# Patient Record
Sex: Male | Born: 1945 | Race: White | Hispanic: No | Smoking: Former smoker
Health system: Southern US, Community
[De-identification: ages and names within clinical notes are randomized; demographics above are authoritative.]

## PROBLEM LIST (undated history)

## (undated) DIAGNOSIS — M797 Fibromyalgia: Secondary | ICD-10-CM

## (undated) DIAGNOSIS — K219 Gastro-esophageal reflux disease without esophagitis: Secondary | ICD-10-CM

## (undated) DIAGNOSIS — E785 Hyperlipidemia, unspecified: Secondary | ICD-10-CM

## (undated) DIAGNOSIS — I10 Essential (primary) hypertension: Secondary | ICD-10-CM

## (undated) DIAGNOSIS — G4733 Obstructive sleep apnea (adult) (pediatric): Secondary | ICD-10-CM

## (undated) DIAGNOSIS — K589 Irritable bowel syndrome without diarrhea: Secondary | ICD-10-CM

## (undated) DIAGNOSIS — R7309 Other abnormal glucose: Secondary | ICD-10-CM

## (undated) DIAGNOSIS — E291 Testicular hypofunction: Secondary | ICD-10-CM

## (undated) DIAGNOSIS — N400 Enlarged prostate without lower urinary tract symptoms: Secondary | ICD-10-CM

## (undated) DIAGNOSIS — C801 Malignant (primary) neoplasm, unspecified: Secondary | ICD-10-CM

## (undated) HISTORY — DX: Hyperlipidemia, unspecified: E78.5

## (undated) HISTORY — DX: Gastro-esophageal reflux disease without esophagitis: K21.9

## (undated) HISTORY — DX: Benign prostatic hyperplasia without lower urinary tract symptoms: N40.0

## (undated) HISTORY — DX: Other abnormal glucose: R73.09

## (undated) HISTORY — DX: Obstructive sleep apnea (adult) (pediatric): G47.33

## (undated) HISTORY — PX: COLONOSCOPY: SHX174

## (undated) HISTORY — DX: Testicular hypofunction: E29.1

## (undated) HISTORY — DX: Fibromyalgia: M79.7

## (undated) HISTORY — DX: Malignant (primary) neoplasm, unspecified: C80.1

## (undated) HISTORY — DX: Essential (primary) hypertension: I10

## (undated) HISTORY — DX: Irritable bowel syndrome, unspecified: K58.9

---

## 1981-10-06 HISTORY — PX: VASECTOMY: SHX75

## 1983-10-07 HISTORY — PX: OTHER SURGICAL HISTORY: SHX169

## 1997-10-06 HISTORY — PX: KNEE ARTHROSCOPY: SHX127

## 2002-06-08 ENCOUNTER — Encounter: Payer: Self-pay | Admitting: Internal Medicine

## 2002-06-08 ENCOUNTER — Ambulatory Visit (HOSPITAL_COMMUNITY): Admission: RE | Admit: 2002-06-08 | Discharge: 2002-06-08 | Payer: Self-pay | Admitting: Internal Medicine

## 2002-06-13 ENCOUNTER — Encounter: Payer: Self-pay | Admitting: Internal Medicine

## 2002-06-13 ENCOUNTER — Ambulatory Visit (HOSPITAL_COMMUNITY): Admission: RE | Admit: 2002-06-13 | Discharge: 2002-06-13 | Payer: Self-pay | Admitting: Internal Medicine

## 2002-09-07 ENCOUNTER — Ambulatory Visit (HOSPITAL_COMMUNITY): Admission: RE | Admit: 2002-09-07 | Discharge: 2002-09-07 | Payer: Self-pay | Admitting: Gastroenterology

## 2002-12-09 ENCOUNTER — Encounter: Payer: Self-pay | Admitting: Internal Medicine

## 2002-12-09 ENCOUNTER — Ambulatory Visit (HOSPITAL_COMMUNITY): Admission: RE | Admit: 2002-12-09 | Discharge: 2002-12-09 | Payer: Self-pay | Admitting: Internal Medicine

## 2003-10-30 ENCOUNTER — Encounter: Admission: RE | Admit: 2003-10-30 | Discharge: 2003-10-30 | Payer: Self-pay | Admitting: Cardiology

## 2003-11-02 ENCOUNTER — Ambulatory Visit (HOSPITAL_COMMUNITY): Admission: RE | Admit: 2003-11-02 | Discharge: 2003-11-02 | Payer: Self-pay | Admitting: Cardiology

## 2005-10-06 HISTORY — PX: SPINE SURGERY: SHX786

## 2006-06-12 ENCOUNTER — Emergency Department (HOSPITAL_COMMUNITY): Admission: EM | Admit: 2006-06-12 | Discharge: 2006-06-12 | Payer: Self-pay | Admitting: Emergency Medicine

## 2006-06-16 ENCOUNTER — Ambulatory Visit (HOSPITAL_COMMUNITY): Admission: RE | Admit: 2006-06-16 | Discharge: 2006-06-16 | Payer: Self-pay | Admitting: Internal Medicine

## 2006-06-22 ENCOUNTER — Ambulatory Visit (HOSPITAL_COMMUNITY): Admission: RE | Admit: 2006-06-22 | Discharge: 2006-06-23 | Payer: Self-pay | Admitting: Neurosurgery

## 2008-09-28 ENCOUNTER — Emergency Department (HOSPITAL_COMMUNITY): Admission: EM | Admit: 2008-09-28 | Discharge: 2008-09-28 | Payer: Self-pay | Admitting: Emergency Medicine

## 2009-08-27 ENCOUNTER — Ambulatory Visit: Payer: Self-pay | Admitting: Physical Medicine & Rehabilitation

## 2009-08-27 ENCOUNTER — Encounter
Admission: RE | Admit: 2009-08-27 | Discharge: 2009-09-26 | Payer: Self-pay | Admitting: Physical Medicine & Rehabilitation

## 2009-09-10 ENCOUNTER — Encounter
Admission: RE | Admit: 2009-09-10 | Discharge: 2009-10-03 | Payer: Self-pay | Admitting: Physical Medicine & Rehabilitation

## 2009-09-13 ENCOUNTER — Ambulatory Visit: Payer: Self-pay | Admitting: Physical Medicine & Rehabilitation

## 2009-10-08 ENCOUNTER — Encounter
Admission: RE | Admit: 2009-10-08 | Discharge: 2009-10-20 | Payer: Self-pay | Admitting: Physical Medicine & Rehabilitation

## 2009-10-11 ENCOUNTER — Encounter
Admission: RE | Admit: 2009-10-11 | Discharge: 2010-01-09 | Payer: Self-pay | Admitting: Physical Medicine & Rehabilitation

## 2009-10-12 ENCOUNTER — Ambulatory Visit: Payer: Self-pay | Admitting: Physical Medicine & Rehabilitation

## 2009-11-09 ENCOUNTER — Ambulatory Visit: Payer: Self-pay | Admitting: Physical Medicine & Rehabilitation

## 2010-01-08 ENCOUNTER — Encounter
Admission: RE | Admit: 2010-01-08 | Discharge: 2010-01-08 | Payer: Self-pay | Admitting: Physical Medicine & Rehabilitation

## 2010-01-14 ENCOUNTER — Ambulatory Visit: Payer: Self-pay | Admitting: Physical Medicine & Rehabilitation

## 2010-04-12 ENCOUNTER — Encounter
Admission: RE | Admit: 2010-04-12 | Discharge: 2010-04-12 | Payer: Self-pay | Admitting: Physical Medicine & Rehabilitation

## 2011-02-21 NOTE — Op Note (Signed)
   NAME:  Cody Hines, Cody Hines                        ACCOUNT NO.:  [id number]   MEDICAL RECORD NO.:  [id number]                   PATIENT TYPE:  AMB   LOCATION:  ENDO                                 FACILITY:  [facility]   PHYSICIAN:  John C. Madilyn Fireman, M.D.                 DATE OF BIRTH:  1946-05-21   DATE OF PROCEDURE:  DATE OF DISCHARGE:                                 OPERATIVE REPORT   PROCEDURE:  Colonoscopy.   INDICATIONS FOR PROCEDURE:  Heme positive stools and occasional rectal  bleeding.   DESCRIPTION OF PROCEDURE:  The patient was placed in the left lateral  decubitus position then placed on the pulse monitor with continuous low flow  oxygen delivered by nasal cannula. He was sedated with 62.5 mcg IV fentanyl  and 6 mg IV Versed. The Olympus video colonoscope was inserted into the  rectum and advanced to the cecum, confirmed by transillumination at  McBurney's point and visualization of the ileocecal valve and appendiceal  orifice. The prep was excellent. The cecum, ascending, transverse,  descending and sigmoid colon all appeared normal with no masses, polyps,  diverticula or other mucosal abnormalities. The rectum likewise appeared  normal and retroflexed view of the anus did reveal some small internal  hemorrhoids. The colonoscope was then withdrawn and the patient returned to  the recovery room in stable condition. The patient tolerated the procedure  well and there were no immediate complications.   IMPRESSION:  Internal hemorrhoids, otherwise, normal colonoscopy.   PLAN:  1. Treat hemorrhoids symptomatically.  2. Flexible sigmoidoscopy in five years.                                                John C. Madilyn Fireman, M.D.    JCH/MEDQ  D:  09/07/2002  T:  09/07/2002  Job:  [id number]   cc:   Lucky Cowboy, M.D.  [address], [address]  [place], [place] [address]  Fax: [phone]

## 2011-02-21 NOTE — Cardiovascular Report (Signed)
NAME:  Cody Hines, MORIMOTO                        ACCOUNT NO.:  [id number]   MEDICAL RECORD NO.:  [id number]                   PATIENT TYPE:  OIB   LOCATION:  2861                                 FACILITY:  [facility]   PHYSICIAN:  Thereasa Solo. Little, M.D.              DATE OF BIRTH:  August 03, 1946   DATE OF PROCEDURE:  11/02/2003  DATE OF DISCHARGE:                              CARDIAC CATHETERIZATION   PROCEDURES PERFORMED:   CARDIOLOGIST:  Gaspar Garbe B. Little, M.D.   INDICATIONS FOR PROCEDURE:  Mr. Labrosse is a 65 year old male who has had  exertional chest tightness and increasing fatigue.  He had a Cardiolite  study performed at [facility] that was read by Dr. Dietrich Pates.  It  showed normal LV function and a small area of basilar inferior wall  ischemia.  Because of his symptoms and this abnormality on his nuclear study  arrangements were made for cardiac catheterization.   DESCRIPTION OF PROCEDURE:  After obtaining informed consent the patient was  prepped and draped in the usual sterile fashion exposing the right groin.  Following local anesthetic with 1% Xylocaine th Seldinger technique was  employed and a 5 [place] introducer sheath was placed into the right femoral  artery.  Left and right coronary arteriography and ventriculography in the  RAO projection were performed.   COMPLICATIONS:  None.   CONTRAST USED:  Total contrast; 125 mL.   EQUIPMENT:  Five French Judkins configuration catheters.   RESULTS:   I HEMODYNAMIC MONITORING:  Central aortic pressure was 105/63.  Left  ventricular pressure was 106/7.  No significant gradient was noted at the  time of pullback across the aortic valve.   II VENTRICULOGRAPHY:  Ventriculography in the RAO projection using 25 mL of  contrast at 12 mL per second revealed normal LV systolic function.  The  ejection fraction was greater than 65%.  There was mitral valve prolapse  without mitral regurgitation.  The left ventricular end  diastolic pressure  was normal at 14.   III CORONARY ARTERIOGRAPHY:  On fluoroscopy no calcification was noted.   1. Left Main:  The left main was short and normal.   1. Circumflex:  The circumflex was a dominant left system that supplied the     PDA.  The entire vessel was around 4 mm in diameter.  It gave rise to a     very large first and third OM vessel, and a medium-sized OM #2.  All     these vessels were free of disease.  The posterior descending artery     supplied the entire inferior wall; was free of disease and was about 3 mm     in diameter.   1. LAD:  The LAD crossed the apex of the heart.  It had only minimal     irregularities in the proximal segment and was free of any significant     disease.  There was a large first diagonal branch that bifurcated and the     entire LAD system was about 3.5 mm in diameter.   1. Right Coronary Artery:  A small, nondominant vessel just supplying the     right ventricular free wall.   CONCLUSIONS:  1. Mitral valve prolapse.  2. Normal left ventricular systolic function.  3. No coronary artery disease.   DISCUSSION:  It appears that the stress test is a false positive study.  I  cannot explain any ischemia on the nuclear study based on his cardiac  anatomy.  He does have mitral valve prolapse, which could explain the EKG  changes that occurred with the stress test.                                               Thereasa Solo. Little, M.D.    ABL/MEDQ  D:  11/02/2003  T:  11/04/2003  Job:  [id number]   cc:   Lucky Cowboy, M.D.  [address], [address]  [place], [place] [address]  Fax: [phone]

## 2011-02-21 NOTE — Op Note (Signed)
NAME:  Cody Hines, Cody Hines NO.:  [id number]   MEDICAL RECORD NO.:  [id number]          PATIENT TYPE:  OIB   LOCATION:  3014                         FACILITY:  [facility]   PHYSICIAN:  Cristi Loron, M.D.DATE OF BIRTH:  1946-04-21   DATE OF PROCEDURE:  06/22/2006  DATE OF DISCHARGE:  06/23/2006                                 OPERATIVE REPORT   BRIEF HISTORY:  The patient is a 65 year old white male who suffers from  severe back and right leg pain consistent with a right S1 radiculopathy.  He  failed medical management; and was worked up with a lumbar MRI which  demonstrated a herniated disk at L5-S1 on the right.  I discussed the  various treatment options with the patient including surgery.  The patient  has weighed the risks, benefits, and alternatives of surgery and has decided  to proceed with a right L5-S1 microdiskectomy.   PREOPERATIVE DIAGNOSIS:  1. Right L5-S1 herniated nucleus pulposus.  2. Spinal stenosis.  3. Lumbar radiculopathy.  4. Lumbago.   POSTOPERATIVE DIAGNOSES:  1. Right L5-S1 herniated nucleus pulposus.  2. Spinal stenosis.  3. Lumbar radiculopathy.  4. Lumbago.   PROCEDURE:  Right L5-S1 microdiskectomy using microdissection.   SURGEON:  Cristi Loron, M.D.   ASSISTANT:  Clydene Fake, M.D.   ANESTHESIA:  General endotracheal.   ESTIMATED BLOOD LOSS:  50 mL.   SPECIMENS:  None.   DRAINS:  None.   COMPLICATIONS:  None.   DESCRIPTION OF PROCEDURE:  The patient was brought to the operating room by  the anesthesia team and general endotracheal anesthesia was induced.  The  patient was turned to the prone position on the Wilson frame, and his  lumbosacral region was then shaved with the clippers; and prepared with  Betadine scrub and Betadine solution; sterile drapes were applied.  I then  injected the area to be incised with Marcaine with epinephrine solution.  I  used a scalpel to make a linear midline incision over the  L5-S1 interspace.  I used electrocautery to perform a right-sided subperiosteal dissection,  exposing the right spinous process of the lamina of L5 and the upper sacrum.   I then obtained intraoperative radiograph to confirm our location; and then  inserted the [facility] retractor for exposure.  I then brought the  operative microscope into the field; and under its magnification and  illumination completed the microdissection/decompression.  I used a high-  speed drill to perform a right L5 laminotomy.  I widened the laminotomy with  the Kerrison punch, removing the right L5-S1 ligamentum flavum.  I performed  a foraminotomy about the right S1 nerve root.  I then used microdissection  to free up the thecal sac and the S1 nerve root from the epidural tissue;  and Dr. Phoebe Perch gently retracted these structures medially with the [facility]  retractor.  This exposed a large free fragment of disk herniation.  I  removed it in multiple pieces using the micro pituitary forceps.  I then  inspected the intervertebral disk at L5-S1.  There was some bulging at the  annulus,  but no impending herniation.  I therefore did not violate the  intervertebral disk space.  I used electrocautery to perform hemostasis; and  then irrigated the wound out with bacitracin solution.   I then used a coronary dilator to palpated along the ventral surface of the  thecal sac and along the extradural route of the left S1 nerve root; and  noted that the neural structure were well decompressed.  We then removed the  [facility] retractor; and then reapproximated the patient's thoracolumbar  fascia with interrupted #1 Vicryl suture; subcutaneous tissue with  interrupted 2-0 Vicryl suture; and the skin with Steri-Strips and Benzoin.  The wound was then coated with bacitracin ointment, and sterile dressing  applied; and the drapes were removed.  The patient was subsequently returned  to the supine position where he was extubated by  the anesthesia team, and  transported to post anesthesia care unit in stable condition.  All sponge,  instrument, and needle counts were correct in this case.      Cristi Loron, M.D.  Electronically Signed     JDJ/MEDQ  D:  06/23/2006  T:  06/24/2006  Job:  [id number]

## 2011-02-21 NOTE — Op Note (Signed)
NAME:  Cody Hines, Cody Hines                        ACCOUNT NO.:  [id number]   MEDICAL RECORD NO.:  [id number]                   PATIENT TYPE:  AMB   LOCATION:  ENDO                                 FACILITY:  [facility]   PHYSICIAN:  John C. Madilyn Fireman, M.D.                 DATE OF BIRTH:  May 07, 1946   DATE OF PROCEDURE:  09/07/2002  DATE OF DISCHARGE:                                 OPERATIVE REPORT   PROCEDURE:  Esophagogastroduodenoscopy with biopsy.   INDICATIONS FOR PROCEDURE:  Heme positive stools and reported history of  peptic ulcer disease, colonoscopy prior to this procedure revealed only  small internal hemorrhoids.   DESCRIPTION OF PROCEDURE:  The patient was placed in the left lateral  decubitus position then placed on the pulse monitor with continuous low flow  oxygen delivered by nasal cannula. He was sedated with 1 mg IV Versed in  addition to the medicine received for the previous colonoscopy. The Olympus  video endoscope was advanced under direct vision into the oropharynx and  esophagus. The esophagus was straight and of normal caliber with the  squamocolumnar line at 38 cm. There was no visible hiatal hernia,  esophagitis, ring, stricture or other abnormality of the GE junction. The  stomach was entered and a small amount of liquid secretions were suctioned  from the fundus. Retroflexed view of the cardia was unremarkable. The fundus  and body appeared normal. The antrum showed several areas of focal erythema  and granularity with some erosions with a small amount of exudate no more  than 2 mm in diameter and no significant depth to meet the criteria for  ulcers. A CLOtest was obtained from the antral mucosa. The pylorus was  nondeformed and easily allowed passage of the endoscope tip into the  duodenum. There was some erythema and granularity of the bulb with no focal  erosions or exudate. The postbulbar duodenum appeared normal. The scope was  then withdrawn and the patient  returned to the recovery room in stable  condition. He tolerated the procedure well and there were no immediate  complications.   IMPRESSION:  1. Several antral erosions with exudate.  2. Mild to moderate duodenitis.   PLAN:  Await CLOtest and treat for eradication of helicobacter if positive.  Will recommend avoidance of nonsteroidal antiinflammatory drugs and will  probably need at least intermittent chronic antisuppressive therapy based on  symptoms.                                                John C. Madilyn Fireman, M.D.    JCH/MEDQ  D:  09/07/2002  T:  09/07/2002  Job:  [id number]   cc:   Lucky Cowboy, M.D.  [address], [address]  [place], [place] [address]  Fax:  [phone]  

## 2011-07-11 LAB — POCT I-STAT, CHEM 8
BUN: 16 mg/dL (ref 6–23)
Calcium, Ion: 1.1 mmol/L — ABNORMAL LOW (ref 1.12–1.32)
Chloride: 102 mEq/L (ref 96–112)
Creatinine, Ser: 1.6 mg/dL — ABNORMAL HIGH (ref 0.4–1.5)
Glucose, Bld: 118 mg/dL — ABNORMAL HIGH (ref 70–99)
HCT: 50 % (ref 39.0–52.0)
Hemoglobin: 17 g/dL (ref 13.0–17.0)
Potassium: 3.8 mEq/L (ref 3.5–5.1)
Sodium: 138 mEq/L (ref 135–145)
TCO2: 24 mmol/L (ref 0–100)

## 2011-07-11 LAB — CBC
HCT: 47.5 % (ref 39.0–52.0)
Hemoglobin: 16 g/dL (ref 13.0–17.0)
MCHC: 33.7 g/dL (ref 30.0–36.0)
MCV: 88.9 fL (ref 78.0–100.0)
Platelets: 253 10*3/uL (ref 150–400)
RBC: 5.34 MIL/uL (ref 4.22–5.81)
RDW: 13.7 % (ref 11.5–15.5)
WBC: 9.8 10*3/uL (ref 4.0–10.5)

## 2011-07-11 LAB — PROTIME-INR
INR: 1 (ref 0.00–1.49)
Prothrombin Time: 13.9 seconds (ref 11.6–15.2)

## 2013-04-22 LAB — HM COLONOSCOPY

## 2013-07-15 ENCOUNTER — Encounter: Payer: Self-pay | Admitting: Physician Assistant

## 2013-07-15 DIAGNOSIS — M797 Fibromyalgia: Secondary | ICD-10-CM | POA: Insufficient documentation

## 2013-07-15 DIAGNOSIS — R7309 Other abnormal glucose: Secondary | ICD-10-CM | POA: Insufficient documentation

## 2013-07-15 DIAGNOSIS — K219 Gastro-esophageal reflux disease without esophagitis: Secondary | ICD-10-CM | POA: Insufficient documentation

## 2013-07-15 DIAGNOSIS — G4733 Obstructive sleep apnea (adult) (pediatric): Secondary | ICD-10-CM | POA: Insufficient documentation

## 2013-07-15 DIAGNOSIS — I10 Essential (primary) hypertension: Secondary | ICD-10-CM | POA: Insufficient documentation

## 2013-07-15 DIAGNOSIS — E349 Endocrine disorder, unspecified: Secondary | ICD-10-CM | POA: Insufficient documentation

## 2013-07-15 DIAGNOSIS — N400 Enlarged prostate without lower urinary tract symptoms: Secondary | ICD-10-CM | POA: Insufficient documentation

## 2013-07-15 DIAGNOSIS — K589 Irritable bowel syndrome without diarrhea: Secondary | ICD-10-CM | POA: Insufficient documentation

## 2013-07-15 DIAGNOSIS — E785 Hyperlipidemia, unspecified: Secondary | ICD-10-CM | POA: Insufficient documentation

## 2013-08-09 ENCOUNTER — Encounter (INDEPENDENT_AMBULATORY_CARE_PROVIDER_SITE_OTHER): Payer: Self-pay

## 2013-08-09 ENCOUNTER — Ambulatory Visit: Payer: Medicare Other | Admitting: Internal Medicine

## 2013-08-09 ENCOUNTER — Encounter: Payer: Self-pay | Admitting: Internal Medicine

## 2013-08-09 VITALS — BP 132/74 | HR 64 | Temp 97.9°F | Resp 18 | Wt 223.8 lb

## 2013-08-09 DIAGNOSIS — I10 Essential (primary) hypertension: Secondary | ICD-10-CM

## 2013-08-09 DIAGNOSIS — E559 Vitamin D deficiency, unspecified: Secondary | ICD-10-CM

## 2013-08-09 DIAGNOSIS — Z23 Encounter for immunization: Secondary | ICD-10-CM

## 2013-08-09 DIAGNOSIS — Z79899 Other long term (current) drug therapy: Secondary | ICD-10-CM

## 2013-08-09 DIAGNOSIS — R7309 Other abnormal glucose: Secondary | ICD-10-CM

## 2013-08-09 DIAGNOSIS — E782 Mixed hyperlipidemia: Secondary | ICD-10-CM

## 2013-08-09 LAB — CBC WITH DIFFERENTIAL/PLATELET
Basophils Relative: 0 % (ref 0–1)
Eosinophils Absolute: 0.1 10*3/uL (ref 0.0–0.7)
Eosinophils Relative: 1 % (ref 0–5)
Hemoglobin: 16 g/dL (ref 13.0–17.0)
Lymphs Abs: 2.6 10*3/uL (ref 0.7–4.0)
MCH: 30.5 pg (ref 26.0–34.0)
MCHC: 34.7 g/dL (ref 30.0–36.0)
MCV: 88 fL (ref 78.0–100.0)
Monocytes Absolute: 0.6 10*3/uL (ref 0.1–1.0)
Monocytes Relative: 7 % (ref 3–12)
Neutrophils Relative %: 59 % (ref 43–77)
RBC: 5.24 MIL/uL (ref 4.22–5.81)

## 2013-08-09 LAB — HEPATIC FUNCTION PANEL
Alkaline Phosphatase: 51 U/L (ref 39–117)
Bilirubin, Direct: 0.2 mg/dL (ref 0.0–0.3)
Indirect Bilirubin: 0.6 mg/dL (ref 0.0–0.9)
Total Protein: 7.5 g/dL (ref 6.0–8.3)

## 2013-08-09 LAB — HEMOGLOBIN A1C
Hgb A1c MFr Bld: 5.8 % — ABNORMAL HIGH (ref <5.7)
Mean Plasma Glucose: 120 mg/dL — ABNORMAL HIGH (ref <117)

## 2013-08-09 LAB — BASIC METABOLIC PANEL WITH GFR
Calcium: 10.4 mg/dL (ref 8.4–10.5)
Creat: 1.32 mg/dL (ref 0.50–1.35)
GFR, Est African American: 65 mL/min
Glucose, Bld: 101 mg/dL — ABNORMAL HIGH (ref 70–99)
Sodium: 142 mEq/L (ref 135–145)

## 2013-08-09 LAB — LIPID PANEL
Cholesterol: 191 mg/dL (ref 0–200)
LDL Cholesterol: 98 mg/dL (ref 0–99)
Triglycerides: 192 mg/dL — ABNORMAL HIGH (ref <150)

## 2013-08-09 NOTE — Progress Notes (Signed)
Patient ID: Cody Hines, male   DOB: October 20, 1945, 67 y.o.   MRN: [id number] HPI: Patient presents for 3 month follow up with hypertension, hyperlipidemia, pre-diabetes and vitamin D deficiency.  BP has/has not been controlled at home, today's BP. Patient denies chest pain, palpitations, dyspnea/orthopnea/PND, dizziness, claudication, or edema. Dyslipidemia is controlled with diet & is on meds. Denies myalgias w/o med SE's. His/her last cholesterol was No results found for this basename: chol, trig, hdl, ldlcalc   Also; patient has prediabetes/insulin resistance with last A1c of hga1c 5.6%    . Denies diabetic polys, paresthesias, or visual blurring. Hx/o vitamin D deficiency with pt supplementing same  Current outpatient prescriptions:ALPRAZolam (XANAX) 0.5 MG tablet, Take 0.5 mg by mouth at bedtime as needed for sleep., Disp: , Rfl: ;  aspirin EC 81 MG tablet, Take 81 mg by mouth daily., Disp: , Rfl: ;  buPROPion (WELLBUTRIN XL) 300 MG 24 hr tablet, Take 300 mg by mouth daily., Disp: , Rfl: ;  enalapril (VASOTEC) 20 MG tablet, Take 20 mg by mouth daily., Disp: , Rfl:  Flaxseed, Linseed, (FLAX SEED OIL) 1000 MG CAPS, Take 1 capsule by mouth 1 day or 1 dose., Disp: , Rfl: ;  rosuvastatin (CRESTOR) 20 MG tablet, Take 10 mg by mouth daily., Disp: , Rfl: ;  Vitamin D, Ergocalciferol, (DRISDOL) 50000 UNITS CAPS capsule, Take 50,000 Units by mouth 1 day or 1 dose., Disp: , Rfl:   Asa; Codeine; Penicillins; Savella; and Viagra Systems Review: Constitutional: Denies fever, chills, wt down 3 lbs w/dieting,no headaches, fatigue, change in appetite. Eyes: Denies redness, blurred vision, diplopia, discharge, itchy, watery eyes.  ENT: Denies discharge, congestion, post nasal drip, epistaxis, sore throat, earache, hearing loss, dental pain, Tinnitus, Vertigo, Sinus pain, snoring.  CV: Denies chest pain, palpitations, irregular heartbeat, syncope, dyspnea, diaphoresis, orthopnea, PND, claudication,  edema Respiratory: denies cough, dyspnea, DOE, pleurisy, hoarseness, laryngitis, wheezing.  Gastrointestinal: Denies dysphagia, oodynophagia, heartburn, reflux, waterbrash, pain, cramps, nausea, vomiting, bloating, diarrhea, constipation, hematemesis, melena, hematochezia, jaundice, hemorrhoids Genitourinary: Denies dysuria, frequency, urgency, nocturia, hesistancy, discharge, hematuria, flank pain Musculoskeletal: Has ongoing arthralgias, myalgias, stiffness No Jt. Swelling, pain, limp, strain/sprain.  Skin: Denies pruritus, rash, hives, warts, acne, eczema, change in in skin lesion(s) Neuro: No weakness, tremor, incoordination, spasms, paresthesia, pain Psychiatric: Denies confusion, memory loss, sensory loss Endo: Denies skin, hair change, nocturia, diabetic polys, paresthesias, visual blurring, hyper/hypo-glycemic episodes.  Heme/Lymph: No excessive bleeding, bruising, enlarged lymph nodes PMHx: FHx: Reviewed / unchanged SHx: Reviewed / unchanged Filed Vitals:   08/09/13 1146  BP: 132/74  Pulse: 64  Temp: 97.9 F (36.6 C)  Resp: 18   On Exam: Appears well nourished - in NAD. Eyes: PERRLA, EOMs, conjunctiva no swelling or erythema, normal fundi and vessels. Sinuses: No Frontal/maxillary tenderness ENT/Mouth: EACs clear, TMs nl w/o erythema, bulging. Nares clear w/o erythema, swelling, exudates. OroPharynx clear. No ulcers, cracking, on lips. Dentition- unremarkable. No erythema. Tongue normal, non obstructing. Tonsils not swollen or erythematous. Hearing intact.  Neck: Supple. Car 2+/2+. Thyroid nl. No LN, bruits or JVD. Chest: Respirations nl, BS equal w/o  rales, rhonci, wheezing or stridor.  Cardio: Heart sounds normal, regular rate and rhythm without sig. murmurs, gallops,clicks, or rubs. Peripheral pulses brisk and equal bilaterally, without edema.  Abdomen: Flat, soft, with bowl sounds. Non-tender,  no guarding, rebound, hernias, masses, or organomegaly.  Musculoskeletal: Full  ROM all peripheral extremities, joint stability, 5/5 strength, and normal gait.  Skin: Warm, dry without rashes, lesions, ecchymosis.  Neuro:  Cranial nerves intact, reflexes equal bilaterally. Sensory-motor testing grossly intact. Tendon reflexes grossly intact.  Pysch: Alert & oriented x 3. Insight and judgement nl & appropriate. No ideations Assessment and Plan: 1. Hypertension: Continue medication, monitor blood pressure at home. Continue diet/meds. 2. Cholesterol: Continue diet/meds, exercise,& lifestyle modifications. Continue monitor cholesterol/LFTs.  3. Pre-diabetes/Diabesity- Continue diet, exercise, lifestyle modifications. Monitor appropriate labs. 4. Vitamin D Deficiency

## 2013-08-09 NOTE — Patient Instructions (Signed)
Diet and meds as discussed. Further disposition pending labs

## 2013-08-10 ENCOUNTER — Telehealth: Payer: Self-pay | Admitting: *Deleted

## 2013-08-10 NOTE — Telephone Encounter (Signed)
Message copied by Reggy Eye on Wed Aug 10, 2013 11:25 AM ------      Message from: Lucky Cowboy      Created: Wed Aug 10, 2013  9:00 AM       All labs OK - continue diet & meds same ------

## 2013-08-10 NOTE — Telephone Encounter (Signed)
lmom to call me about labs

## 2013-08-12 NOTE — Telephone Encounter (Signed)
Pt aware of labs  

## 2013-08-30 DIAGNOSIS — L570 Actinic keratosis: Secondary | ICD-10-CM | POA: Diagnosis not present

## 2013-08-30 DIAGNOSIS — C44611 Basal cell carcinoma of skin of unspecified upper limb, including shoulder: Secondary | ICD-10-CM | POA: Diagnosis not present

## 2013-09-20 ENCOUNTER — Other Ambulatory Visit: Payer: Self-pay

## 2013-09-20 MED ORDER — ROSUVASTATIN CALCIUM 20 MG PO TABS: ORAL_TABLET | ORAL | Status: DC

## 2013-10-18 DIAGNOSIS — L578 Other skin changes due to chronic exposure to nonionizing radiation: Secondary | ICD-10-CM | POA: Diagnosis not present

## 2013-10-18 DIAGNOSIS — Z85828 Personal history of other malignant neoplasm of skin: Secondary | ICD-10-CM | POA: Diagnosis not present

## 2013-11-08 ENCOUNTER — Ambulatory Visit: Payer: Self-pay | Admitting: Physician Assistant

## 2013-11-08 ENCOUNTER — Encounter: Payer: Self-pay | Admitting: Emergency Medicine

## 2013-11-08 ENCOUNTER — Ambulatory Visit (INDEPENDENT_AMBULATORY_CARE_PROVIDER_SITE_OTHER): Payer: Medicare Other | Admitting: Emergency Medicine

## 2013-11-08 VITALS — BP 122/80 | HR 64 | Temp 98.4°F | Resp 18 | Ht 73.0 in | Wt 226.0 lb

## 2013-11-08 DIAGNOSIS — E782 Mixed hyperlipidemia: Secondary | ICD-10-CM | POA: Diagnosis not present

## 2013-11-08 DIAGNOSIS — R7309 Other abnormal glucose: Secondary | ICD-10-CM | POA: Diagnosis not present

## 2013-11-08 DIAGNOSIS — E559 Vitamin D deficiency, unspecified: Secondary | ICD-10-CM

## 2013-11-08 DIAGNOSIS — I10 Essential (primary) hypertension: Secondary | ICD-10-CM

## 2013-11-08 LAB — CBC WITH DIFFERENTIAL/PLATELET
BASOS ABS: 0 10*3/uL (ref 0.0–0.1)
Basophils Relative: 0 % (ref 0–1)
Eosinophils Absolute: 0.1 10*3/uL (ref 0.0–0.7)
Eosinophils Relative: 2 % (ref 0–5)
HEMATOCRIT: 46.2 % (ref 39.0–52.0)
Hemoglobin: 15.9 g/dL (ref 13.0–17.0)
LYMPHS PCT: 35 % (ref 12–46)
Lymphs Abs: 2.3 10*3/uL (ref 0.7–4.0)
MCH: 30.6 pg (ref 26.0–34.0)
MCHC: 34.4 g/dL (ref 30.0–36.0)
MCV: 89 fL (ref 78.0–100.0)
MONO ABS: 0.5 10*3/uL (ref 0.1–1.0)
Monocytes Relative: 7 % (ref 3–12)
NEUTROS ABS: 3.8 10*3/uL (ref 1.7–7.7)
Neutrophils Relative %: 56 % (ref 43–77)
Platelets: 209 10*3/uL (ref 150–400)
RBC: 5.19 MIL/uL (ref 4.22–5.81)
RDW: 13.9 % (ref 11.5–15.5)
WBC: 6.7 10*3/uL (ref 4.0–10.5)

## 2013-11-08 LAB — HEMOGLOBIN A1C
Hgb A1c MFr Bld: 6 % — ABNORMAL HIGH (ref <5.7)
MEAN PLASMA GLUCOSE: 126 mg/dL — AB (ref <117)

## 2013-11-08 NOTE — Patient Instructions (Signed)

## 2013-11-08 NOTE — Progress Notes (Signed)
Subjective:    Patient ID: Cody Hines, male    DOB: 1945/11/28, 68 y.o.   MRN: [id number]  HPI Comments: 68 yo presents for 3 month F/U for HTN, Cholesterol, Pre-Dm, D. Deficient. He does not check BS at home. He had recent derm visit with more areas of skin treated. He has not been eating as healthy with the holidays. He exercising at work 3 x week. He stays active with grand-babies. His BP has good at home.  LAST LABS WBC             6.7   11/08/2013 HGB            15.9   11/08/2013 HCT            46.2   11/08/2013 PLT             209   11/08/2013 GLUCOSE          82   11/08/2013 CHOL            159   11/08/2013 TRIG             78   11/08/2013 HDL              65   11/08/2013 LDLCALC          78   11/08/2013 ALT              27   11/08/2013 AST              25   11/08/2013 NA              141   11/08/2013 K               4.4   11/08/2013 CL              102   11/08/2013 CREATININE     0.72   11/08/2013 BUN               8   11/08/2013 CO2              24   11/08/2013 INR             1.0   09/28/2008 HGBA1C          6.0   11/08/2013 Vit d 65 He has been under mild stress increase with father in laws recent illness but he feels he is managing it well. He denies any RX need change.  Hyperlipidemia  Hypertension   Current Outpatient Prescriptions on File Prior to Visit  Medication Sig Dispense Refill  . ALPRAZolam (XANAX) 0.5 MG tablet Take 0.5 mg by mouth at bedtime as needed for sleep.      Marland Kitchen aspirin EC 81 MG tablet Take 81 mg by mouth daily.      Marland Kitchen buPROPion (WELLBUTRIN XL) 300 MG 24 hr tablet Take 300 mg by mouth daily.      . enalapril (VASOTEC) 20 MG tablet Take 20 mg by mouth daily.      . Flaxseed, Linseed, (FLAX SEED OIL) 1000 MG CAPS Take 1 capsule by mouth 1 day or 1 dose.      . rosuvastatin (CRESTOR) 20 MG tablet Take 1/2 to one tablet daily  90 tablet  3  . Vitamin D, Ergocalciferol, (DRISDOL) 50000 UNITS CAPS capsule Take 50,000 Units by mouth 1 day or 1 dose.       No current  facility-administered medications on file  prior to visit.   ALLERGIES Asa; Celebrex; Codeine; Penicillins; Savella; and Viagra  Past Medical History  Diagnosis Date  . Hypertension   . Hyperlipidemia   . GERD (gastroesophageal reflux disease)   . Elevated hemoglobin A1c   . IBS (irritable bowel syndrome)   . OSA (obstructive sleep apnea)   . Fibromyalgia   . BPH (benign prostatic hyperplasia)   . Hypogonadism male      Review of Systems  All other systems reviewed and are negative.   BP 122/80  Pulse 64  Temp(Src) 98.4 F (36.9 C) (Temporal)  Resp 18  Ht 6\' 1"  (1.854 m)  Wt 226 lb (102.513 kg)  BMI 29.82 kg/m2     Objective:   Physical Exam  Nursing note and vitals reviewed. Constitutional: He is oriented to person, place, and time. He appears well-developed and well-nourished.  HENT:  Head: Normocephalic and atraumatic.  Right Ear: External ear normal.  Left Ear: External ear normal.  Nose: Nose normal.  Eyes: Conjunctivae and EOM are normal.  Neck: Normal range of motion. Neck supple. No JVD present. No thyromegaly present.  Cardiovascular: Normal rate, regular rhythm, normal heart sounds and intact distal pulses.   Pulmonary/Chest: Effort normal and breath sounds normal.  Abdominal: Soft. Bowel sounds are normal. He exhibits no distension and no mass. There is no tenderness. There is no rebound and no guarding.  Musculoskeletal: Normal range of motion. He exhibits no edema and no tenderness.  Lymphadenopathy:    He has no cervical adenopathy.  Neurological: He is alert and oriented to person, place, and time. He has normal reflexes. No cranial nerve deficit. Coordination normal.  Skin: Skin is warm and dry.  Bilateral forearms with erythematous patches/ scaling NO Change  Psychiatric: He has a normal mood and affect. His behavior is normal. Judgment and thought content normal.          Assessment & Plan:  1.  3 month F/U for HTN, Cholesterol, Pre-Dm, D.  Deficient. Needs healthy diet, cardio QD and obtain healthy weight. Check Labs, Check BP if >130/80 call office 2. Stress- managing well, w/c if SX increase or ER.

## 2013-11-09 LAB — HEPATIC FUNCTION PANEL
ALBUMIN: 3.7 g/dL (ref 3.5–5.2)
ALT: 27 U/L (ref 0–53)
AST: 25 U/L (ref 0–37)
Alkaline Phosphatase: 111 U/L (ref 39–117)
Bilirubin, Direct: 0.1 mg/dL (ref 0.0–0.3)
Indirect Bilirubin: 0.4 mg/dL (ref 0.2–1.2)
TOTAL PROTEIN: 6.1 g/dL (ref 6.0–8.3)
Total Bilirubin: 0.5 mg/dL (ref 0.2–1.2)

## 2013-11-09 LAB — LIPID PANEL
CHOLESTEROL: 159 mg/dL (ref 0–200)
HDL: 65 mg/dL (ref >39)
LDL Cholesterol: 78 mg/dL (ref 0–99)
Total CHOL/HDL Ratio: 2.4 Ratio
Triglycerides: 78 mg/dL (ref <150)
VLDL: 16 mg/dL (ref 0–40)

## 2013-11-09 LAB — BASIC METABOLIC PANEL WITH GFR
BUN: 8 mg/dL (ref 6–23)
CHLORIDE: 102 meq/L (ref 96–112)
CO2: 24 mEq/L (ref 19–32)
Calcium: 9.3 mg/dL (ref 8.4–10.5)
Creat: 0.72 mg/dL (ref 0.50–1.35)
GFR, Est African American: >89 mL/min
GLUCOSE: 82 mg/dL (ref 70–99)
POTASSIUM: 4.4 meq/L (ref 3.5–5.3)
Sodium: 141 mEq/L (ref 135–145)

## 2013-11-09 LAB — INSULIN, FASTING: Insulin fasting, serum: 32 u[IU]/mL — ABNORMAL HIGH (ref 3–28)

## 2013-12-30 ENCOUNTER — Other Ambulatory Visit: Payer: Self-pay | Admitting: Internal Medicine

## 2014-02-07 DIAGNOSIS — L578 Other skin changes due to chronic exposure to nonionizing radiation: Secondary | ICD-10-CM | POA: Diagnosis not present

## 2014-02-07 DIAGNOSIS — Z85828 Personal history of other malignant neoplasm of skin: Secondary | ICD-10-CM | POA: Diagnosis not present

## 2014-02-13 ENCOUNTER — Encounter: Payer: Self-pay | Admitting: Internal Medicine

## 2014-03-16 ENCOUNTER — Other Ambulatory Visit: Payer: Self-pay | Admitting: Internal Medicine

## 2014-05-03 DIAGNOSIS — E559 Vitamin D deficiency, unspecified: Secondary | ICD-10-CM | POA: Insufficient documentation

## 2014-05-03 DIAGNOSIS — Z79899 Other long term (current) drug therapy: Secondary | ICD-10-CM | POA: Insufficient documentation

## 2014-05-03 NOTE — Progress Notes (Signed)
Patient ID: Cody Hines, male   DOB: [birth year], 68 y.o.   MRN: [id number]   Annual Screening Comprehensive Examination  This very nice 68 y.o.male presents for complete physical.  Patient has been followed for HTN, Diabetes  Prediabetes, Hyperlipidemia, and Vitamin D Deficiency.   HTN predates since     . Patient's BP has been controlled at home.Today's  . Patient denies any cardiac symptoms as chest pain, palpitations, shortness of breath, dizziness or ankle swelling.   Patient's hyperlipidemia is controlled with diet and medications. Patient denies myalgias or other medication SE's. Last lipids were 11/08/2013: Cholesterol, Total 159; HDL Cholesterol by NMR 65; LDL (calc) 78; Triglycerides 78   Patient has prediabetes since    and last A1c was 11/08/2013: Hemoglobin-A1c 6.0*   Patient denies reactive hypoglycemic symptoms, visual blurring, diabetic polys or paresthesias.    Patient has Diabetes with last A1c was2/12/2013: Hemoglobin-A1c 6.0*   Patient denies reactive hypoglycemic symptoms, visual blurring, diabetic polys, or paresthesias.   Finally, patient has history of Vitamin D Deficiency of    and last vitamin D was 08/09/2013: Vit D, 25-Hydroxy 62   Current Outpatient Prescriptions on File Prior to Visit  Medication Sig Dispense Refill  . ALPRAZolam (XANAX) 0.5 MG tablet Take 0.5 mg by mouth at bedtime as needed for sleep.      Marland Kitchen aspirin EC 81 MG tablet Take 81 mg by mouth daily.      Marland Kitchen buPROPion (WELLBUTRIN XL) 300 MG 24 hr tablet Take 300 mg by mouth daily.      . CRESTOR 20 MG tablet Take 1 tablet by mouth  every day for cholesterol  90 tablet  3  . enalapril (VASOTEC) 20 MG tablet Take 1 tablet by mouth  every day for blood  pressure  120 tablet  0  . Flaxseed, Linseed, (FLAX SEED OIL) 1000 MG CAPS Take 1 capsule by mouth 1 day or 1 dose.      . Vitamin D, Ergocalciferol, (DRISDOL) 50000 UNITS CAPS capsule Take 50,000 Units by mouth 1 day or 1 dose.       No current  facility-administered medications on file prior to visit.   Allergies  Allergen Reactions  . Asa [Aspirin]   . Celebrex [Celecoxib]   . Codeine   . Penicillins   . Savella [Milnacipran Hcl]   . Viagra [Sildenafil Citrate] Other (See Comments)    Headache   Past Medical History  Diagnosis Date  . Hypertension   . Hyperlipidemia   . GERD (gastroesophageal reflux disease)   . Elevated hemoglobin A1c   . IBS (irritable bowel syndrome)   . OSA (obstructive sleep apnea)   . Fibromyalgia   . BPH (benign prostatic hyperplasia)   . Hypogonadism male    Past Surgical History  Procedure Laterality Date  . Spine surgery  2007    L5 S 1 Disk  . Knee arthroscopy Left 1999  .  nasal smr np3  1985  . Vasectomy  1983   Family History  Problem Relation Age of Onset  . Cancer Mother     breast  . Cancer Father     lung  . Diabetes Father   . Heart disease Sister   . Arthritis Sister    History   Social History  . Marital Status: Single    Spouse Name: N/A    Number of Children: N/A  . Years of Education: N/A   Occupational History  . Not on file.  Social History Main Topics  . Smoking status: Former Smoker -- 30 years    Types: Cigarettes    Quit date: 10/06/1980  . Smokeless tobacco: Not on file  . Alcohol Use: Yes     Comment: Beer  . Drug Use: No  . Sexual Activity: Not on file   Other Topics Concern  . Not on file   Social History Narrative  . No narrative on file    ROS Constitutional: Denies fever, chills, weight loss/gain, headaches, insomnia, fatigue, night sweats or change in appetite. Eyes: Denies redness, blurred vision, diplopia, discharge, itchy or watery eyes.  ENT: Denies discharge, congestion, post nasal drip, epistaxis, sore throat, earache, hearing loss, dental pain, Tinnitus, Vertigo, Sinus pain or snoring.  Cardio: Denies chest pain, palpitations, irregular heartbeat, syncope, dyspnea, diaphoresis, orthopnea, PND, claudication or  edema Respiratory: denies cough, dyspnea, DOE, pleurisy, hoarseness, laryngitis or wheezing.  Gastrointestinal: Denies dysphagia, heartburn, reflux, water brash, pain, cramps, nausea, vomiting, bloating, diarrhea, constipation, hematemesis, melena, hematochezia, jaundice or hemorrhoids Genitourinary: Denies dysuria, frequency, urgency, nocturia, hesitancy, discharge, hematuria or flank pain Musculoskeletal: Denies arthralgia, myalgia, stiffness, Jt. Swelling, pain, limp or strain/sprain. Denies Falls. Skin: Denies puritis, rash, hives, warts, acne, eczema or change in skin lesion Neuro: No weakness, tremor, incoordination, spasms, paresthesia or pain Psychiatric: Denies confusion, memory loss or sensory loss. Denies Depression. Endocrine: Denies change in weight, skin, hair change, nocturia, and paresthesia, diabetic polys, visual blurring or hyper / hypo glycemic episodes.  Heme/Lymph: No excessive bleeding, bruising or enlarged lymph nodes.  Physical Exam  There were no vitals taken for this visit.  General Appearance: Well nourished, in no apparent distress. Eyes: PERRLA, EOMs, conjunctiva no swelling or erythema, normal fundi and vessels. Sinuses: No frontal/maxillary tenderness ENT/Mouth: EACs patent / TMs  nl. Nares clear without erythema, swelling, mucoid exudates. Oral hygiene is good. No erythema, swelling, or exudate. Tongue normal, non-obstructing. Tonsils not swollen or erythematous. Hearing normal.  Neck: Supple, thyroid normal. No bruits, nodes or JVD. Respiratory: Respiratory effort normal.  BS equal and clear bilateral without rales, rhonci, wheezing or stridor. Cardio: Heart sounds are normal with regular rate and rhythm and no murmurs, rubs or gallops. Peripheral pulses are normal and equal bilaterally without edema. No aortic or femoral bruits. Chest: symmetric with normal excursions and percussion.  Abdomen: Flat, soft, with bowl sounds. Nontender, no guarding, rebound,  hernias, masses, or organomegaly.  Lymphatics: Non tender without lymphadenopathy.  Genitourinary: No hernias.Testes nl. DRE - prostate nl for age - smooth & firm w/o nodules. Musculoskeletal: Full ROM all peripheral extremities, joint stability, 5/5 strength, and normal gait. Skin: Warm and dry without rashes, lesions, cyanosis, clubbing or  ecchymosis.  Neuro: Cranial nerves intact, reflexes equal bilaterally. Normal muscle tone, no cerebellar symptoms. Sensation intact.  Pysch: Awake and oriented X 3with normal affect, insight and judgment appropriate.  Assessment and Plan  1. Annual Screening Examination 2. Hypertension  3. Hyperlipidemia 4. Pre Diabetes 5. Vitamin D Deficiency  Continue prudent diet as discussed, weight control, BP monitoring, regular exercise, and medications as discussed.  Discussed med effects and SE's. Routine screening labs and tests as requested with regular follow-up as recommended.

## 2014-05-05 ENCOUNTER — Encounter: Payer: Self-pay | Admitting: Internal Medicine

## 2014-05-05 ENCOUNTER — Ambulatory Visit (INDEPENDENT_AMBULATORY_CARE_PROVIDER_SITE_OTHER): Payer: Medicare Other | Admitting: Internal Medicine

## 2014-05-05 VITALS — BP 122/74 | HR 60 | Temp 98.1°F | Resp 16 | Ht 73.0 in | Wt 216.8 lb

## 2014-05-05 DIAGNOSIS — R7402 Elevation of levels of lactic acid dehydrogenase (LDH): Secondary | ICD-10-CM | POA: Diagnosis not present

## 2014-05-05 DIAGNOSIS — Z1212 Encounter for screening for malignant neoplasm of rectum: Secondary | ICD-10-CM

## 2014-05-05 DIAGNOSIS — E785 Hyperlipidemia, unspecified: Secondary | ICD-10-CM | POA: Diagnosis not present

## 2014-05-05 DIAGNOSIS — Z125 Encounter for screening for malignant neoplasm of prostate: Secondary | ICD-10-CM | POA: Diagnosis not present

## 2014-05-05 DIAGNOSIS — Z79899 Other long term (current) drug therapy: Secondary | ICD-10-CM | POA: Diagnosis not present

## 2014-05-05 DIAGNOSIS — Z113 Encounter for screening for infections with a predominantly sexual mode of transmission: Secondary | ICD-10-CM

## 2014-05-05 DIAGNOSIS — I158 Other secondary hypertension: Secondary | ICD-10-CM | POA: Diagnosis not present

## 2014-05-05 DIAGNOSIS — I10 Essential (primary) hypertension: Secondary | ICD-10-CM | POA: Diagnosis not present

## 2014-05-05 DIAGNOSIS — I159 Secondary hypertension, unspecified: Secondary | ICD-10-CM

## 2014-05-05 DIAGNOSIS — R7309 Other abnormal glucose: Secondary | ICD-10-CM

## 2014-05-05 DIAGNOSIS — Z0000 Encounter for general adult medical examination without abnormal findings: Secondary | ICD-10-CM

## 2014-05-05 DIAGNOSIS — Z789 Other specified health status: Secondary | ICD-10-CM

## 2014-05-05 DIAGNOSIS — E559 Vitamin D deficiency, unspecified: Secondary | ICD-10-CM | POA: Diagnosis not present

## 2014-05-05 DIAGNOSIS — Z1331 Encounter for screening for depression: Secondary | ICD-10-CM

## 2014-05-05 DIAGNOSIS — R740 Nonspecific elevation of levels of transaminase and lactic acid dehydrogenase [LDH]: Secondary | ICD-10-CM

## 2014-05-05 LAB — CBC WITH DIFFERENTIAL/PLATELET
Basophils Absolute: 0 10*3/uL (ref 0.0–0.1)
Basophils Relative: 0 % (ref 0–1)
EOS PCT: 2 % (ref 0–5)
Eosinophils Absolute: 0.1 10*3/uL (ref 0.0–0.7)
HCT: 43.8 % (ref 39.0–52.0)
Hemoglobin: 14.9 g/dL (ref 13.0–17.0)
LYMPHS ABS: 1.9 10*3/uL (ref 0.7–4.0)
LYMPHS PCT: 26 % (ref 12–46)
MCH: 30.2 pg (ref 26.0–34.0)
MCHC: 34 g/dL (ref 30.0–36.0)
MCV: 88.7 fL (ref 78.0–100.0)
Monocytes Absolute: 0.6 10*3/uL (ref 0.1–1.0)
Monocytes Relative: 8 % (ref 3–12)
NEUTROS ABS: 4.6 10*3/uL (ref 1.7–7.7)
Neutrophils Relative %: 64 % (ref 43–77)
PLATELETS: 189 10*3/uL (ref 150–400)
RBC: 4.94 MIL/uL (ref 4.22–5.81)
RDW: 13.5 % (ref 11.5–15.5)
WBC: 7.2 10*3/uL (ref 4.0–10.5)

## 2014-05-05 MED ORDER — ALPRAZOLAM 0.5 MG PO TABS: 0.5000 mg | ORAL_TABLET | Freq: Three times a day (TID) | ORAL | Status: DC | PRN

## 2014-05-05 NOTE — Patient Instructions (Signed)

## 2014-05-05 NOTE — Progress Notes (Signed)
Patient ID: Cody Hines, male   DOB: 1946/04/06, 68 y.o.   MRN: [id number]   Annual Screening Comprehensive Examination  This very nice 68 y.o.MWM presents for complete physical.  Patient has been followed for HTN, Prediabetes, Hyperlipidemia, and Vitamin D Deficiency.   HTN predates since 68. Patient's BP has been controlled at home.Today's BP: 122/74 mmHg. Patient denies any cardiac symptoms as chest pain, palpitations, shortness of breath, dizziness or ankle swelling.   Patient's hyperlipidemia is controlled with diet and medications. Patient denies myalgias or other medication SE's. Last lipids were 11/08/2013: Cholesterol, Total 159; HDL Cholesterol by NMR 65; LDL (calc) 78; Triglycerides 78   Patient has prediabetes since Apr 2011 and patient denies reactive hypoglycemic symptoms, visual blurring, diabetic polys or paresthesias. Last A1c was 11/08/2013: Hemoglobin-A1c 6.0*    Finally, patient has history of Vitamin D Deficiency of  29 in 2008 and last vitamin D was 08/09/2013: Vit D, 25-Hydroxy 62 .  Medication Sig  . aspirin EC 81 MG tablet Take 81 mg by mouth daily.  Marland Kitchen buPROPion  XL 300 MG 24 hr tablet Take 300 mg by mouth daily.  . CRESTOR 20 MG tablet Take 1 tablet by mouth  every day for cholesterol  . enalapril  20 MG tablet Take 1 tablet by mouth  every day for blood  pressure  . FLAX SEED OIL 1000 MG CAPS Take 1 capsule by mouth 2 (two) times daily.   . Vitamin D 50,00 UNITS CAPS capsule Take 50,000 Units by mouth daily.    Allergies  Allergen Reactions  . Asa [Aspirin]   . Celebrex [Celecoxib]   . Codeine   . Penicillins   . Savella [Milnacipran Hcl]   . Viagra [Sildenafil Citrate] Other (See Comments)    Headache   Past Medical History  Diagnosis Date  . Hypertension   . Hyperlipidemia   . GERD (gastroesophageal reflux disease)   . Elevated hemoglobin A1c   . IBS (irritable bowel syndrome)   . OSA (obstructive sleep apnea)   . Fibromyalgia   . BPH (benign  prostatic hyperplasia)   . Hypogonadism male    Past Surgical History  Procedure Laterality Date  . Spine surgery  2007    L5 S 1 Disk  . Knee arthroscopy Left 1999  .  nasal smr np3  1985  . Vasectomy  1983   Family History  Problem Relation Age of Onset  . Cancer Mother     breast  . Cancer Father     lung  . Diabetes Father   . Heart disease Sister   . Arthritis Sister    History   Social History  . Marital Status: Single    Spouse Name: N/A    Number of Children: N/A  . Years of Education: N/A   Occupational History  . Not on file.   Social History Main Topics  . Smoking status: Former Smoker -- 30 years    Types: Cigarettes    Quit date: 10/06/1980  . Smokeless tobacco: Not on file  . Alcohol Use: Yes     Comment: Beer- 12 cans per week  . Drug Use: No  . Sexual Activity: Not on file    ROS Constitutional: Denies fever, chills, weight loss/gain, headaches, insomnia, fatigue, night sweats or change in appetite. Eyes: Denies redness, blurred vision, diplopia, discharge, itchy or watery eyes.  ENT: Denies discharge, congestion, post nasal drip, epistaxis, sore throat, earache, hearing loss, dental pain, Tinnitus, Vertigo, Sinus  pain or snoring.  Cardio: Denies chest pain, palpitations, irregular heartbeat, syncope, dyspnea, diaphoresis, orthopnea, PND, claudication or edema Respiratory: denies cough, dyspnea, DOE, pleurisy, hoarseness, laryngitis or wheezing.  Gastrointestinal: Denies dysphagia, heartburn, reflux, water brash, pain, cramps, nausea, vomiting, bloating, diarrhea, constipation, hematemesis, melena, hematochezia, jaundice or hemorrhoids Genitourinary: Denies dysuria, frequency, urgency, nocturia, hesitancy, discharge, hematuria or flank pain Musculoskeletal: Denies arthralgia, myalgia, stiffness, Jt. Swelling, pain, limp or strain/sprain. Denies Falls. Skin: Denies puritis, rash, hives, warts, acne, eczema or change in skin lesion Neuro: No weakness,  tremor, incoordination, spasms, paresthesia or pain Psychiatric: Denies confusion, memory loss or sensory loss. Denies Depression. Endocrine: Denies change in weight, skin, hair change, nocturia, and paresthesia, diabetic polys, visual blurring or hyper / hypo glycemic episodes.  Heme/Lymph: No excessive bleeding, bruising or enlarged lymph nodes.  Physical Exam  BP 122/74  Pulse 60  Temp(Src) 98.1 F (36.7 C) (Temporal)  Resp 16  Ht 6\' 1"  (1.854 m)  Wt 216 lb 12.8 oz (98.34 kg)  BMI 28.61 kg/m2  General Appearance: Well nourished, in no apparent distress. Eyes: PERRLA, EOMs, conjunctiva no swelling or erythema, normal fundi and vessels. Sinuses: No frontal/maxillary tenderness ENT/Mouth: EACs patent / TMs  nl. Nares clear without erythema, swelling, mucoid exudates. Oral hygiene is good. No erythema, swelling, or exudate. Tongue normal, non-obstructing. Tonsils not swollen or erythematous. Hearing normal.  Neck: Supple, thyroid normal. No bruits, nodes or JVD. Respiratory: Respiratory effort normal.  BS equal and clear bilateral without rales, rhonci, wheezing or stridor. Cardio: Heart sounds are normal with regular rate and rhythm and no murmurs, rubs or gallops. Peripheral pulses are normal and equal bilaterally without edema. No aortic or femoral bruits. Chest: symmetric with normal excursions and percussion.  Abdomen: Flat, soft, with bowl sounds. Nontender, no guarding, rebound, hernias, masses, or organomegaly.  Lymphatics: Non tender without lymphadenopathy.  Genitourinary: No hernias.Testes nl. DRE - prostate nl for age - smooth & firm w/o nodules. Musculoskeletal: Full ROM all peripheral extremities, joint stability, 5/5 strength, and normal gait. Skin: Warm and dry without rashes, lesions, cyanosis, clubbing or  ecchymosis.  Neuro: Cranial nerves intact, reflexes equal bilaterally. Normal muscle tone, no cerebellar symptoms. Sensation intact.  Pysch: Awake and oriented X  3with normal affect, insight and judgment appropriate.  Assessment and Plan  1. Annual Screening Examination 2. Hypertension  3. Hyperlipidemia 4. Pre Diabetes 5. Vitamin D Deficiency  Continue prudent diet as discussed, weight control, BP monitoring, regular exercise, and medications as discussed.  Discussed med effects and SE's. Routine screening labs and tests as requested with regular follow-up as recommended.

## 2014-05-06 LAB — HEMOGLOBIN A1C
Hgb A1c MFr Bld: 5.9 % — ABNORMAL HIGH (ref <5.7)
Mean Plasma Glucose: 123 mg/dL — ABNORMAL HIGH (ref <117)

## 2014-05-06 LAB — BASIC METABOLIC PANEL WITH GFR
BUN: 11 mg/dL (ref 6–23)
CO2: 24 meq/L (ref 19–32)
Calcium: 9.8 mg/dL (ref 8.4–10.5)
Chloride: 99 mEq/L (ref 96–112)
Creat: 1.5 mg/dL — ABNORMAL HIGH (ref 0.50–1.35)
GFR, Est African American: 56 mL/min — ABNORMAL LOW
GFR, Est Non African American: 48 mL/min — ABNORMAL LOW
Glucose, Bld: 103 mg/dL — ABNORMAL HIGH (ref 70–99)
POTASSIUM: 4.5 meq/L (ref 3.5–5.3)
Sodium: 137 mEq/L (ref 135–145)

## 2014-05-06 LAB — HEPATIC FUNCTION PANEL
ALT: 19 U/L (ref 0–53)
AST: 20 U/L (ref 0–37)
Albumin: 4.3 g/dL (ref 3.5–5.2)
Alkaline Phosphatase: 49 U/L (ref 39–117)
BILIRUBIN DIRECT: 0.2 mg/dL (ref 0.0–0.3)
BILIRUBIN INDIRECT: 0.5 mg/dL (ref 0.2–1.2)
Total Bilirubin: 0.7 mg/dL (ref 0.2–1.2)
Total Protein: 6.9 g/dL (ref 6.0–8.3)

## 2014-05-06 LAB — VITAMIN D 25 HYDROXY (VIT D DEFICIENCY, FRACTURES): VIT D 25 HYDROXY: 101 ng/mL — AB (ref 30–89)

## 2014-05-06 LAB — HEPATITIS A ANTIBODY, TOTAL: Hep A Total Ab: NONREACTIVE

## 2014-05-06 LAB — URINALYSIS, ROUTINE W REFLEX MICROSCOPIC
BILIRUBIN URINE: NEGATIVE
Glucose, UA: NEGATIVE mg/dL
Hgb urine dipstick: NEGATIVE
Ketones, ur: NEGATIVE mg/dL
LEUKOCYTES UA: NEGATIVE
NITRITE: NEGATIVE
Protein, ur: NEGATIVE mg/dL
UROBILINOGEN UA: 0.2 mg/dL (ref 0.0–1.0)
pH: 6 (ref 5.0–8.0)

## 2014-05-06 LAB — MAGNESIUM: Magnesium: 2.1 mg/dL (ref 1.5–2.5)

## 2014-05-06 LAB — INSULIN, FASTING: Insulin fasting, serum: 19 u[IU]/mL (ref 3–28)

## 2014-05-06 LAB — TSH: TSH: 2.063 u[IU]/mL (ref 0.350–4.500)

## 2014-05-06 LAB — MICROALBUMIN / CREATININE URINE RATIO
CREATININE, URINE: 60.6 mg/dL
MICROALB UR: 0.5 mg/dL (ref 0.00–1.89)
Microalb Creat Ratio: 8.3 mg/g (ref 0.0–30.0)

## 2014-05-06 LAB — LIPID PANEL
CHOL/HDL RATIO: 2.6 ratio
Cholesterol: 126 mg/dL (ref 0–200)
HDL: 49 mg/dL (ref >39)
LDL CALC: 57 mg/dL (ref 0–99)
TRIGLYCERIDES: 100 mg/dL (ref <150)
VLDL: 20 mg/dL (ref 0–40)

## 2014-05-06 LAB — PSA: PSA: 0.66 ng/mL (ref <=4.00)

## 2014-05-06 LAB — TESTOSTERONE: Testosterone: 696 ng/dL (ref 300–890)

## 2014-05-06 LAB — RPR

## 2014-05-06 LAB — HEPATITIS B SURFACE ANTIBODY,QUALITATIVE: Hep B S Ab: POSITIVE — AB

## 2014-05-06 LAB — HEPATITIS C ANTIBODY: HCV AB: NEGATIVE

## 2014-05-06 LAB — HIV ANTIBODY (ROUTINE TESTING W REFLEX): HIV 1&2 Ab, 4th Generation: NONREACTIVE

## 2014-05-06 LAB — HEPATITIS B CORE ANTIBODY, TOTAL: Hep B Core Total Ab: NONREACTIVE

## 2014-05-08 LAB — HEPATITIS B E ANTIBODY: Hepatitis Be Antibody: NONREACTIVE

## 2014-05-17 ENCOUNTER — Other Ambulatory Visit: Payer: Self-pay | Admitting: *Deleted

## 2014-05-17 DIAGNOSIS — Z1212 Encounter for screening for malignant neoplasm of rectum: Secondary | ICD-10-CM

## 2014-05-17 LAB — POC HEMOCCULT BLD/STL (HOME/3-CARD/SCREEN)
Card #3 Fecal Occult Blood, POC: NEGATIVE
FECAL OCCULT BLD: NEGATIVE
Fecal Occult Blood, POC: NEGATIVE

## 2014-08-08 DIAGNOSIS — C44219 Basal cell carcinoma of skin of left ear and external auricular canal: Secondary | ICD-10-CM | POA: Diagnosis not present

## 2014-08-08 DIAGNOSIS — L570 Actinic keratosis: Secondary | ICD-10-CM | POA: Diagnosis not present

## 2014-08-08 DIAGNOSIS — Z08 Encounter for follow-up examination after completed treatment for malignant neoplasm: Secondary | ICD-10-CM | POA: Diagnosis not present

## 2014-08-08 DIAGNOSIS — Z85828 Personal history of other malignant neoplasm of skin: Secondary | ICD-10-CM | POA: Diagnosis not present

## 2014-08-15 ENCOUNTER — Ambulatory Visit (INDEPENDENT_AMBULATORY_CARE_PROVIDER_SITE_OTHER): Payer: Medicare Other | Admitting: Physician Assistant

## 2014-08-15 ENCOUNTER — Encounter: Payer: Self-pay | Admitting: Physician Assistant

## 2014-08-15 VITALS — BP 128/68 | HR 76 | Temp 98.1°F | Resp 16 | Ht 73.0 in | Wt 224.0 lb

## 2014-08-15 DIAGNOSIS — G4733 Obstructive sleep apnea (adult) (pediatric): Secondary | ICD-10-CM

## 2014-08-15 DIAGNOSIS — I159 Secondary hypertension, unspecified: Secondary | ICD-10-CM

## 2014-08-15 DIAGNOSIS — E785 Hyperlipidemia, unspecified: Secondary | ICD-10-CM | POA: Diagnosis not present

## 2014-08-15 DIAGNOSIS — M797 Fibromyalgia: Secondary | ICD-10-CM

## 2014-08-15 DIAGNOSIS — K2100 Gastro-esophageal reflux disease with esophagitis, without bleeding: Secondary | ICD-10-CM

## 2014-08-15 DIAGNOSIS — R7309 Other abnormal glucose: Secondary | ICD-10-CM

## 2014-08-15 DIAGNOSIS — Z0001 Encounter for general adult medical examination with abnormal findings: Secondary | ICD-10-CM | POA: Diagnosis not present

## 2014-08-15 DIAGNOSIS — Z789 Other specified health status: Secondary | ICD-10-CM

## 2014-08-15 DIAGNOSIS — Z1331 Encounter for screening for depression: Secondary | ICD-10-CM

## 2014-08-15 DIAGNOSIS — E291 Testicular hypofunction: Secondary | ICD-10-CM

## 2014-08-15 DIAGNOSIS — Z79899 Other long term (current) drug therapy: Secondary | ICD-10-CM

## 2014-08-15 DIAGNOSIS — E559 Vitamin D deficiency, unspecified: Secondary | ICD-10-CM

## 2014-08-15 DIAGNOSIS — K589 Irritable bowel syndrome without diarrhea: Secondary | ICD-10-CM

## 2014-08-15 DIAGNOSIS — N400 Enlarged prostate without lower urinary tract symptoms: Secondary | ICD-10-CM

## 2014-08-15 DIAGNOSIS — R6889 Other general symptoms and signs: Secondary | ICD-10-CM | POA: Diagnosis not present

## 2014-08-15 LAB — BASIC METABOLIC PANEL WITH GFR
BUN: 13 mg/dL (ref 6–23)
CALCIUM: 9.8 mg/dL (ref 8.4–10.5)
CO2: 27 mEq/L (ref 19–32)
Chloride: 102 mEq/L (ref 96–112)
Creat: 1.43 mg/dL — ABNORMAL HIGH (ref 0.50–1.35)
GFR, Est African American: 59 mL/min — ABNORMAL LOW
GFR, Est Non African American: 51 mL/min — ABNORMAL LOW
Glucose, Bld: 114 mg/dL — ABNORMAL HIGH (ref 70–99)
Potassium: 4.5 mEq/L (ref 3.5–5.3)
Sodium: 138 mEq/L (ref 135–145)

## 2014-08-15 LAB — HEPATIC FUNCTION PANEL
ALT: 34 U/L (ref 0–53)
AST: 31 U/L (ref 0–37)
Albumin: 4.9 g/dL (ref 3.5–5.2)
Alkaline Phosphatase: 46 U/L (ref 39–117)
BILIRUBIN TOTAL: 0.6 mg/dL (ref 0.2–1.2)
Bilirubin, Direct: 0.1 mg/dL (ref 0.0–0.3)
Indirect Bilirubin: 0.5 mg/dL (ref 0.2–1.2)
TOTAL PROTEIN: 7.2 g/dL (ref 6.0–8.3)

## 2014-08-15 LAB — CBC WITH DIFFERENTIAL/PLATELET
Basophils Absolute: 0 10*3/uL (ref 0.0–0.1)
Basophils Relative: 0 % (ref 0–1)
EOS ABS: 0.1 10*3/uL (ref 0.0–0.7)
EOS PCT: 2 % (ref 0–5)
HEMATOCRIT: 45.1 % (ref 39.0–52.0)
Hemoglobin: 15.7 g/dL (ref 13.0–17.0)
LYMPHS ABS: 2.1 10*3/uL (ref 0.7–4.0)
Lymphocytes Relative: 30 % (ref 12–46)
MCH: 30.1 pg (ref 26.0–34.0)
MCHC: 34.8 g/dL (ref 30.0–36.0)
MCV: 86.6 fL (ref 78.0–100.0)
Monocytes Absolute: 0.6 10*3/uL (ref 0.1–1.0)
Monocytes Relative: 9 % (ref 3–12)
Neutro Abs: 4.1 10*3/uL (ref 1.7–7.7)
Neutrophils Relative %: 59 % (ref 43–77)
PLATELETS: 219 10*3/uL (ref 150–400)
RBC: 5.21 MIL/uL (ref 4.22–5.81)
RDW: 14.2 % (ref 11.5–15.5)
WBC: 6.9 10*3/uL (ref 4.0–10.5)

## 2014-08-15 LAB — LIPID PANEL
Cholesterol: 154 mg/dL (ref 0–200)
HDL: 56 mg/dL (ref >39)
LDL Cholesterol: 61 mg/dL (ref 0–99)
Total CHOL/HDL Ratio: 2.8 Ratio
Triglycerides: 187 mg/dL — ABNORMAL HIGH (ref <150)
VLDL: 37 mg/dL (ref 0–40)

## 2014-08-15 LAB — TSH: TSH: 2.696 u[IU]/mL (ref 0.350–4.500)

## 2014-08-15 LAB — MAGNESIUM: Magnesium: 2.1 mg/dL (ref 1.5–2.5)

## 2014-08-15 NOTE — Progress Notes (Signed)
MEDICARE ANNUAL WELLNESS VISIT AND FOLLOW UP Assessment:   1.  hypertension, unspecified - CBC with Differential - BASIC METABOLIC PANEL WITH GFR - Hepatic function panel  2. OSA (obstructive sleep apnea) Continue CPAP, weight loss advised  3. Gastroesophageal reflux disease with esophagitis Continue PPI/H2 blocker, diet discussed  4. IBS (irritable bowel syndrome) Add benefiber  5. Hypogonadism male  6. Fibromyalgia Continue medications, exercise, increase veggies  7. BPH (benign prostatic hyperplasia) controlled   8. Hyperlipidemia - TSH - Lipid panel  9. Elevated hemoglobin A1c Discussed general issues about diabetes pathophysiology and management., Educational material distributed., Suggested low cholesterol diet., Encouraged aerobic exercise., Discussed foot care., Reminded to get yearly retinal exam. - Hemoglobin A1c - HM DIABETES FOOT EXAM  10. Medication management - Magnesium  11. Vitamin D deficiency   Plan:   During the course of the visit the patient was educated and counseled about appropriate screening and preventive services including:    Pneumococcal vaccine   Influenza vaccine  Td vaccine  Screening electrocardiogram  Colorectal cancer screening  Diabetes screening  Glaucoma screening  Nutrition counseling   Screening recommendations, referrals: Vaccinations: Please see documentation below and orders this visit.  Nutrition assessed and recommended  Colonoscopy up to date Recommended yearly ophthalmology/optometry visit for glaucoma screening and checkup Recommended yearly dental visit for hygiene and checkup Advanced directives - requested  Conditions/risks identified: BMI: Discussed weight loss, diet, and increase physical activity.  Increase physical activity: AHA recommends 150 minutes of physical activity a week.  Medications reviewed Urinary Incontinence is not an issue: discussed non pharmacology and pharmacology  options.  Fall risk: low- discussed PT, home fall assessment, medications.    Subjective:  Cody Hines is a 68 y.o. male who presents for welcome to medicare visit and 3 month follow up for HTN, hyperlipidemia, prediabetes, and vitamin D Def.   His blood pressure has been controlled at home, today their BP is BP: 128/68 mmHg He does workout. He denies chest pain, shortness of breath, dizziness.  He is on cholesterol medication, he is on crestor 1/2 tablet daily and denies myalgias. His cholesterol is at goal. The cholesterol last visit was:   Lab Results  Component Value Date   CHOL 126 05/05/2014   HDL 49 05/05/2014   LDLCALC 57 05/05/2014   TRIG 100 05/05/2014   CHOLHDL 2.6 05/05/2014   He has been working on diet and exercise for prediabetes, he is on ACE, and denies paresthesia of the feet, polydipsia, polyuria and visual disturbances. Last A1C in the office was:  Lab Results  Component Value Date   HGBA1C 5.9* 05/05/2014   Patient is on Vitamin D supplement.   Lab Results  Component Value Date   VD25OH 101* 05/05/2014    He states the wellbutrin helps with his depression.  He takes xanax very rarely for sleep.   Names of Other Physician/Practitioners you currently use: 1. [facility] here for primary care 2. Dr. Katy Fitch, eye doctor, last visit couple years 26.  dentist, none Patient Care Team: [name], MD as PCP - General (Internal Medicine) Simona Huh, MD as Consulting Physician (Dermatology) Missy Sabins, MD as Consulting Physician (Gastroenterology)  Medication Review: Current Outpatient Prescriptions on File Prior to Visit  Medication Sig Dispense Refill  . ALPRAZolam (XANAX) 0.5 MG tablet Take 1 tablet (0.5 mg total) by mouth 3 (three) times daily as needed for anxiety or sleep. 90 tablet 1  . aspirin  EC 81 MG tablet Take 81 mg by mouth daily.    Marland Kitchen buPROPion (WELLBUTRIN XL) 300 MG 24 hr tablet Take  300 mg by mouth daily.    . CRESTOR 20 MG tablet Take 1 tablet by mouth  every day for cholesterol 90 tablet 3  . enalapril (VASOTEC) 20 MG tablet Take 1 tablet by mouth  every day for blood  pressure 120 tablet 0  . Flaxseed, Linseed, (FLAX SEED OIL) 1000 MG CAPS Take 1 capsule by mouth 2 (two) times daily.     . Vitamin D, Ergocalciferol, (DRISDOL) 50000 UNITS CAPS capsule Take 50,000 Units by mouth daily.      No current facility-administered medications on file prior to visit.    Current Problems (verified) Patient Active Problem List   Diagnosis Date Noted  . Medication management 05/03/2014  . Vitamin D deficiency 05/03/2014  . Hypertension   . Hyperlipidemia   . GERD (gastroesophageal reflux disease)   . Elevated hemoglobin A1c   . IBS (irritable bowel syndrome)   . OSA (obstructive sleep apnea)   . Fibromyalgia   . BPH (benign prostatic hyperplasia)   . Hypogonadism male     Screening Tests Health Maintenance  Topic Date Due  . COLONOSCOPY  08/19/1998  . PNEUMOCOCCAL POLYSACCHARIDE VACCINE AGE 46 AND OVER  08/19/2013  . INFLUENZA VACCINE  05/06/2014  . TETANUS/TDAP  01/14/2020  . ZOSTAVAX  Completed    Immunization History  Administered Date(s) Administered  . Pneumococcal Conjugate-13 07/16/2011  . Tdap 01/13/2010  . Zoster 08/09/2013   Preventative care: Last colonoscopy: 2015   Prior vaccinations: TD or Tdap: 2011  Influenza: declines Pneumococcal: 2012 Prevnar 13: declines Shingles/Zostavax: 2014  History reviewed: allergies, current medications, past family history, past medical history, past social history, past surgical history and problem list   Risk Factors: Tobacco History  Substance Use Topics  . Smoking status: Former Smoker -- 30 years    Types: Cigarettes    Quit date: 10/06/1980  . Smokeless tobacco: Not on file  . Alcohol Use: Yes     Comment: Beer- 12 cans per week   He does not smoke.  Patient is not a former smoker. Are  there smokers in your home (other than you)?  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  Depression Screen (Note: if answer to either of the following is "Yes", a more complete depression screening is indicated)   Q1: Over the past two weeks, have you felt down, depressed or hopeless? No  Q2: Over the past two weeks, have you felt little interest or pleasure in doing things? No  Have you lost interest or pleasure in daily life? No  Do you often feel hopeless? No  Do you cry easily over simple problems? No  Activities of Daily Living In your present state of health, do you have any difficulty performing the following activities?:  Driving? No Managing money?  No Feeding yourself? No Getting from bed to chair? No Climbing a flight of stairs? No Preparing food and eating?: No Bathing or showering? No Getting dressed: No Getting to the toilet? No Using the toilet:No Moving  around from place to place: No In the past year have you fallen or had a near fall?:No   Are you sexually active?  No  Do you have more than one partner?  No  Vision Difficulties: No  Hearing Difficulties: No Do you often ask people to speak up or repeat themselves? No Do you experience ringing or noises in your ears? No Do you have difficulty understanding soft or whispered voices? No  Cognition  Do you feel that you have a problem with memory?No  Do you often misplace items? No  Do you feel safe at home?  Yes  Advanced directives Does patient have a [place]? Yes Does patient have a Living  Will? Yes   Objective:   Blood pressure 128/68, pulse 76, temperature 98.1 F (36.7 C), resp. rate 16, height 6\' 1"  (1.854 m), weight 224 lb (101.606 kg). Body mass index is 29.56 kg/(m^2).  General appearance: alert, no distress, WD/WN, male Cognitive Testing  Alert? Yes  Normal Appearance?Yes  Oriented to person? Yes  Place? Yes   Time? Yes  Recall of three objects?  Yes  Can perform simple calculations? Yes  Displays appropriate judgment?Yes  Can read the correct time from a watch face?Yes  HEENT: normocephalic, sclerae anicteric, TMs pearly, nares patent, no discharge or erythema, pharynx normal Oral cavity: MMM, no lesions Neck: supple, no lymphadenopathy, no thyromegaly, no masses Heart: RRR, normal S1, S2, no murmurs Lungs: CTA bilaterally, no wheezes, rhonchi, or rales Abdomen: +bs, soft, non tender, non distended, no masses, no hepatomegaly, no splenomegaly Musculoskeletal: nontender, no swelling, no obvious deformity Extremities: no edema, no cyanosis, no clubbing Pulses: 2+ symmetric, upper and lower extremities, normal cap refill Neurological: alert, oriented x 3, CN2-12 intact, strength normal upper extremities and lower extremities, sensation normal throughout, DTRs 2+ throughout, no cerebellar signs, gait normal Psychiatric: normal affect, behavior normal, pleasant   Medicare Attestation I have personally reviewed: The patient's medical and social history Their use of alcohol, tobacco or illicit drugs Their current medications and supplements The patient's functional ability including ADLs,fall risks, home safety risks, cognitive, and hearing and visual impairment Diet and physical activities Evidence for depression or mood disorders  The patient's weight, height, BMI, and visual acuity have been recorded in the chart.  I have made referrals, counseling, and provided education to the patient based on review of the above and I have provided the patient with a  written personalized care plan for preventive services.     Vicie Mutters, PA-C   08/15/2014

## 2014-08-15 NOTE — Patient Instructions (Signed)
Preventative Care for Adults, Male       REGULAR HEALTH EXAMS:  A routine yearly physical is a good way to check in with your primary care provider about your health and preventive screening. It is also an opportunity to share updates about your health and any concerns you have, and receive a thorough all-over exam.   Most health insurance companies pay for at least some preventative services.  Check with your health plan for specific coverages.  WHAT PREVENTATIVE SERVICES DO MEN NEED?  Adult men should have their weight and blood pressure checked regularly.   Men age 35 and older should have their cholesterol levels checked regularly.  Beginning at age 50 and continuing to age 75, men should be screened for colorectal cancer.  Certain people should may need continued testing until age 85.  Other cancer screening may include exams for testicular and prostate cancer.  Updating vaccinations is part of preventative care.  Vaccinations help protect against diseases such as the flu.  Lab tests are generally done as part of preventative care to screen for anemia and blood disorders, to screen for problems with the kidneys and liver, to screen for bladder problems, to check blood sugar, and to check your cholesterol level.  Preventative services generally include counseling about diet, exercise, avoiding tobacco, drugs, excessive alcohol consumption, and sexually transmitted infections.    GENERAL RECOMMENDATIONS FOR GOOD HEALTH:  Healthy diet:  Eat a variety of foods, including fruit, vegetables, animal or vegetable protein, such as meat, fish, chicken, and eggs, or beans, lentils, tofu, and grains, such as rice.  Drink plenty of water daily.  Decrease saturated fat in the diet, avoid lots of red meat, processed foods, sweets, fast foods, and fried foods.  Exercise:  Aerobic exercise helps maintain good heart health. At least 30-40 minutes of moderate-intensity exercise is recommended.  For example, a brisk walk that increases your heart rate and breathing. This should be done on most days of the week.   Find a type of exercise or a variety of exercises that you enjoy so that it becomes a part of your daily life.  Examples are running, walking, swimming, water aerobics, and biking.  For motivation and support, explore group exercise such as aerobic class, spin class, Zumba, Yoga,or  martial arts, etc.    Set exercise goals for yourself, such as a certain weight goal, walk or run in a race such as a 5k walk/run.  Speak to your primary care provider about exercise goals.  Disease prevention:  If you smoke or chew tobacco, find out from your caregiver how to quit. It can literally save your life, no matter how long you have been a tobacco user. If you do not use tobacco, never begin.   Maintain a healthy diet and normal weight. Increased weight leads to problems with blood pressure and diabetes.   The Body Mass Index or BMI is a way of measuring how much of your body is fat. Having a BMI above 27 increases the risk of heart disease, diabetes, hypertension, stroke and other problems related to obesity. Your caregiver can help determine your BMI and based on it develop an exercise and dietary program to help you achieve or maintain this important measurement at a healthful level.  High blood pressure causes heart and blood vessel problems.  Persistent high blood pressure should be treated with medicine if weight loss and exercise do not work.   Fat and cholesterol leaves deposits in your arteries   that can block them. This causes heart disease and vessel disease elsewhere in your body.  If your cholesterol is found to be high, or if you have heart disease or certain other medical conditions, then you may need to have your cholesterol monitored frequently and be treated with medication.   Ask if you should have a stress test if your history suggests this. A stress test is a test done on  a treadmill that looks for heart disease. This test can find disease prior to there being a problem.  Avoid drinking alcohol in excess (more than two drinks per day).  Avoid use of street drugs. Do not share needles with anyone. Ask for professional help if you need assistance or instructions on stopping the use of alcohol, cigarettes, and/or drugs.  Brush your teeth twice a day with fluoride toothpaste, and floss once a day. Good oral hygiene prevents tooth decay and gum disease. The problems can be painful, unattractive, and can cause other health problems. Visit your dentist for a routine oral and dental check up and preventive care every 6-12 months.   Look at your skin regularly.  Use a mirror to look at your back. Notify your caregivers of changes in moles, especially if there are changes in shapes, colors, a size larger than a pencil eraser, an irregular border, or development of new moles.  Safety:  Use seatbelts 100% of the time, whether driving or as a passenger.  Use safety devices such as hearing protection if you work in environments with loud noise or significant background noise.  Use safety glasses when doing any work that could send debris in to the eyes.  Use a helmet if you ride a bike or motorcycle.  Use appropriate safety gear for contact sports.  Talk to your caregiver about gun safety.  Use sunscreen with a SPF (or skin protection factor) of 15 or greater.  Lighter skinned people are at a greater risk of skin cancer. Don't forget to also wear sunglasses in order to protect your eyes from too much damaging sunlight. Damaging sunlight can accelerate cataract formation.   Practice safe sex. Use condoms. Condoms are used for birth control and to help reduce the spread of sexually transmitted infections (or STIs).  Some of the STIs are gonorrhea (the clap), chlamydia, syphilis, trichomonas, herpes, HPV (human papilloma virus) and HIV (human immunodeficiency virus) which causes AIDS.  The herpes, HIV and HPV are viral illnesses that have no cure. These can result in disability, cancer and death.   Keep carbon monoxide and smoke detectors in your home functioning at all times. Change the batteries every 6 months or use a model that plugs into the wall.   Vaccinations:  Stay up to date with your tetanus shots and other required immunizations. You should have a booster for tetanus every 10 years. Be sure to get your flu shot every year, since 5%-20% of the [place] population comes down with the flu. The flu vaccine changes each year, so being vaccinated once is not enough. Get your shot in the fall, before the flu season peaks.   Other vaccines to consider:  Pneumococcal vaccine to protect against certain types of pneumonia.  This is normally recommended for adults age 65 or older.  However, adults younger than 65 years old with certain underlying conditions such as diabetes, heart or lung disease should also receive the vaccine.  Shingles vaccine to protect against Varicella Zoster if you are older than age 60, or younger   than 66 years old with certain underlying illness.  Hepatitis A vaccine to protect against a form of infection of the liver by a virus acquired from food.  Hepatitis B vaccine to protect against a form of infection of the liver by a virus acquired from blood or body fluids, particularly if you work in health care.  If you plan to travel internationally, check with your local health department for specific vaccination recommendations.  Cancer Screening:  Most routine colon cancer screening begins at the age of 50. On a yearly basis, doctors may provide special easy to use take-home tests to check for hidden blood in the stool. Sigmoidoscopy or colonoscopy can detect the earliest forms of colon cancer and is life saving. These tests use a small camera at the end of a tube to directly examine the colon. Speak to your caregiver about this at age 50, when routine  screening begins (and is repeated every 5 years unless early forms of pre-cancerous polyps or small growths are found).   At the age of 50 men usually start screening for prostate cancer every year. Screening may begin at a younger age for those with higher risk. Those at higher risk include African-Americans or having a family history of prostate cancer. There are two types of tests for prostate cancer:   Prostate-specific antigen (PSA) testing. Recent studies raise questions about prostate cancer using PSA and you should discuss this with your caregiver.   Digital rectal exam (in which your doctor's lubricated and gloved finger feels for enlargement of the prostate through the anus).   Screening for testicular cancer.  Do a monthly exam of your testicles. Gently roll each testicle between your thumb and fingers, feeling for any abnormal lumps. The best time to do this is after a hot shower or bath when the tissues are looser. Notify your caregivers of any lumps, tenderness or changes in size or shape immediately.     

## 2014-08-16 LAB — HEMOGLOBIN A1C
HEMOGLOBIN A1C: 5.9 % — AB (ref <5.7)
MEAN PLASMA GLUCOSE: 123 mg/dL — AB (ref <117)

## 2014-10-24 ENCOUNTER — Other Ambulatory Visit: Payer: Self-pay | Admitting: *Deleted

## 2014-10-24 MED ORDER — BUPROPION HCL ER (XL) 300 MG PO TB24: 300.0000 mg | ORAL_TABLET | Freq: Every day | ORAL | Status: DC

## 2014-11-07 DIAGNOSIS — Z08 Encounter for follow-up examination after completed treatment for malignant neoplasm: Secondary | ICD-10-CM | POA: Diagnosis not present

## 2014-11-07 DIAGNOSIS — L570 Actinic keratosis: Secondary | ICD-10-CM | POA: Diagnosis not present

## 2014-11-07 DIAGNOSIS — Z85828 Personal history of other malignant neoplasm of skin: Secondary | ICD-10-CM | POA: Diagnosis not present

## 2014-11-20 ENCOUNTER — Ambulatory Visit: Payer: Self-pay | Admitting: Internal Medicine

## 2014-11-26 DIAGNOSIS — H66009 Acute suppurative otitis media without spontaneous rupture of ear drum, unspecified ear: Secondary | ICD-10-CM | POA: Diagnosis not present

## 2014-12-12 ENCOUNTER — Other Ambulatory Visit: Payer: Self-pay | Admitting: *Deleted

## 2014-12-12 MED ORDER — ROSUVASTATIN CALCIUM 40 MG PO TABS: 40.0000 mg | ORAL_TABLET | Freq: Every day | ORAL | Status: DC

## 2014-12-22 ENCOUNTER — Ambulatory Visit (INDEPENDENT_AMBULATORY_CARE_PROVIDER_SITE_OTHER): Payer: Medicare Other | Admitting: Internal Medicine

## 2014-12-22 ENCOUNTER — Encounter: Payer: Self-pay | Admitting: Internal Medicine

## 2014-12-22 VITALS — BP 122/72 | HR 60 | Temp 97.2°F | Resp 16 | Ht 73.0 in | Wt 224.4 lb

## 2014-12-22 DIAGNOSIS — F32A Depression, unspecified: Secondary | ICD-10-CM

## 2014-12-22 DIAGNOSIS — R7309 Other abnormal glucose: Secondary | ICD-10-CM | POA: Diagnosis not present

## 2014-12-22 DIAGNOSIS — E559 Vitamin D deficiency, unspecified: Secondary | ICD-10-CM | POA: Diagnosis not present

## 2014-12-22 DIAGNOSIS — E785 Hyperlipidemia, unspecified: Secondary | ICD-10-CM | POA: Diagnosis not present

## 2014-12-22 DIAGNOSIS — I10 Essential (primary) hypertension: Secondary | ICD-10-CM

## 2014-12-22 DIAGNOSIS — E349 Endocrine disorder, unspecified: Secondary | ICD-10-CM

## 2014-12-22 DIAGNOSIS — Z0001 Encounter for general adult medical examination with abnormal findings: Secondary | ICD-10-CM

## 2014-12-22 DIAGNOSIS — R6889 Other general symptoms and signs: Secondary | ICD-10-CM | POA: Diagnosis not present

## 2014-12-22 DIAGNOSIS — R7303 Prediabetes: Secondary | ICD-10-CM

## 2014-12-22 DIAGNOSIS — Z79899 Other long term (current) drug therapy: Secondary | ICD-10-CM

## 2014-12-22 DIAGNOSIS — F329 Major depressive disorder, single episode, unspecified: Secondary | ICD-10-CM

## 2014-12-22 DIAGNOSIS — Z0000 Encounter for general adult medical examination without abnormal findings: Secondary | ICD-10-CM | POA: Diagnosis not present

## 2014-12-22 DIAGNOSIS — G4733 Obstructive sleep apnea (adult) (pediatric): Secondary | ICD-10-CM

## 2014-12-22 DIAGNOSIS — Z9181 History of falling: Secondary | ICD-10-CM

## 2014-12-22 DIAGNOSIS — Z1331 Encounter for screening for depression: Secondary | ICD-10-CM

## 2014-12-22 LAB — CBC WITH DIFFERENTIAL/PLATELET
Basophils Absolute: 0 10*3/uL (ref 0.0–0.1)
Basophils Relative: 0 % (ref 0–1)
Eosinophils Absolute: 0.1 10*3/uL (ref 0.0–0.7)
Eosinophils Relative: 1 % (ref 0–5)
HEMATOCRIT: 44.6 % (ref 39.0–52.0)
HEMOGLOBIN: 15 g/dL (ref 13.0–17.0)
LYMPHS ABS: 2.4 10*3/uL (ref 0.7–4.0)
Lymphocytes Relative: 42 % (ref 12–46)
MCH: 30 pg (ref 26.0–34.0)
MCHC: 33.6 g/dL (ref 30.0–36.0)
MCV: 89.2 fL (ref 78.0–100.0)
MONOS PCT: 7 % (ref 3–12)
MPV: 10.6 fL (ref 8.6–12.4)
Monocytes Absolute: 0.4 10*3/uL (ref 0.1–1.0)
Neutro Abs: 2.8 10*3/uL (ref 1.7–7.7)
Neutrophils Relative %: 50 % (ref 43–77)
Platelets: 220 10*3/uL (ref 150–400)
RBC: 5 MIL/uL (ref 4.22–5.81)
RDW: 13.8 % (ref 11.5–15.5)
WBC: 5.6 10*3/uL (ref 4.0–10.5)

## 2014-12-22 LAB — HEPATIC FUNCTION PANEL
ALBUMIN: 4.3 g/dL (ref 3.5–5.2)
ALT: 31 U/L (ref 0–53)
AST: 27 U/L (ref 0–37)
Alkaline Phosphatase: 51 U/L (ref 39–117)
Bilirubin, Direct: 0.1 mg/dL (ref 0.0–0.3)
Indirect Bilirubin: 0.5 mg/dL (ref 0.2–1.2)
Total Bilirubin: 0.6 mg/dL (ref 0.2–1.2)
Total Protein: 7.3 g/dL (ref 6.0–8.3)

## 2014-12-22 LAB — LIPID PANEL
Cholesterol: 222 mg/dL — ABNORMAL HIGH (ref 0–200)
HDL: 43 mg/dL (ref >=40)
LDL Cholesterol: 135 mg/dL — ABNORMAL HIGH (ref 0–99)
TRIGLYCERIDES: 219 mg/dL — AB (ref <150)
Total CHOL/HDL Ratio: 5.2 Ratio
VLDL: 44 mg/dL — ABNORMAL HIGH (ref 0–40)

## 2014-12-22 LAB — BASIC METABOLIC PANEL WITH GFR
BUN: 9 mg/dL (ref 6–23)
CO2: 27 mEq/L (ref 19–32)
Calcium: 9.6 mg/dL (ref 8.4–10.5)
Chloride: 99 mEq/L (ref 96–112)
Creat: 1.35 mg/dL (ref 0.50–1.35)
GFR, EST AFRICAN AMERICAN: 63 mL/min
GFR, Est Non African American: 54 mL/min — ABNORMAL LOW
GLUCOSE: 131 mg/dL — AB (ref 70–99)
Potassium: 4.3 mEq/L (ref 3.5–5.3)
SODIUM: 137 meq/L (ref 135–145)

## 2014-12-22 LAB — MAGNESIUM: Magnesium: 2.1 mg/dL (ref 1.5–2.5)

## 2014-12-22 NOTE — Patient Instructions (Signed)

## 2014-12-23 ENCOUNTER — Encounter: Payer: Self-pay | Admitting: Internal Medicine

## 2014-12-23 LAB — TSH: TSH: 2.483 u[IU]/mL (ref 0.350–4.500)

## 2014-12-23 LAB — HEMOGLOBIN A1C
HEMOGLOBIN A1C: 5.8 % — AB (ref <5.7)
Mean Plasma Glucose: 120 mg/dL — ABNORMAL HIGH (ref <117)

## 2014-12-23 LAB — INSULIN, RANDOM: Insulin: 30.3 u[IU]/mL — ABNORMAL HIGH (ref 2.0–19.6)

## 2014-12-23 LAB — VITAMIN D 25 HYDROXY (VIT D DEFICIENCY, FRACTURES): VIT D 25 HYDROXY: 47 ng/mL (ref 30–100)

## 2014-12-23 NOTE — Progress Notes (Signed)
Patient ID: CHRISTROPHER GINTZ, male   DOB: 21-Aug-1946, 69 y.o.   MRN: [id number]  MEDICARE ANNUAL WELLNESS VISIT AND OV  Assessment:   1. Essential hypertension  - TSH  2. Hyperlipidemia  - Lipid panel  3. Prediabetes  - Hemoglobin A1c - Insulin, random  4. Vitamin D deficiency  - Vit D  25 hydroxy   5. Testosterone deficiency   6. OSA (obstructive sleep apnea)   7. Depression screen  - Screen Negative  8. Depression, controlled   9. At low risk for fall   10. Medication management  - CBC with Differential/Platelet  11. Fibromyalgia -    12. Routine general medical examination at a health care facility  - BASIC METABOLIC PANEL WITH GFR - Hepatic function panel - Magnesium  Plan:   During the course of the visit the patient was educated and counseled about appropriate screening and preventive services including:    Pneumococcal vaccine   Influenza vaccine  Td vaccine  Screening electrocardiogram  Bone densitometry screening  Colorectal cancer screening  Diabetes screening  Glaucoma screening  Nutrition counseling   Advanced directives: requested  Screening recommendations, referrals: Vaccinations: Immunization History  Administered Date(s) Administered  . Pneumococcal-Unspecified 07/16/2011  . Tdap 01/13/2010  . Zoster 08/09/2013  Influenza vaccine - Declines Prevnar vaccine ordered Hep B vaccine not indicated  Nutrition assessed and recommended  Colonoscopy 2013 Recommended yearly ophthalmology/optometry visit for glaucoma screening and checkup Recommended yearly dental visit for hygiene and checkup Advanced directives - yes  Conditions/risks identified: BMI: Discussed weight loss, diet, and increase physical activity.  Increase physical activity: AHA recommends 150 minutes of physical activity a week.  Medications reviewed PreDiabetes is not at goal, ACE/ARB therapy: Yes. Urinary Incontinence is not an issue: discussed  non pharmacology and pharmacology options.  Fall risk: low- discussed PT, home fall assessment, medications.   Subjective:    Leone P Matheney presents for [employer] Visit and )V.  Date of last medicare wellness visit is unknown. his very nice 69 y.o. MWM presents for follow up with Hypertension, Hyperlipidemia, Pre-Diabetes and Vitamin D Deficiency.    Patient is treated for HTN since 1993 & BP has been controlled at home. Today's BP: 122/72 mmHg. Patient has had no complaints of any cardiac type chest pain, palpitations, dyspnea/orthopnea/PND, dizziness, claudication, or dependent edema.   Hyperlipidemia is controlled with diet & meds. Patient denies myalgias or other med SE's. Last Lipids were not at goal -  Total Chol 222; HDL 43; LDL 135; with elevated  Trig 219 on 12/22/2014:   Also, the patient has history of PreDiabetes since April 2011 with A1c 5.7% and has had no symptoms of reactive hypoglycemia, diabetic polys, paresthesias or visual blurring.  Last A1c was 5.8% on 12/22/2014.   Further, the patient also has history of Vitamin D Deficiency and supplements vitamin D without any suspected side-effects. Last vitamin D was  47 on 12/22/2014.  Names of Other Physician/Practitioners you currently use: 1. [facility] here for primary care 2. Dr Katy Fitch, eye doctor, last visit 2012 - encouraged to get eye exam 3. Dr Daneen Schick, DDS , dentist, last visit Feb 2016  Patient Care Team: [name], MD as PCP - General (Internal Medicine) Druscilla Brownie, MD as Consulting Physician (Dermatology) Teena Irani, MD as Consulting Physician (Gastroenterology) Newman Pies, MD as Consulting Physician (Neurosurgery) Clent Jacks, MD as Consulting Physician (Ophthalmology)  Medication Review: Medication Sig  . ALPRAZolam (XANAX) 0.5 MG  tablet Take 1 tablet (0.5 mg total) by mouth 3 (three) times daily as needed for anxiety or sleep.  Marland Kitchen  aspirin EC 81 MG tablet Take 81 mg by mouth daily.  Marland Kitchen buPROPion  XL) 300 MG 24 hr tablet Take 1 tablet (300 mg total) by mouth daily.  . enalapril  20 MG tablet Take 1 tablet by mouth  every day for blood  pressure  . FLAX SEED  1000 MG CAPS Take 1 capsule by mouth 2 (two) times daily.   . rosuvastatin (CRESTOR) 40 MG tablet Take 1 tablet (40 mg total) by mouth daily.  . Vitamin D, Ergocalciferol, (DRISDOL) 50000 UNITS CAPS capsule Take 50,000 Units by mouth daily.    Current Problems (verified) Patient Active Problem List   Diagnosis Date Noted  . Depression, controlled 12/23/2014  . Medication management 05/03/2014  . Vitamin D deficiency 05/03/2014  . Hypertension   . Hyperlipidemia   . GERD (gastroesophageal reflux disease)   . Prediabetes   . IBS (irritable bowel syndrome)   . OSA (obstructive sleep apnea)   . Fibromyalgia   . BPH (benign prostatic hyperplasia)   . Testosterone deficiency    Screening Tests Health Maintenance  Topic Date Due  . COLONOSCOPY  08/19/1998  . PNA vac Low Risk Adult (1 of 2 - PCV13) 08/19/2013  . INFLUENZA VACCINE  05/06/2014  . TETANUS/TDAP  01/14/2020  . ZOSTAVAX  Completed   Immunization History  Administered Date(s) Administered  . Pneumococcal-Unspecified 07/16/2011  . Tdap 01/13/2010  . Zoster 08/09/2013   Preventative care: Last colonoscopy: 2013  History reviewed: allergies, current medications, past family history, past medical history, past social history, past surgical history and problem list  Risk Factors: Tobacco History  Substance Use Topics  . Smoking status: Former Smoker -- 30 years    Types: Cigarettes    Quit date: 10/06/1980  . Smokeless tobacco: Not on file  . Alcohol Use: Yes     Comment: Beer- 12 cans per week   He does not smoke.  Patient is a former smoker. Are there smokers in your home (other than you)?  Yes  Alcohol Current alcohol use: 2 beers per day(s)  Caffeine Current caffeine use: coffee 1  cup /day  Exercise Current exercise: walking  Nutrition/Diet Current diet: in general, an "unhealthy" diet  Cardiac risk factors: advanced age (older than 53 for men, 69 for women), dyslipidemia, family history of premature cardiovascular disease, hypertension, male gender, obesity (BMI >= 30 kg/m2), sedentary lifestyle and smoking/ tobacco exposure.  Depression Screen (Note: if answer to either of the following is "Yes", a more complete depression screening is indicated)   Q1: Over the past two weeks, have you felt down, depressed or hopeless? No  Q2: Over the past two weeks, have you felt little interest or pleasure in doing things? No  Have you lost interest or pleasure in daily life? No  Do you often feel hopeless? No  Do you cry easily over simple problems? No  Activities of Daily Living In your present state of health, do you have any difficulty performing the following activities?:  Driving? No Managing money?  No Feeding yourself? No Getting from bed to chair? No Climbing a flight of stairs? No Preparing food and eating?: No Bathing or showering? No Getting dressed: No Getting to the toilet? No Using the toilet:No Moving around from place to place: No In the past year have you fallen or had a near fall?:No   Are  you sexually active?  No  Do you have more than one partner?  No  Vision Difficulties: No  Hearing Difficulties: No Do you often ask people to speak up or repeat themselves? No Do you experience ringing or noises in your ears? No Do you have difficulty understanding soft or whispered voices? No  Cognition  Do you feel that you have a problem with memory?No  Do you often misplace items? No  Do you feel safe at home?  Yes  Advanced directives Does patient have a Bertie? Yes Does patient have a Living Will? Yes  Past Medical History  Diagnosis Date  . Hypertension   . Hyperlipidemia   . GERD (gastroesophageal reflux disease)    . Elevated hemoglobin A1c   . IBS (irritable bowel syndrome)   . OSA (obstructive sleep apnea)   . Fibromyalgia   . BPH (benign prostatic hyperplasia)   . Hypogonadism male     Past Surgical History  Procedure Laterality Date  . Spine surgery  2007    L5 S 1 Disk  . Knee arthroscopy Left 1999  .  nasal smr np3  1985  . Vasectomy  1983    ROS: Constitutional: Denies fever, chills, weight loss/gain, headaches, insomnia, fatigue, night sweats or change in appetite. Eyes: Denies redness, blurred vision, diplopia, discharge, itchy or watery eyes.  ENT: Denies discharge, congestion, post nasal drip, epistaxis, sore throat, earache, hearing loss, dental pain, Tinnitus, Vertigo, Sinus pain or snoring.  Cardio: Denies chest pain, palpitations, irregular heartbeat, syncope, dyspnea, diaphoresis, orthopnea, PND, claudication or edema Respiratory: denies cough, dyspnea, DOE, pleurisy, hoarseness, laryngitis or wheezing.  Gastrointestinal: Denies dysphagia, heartburn, reflux, water brash, pain, cramps, nausea, vomiting, bloating, diarrhea, constipation, hematemesis, melena, hematochezia, jaundice or hemorrhoids Genitourinary: Denies dysuria, frequency, urgency, nocturia, hesitancy, discharge, hematuria or flank pain Musculoskeletal: c/o morning  arthralgia, myalgia, stiffness,  Pain, but no limps or strain/sprain. Denies Falls. Skin: Denies puritis, rash, hives, warts, acne, eczema or change in skin lesion Neuro: No weakness, tremor, incoordination, spasms, paresthesia or pain Psychiatric: Denies confusion, memory loss or sensory loss. Denies Depression. Endocrine: Denies change in weight, skin, hair change, nocturia, and paresthesia, diabetic polys, visual blurring or hyper / hypo glycemic episodes.  Heme/Lymph: No excessive bleeding, bruising or enlarged lymph nodes.  Objective:     BP 122/72   Pulse 60  Temp 97.2 F   Resp 16  Ht 6\' 1"    Wt 224 lb 6.4 oz     BMI 29.61   General  Appearance:  Alert  WD/WN, male , in no apparent distress. Eyes: PERRLA, EOMs, conjunctiva no swelling or erythema, normal fundi and vessels. Sinuses: No frontal/maxillary tenderness ENT/Mouth: EACs patent / TMs  nl. Nares clear without erythema, swelling, mucoid exudates. Oral hygiene is good. No erythema, swelling, or exudate. Tongue normal, non-obstructing. Tonsils not swollen or erythematous. Hearing normal.  Neck: Supple, thyroid normal. No bruits, nodes or JVD. Respiratory: Respiratory effort normal.  BS equal and clear bilateral without rales, rhonci, wheezing or stridor. Cardio: Heart sounds are normal with regular rate and rhythm and no murmurs, rubs or gallops. Peripheral pulses are normal and equal bilaterally without edema. No aortic or femoral bruits. Chest: symmetric with normal excursions and percussion.  Abdomen: Flat, soft, with nl bowel sounds. Nontender, no guarding, rebound, hernias, masses, or organomegaly.  Lymphatics: Non tender without lymphadenopathy.  Musculoskeletal: Full ROM all peripheral extremities, joint stability, 5/5 strength, and normal gait. Skin: Warm and dry without  rashes, lesions, cyanosis, clubbing or  ecchymosis.  Neuro: Cranial nerves intact, reflexes equal bilaterally. Normal muscle tone, no cerebellar symptoms. Sensation intact.  Pysch: Awake and oriented X 3 with normal affect, insight and judgment appropriate.   Cognitive Testing  Alert? Yes  Normal Appearance? Yes  Oriented to person? Yes  Place? Yes   Time? Yes  Recall of three objects?  Yes  Can perform simple calculations? Yes  Displays appropriate judgment? Yes  Can read the correct time from a watch/clock? Yes  Medicare Attestation I have personally reviewed: The patient's medical and social history Their use of alcohol, tobacco or illicit drugs Their current medications and supplements The patient's functional ability including ADLs,fall risks, home safety risks, cognitive, and  hearing and visual impairment Diet and physical activities Evidence for depression or mood disorders  The patient's weight, height, BMI, and visual acuity have been recorded in the chart.  I have made referrals, counseling, and provided education to the patient based on review of the above and I have provided the patient with a written personalized care plan for preventive services.    Daxon Kyne DAVID, MD   12/23/2014

## 2015-03-27 ENCOUNTER — Ambulatory Visit (INDEPENDENT_AMBULATORY_CARE_PROVIDER_SITE_OTHER): Payer: Medicare Other | Admitting: Internal Medicine

## 2015-03-27 ENCOUNTER — Encounter: Payer: Self-pay | Admitting: Internal Medicine

## 2015-03-27 VITALS — BP 124/72 | HR 58 | Temp 98.4°F | Resp 18 | Ht 73.0 in | Wt 221.0 lb

## 2015-03-27 DIAGNOSIS — E559 Vitamin D deficiency, unspecified: Secondary | ICD-10-CM

## 2015-03-27 DIAGNOSIS — Z79899 Other long term (current) drug therapy: Secondary | ICD-10-CM | POA: Diagnosis not present

## 2015-03-27 DIAGNOSIS — I10 Essential (primary) hypertension: Secondary | ICD-10-CM

## 2015-03-27 DIAGNOSIS — E349 Endocrine disorder, unspecified: Secondary | ICD-10-CM

## 2015-03-27 DIAGNOSIS — N400 Enlarged prostate without lower urinary tract symptoms: Secondary | ICD-10-CM | POA: Diagnosis not present

## 2015-03-27 DIAGNOSIS — E291 Testicular hypofunction: Secondary | ICD-10-CM

## 2015-03-27 DIAGNOSIS — R7309 Other abnormal glucose: Secondary | ICD-10-CM | POA: Diagnosis not present

## 2015-03-27 DIAGNOSIS — E785 Hyperlipidemia, unspecified: Secondary | ICD-10-CM

## 2015-03-27 DIAGNOSIS — R7303 Prediabetes: Secondary | ICD-10-CM

## 2015-03-27 DIAGNOSIS — E669 Obesity, unspecified: Secondary | ICD-10-CM

## 2015-03-27 LAB — CBC WITH DIFFERENTIAL/PLATELET
Basophils Absolute: 0 10*3/uL (ref 0.0–0.1)
Basophils Relative: 0 % (ref 0–1)
EOS PCT: 1 % (ref 0–5)
Eosinophils Absolute: 0.1 10*3/uL (ref 0.0–0.7)
HEMATOCRIT: 44.6 % (ref 39.0–52.0)
HEMOGLOBIN: 15.1 g/dL (ref 13.0–17.0)
LYMPHS PCT: 38 % (ref 12–46)
Lymphs Abs: 2.6 10*3/uL (ref 0.7–4.0)
MCH: 29.8 pg (ref 26.0–34.0)
MCHC: 33.9 g/dL (ref 30.0–36.0)
MCV: 88.1 fL (ref 78.0–100.0)
MONO ABS: 0.5 10*3/uL (ref 0.1–1.0)
MONOS PCT: 7 % (ref 3–12)
MPV: 10.3 fL (ref 8.6–12.4)
Neutro Abs: 3.7 10*3/uL (ref 1.7–7.7)
Neutrophils Relative %: 54 % (ref 43–77)
Platelets: 202 10*3/uL (ref 150–400)
RBC: 5.06 MIL/uL (ref 4.22–5.81)
RDW: 14.1 % (ref 11.5–15.5)
WBC: 6.8 10*3/uL (ref 4.0–10.5)

## 2015-03-27 LAB — BASIC METABOLIC PANEL WITH GFR
BUN: 13 mg/dL (ref 6–23)
CALCIUM: 10 mg/dL (ref 8.4–10.5)
CO2: 25 mEq/L (ref 19–32)
Chloride: 102 mEq/L (ref 96–112)
Creat: 1.39 mg/dL — ABNORMAL HIGH (ref 0.50–1.35)
GFR, EST AFRICAN AMERICAN: 61 mL/min
GFR, EST NON AFRICAN AMERICAN: 52 mL/min — AB
Glucose, Bld: 105 mg/dL — ABNORMAL HIGH (ref 70–99)
POTASSIUM: 4.4 meq/L (ref 3.5–5.3)
Sodium: 139 mEq/L (ref 135–145)

## 2015-03-27 LAB — MAGNESIUM: MAGNESIUM: 2.3 mg/dL (ref 1.5–2.5)

## 2015-03-27 LAB — LIPID PANEL
CHOLESTEROL: 179 mg/dL (ref 0–200)
HDL: 54 mg/dL (ref >=40)
LDL Cholesterol: 89 mg/dL (ref 0–99)
TRIGLYCERIDES: 179 mg/dL — AB (ref <150)
Total CHOL/HDL Ratio: 3.3 Ratio
VLDL: 36 mg/dL (ref 0–40)

## 2015-03-27 LAB — HEPATIC FUNCTION PANEL
ALK PHOS: 47 U/L (ref 39–117)
ALT: 35 U/L (ref 0–53)
AST: 31 U/L (ref 0–37)
Albumin: 4.7 g/dL (ref 3.5–5.2)
BILIRUBIN INDIRECT: 0.6 mg/dL (ref 0.2–1.2)
BILIRUBIN TOTAL: 0.7 mg/dL (ref 0.2–1.2)
Bilirubin, Direct: 0.1 mg/dL (ref 0.0–0.3)
TOTAL PROTEIN: 7.5 g/dL (ref 6.0–8.3)

## 2015-03-27 LAB — TSH: TSH: 2.876 u[IU]/mL (ref 0.350–4.500)

## 2015-03-27 NOTE — Progress Notes (Signed)
Patient ID: Cody Hines, male   DOB: 02-Jul-1946, 69 y.o.   MRN: [id number]  Assessment and Plan:  Hypertension:  -Continue medication,  -monitor blood pressure at home.  -Continue DASH diet.   -Reminder to go to the ER if any CP, SOB, nausea, dizziness, severe HA, changes vision/speech, left arm numbness and tingling, and jaw pain.  Cholesterol: -Continue diet and exercise.  -Check cholesterol.   Pre-diabetes: -Continue diet and exercise.  -Check A1C  Vitamin D Def: -check level -continue medications.   Continue diet and meds as discussed. Further disposition pending results of labs.  HPI 69 y.o. male  presents for 3 month follow up with hypertension, hyperlipidemia, prediabetes and vitamin D.   His blood pressure has been controlled at home, today their BP is BP: 124/72 mmHg.   He does workout.  He reports that he his not formally exercising but he is running around with his grandkids. He denies chest pain, shortness of breath, dizziness.   He is on cholesterol medication and denies myalgias. His cholesterol is not at goal. The cholesterol last visit was:   Lab Results  Component Value Date   CHOL 222* 12/22/2014   HDL 43 12/22/2014   LDLCALC 135* 12/22/2014   TRIG 219* 12/22/2014   CHOLHDL 5.2 12/22/2014     He has been working on diet and exercise for prediabetes, and denies foot ulcerations, hyperglycemia, hypoglycemia , increased appetite, nausea, paresthesia of the feet, polydipsia, polyuria, visual disturbances, vomiting and weight loss. Last A1C in the office was:  Lab Results  Component Value Date   HGBA1C 5.8* 12/22/2014    Patient is on Vitamin D supplement.  Lab Results  Component Value Date   VD25OH 47 12/22/2014      Current Medications:  Current Outpatient Prescriptions on File Prior to Visit  Medication Sig Dispense Refill  . ALPRAZolam (XANAX) 0.5 MG tablet Take 1 tablet (0.5 mg total) by mouth 3 (three) times daily as needed for anxiety or  sleep. 90 tablet 1  . aspirin EC 81 MG tablet Take 81 mg by mouth daily.    Marland Kitchen buPROPion (WELLBUTRIN XL) 300 MG 24 hr tablet Take 1 tablet (300 mg total) by mouth daily. 90 tablet 1  . enalapril (VASOTEC) 20 MG tablet Take 1 tablet by mouth  every day for blood  pressure 120 tablet 0  . Flaxseed, Linseed, (FLAX SEED OIL) 1000 MG CAPS Take 1 capsule by mouth 2 (two) times daily.     . rosuvastatin (CRESTOR) 40 MG tablet Take 1 tablet (40 mg total) by mouth daily. 90 tablet 1  . Vitamin D, Ergocalciferol, (DRISDOL) 50000 UNITS CAPS capsule Take 50,000 Units by mouth daily.      No current facility-administered medications on file prior to visit.    Medical History:  Past Medical History  Diagnosis Date  . Hypertension   . Hyperlipidemia   . GERD (gastroesophageal reflux disease)   . Elevated hemoglobin A1c   . IBS (irritable bowel syndrome)   . OSA (obstructive sleep apnea)   . Fibromyalgia   . BPH (benign prostatic hyperplasia)   . Hypogonadism male     Allergies:  Allergies  Allergen Reactions  . Asa [Aspirin]   . Atorvastatin     Muscle aches  . Celebrex [Celecoxib]   . Codeine   . Penicillins   . Savella [Milnacipran Hcl]   . Viagra [Sildenafil Citrate] Other (See Comments)    Headache     Review of  Systems:  Review of Systems  Constitutional: Negative for fever, chills and malaise/fatigue.  HENT: Negative for congestion, nosebleeds and sore throat.   Eyes: Negative.   Respiratory: Negative for cough, shortness of breath and wheezing.   Cardiovascular: Negative for chest pain, palpitations and leg swelling.  Gastrointestinal: Negative for heartburn, nausea, vomiting, diarrhea, constipation, blood in stool and melena.  Genitourinary: Negative.   Skin: Negative.   Neurological: Negative for dizziness, sensory change, loss of consciousness and headaches.  Psychiatric/Behavioral: Negative for depression. The patient is not nervous/anxious and does not have insomnia.      Family history- Review and unchanged  Social history- Review and unchanged  Physical Exam: BP 124/72 mmHg  Pulse 58  Temp(Src) 98.4 F (36.9 C) (Temporal)  Resp 18  Ht 6\' 1"  (1.854 m)  Wt 221 lb (100.245 kg)  BMI 29.16 kg/m2 Wt Readings from Last 3 Encounters:  03/27/15 221 lb (100.245 kg)  12/22/14 224 lb 6.4 oz (101.787 kg)  08/15/14 224 lb (101.606 kg)    General Appearance: Well nourished well developed, in no apparent distress. Eyes: PERRLA, EOMs, conjunctiva no swelling or erythema ENT/Mouth: Ear canals normal without obstruction, swelling, erythma, discharge.  TMs normal bilaterally.  Oropharynx moist, clear, without exudate, or postoropharyngeal swelling. Neck: Supple, thyroid normal,no cervical adenopathy  Respiratory: Respiratory effort normal, Breath sounds clear A&P without rhonchi, wheeze, or rale.  No retractions, no accessory usage. Cardio: RRR with no MRGs. Brisk peripheral pulses without edema.  Abdomen: Soft, + BS,  Non tender, no guarding, rebound, hernias, masses. Musculoskeletal: Full ROM, 5/5 strength, Normal gait Skin: Warm, dry without rashes, lesions, ecchymosis.  Neuro: Awake and oriented X 3, Cranial nerves intact. Normal muscle tone, no cerebellar symptoms. Psych: Normal affect, Insight and Judgment appropriate.    FORCUCCI, Kaelin Holford, PA-C 11:22 AM [facility]

## 2015-03-27 NOTE — Patient Instructions (Addendum)
I will look into seeing if we can do the EPAP or the nasal CPAP possibility  Keep working on diet and exercise.

## 2015-03-28 LAB — VITAMIN D 25 HYDROXY (VIT D DEFICIENCY, FRACTURES): Vit D, 25-Hydroxy: 70 ng/mL (ref 30–100)

## 2015-03-28 LAB — HEMOGLOBIN A1C
Hgb A1c MFr Bld: 6.1 % — ABNORMAL HIGH (ref <5.7)
MEAN PLASMA GLUCOSE: 128 mg/dL — AB (ref <117)

## 2015-03-28 LAB — INSULIN, RANDOM: Insulin: 17.8 u[IU]/mL (ref 2.0–19.6)

## 2015-04-02 ENCOUNTER — Other Ambulatory Visit: Payer: Self-pay

## 2015-04-15 ENCOUNTER — Other Ambulatory Visit: Payer: Self-pay | Admitting: Physician Assistant

## 2015-04-15 DIAGNOSIS — I10 Essential (primary) hypertension: Secondary | ICD-10-CM

## 2015-05-02 ENCOUNTER — Other Ambulatory Visit: Payer: Self-pay | Admitting: Internal Medicine

## 2015-05-08 DIAGNOSIS — Z85828 Personal history of other malignant neoplasm of skin: Secondary | ICD-10-CM | POA: Diagnosis not present

## 2015-05-08 DIAGNOSIS — L570 Actinic keratosis: Secondary | ICD-10-CM | POA: Diagnosis not present

## 2015-05-08 DIAGNOSIS — Z08 Encounter for follow-up examination after completed treatment for malignant neoplasm: Secondary | ICD-10-CM | POA: Diagnosis not present

## 2015-05-14 ENCOUNTER — Encounter: Payer: Self-pay | Admitting: Internal Medicine

## 2015-06-30 ENCOUNTER — Other Ambulatory Visit: Payer: Self-pay | Admitting: Internal Medicine

## 2015-07-09 DIAGNOSIS — H11153 Pinguecula, bilateral: Secondary | ICD-10-CM | POA: Diagnosis not present

## 2015-08-04 ENCOUNTER — Other Ambulatory Visit: Payer: Self-pay | Admitting: Physician Assistant

## 2015-08-10 ENCOUNTER — Ambulatory Visit (INDEPENDENT_AMBULATORY_CARE_PROVIDER_SITE_OTHER): Payer: Medicare Other | Admitting: Internal Medicine

## 2015-08-10 ENCOUNTER — Encounter: Payer: Self-pay | Admitting: Internal Medicine

## 2015-08-10 VITALS — BP 122/68 | HR 64 | Temp 97.9°F | Resp 16 | Ht 73.0 in | Wt 221.6 lb

## 2015-08-10 DIAGNOSIS — E559 Vitamin D deficiency, unspecified: Secondary | ICD-10-CM | POA: Diagnosis not present

## 2015-08-10 DIAGNOSIS — F329 Major depressive disorder, single episode, unspecified: Secondary | ICD-10-CM

## 2015-08-10 DIAGNOSIS — Z125 Encounter for screening for malignant neoplasm of prostate: Secondary | ICD-10-CM | POA: Diagnosis not present

## 2015-08-10 DIAGNOSIS — Z0000 Encounter for general adult medical examination without abnormal findings: Secondary | ICD-10-CM | POA: Insufficient documentation

## 2015-08-10 DIAGNOSIS — M545 Low back pain, unspecified: Secondary | ICD-10-CM

## 2015-08-10 DIAGNOSIS — K219 Gastro-esophageal reflux disease without esophagitis: Secondary | ICD-10-CM

## 2015-08-10 DIAGNOSIS — R7303 Prediabetes: Secondary | ICD-10-CM | POA: Diagnosis not present

## 2015-08-10 DIAGNOSIS — Z9989 Dependence on other enabling machines and devices: Secondary | ICD-10-CM

## 2015-08-10 DIAGNOSIS — Z9181 History of falling: Secondary | ICD-10-CM

## 2015-08-10 DIAGNOSIS — E785 Hyperlipidemia, unspecified: Secondary | ICD-10-CM

## 2015-08-10 DIAGNOSIS — E669 Obesity, unspecified: Secondary | ICD-10-CM | POA: Insufficient documentation

## 2015-08-10 DIAGNOSIS — R7309 Other abnormal glucose: Secondary | ICD-10-CM | POA: Diagnosis not present

## 2015-08-10 DIAGNOSIS — N320 Bladder-neck obstruction: Secondary | ICD-10-CM | POA: Diagnosis not present

## 2015-08-10 DIAGNOSIS — Z789 Other specified health status: Secondary | ICD-10-CM | POA: Diagnosis not present

## 2015-08-10 DIAGNOSIS — I10 Essential (primary) hypertension: Secondary | ICD-10-CM | POA: Diagnosis not present

## 2015-08-10 DIAGNOSIS — E349 Endocrine disorder, unspecified: Secondary | ICD-10-CM

## 2015-08-10 DIAGNOSIS — F32A Depression, unspecified: Secondary | ICD-10-CM

## 2015-08-10 DIAGNOSIS — K589 Irritable bowel syndrome without diarrhea: Secondary | ICD-10-CM

## 2015-08-10 DIAGNOSIS — Z1212 Encounter for screening for malignant neoplasm of rectum: Secondary | ICD-10-CM

## 2015-08-10 DIAGNOSIS — Z79899 Other long term (current) drug therapy: Secondary | ICD-10-CM | POA: Diagnosis not present

## 2015-08-10 DIAGNOSIS — Z6829 Body mass index (BMI) 29.0-29.9, adult: Secondary | ICD-10-CM

## 2015-08-10 DIAGNOSIS — G4733 Obstructive sleep apnea (adult) (pediatric): Secondary | ICD-10-CM

## 2015-08-10 LAB — CBC WITH DIFFERENTIAL/PLATELET
BASOS ABS: 0 10*3/uL (ref 0.0–0.1)
Basophils Relative: 0 % (ref 0–1)
EOS ABS: 0.2 10*3/uL (ref 0.0–0.7)
EOS PCT: 2 % (ref 0–5)
HEMATOCRIT: 47.1 % (ref 39.0–52.0)
Hemoglobin: 15.5 g/dL (ref 13.0–17.0)
LYMPHS ABS: 3 10*3/uL (ref 0.7–4.0)
LYMPHS PCT: 39 % (ref 12–46)
MCH: 29.7 pg (ref 26.0–34.0)
MCHC: 32.9 g/dL (ref 30.0–36.0)
MCV: 90.2 fL (ref 78.0–100.0)
MONOS PCT: 7 % (ref 3–12)
MPV: 9.9 fL (ref 8.6–12.4)
Monocytes Absolute: 0.5 10*3/uL (ref 0.1–1.0)
Neutro Abs: 4 10*3/uL (ref 1.7–7.7)
Neutrophils Relative %: 52 % (ref 43–77)
PLATELETS: 243 10*3/uL (ref 150–400)
RBC: 5.22 MIL/uL (ref 4.22–5.81)
RDW: 13.7 % (ref 11.5–15.5)
WBC: 7.7 10*3/uL (ref 4.0–10.5)

## 2015-08-10 LAB — LIPID PANEL
CHOL/HDL RATIO: 3.4 ratio (ref <=5.0)
Cholesterol: 166 mg/dL (ref 125–200)
HDL: 49 mg/dL (ref >=40)
LDL CALC: 64 mg/dL (ref <130)
Triglycerides: 264 mg/dL — ABNORMAL HIGH (ref <150)
VLDL: 53 mg/dL — ABNORMAL HIGH (ref <30)

## 2015-08-10 LAB — HEPATIC FUNCTION PANEL
ALBUMIN: 4.6 g/dL (ref 3.6–5.1)
ALK PHOS: 51 U/L (ref 40–115)
ALT: 28 U/L (ref 9–46)
AST: 25 U/L (ref 10–35)
BILIRUBIN TOTAL: 0.7 mg/dL (ref 0.2–1.2)
Bilirubin, Direct: 0.2 mg/dL (ref <=0.2)
Indirect Bilirubin: 0.5 mg/dL (ref 0.2–1.2)
Total Protein: 7.3 g/dL (ref 6.1–8.1)

## 2015-08-10 LAB — BASIC METABOLIC PANEL WITH GFR
BUN: 11 mg/dL (ref 7–25)
CHLORIDE: 99 mmol/L (ref 98–110)
CO2: 27 mmol/L (ref 20–31)
CREATININE: 1.48 mg/dL — AB (ref 0.70–1.25)
Calcium: 9.4 mg/dL (ref 8.6–10.3)
GFR, Est African American: 56 mL/min — ABNORMAL LOW (ref >=60)
GFR, Est Non African American: 49 mL/min — ABNORMAL LOW (ref >=60)
GLUCOSE: 105 mg/dL — AB (ref 65–99)
POTASSIUM: 4.3 mmol/L (ref 3.5–5.3)
Sodium: 135 mmol/L (ref 135–146)

## 2015-08-10 LAB — TSH: TSH: 1.787 u[IU]/mL (ref 0.350–4.500)

## 2015-08-10 LAB — MAGNESIUM: MAGNESIUM: 2.2 mg/dL (ref 1.5–2.5)

## 2015-08-10 MED ORDER — PREDNISONE 20 MG PO TABS: ORAL_TABLET | ORAL | Status: DC

## 2015-08-10 MED ORDER — CYCLOBENZAPRINE HCL 10 MG PO TABS: ORAL_TABLET | ORAL | Status: DC

## 2015-08-10 MED ORDER — HYDROCODONE-ACETAMINOPHEN 5-325 MG PO TABS: ORAL_TABLET | ORAL | Status: DC

## 2015-08-10 NOTE — Progress Notes (Signed)
Patient ID: Cody Hines, male   DOB: [birth year], 69 y.o.   MRN: [id number]   Comprehensive Evaluation, Examination & Management      This very nice 69 y.o. MWM presents for presents for a  comprehensive evaluation and management of multiple medical co-morbidities.  Patient has been followed for HTN, T2_NIDDM, Hyperlipidemia, and Vitamin D Deficiency. Patient also presented with a 2 day hx/o unprovoked Left LBP w/o sciatica- initially at 10/10 and 4/10 in severity of pain. Has remote prior Lumbar HNP surg by Dr Arnoldo Morale. Patient is also on CPAP for OSA with improved sleep hygiene, energy level and sense of well being.                                                               HTN predates since 79. Patient's BP has been controlled at home.Today's BP: 122/68 mmHg. In 2005 patient had a FALSE (+) Cardiolite revealed by Normal Heart Cath. Patient denies any cardiac symptoms as chest pain, palpitations, shortness of breath, dizziness or ankle swelling.       Patient's hyperlipidemia is controlled with diet and medications. Patient denies myalgias or other medication SE's. Last lipids were at goal with  Cholesterol 179; HDL 54; LDL 89; and with sl elevated Triglycerides 179 on 03/27/2015.       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 Last A1c was  6.1% on 03/27/2015.        Finally, patient has history of Vitamin D Deficiency of 29 in 2008 and last vitamin D was at goal with level of 70 on 03/27/2015.   Medication Sig  . ALPRAZolam (XANAX) 0.5 MG tablet Take 1 tablet (0.5 mg total) by mouth 3 (three) times daily as needed for anxiety or sleep.  Marland Kitchen aspirin EC 81 MG tablet Take 81 mg by mouth daily.  Marland Kitchen buPROPion (WELLBUTRIN XL) 300 MG 24 hr tablet Take 1 tablet by mouth  daily  . enalapril (VASOTEC) 20 MG tablet Take 1 tablet  by mouth  every day for blood  pressure  . Flaxseed, Linseed, (FLAX SEED OIL) 1000 MG CAPS Take 1 capsule by mouth 2 (two) times daily.   . rosuvastatin (CRESTOR) 40 MG tablet Take 1 tablet (40 mg total) by mouth daily.  . Vitamin D, Ergocalciferol, (DRISDOL) 50000 UNITS CAPS capsule Take 50,000 Units by mouth daily.    Allergies  Allergen Reactions  . Asa [Aspirin]   . Atorvastatin     Muscle aches  . Celebrex [Celecoxib]   . Codeine   . Penicillins   . Savella [Milnacipran Hcl]   . Viagra [Sildenafil Citrate] Other (See Comments)    Headache   Past Medical History  Diagnosis Date  . Hypertension   . Hyperlipidemia   . GERD (gastroesophageal reflux disease)   . Elevated hemoglobin A1c   . IBS (irritable bowel syndrome)   . OSA (obstructive sleep apnea)   . Fibromyalgia   . BPH (benign prostatic hyperplasia)   . Hypogonadism male    Health Maintenance  Topic Date Due  . COLONOSCOPY  08/19/1998  . PNA vac Low Risk Adult (  1 of 2 - PCV13) 08/19/2013  . INFLUENZA VACCINE  05/07/2015  . TETANUS/TDAP  01/14/2020  . ZOSTAVAX  Completed  . Hepatitis C Screening  Completed   Immunization History  Administered Date(s) Administered  . Pneumococcal-Unspecified 07/16/2011  . Tdap 01/13/2010  . Zoster 08/09/2013   Past Surgical History  Procedure Laterality Date  . Spine surgery  2007    L5 S 1 Disk  . Knee arthroscopy Left 1999  .  nasal smr np3  1985  . Vasectomy  1983   Family History  Problem Relation Age of Onset  . Cancer Mother     breast  . Cancer Father     lung  . Diabetes Father   . Heart disease Sister   . Arthritis Sister     Social History   Social History  . Marital Status: Single    Spouse Name: N/A  . Number of Children: N/A  . Years of Education: N/A   Occupational History  . Not on file.   Social History Main Topics  . Smoking status: Former Smoker -- 30 years    Types: Cigarettes    Quit date: 10/06/1980  . Smokeless tobacco: Not on  file  . Alcohol Use: Yes     Comment: Beer- 12 cans per week  . Drug Use: No  . Sexual Activity: Active    ROS Constitutional: Denies fever, chills, weight loss/gain, headaches, insomnia,  night sweats or change in appetite. Does c/o fatigue. Eyes: Denies redness, blurred vision, diplopia, discharge, itchy or watery eyes.  ENT: Denies discharge, congestion, post nasal drip, epistaxis, sore throat, earache, hearing loss, dental pain, Tinnitus, Vertigo, Sinus pain or snoring.  Cardio: Denies chest pain, palpitations, irregular heartbeat, syncope, dyspnea, diaphoresis, orthopnea, PND, claudication or edema Respiratory: denies cough, dyspnea, DOE, pleurisy, hoarseness, laryngitis or wheezing.  Gastrointestinal: Denies dysphagia, heartburn, reflux, water brash, pain, cramps, nausea, vomiting, bloating, diarrhea, constipation, hematemesis, melena, hematochezia, jaundice or hemorrhoids Genitourinary: Denies dysuria, frequency, urgency, nocturia, hesitancy, discharge, hematuria or flank pain Musculoskeletal: Denies arthralgia, myalgia, stiffness, Jt. Swelling, pain, limp or strain/sprain. Denies Falls. Skin: Denies puritis, rash, hives, warts, acne, eczema or change in skin lesion Neuro: No weakness, tremor, incoordination, spasms, paresthesia or pain Psychiatric: Denies confusion, memory loss or sensory loss. Denies Depression. Endocrine: Denies change in weight, skin, hair change, nocturia, and paresthesia, diabetic polys, visual blurring or hyper / hypo glycemic episodes.  Heme/Lymph: No excessive bleeding, bruising or enlarged lymph nodes.  Physical Exam  BP 122/68 mmHg  Pulse 64  Temp(Src) 97.9 F (36.6 C)  Resp 16  Ht 6\' 1"  (1.854 m)  Wt 221 lb 9.6 oz (100.517 kg)  BMI 29.24 kg/m2  General Appearance: Well nourished, in no apparent distress. Eyes: PERRLA, EOMs, conjunctiva no swelling or erythema, normal fundi and vessels. Sinuses: No frontal/maxillary tenderness ENT/Mouth: EACs  patent / TMs  nl. Nares clear without erythema, swelling, mucoid exudates. Oral hygiene is good. No erythema, swelling, or exudate. Tongue normal, non-obstructing. Tonsils not swollen or erythematous. Hearing normal.  Neck: Supple, thyroid normal. No bruits, nodes or JVD. Respiratory: Respiratory effort normal.  BS equal and clear bilateral without rales, rhonci, wheezing or stridor. Cardio: Heart sounds are normal with regular rate and rhythm and no murmurs, rubs or gallops. Peripheral pulses are normal and equal bilaterally without edema. No aortic or femoral bruits. Chest: symmetric with normal excursions and percussion.  Abdomen: Flat, soft, with bowl sounds. Nontender, no guarding, rebound, hernias, masses, or organomegaly.  Lymphatics:  Non tender without lymphadenopathy.  Genitourinary: No hernias.Testes nl. DRE - prostate nl for age - smooth & firm w/o nodules. Musculoskeletal: Full ROM all peripheral extremities, joint stability, 5/5 strength, and normal gait. Moderate L>R para-Lumbar tender spasm. Skin: Warm and dry without rashes, lesions, cyanosis, clubbing or  ecchymosis.  Neuro: Cranial nerves intact, reflexes equal bilaterally. Normal muscle tone, no cerebellar symptoms. Sensation intact.  Pysch: Awake and oriented X 3 with normal affect, insight and judgment appropriate.   Assessment and Plan  1. Essential hypertension  - Microalbumin / creatinine urine ratio - EKG 12-Lead - [place], RETROPERITNL ABD,  LTD - TSH  2. Hyperlipidemia  - Lipid panel  3. Prediabetes  - Hemoglobin A1c - Insulin, random  4. Vitamin D deficiency  - Vit D  25 hydroxy   5. Gastroesophageal reflux disease, esophagitis presence not specified   6. OSA on CPAP   7. IBS (irritable bowel syndrome)   8. Testosterone deficiency   9. Depression, controlled   10. Screening for rectal cancer  - POC Hemoccult Bld/Stl (3-Cd Home Screen); Future  11. Prostate cancer screening  - PSA  12.  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 Other abnormal glucose  - Hemoglobin A1c - Insulin, random  15. Back pain, lumbosacral r/o rough   - predniSONE (DELTASONE) 20 MG tablet; 1 tab 3 x day for 3 days, then 1 tab 2 x day for 3 days, then 1 tab 1 x day for 5 days  Dispense: 20 tablet; Refill: 0 - HYDROcodone-acetaminophen (NORCO) 5-325 MG tablet; Take 1/2 to 1 tablet every 3 to 4 hours if needed for severe pain  Dispense: 50 tablet; Refill: 0 - cyclobenzaprine (FLEXERIL) 10 MG tablet; Take 1/2 to 1 tablet 2 to 3 x day if needed for muscle spasm  Dispense: 90 tablet; Refill: 1  16. BMI 29.0-29.9,adult   17. Medication management  - Urinalysis, Routine w reflex microscopic (not at [facility]); Future - CBC with Differential/Platelet - BASIC METABOLIC PANEL WITH GFR - Hepatic function panel - Magnesium   Continue prudent diet as discussed, weight control, BP monitoring, regular exercise, and medications as discussed.  Discussed med effects and SE's. Routine screening labs and tests as requested with regular follow-up as recommended.

## 2015-08-10 NOTE — Patient Instructions (Addendum)
Recommend Adult Low Dose Aspirin or   coated  Aspirin 81 mg daily   To reduce risk of Colon Cancer 20 %,   Skin Cancer 26 % ,   Melanoma 46%   and   Pancreatic cancer 60%   ++++++++++++++++++++++++++++++++++++++++++++++++++++++  Vitamin D goal   is between 70-100.   Please make sure that you are taking your Vitamin D as directed.   It is very important as a natural anti-inflammatory   helping hair, skin, and nails, as well as reducing stroke and heart attack risk.   It helps your bones and helps with mood.  It also decreases numerous cancer risks so please take it as directed.   Low Vit D is associated with a 200-300% higher risk for CANCER   and 200-300% higher risk for HEART   ATTACK  &  STROKE.   ......................................  It is also associated with higher death rate at younger ages,   autoimmune diseases like Rheumatoid arthritis, Lupus, Multiple Sclerosis.     Also many other serious conditions, like depression, Alzheimer's  Dementia, infertility, muscle aches, fatigue, fibromyalgia - just to name a few.  ++++++++++++++++++++++++++++++++++++++++++++++++  Recommend the book "The END of DIETING" by Dr Joel Fuhrman   & the book "The END of DIABETES " by Dr Joel Fuhrman  At [employer] - get book & Audio CD's     Being diabetic has a  300% increased risk for heart attack, stroke, cancer, and alzheimer- type vascular dementia. It is very important that you work harder with diet by avoiding all foods that are white. Avoid white rice (brown & wild rice is OK), white potatoes (sweetpotatoes in moderation is OK), White bread or wheat bread or anything made out of white flour like bagels, donuts, rolls, buns, biscuits, cakes, pastries, cookies, pizza crust, and pasta (made from white flour & egg whites) - vegetarian pasta or spinach or wheat pasta is OK. Multigrain breads like Arnold's or [employer], or multigrain sandwich thins or flatbreads.  Diet,  exercise and weight loss can reverse and cure diabetes in the early stages.  Diet, exercise and weight loss is very important in the control and prevention of complications of diabetes which affects every system in your body, ie. Brain - dementia/stroke, eyes - glaucoma/blindness, heart - heart attack/heart failure, kidneys - dialysis, stomach - gastric paralysis, intestines - malabsorption, nerves - severe painful neuritis, circulation - gangrene & loss of a leg(s), and finally cancer and Alzheimers.    I recommend avoid fried & greasy foods,  sweets/candy, white rice (brown or wild rice or Quinoa is OK), white potatoes (sweet potatoes are OK) - anything made from white flour - bagels, doughnuts, rolls, buns, biscuits,white and wheat breads, pizza crust and traditional pasta made of white flour & egg white(vegetarian pasta or spinach or wheat pasta is OK).  Multi-grain bread is OK - like multi-grain flat bread or sandwich thins. Avoid alcohol in excess. Exercise is also important.    Eat all the vegetables you want - avoid meat, especially red meat and dairy - especially cheese.  Cheese is the most concentrated form of trans-fats which is the worst thing to clog up our arteries. Veggie cheese is OK which can be found in the fresh produce section at [employer] or [employer] or [employer]  ++++++++++++++++++++++++++++++++++++++++++++++++++ DASH Eating Plan  DASH stands for "Dietary Approaches to Stop Hypertension."   The DASH eating plan is a healthy eating plan that has been shown to reduce high   blood pressure (hypertension). Additional health benefits [place] include reducing the risk of type 2 diabetes mellitus, heart disease, and stroke. The DASH eating plan Poet also help with weight loss.  WHAT DO I NEED TO KNOW ABOUT THE DASH EATING PLAN? For the DASH eating plan, you will follow these general guidelines:  Choose foods with a percent daily value for sodium of less than 5% (as listed on the food  label).  Use salt-free seasonings or herbs instead of table salt or sea salt.  Check with your health care provider or pharmacist before using salt substitutes.  Eat lower-sodium products, often labeled as "lower sodium" or "no salt added."  Eat fresh foods.  Eat more vegetables, fruits, and low-fat dairy products.    Choose whole grains. Look for the word "whole" as the first word in the ingredient list.  Choose fish   Limit sweets, desserts, sugars, and sugary drinks.  Choose heart-healthy fats.  Eat veggie cheese   Eat more home-cooked food and less restaurant, buffet, and fast food.  Limit fried foods.  Cook foods using methods other than frying.  Limit canned vegetables. If you do use them, rinse them well to decrease the sodium.  When eating at a restaurant, ask that your food be prepared with less salt, or no salt if possible.                      WHAT FOODS CAN I EAT?  Seek help from a dietitian for individual calorie needs. Grains Whole grain or whole wheat bread. Brown rice. Whole grain or whole wheat pasta. Quinoa, bulgur, and whole grain cereals. Low-sodium cereals. Corn or whole wheat flour tortillas. Whole grain cornbread. Whole grain crackers. Low-sodium crackers.  Vegetables Fresh or frozen vegetables (raw, steamed, roasted, or grilled). Low-sodium or reduced-sodium tomato and vegetable juices. Low-sodium or reduced-sodium tomato sauce and paste. Low-sodium or reduced-sodium canned vegetables.   Fruits All fresh, canned (in natural juice), or frozen fruits.  Meat and Other Protein Products  All fish and seafood.  Dried beans, peas, or lentils. Unsalted nuts and seeds. Unsalted canned beans. Dairy Low-fat dairy products, such as skim or 1% milk, 2% or reduced-fat cheeses, low-fat ricotta or cottage cheese, or plain low-fat yogurt. Low-sodium or reduced-sodium cheeses.  Fats and Oils Tub margarines without trans fats. Light or reduced-fat mayonnaise  and salad dressings (reduced sodium). Avocado. Safflower, olive, or canola oils. Natural peanut or almond butter.  Other Unsalted popcorn and pretzels. The items listed above [place] not be a complete list of recommended foods or beverages. Contact your dietitian for more options.  +++++++++++++++++++++++++++++++++++++++++++  WHAT FOODS ARE NOT RECOMMENDED?  Grains/ White flour or wheat flour  White bread. White pasta. White rice. Refined cornbread. Bagels and croissants. Crackers that contain trans fat.  Vegetables  Creamed or fried vegetables. Vegetables in a . Regular canned vegetables. Regular canned tomato sauce and paste. Regular tomato and vegetable juices.  Fruits Dried fruits. Canned fruit in light or heavy syrup. Fruit juice.  Meat and Other Protein Products Meat in general. Fatty cuts of meat. Ribs, chicken wings, bacon, sausage, bologna, salami, chitterlings, fatback, hot dogs, bratwurst, and packaged luncheon meats. Salted nuts and seeds. Canned beans with salt.  Dairy Whole or 2% milk, cream, half-and-half, and cream cheese. Whole-fat or sweetened yogurt. Full-fat cheeses or blue cheese. Nondairy creamers and whipped toppings. Processed cheese, cheese spreads, or cheese curds.  Condiments Onion and garlic salt, seasoned salt, table salt, and sea  salt. Canned and packaged gravies. Worcestershire sauce. Tartar sauce. Barbecue sauce. Teriyaki sauce. Soy sauce, including reduced sodium. Steak sauce. Fish sauce. Oyster sauce. Cocktail sauce. Horseradish. Ketchup and mustard. Meat flavorings and tenderizers. Bouillon cubes. Hot sauce. Tabasco sauce. Marinades. Taco seasonings. Relishes.  Fats and Oils Butter, stick margarine, lard, shortening, ghee, and bacon fat. Coconut, palm kernel, or palm oils. Regular salad dressings.  Pickles and olives. Salted popcorn and pretzels. The items listed above may not be a complete list of foods and beverages to avoid.   Preventive Care for  Adults  A healthy lifestyle and preventive care can promote health and wellness. Preventive health guidelines for men include the following key practices:  A routine yearly physical is a good way to check with your health care provider about your health and preventative screening. It is a chance to share any concerns and updates on your health and to receive a thorough exam.  Visit your dentist for a routine exam and preventative care every 6 months. Brush your teeth twice a day and floss once a day. Good oral hygiene prevents tooth decay and gum disease.  The frequency of eye exams is based on your age, health, family medical history, use of contact lenses, and other factors. Follow your health care provider's recommendations for frequency of eye exams.  Eat a healthy diet. Foods such as vegetables, fruits, whole grains, low-fat dairy products, and lean protein foods contain the nutrients you need without too many calories. Decrease your intake of foods high in solid fats, added sugars, and salt. Eat the right amount of calories for you.Get information about a proper diet from your health care provider, if necessary.  Regular physical exercise is one of the most important things you can do for your health. Most adults should get at least 150 minutes of moderate-intensity exercise (any activity that increases your heart rate and causes you to sweat) each week. In addition, most adults need muscle-strengthening exercises on 2 or more days a week.  Maintain a healthy weight. The body mass index (BMI) is a screening tool to identify possible weight problems. It provides an estimate of body fat based on height and weight. Your health care provider can find your BMI and can help you achieve or maintain a healthy weight.For adults 20 years and older:  A BMI below 18.5 is considered underweight.  A BMI of 18.5 to 24.9 is normal.  A BMI of 25 to 29.9 is considered overweight.  A BMI of 30 and above  is considered obese.  Maintain normal blood lipids and cholesterol levels by exercising and minimizing your intake of saturated fat. Eat a balanced diet with plenty of fruit and vegetables. Blood tests for lipids and cholesterol should begin at age 20 and be repeated every 5 years. If your lipid or cholesterol levels are high, you are over 50, or you are at high risk for heart disease, you may need your cholesterol levels checked more frequently.Ongoing high lipid and cholesterol levels should be treated with medicines if diet and exercise are not working.  If you smoke, find out from your health care provider how to quit. If you do not use tobacco, do not start.  Lung cancer screening is recommended for adults aged 55-80 years who are at high risk for developing lung cancer because of a history of smoking. A yearly low-dose CT scan of the lungs is recommended for people who have at least a 30-pack-year history of smoking and are a   current smoker or have quit within the past 15 years. A pack year of smoking is smoking an average of 1 pack of cigarettes a day for 1 year (for example: 1 pack a day for 30 years or 2 packs a day for 15 years). Yearly screening should continue until the smoker has stopped smoking for at least 15 years. Yearly screening should be stopped for people who develop a health problem that would prevent them from having lung cancer treatment.  If you choose to drink alcohol, do not have more than 2 drinks per day. One drink is considered to be 12 ounces (355 mL) of beer, 5 ounces (148 mL) of wine, or 1.5 ounces (44 mL) of liquor.  Avoid use of street drugs. Do not share needles with anyone. Ask for help if you need support or instructions about stopping the use of drugs.  High blood pressure causes heart disease and increases the risk of stroke. Your blood pressure should be checked at least every 1-2 years. Ongoing high blood pressure should be treated with medicines, if weight loss  and exercise are not effective.  If you are 45-79 years old, ask your health care provider if you should take aspirin to prevent heart disease.  Diabetes screening involves taking a blood sample to check your fasting blood sugar level. Testing should be considered at a younger age or be carried out more frequently if you are overweight and have at least 1 risk factor for diabetes.  Colorectal cancer can be detected and often prevented. Most routine colorectal cancer screening begins at the age of 50 and continues through age 75. However, your health care provider may recommend screening at an earlier age if you have risk factors for colon cancer. On a yearly basis, your health care provider may provide home test kits to check for hidden blood in the stool. Use of a small camera at the end of a tube to directly examine the colon (sigmoidoscopy or colonoscopy) can detect the earliest forms of colorectal cancer. Talk to your health care provider about this at age 50, when routine screening begins. Direct exam of the colon should be repeated every 5-10 years through age 75, unless early forms of precancerous polyps or small growths are found.  Hepatitis C blood testing is recommended for all people born from 1945 through 1965 and any individual with known risks for hepatitis C.  Screening for abdominal aortic aneurysm (AAA)  by ultrasound is recommended for people who have history of high blood pressure or who are current or former smokers.  Healthy men should  receive prostate-specific antigen (PSA) blood tests as part of routine cancer screening. Talk with your health care provider about prostate cancer screening.  Testicular cancer screening is  recommended for adult males. Screening includes self-exam, a health care provider exam, and other screening tests. Consult with your health care provider about any symptoms you have or any concerns you have about testicular cancer.  Use sunscreen. Apply  sunscreen liberally and repeatedly throughout the day. You should seek shade when your shadow is shorter than you. Protect yourself by wearing long sleeves, pants, a wide-brimmed hat, and sunglasses year round, whenever you are outdoors.  Once a month, do a whole-body skin exam, using a mirror to look at the skin on your back. Tell your health care provider about new moles, moles that have irregular borders, moles that are larger than a pencil eraser, or moles that have changed in shape or color.    Stay current with required vaccines (immunizations).  Influenza vaccine. All adults should be immunized every year.  Tetanus, diphtheria, and acellular pertussis (Td, Tdap) vaccine. An adult who has not previously received Tdap or who does not know his vaccine status should receive 1 dose of Tdap. This initial dose should be followed by tetanus and diphtheria toxoids (Td) booster doses every 10 years. Adults with an unknown or incomplete history of completing a 3-dose immunization series with Td-containing vaccines should begin or complete a primary immunization series including a Tdap dose. Adults should receive a Td booster every 10 years.  Zoster vaccine. One dose is recommended for adults aged 60 years or older unless certain conditions are present.    PREVNAR - Pneumococcal 13-valent conjugate (PCV13) vaccine. When indicated, a person who is uncertain of his immunization history and has no record of immunization should receive the PCV13 vaccine. An adult aged 19 years or older who has certain medical conditions and has not been previously immunized should receive 1 dose of PCV13 vaccine. This PCV13 should be followed with a dose of pneumococcal polysaccharide (PPSV23) vaccine. The PPSV23 vaccine dose should be obtained at least 8 weeks after the dose of PCV13 vaccine. An adult aged 19 years or older who has certain medical conditions and previously received 1 or more doses of PPSV23 vaccine should  receive 1 dose of PCV13. The PCV13 vaccine dose should be obtained 1 or more years after the last PPSV23 vaccine dose.    PNEUMOVAX - Pneumococcal polysaccharide (PPSV23) vaccine. When PCV13 is also indicated, PCV13 should be obtained first. All adults aged 65 years and older should be immunized. An adult younger than age 65 years who has certain medical conditions should be immunized. Any person who resides in a nursing home or long-term care facility should be immunized. An adult smoker should be immunized. People with an immunocompromised condition and certain other conditions should receive both PCV13 and PPSV23 vaccines. People with human immunodeficiency virus (HIV) infection should be immunized as soon as possible after diagnosis. Immunization during chemotherapy or radiation therapy should be avoided. Routine use of PPSV23 vaccine is not recommended for American Indians, [place] Natives, or people younger than 65 years unless there are medical conditions that require PPSV23 vaccine. When indicated, people who have unknown immunization and have no record of immunization should receive PPSV23 vaccine. One-time revaccination 5 years after the first dose of PPSV23 is recommended for people aged 19-64 years who have chronic kidney failure, nephrotic syndrome, asplenia, or immunocompromised conditions. People who received 1-2 doses of PPSV23 before age 65 years should receive another dose of PPSV23 vaccine at age 65 years or later if at least 5 years have passed since the previous dose. Doses of PPSV23 are not needed for people immunized with PPSV23 at or after age 65 years.    Hepatitis A vaccine. Adults who wish to be protected from this disease, have certain high-risk conditions, work with hepatitis A-infected animals, work in hepatitis A research labs, or travel to or work in countries with a high rate of hepatitis A should be immunized. Adults who were previously unvaccinated and who anticipate close  contact with an international adoptee during the first 60 days after arrival in the [place] from a country with a high rate of hepatitis A should be immunized.    Hepatitis B vaccine. Adults should be immunized if they wish to be protected from this disease, have certain high-risk conditions, may be exposed to blood or other   infectious body fluids, are household contacts or sex partners of hepatitis B positive people, are clients or workers in certain care facilities, or travel to or work in countries with a high rate of hepatitis B.   Preventive Service / Frequency   Ages 37 and over  Blood pressure check.  Lipid and cholesterol check.  Lung cancer screening. / Every year if you are aged 63-80 years and have a 30-pack-year history of smoking and currently smoke or have quit within the past 15 years. Yearly screening is stopped once you have quit smoking for at least 15 years or develop a health problem that would prevent you from having lung cancer treatment.  Fecal occult blood test (FOBT) of stool. You may not have to do this test if you get a colonoscopy every 10 years.  Flexible sigmoidoscopy** or colonoscopy.** / Every 5 years for a flexible sigmoidoscopy or every 10 years for a colonoscopy beginning at age 75 and continuing until age 50.  Hepatitis C blood test.** / For all people born from [birth year] through 1963 and any individual with known risks for hepatitis C.  Abdominal aortic aneurysm (AAA) screening./ Screening current or former smokers or have Hypertension.  Skin self-exam. / Monthly.  Influenza vaccine. / Every year.  Tetanus, diphtheria, and acellular pertussis (Tdap/Td) vaccine.** / 1 dose of Td every 10 years.   Zoster vaccine.** / 1 dose for adults aged 31 years or older.         Pneumococcal 13-valent conjugate (PCV13) vaccine.    Pneumococcal polysaccharide (PPSV23) vaccine.     Hepatitis A vaccine.** / Consult your health care provider.  Hepatitis  B vaccine.** / Consult your health care provider. Screening for abdominal aortic aneurysm (AAA)  by ultrasound is recommended for people who have history of high blood pressure or who are current or former smokers.Back Pain, Adult Back pain is very common in adults.The cause of back pain is rarely dangerous and the pain often gets better over time.The cause of your back pain may not be known. Some common causes of back pain include:  Strain of the muscles or ligaments supporting the spine.  Wear and tear (degeneration) of the spinal disks.  Arthritis.  Direct injury to the back. For many people, back pain may return. Since back pain is rarely dangerous, most people can learn to manage this condition on their own. HOME CARE INSTRUCTIONS Watch your back pain for any changes. The following actions may help to lessen any discomfort you are feeling:  Remain active. It is stressful on your back to sit or stand in one place for long periods of time. Do not sit, drive, or stand in one place for more than 30 minutes at a time. Take short walks on even surfaces as soon as you are able.Try to increase the length of time you walk each day.  Exercise regularly as directed by your health care provider. Exercise helps your back heal faster. It also helps avoid future injury by keeping your muscles strong and flexible.  Do not stay in bed.Resting more than 1-2 days can delay your recovery.  Pay attention to your body when you bend and lift. The most comfortable positions are those that put less stress on your recovering back. Always use proper lifting techniques, including:  Bending your knees.  Keeping the load close to your body.  Avoiding twisting.  Find a comfortable position to sleep. Use a firm mattress and lie on your side with your knees slightly  bent. If you lie on your back, put a pillow under your knees.  Avoid feeling anxious or stressed.Stress increases muscle tension and can worsen  back pain.It is important to recognize when you are anxious or stressed and learn ways to manage it, such as with exercise.  Take medicines only as directed by your health care provider. Over-the-counter medicines to reduce pain and inflammation are often the most helpful.Your health care provider may prescribe muscle relaxant drugs.These medicines help dull your pain so you can more quickly return to your normal activities and healthy exercise.  Apply ice to the injured area:  Put ice in a plastic bag.  Place a towel between your skin and the bag.  Leave the ice on for 20 minutes, 2-3 times a day for the first 2-3 days. After that, ice and heat may be alternated to reduce pain and spasms.  Maintain a healthy weight. Excess weight puts extra stress on your back and makes it difficult to maintain good posture. SEEK MEDICAL CARE IF:  You have pain that is not relieved with rest or medicine.  You have increasing pain going down into the legs or buttocks.  You have pain that does not improve in one week.  You have night pain.  You lose weight.  You have a fever or chills. SEEK IMMEDIATE MEDICAL CARE IF:   You develop new bowel or bladder control problems.  You have unusual weakness or numbness in your arms or legs.  You develop nausea or vomiting.  You develop abdominal pain.  You feel faint.   This information is not intended to replace advice given to you by your health care provider. Make sure you discuss any questions you have with your health care provider.   Document Released: 09/22/2005 Document Revised: 10/13/2014 Document Reviewed: 01/24/2014 [employer] Interactive Patient Education [employer].

## 2015-08-11 LAB — PSA: PSA: 0.67 ng/mL (ref <=4.00)

## 2015-08-11 LAB — MICROALBUMIN / CREATININE URINE RATIO
CREATININE, URINE: 67 mg/dL (ref 20–370)
MICROALB UR: 0.2 mg/dL
MICROALB/CREAT RATIO: 3 ug/mg{creat} (ref <30)

## 2015-08-11 LAB — HEMOGLOBIN A1C
Hgb A1c MFr Bld: 5.9 % — ABNORMAL HIGH (ref <5.7)
Mean Plasma Glucose: 123 mg/dL — ABNORMAL HIGH (ref <117)

## 2015-08-11 LAB — VITAMIN D 25 HYDROXY (VIT D DEFICIENCY, FRACTURES): Vit D, 25-Hydroxy: 59 ng/mL (ref 30–100)

## 2015-08-11 LAB — INSULIN, RANDOM: Insulin: 11.3 u[IU]/mL (ref 2.0–19.6)

## 2015-11-06 DIAGNOSIS — L570 Actinic keratosis: Secondary | ICD-10-CM | POA: Diagnosis not present

## 2015-11-06 DIAGNOSIS — C44612 Basal cell carcinoma of skin of right upper limb, including shoulder: Secondary | ICD-10-CM | POA: Diagnosis not present

## 2015-11-06 DIAGNOSIS — C44622 Squamous cell carcinoma of skin of right upper limb, including shoulder: Secondary | ICD-10-CM | POA: Diagnosis not present

## 2015-11-16 ENCOUNTER — Encounter: Payer: Self-pay | Admitting: Internal Medicine

## 2015-11-16 ENCOUNTER — Ambulatory Visit (INDEPENDENT_AMBULATORY_CARE_PROVIDER_SITE_OTHER): Payer: Medicare Other | Admitting: Internal Medicine

## 2015-11-16 VITALS — BP 132/64 | HR 58 | Temp 98.4°F | Resp 18 | Ht 73.0 in | Wt 224.0 lb

## 2015-11-16 DIAGNOSIS — M722 Plantar fascial fibromatosis: Secondary | ICD-10-CM | POA: Diagnosis not present

## 2015-11-16 DIAGNOSIS — E785 Hyperlipidemia, unspecified: Secondary | ICD-10-CM | POA: Diagnosis not present

## 2015-11-16 DIAGNOSIS — E559 Vitamin D deficiency, unspecified: Secondary | ICD-10-CM | POA: Diagnosis not present

## 2015-11-16 DIAGNOSIS — I10 Essential (primary) hypertension: Secondary | ICD-10-CM | POA: Diagnosis not present

## 2015-11-16 DIAGNOSIS — Z79899 Other long term (current) drug therapy: Secondary | ICD-10-CM | POA: Diagnosis not present

## 2015-11-16 DIAGNOSIS — R7303 Prediabetes: Secondary | ICD-10-CM | POA: Diagnosis not present

## 2015-11-16 MED ORDER — PREDNISONE 20 MG PO TABS: ORAL_TABLET | ORAL | Status: DC

## 2015-11-16 NOTE — Patient Instructions (Signed)
Plantar Fasciitis Plantar fasciitis is a painful foot condition that affects the heel. It occurs when the band of tissue that connects the toes to the heel bone (plantar fascia) becomes irritated. This can happen after exercising too much or doing other repetitive activities (overuse injury). The pain from plantar fasciitis can range from mild irritation to severe pain that makes it difficult for you to walk or move. The pain is usually worse in the morning or after you have been sitting or lying down for a while. CAUSES This condition may be caused by:  Standing for long periods of time.  Wearing shoes that do not fit.  Doing high-impact activities, including running, aerobics, and ballet.  Being overweight.  Having an abnormal way of walking (gait).  Having tight calf muscles.  Having high arches in your feet.  Starting a new athletic activity. SYMPTOMS The main symptom of this condition is heel pain. Other symptoms include:  Pain that gets worse after activity or exercise.  Pain that is worse in the morning or after resting.  Pain that goes away after you walk for a few minutes. DIAGNOSIS This condition may be diagnosed based on your signs and symptoms. Your health care provider will also do a physical exam to check for:  A tender area on the bottom of your foot.  A high arch in your foot.  Pain when you move your foot.  Difficulty moving your foot. You may also need to have imaging studies to confirm the diagnosis. These can include:  X-rays.  Ultrasound.  MRI. TREATMENT  Treatment for plantar fasciitis depends on the severity of the condition. Your treatment may include:  Rest, ice, and over-the-counter pain medicines to manage your pain.  Exercises to stretch your calves and your plantar fascia.  A splint that holds your foot in a stretched, upward position while you sleep (night splint).  Physical therapy to relieve symptoms and prevent problems in the  future.  Cortisone injections to relieve severe pain.  Extracorporeal shock wave therapy (ESWT) to stimulate damaged plantar fascia with electrical impulses. It is often used as a last resort before surgery.  Surgery, if other treatments have not worked after 12 months. HOME CARE INSTRUCTIONS  Take medicines only as directed by your health care provider.  Avoid activities that cause pain.  Roll the bottom of your foot over a bag of ice or a bottle of cold water. Do this for 20 minutes, 3-4 times a day.  Perform simple stretches as directed by your health care provider.  Try wearing athletic shoes with air-sole or gel-sole cushions or soft shoe inserts.  Wear a night splint while sleeping, if directed by your health care provider.  Keep all follow-up appointments with your health care provider. PREVENTION   Do not perform exercises or activities that cause heel pain.  Consider finding low-impact activities if you continue to have problems.  Lose weight if you need to. The best way to prevent plantar fasciitis is to avoid the activities that aggravate your plantar fascia. SEEK MEDICAL CARE IF:  Your symptoms do not go away after treatment with home care measures.  Your pain gets worse.  Your pain affects your ability to move or do your daily activities.   This information is not intended to replace advice given to you by your health care provider. Make sure you discuss any questions you have with your health care provider.   Document Released: 06/17/2001 Document Revised: 06/13/2015 Document Reviewed: 08/02/2014 Elsevier   Interactive Patient Education 2016 [employer]  

## 2015-11-16 NOTE — Progress Notes (Signed)
Patient ID: Cody Hines, male   DOB: December 12, 1945, 70 y.o.   MRN: [id number]  Assessment and Plan:  Hypertension:  -Continue medication,  -monitor blood pressure at home.  -Continue DASH diet.   -Reminder to go to the ER if any CP, SOB, nausea, dizziness, severe HA, changes vision/speech, left arm numbness and tingling, and jaw pain.  Cholesterol: -Continue diet and exercise.  -Check cholesterol.   Pre-diabetes: -Continue diet and exercise.  -Check A1C  Vitamin D Def: -check level -continue medications.   Plantar fasciitis -arch wrap -avoid walking barefoot -prednisone taper -voltaren gel  Continue diet and meds as discussed. Further disposition pending results of labs.  HPI 70 y.o. male  presents for 3 month follow up with hypertension, hyperlipidemia, prediabetes and vitamin D.   His blood pressure has been controlled at home, today their BP is BP: 132/64 mmHg.   He does not workout. He denies chest pain, shortness of breath, dizziness.   He is on cholesterol medication and denies myalgias. His cholesterol is at goal. The cholesterol last visit was:   Lab Results  Component Value Date   CHOL 166 08/10/2015   HDL 49 08/10/2015   LDLCALC 64 08/10/2015   TRIG 264* 08/10/2015   CHOLHDL 3.4 08/10/2015     He has been working on diet and exercise for prediabetes, and denies foot ulcerations, hyperglycemia, hypoglycemia , increased appetite, nausea, paresthesia of the feet, polydipsia, polyuria, visual disturbances, vomiting and weight loss. Last A1C in the office was:  Lab Results  Component Value Date   HGBA1C 5.9* 08/10/2015    Patient is on Vitamin D supplement.  Lab Results  Component Value Date   VD25OH 88 08/10/2015      He reports that he went to the dermatologist and had several different places removed and also had some cryotherapy.   Patient also reports that 2 weeks ago he developed severe foot pain which is at the heel and at the arch of the foot.   He has been using voltaren on it.  It is the left foot.   Current Medications:  Current Outpatient Prescriptions on File Prior to Visit  Medication Sig Dispense Refill  . ALPRAZolam (XANAX) 0.5 MG tablet Take 1 tablet (0.5 mg total) by mouth 3 (three) times daily as needed for anxiety or sleep. 90 tablet 1  . aspirin EC 81 MG tablet Take 81 mg by mouth daily.    Marland Kitchen buPROPion (WELLBUTRIN XL) 300 MG 24 hr tablet Take 1 tablet by mouth  daily 90 tablet 1  . enalapril (VASOTEC) 20 MG tablet Take 1 tablet by mouth  every day for blood  pressure 90 tablet 1  . Flaxseed, Linseed, (FLAX SEED OIL) 1000 MG CAPS Take 1 capsule by mouth 2 (two) times daily.     . rosuvastatin (CRESTOR) 40 MG tablet Take 1 tablet (40 mg total) by mouth daily. 90 tablet 1  . Vitamin D, Ergocalciferol, (DRISDOL) 50000 UNITS CAPS capsule Take 50,000 Units by mouth daily.      No current facility-administered medications on file prior to visit.    Medical History:  Past Medical History  Diagnosis Date  . Hypertension   . Hyperlipidemia   . GERD (gastroesophageal reflux disease)   . Elevated hemoglobin A1c   . IBS (irritable bowel syndrome)   . OSA (obstructive sleep apnea)   . Fibromyalgia   . BPH (benign prostatic hyperplasia)   . Hypogonadism male     Allergies:  Allergies  Allergen Reactions  . Asa [Aspirin]   . Atorvastatin     Muscle aches  . Celebrex [Celecoxib]   . Codeine   . Penicillins   . Savella [Milnacipran Hcl]   . Viagra [Sildenafil Citrate] Other (See Comments)    Headache     Review of Systems:  Review of Systems  Constitutional: Negative for fever, chills and malaise/fatigue.  HENT: Negative for congestion, ear pain and sore throat.   Eyes: Negative.   Respiratory: Negative for cough, shortness of breath and wheezing.   Cardiovascular: Negative for chest pain, palpitations and leg swelling.  Gastrointestinal: Negative for heartburn, abdominal pain, diarrhea, constipation, blood in  stool and melena.  Genitourinary: Negative.   Neurological: Negative for dizziness, sensory change, loss of consciousness and headaches.  Psychiatric/Behavioral: Negative for depression. The patient is not nervous/anxious and does not have insomnia.     Family history- Review and unchanged  Social history- Review and unchanged  Physical Exam: BP 132/64 mmHg  Pulse 58  Temp(Src) 98.4 F (36.9 C) (Temporal)  Resp 18  Ht 6\' 1"  (1.854 m)  Wt 224 lb (101.606 kg)  BMI 29.56 kg/m2 Wt Readings from Last 3 Encounters:  11/16/15 224 lb (101.606 kg)  08/10/15 221 lb 9.6 oz (100.517 kg)  03/27/15 221 lb (100.245 kg)    General Appearance: Well nourished well developed, in no apparent distress. Eyes: PERRLA, EOMs, conjunctiva no swelling or erythema ENT/Mouth: Ear canals normal without obstruction, swelling, erythma, discharge.  TMs normal bilaterally.  Oropharynx moist, clear, without exudate, or postoropharyngeal swelling. Neck: Supple, thyroid normal,no cervical adenopathy  Respiratory: Respiratory effort normal, Breath sounds clear A&P without rhonchi, wheeze, or rale.  No retractions, no accessory usage. Cardio: RRR with no MRGs. Brisk peripheral pulses without edema.  Abdomen: Soft, + BS,  Non tender, no guarding, rebound, hernias, masses. Musculoskeletal: Full ROM, 5/5 strength, Normal gait, tenderness to palpation of the left heel at the plantar fascia insertion.   Skin: Warm, dry without rashes, lesions, ecchymosis.  Neuro: Awake and oriented X 3, Cranial nerves intact. Normal muscle tone, no cerebellar symptoms. Psych: Normal affect, Insight and Judgment appropriate.    Starlyn Skeans, PA-C 9:06 AM [facility]

## 2015-11-22 ENCOUNTER — Other Ambulatory Visit: Payer: Self-pay | Admitting: Internal Medicine

## 2015-12-10 ENCOUNTER — Encounter: Payer: Self-pay | Admitting: Internal Medicine

## 2015-12-10 ENCOUNTER — Ambulatory Visit (INDEPENDENT_AMBULATORY_CARE_PROVIDER_SITE_OTHER): Payer: Medicare Other | Admitting: Internal Medicine

## 2015-12-10 VITALS — BP 126/62 | HR 84 | Temp 98.2°F | Resp 18 | Ht 73.0 in | Wt 220.0 lb

## 2015-12-10 DIAGNOSIS — J069 Acute upper respiratory infection, unspecified: Secondary | ICD-10-CM

## 2015-12-10 MED ORDER — PREDNISONE 20 MG PO TABS: ORAL_TABLET | ORAL | Status: DC

## 2015-12-10 MED ORDER — AZITHROMYCIN 250 MG PO TABS: ORAL_TABLET | ORAL | Status: DC

## 2015-12-10 MED ORDER — PROMETHAZINE-DM 6.25-15 MG/5ML PO SYRP: ORAL_SOLUTION | ORAL | Status: DC

## 2015-12-10 MED ORDER — MOMETASONE FUROATE 50 MCG/ACT NA SUSP: 2.0000 | Freq: Every day | NASAL | Status: DC

## 2015-12-10 NOTE — Progress Notes (Signed)
  HPI  Patient presents to the office for evaluation of cough.  It has been going on for 1 weeks.  Patient reports dry, occasional cough.  They also endorse change in voice, postnasal drip and nasal congestion, rhinorrhea, nose bleeds..  They have tried mucinex and tylenol.  They report that nothing has worked.  They denies other sick contacts.  Review of Systems  Constitutional: Negative for fever, chills and malaise/fatigue.  HENT: Positive for congestion. Negative for ear pain and sore throat.   Eyes: Negative.   Respiratory: Negative for cough, shortness of breath and wheezing.   Cardiovascular: Negative for chest pain, palpitations and leg swelling.  Skin: Negative.   Neurological: Positive for headaches.    PE:  Filed Vitals:   12/10/15 1432  BP: 126/62  Pulse: 84  Temp: 98.2 F (36.8 C)  Resp: 18     General:  Alert and non-toxic, WDWN, NAD HEENT: NCAT, PERLA, EOM normal, no occular discharge or erythema.  Nasal mucosal edema with sinus tenderness to palpation.  Oropharynx clear with minimal oropharyngeal edema and erythema.  Mucous membranes moist and pink. Neck:  Cervical adenopathy Chest:  RRR no MRGs.  Lungs clear to auscultation A&P with no wheezes rhonchi or rales.   Abdomen: +BS x 4 quadrants, soft, non-tender, no guarding, rigidity, or rebound. Skin: warm and dry no rash Neuro: A&Ox4, CN II-XII grossly intact  Assessment and Plan:   1. Acute URI -phenergan dm -zpak -prednisone -antihistamine -nasonex -vaseline in the nares

## 2015-12-18 DIAGNOSIS — Z85828 Personal history of other malignant neoplasm of skin: Secondary | ICD-10-CM | POA: Diagnosis not present

## 2016-01-30 ENCOUNTER — Other Ambulatory Visit: Payer: Self-pay | Admitting: Internal Medicine

## 2016-02-15 ENCOUNTER — Ambulatory Visit: Payer: Self-pay | Admitting: Internal Medicine

## 2016-02-18 ENCOUNTER — Encounter: Payer: Self-pay | Admitting: Internal Medicine

## 2016-02-18 ENCOUNTER — Ambulatory Visit (INDEPENDENT_AMBULATORY_CARE_PROVIDER_SITE_OTHER): Payer: Medicare Other | Admitting: Internal Medicine

## 2016-02-18 VITALS — BP 132/70 | HR 56 | Temp 97.7°F | Resp 16 | Ht 73.0 in | Wt 225.4 lb

## 2016-02-18 DIAGNOSIS — K219 Gastro-esophageal reflux disease without esophagitis: Secondary | ICD-10-CM | POA: Diagnosis not present

## 2016-02-18 DIAGNOSIS — E559 Vitamin D deficiency, unspecified: Secondary | ICD-10-CM | POA: Diagnosis not present

## 2016-02-18 DIAGNOSIS — Z79899 Other long term (current) drug therapy: Secondary | ICD-10-CM

## 2016-02-18 DIAGNOSIS — G4733 Obstructive sleep apnea (adult) (pediatric): Secondary | ICD-10-CM | POA: Diagnosis not present

## 2016-02-18 DIAGNOSIS — E785 Hyperlipidemia, unspecified: Secondary | ICD-10-CM | POA: Diagnosis not present

## 2016-02-18 DIAGNOSIS — I10 Essential (primary) hypertension: Secondary | ICD-10-CM

## 2016-02-18 DIAGNOSIS — R7309 Other abnormal glucose: Secondary | ICD-10-CM | POA: Diagnosis not present

## 2016-02-18 DIAGNOSIS — R7303 Prediabetes: Secondary | ICD-10-CM | POA: Diagnosis not present

## 2016-02-18 DIAGNOSIS — Z9989 Dependence on other enabling machines and devices: Secondary | ICD-10-CM

## 2016-02-18 LAB — CBC WITH DIFFERENTIAL/PLATELET
BASOS ABS: 0 {cells}/uL (ref 0–200)
BASOS PCT: 0 %
EOS ABS: 264 {cells}/uL (ref 15–500)
Eosinophils Relative: 4 %
HCT: 41.9 % (ref 38.5–50.0)
HEMOGLOBIN: 14.2 g/dL (ref 13.2–17.1)
LYMPHS ABS: 2706 {cells}/uL (ref 850–3900)
Lymphocytes Relative: 41 %
MCH: 30 pg (ref 27.0–33.0)
MCHC: 33.9 g/dL (ref 32.0–36.0)
MCV: 88.6 fL (ref 80.0–100.0)
MPV: 11.1 fL (ref 7.5–12.5)
Monocytes Absolute: 726 cells/uL (ref 200–950)
Monocytes Relative: 11 %
NEUTROS ABS: 2904 {cells}/uL (ref 1500–7800)
Neutrophils Relative %: 44 %
Platelets: 195 10*3/uL (ref 140–400)
RBC: 4.73 MIL/uL (ref 4.20–5.80)
RDW: 13.9 % (ref 11.0–15.0)
WBC: 6.6 10*3/uL (ref 3.8–10.8)

## 2016-02-18 LAB — BASIC METABOLIC PANEL WITH GFR
BUN: 18 mg/dL (ref 7–25)
CALCIUM: 9.2 mg/dL (ref 8.6–10.3)
CO2: 25 mmol/L (ref 20–31)
Chloride: 101 mmol/L (ref 98–110)
Creat: 1.48 mg/dL — ABNORMAL HIGH (ref 0.70–1.25)
GFR, EST AFRICAN AMERICAN: 56 mL/min — AB (ref >=60)
GFR, EST NON AFRICAN AMERICAN: 48 mL/min — AB (ref >=60)
Glucose, Bld: 99 mg/dL (ref 65–99)
Potassium: 4.5 mmol/L (ref 3.5–5.3)
SODIUM: 138 mmol/L (ref 135–146)

## 2016-02-18 LAB — HEPATIC FUNCTION PANEL
ALT: 26 U/L (ref 9–46)
AST: 30 U/L (ref 10–35)
Albumin: 4.4 g/dL (ref 3.6–5.1)
Alkaline Phosphatase: 35 U/L — ABNORMAL LOW (ref 40–115)
BILIRUBIN DIRECT: 0.1 mg/dL (ref <=0.2)
BILIRUBIN INDIRECT: 0.3 mg/dL (ref 0.2–1.2)
Total Bilirubin: 0.4 mg/dL (ref 0.2–1.2)
Total Protein: 6.6 g/dL (ref 6.1–8.1)

## 2016-02-18 LAB — TSH: TSH: 2.72 mIU/L (ref 0.40–4.50)

## 2016-02-18 LAB — LIPID PANEL
CHOL/HDL RATIO: 3.3 ratio (ref <=5.0)
CHOLESTEROL: 160 mg/dL (ref 125–200)
HDL: 49 mg/dL (ref >=40)
LDL Cholesterol: 61 mg/dL (ref <130)
TRIGLYCERIDES: 251 mg/dL — AB (ref <150)
VLDL: 50 mg/dL — AB (ref <30)

## 2016-02-18 LAB — MAGNESIUM: Magnesium: 2.2 mg/dL (ref 1.5–2.5)

## 2016-02-18 NOTE — Progress Notes (Signed)
Patient ID: JL MANDLER, male   DOB: 13-May-1946, 70 y.o.   MRN: [id number]  [facility]                       [name], M.D.        Uvaldo Bristle. Silverio Lay, P.A.-C       Starlyn Skeans, P.A.-C   [facility]                [address] [place], [place] [id number] Telephone [phone] Telefax [phone] _________________________________________________________________________   This very nice 70 y.o. MWM presents for 6 month follow up with Hypertension, Hyperlipidemia, Pre-Diabetes and Vitamin D Deficiency.    Patient is treated for HTN circa 1993 & BP has been controlled at home. Today's BP: 132/70 mmHg. Patient has hx/o False (+) Cardiolite Stress Myoview with a negative Heart Cath in 2005. Patient has had no complaints of any cardiac type chest pain, palpitations, dyspnea/orthopnea/PND, dizziness, claudication, or dependent edema.   Hyperlipidemia is controlled with diet & meds. Patient denies myalgias or other med SE's. Last Lipids were at goal with Cholesterol 166; HDL 49; LDL 64; but elevated Triglycerides 264 on 08/10/2015.   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  Last A1c was  5.9% on 08/10/2015.   Further, the patient also has history of Vitamin D Deficiency of "29" in 2008  and supplements vitamin D without any suspected side-effects. Last vitamin D was  59 on 08/10/2015.     Medication Sig  . ALPRAZolam 0.5 MG t Take 1 tablet (0.5 mg total) by mouth 3 (three) times daily as needed for anxiety or sleep.  Marland Kitchen aspirin EC 81 MG tablet Take 81 mg by mouth daily.  Marland Kitchen buPROPion XL 300 MG  Take 1 tablet by mouth  daily  . enalapril  20 MG Take 1 tablet by mouth  every day for blood  pressure  . FLAX SEED OIL 1000 MG  Take 1 capsule by mouth 2  (two) times daily.   . Magnesium 500 MG TABS Take by mouth daily.  Marland Kitchen NASONEX nasal spray Place 2 sprays into the nose daily.  . rosuvastatin  40 MG tablet Take 1 tablet (40 mg total) by mouth daily.  . Vitamin D 50,000 UNITS  Take 50,000 Units by mouth daily.    Allergies  Allergen Reactions  . Asa [Aspirin]   . Atorvastatin     Muscle aches  . Celebrex [Celecoxib]   . Codeine   . Penicillins   . Savella [Milnacipran Hcl]   . Viagra [Sildenafil Citrate] Other (See Comments)    Headache   PMHx:   Past Medical History  Diagnosis Date  . Hypertension   . Hyperlipidemia   . GERD (gastroesophageal reflux disease)   . Elevated hemoglobin A1c   . IBS (irritable bowel syndrome)   . OSA (obstructive sleep apnea)   . Fibromyalgia   . BPH (benign prostatic hyperplasia)   . Hypogonadism male    Immunization History  Administered Date(s) Administered  . Pneumococcal-Unspecified 07/16/2011  . Tdap 01/13/2010  . Zoster 08/09/2013   Past Surgical History  Procedure Laterality Date  . Spine surgery  2007  L5 S 1 Disk  . Knee arthroscopy Left 1999  .  nasal smr np3  1985  . Vasectomy  1983   FHx:    Reviewed / unchanged  SHx:    Reviewed / unchanged  Systems Review:  Constitutional: Denies fever, chills, wt changes, headaches, insomnia, fatigue, night sweats, change in appetite. Eyes: Denies redness, blurred vision, diplopia, discharge, itchy, watery eyes.  ENT: Denies discharge, congestion, post nasal drip, epistaxis, sore throat, earache, hearing loss, dental pain, tinnitus, vertigo, sinus pain, snoring.  CV: Denies chest pain, palpitations, irregular heartbeat, syncope, dyspnea, diaphoresis, orthopnea, PND, claudication or edema. Respiratory: denies cough, dyspnea, DOE, pleurisy, hoarseness, laryngitis, wheezing.  Gastrointestinal: Denies dysphagia, odynophagia, heartburn, reflux, water brash, abdominal pain or cramps, nausea, vomiting, bloating, diarrhea, constipation,  hematemesis, melena, hematochezia  or hemorrhoids. Genitourinary: Denies dysuria, frequency, urgency, nocturia, hesitancy, discharge, hematuria or flank pain. Musculoskeletal: Denies arthralgias, myalgias, stiffness, jt. swelling, pain, limping or strain/sprain.  Skin: Denies pruritus, rash, hives, warts, acne, eczema or change in skin lesion(s). Neuro: No weakness, tremor, incoordination, spasms, paresthesia or pain. Psychiatric: Denies confusion, memory loss or sensory loss. Endo: Denies change in weight, skin or hair change.  Heme/Lymph: No excessive bleeding, bruising or enlarged lymph nodes.  Physical Exam  BP 132/70 mmHg  Pulse 56  Temp(Src) 97.7 F (36.5 C)  Resp 16  Ht 6\' 1"  (1.854 m)  Wt 225 lb 6.4 oz (102.241 kg)  BMI 29.74 kg/m2  Appears well nourished and in no distress. Eyes: PERRLA, EOMs, conjunctiva no swelling or erythema. Sinuses: No frontal/maxillary tenderness ENT/Mouth: EAC's clear, TM's nl w/o erythema, bulging. Nares clear w/o erythema, swelling, exudates. Oropharynx clear without erythema or exudates. Oral hygiene is good. Tongue normal, non obstructing. Hearing intact.  Neck: Supple. Thyroid nl. Car 2+/2+ without bruits, nodes or JVD. Chest: Respirations nl with BS clear & equal w/o rales, rhonchi, wheezing or stridor.  Cor: Heart sounds normal w/ regular rate and rhythm without sig. murmurs, gallops, clicks, or rubs. Peripheral pulses normal and equal  without edema.  Abdomen: Soft & bowel sounds normal. Non-tender w/o guarding, rebound, hernias, masses, or organomegaly.  Lymphatics: Unremarkable.  Musculoskeletal: Full ROM all peripheral extremities, joint stability, 5/5 strength, and normal gait.  Skin: Warm, dry without exposed rashes, lesions or ecchymosis apparent.  Neuro: Cranial nerves intact, reflexes equal bilaterally. Sensory-motor testing grossly intact. Tendon reflexes grossly intact.  Pysch: Alert & oriented x 3.  Insight and judgement nl &  appropriate. No ideations.  Assessment and Plan:  1. Essential hypertension  - TSH  2. Hyperlipidemia  - Lipid panel - TSH  3. Prediabetes  - Hemoglobin A1c - Insulin, random  4. Vitamin D deficiency  - VITAMIN D 25 Hydroxy   5. OSA on CPAP   6. Gastroesophageal reflux disease   7. Medication management  - CBC with Differential/Platelet - BASIC METABOLIC PANEL WITH GFR - Hepatic function panel - Magnesium   Recommended regular exercise, BP monitoring, weight control, and discussed med and SE's. Recommended labs to assess and monitor clinical status. Further disposition pending results of labs. Over 30 minutes of exam, counseling, chart review was performed

## 2016-02-18 NOTE — Patient Instructions (Signed)

## 2016-02-19 LAB — HEMOGLOBIN A1C
Hgb A1c MFr Bld: 5.9 % — ABNORMAL HIGH (ref <5.7)
MEAN PLASMA GLUCOSE: 123 mg/dL

## 2016-02-19 LAB — VITAMIN D 25 HYDROXY (VIT D DEFICIENCY, FRACTURES): Vit D, 25-Hydroxy: 48 ng/mL (ref 30–100)

## 2016-02-19 LAB — INSULIN, RANDOM: INSULIN: 11 u[IU]/mL (ref 2.0–19.6)

## 2016-02-21 ENCOUNTER — Other Ambulatory Visit: Payer: Self-pay | Admitting: Internal Medicine

## 2016-03-18 DIAGNOSIS — L570 Actinic keratosis: Secondary | ICD-10-CM | POA: Diagnosis not present

## 2016-03-18 DIAGNOSIS — Z85828 Personal history of other malignant neoplasm of skin: Secondary | ICD-10-CM | POA: Diagnosis not present

## 2016-04-14 ENCOUNTER — Other Ambulatory Visit: Payer: Self-pay | Admitting: Internal Medicine

## 2016-04-14 NOTE — Telephone Encounter (Signed)
RX SENT TO PHARMACY

## 2016-05-21 ENCOUNTER — Ambulatory Visit: Payer: Self-pay | Admitting: Physician Assistant

## 2016-05-23 ENCOUNTER — Other Ambulatory Visit: Payer: Self-pay | Admitting: Internal Medicine

## 2016-06-06 ENCOUNTER — Encounter: Payer: Self-pay | Admitting: Physician Assistant

## 2016-06-06 ENCOUNTER — Ambulatory Visit (INDEPENDENT_AMBULATORY_CARE_PROVIDER_SITE_OTHER): Payer: Medicare Other | Admitting: Physician Assistant

## 2016-06-06 VITALS — BP 120/60 | HR 61 | Temp 97.7°F | Resp 14 | Ht 73.0 in | Wt 224.8 lb

## 2016-06-06 DIAGNOSIS — Z0001 Encounter for general adult medical examination with abnormal findings: Secondary | ICD-10-CM | POA: Diagnosis not present

## 2016-06-06 DIAGNOSIS — E785 Hyperlipidemia, unspecified: Secondary | ICD-10-CM

## 2016-06-06 DIAGNOSIS — N400 Enlarged prostate without lower urinary tract symptoms: Secondary | ICD-10-CM

## 2016-06-06 DIAGNOSIS — Z9989 Dependence on other enabling machines and devices: Secondary | ICD-10-CM

## 2016-06-06 DIAGNOSIS — G4733 Obstructive sleep apnea (adult) (pediatric): Secondary | ICD-10-CM

## 2016-06-06 DIAGNOSIS — Z79899 Other long term (current) drug therapy: Secondary | ICD-10-CM | POA: Diagnosis not present

## 2016-06-06 DIAGNOSIS — E559 Vitamin D deficiency, unspecified: Secondary | ICD-10-CM

## 2016-06-06 DIAGNOSIS — R7303 Prediabetes: Secondary | ICD-10-CM

## 2016-06-06 DIAGNOSIS — R6889 Other general symptoms and signs: Secondary | ICD-10-CM

## 2016-06-06 DIAGNOSIS — M797 Fibromyalgia: Secondary | ICD-10-CM

## 2016-06-06 DIAGNOSIS — F325 Major depressive disorder, single episode, in full remission: Secondary | ICD-10-CM

## 2016-06-06 DIAGNOSIS — E291 Testicular hypofunction: Secondary | ICD-10-CM | POA: Diagnosis not present

## 2016-06-06 DIAGNOSIS — E349 Endocrine disorder, unspecified: Secondary | ICD-10-CM

## 2016-06-06 DIAGNOSIS — M722 Plantar fascial fibromatosis: Secondary | ICD-10-CM | POA: Diagnosis not present

## 2016-06-06 DIAGNOSIS — K589 Irritable bowel syndrome without diarrhea: Secondary | ICD-10-CM | POA: Diagnosis not present

## 2016-06-06 DIAGNOSIS — I10 Essential (primary) hypertension: Secondary | ICD-10-CM

## 2016-06-06 DIAGNOSIS — K219 Gastro-esophageal reflux disease without esophagitis: Secondary | ICD-10-CM | POA: Diagnosis not present

## 2016-06-06 DIAGNOSIS — Z0000 Encounter for general adult medical examination without abnormal findings: Secondary | ICD-10-CM

## 2016-06-06 LAB — LIPID PANEL
CHOLESTEROL: 160 mg/dL (ref 125–200)
HDL: 60 mg/dL (ref >=40)
LDL Cholesterol: 68 mg/dL (ref <130)
Total CHOL/HDL Ratio: 2.7 Ratio (ref <=5.0)
Triglycerides: 158 mg/dL — ABNORMAL HIGH (ref <150)
VLDL: 32 mg/dL — ABNORMAL HIGH (ref <30)

## 2016-06-06 LAB — MAGNESIUM: MAGNESIUM: 2.3 mg/dL (ref 1.5–2.5)

## 2016-06-06 LAB — CBC WITH DIFFERENTIAL/PLATELET
BASOS ABS: 60 {cells}/uL (ref 0–200)
Basophils Relative: 1 %
EOS PCT: 3 %
Eosinophils Absolute: 180 cells/uL (ref 15–500)
HEMATOCRIT: 43.1 % (ref 38.5–50.0)
HEMOGLOBIN: 14.6 g/dL (ref 13.2–17.1)
LYMPHS ABS: 2280 {cells}/uL (ref 850–3900)
Lymphocytes Relative: 38 %
MCH: 30 pg (ref 27.0–33.0)
MCHC: 33.9 g/dL (ref 32.0–36.0)
MCV: 88.7 fL (ref 80.0–100.0)
MONO ABS: 600 {cells}/uL (ref 200–950)
MPV: 11.3 fL (ref 7.5–12.5)
Monocytes Relative: 10 %
NEUTROS PCT: 48 %
Neutro Abs: 2880 cells/uL (ref 1500–7800)
Platelets: 208 10*3/uL (ref 140–400)
RBC: 4.86 MIL/uL (ref 4.20–5.80)
RDW: 13.8 % (ref 11.0–15.0)
WBC: 6 10*3/uL (ref 3.8–10.8)

## 2016-06-06 LAB — HEPATIC FUNCTION PANEL
ALBUMIN: 4.7 g/dL (ref 3.6–5.1)
ALT: 31 U/L (ref 9–46)
AST: 29 U/L (ref 10–35)
Alkaline Phosphatase: 40 U/L (ref 40–115)
Bilirubin, Direct: 0.1 mg/dL (ref <=0.2)
Indirect Bilirubin: 0.6 mg/dL (ref 0.2–1.2)
TOTAL PROTEIN: 6.9 g/dL (ref 6.1–8.1)
Total Bilirubin: 0.7 mg/dL (ref 0.2–1.2)

## 2016-06-06 LAB — TSH: TSH: 2.04 m[IU]/L (ref 0.40–4.50)

## 2016-06-06 LAB — BASIC METABOLIC PANEL WITH GFR
BUN: 17 mg/dL (ref 7–25)
CALCIUM: 9.2 mg/dL (ref 8.6–10.3)
CO2: 26 mmol/L (ref 20–31)
Chloride: 104 mmol/L (ref 98–110)
Creat: 1.76 mg/dL — ABNORMAL HIGH (ref 0.70–1.25)
GFR, Est African American: 45 mL/min — ABNORMAL LOW (ref >=60)
GFR, Est Non African American: 39 mL/min — ABNORMAL LOW (ref >=60)
GLUCOSE: 109 mg/dL — AB (ref 65–99)
Potassium: 4.3 mmol/L (ref 3.5–5.3)
Sodium: 139 mmol/L (ref 135–146)

## 2016-06-06 MED ORDER — DEXAMETHASONE SODIUM PHOSPHATE 100 MG/10ML IJ SOLN
10.0000 mg | Freq: Once | INTRAMUSCULAR | Status: AC
  Administered 2016-06-06: 10 mg via INTRAMUSCULAR

## 2016-06-06 MED ORDER — DICLOFENAC EPOLAMINE 1.3 % TD PTCH: 1.0000 | MEDICATED_PATCH | Freq: Two times a day (BID) | TRANSDERMAL | 0 refills | Status: DC

## 2016-06-06 NOTE — Progress Notes (Signed)
MEDICARE ANNUAL WELLNESS VISIT AND FOLLOW UP Assessment:    Essential hypertension - continue medications, DASH diet, exercise and monitor at home. Call if greater than 130/80.  -     CBC with Differential/Platelet -     BASIC METABOLIC PANEL WITH GFR -     Hepatic function panel -     TSH  Hyperlipidemia -continue medications, check lipids, decrease fatty foods, increase activity.  -     Lipid panel  Prediabetes Discussed general issues about diabetes pathophysiology and management., Educational material distributed., Suggested low cholesterol diet., Encouraged aerobic exercise., Discussed foot care., Reminded to get yearly retinal exam. -     Hemoglobin A1c  Depression, major, in remission ([place]) In remission  OSA on CPAP Weight loss advised, continue CPAP  Gastroesophageal reflux disease, esophagitis presence not specified Continue PPI/H2 blocker, diet discussed  IBS (irritable bowel syndrome) Diet controlled  Fibromyalgia Increase exercise, healthy eating  BPH (benign prostatic hyperplasia) Continue to monitor  Testosterone deficiency Weight loss advised  Vitamin D deficiency Continue supplement  Medication management -     Magnesium  Encounter for Medicare annual wellness exam  Plantar fasciitis of left foot Discussed options, would like to try injection here and we will send in difclofenac patch to use for acute pain Dexamethasone shot, tolerated well.   Over 30 minutes of exam, counseling, chart review, and critical decision making was performed  Future Appointments Date Time Provider [place]  09/15/2016 10:00 AM [name], MD [facility] None     Plan:   During the course of the visit the patient was educated and counseled about appropriate screening and preventive services including:    Pneumococcal vaccine   Influenza vaccine  Prevnar 13  Td vaccine  Screening electrocardiogram  Colorectal cancer screening  Diabetes  screening  Glaucoma screening  Nutrition counseling    Subjective:  Cody Hines is a 70 y.o. male who presents for Medicare Annual Wellness Visit and 3 month follow up for HTN, hyperlipidemia, prediabetes, and vitamin D Def.   His blood pressure has been controlled at home, today their BP is BP: 120/60  He does not workout. He denies chest pain, shortness of breath, dizziness. Depression is controlled with medications.  He has been having left plantar fasciitis for 3-4 months, has done exercises, ice, new shoes and it has not helped. He would like a referral to ortho/podiatry.   He is on cholesterol medication and denies myalgias. His cholesterol is at goal. The cholesterol last visit was:   Lab Results  Component Value Date   CHOL 160 02/18/2016   HDL 49 02/18/2016   LDLCALC 61 02/18/2016   TRIG 251 (H) 02/18/2016   CHOLHDL 3.3 02/18/2016    He has been working on diet and exercise for prediabetes, and denies paresthesia of the feet, polydipsia, polyuria and visual disturbances. Last A1C in the office was:  Lab Results  Component Value Date   HGBA1C 5.9 (H) 02/18/2016   Last GFR Lab Results  Component Value Date   GFRNONAA 48 (L) 02/18/2016   Patient is on Vitamin D supplement.   Lab Results  Component Value Date   VD25OH 48 02/18/2016     BMI is Body mass index is 29.66 kg/m., he is working on diet and exercise. Wt Readings from Last 3 Encounters:  06/06/16 224 lb 12.8 oz (102 kg)  02/18/16 225 lb 6.4 oz (102.2 kg)  12/10/15 220 lb (99.8 kg)    Medication Review: Current Outpatient  Prescriptions on File Prior to Visit  Medication Sig Dispense Refill  . ALPRAZolam (XANAX) 0.5 MG tablet Take 1 tablet (0.5 mg total) by mouth 3 (three) times daily as needed for anxiety or sleep. 90 tablet 1  . aspirin EC 81 MG tablet Take 81 mg by mouth daily.    Marland Kitchen buPROPion (WELLBUTRIN XL) 300 MG 24 hr tablet Take 1 tablet by mouth  daily 90 tablet 1  . CRESTOR 40 MG tablet  Take 1 tablet by mouth  daily 120 tablet 1  . enalapril (VASOTEC) 20 MG tablet Take 1 tablet by mouth  every day for blood  pressure 120 tablet 0  . Flaxseed, Linseed, (FLAX SEED OIL) 1000 MG CAPS Take 1 capsule by mouth 2 (two) times daily.     . Magnesium 500 MG TABS Take by mouth daily.    . mometasone (NASONEX) 50 MCG/ACT nasal spray Place 2 sprays into the nose daily. 17 g 2  . Vitamin D, Ergocalciferol, (DRISDOL) 50000 UNITS CAPS capsule Take 50,000 Units by mouth daily.      No current facility-administered medications on file prior to visit.     Allergies: Allergies  Allergen Reactions  . Asa [Aspirin]   . Atorvastatin     Muscle aches  . Celebrex [Celecoxib]   . Codeine   . Penicillins   . Savella [Milnacipran Hcl]   . Viagra [Sildenafil Citrate] Other (See Comments)    Headache    Current Problems (verified) has Hypertension; Hyperlipidemia; GERD (gastroesophageal reflux disease); Prediabetes; IBS (irritable bowel syndrome); OSA on CPAP; Fibromyalgia; BPH (benign prostatic hyperplasia); Testosterone deficiency; Medication management; Vitamin D deficiency; Depression, major, in remission ([place]); Encounter for Medicare annual wellness exam; and Plantar fasciitis of left foot on his problem list.  Screening Tests Immunization History  Administered Date(s) Administered  . Pneumococcal-Unspecified 07/16/2011  . Tdap 01/13/2010  . Zoster 08/09/2013   Preventative care: Last colonoscopy: 2013 per patient CXR 2009 CT AB 2009  Prior vaccinations: TD or Tdap: 2011  Influenza: declines Pneumococcal: 2012 Prevnar13: declines Shingles/Zostavax: 2014  Names of Other Physician/Practitioners you currently use: 1. [facility] here for primary care 2. Dr Katy Fitch, eye doctor, last visit  2016 3. Dr Radford Pax, DDS , dentist, last visit Jan 2017 Patient Care Team: [name], MD as PCP - General (Internal Medicine) Druscilla Brownie, MD as  Consulting Physician (Dermatology) Teena Irani, MD as Consulting Physician (Gastroenterology) Newman Pies, MD as Consulting Physician (Neurosurgery) Clent Jacks, MD as Consulting Physician (Ophthalmology)  Surgical: He  has a past surgical history that includes Spine surgery (2007); Knee arthroscopy (Left, 1999);  nasal smr np3 (1985); and Vasectomy (1983). Family His family history includes Arthritis in his sister; Cancer in his father and mother; Diabetes in his father; Heart disease in his sister. Social history  He reports that he quit smoking about 35 years ago. His smoking use included Cigarettes. He quit after 30.00 years of use. He does not have any smokeless tobacco history on file. He reports that he drinks alcohol. He reports that he does not use drugs.  MEDICARE WELLNESS OBJECTIVES: Physical activity: Current Exercise Habits: The patient does not participate in regular exercise at present (works 2 days a week) Cardiac risk factors: Cardiac Risk Factors include: advanced age (>64men, >37 women);hypertension;sedentary lifestyle;dyslipidemia;male gender;family history of premature cardiovascular disease Depression/mood screen:   Depression screen [facility] 2/9 06/06/2016  Decreased Interest 0  Down, Depressed, Hopeless 0  PHQ - 2 Score 0  ADLs:  In your present state of health, do you have any difficulty performing the following activities: 06/06/2016 02/18/2016  Hearing? N N  Vision? - N  Difficulty concentrating or making decisions? N N  Walking or climbing stairs? N N  Dressing or bathing? N N  Doing errands, shopping? N N  Some recent data might be hidden     Cognitive Testing  Alert? Yes  Normal Appearance?Yes  Oriented to person? Yes  Place? Yes   Time? Yes  Recall of three objects?  Yes  Can perform simple calculations? Yes  Displays appropriate judgment?Yes  Can read the correct time from a watch face?Yes  EOL planning: Does patient have an advance directive?:  No Does patient want to make changes to advanced directive?: Yes - information given Copy of advanced directive(s) in chart?: No - copy requested Would patient like information on creating an advanced directive?: Yes - Educational materials given   Objective:   Today's Vitals   06/06/16 1130  BP: 120/60  Pulse: 61  Resp: 14  Temp: 97.7 F (36.5 C)  SpO2: 96%  Weight: 224 lb 12.8 oz (102 kg)  Height: 6\' 1"  (1.854 m)  PainSc: 4   PainLoc: Foot   Body mass index is 29.66 kg/m.  General appearance: alert, no distress, WD/WN, male HEENT: normocephalic, sclerae anicteric, TMs pearly, nares patent, no discharge or erythema, pharynx normal Oral cavity: MMM, no lesions Neck: supple, no lymphadenopathy, no thyromegaly, no masses Heart: RRR, normal S1, S2, no murmurs Lungs: CTA bilaterally, no wheezes, rhonchi, or rales Abdomen: +bs, soft, non tender, non distended, no masses, no hepatomegaly, no splenomegaly Musculoskeletal: nontender, no swelling, no obvious deformity Extremities: no edema, no cyanosis, no clubbing Pulses: 2+ symmetric, upper and lower extremities, normal cap refill Neurological: alert, oriented x 3, CN2-12 intact, strength normal upper extremities and lower extremities, sensation normal throughout, DTRs 2+ throughout, no cerebellar signs, gait normal Psychiatric: normal affect, behavior normal, pleasant   Medicare Attestation I have personally reviewed: The patient's medical and social history Their use of alcohol, tobacco or illicit drugs Their current medications and supplements The patient's functional ability including ADLs,fall risks, home safety risks, cognitive, and hearing and visual impairment Diet and physical activities Evidence for depression or mood disorders  The patient's weight, height, BMI, and visual acuity have been recorded in the chart.  I have made referrals, counseling, and provided education to the patient based on review of the above and  I have provided the patient with a written personalized care plan for preventive services.     Vicie Mutters, PA-C   06/06/2016

## 2016-06-07 LAB — HEMOGLOBIN A1C
HEMOGLOBIN A1C: 5.6 % (ref <5.7)
Mean Plasma Glucose: 114 mg/dL

## 2016-06-29 ENCOUNTER — Other Ambulatory Visit: Payer: Self-pay | Admitting: Internal Medicine

## 2016-08-19 ENCOUNTER — Ambulatory Visit (INDEPENDENT_AMBULATORY_CARE_PROVIDER_SITE_OTHER): Payer: Medicare Other | Admitting: Internal Medicine

## 2016-08-19 ENCOUNTER — Encounter: Payer: Self-pay | Admitting: Internal Medicine

## 2016-08-19 VITALS — BP 120/78 | HR 72 | Temp 97.5°F | Resp 16 | Ht 73.0 in | Wt 218.2 lb

## 2016-08-19 DIAGNOSIS — F321 Major depressive disorder, single episode, moderate: Secondary | ICD-10-CM

## 2016-08-19 DIAGNOSIS — F4320 Adjustment disorder, unspecified: Secondary | ICD-10-CM

## 2016-08-19 DIAGNOSIS — I10 Essential (primary) hypertension: Secondary | ICD-10-CM

## 2016-08-19 MED ORDER — CITALOPRAM HYDROBROMIDE 20 MG PO TABS: ORAL_TABLET | ORAL | 1 refills | Status: DC

## 2016-08-19 NOTE — Progress Notes (Signed)
[facility]   [name], M.D.    Uvaldo Bristle. Silverio Lay, P.A.-C      Starlyn Skeans, P.A.-C  [facility]                [address] [place], [place] [id number] Telephone [phone] Telefax [phone] Subjective:    Patient ID: Cody Hines, male    DOB: March 20, 1946, 70 y.o.   MRN: [id number]  HPI  Patient is a very nice 70 yo MWM with hx/o HTN, HLD, PreDM, Vit D Deficiency and also prior hx/o controlled Depression over the last 20+ years. Now he reports worsening Depression over the last 3 months in part triggered by his only child/daughter's marital separation compounded by him being responsible for a sizable educational student loan that he co-signed for his now estranged ex-son-in-law who declines to assume responsibility for the loan. Patient reports moods swings and episodes of rage and frequent verbal altercations with his wife of 37 years. Also , he admits having resumed increased alcohol consumption. He endorses increased anxiety and having difficulty controlling his feeling of rage , but does not feel that he would act to do anything to harm anyone else or himself. He does admit some crying spells when alone. . He has lost 7 # over the last 3 months which he attributes to loss of appetite.   Medication Sig  . ALPRAZolam (XANAX) 0.5 MG tablet Take 1 tablet (0.5 mg total) by mouth 3 (three) times daily as needed for anxiety or sleep.  Marland Kitchen aspirin EC 81 MG tablet Take 81 mg by mouth daily.  Marland Kitchen buPROPion (WELLBUTRIN XL) 300 MG 24 hr tablet TAKE 1 TABLET BY MOUTH  DAILY  . CRESTOR 40 MG tablet Take 1 tablet by mouth  daily  . diclofenac (FLECTOR) 1.3 % PTCH Place 1 patch onto the skin 2 (two) times daily.  . enalapril (VASOTEC) 20 MG tablet TAKE 1 TABLET BY MOUTH  EVERY DAY FOR BLOOD  PRESSURE  . Flaxseed, Linseed, (FLAX SEED OIL) 1000 MG CAPS Take 1 capsule by mouth 2 (two) times daily.   . Magnesium  500 MG TABS Take by mouth daily.  . mometasone (NASONEX) 50 MCG/ACT nasal spray Place 2 sprays into the nose daily.  . Vitamin D, Ergocalciferol, (DRISDOL) 50000 UNITS CAPS capsule Take 50,000 Units by mouth daily.    Allergies  Allergen Reactions  . Asa [Aspirin]   . Atorvastatin     Muscle aches  . Celebrex [Celecoxib]   . Codeine   . Penicillins   . Savella [Milnacipran Hcl]   . Viagra [Sildenafil Citrate] Other (See Comments)    Headache   Past Medical History:  Diagnosis Date  . BPH (benign prostatic hyperplasia)   . Elevated hemoglobin A1c   . Fibromyalgia   . GERD (gastroesophageal reflux disease)   . Hyperlipidemia   . Hypertension   . Hypogonadism male   . IBS (irritable bowel syndrome)   . OSA (obstructive sleep apnea)    Past Surgical History:  Procedure Laterality Date  .  nasal smr np3  1985  . KNEE ARTHROSCOPY Left 1999  . SPINE SURGERY  2007   L5 S 1 Disk  . VASECTOMY  1983   Review of Systems  10 point systems review negative except as above.      Objective:   Physical Exam  BP  120/78   P 72   T 97.5 F    R 16   Ht 6\' 1"  Wt 218 lb    SpO2 98%   BMI 28.79    HEENT - WNL. Neck - supple. Nl Thyroid. Carotids 2+ & No bruits, nodes, JVD Chest - Clear. Cor - Nl HS. RRR w/o sig M. PP 1(+). No edema. MS- FROM w/o deformities. Muscle power, tone and bulk Nl. Gait Nl. Neuro - Nl w/o focal abnormalities. Psyche - Mental status depressed and occasionally tearful dening any SI or HI.  No delusions, paranoia or ideations    Assessment & Plan:   1. Essential hypertension   2. Moderate single current episode of major depressive disorder ([place])   3. Adjustment disorder, unspecified type  - Citalopram 20 mg #90 x 1 rf - take 1 tab daily.   - Advised that the Citalopram may take up to 2-4 weeks before effect may be noted.   - Advised to use his Alprazolam 1/2 tab  on a prn basis for his anxiety in the meantime as he's not been using it except for  sleep.   - Offered counseling with Dr Romilda Joy when he agrees.   - advised if he feels total loss of control to go to [facility] for Psych Evaluation.   - ROV 3 weeks or sooner if he feels the need  - Over 25 minutes of exam, counseling, chart review andritical decision making was performed

## 2016-09-15 ENCOUNTER — Ambulatory Visit (INDEPENDENT_AMBULATORY_CARE_PROVIDER_SITE_OTHER): Payer: Medicare Other | Admitting: Internal Medicine

## 2016-09-15 ENCOUNTER — Encounter: Payer: Self-pay | Admitting: Internal Medicine

## 2016-09-15 VITALS — BP 116/68 | HR 60 | Temp 97.3°F | Resp 16 | Ht 72.0 in | Wt 218.4 lb

## 2016-09-15 DIAGNOSIS — M797 Fibromyalgia: Secondary | ICD-10-CM

## 2016-09-15 DIAGNOSIS — R7303 Prediabetes: Secondary | ICD-10-CM | POA: Diagnosis not present

## 2016-09-15 DIAGNOSIS — G4733 Obstructive sleep apnea (adult) (pediatric): Secondary | ICD-10-CM

## 2016-09-15 DIAGNOSIS — Z125 Encounter for screening for malignant neoplasm of prostate: Secondary | ICD-10-CM | POA: Diagnosis not present

## 2016-09-15 DIAGNOSIS — Z9989 Dependence on other enabling machines and devices: Secondary | ICD-10-CM

## 2016-09-15 DIAGNOSIS — K219 Gastro-esophageal reflux disease without esophagitis: Secondary | ICD-10-CM

## 2016-09-15 DIAGNOSIS — E349 Endocrine disorder, unspecified: Secondary | ICD-10-CM | POA: Diagnosis not present

## 2016-09-15 DIAGNOSIS — Z136 Encounter for screening for cardiovascular disorders: Secondary | ICD-10-CM

## 2016-09-15 DIAGNOSIS — Z79899 Other long term (current) drug therapy: Secondary | ICD-10-CM

## 2016-09-15 DIAGNOSIS — E559 Vitamin D deficiency, unspecified: Secondary | ICD-10-CM | POA: Diagnosis not present

## 2016-09-15 DIAGNOSIS — E782 Mixed hyperlipidemia: Secondary | ICD-10-CM

## 2016-09-15 DIAGNOSIS — I10 Essential (primary) hypertension: Secondary | ICD-10-CM

## 2016-09-15 DIAGNOSIS — N320 Bladder-neck obstruction: Secondary | ICD-10-CM | POA: Diagnosis not present

## 2016-09-15 DIAGNOSIS — Z1212 Encounter for screening for malignant neoplasm of rectum: Secondary | ICD-10-CM

## 2016-09-15 LAB — CBC WITH DIFFERENTIAL/PLATELET
Basophils Absolute: 0 cells/uL (ref 0–200)
Basophils Relative: 0 %
Eosinophils Absolute: 130 cells/uL (ref 15–500)
Eosinophils Relative: 2 %
HEMATOCRIT: 44.9 % (ref 38.5–50.0)
Hemoglobin: 14.8 g/dL (ref 13.2–17.1)
LYMPHS PCT: 37 %
Lymphs Abs: 2405 cells/uL (ref 850–3900)
MCH: 29.7 pg (ref 27.0–33.0)
MCHC: 33 g/dL (ref 32.0–36.0)
MCV: 90.2 fL (ref 80.0–100.0)
MONO ABS: 520 {cells}/uL (ref 200–950)
MPV: 11.4 fL (ref 7.5–12.5)
Monocytes Relative: 8 %
NEUTROS PCT: 53 %
Neutro Abs: 3445 cells/uL (ref 1500–7800)
PLATELETS: 210 10*3/uL (ref 140–400)
RBC: 4.98 MIL/uL (ref 4.20–5.80)
RDW: 13.5 % (ref 11.0–15.0)
WBC: 6.5 10*3/uL (ref 3.8–10.8)

## 2016-09-15 LAB — HEMOGLOBIN A1C
Hgb A1c MFr Bld: 5.4 % (ref <5.7)
MEAN PLASMA GLUCOSE: 108 mg/dL

## 2016-09-15 LAB — PSA: PSA: 0.7 ng/mL (ref <=4.0)

## 2016-09-15 LAB — TSH: TSH: 3.01 mIU/L (ref 0.40–4.50)

## 2016-09-15 NOTE — Progress Notes (Signed)
[facility]   [name], M.D.    Uvaldo Bristle. Silverio Lay, P.A.-C      Starlyn Skeans, P.A.-C  [facility]                [address] [place], [place] [id number] Telephone [phone] Telefax [phone] Comprehensive Evaluation & Examination     This very nice 70 y.o. MWM presents for a  comprehensive evaluation and management of multiple medical co-morbidities.  Patient has been followed for HTN, T2_NIDDM  Prediabetes, Hyperlipidemia and Vitamin D Deficiency. Patient has hx/o depression and anxiety and 1 month ago had Citalopram added to his Bupropion and he's reporting improved sense of well-being. Patimnt also is on CPAP for OSA & reports improved sleep hygiene and less daytime fatige and myalgias.      HTN predates since 57. Patient's BP has been controlled at home.  Today's BP is at goal - 116/68. Patient denies any cardiac symptoms as chest pain, palpitations, shortness of breath, dizziness or ankle swelling.     Patient's hyperlipidemia is controlled with diet and medications. Patient denies myalgias or other medication SE's. Last lipids were at goal:  Lab Results  Component Value Date   CHOL 160 06/06/2016   HDL 60 06/06/2016   LDLCALC 68 06/06/2016   TRIG 158 (H) 06/06/2016   CHOLHDL 2.7 06/06/2016      Patient has T2_NIDDM since 2001 attempting dietary control and he has CKD 3 (GFR 44). Patient denies reactive hypoglycemic symptoms, visual blurring, diabetic polys or paresthesias. Last A1c was at goal: Lab Results  Component Value Date   HGBA1C 5.6 06/06/2016       Finally, patient has history of Vitamin D Deficiency in 2008  Of "29" and last vitamin D was low (goal is betw 70-100): Lab Results  Component Value Date   VD25OH 48 02/18/2016   Current Outpatient Prescriptions on File Prior to Visit  Medication Sig  . ALPRAZolam (XANAX) 0.5 MG tablet Take 1 tablet (0.5 mg total) by  mouth 3 (three) times daily as needed for anxiety or sleep.  Marland Kitchen aspirin EC 81 MG tablet Take 81 mg by mouth daily.  Marland Kitchen buPROPion (WELLBUTRIN XL) 300 MG 24 hr tablet TAKE 1 TABLET BY MOUTH  DAILY  . citalopram (CELEXA) 20 MG tablet Take 1 tablet forAnxiety Agitation & depessiuon  . CRESTOR 40 MG tablet Take 1 tablet by mouth  daily  . enalapril (VASOTEC) 20 MG tablet TAKE 1 TABLET BY MOUTH  EVERY DAY FOR BLOOD  PRESSURE  . Flaxseed, Linseed, (FLAX SEED OIL) 1000 MG CAPS Take 1 capsule by mouth 2 (two) times daily.   . Magnesium 500 MG TABS Take by mouth daily.   No current facility-administered medications on file prior to visit.    Allergies  Allergen Reactions  . Asa [Aspirin]   . Atorvastatin     Muscle aches  . Celebrex [Celecoxib]   . Codeine   . Penicillins   . Savella [Milnacipran Hcl]   . Viagra [Sildenafil Citrate] Other (See Comments)    Headache   Past Medical History:  Diagnosis Date  . BPH (benign prostatic hyperplasia)   . Elevated hemoglobin A1c   . Fibromyalgia   . GERD (gastroesophageal reflux disease)   . Hyperlipidemia   . Hypertension   . Hypogonadism male   . IBS (irritable bowel syndrome)   .  OSA (obstructive sleep apnea)    Health Maintenance  Topic Date Due  . COLONOSCOPY  08/19/1998  . PNA vac Low Risk Adult (1 of 2 - PCV13) 08/19/2013  . INFLUENZA VACCINE  05/06/2016  . TETANUS/TDAP  01/14/2020  . ZOSTAVAX  Completed  . Hepatitis C Screening  Completed   Immunization History  Administered Date(s) Administered  . Pneumococcal-Unspecified 07/16/2011  . Tdap 01/13/2010  . Zoster 08/09/2013   Past Surgical History:  Procedure Laterality Date  .  nasal smr np3  1985  . KNEE ARTHROSCOPY Left 1999  . SPINE SURGERY  2007   L5 S 1 Disk  . VASECTOMY  1983   Family History  Problem Relation Age of Onset  . Cancer Mother     breast  . Cancer Father     lung  . Diabetes Father   . Heart disease Sister   . Arthritis Sister    Social History    Social History  . Marital status: Single    Spouse name: N/A  . Number of children: N/A  . Years of education: N/A   Occupational History  . retired Agricultural consultant   Social History Main Topics  . Smoking status: Former Smoker    Years: 30.00    Types: Cigarettes    Quit date: 10/06/1980  . Smokeless tobacco: Not on file  . Alcohol use Yes     Comment: Beer- 12 cans per week  . Drug use: No    ROS Constitutional: Denies fever, chills, weight loss/gain, headaches, insomnia,  night sweats or change in appetite. Does c/o fatigue. Eyes: Denies redness, blurred vision, diplopia, discharge, itchy or watery eyes.  ENT: Denies discharge, congestion, post nasal drip, epistaxis, sore throat, earache, hearing loss, dental pain, Tinnitus, Vertigo, Sinus pain or snoring.  Cardio: Denies chest pain, palpitations, irregular heartbeat, syncope, dyspnea, diaphoresis, orthopnea, PND, claudication or edema Respiratory: denies cough, dyspnea, DOE, pleurisy, hoarseness, laryngitis or wheezing.  Gastrointestinal: Denies dysphagia, heartburn, reflux, water brash, pain, cramps, nausea, vomiting, bloating, diarrhea, constipation, hematemesis, melena, hematochezia, jaundice or hemorrhoids Genitourinary: Denies dysuria, frequency, urgency, nocturia, hesitancy, discharge, hematuria or flank pain Musculoskeletal: Denies arthralgia, myalgia, stiffness, Jt. Swelling, pain, limp or strain/sprain. Denies Falls. Skin: Denies puritis, rash, hives, warts, acne, eczema or change in skin lesion Neuro: No weakness, tremor, incoordination, spasms, paresthesia or pain Psychiatric: Denies confusion, memory loss or sensory loss. Denies Depression. Endocrine: Denies change in weight, skin, hair change, nocturia, and paresthesia, diabetic polys, visual blurring or hyper / hypo glycemic episodes.  Heme/Lymph: No excessive bleeding, bruising or enlarged lymph nodes.  Physical Exam  BP 116/68   Pulse 60   Temp 97.3 F (36.3 C)    Resp 16   Ht 6' (1.829 m)   Wt 218 lb 6.4 oz (99.1 kg)   BMI 29.62 kg/m   General Appearance: Well nourished, in no apparent distress.  Eyes: PERRLA, EOMs, conjunctiva no swelling or erythema, normal fundi and vessels. Sinuses: No frontal/maxillary tenderness ENT/Mouth: EACs patent / TMs  nl. Nares clear without erythema, swelling, mucoid exudates. Oral hygiene is good. No erythema, swelling, or exudate. Tongue normal, non-obstructing. Tonsils not swollen or erythematous. Hearing normal.  Neck: Supple, thyroid normal. No bruits, nodes or JVD. Respiratory: Respiratory effort normal.  BS equal and clear bilateral without rales, rhonci, wheezing or stridor. Cardio: Heart sounds are normal with regular rate and rhythm and no murmurs, rubs or gallops. Peripheral pulses are normal and equal bilaterally without edema. No aortic or femoral  bruits. Chest: symmetric with normal excursions and percussion.  Abdomen: Soft, with Nl bowel sounds. Nontender, no guarding, rebound, hernias, masses, or organomegaly.  Lymphatics: Non tender without lymphadenopathy.  Genitourinary: No hernias.Testes nl. DRE - prostate nl for age - smooth & firm w/o nodules. Musculoskeletal: Full ROM all peripheral extremities, joint stability, 5/5 strength, and normal gait. Skin: Warm and dry without rashes, lesions, cyanosis, clubbing or  ecchymosis.  Neuro: Cranial nerves intact, reflexes equal bilaterally. Normal muscle tone, no cerebellar symptoms. Sensation intact.  Pysch: Alert and oriented X 3 with normal affect, insight and judgment appropriate.   Assessment and Plan  1. Essential hypertension  - Microalbumin / creatinine urine ratio - EKG 12-Lead - [place], RETROPERITNL ABD,  LTD - Urinalysis, Routine w reflex microscopic - CBC with Differential/Platelet - BASIC METABOLIC PANEL WITH GFR - TSH  2. Mixed hyperlipidemia  - EKG 12-Lead - [place], RETROPERITNL ABD,  LTD - Hepatic function panel - Lipid panel -  TSH  3. Prediabetes  - EKG 12-Lead - [place], RETROPERITNL ABD,  LTD - Hemoglobin A1c - Insulin, random  4. Vitamin D deficiency  - VITAMIN D 25 Hydroxy   5. OSA on CPAP   6. Gastroesophageal reflux disease  - patient encouraged to slowly and stepwise taper his Nexium to bid and later to QOD.  7. Fibromyalgia   8. Testosterone deficiency   9. Screening for rectal cancer  - POC Hemoccult Bld/Stl   10. Prostate cancer screening  - PSA  11. Screening for ischemic heart disease  - EKG 12-Lead  12. Screening for AAA (aortic abdominal aneurysm)  - [place], RETROPERITNL ABD,  LTD  13. 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  Discussed med effects and SE's. Routine screening labs and tests as requested with regular follow-up as recommended. Over 40 minutes of exam, counseling, chart review and high complex critical decision making was performed

## 2016-09-15 NOTE — Patient Instructions (Signed)

## 2016-09-16 DIAGNOSIS — L570 Actinic keratosis: Secondary | ICD-10-CM | POA: Diagnosis not present

## 2016-09-16 DIAGNOSIS — Z85828 Personal history of other malignant neoplasm of skin: Secondary | ICD-10-CM | POA: Diagnosis not present

## 2016-09-16 LAB — LIPID PANEL
CHOLESTEROL: 143 mg/dL (ref <200)
HDL: 52 mg/dL (ref >40)
LDL Cholesterol: 64 mg/dL (ref <100)
TRIGLYCERIDES: 135 mg/dL (ref <150)
Total CHOL/HDL Ratio: 2.8 Ratio (ref <5.0)
VLDL: 27 mg/dL (ref <30)

## 2016-09-16 LAB — URINALYSIS, ROUTINE W REFLEX MICROSCOPIC
Bilirubin Urine: NEGATIVE
Glucose, UA: NEGATIVE
HGB URINE DIPSTICK: NEGATIVE
KETONES UR: NEGATIVE
LEUKOCYTES UA: NEGATIVE
NITRITE: NEGATIVE
PH: 6 (ref 5.0–8.0)
PROTEIN: NEGATIVE
Specific Gravity, Urine: 1.009 (ref 1.001–1.035)

## 2016-09-16 LAB — VITAMIN D 25 HYDROXY (VIT D DEFICIENCY, FRACTURES): Vit D, 25-Hydroxy: 60 ng/mL (ref 30–100)

## 2016-09-16 LAB — HEPATIC FUNCTION PANEL
ALBUMIN: 4.5 g/dL (ref 3.6–5.1)
ALT: 27 U/L (ref 9–46)
AST: 26 U/L (ref 10–35)
Alkaline Phosphatase: 42 U/L (ref 40–115)
BILIRUBIN TOTAL: 0.6 mg/dL (ref 0.2–1.2)
Bilirubin, Direct: 0.1 mg/dL (ref <=0.2)
Indirect Bilirubin: 0.5 mg/dL (ref 0.2–1.2)
TOTAL PROTEIN: 6.6 g/dL (ref 6.1–8.1)

## 2016-09-16 LAB — BASIC METABOLIC PANEL WITH GFR
BUN: 15 mg/dL (ref 7–25)
CALCIUM: 9.6 mg/dL (ref 8.6–10.3)
CO2: 23 mmol/L (ref 20–31)
Chloride: 111 mmol/L — ABNORMAL HIGH (ref 98–110)
Creat: 1.58 mg/dL — ABNORMAL HIGH (ref 0.70–1.25)
GFR, EST AFRICAN AMERICAN: 51 mL/min — AB (ref >=60)
GFR, EST NON AFRICAN AMERICAN: 44 mL/min — AB (ref >=60)
GLUCOSE: 94 mg/dL (ref 65–99)
Potassium: 4.6 mmol/L (ref 3.5–5.3)
Sodium: 140 mmol/L (ref 135–146)

## 2016-09-16 LAB — MAGNESIUM: Magnesium: 2.2 mg/dL (ref 1.5–2.5)

## 2016-09-16 LAB — MICROALBUMIN / CREATININE URINE RATIO
CREATININE, URINE: 74 mg/dL (ref 20–370)
MICROALB UR: 0.2 mg/dL
MICROALB/CREAT RATIO: 3 ug/mg{creat} (ref <30)

## 2016-09-16 LAB — INSULIN, RANDOM: INSULIN: 10.9 u[IU]/mL (ref 2.0–19.6)

## 2016-11-10 ENCOUNTER — Other Ambulatory Visit: Payer: Self-pay | Admitting: *Deleted

## 2016-11-10 MED ORDER — CITALOPRAM HYDROBROMIDE 20 MG PO TABS: ORAL_TABLET | ORAL | 1 refills | Status: DC

## 2016-11-18 DIAGNOSIS — M1712 Unilateral primary osteoarthritis, left knee: Secondary | ICD-10-CM | POA: Diagnosis not present

## 2016-11-27 ENCOUNTER — Other Ambulatory Visit: Payer: Self-pay | Admitting: *Deleted

## 2016-11-27 MED ORDER — ENALAPRIL MALEATE 20 MG PO TABS: ORAL_TABLET | ORAL | 1 refills | Status: DC

## 2016-12-02 DIAGNOSIS — M1712 Unilateral primary osteoarthritis, left knee: Secondary | ICD-10-CM | POA: Diagnosis not present

## 2016-12-08 DIAGNOSIS — M25562 Pain in left knee: Secondary | ICD-10-CM | POA: Diagnosis not present

## 2016-12-19 ENCOUNTER — Ambulatory Visit (INDEPENDENT_AMBULATORY_CARE_PROVIDER_SITE_OTHER): Payer: Medicare Other | Admitting: Internal Medicine

## 2016-12-19 ENCOUNTER — Encounter: Payer: Self-pay | Admitting: Internal Medicine

## 2016-12-19 VITALS — BP 126/60 | HR 66 | Temp 98.0°F | Resp 18 | Ht 72.0 in | Wt 223.0 lb

## 2016-12-19 DIAGNOSIS — M722 Plantar fascial fibromatosis: Secondary | ICD-10-CM

## 2016-12-19 DIAGNOSIS — G8929 Other chronic pain: Secondary | ICD-10-CM

## 2016-12-19 DIAGNOSIS — I10 Essential (primary) hypertension: Secondary | ICD-10-CM

## 2016-12-19 DIAGNOSIS — E559 Vitamin D deficiency, unspecified: Secondary | ICD-10-CM | POA: Diagnosis not present

## 2016-12-19 DIAGNOSIS — M25562 Pain in left knee: Secondary | ICD-10-CM

## 2016-12-19 DIAGNOSIS — M25561 Pain in right knee: Secondary | ICD-10-CM | POA: Diagnosis not present

## 2016-12-19 DIAGNOSIS — E782 Mixed hyperlipidemia: Secondary | ICD-10-CM

## 2016-12-19 DIAGNOSIS — Z79899 Other long term (current) drug therapy: Secondary | ICD-10-CM

## 2016-12-19 DIAGNOSIS — R7303 Prediabetes: Secondary | ICD-10-CM

## 2016-12-19 DIAGNOSIS — G250 Essential tremor: Secondary | ICD-10-CM | POA: Diagnosis not present

## 2016-12-19 NOTE — Progress Notes (Signed)
Assessment and Plan:  Hypertension:  -cont enalapril Well controlled -Continue medication,  -monitor blood pressure at home.  -Continue DASH diet.   -Reminder to go to the ER if any CP, SOB, nausea, dizziness, severe HA, changes vision/speech, left arm numbness and tingling, and jaw pain.  Cholesterol: -cont crestor -stable and well controlled -Continue diet and exercise.   Pre-diabetes: -Continue diet and exercise.   Vitamin D Def: -continue medications.   Bilateral knee pain -currently seeing ortho -about to start synvisc shots  Plantar fasciitis -is improving currently  Essential tremor -does not have any cogwheel rigidity -could potentially discuss the use of beta blocker if desired in future -currently not asking for further medication  Insomnia -currently well controlled on xanax  Continue diet and meds as discussed. Further disposition pending results of labs.  HPI 71 y.o. male  presents for 3 month follow up with hypertension, hyperlipidemia, prediabetes and vitamin D.   His blood pressure has been controlled at home, today their BP is BP: 126/60.   He does not workout. He denies chest pain, shortness of breath, dizziness.   He is on cholesterol medication and denies myalgias. His cholesterol is at goal. The cholesterol last visit was:   Lab Results  Component Value Date   CHOL 143 09/15/2016   HDL 52 09/15/2016   LDLCALC 64 09/15/2016   TRIG 135 09/15/2016   CHOLHDL 2.8 09/15/2016     He has been working on diet and exercise for prediabetes, and denies foot ulcerations, hyperglycemia, hypoglycemia , increased appetite, nausea, paresthesia of the feet, polydipsia, polyuria, visual disturbances, vomiting and weight loss. Last A1C in the office was:  Lab Results  Component Value Date   HGBA1C 5.4 09/15/2016    Patient is on Vitamin D supplement.  Lab Results  Component Value Date   VD25OH 60 09/15/2016     He reports that he is currently having  some trouble with his knee.  He reports that he has been seeing ortho for it.  He did have an injection into his knee and this didn't really help much.  He did get an MRI and they are going to do the synvisc injections starting next week.    He does feel that his plantar fasciitis is improving after his injections at the last visit with Vicie Mutters PA-C.    He does have benign essential tremor of the left hand which is stable.  He does admit that it does give him trouble when he is trying to drink.  He takes xanax but this is not for his tremor this is more so to help with sleep.    Current Medications:  Current Outpatient Prescriptions on File Prior to Visit  Medication Sig Dispense Refill  . ALPRAZolam (XANAX) 0.5 MG tablet Take 1 tablet (0.5 mg total) by mouth 3 (three) times daily as needed for anxiety or sleep. 90 tablet 1  . aspirin EC 81 MG tablet Take 81 mg by mouth daily.    Marland Kitchen buPROPion (WELLBUTRIN XL) 300 MG 24 hr tablet TAKE 1 TABLET BY MOUTH  DAILY 90 tablet 1  . citalopram (CELEXA) 20 MG tablet Take 1 tablet forAnxiety Agitation & depessiuon 90 tablet 1  . CRESTOR 40 MG tablet Take 1 tablet by mouth  daily 120 tablet 1  . enalapril (VASOTEC) 20 MG tablet TAKE 1 TABLET BY MOUTH  EVERY DAY FOR BLOOD  PRESSURE 90 tablet 1  . Flaxseed, Linseed, (FLAX SEED OIL) 1000 MG CAPS Take  1 capsule by mouth 2 (two) times daily.     . Magnesium 500 MG TABS Take by mouth daily.    Marland Kitchen OVER THE COUNTER MEDICATION Patient takes Vitamin D daily and thinks it it 5000 units.     No current facility-administered medications on file prior to visit.     Medical History:  Past Medical History:  Diagnosis Date  . BPH (benign prostatic hyperplasia)   . Elevated hemoglobin A1c   . Fibromyalgia   . GERD (gastroesophageal reflux disease)   . Hyperlipidemia   . Hypertension   . Hypogonadism male   . IBS (irritable bowel syndrome)   . OSA (obstructive sleep apnea)     Allergies:  Allergies   Allergen Reactions  . Asa [Aspirin]   . Atorvastatin     Muscle aches  . Celebrex [Celecoxib]   . Codeine   . Penicillins   . Savella [Milnacipran Hcl]   . Viagra [Sildenafil Citrate] Other (See Comments)    Headache     Review of Systems:  Review of Systems  Constitutional: Negative for chills, fever and malaise/fatigue.  HENT: Negative for congestion, ear pain and sore throat.   Eyes: Negative.   Respiratory: Negative for cough, shortness of breath and wheezing.   Cardiovascular: Negative for chest pain, palpitations and leg swelling.  Gastrointestinal: Negative for abdominal pain, blood in stool, constipation, diarrhea, heartburn and melena.  Genitourinary: Negative.   Musculoskeletal: Positive for joint pain and myalgias.  Skin: Negative.   Neurological: Positive for tremors. Negative for dizziness, sensory change, loss of consciousness and headaches.  Psychiatric/Behavioral: Negative for depression. The patient is not nervous/anxious and does not have insomnia.     Family history- Review and unchanged  Social history- Review and unchanged  Physical Exam: BP 126/60   Pulse 66   Temp 98 F (36.7 C) (Temporal)   Resp 18   Ht 6' (1.829 m)   Wt 223 lb (101.2 kg)   BMI 30.24 kg/m  Wt Readings from Last 3 Encounters:  12/19/16 223 lb (101.2 kg)  09/15/16 218 lb 6.4 oz (99.1 kg)  08/19/16 218 lb 3.2 oz (99 kg)    General Appearance: Well nourished well developed, in no apparent distress. Eyes: PERRLA, EOMs, conjunctiva no swelling or erythema ENT/Mouth: Ear canals normal without obstruction, swelling, erythma, discharge.  TMs normal bilaterally.  Oropharynx moist, clear, without exudate, or postoropharyngeal swelling. Neck: Supple, thyroid normal,no cervical adenopathy  Respiratory: Respiratory effort normal, Breath sounds clear A&P without rhonchi, wheeze, or rale.  No retractions, no accessory usage. Cardio: RRR with no MRGs. Brisk peripheral pulses without edema.   Abdomen: Soft, + BS,  Non tender, no guarding, rebound, hernias, masses. Musculoskeletal: Full ROM, 5/5 strength, Mild shuffling antalgic gait Skin: Warm, dry without rashes, lesions, ecchymosis.  Neuro: Awake and oriented X 3, Cranial nerves intact. Normal muscle tone, no cerebellar symptoms.  Mild tremor in bilateral hands L>R.  Psych: relatively flat affect, Insight and Judgment appropriate.    Starlyn Skeans, PA-C 10:26 AM [facility]

## 2016-12-23 DIAGNOSIS — M1711 Unilateral primary osteoarthritis, right knee: Secondary | ICD-10-CM | POA: Diagnosis not present

## 2016-12-30 DIAGNOSIS — M1712 Unilateral primary osteoarthritis, left knee: Secondary | ICD-10-CM | POA: Diagnosis not present

## 2017-01-06 DIAGNOSIS — M1712 Unilateral primary osteoarthritis, left knee: Secondary | ICD-10-CM | POA: Diagnosis not present

## 2017-02-16 ENCOUNTER — Other Ambulatory Visit: Payer: Self-pay | Admitting: Internal Medicine

## 2017-02-16 ENCOUNTER — Other Ambulatory Visit: Payer: Self-pay | Admitting: *Deleted

## 2017-02-16 MED ORDER — ALPRAZOLAM 0.5 MG PO TABS: 0.5000 mg | ORAL_TABLET | Freq: Three times a day (TID) | ORAL | 1 refills | Status: DC | PRN

## 2017-03-25 DIAGNOSIS — C44612 Basal cell carcinoma of skin of right upper limb, including shoulder: Secondary | ICD-10-CM | POA: Diagnosis not present

## 2017-03-25 DIAGNOSIS — L821 Other seborrheic keratosis: Secondary | ICD-10-CM | POA: Diagnosis not present

## 2017-03-25 DIAGNOSIS — L570 Actinic keratosis: Secondary | ICD-10-CM | POA: Diagnosis not present

## 2017-03-25 DIAGNOSIS — D225 Melanocytic nevi of trunk: Secondary | ICD-10-CM | POA: Diagnosis not present

## 2017-03-25 DIAGNOSIS — Z85828 Personal history of other malignant neoplasm of skin: Secondary | ICD-10-CM | POA: Diagnosis not present

## 2017-03-27 ENCOUNTER — Ambulatory Visit: Payer: Self-pay | Admitting: Internal Medicine

## 2017-03-30 DIAGNOSIS — F331 Major depressive disorder, recurrent, moderate: Secondary | ICD-10-CM | POA: Diagnosis not present

## 2017-04-13 DIAGNOSIS — F331 Major depressive disorder, recurrent, moderate: Secondary | ICD-10-CM | POA: Diagnosis not present

## 2017-04-24 ENCOUNTER — Encounter: Payer: Self-pay | Admitting: Internal Medicine

## 2017-04-24 ENCOUNTER — Ambulatory Visit (INDEPENDENT_AMBULATORY_CARE_PROVIDER_SITE_OTHER): Payer: Medicare Other | Admitting: Internal Medicine

## 2017-04-24 VITALS — BP 120/60 | HR 64 | Temp 97.7°F | Resp 20 | Ht 72.0 in | Wt 222.2 lb

## 2017-04-24 DIAGNOSIS — R7303 Prediabetes: Secondary | ICD-10-CM

## 2017-04-24 DIAGNOSIS — I10 Essential (primary) hypertension: Secondary | ICD-10-CM | POA: Diagnosis not present

## 2017-04-24 DIAGNOSIS — E782 Mixed hyperlipidemia: Secondary | ICD-10-CM

## 2017-04-24 DIAGNOSIS — Z79899 Other long term (current) drug therapy: Secondary | ICD-10-CM | POA: Diagnosis not present

## 2017-04-24 DIAGNOSIS — E559 Vitamin D deficiency, unspecified: Secondary | ICD-10-CM

## 2017-04-24 DIAGNOSIS — G4733 Obstructive sleep apnea (adult) (pediatric): Secondary | ICD-10-CM | POA: Diagnosis not present

## 2017-04-24 DIAGNOSIS — Z9989 Dependence on other enabling machines and devices: Secondary | ICD-10-CM

## 2017-04-24 LAB — CBC WITH DIFFERENTIAL/PLATELET
BASOS PCT: 0 %
Basophils Absolute: 0 cells/uL (ref 0–200)
Eosinophils Absolute: 134 cells/uL (ref 15–500)
Eosinophils Relative: 2 %
HCT: 44 % (ref 38.5–50.0)
Hemoglobin: 14.5 g/dL (ref 13.2–17.1)
LYMPHS PCT: 29 %
Lymphs Abs: 1943 cells/uL (ref 850–3900)
MCH: 30.3 pg (ref 27.0–33.0)
MCHC: 33 g/dL (ref 32.0–36.0)
MCV: 92.1 fL (ref 80.0–100.0)
MONOS PCT: 10 %
MPV: 11.2 fL (ref 7.5–12.5)
Monocytes Absolute: 670 cells/uL (ref 200–950)
Neutro Abs: 3953 cells/uL (ref 1500–7800)
Neutrophils Relative %: 59 %
PLATELETS: 197 10*3/uL (ref 140–400)
RBC: 4.78 MIL/uL (ref 4.20–5.80)
RDW: 13.5 % (ref 11.0–15.0)
WBC: 6.7 10*3/uL (ref 3.8–10.8)

## 2017-04-24 LAB — LIPID PANEL
CHOLESTEROL: 140 mg/dL (ref <200)
HDL: 54 mg/dL (ref >40)
LDL CALC: 53 mg/dL (ref <100)
TRIGLYCERIDES: 164 mg/dL — AB (ref <150)
Total CHOL/HDL Ratio: 2.6 Ratio (ref <5.0)
VLDL: 33 mg/dL — ABNORMAL HIGH (ref <30)

## 2017-04-24 LAB — BASIC METABOLIC PANEL WITH GFR
BUN: 12 mg/dL (ref 7–25)
CO2: 24 mmol/L (ref 20–31)
Calcium: 9.2 mg/dL (ref 8.6–10.3)
Chloride: 104 mmol/L (ref 98–110)
Creat: 1.53 mg/dL — ABNORMAL HIGH (ref 0.70–1.25)
GFR, EST AFRICAN AMERICAN: 53 mL/min — AB (ref >=60)
GFR, EST NON AFRICAN AMERICAN: 46 mL/min — AB (ref >=60)
Glucose, Bld: 108 mg/dL — ABNORMAL HIGH (ref 65–99)
POTASSIUM: 4.3 mmol/L (ref 3.5–5.3)
Sodium: 138 mmol/L (ref 135–146)

## 2017-04-24 LAB — HEPATIC FUNCTION PANEL
ALBUMIN: 4.2 g/dL (ref 3.6–5.1)
ALK PHOS: 49 U/L (ref 40–115)
ALT: 27 U/L (ref 9–46)
AST: 26 U/L (ref 10–35)
BILIRUBIN TOTAL: 0.6 mg/dL (ref 0.2–1.2)
Bilirubin, Direct: 0.1 mg/dL (ref <=0.2)
Indirect Bilirubin: 0.5 mg/dL (ref 0.2–1.2)
Total Protein: 6.6 g/dL (ref 6.1–8.1)

## 2017-04-24 NOTE — Patient Instructions (Signed)

## 2017-04-24 NOTE — Progress Notes (Signed)
This very nice 71 y.o. MWM presents for 6 month follow up with Hypertension, Hyperlipidemia, T2_NIDDM and Vitamin D Deficiency. Patient has hx/o OSA x 6-7 yrs with original evaluation at [facility] and has stopped using his CPAP due to difficulty with the mask and request re-evaluation. He does endorse poor sleep hygiene and hypersomnolence during the day.      Patient also has recently separated after 67 years marriage and is seeing Dr Clovis Pu at [facility] and had his meds changed from Xanax to Ativan and from [place] to [place]. Estranged wife is spearheading a Environmental health practitioner.     Patient is treated for HTN (1993)  & BP has been controlled at home. Today's BP is at goal - 120/60. Patient has had no complaints of any cardiac type chest pain, palpitations, dyspnea/orthopnea/PND, dizziness, claudication, or dependent edema.     Hyperlipidemia is controlled with diet & meds. Patient denies myalgias or other med SE's. 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 He denies symptoms of reactive hypoglycemia, diabetic polys, paresthesias or visual blurring. A1c was 6.1% in 03/2015 and last A1c was at goal: Lab Results  Component Value Date   HGBA1C 5.4 09/15/2016      Further, the patient also has history of Vitamin D Deficiency ("29" in 2008) and supplements vitamin D without any suspected side-effects. Last vitamin D was at goal: Lab Results  Component Value Date   VD25OH 60 09/15/2016   Current Outpatient Prescriptions on File Prior to Visit  Medication Sig  . aspirin EC 81 MG tablet Take 81 mg by mouth daily.  . CRESTOR 40 MG tablet Take 1 tablet by mouth  daily  . enalapril (VASOTEC) 20 MG tablet TAKE 1 TABLET BY MOUTH  EVERY DAY FOR  BLOOD  PRESSURE  . Flaxseed, Linseed, (FLAX SEED OIL) 1000 MG CAPS Take 1 capsule by mouth 2 (two) times daily.   . Magnesium 500 MG TABS Take by mouth daily.  Marland Kitchen OVER THE COUNTER MEDICATION Patient takes Vitamin D daily and thinks it it 5000 units.   No current facility-administered medications on file prior to visit.    Allergies  Allergen Reactions  . Asa [Aspirin]   . Atorvastatin     Muscle aches  . Celebrex [Celecoxib]   . Codeine   . Penicillins   . Savella [Milnacipran Hcl]   . Viagra [Sildenafil Citrate] Other (See Comments)    Headache   PMHx:   Past Medical History:  Diagnosis Date  . BPH (benign prostatic hyperplasia)   . Elevated hemoglobin A1c   . Fibromyalgia   . GERD (gastroesophageal reflux disease)   . Hyperlipidemia   . Hypertension   . Hypogonadism male   . IBS (irritable bowel syndrome)   . OSA (obstructive sleep apnea)    Immunization History  Administered Date(s) Administered  . Pneumococcal-Unspecified 07/16/2011  . Tdap 01/13/2010  . Zoster 08/09/2013   Past Surgical History:  Procedure Laterality Date  .  nasal smr np3  1985  . KNEE ARTHROSCOPY Left 1999  . SPINE SURGERY  2007   L5 S 1 Disk  . VASECTOMY  1983   FHx:  Reviewed / unchanged  SHx:    Reviewed / unchanged  Systems Review:  Constitutional: Denies fever, chills, wt changes, headaches, insomnia, fatigue, night sweats, change in appetite. Eyes: Denies redness, blurred vision, diplopia, discharge, itchy, watery eyes.  ENT: Denies discharge, congestion, post nasal drip, epistaxis, sore throat, earache, hearing loss, dental pain, tinnitus, vertigo, sinus pain, snoring.  CV: Denies chest pain, palpitations, irregular heartbeat, syncope, dyspnea, diaphoresis, orthopnea, PND, claudication or edema. Respiratory: denies cough, dyspnea, DOE, pleurisy, hoarseness, laryngitis, wheezing.  Gastrointestinal: Denies dysphagia, odynophagia, heartburn, reflux, water brash, abdominal pain or  cramps, nausea, vomiting, bloating, diarrhea, constipation, hematemesis, melena, hematochezia  or hemorrhoids. Genitourinary: Denies dysuria, frequency, urgency, nocturia, hesitancy, discharge, hematuria or flank pain. Musculoskeletal: Denies arthralgias, myalgias, stiffness, jt. swelling, pain, limping or strain/sprain.  Skin: Denies pruritus, rash, hives, warts, acne, eczema or change in skin lesion(s). Neuro: No weakness, tremor, incoordination, spasms, paresthesia or pain. Psychiatric: Denies confusion, memory loss or sensory loss. Endo: Denies change in weight, skin or hair change.  Heme/Lymph: No excessive bleeding, bruising or enlarged lymph nodes.  Physical Exam  BP 120/60   Pulse 64   Temp 97.7 F (36.5 C)   Resp 20   Ht 6' (1.829 m)   Wt 222 lb 3.2 oz (100.8 kg)   BMI 30.14 kg/m   Appears well nourished, well groomed  and in no distress.  Eyes: PERRLA, EOMs, conjunctiva no swelling or erythema. Sinuses: No frontal/maxillary tenderness ENT/Mouth: EAC's clear, TM's nl w/o erythema, bulging. Nares clear w/o erythema, swelling, exudates. Oropharynx clear without erythema or exudates. Oral hygiene is good. Mallampati 2/3. Hearing intact.  Neck: Supple. Thyroid nl. Car 2+/2+ without bruits, nodes or JVD. Chest: Respirations nl with BS clear & equal w/o rales, rhonchi, wheezing or stridor.  Cor: Heart sounds normal w/ regular rate and rhythm without sig. murmurs, gallops, clicks or rubs. Peripheral pulses normal and equal  without edema.  Abdomen: Soft & bowel sounds normal. Non-tender w/o guarding, rebound, hernias, masses or organomegaly.  Lymphatics: Unremarkable.  Musculoskeletal: Full ROM all peripheral extremities, joint stability, 5/5 strength and normal gait.  Skin: Warm, dry without exposed rashes, lesions or ecchymosis apparent.  Neuro: Cranial nerves intact, reflexes equal bilaterally. Sensory-motor testing grossly intact. Tendon reflexes grossly intact.  Pysch: Alert  & oriented x 3.  Insight and judgement nl & appropriate. No ideations.  Assessment and Plan:  1. Essential hypertension  - Continue medication, monitor blood pressure at home.  - Continue DASH diet. Reminder to go to the ER if any CP,  SOB, nausea, dizziness, severe HA, changes vision/speech.  - CBC with Differential/Platelet - BASIC METABOLIC PANEL WITH GFR - Magnesium - TSH  2. Hyperlipidemia, mixed  - Continue diet/meds, exercise,& lifestyle modifications.  - Continue monitor periodic cholesterol/liver & renal functions   - Hepatic function panel - Lipid panel - TSH  3. Prediabetes  - Continue diet, exercise, lifestyle modifications.  - Monitor appropriate labs.  - Hemoglobin A1c - Insulin, random  4. Vitamin D deficiency  - Continue supplementation.  - VITAMIN D 25 Hydroxy   5. OSA on CPAP  -  refer for Sleep re-evaluation  6. Medication management  - CBC with Differential/Platelet - BASIC METABOLIC PANEL WITH GFR - Hepatic function panel - Magnesium - Lipid panel - TSH - Hemoglobin A1c - Insulin, random - VITAMIN D 25 Hydroxy         Discussed  regular exercise, BP monitoring, weight control to achieve/maintain BMI less than 25 and discussed  med and SE's. Recommended labs to assess and monitor clinical status with further disposition pending results of labs. Over 30 minutes of exam, counseling, chart review was performed.

## 2017-04-25 LAB — TSH: TSH: 2.67 mIU/L (ref 0.40–4.50)

## 2017-04-25 LAB — VITAMIN D 25 HYDROXY (VIT D DEFICIENCY, FRACTURES): VIT D 25 HYDROXY: 60 ng/mL (ref 30–100)

## 2017-04-25 LAB — HEMOGLOBIN A1C
Hgb A1c MFr Bld: 5.5 % (ref <5.7)
Mean Plasma Glucose: 111 mg/dL

## 2017-04-25 LAB — MAGNESIUM: Magnesium: 2.2 mg/dL (ref 1.5–2.5)

## 2017-04-27 DIAGNOSIS — F331 Major depressive disorder, recurrent, moderate: Secondary | ICD-10-CM | POA: Diagnosis not present

## 2017-04-27 LAB — INSULIN, RANDOM: INSULIN: 12.4 u[IU]/mL (ref 2.0–19.6)

## 2017-04-28 ENCOUNTER — Encounter: Payer: Self-pay | Admitting: Internal Medicine

## 2017-04-29 ENCOUNTER — Encounter: Payer: Self-pay | Admitting: Neurology

## 2017-04-29 ENCOUNTER — Ambulatory Visit (INDEPENDENT_AMBULATORY_CARE_PROVIDER_SITE_OTHER): Payer: Medicare Other | Admitting: Neurology

## 2017-04-29 VITALS — BP 123/69 | HR 56 | Ht 73.0 in | Wt 223.0 lb

## 2017-04-29 DIAGNOSIS — G4710 Hypersomnia, unspecified: Secondary | ICD-10-CM

## 2017-04-29 DIAGNOSIS — G4733 Obstructive sleep apnea (adult) (pediatric): Secondary | ICD-10-CM

## 2017-04-29 DIAGNOSIS — Z9889 Other specified postprocedural states: Secondary | ICD-10-CM | POA: Diagnosis not present

## 2017-04-29 DIAGNOSIS — G4730 Sleep apnea, unspecified: Secondary | ICD-10-CM

## 2017-04-29 DIAGNOSIS — R0683 Snoring: Secondary | ICD-10-CM

## 2017-04-29 NOTE — Progress Notes (Signed)
SLEEP MEDICINE CLINIC   Provider:  Larey Seat, M D  Primary Care Physician:  [name], MD   Referring Provider: [name], MD    Chief Complaint  Patient presents with  . New Patient (Initial Visit)   Alone -  HPI:  Cody P Nam Vossler. is a 71 y.o. male , seen here as in a referral/ revisit  from Dr. Melford Aase for a sleep evaluation,  Miss dismisses this 71 year old Caucasian right-handed gentleman presents today upon referral of his primary care physician for evaluation of sleep apnea. Apparently the patient was diagnosed many years ago, at the [facility] heart [facility] sleep center. He was diagnosed with apnea for the first time 20 some years ago, he kept his very old ResMed CPAP but states that it has never ever helped him sleeping better and he could not tolerate various interfaces. He tried nasal cannulas, nasal and full face masks. He is claustrophobic. He would like to know if there is alternatives to CPAP available such as a dental device, but to give such recommendations we need to know what kind of apnea he has and to what degree. I also explained that comorbidities such as low nocturnal oxygen levels cannot be corrected by a dental device but only by CPAP. It may be that CPAP was not the right modality for him, and he may respond better to a BiPAP. His history of UPPP may exclude him from dental device use.    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 He is just now had hyaluronic acid injections.ENT surgery - the patient has undergone a UPPP and septoplasty. This did not cure the apnea. His wife left the marital bedroom because of his ongoing grunting , snoring and apnea. The patient was 46 at the time.   Sleep habits are as follows: Cody Hines  usual bedtime is 11 PM, he does not have trouble falling asleep, has nocturia times 2, and usually sleep through. Rising time now as a retiree in AM 7:30. He describes his bedroom is cool, quiet and dark and she sleeps alone, he prefers to sleep on his side, with many pillows. He doesn't feel that he dreams nightly and that his dreams are very vivid. When he wakes up in the morning he feels not refreshed or restored, always has a dry mouth, parched, rarely does he wake up with headaches, neither dizziness, palpitations, chest pain. He does wake up diaphoretic.   Sleep medical history and family sleep history:    Social history: 13 years retired, 9 years before rotating 12 hour shifts - night and day. He has recently separated 7 weeks ago- married 58 years. Unhappy.  He quit smoking in 1981, he drinks a couple of beers each day, usually in the evenings. He drinks one cup of coffee in the morning notable no ice tea, rare soda.   Review of Systems: Out of a complete 14 system review, the patient complains of only the following symptoms, and all other reviewed systems are negative.  He endorses snoring, diarrhea, joint pain and swelling, allergies, the tendency to bruise and bleed easily, racing thoughts, depression, anxiety, headaches but not sleep related, sleepiness and snoring again. Epworth score 15, Fatigue severity score 33  , depression score n/a    Social History   Social  History  . Marital status: Single    Spouse name: N/A  . Number of children: N/A  . Years of education: N/A   Occupational History  . Not on file.   Social History Main Topics  . Smoking status: Former Smoker    Years: 30.00    Types: Cigarettes    Quit date: 10/06/1980  . Smokeless tobacco: Never Used  . Alcohol use Yes     Comment: Beer- 12 cans per week  . Drug use: No  . Sexual activity: Not on file   Other Topics Concern  . Not on file   Social History Narrative  . No narrative on file     Family History  Problem Relation Age of Onset  . Cancer Mother        breast  . Cancer Father        lung  . Diabetes Father   . Heart disease Sister   . Arthritis Sister     Past Medical History:  Diagnosis Date  . BPH (benign prostatic hyperplasia)   . Elevated hemoglobin A1c   . Fibromyalgia   . GERD (gastroesophageal reflux disease)   . Hyperlipidemia   . Hypertension   . Hypogonadism male   . IBS (irritable bowel syndrome)   . OSA (obstructive sleep apnea)     Past Surgical History:  Procedure Laterality Date  .  nasal smr np3  1985  . KNEE ARTHROSCOPY Left 1999  . SPINE SURGERY  2007   L5 S 1 Disk  . VASECTOMY  1983    Current Outpatient Prescriptions  Medication Sig Dispense Refill  . aspirin EC 81 MG tablet Take 81 mg by mouth daily.    . CRESTOR 40 MG tablet Take 1 tablet by mouth  daily 120 tablet 1  . enalapril (VASOTEC) 20 MG tablet TAKE 1 TABLET BY MOUTH  EVERY DAY FOR BLOOD  PRESSURE 90 tablet 1  . Flaxseed, Linseed, (FLAX SEED OIL) 1000 MG CAPS Take 1 capsule by mouth 2 (two) times daily.     Marland Kitchen LORazepam (ATIVAN) 0.5 MG tablet     . Magnesium 500 MG TABS Take by mouth daily.    Marland Kitchen OVER THE COUNTER MEDICATION Patient takes Vitamin D daily and thinks it it 5000 units.    Marland Kitchen sertraline (ZOLOFT) 50 MG tablet      No current facility-administered medications for this visit.     Allergies as of 04/29/2017 - Review Complete 04/29/2017  Allergen Reaction Noted  . Asa [aspirin]  08/09/2013  . Atorvastatin  12/12/2014  . Celebrex [celecoxib]  11/08/2013  . Codeine  08/09/2013  . Penicillins  07/15/2013  . Savella [milnacipran hcl]  07/15/2013  . Viagra [sildenafil citrate] Other (See Comments) 07/15/2013    Vitals: BP 123/69   Pulse (!) 56   Ht 6\' 1"  (1.854 m)   Wt 223 lb (101.2 kg)   BMI 29.42 kg/m  Last Weight:  Wt Readings from Last 1 Encounters:  04/29/17 223 lb (101.2 kg)   WOE:HOZY mass index is 29.42 kg/m.     Last Height:   Ht  Readings from Last 1 Encounters:  04/29/17 6\' 1"  (1.854 m)    Physical exam:  General: The patient is awake, alert and appears  in distress- he is jittery, legs are restlessly moving.   The patient is well groomed. Head: Normocephalic, atraumatic. Neck is supple. Mallampati 5/ patient is unable to open jaw widely.  He reports a UPPP>  neck circumference:19.5 . Nasal airflow patent ,  TMJ click is evident . Retrognathia is not seen.  Cardiovascular:  Regular rate and rhythm, without  murmurs or carotid bruit, and without distended neck veins. Respiratory: Lungs are clear to auscultation. Skin:  Without evidence of edema, or rash Trunk: BMI is 29. The patient's posture is stooped   Neurologic exam : The patient is awake and alert, oriented to place and time.  Attention span & concentration ability appears normal.  Speech is fluent,  without  dysarthria, dysphonia or aphasia.  Mood and affect are appropriate.  Cranial nerves:  Pupils are equal and briskly reactive to light. Funduscopic exam without  evidence of pallor or edema. Extraocular movements  in vertical and horizontal planes intact and without nystagmus. Visual fields by finger perimetry are intact.Hearing to finger rub intact. Facial sensation intact to fine touch. Facial motor strength is symmetric and tongue moved midline. Shoulder shrug was symmetrical.  Motor exam:   Normal tone, muscle bulk and symmetric strength in all extremities. Sensory:  Fine touch, pinprick and vibration were tested in all extremities. Proprioception tested in the upper extremities was normal. Coordination: Rapid alternating movements in the fingers/hands was normal. Finger-to-nose maneuver  normal without evidence of ataxia, dysmetria or tremor. Gait and station: Patient walks without assistive device . Strength within normal limits Stance is stable and normal.  Tandem gait is unfragmented. Turns with 4 Steps. Romberg testing is  negative. Deep tendon  reflexes: in the  upper and lower extremities are symmetric and intact. Babinski maneuver response is  downgoing.  Assessment:  After physical and neurologic examination, review of laboratory studies,  Personal review of imaging studies, reports of other /same  Imaging studies, results of polysomnography and / or neurophysiology testing and pre-existing records as far as provided in visit., my assessment is   1)  Cody Hines carries a diagnosis of obstructive sleep apnea for 25 years or thereabouts, I have no doubt that he still has apnea based on the shape of his upper airway and the large neck and in spite of him having a body mass index below 30.   2)Since he is status post UPPP I will need to see his degree of sleep apnea in a attended sleep study and titration has to be performed and an attended environment as well. He has been failed by CPAP in the past, but he has also not had the opportunity to try one of the development of the last decade, such as ASV, air sent settings and the variety of new masks that have developed meanwhile. I understand that a UPPP patient is not a candidate for dental device but I will confirm that with Dr. Augustina Mood.  3) depression treatment - he is in psychiatric care. Recently separated , his sleep habits, diet and alcohol consume have changed.   I will asked the patient to come in for an attended sleep study as a split-night protocol. I will advise the technologist that he is claustrophobic and that we will use the smallest interface possible. Currently or last been taking CPAP he used FFM. This was after UPPP.   The patient was advised of the nature of the diagnosed disorder , the treatment options and the  risks for general health and wellness arising from not treating the condition.   I spent more than 50  minutes of face to face time with the patient.  Greater than 50% of time was spent in counseling and coordination of  care. We have discussed the diagnosis  and differential and I answered the patient's questions.    Plan:  Treatment plan and additional workup :  Attended SPLIT night study to evaluate degree of sleep apnea, hypoxemia  in a Patient with UPPP history/  Can not be auto-titrated, cannot use dental device.    Larey Seat, MD 2/83/6629, 4:76 PM  Certified in Neurology by ABPN Certified in [place] by [facility] [address] [place], [place] [address]

## 2017-05-14 ENCOUNTER — Ambulatory Visit (INDEPENDENT_AMBULATORY_CARE_PROVIDER_SITE_OTHER): Payer: Medicare Other | Admitting: Neurology

## 2017-05-14 DIAGNOSIS — G4731 Primary central sleep apnea: Principal | ICD-10-CM

## 2017-05-14 DIAGNOSIS — G4733 Obstructive sleep apnea (adult) (pediatric): Secondary | ICD-10-CM

## 2017-05-14 DIAGNOSIS — G4739 Other sleep apnea: Secondary | ICD-10-CM

## 2017-05-14 DIAGNOSIS — Z9889 Other specified postprocedural states: Secondary | ICD-10-CM

## 2017-05-14 DIAGNOSIS — R0683 Snoring: Secondary | ICD-10-CM

## 2017-05-14 DIAGNOSIS — G4710 Hypersomnia, unspecified: Secondary | ICD-10-CM

## 2017-05-14 DIAGNOSIS — G4730 Sleep apnea, unspecified: Secondary | ICD-10-CM

## 2017-05-22 DIAGNOSIS — F331 Major depressive disorder, recurrent, moderate: Secondary | ICD-10-CM | POA: Diagnosis not present

## 2017-05-25 DIAGNOSIS — F331 Major depressive disorder, recurrent, moderate: Secondary | ICD-10-CM | POA: Diagnosis not present

## 2017-05-26 NOTE — Progress Notes (Signed)
PATIENT'S NAME:  Cody Hines, Cody Hines DOB:      1945-11-16      MR#:    [id number]     DATE OF RECORDING: 05/14/2017 REFERRING M.D.:  [name], MD Study Performed:   Baseline Polysomnogram HISTORY:  Mr. Adachi has a 25 year history of being diagnosed with OSA. Status post UPPP. He endorses snoring, diarrhea, joint pain and swelling, allergies, the tendency to bruise and bleed easily, racing thoughts at night , insomnia , daytime hypersomnia, depression, anxiety, headaches but not sleep related, sleepiness and snoring.  The patient endorsed the Epworth Sleepiness Scale at 15/24  points.   The patient's weight 223 pounds with a height of 73 (inches), resulting in a BMI of 29.5 kg/m2. The patient's neck circumference measured 19 inches.  CURRENT MEDICATIONS: Aspirin, Crestor, Vasotec, Ativan, Zoloft   PROCEDURE:  This is a multichannel digital polysomnogram utilizing the SomnoStar 11.2 system.  Electrodes and sensors were applied and monitored per AASM Specifications.   EEG, EOG, Chin and Limb EMG, were sampled at 200 Hz.  ECG, Snore and Nasal Pressure, Thermal Airflow, Respiratory Effort, CPAP Flow and Pressure, Oximetry was sampled at 50 Hz. Digital video and audio were recorded.      BASELINE STUDY  Lights Out was at 22:28 and Lights On at 05:03.  Total recording time (TRT) was 395.5 minutes, with a total sleep time (TST) of 326 minutes.   The patient's sleep latency was prolonged at 52 minutes.  REM latency was very long at 206.5 minutes.  The sleep efficiency was 82.4 %.     SLEEP ARCHITECTURE: WASO (Wake after sleep onset) was 21 minutes.  There were 47 minutes in Stage N1, 116 minutes Stage N2, 81.5 minutes Stage N3 and 81.5 minutes in Stage REM.  The percentage of Stage N1 was 14.4%, Stage N2 was 35.6%, Stage N3 was 25% and Stage R (REM sleep) was 25%.   RESPIRATORY ANALYSIS:  There were a total of 40 respiratory events:  11 obstructive apneas, 0 central apneas and 29 hypopneas with 5  respiratory event related arousals (RERAs).     The total APNEA/HYPOPNEA INDEX (AHI) was 7.4 /hour and the total RESPIRATORY DISTURBANCE INDEX was 8.3 /hour.  2 events occurred in REM sleep and 54 events in NREM. The REM AHI was 1.5 /hour, versus a non-REM AHI of 9.3. The patient spent 77.5 minutes of total sleep time in the supine position and 249 minutes in non-supine. The supine AHI was 23.2 versus a non-supine AHI of 2.4.  OXYGEN SATURATION & C02:  The Wake baseline 02 saturation was 96%, with the lowest being 81%. Time spent below 89% saturation equaled 6 minutes.   PERIODIC LIMB MOVEMENTS:  The patient had a total of 232 Periodic Limb Movements.  The Periodic Limb Movement (PLM) index was 42.7 and the PLM Arousal index was 2.8/hour. The arousals were noted as: 37 were spontaneous, 15 were associated with PLMs, and 29 were associated with respiratory events.      Severe Snoring was noted, indicating UARS ( upper airway resistance syndrome). EKG with noted PVCs but mainly normal sinus rhythm (NSR).  Audio and video analysis did not show any abnormal or unusual movements, behaviors, phonations or vocalizations. PLMs were moderate- severe and caused additional arousals.  PLMs were not seen during REM sleep. The patient took one bathroom break.    Post-study, the patient indicated that sleep was the same as usual.    IMPRESSION:  1. Mild Obstructive Sleep Apnea (OSA)  with UARS, but not prolonged hypoxemia.  2. 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   RECOMMENDATIONS:  1. Advise full-night, attended, CPAP titration study to optimize therapy.  Patients with UPPP are not currently candidates for mandibular advancement therapy.  2. The patient will benefit from positional therapy (see supine AHI). 3. Further information regarding OSA may be obtained from [employer] (www.sleepfoundation.org) or American Sleep Apnea Association  (www.sleepapnea.org). 4. Consider dedicated sleep psychology referral if insomnia is of clinical concern.   5. A follow up appointment will be scheduled in the Sleep Clinic at [facility]. The referring provider will be notified of the results.      I certify that I have reviewed the entire raw data recording prior to the issuance of this report in accordance with the Standards of Accreditation of the American Academy of Sleep Medicine (AASM)    Larey Seat, MD   05-26-2017 Diplomat, American Board of Psychiatry and Neurology  Diplomat, American Board of [place] Director, [place] [facility] at [employer]

## 2017-05-26 NOTE — Addendum Note (Signed)
Addended by: Larey Seat on: 05/26/2017 12:46 PM   Modules accepted: Orders

## 2017-05-26 NOTE — Procedures (Deleted)
PATIENT'S NAME:  Cody Hines DOB:      09/22/1942      MR#:    [id number]     DATE OF RECORDING: 05/15/2017 REFERRING M.D.:  Estrella Deeds, MD Study Performed:   CPAP  Titration in patient status post UPPP HISTORY:  This 71 year old male patient of Dr Clydene Fake and Dr. Tamala Julian' is returning for CPAP re-titration, having been diagnosed with OSA 6 years ago; currently on CPAP 15 cm at home, using a full face mask. He has  had another TIA on 01-19-2016, remains anticoagulated, uses CPAP  at 15 cm water with high residual AHI of 11.3/hr. He suffered strokes in 12-28-2010 ( thalamic) , and 01-30-2015. Followed by Dr. Leonie Man in the [facility] trial.   The patient endorsed the Epworth Sleepiness Scale at 9 points.  The patient's weight 273 pounds with a height of 70 (inches), resulting in a BMI of 39.1 kg/m2. The patient's neck circumference measured 18.5 inches.  CURRENT MEDICATIONS: Tylenol, Zyloprim, Norvasc, Synthroid, Cozaar, Toprol, Protonix, Proair, Zoloft, Ultram, Dyazide, Crestor    PROCEDURE:  This is a multichannel digital polysomnogram utilizing the SomnoStar 11.2 system.  Electrodes and sensors were applied and monitored per AASM Specifications.   EEG, EOG, Chin and Limb EMG, were sampled at 200 Hz.  ECG, Snore and Nasal Pressure, Thermal Airflow, Respiratory Effort, CPAP Flow and Pressure, Oximetry was sampled at 50 Hz. Digital video and audio were recorded.       CPAP was initiated at 5 cmH20 with heated humidity per AASM split night standards and pressure was advanced to 17cmH20 because of hypopneas, apneas and desaturations. Unfortunatly, the AHI rose with higher CPAP pressure. Best results were seen at 12 cm water.  He was switched to BIPAP, at a pressure of 19/15 cmH20, there was no reduction of the AHI. Finally, the  pressure was reduced to 14/11/0/4  with 4 Cm pressure support, with improvement of sleep apnea.  AHI of 0.0.  Lights Out was at 00:04 and Lights On at 08:10. Total recording time  (TRT) was 487 minutes, with a total sleep time (TST) of 232.5 minutes. The patient's sleep latency was 105.5 minutes with 80 minutes of wake time after sleep onset. REM latency was 79 minutes.  The sleep efficiency was 47.7 %.    SLEEP ARCHITECTURE: WASO (Wake after sleep onset)  was 91.5 minutes.  There were 102 minutes in Stage N1, 73 minutes Stage N2, 16 minutes Stage N3 and 41.5 minutes in Stage REM.  The percentage of Stage N1 was 43.9%, Stage N2 was 31.4%, Stage N3 was 6.9% and Stage R (REM sleep) was 17.8%.    RESPIRATORY ANALYSIS:  There was a total of 104 respiratory events: 0 obstructive apneas, 0 central apneas and 0 mixed apneas with a total of 0 apneas and an apnea index (AI) of 0 /hour. There were 104 hypopneas with a hypopnea index of 26.8/hour. The patient also had 0 respiratory event related arousals (RERAs).      The total APNEA/HYPOPNEA INDEX  (AHI) was 26.8 /hour and the total RESPIRATORY DISTURBANCE INDEX was 26.8 .hour  0 events occurred in REM sleep and 104 events in NREM. The REM AHI was 0 /hour versus a non-REM AHI of 32.7 /hour.  The patient spent 101 minutes of total sleep time in the supine position and 132 minutes in non-supine. The supine AHI was 61.8, versus a non-supine AHI of 0.0.  OXYGEN SATURATION & C02:  The baseline 02 saturation was 94%,  with the lowest being 71%. Time spent below 89% saturation equaled 38 minutes.  PERIODIC LIMB MOVEMENTS:   The patient had a total of 0 Periodic Limb Movements. The arousals were noted as: 26 were spontaneous, 0 were associated with PLMs, and 65 were associated with respiratory events.  Audio and video analysis did not show any abnormal or unusual movements, behaviors, phonations or vocalizations.  NO bathroom breaks.  Snoring was noted. EKG was in normal sinus rhythm (NSR). Frequent PVCs.   DIAGNOSIS 1. 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 2. Patient did not  respond to BiPAP and tolerated ASV poorly but better. 3.  His AHI was best at ASV 14 over 11cm with 4 cm minimum pressure support.      PLANS/RECOMMENDATIONS: I recommend a month of ASV trial for this experienced CPAP patient.  Settings as above - The patient was fitted with a FFM, ResMed  AirTouch large size interface.  A follow up appointment will be scheduled in the Sleep Clinic at [facility].   Please call [phone] with any questions.      I certify that I have reviewed the entire raw data recording prior to the issuance of this report in accordance with the Standards of Accreditation of the American Academy of Sleep Medicine (AASM)    Larey Seat, M.D.  05-26-2017  Diplomat, American Board of Psychiatry and Neurology  Diplomat, [place] of Sleep Medicine Medical Director, [place] [facility] at [facility]

## 2017-05-29 ENCOUNTER — Telehealth: Payer: Self-pay | Admitting: Neurology

## 2017-05-29 NOTE — Telephone Encounter (Signed)
Called to discuss sleep study results. Pt didn't answer. LVM for pt to call back on Monday.

## 2017-06-01 DIAGNOSIS — F331 Major depressive disorder, recurrent, moderate: Secondary | ICD-10-CM | POA: Diagnosis not present

## 2017-06-01 NOTE — Telephone Encounter (Signed)
Patient returning a call regarding sleep results. °

## 2017-06-02 ENCOUNTER — Other Ambulatory Visit: Payer: Self-pay | Admitting: Neurology

## 2017-06-02 ENCOUNTER — Telehealth: Payer: Self-pay | Admitting: Neurology

## 2017-06-02 DIAGNOSIS — G4733 Obstructive sleep apnea (adult) (pediatric): Secondary | ICD-10-CM

## 2017-06-02 NOTE — Telephone Encounter (Signed)
The second study report posted is the correct one. I informed dr Melford Aase about the second report being valid and had tried to remove the wrong report from the chart.

## 2017-06-02 NOTE — Telephone Encounter (Signed)
Called to speak with the patient to discuss sleep study results. Pt had just recently spoken with Erasmo Downer but there was some confusion about his sleep study. Pt does need to come in for a sleep titration study. No answer at this time. No VM available to tell pt to call back.

## 2017-06-02 NOTE — Telephone Encounter (Signed)
I called pt back. It appears that Dr. Brett Fairy read his sleep study results but one sleep study version reports that pt needs a cpap titration and the other sleep study version says that pt needs an ASV trial for one month. Pt says that he has already been on cpap and uses [facility]. I advised him that we will send the order for his ASV to [facility] and a follow up appt was made for 09/01/17 at 3:00pm.  I will send to Dr. Brett Fairy and Myriam Jacobson, RN for clarification. Please make sure that the pt should indeed start an ASV and not return for a cpap titration.

## 2017-06-04 NOTE — Telephone Encounter (Signed)
Attempted to call patient again to go over sleep results and make him aware that he does need to come in for a cpap titration study. No answer at this time. Explained over the answering machine but LVM stating if pt has questions to call.

## 2017-06-09 DIAGNOSIS — F331 Major depressive disorder, recurrent, moderate: Secondary | ICD-10-CM | POA: Diagnosis not present

## 2017-06-10 ENCOUNTER — Encounter: Payer: Self-pay | Admitting: Neurology

## 2017-06-15 DIAGNOSIS — F331 Major depressive disorder, recurrent, moderate: Secondary | ICD-10-CM | POA: Diagnosis not present

## 2017-06-16 NOTE — Telephone Encounter (Signed)
Called patient and made the patient aware of the sleep study results and that Dr Brett Fairy is wanting to bring him back in for a CPAP titration study. Pt verbalized understanding. Pt will be on look out for the sleep lab to call him

## 2017-06-16 NOTE — Telephone Encounter (Signed)
Pt has called back and is asking for a call back on mobile@ [phone]

## 2017-06-22 DIAGNOSIS — F331 Major depressive disorder, recurrent, moderate: Secondary | ICD-10-CM | POA: Diagnosis not present

## 2017-06-29 ENCOUNTER — Ambulatory Visit (INDEPENDENT_AMBULATORY_CARE_PROVIDER_SITE_OTHER): Payer: Medicare Other | Admitting: Neurology

## 2017-06-29 DIAGNOSIS — G4761 Periodic limb movement disorder: Secondary | ICD-10-CM

## 2017-06-29 DIAGNOSIS — M791 Myalgia, unspecified site: Secondary | ICD-10-CM

## 2017-06-29 DIAGNOSIS — G4733 Obstructive sleep apnea (adult) (pediatric): Secondary | ICD-10-CM | POA: Diagnosis not present

## 2017-06-29 DIAGNOSIS — G4719 Other hypersomnia: Secondary | ICD-10-CM

## 2017-07-01 NOTE — Procedures (Signed)
PATIENT'S NAME:  Cody Hines, Cody Hines DOB:      [birth year]      MR#:    [id number]     DATE OF RECORDING: 06/29/2017 REFERRING M.D.:  [name], MD Study Performed:   CPAP Titration HISTORY:     This 71 year old male patient had a baseline sleep study with [place] on 05/14/17, resulting in an [place] (AHI) of 7.4 /hour -the total RESPIRATORY DISTURBANCE INDEX was 8.3 /hr.   The REM AHI was 1.5 /hour, versus a non-REM AHI of 9.3. The patient spent 77.5 minutes of total sleep time in the supine position and 249 minutes in non-supine. The supine AHI was 23.2 versus a non-supine AHI of 2.4.  Sp02 saturation lowest being 81%, without prolonged hypoxemia. High degree of daytime somnolence.   Mr. Cody Hines has a 25 year history of being diagnosed with OSA. Status post UPPP. He endorses snoring, diarrhea, joint pain and swelling, allergies, tendency to bruise easily, racing thoughts at night, insomnia, daytime hypersomnia, depression, anxiety, headaches - not sleep related.   The patient endorsed the Epworth Sleepiness Scale at 15/24 points.   The patient's weight 223 pounds with a height of 73 (inches), resulting in a BMI of 29.5 kg/m2. The patient's neck circumference measured 19 inches.  CURRENT MEDICATIONS: Aspirin, Crestor, Vasotec, Ativan, Zoloft    PROCEDURE:  This is a multichannel digital polysomnogram utilizing the SomnoStar 11.2 system.  Electrodes and sensors were applied and monitored per AASM Specifications.   EEG, EOG, Chin and Limb EMG, were sampled at 200 Hz.  ECG, Snore and Nasal Pressure, Thermal Airflow, Respiratory Effort, CPAP Flow and Pressure, Oximetry was sampled at 50 Hz. Digital video and audio were recorded.       CPAP was initiated at 5 cmH20 with heated humidity per AASM split night standards and pressure was advanced to 11/11cmH20 because of hypopneas, apneas and desaturations.  At a PAP pressure of 11 cmH20, there was a reduction of the AHI to 0.0/hr. for 131 minutes of  sleep time, SpO2 nadir 93% with improvement of obstructive sleep apnea.    Lights Out was at 22:46 and Lights On at 05:11. Total recording time (TRT) was 386 minutes, with a total sleep time (TST) of 237 minutes. The patient's sleep latency was 108 minutes with 0 minutes of wake time after sleep onset. REM latency was 0 minutes.  The sleep efficiency was 61.4 %.    SLEEP ARCHITECTURE: WASO (Wake after sleep onset) was 41 minutes.  There were 4 minutes in Stage N1, 175.5 minutes Stage N2, 57.5 minutes Stage N3 and 0 minutes in Stage REM.  The percentage of Stage N1 was 1.7%, Stage N2 was 74.1%, Stage N3 was 24.3% and Stage R (REM sleep) was 0%.    RESPIRATORY ANALYSIS:  There was a total of 23 respiratory events: 11 obstructive apneas, 0 central apneas and 0 mixed apneas with a total of 11 apneas and an apnea index (AI) of 2.8 /hour. There were 12 hypopneas with a hypopnea index of 3./hour. The patient also had 0 respiratory event related arousals (RERAs).    The total APNEA/HYPOPNEA INDEX  (AHI) was 5.8 /hour and the total RESPIRATORY DISTURBANCE INDEX was 5.8 .hour  0 events occurred in REM sleep and 23 events in NREM. The REM AHI was 0.0 /hour versus a non-REM AHI of 5.8 /hour.  The patient spent 94 minutes of total sleep time in the supine position and 143 minutes in non-supine. The supine AHI  was 4.5, versus a non-supine AHI of 6.7.  OXYGEN SATURATION & C02:  The baseline 02 saturation was 96%, with the lowest being 90%. Time spent below 89% saturation equaled 0 minutes.  PERIODIC LIMB MOVEMENTS:    The patient had a total of 408 Periodic Limb Movements. The Periodic Limb Movement (PLM) index was 103.3 and the PLM Arousal index was 8.9 /hour. The arousals were noted as: 5 were spontaneous, 35 were associated with PLMs, and 23 were associated with respiratory events.  Audio and video analysis did not show any abnormal or unusual movements, behaviors, phonations or vocalizations. Severe PLMs at any  CPAP pressure, PLMs were seen in his baseline PSG, too. Marland Kitchen   EKG was in keeping with normal sinus rhythm (NSR).  Post-study, the patient indicated that sleep was the same as usual.  DIAGNOSIS 1. Obstructive Sleep Apnea, with good response to CPAP at 11 cm with 2 cm EPR. The patient was fitted with a ResMed AirFit P10 with medium pillows , requested not to use FFM.  2. Upper Airway Resistance Syndrome (UARS) responded to CPAP, status post UPPP. 3. Severe Periodic Limb Movement Disorder    PLANS/RECOMMENDATIONS: 1. Follow-up with referring physician. a. CPAP clinic follow up in 2-3 months after receiving device; and thereafter, yearly CPAP clinic follow-up is advisable. b. Avoidance of ingestion of alcohol prior to sleeping. c. Avoiding sleeping in the supine position (on one's back). d. Improvement of nasal patency if indicated. e. Avoiding driving when sleepy.   DISCUSSION: I like for Mr. Nakama to re-instate CPAP with a nasal pillow interface. His apnea is very mild, his sleepiness score is high and OSA may not be his main sleep problem any longer. Severe PLMs were noted with and without CPAP use - PLMD will be evaluated in RV.  A follow up appointment will be scheduled in the Sleep Clinic at [facility].   Please call [phone] with any questions.      I certify that I have reviewed the entire raw data recording prior to the issuance of this report in accordance with the Standards of Accreditation of the American Academy of Sleep Medicine (AASM)      Larey Seat, M.D.       07-01-2017  Diplomat, American Board of Psychiatry and Neurology  Diplomat, [place] of Sleep Medicine Medical Director, [place] [facility] at [facility]

## 2017-07-01 NOTE — Addendum Note (Signed)
Addended by: Larey Seat on: 07/01/2017 05:42 PM   Modules accepted: Orders

## 2017-07-02 ENCOUNTER — Telehealth: Payer: Self-pay | Admitting: Neurology

## 2017-07-02 NOTE — Telephone Encounter (Signed)
Pt returned call. I advised pt that Dr. Brett Fairy reviewed their sleep study results and found that pt OSA. Dr. Brett Fairy recommends that pt starts CPAP. I reviewed PAP compliance expectations with the pt. Pt is agreeable to starting a CPAP. I advised pt that an order will be sent to a DME, [facility], and [facility] will call the pt within about one week after they file with the pt's insurance. [facility] will show the pt how to use the machine, fit for masks, and troubleshoot the CPAP if needed. A follow up appt was made for insurance purposes with Cecille Rubin on 09/18/17 at 9:30 am. Pt verbalized understanding to arrive 15 minutes early and bring their CPAP. A letter with all of this information in it will be mailed to the pt as a reminder. I verified with the pt that the address we have on file is correct. Pt verbalized understanding of results. Pt had no questions at this time but was encouraged to call back if questions arise.

## 2017-07-02 NOTE — Telephone Encounter (Signed)
Patient called office returning RN's call.  Patient is at work and will call back when he is able to.

## 2017-07-02 NOTE — Telephone Encounter (Signed)
-----  Message from Larey Seat, MD sent at 07/01/2017  5:40 PM EDT ----- MR [place], BUT SEVERE PLMS, THESE PLmovements  DID NOT CHANGE FOR THE BETTER UNDER CPAP.HE HAS BEEN SLEEPY IN DAYTIME, HOPEFULLY THIS SYMPTOM WILL RESPOND TO CPAP . Will met and discuss his leg movements further

## 2017-07-02 NOTE — Telephone Encounter (Signed)
Called the patient with sleep study results. Pt is at work and has asked to call me back. Will await his call

## 2017-07-06 DIAGNOSIS — F331 Major depressive disorder, recurrent, moderate: Secondary | ICD-10-CM | POA: Diagnosis not present

## 2017-07-13 DIAGNOSIS — F331 Major depressive disorder, recurrent, moderate: Secondary | ICD-10-CM | POA: Diagnosis not present

## 2017-07-14 DIAGNOSIS — F331 Major depressive disorder, recurrent, moderate: Secondary | ICD-10-CM | POA: Diagnosis not present

## 2017-07-20 DIAGNOSIS — F331 Major depressive disorder, recurrent, moderate: Secondary | ICD-10-CM | POA: Diagnosis not present

## 2017-07-27 DIAGNOSIS — F331 Major depressive disorder, recurrent, moderate: Secondary | ICD-10-CM | POA: Diagnosis not present

## 2017-08-03 DIAGNOSIS — F331 Major depressive disorder, recurrent, moderate: Secondary | ICD-10-CM | POA: Diagnosis not present

## 2017-08-10 DIAGNOSIS — F331 Major depressive disorder, recurrent, moderate: Secondary | ICD-10-CM | POA: Diagnosis not present

## 2017-08-14 ENCOUNTER — Encounter: Payer: Self-pay | Admitting: Physician Assistant

## 2017-08-14 ENCOUNTER — Ambulatory Visit (INDEPENDENT_AMBULATORY_CARE_PROVIDER_SITE_OTHER): Payer: Medicare Other | Admitting: Physician Assistant

## 2017-08-14 VITALS — BP 128/72 | HR 67 | Temp 97.7°F | Resp 18 | Ht 73.0 in | Wt 225.6 lb

## 2017-08-14 DIAGNOSIS — G4733 Obstructive sleep apnea (adult) (pediatric): Secondary | ICD-10-CM

## 2017-08-14 DIAGNOSIS — F339 Major depressive disorder, recurrent, unspecified: Secondary | ICD-10-CM

## 2017-08-14 DIAGNOSIS — R6889 Other general symptoms and signs: Secondary | ICD-10-CM | POA: Diagnosis not present

## 2017-08-14 DIAGNOSIS — K589 Irritable bowel syndrome without diarrhea: Secondary | ICD-10-CM

## 2017-08-14 DIAGNOSIS — I10 Essential (primary) hypertension: Secondary | ICD-10-CM

## 2017-08-14 DIAGNOSIS — Z79899 Other long term (current) drug therapy: Secondary | ICD-10-CM

## 2017-08-14 DIAGNOSIS — R350 Frequency of micturition: Secondary | ICD-10-CM

## 2017-08-14 DIAGNOSIS — E349 Endocrine disorder, unspecified: Secondary | ICD-10-CM

## 2017-08-14 DIAGNOSIS — G250 Essential tremor: Secondary | ICD-10-CM | POA: Diagnosis not present

## 2017-08-14 DIAGNOSIS — R7303 Prediabetes: Secondary | ICD-10-CM | POA: Diagnosis not present

## 2017-08-14 DIAGNOSIS — Z0001 Encounter for general adult medical examination with abnormal findings: Secondary | ICD-10-CM

## 2017-08-14 DIAGNOSIS — Z6829 Body mass index (BMI) 29.0-29.9, adult: Secondary | ICD-10-CM

## 2017-08-14 DIAGNOSIS — Z9989 Dependence on other enabling machines and devices: Secondary | ICD-10-CM

## 2017-08-14 DIAGNOSIS — N401 Enlarged prostate with lower urinary tract symptoms: Secondary | ICD-10-CM | POA: Diagnosis not present

## 2017-08-14 DIAGNOSIS — K219 Gastro-esophageal reflux disease without esophagitis: Secondary | ICD-10-CM

## 2017-08-14 DIAGNOSIS — M797 Fibromyalgia: Secondary | ICD-10-CM | POA: Diagnosis not present

## 2017-08-14 DIAGNOSIS — E559 Vitamin D deficiency, unspecified: Secondary | ICD-10-CM | POA: Diagnosis not present

## 2017-08-14 DIAGNOSIS — E782 Mixed hyperlipidemia: Secondary | ICD-10-CM

## 2017-08-14 DIAGNOSIS — Z0000 Encounter for general adult medical examination without abnormal findings: Secondary | ICD-10-CM

## 2017-08-14 LAB — HEPATIC FUNCTION PANEL
AG Ratio: 2 (calc) (ref 1.0–2.5)
ALBUMIN MSPROF: 4.5 g/dL (ref 3.6–5.1)
ALT: 20 U/L (ref 9–46)
AST: 20 U/L (ref 10–35)
Alkaline phosphatase (APISO): 41 U/L (ref 40–115)
BILIRUBIN INDIRECT: 0.5 mg/dL (ref 0.2–1.2)
Bilirubin, Direct: 0.1 mg/dL (ref 0.0–0.2)
Globulin: 2.3 g/dL (calc) (ref 1.9–3.7)
Total Bilirubin: 0.6 mg/dL (ref 0.2–1.2)
Total Protein: 6.8 g/dL (ref 6.1–8.1)

## 2017-08-14 LAB — BASIC METABOLIC PANEL WITH GFR
BUN / CREAT RATIO: 8 (calc) (ref 6–22)
BUN: 13 mg/dL (ref 7–25)
CALCIUM: 9.8 mg/dL (ref 8.6–10.3)
CHLORIDE: 104 mmol/L (ref 98–110)
CO2: 25 mmol/L (ref 20–32)
Creat: 1.58 mg/dL — ABNORMAL HIGH (ref 0.70–1.25)
GFR, EST AFRICAN AMERICAN: 51 mL/min/{1.73_m2} — AB (ref >=60)
GFR, Est Non African American: 44 mL/min/{1.73_m2} — ABNORMAL LOW (ref >=60)
GLUCOSE: 135 mg/dL — AB (ref 65–99)
POTASSIUM: 4.2 mmol/L (ref 3.5–5.3)
Sodium: 139 mmol/L (ref 135–146)

## 2017-08-14 LAB — TSH: TSH: 2.17 mIU/L (ref 0.40–4.50)

## 2017-08-14 LAB — LIPID PANEL
CHOL/HDL RATIO: 3.7 (calc) (ref <5.0)
Cholesterol: 167 mg/dL (ref <200)
HDL: 45 mg/dL (ref >40)
LDL CHOLESTEROL (CALC): 88 mg/dL
NON-HDL CHOLESTEROL (CALC): 122 mg/dL (ref <130)
Triglycerides: 259 mg/dL — ABNORMAL HIGH (ref <150)

## 2017-08-14 LAB — CBC WITH DIFFERENTIAL/PLATELET
Basophils Absolute: 29 cells/uL (ref 0–200)
Basophils Relative: 0.4 %
Eosinophils Absolute: 219 cells/uL (ref 15–500)
Eosinophils Relative: 3 %
HCT: 43.5 % (ref 38.5–50.0)
Hemoglobin: 14.8 g/dL (ref 13.2–17.1)
Lymphs Abs: 2665 cells/uL (ref 850–3900)
MCH: 30.2 pg (ref 27.0–33.0)
MCHC: 34 g/dL (ref 32.0–36.0)
MCV: 88.8 fL (ref 80.0–100.0)
MONOS PCT: 8.7 %
MPV: 11.1 fL (ref 7.5–12.5)
NEUTROS PCT: 51.4 %
Neutro Abs: 3752 cells/uL (ref 1500–7800)
PLATELETS: 210 10*3/uL (ref 140–400)
RBC: 4.9 10*6/uL (ref 4.20–5.80)
RDW: 12.9 % (ref 11.0–15.0)
TOTAL LYMPHOCYTE: 36.5 %
WBC: 7.3 10*3/uL (ref 3.8–10.8)
WBCMIX: 635 {cells}/uL (ref 200–950)

## 2017-08-14 LAB — MAGNESIUM: MAGNESIUM: 2.2 mg/dL (ref 1.5–2.5)

## 2017-08-14 MED ORDER — PREDNISONE 20 MG PO TABS: ORAL_TABLET | ORAL | 0 refills | Status: DC

## 2017-08-14 MED ORDER — AZITHROMYCIN 250 MG PO TABS: ORAL_TABLET | ORAL | 1 refills | Status: DC

## 2017-08-14 NOTE — Patient Instructions (Addendum)
Bring in medications next time so we can see what psych meds you are on  HOW TO TREAT VIRAL COUGH AND COLD SYMPTOMS:  -Symptoms usually last at least 1 week with the worst symptoms being around day 4.  - colds usually start with a sore throat and end with a cough, and the cough can take 2 weeks to get better.  -No antibiotics are needed for colds, flu, sore throats, cough, bronchitis UNLESS symptoms are longer than 7 days OR if you are getting better then get drastically worse.  -There are a lot of combination medications (Dayquil, Nyquil, Vicks 44, tyelnol cold and sinus, ETC). Please look at the ingredients on the back so that you are treating the correct symptoms and not doubling up on medications/ingredients.    Medicines you can use  Nasal congestion  - pseudoephedrine (Sudafed)- behind the counter, do not use if you have high blood pressure, medicine that have -D in them.  - phenylephrine (Sudafed PE) -Dextormethorphan + chlorpheniramine (Coridcidin HBP)- okay if you have high blood pressure -Oxymetazoline (Afrin) nasal spray- LIMIT to 3 days -Saline nasal spray -Neti pot (used distilled or bottled water)  Ear pain/congestion  -pseudoephedrine (sudafed) - Nasonex/flonase nasal spray  Fever  -Acetaminophen (Tyelnol) -Ibuprofen (Advil, motrin, aleve)  Sore Throat  -Acetaminophen (Tyelnol) -Ibuprofen (Advil, motrin, aleve) -Drink a lot of water -Gargle with salt water - Rest your voice (don't talk) -Throat sprays -Cough drops  Body Aches  -Acetaminophen (Tyelnol) -Ibuprofen (Advil, motrin, aleve)  Headache  -Acetaminophen (Tyelnol) -Ibuprofen (Advil, motrin, aleve) - Exedrin, Exedrin Migraine  Allergy symptoms (cough, sneeze, runny nose, itchy eyes) -Claritin or loratadine cheapest but likely the weakest  -Zyrtec or certizine at night because it can make you sleepy -The strongest is allegra or fexafinadine  Cheapest at [employer], [employer],  [employer]  Cough  -Dextromethorphan (Delsym)- medicine that has DM in it -Guafenesin (Mucinex/Robitussin) - cough drops - drink lots of water  Chest Congestion  -Guafenesin (Mucinex/Robitussin)  Red Itchy Eyes  - Naphcon-A  Upset Stomach  - Bland diet (nothing spicy, greasy, fried, and high acid foods like tomatoes, oranges, berries) -OKAY- cereal, bread, soup, crackers, rice -Eat smaller more frequent meals -reduce caffeine, no alcohol -Loperamide (Imodium-AD) if diarrhea -Prevacid for heart burn  General health when sick  -Hydration -wash your hands frequently -keep surfaces clean -change pillow cases and sheets often -Get fresh air but do not exercise strenuously -Vitamin D, double up on it - Vitamin C -Zinc        Major Depressive Disorder, Adult Major depressive disorder (MDD) is a mental health condition. MDD often makes you feel sad, hopeless, or helpless. MDD can also cause symptoms in your body. MDD can affect your:  Work.  School.  Relationships.  Other normal activities.  MDD can range from mild to very bad. It may occur once (single episode MDD). It can also occur many times (recurrent MDD). The main symptoms of MDD often include:  Feeling sad, depressed, or irritable most of the time.  Loss of interest.  MDD symptoms also include:  Sleeping too much or too little.  Eating too much or too little.  A change in your weight.  Feeling tired (fatigue) or having low energy.  Feeling worthless.  Feeling guilty.  Trouble making decisions.  Trouble thinking clearly.  Thoughts of suicide or harming others.  Feeling weak.  Feeling agitated.  Keeping yourself from being around other people (isolation).  Follow these instructions at home: Activity  Do  these things as told by your doctor: ? Go back to your normal activities. ? Exercise regularly. ? Spend time outdoors. Alcohol  Talk with your doctor about how alcohol can affect  your antidepressant medicines.  Do not drink alcohol. Or, limit how much alcohol you drink. ? This means no more than 1 drink a day for nonpregnant women and 2 drinks a day for men. One drink equals one of these:  12 oz of beer.  5 oz of wine.  1 oz of hard liquor. General instructions  Take over-the-counter and prescription medicines only as told by your doctor.  Eat a healthy diet.  Get plenty of sleep.  Find activities that you enjoy. Make time to do them.  Think about joining a support group. Your doctor may be able to suggest a group for you.  Keep all follow-up visits as told by your doctor. This is important. Where to find more information:  [employer] on Mental Illness: ? www.nami.[place]: ? https://carter.com/  National Suicide Prevention Lifeline: ? (559)569-4962. This is free, 24-hour help. Contact a doctor if:  Your symptoms get worse.  You have new symptoms. Get help right away if:  You self-harm.  You see, hear, taste, smell, or feel things that are not present (hallucinate). If you ever feel like you may hurt yourself or others, or have thoughts about taking your own life, get help right away. You can go to your nearest emergency department or call:  Your local emergency services (911 in the [place]  A suicide crisis helpline, such as the National Suicide Prevention Lifeline: ? (915) 795-7941. This is open 24 hours a day.  This information is not intended to replace advice given to you by your health care provider. Make sure you discuss any questions you have with your health care provider. Document Released: 09/03/2015 Document Revised: 06/08/2016 Document Reviewed: 06/08/2016 Elsevier Interactive Patient Education  2017 [employer].

## 2017-08-14 NOTE — Progress Notes (Signed)
MEDICARE ANNUAL WELLNESS VISIT AND FOLLOW UP Assessment:    Essential hypertension - continue medications, DASH diet, exercise and monitor at home. Call if greater than 130/80.  -     CBC with Differential/Platelet -     BASIC METABOLIC PANEL WITH GFR -     Hepatic function panel -     TSH  Hyperlipidemia -continue medications, check lipids, decrease fatty foods, increase activity.  -     Lipid panel  Prediabetes Discussed general issues about diabetes pathophysiology and management., Educational material distributed., Suggested low cholesterol diet., Encouraged aerobic exercise., Discussed foot care., Reminded to get yearly retinal exam. -     Hemoglobin A1c  Depression, major, in active ([place]) Continue follow up, bring in meds next OV so we can know what you are on/update our records  OSA on CPAP Weight loss advised, continue CPAP  Gastroesophageal reflux disease, esophagitis presence not specified Continue PPI/H2 blocker, diet discussed  IBS (irritable bowel syndrome) Diet controlled  Fibromyalgia Increase exercise, healthy eating  BPH (benign prostatic hyperplasia) Continue to monitor  Testosterone deficiency Weight loss advised  Vitamin D deficiency Continue supplement  Medication management -     Magnesium  Encounter for Medicare annual wellness exam   Over 30 minutes of exam, counseling, chart review, and critical decision making was performed  Future Appointments  Date Time Provider [place]  09/01/2017  3:00 PM Dohmeier, Asencion Partridge, MD [facility] None  09/18/2017  9:30 AM Dennie Bible, NP [facility] None  11/20/2017 11:00 AM [name], MD [facility] None     Plan:   During the course of the visit the patient was educated and counseled about appropriate screening and preventive services including:    Pneumococcal vaccine   Influenza vaccine  Prevnar 13  Td vaccine  Screening electrocardiogram  Colorectal cancer  screening  Diabetes screening  Glaucoma screening  Nutrition counseling    Subjective:  Cody Hines. is a 71 y.o. male who presents for Medicare Annual Wellness Visit and 3 month follow up for HTN, hyperlipidemia, prediabetes, and vitamin D Def.   He is seeing Dr. Charlott Holler at [facility] and a therapist this as well, he is on a medication other than the zoloft, states it was recently increased and his depression is somewhat better. Wife and him are separated, x 5 months. Daughter is psychologist.   Started on new CPAP in Sept and states that it is helping.   Had left knee injection in June and it has been helping.   His blood pressure has been controlled at home, today their BP is BP: 128/72  He does not workout. He denies chest pain, shortness of breath, dizziness.  He is on cholesterol medication and denies myalgias. His cholesterol is at goal. The cholesterol last visit was:   Lab Results  Component Value Date   CHOL 140 04/24/2017   HDL 54 04/24/2017   LDLCALC 53 04/24/2017   TRIG 164 (H) 04/24/2017   CHOLHDL 2.6 04/24/2017    He has been working on diet and exercise for prediabetes, and denies paresthesia of the feet, polydipsia, polyuria and visual disturbances. Last A1C in the office was:  Lab Results  Component Value Date   HGBA1C 5.5 04/24/2017   Last GFR Lab Results  Component Value Date   GFRNONAA 46 (L) 04/24/2017   Patient is on Vitamin D supplement.   Lab Results  Component Value Date   VD25OH 60 04/24/2017     BMI is Body mass  index is 29.76 kg/m., he is working on diet and exercise. Wt Readings from Last 3 Encounters:  08/14/17 225 lb 9.6 oz (102.3 kg)  04/29/17 223 lb (101.2 kg)  04/24/17 222 lb 3.2 oz (100.8 kg)    Medication Review: Current Outpatient Medications on File Prior to Visit  Medication Sig Dispense Refill  . aspirin EC 81 MG tablet Take 81 mg by mouth daily.    . CRESTOR 40 MG tablet Take 1 tablet by mouth  daily 120 tablet 1   . enalapril (VASOTEC) 20 MG tablet TAKE 1 TABLET BY MOUTH  EVERY DAY FOR BLOOD  PRESSURE 90 tablet 1  . Flaxseed, Linseed, (FLAX SEED OIL) 1000 MG CAPS Take 1 capsule by mouth 2 (two) times daily.     Marland Kitchen LORazepam (ATIVAN) 0.5 MG tablet     . Magnesium 500 MG TABS Take by mouth daily.    Marland Kitchen OVER THE COUNTER MEDICATION Patient takes Vitamin D daily and thinks it it 5000 units.    Marland Kitchen sertraline (ZOLOFT) 50 MG tablet      No current facility-administered medications on file prior to visit.     Allergies: Allergies  Allergen Reactions  . Asa [Aspirin]   . Atorvastatin     Muscle aches  . Celebrex [Celecoxib]   . Codeine   . Penicillins   . Savella [Milnacipran Hcl]   . Viagra [Sildenafil Citrate] Other (See Comments)    Headache    Current Problems (verified) has Hypertension; Hyperlipidemia; GERD (gastroesophageal reflux disease); Prediabetes; IBS (irritable bowel syndrome); OSA on CPAP; Fibromyalgia; BPH (benign prostatic hyperplasia); Testosterone deficiency; Medication management; Vitamin D deficiency; Depression, major, in remission ([place]); Encounter for Medicare annual wellness exam; Plantar fasciitis of left foot; and Benign essential tremor on their problem list.  Screening Tests Immunization History  Administered Date(s) Administered  . Pneumococcal-Unspecified 07/16/2011  . Tdap 01/13/2010  . Zoster 08/09/2013   Preventative care: Last colonoscopy: 2013 per patient due 10 years will get paper chart to try to find CXR 2009 CT AB 2009 Sleep study 06/2017  Prior vaccinations: TD or Tdap: 2011  Influenza: declines Pneumococcal: 2012 Prevnar13: declines Shingles/Zostavax: 2014  Names of Other Physician/Practitioners you currently use: 1. [facility] here for primary care 2. Dr Katy Fitch, eye doctor, last visit  2016 3. Dr Radford Pax, DDS , dentist, last visit Jan 2018 Patient Care Team: [name], MD as PCP - General (Internal  Medicine) Druscilla Brownie, MD as Consulting Physician (Dermatology) Teena Irani, MD as Consulting Physician (Gastroenterology) Newman Pies, MD as Consulting Physician (Neurosurgery) Clent Jacks, MD as Consulting Physician (Ophthalmology)  Surgical: He  has a past surgical history that includes Spine surgery (2007); Knee arthroscopy (Left, 1999);  nasal smr np3 (1985); and Vasectomy (1983). Family His family history includes Arthritis in his sister; Cancer in his father and mother; Diabetes in his father; Heart disease in his sister. Social history  He reports that he quit smoking about 36 years ago. His smoking use included cigarettes. He quit after 30.00 years of use. he has never used smokeless tobacco. He reports that he drinks alcohol. He reports that he does not use drugs.  MEDICARE WELLNESS OBJECTIVES: Physical activity:   Cardiac risk factors:   Depression/mood screen:   Depression screen [facility] 2/9 04/24/2017  Decreased Interest 0  Down, Depressed, Hopeless 0  PHQ - 2 Score 0    ADLs:  In your present state of health, do you have any difficulty performing the  following activities: 04/24/2017 09/15/2016  Hearing? N N  Vision? N N  Difficulty concentrating or making decisions? N N  Walking or climbing stairs? N N  Dressing or bathing? N N  Doing errands, shopping? N N  Some recent data might be hidden     Cognitive Testing  Alert? Yes  Normal Appearance?Yes  Oriented to person? Yes  Place? Yes   Time? Yes  Recall of three objects?  Yes  Can perform simple calculations? Yes  Displays appropriate judgment?Yes  Can read the correct time from a watch face?Yes  EOL planning: Does Patient Have a Medical Advance Directive?: Yes Type of Advance Directive: Healthcare Power of Attorney, Living will Copy of [place] in Chart?: No - copy requested   Objective:   Today's Vitals   08/14/17 1029  BP: 128/72  Pulse: 67  Resp: 18  Temp: 97.7 F (36.5  C)  SpO2: 97%  Weight: 225 lb 9.6 oz (102.3 kg)  Height: 6\' 1"  (1.854 m)   Body mass index is 29.76 kg/m.  General appearance: alert, no distress, WD/WN, male HEENT: normocephalic, sclerae anicteric, TMs pearly, nares patent, no discharge or erythema, pharynx normal Oral cavity: MMM, no lesions Neck: supple, no lymphadenopathy, no thyromegaly, no masses Heart: RRR, normal S1, S2, no murmurs Lungs: CTA bilaterally, no wheezes, rhonchi, or rales Abdomen: +bs, soft, non tender, non distended, no masses, no hepatomegaly, no splenomegaly Musculoskeletal: nontender, no swelling, no obvious deformity Extremities: no edema, no cyanosis, no clubbing Pulses: 2+ symmetric, upper and lower extremities, normal cap refill Neurological: alert, oriented x 3, CN2-12 intact, strength normal upper extremities and lower extremities, sensation normal throughout, DTRs 2+ throughout, no cerebellar signs, gait normal Psychiatric: normal affect, behavior normal, pleasant   Medicare Attestation I have personally reviewed: The patient's medical and social history Their use of alcohol, tobacco or illicit drugs Their current medications and supplements The patient's functional ability including ADLs,fall risks, home safety risks, cognitive, and hearing and visual impairment Diet and physical activities Evidence for depression or mood disorders  The patient's weight, height, BMI, and visual acuity have been recorded in the chart.  I have made referrals, counseling, and provided education to the patient based on review of the above and I have provided the patient with a written personalized care plan for preventive services.     Vicie Mutters, PA-C   08/14/2017

## 2017-08-17 DIAGNOSIS — F331 Major depressive disorder, recurrent, moderate: Secondary | ICD-10-CM | POA: Diagnosis not present

## 2017-08-18 ENCOUNTER — Other Ambulatory Visit: Payer: Self-pay | Admitting: Internal Medicine

## 2017-08-18 MED ORDER — ROSUVASTATIN CALCIUM 40 MG PO TABS: ORAL_TABLET | ORAL | 1 refills | Status: DC

## 2017-08-24 DIAGNOSIS — F331 Major depressive disorder, recurrent, moderate: Secondary | ICD-10-CM | POA: Diagnosis not present

## 2017-08-25 DIAGNOSIS — F331 Major depressive disorder, recurrent, moderate: Secondary | ICD-10-CM | POA: Diagnosis not present

## 2017-08-29 ENCOUNTER — Encounter: Payer: Self-pay | Admitting: Neurology

## 2017-08-31 ENCOUNTER — Telehealth: Payer: Self-pay | Admitting: Neurology

## 2017-08-31 DIAGNOSIS — F331 Major depressive disorder, recurrent, moderate: Secondary | ICD-10-CM | POA: Diagnosis not present

## 2017-08-31 NOTE — Telephone Encounter (Signed)
Called the patient to make him aware that looks like he was just set up with his CPAP machine on 07/31/17. Called the patient that the apt that was scheduled tomorrow we can cancel and that we can keep the scheduled apt in December with Cecille Rubin to allow for more data. No answer. Called both home and work phone and left this message.

## 2017-09-01 ENCOUNTER — Ambulatory Visit: Payer: Self-pay | Admitting: Neurology

## 2017-09-08 ENCOUNTER — Other Ambulatory Visit: Payer: Self-pay | Admitting: Internal Medicine

## 2017-09-16 ENCOUNTER — Encounter: Payer: Self-pay | Admitting: Neurology

## 2017-09-17 NOTE — Progress Notes (Signed)
[facility]  PATIENT: Cody Hines. DOB: 24-Jun-1946   REASON FOR VISIT: Obstructive sleep apnea with initial CPAP compliance HISTORY FROM: Patient    HISTORY OF PRESENT ILLNESS:UPDATE 12/14/2018CM Cody Hines, Cody Hines returns for follow-up for initial CPAP compliance.  He has a history of obstructive sleep apnea confirmed by sleep study.  Patient currently has a cold and has not been using his CPAP consistently.  He was using a mask but has switched to nasal pillows which he prefers.  Data dated 08/18/2017-09/16/2017 shows greater than 4-hour compliance at 50%.  Average usage 6 hours 21 minutes.  Pressure at 11 cm.  EPR level 2 AHI 1.7.  No leaks. Patient was made aware that he needs to use it at least 70% of the time greater than 4 hours he returns for reevaluation.  7/25/18[name]. is a 71 y.o. Hines , seen here as in a referral/ revisit  from Dr. Melford Aase for a sleep evaluation,  Miss dismisses this 71 year old Caucasian right-handed gentleman presents today upon referral of his primary care physician for evaluation of sleep apnea. Apparently the patient was diagnosed many years ago, at the [facility]. He was diagnosed with apnea for the first time 20 some years ago, he kept his very old ResMed CPAP but states that it has never ever helped him sleeping better and he could not tolerate various interfaces. He tried nasal cannulas, nasal and full face masks. He is claustrophobic. He would like to know if there is alternatives to CPAP available such as a dental device, but to give such recommendations we need to know what kind of apnea he has and to what degree. I also explained that comorbidities such as low nocturnal oxygen levels cannot be corrected by a dental device but only by CPAP. It may be that CPAP was not the right modality for him, and he may respond better to a BiPAP. His history of UPPP may exclude him from dental  device use.    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 He is just now had hyaluronic acid injections.ENT surgery - the patient has undergone a UPPP and septoplasty. This did not cure the apnea. His wife left the marital bedroom because of his ongoing grunting , snoring and apnea. The patient was 46 at the time.   Sleep habits are as follows: Cody Hines usual bedtime is 11 PM, he does not have trouble falling asleep, has nocturia times 2, and usually sleep through. Rising time now as a retiree in AM 7:30. He describes his bedroom is cool, quiet and dark and she sleeps alone, he prefers to sleep on his side, with many pillows. He doesn't feel that he dreams nightly and that his dreams are very vivid. When he wakes up in the morning he feels not refreshed or restored, always has a dry mouth, parched, rarely does he wake up with headaches, neither dizziness, palpitations, chest pain. He does wake up diaphoretic.   REVIEW OF SYSTEMS: Full 14 system review of systems performed and notable only for those listed, all others are neg:  Constitutional: neg  Cardiovascular: neg Ear/Nose/Throat: neg  Skin: neg Eyes: neg Respiratory: neg Gastroitestinal: neg  Hematology/Lymphatic: Easy bruising Endocrine: neg Musculoskeletal:neg Allergy/Immunology: neg Neurological: History of headaches Psychiatric:  Depression Sleep : Obstructive sleep apnea with CPAP   ALLERGIES: Allergies  Allergen Reactions  . Asa [Aspirin]   . Atorvastatin     Muscle aches  . Celebrex [Celecoxib]   . Codeine   . Penicillins   . Savella [Milnacipran Hcl]   . Viagra [Sildenafil Citrate] Other (See Comments)    Headache    HOME MEDICATIONS: Outpatient Medications Prior to Visit  Medication Sig  Dispense Refill  . aspirin EC 81 MG tablet Take 81 mg by mouth daily.    . enalapril (VASOTEC) 20 MG tablet TAKE 1 TABLET BY MOUTH  EVERY DAY FOR BLOOD  PRESSURE 120 tablet 0  . Flaxseed, Linseed, (FLAX SEED OIL) 1000 MG CAPS Take 1 capsule by mouth 2 (two) times daily.     Marland Kitchen LORazepam (ATIVAN) 0.5 MG tablet     . Magnesium 500 MG TABS Take by mouth daily.    Marland Kitchen OVER THE COUNTER MEDICATION Patient takes Vitamin D daily and thinks it it 5000 units.    . rosuvastatin (CRESTOR) 40 MG tablet Take 1 tablet daily for Cholesterol 90 tablet 1  . sertraline (ZOLOFT) 50 MG tablet      No facility-administered medications prior to visit.     PAST MEDICAL HISTORY: Past Medical History:  Diagnosis Date  . BPH (benign prostatic hyperplasia)   . Elevated hemoglobin A1c   . Fibromyalgia   . GERD (gastroesophageal reflux disease)   . Hyperlipidemia   . Hypertension   . Hypogonadism Hines   . IBS (irritable bowel syndrome)   . OSA (obstructive sleep apnea)     PAST SURGICAL HISTORY: Past Surgical History:  Procedure Laterality Date  .  nasal smr np3  1985  . KNEE ARTHROSCOPY Left 1999  . SPINE SURGERY  2007   L5 S 1 Disk  . VASECTOMY  1983    FAMILY HISTORY: Family History  Problem Relation Age of Onset  . Cancer Mother        breast  . Cancer Father        lung  . Diabetes Father   . Heart disease Sister   . Arthritis Sister     SOCIAL HISTORY: Social History   Socioeconomic History  . Marital status: Legally Separated    Spouse name: Kennyth Lose  . Number of children: Not on file  . Years of education: Not on file  . Highest education level: Not on file  Social Needs  . Financial resource strain: Not on file  . Food insecurity - worry: Not on file  . Food insecurity - inability: Not on file  . Transportation needs - medical: Not on file  . Transportation needs - non-medical: Not on file  Occupational History  . 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 Smoking status: Former  Smoker    Years: 30.00    Types: Cigarettes    Last attempt to quit: 10/06/1980    Years since quitting: 36.9  . Smokeless tobacco: Never Used  Substance and Sexual Activity  . Alcohol use: No    Frequency: Never    Comment: Not drinking x 1 month  . Drug use: No  . Sexual activity: Not Currently  Other Topics Concern  . Not on file  Social History Narrative  . Not on file     PHYSICAL EXAM  Vitals:   09/18/17 0916  BP: (!) 144/62  Pulse: 70  Resp: 18  Weight: 228 lb (103.4 kg)  Height: 6\' 1"  (1.854  m)   Body mass index is 30.08 kg/m.  Generalized: Well developed, obese Hines in no acute distress  Head: normocephalic and atraumatic,. Oropharynx benign  Neck: Supple Musculoskeletal: No deformity   Neurological examination   Mentation: Alert oriented to time, place, history taking. Attention span and concentration appropriate. Recent and remote memory intact.  Follows all commands speech and language fluent.   Cranial nerve II-XII: Pupils were equal round reactive to light extraocular movements were full, visual field were full on confrontational test. Facial sensation and strength were normal. hearing was intact to finger rubbing bilaterally. Uvula tongue midline. head turning and shoulder shrug were normal and symmetric.Tongue protrusion into cheek strength was normal. Motor: normal bulk and tone, full strength in the BUE, BLE,  Sensory: normal and symmetric to light touch,  Coordination: finger-nose-finger, heel-to-shin bilaterally, no dysmetria Gait and Station: Rising up from seated position without assistance, normal stance,  moderate stride, no difficulty with turns DIAGNOSTIC DATA (LABS, IMAGING, TESTING) - I reviewed patient records, labs, notes, testing and imaging myself where available.  Lab Results  Component Value Date   WBC 7.3 08/14/2017   HGB 14.8 08/14/2017   HCT 43.5 08/14/2017   MCV 88.8 08/14/2017   PLT 210 08/14/2017      Component Value  Date/Time   NA 139 08/14/2017 1115   K 4.2 08/14/2017 1115   CL 104 08/14/2017 1115   CO2 25 08/14/2017 1115   GLUCOSE 135 (H) 08/14/2017 1115   BUN 13 08/14/2017 1115   CREATININE 1.58 (H) 08/14/2017 1115   CALCIUM 9.8 08/14/2017 1115   PROT 6.8 08/14/2017 1115   ALBUMIN 4.2 04/24/2017 1153   AST 20 08/14/2017 1115   ALT 20 08/14/2017 1115   ALKPHOS 49 04/24/2017 1153   BILITOT 0.6 08/14/2017 1115   GFRNONAA 44 (L) 08/14/2017 1115   GFRAA 51 (L) 08/14/2017 1115   Lab Results  Component Value Date   CHOL 167 08/14/2017   HDL 45 08/14/2017   LDLCALC 53 04/24/2017   TRIG 259 (H) 08/14/2017   CHOLHDL 3.7 08/14/2017   Lab Results  Component Value Date   HGBA1C 5.5 04/24/2017    Lab Results  Component Value Date   TSH 2.17 08/14/2017      ASSESSMENT AND PLAN  71 y.o. year old Hines  has a past medical history of BPH (benign prostatic hyperplasia), Elevated hemoglobin A1c, Fibromyalgia, GERD (gastroesophageal reflux disease), Hyperlipidemia, Hypertension, Hypogonadism Hines, IBS (irritable bowel syndrome), and OSA (obstructive sleep apnea). here to follow-up for obstructive sleep apnea with first CPAP compliance data.Data dated 08/18/2017-09/16/2017 shows greater than 4-hour compliance at 50%.  Average usage 6 hours 21 minutes.  Pressure at 11 cm.  EPR level 2 AHI 1.7.  No leaks.The patient is a current patient of Dr. Brett Fairy  who is out of the office today . This note is sent to the work in doctor.       CPAP compliance greater than 4 hours at 50% Needs to be greater than 70% per Medicare guidelines Continue same settings Follow-up in 4 months for compliance check Dennie Bible, [facility], [facility], APRN  [facility] [address], [place], [place] [address] [phone]

## 2017-09-18 ENCOUNTER — Ambulatory Visit (INDEPENDENT_AMBULATORY_CARE_PROVIDER_SITE_OTHER): Payer: Medicare Other | Admitting: Nurse Practitioner

## 2017-09-18 ENCOUNTER — Other Ambulatory Visit: Payer: Self-pay

## 2017-09-18 ENCOUNTER — Encounter: Payer: Self-pay | Admitting: Nurse Practitioner

## 2017-09-18 VITALS — BP 144/62 | HR 70 | Resp 18 | Ht 73.0 in | Wt 228.0 lb

## 2017-09-18 DIAGNOSIS — Z9989 Dependence on other enabling machines and devices: Secondary | ICD-10-CM | POA: Diagnosis not present

## 2017-09-18 DIAGNOSIS — G4733 Obstructive sleep apnea (adult) (pediatric): Secondary | ICD-10-CM | POA: Diagnosis not present

## 2017-09-18 NOTE — Progress Notes (Signed)
I reviewed note and agree with plan.   Penni Bombard, MD 58/85/0277, 4:12 PM Certified in Neurology, Neurophysiology and Neuroimaging  [facility] [address], [place], [place] [address] [phone]

## 2017-09-18 NOTE — Patient Instructions (Signed)
CPAP compliance greater than 4 hours at 50% Needs to be greater than 70% Continue same settings Follow-up in 4 months for compliance check

## 2017-09-21 DIAGNOSIS — F331 Major depressive disorder, recurrent, moderate: Secondary | ICD-10-CM | POA: Diagnosis not present

## 2017-10-05 DIAGNOSIS — F331 Major depressive disorder, recurrent, moderate: Secondary | ICD-10-CM | POA: Diagnosis not present

## 2017-10-12 DIAGNOSIS — F331 Major depressive disorder, recurrent, moderate: Secondary | ICD-10-CM | POA: Diagnosis not present

## 2017-10-14 ENCOUNTER — Encounter: Payer: Self-pay | Admitting: Internal Medicine

## 2017-10-17 DIAGNOSIS — F331 Major depressive disorder, recurrent, moderate: Secondary | ICD-10-CM | POA: Diagnosis not present

## 2017-10-19 DIAGNOSIS — F331 Major depressive disorder, recurrent, moderate: Secondary | ICD-10-CM | POA: Diagnosis not present

## 2017-10-26 DIAGNOSIS — F331 Major depressive disorder, recurrent, moderate: Secondary | ICD-10-CM | POA: Diagnosis not present

## 2017-11-02 ENCOUNTER — Other Ambulatory Visit: Payer: Self-pay | Admitting: Internal Medicine

## 2017-11-02 DIAGNOSIS — F331 Major depressive disorder, recurrent, moderate: Secondary | ICD-10-CM | POA: Diagnosis not present

## 2017-11-09 DIAGNOSIS — F331 Major depressive disorder, recurrent, moderate: Secondary | ICD-10-CM | POA: Diagnosis not present

## 2017-11-16 DIAGNOSIS — F331 Major depressive disorder, recurrent, moderate: Secondary | ICD-10-CM | POA: Diagnosis not present

## 2017-11-20 ENCOUNTER — Ambulatory Visit (INDEPENDENT_AMBULATORY_CARE_PROVIDER_SITE_OTHER): Payer: Medicare Other | Admitting: Internal Medicine

## 2017-11-20 VITALS — BP 126/74 | HR 60 | Temp 97.9°F | Resp 18 | Ht 73.0 in | Wt 229.2 lb

## 2017-11-20 DIAGNOSIS — Z125 Encounter for screening for malignant neoplasm of prostate: Secondary | ICD-10-CM | POA: Diagnosis not present

## 2017-11-20 DIAGNOSIS — R7309 Other abnormal glucose: Secondary | ICD-10-CM

## 2017-11-20 DIAGNOSIS — N401 Enlarged prostate with lower urinary tract symptoms: Secondary | ICD-10-CM

## 2017-11-20 DIAGNOSIS — G4733 Obstructive sleep apnea (adult) (pediatric): Secondary | ICD-10-CM

## 2017-11-20 DIAGNOSIS — Z1211 Encounter for screening for malignant neoplasm of colon: Secondary | ICD-10-CM

## 2017-11-20 DIAGNOSIS — E349 Endocrine disorder, unspecified: Secondary | ICD-10-CM | POA: Diagnosis not present

## 2017-11-20 DIAGNOSIS — Z136 Encounter for screening for cardiovascular disorders: Secondary | ICD-10-CM

## 2017-11-20 DIAGNOSIS — I10 Essential (primary) hypertension: Secondary | ICD-10-CM | POA: Diagnosis not present

## 2017-11-20 DIAGNOSIS — Z87891 Personal history of nicotine dependence: Secondary | ICD-10-CM | POA: Diagnosis not present

## 2017-11-20 DIAGNOSIS — Z79899 Other long term (current) drug therapy: Secondary | ICD-10-CM | POA: Diagnosis not present

## 2017-11-20 DIAGNOSIS — M797 Fibromyalgia: Secondary | ICD-10-CM | POA: Diagnosis not present

## 2017-11-20 DIAGNOSIS — R7303 Prediabetes: Secondary | ICD-10-CM

## 2017-11-20 DIAGNOSIS — Z8249 Family history of ischemic heart disease and other diseases of the circulatory system: Secondary | ICD-10-CM

## 2017-11-20 DIAGNOSIS — Z1212 Encounter for screening for malignant neoplasm of rectum: Secondary | ICD-10-CM

## 2017-11-20 DIAGNOSIS — E559 Vitamin D deficiency, unspecified: Secondary | ICD-10-CM | POA: Diagnosis not present

## 2017-11-20 DIAGNOSIS — E782 Mixed hyperlipidemia: Secondary | ICD-10-CM

## 2017-11-20 DIAGNOSIS — Z9989 Dependence on other enabling machines and devices: Secondary | ICD-10-CM

## 2017-11-20 NOTE — Patient Instructions (Signed)

## 2017-11-20 NOTE — Progress Notes (Signed)
[facility]   [name], M.D.     Cody Hines Hines, Cody Hines Hines [address]iane [address], [place]                [place] [address] Telephone [phone] Telefax [phone] Annual  Screening/Preventative Visit  & Comprehensive Evaluation & Examination     This very nice 72 y.o. Sep WM presents for a Screening/Preventative Visit & comprehensive evaluation and management of multiple medical co-morbidities.  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     Patient has long hx/o OSA (>25+ yrs) and last year had re-evaluation by Dr Brett Fairy and is back on CPAP with improved restorative sleep.      .After 65 years marriage , patient has since last year had a marital separation and is following w/Dr Clovis Pu at [facility]       HTN predates since 20. Patient's BP has been controlled at home.  Today's BP is at goal - 126/74. Patient denies any cardiac symptoms as chest pain, palpitations, shortness of breath, dizziness or ankle swelling.     Patient's hyperlipidemia is controlled with diet and medications. Patient denies myalgias or other medication SE's. 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  Patient has been attempting dietary mgm't and last A1c was Normal & at goal: Lab Results  Component Value Date   HGBA1C 5.5 04/24/2017       Finally, patient has history of Vitamin D Deficiency ("29"/2008) and last vitamin D was at goal: Lab Results  Component Value Date   VD25OH 60 04/24/2017   Current Outpatient Medications  on File Prior to Visit  Medication Sig  . aspirin EC 81 MG tablet Take 81 mg by mouth daily.  . enalapril (VASOTEC) 20 MG tablet TAKE 1 TABLET BY MOUTH  EVERY DAY FOR BLOOD  PRESSURE  . Flaxseed, Linseed, (FLAX SEED OIL) 1000 MG CAPS Take 1 capsule by mouth 2 (two) times daily.   Marland Kitchen LORazepam (ATIVAN) 0.5 MG tablet   . Magnesium 500 MG TABS Take by mouth daily.  Marland Kitchen OVER THE COUNTER MEDICATION Patient takes Vitamin D daily and thinks it it 5000 units.  . rosuvastatin (CRESTOR) 40 MG tablet TAKE 1 TABLET BY MOUTH  DAILY FOR CHOLESTEROL  . sertraline (ZOLOFT) 100 MG tablet Take 100 mg by mouth daily.   No current facility-administered medications on file prior to visit.    Allergies  Allergen Reactions  . Asa [Aspirin]   . Atorvastatin     Muscle aches  . Celebrex [Celecoxib]   . Codeine   . Penicillins   . Savella [Milnacipran Hcl]   . Viagra [Sildenafil Citrate] Other (See Comments)    Headache   Past Medical History:  Diagnosis Date  . BPH (benign prostatic hyperplasia)   . Elevated hemoglobin A1c   . Fibromyalgia   . GERD (gastroesophageal reflux disease)   . Hyperlipidemia   . Hypertension   . Hypogonadism male   . IBS (irritable bowel syndrome)   .  OSA (obstructive sleep apnea)    Health Maintenance  Topic Date Due  . INFLUENZA VACCINE  01/07/2018 (Originally 05/06/2017)  . PNA vac Low Risk Adult (1 of 2 - PCV13) 08/06/2018 (Originally 08/19/2013)  . TETANUS/TDAP  01/14/2020  . COLONOSCOPY  04/23/2023  . Hepatitis C Screening  Completed   Immunization History  Administered Date(s) Administered  . Pneumococcal-Unspecified 07/16/2011  . Tdap 01/13/2010  . Zoster 08/09/2013   Last Colon -  04/22/2013 - Dr Teena Irani recc 5 yr f/u due July 2019.   Past Surgical History:  Procedure Laterality Date  .  nasal smr np3  1985  . KNEE ARTHROSCOPY Left 1999  . SPINE SURGERY  2007   L5 S 1 Disk  . VASECTOMY  1983   Family History  Problem Relation Age of Onset  .  Cancer Mother        breast  . Cancer Father        lung  . Diabetes Father   . Heart disease Sister   . Arthritis Sister    Social History   Socioeconomic History  . Marital status: Legally Separated    Spouse name: Kennyth Lose  . Number of children: 1 daughter  Occupational History  . Occupation: Retired Furniture conservator/restorer   Tobacco Use  . Smoking status: Former Smoker    Years: 30.00    Types: Cigarettes    Last attempt to quit: 10/06/1980    Years since quitting: 37.1  . Smokeless tobacco: Never Used  Substance and Sexual Activity  . Alcohol use: No    Frequency: Never    Comment: Not drinking x 1 month  . Drug use: No  . Sexual activity: Not Currently    ROS Constitutional: Denies fever, chills, weight loss/gain, headaches, insomnia,  night sweats or change in appetite. Does c/o fatigue. Eyes: Denies redness, blurred vision, diplopia, discharge, itchy or watery eyes.  ENT: Denies discharge, congestion, post nasal drip, epistaxis, sore throat, earache, hearing loss, dental pain, Tinnitus, Vertigo, Sinus pain or snoring.  Cardio: Denies chest pain, palpitations, irregular heartbeat, syncope, dyspnea, diaphoresis, orthopnea, PND, claudication or edema Respiratory: denies cough, dyspnea, DOE, pleurisy, hoarseness, laryngitis or wheezing.  Gastrointestinal: Denies dysphagia, heartburn, reflux, water brash, pain, cramps, nausea, vomiting, bloating, diarrhea, constipation, hematemesis, melena, hematochezia, jaundice or hemorrhoids Genitourinary: Denies dysuria,  discharge, hematuria or flank pain. He does experience frequency, urgency, nocturia x 2-3 and  hesitancy. Musculoskeletal: Denies arthralgia, myalgia, stiffness, Jt. Swelling, pain, limp or strain/sprain. Denies Falls. Skin: Denies puritis, rash, hives, warts, acne, eczema or change in skin lesion Neuro: No weakness, tremor, incoordination, spasms, paresthesia or pain Psychiatric: Denies confusion, memory loss or sensory loss. Denies  Depression. Endocrine: Denies change in weight, skin, hair change, nocturia, and paresthesia, diabetic polys, visual blurring or hyper / hypo glycemic episodes.  Heme/Lymph: No excessive bleeding, bruising or enlarged lymph nodes.  Physical Exam  BP 126/74   Pulse 60   Temp 97.9 F (36.6 C)   Resp 18   Ht 6\' 1"  (1.854 m)   Wt 229 lb 3.2 oz (104 kg)   BMI 30.24 kg/m   General Appearance: Well nourished and well groomed and in no apparent distress.  Eyes: PERRLA, EOMs, conjunctiva no swelling or erythema, normal fundi and vessels. Sinuses: No frontal/maxillary tenderness ENT/Mouth: EACs patent / TMs  nl. Nares clear without erythema, swelling, mucoid exudates. Oral hygiene is good. No erythema, swelling, or exudate. Tongue normal, non-obstructing. Tonsils not swollen or erythematous. Hearing normal.  Neck:  Supple, thyroid not palpable. No bruits, nodes or JVD. Respiratory: Respiratory effort normal.  BS equal and clear bilateral without rales, rhonci, wheezing or stridor. Cardio: Heart sounds are normal with regular rate and rhythm and no murmurs, rubs or gallops. Peripheral pulses are normal and equal bilaterally without edema. No aortic or femoral bruits. Chest: symmetric with normal excursions and percussion.  Abdomen: Soft, with Nl bowel sounds. Nontender, no guarding, rebound, hernias, masses, or organomegaly.  Lymphatics: Non tender without lymphadenopathy.  Genitourinary: No hernias.Testes nl. DRE - prostate nl for age - smooth & firm w/o nodules. Musculoskeletal: Full ROM all peripheral extremities, joint stability, 5/5 strength, and normal gait. Skin: Warm and dry without rashes, lesions, cyanosis, clubbing or  ecchymosis.  Neuro: Cranial nerves intact, reflexes equal bilaterally. Normal muscle tone, no cerebellar symptoms. Sensation intact.  Pysch: Alert and oriented X 3 with normal affect, insight and judgment appropriate.   Assessment and Plan  1. Annual  Preventative/Screening Exam   1. Essential hypertension  - EKG 12-Lead - [place], RETROPERITNL ABD,  LTD - Urinalysis, Routine w reflex microscopic - Microalbumin / creatinine urine ratio - CBC with Differential/Platelet - BASIC METABOLIC PANEL WITH GFR - Magnesium - TSH  2. Hyperlipidemia, mixed  - EKG 12-Lead - [place], RETROPERITNL ABD,  LTD - Hepatic function panel - Lipid panel - TSH  3. Prediabetes  - EKG 12-Lead - [place], RETROPERITNL ABD,  LTD - Hemoglobin A1c - Insulin, random  4. Vitamin D deficiency  - VITAMIN D 25 Hydroxy   5. Abnormal glucose  - Hemoglobin A1c - Insulin, random  6. Testosterone deficiency   7. Screening for colorectal cancer  - POC Hemoccult Bld/Stl  8. Prostate cancer screening  - PSA  9. Benign localized prostatic hyperplasia with lower urinary tract symptoms (LUTS)  - PSA  10. Fibromyalgia   11. Screening for ischemic heart disease  - EKG 12-Lead  12. Family history of ischemic heart disease  - EKG 12-Lead - [place], RETROPERITNL ABD,  LTD  13. Former smoker  - EKG 12-Lead - [place], RETROPERITNL ABD,  LTD  14. Screening for AAA (aortic abdominal aneurysm)  - [place], RETROPERITNL ABD,  LTD  15. OSA on CPAP  16. Medication management  - CBC with Differential/Platelet - BASIC METABOLIC PANEL WITH GFR - Hepatic function panel - Magnesium - Lipid panel - TSH - Hemoglobin A1c - Insulin, random - VITAMIN D 25 Hydroxy       Patient was counseled in prudent diet, weight control to achieve/maintain BMI less than 25, BP monitoring, regular exercise and medications as discussed.  Discussed med effects and SE's. Routine screening labs and tests as requested with regular follow-up as recommended. Over 40 minutes of exam, counseling, chart review and high complex critical decision making was performed

## 2017-11-21 ENCOUNTER — Encounter: Payer: Self-pay | Admitting: Internal Medicine

## 2017-11-23 DIAGNOSIS — F331 Major depressive disorder, recurrent, moderate: Secondary | ICD-10-CM | POA: Diagnosis not present

## 2017-11-23 LAB — CBC WITH DIFFERENTIAL/PLATELET
BASOS ABS: 18 {cells}/uL (ref 0–200)
Basophils Relative: 0.3 %
EOS ABS: 168 {cells}/uL (ref 15–500)
EOS PCT: 2.8 %
HEMATOCRIT: 43 % (ref 38.5–50.0)
HEMOGLOBIN: 14.3 g/dL (ref 13.2–17.1)
LYMPHS ABS: 1854 {cells}/uL (ref 850–3900)
MCH: 29.6 pg (ref 27.0–33.0)
MCHC: 33.3 g/dL (ref 32.0–36.0)
MCV: 89 fL (ref 80.0–100.0)
MPV: 11.8 fL (ref 7.5–12.5)
Monocytes Relative: 8.5 %
NEUTROS ABS: 3450 {cells}/uL (ref 1500–7800)
Neutrophils Relative %: 57.5 %
Platelets: 200 10*3/uL (ref 140–400)
RBC: 4.83 10*6/uL (ref 4.20–5.80)
RDW: 12.8 % (ref 11.0–15.0)
Total Lymphocyte: 30.9 %
WBC mixed population: 510 cells/uL (ref 200–950)
WBC: 6 10*3/uL (ref 3.8–10.8)

## 2017-11-23 LAB — URINALYSIS, ROUTINE W REFLEX MICROSCOPIC
BILIRUBIN URINE: NEGATIVE
HGB URINE DIPSTICK: NEGATIVE
KETONES UR: NEGATIVE
Leukocytes, UA: NEGATIVE
Nitrite: NEGATIVE
PH: 5.5 (ref 5.0–8.0)
PROTEIN: NEGATIVE
Specific Gravity, Urine: 1.014 (ref 1.001–1.03)

## 2017-11-23 LAB — LIPID PANEL
Cholesterol: 157 mg/dL (ref <200)
HDL: 43 mg/dL (ref >40)
LDL Cholesterol (Calc): 85 mg/dL (calc)
NON-HDL CHOLESTEROL (CALC): 114 mg/dL (ref <130)
TRIGLYCERIDES: 197 mg/dL — AB (ref <150)
Total CHOL/HDL Ratio: 3.7 (calc) (ref <5.0)

## 2017-11-23 LAB — BASIC METABOLIC PANEL WITH GFR
BUN/Creatinine Ratio: 8 (calc) (ref 6–22)
BUN: 11 mg/dL (ref 7–25)
CO2: 28 mmol/L (ref 20–32)
CREATININE: 1.34 mg/dL — AB (ref 0.70–1.25)
Calcium: 9.8 mg/dL (ref 8.6–10.3)
Chloride: 104 mmol/L (ref 98–110)
GFR, EST NON AFRICAN AMERICAN: 54 mL/min/{1.73_m2} — AB (ref >=60)
GFR, Est African American: 62 mL/min/{1.73_m2} (ref >=60)
Glucose, Bld: 218 mg/dL — ABNORMAL HIGH (ref 65–99)
Potassium: 4.2 mmol/L (ref 3.5–5.3)
SODIUM: 140 mmol/L (ref 135–146)

## 2017-11-23 LAB — HEPATIC FUNCTION PANEL
AG RATIO: 2 (calc) (ref 1.0–2.5)
ALKALINE PHOSPHATASE (APISO): 71 U/L (ref 40–115)
ALT: 70 U/L — AB (ref 9–46)
AST: 68 U/L — ABNORMAL HIGH (ref 10–35)
Albumin: 4.3 g/dL (ref 3.6–5.1)
BILIRUBIN TOTAL: 0.8 mg/dL (ref 0.2–1.2)
Bilirubin, Direct: 0.2 mg/dL (ref 0.0–0.2)
Globulin: 2.2 g/dL (calc) (ref 1.9–3.7)
Indirect Bilirubin: 0.6 mg/dL (calc) (ref 0.2–1.2)
Total Protein: 6.5 g/dL (ref 6.1–8.1)

## 2017-11-23 LAB — MAGNESIUM: Magnesium: 2.1 mg/dL (ref 1.5–2.5)

## 2017-11-23 LAB — INSULIN, RANDOM: INSULIN: 97.8 u[IU]/mL — AB (ref 2.0–19.6)

## 2017-11-23 LAB — HEMOGLOBIN A1C
HEMOGLOBIN A1C: 6.3 %{Hb} — AB (ref <5.7)
MEAN PLASMA GLUCOSE: 134 (calc)
eAG (mmol/L): 7.4 (calc)

## 2017-11-23 LAB — PSA: PSA: 0.6 ng/mL (ref <=4.0)

## 2017-11-23 LAB — MICROALBUMIN / CREATININE URINE RATIO
CREATININE, URINE: 110 mg/dL (ref 20–320)
Microalb Creat Ratio: 6 mcg/mg creat (ref <30)
Microalb, Ur: 0.7 mg/dL

## 2017-11-23 LAB — TSH: TSH: 2.22 mIU/L (ref 0.40–4.50)

## 2017-11-23 LAB — VITAMIN D 25 HYDROXY (VIT D DEFICIENCY, FRACTURES): Vit D, 25-Hydroxy: 50 ng/mL (ref 30–100)

## 2017-11-30 DIAGNOSIS — F331 Major depressive disorder, recurrent, moderate: Secondary | ICD-10-CM | POA: Diagnosis not present

## 2017-12-07 ENCOUNTER — Other Ambulatory Visit: Payer: Self-pay | Admitting: Adult Health

## 2017-12-07 DIAGNOSIS — F331 Major depressive disorder, recurrent, moderate: Secondary | ICD-10-CM | POA: Diagnosis not present

## 2017-12-08 DIAGNOSIS — H10413 Chronic giant papillary conjunctivitis, bilateral: Secondary | ICD-10-CM | POA: Diagnosis not present

## 2017-12-08 DIAGNOSIS — H2513 Age-related nuclear cataract, bilateral: Secondary | ICD-10-CM | POA: Diagnosis not present

## 2017-12-09 LAB — HM DIABETES EYE EXAM

## 2017-12-15 DIAGNOSIS — F331 Major depressive disorder, recurrent, moderate: Secondary | ICD-10-CM | POA: Diagnosis not present

## 2017-12-21 DIAGNOSIS — F331 Major depressive disorder, recurrent, moderate: Secondary | ICD-10-CM | POA: Diagnosis not present

## 2017-12-29 DIAGNOSIS — F331 Major depressive disorder, recurrent, moderate: Secondary | ICD-10-CM | POA: Diagnosis not present

## 2018-01-04 DIAGNOSIS — F331 Major depressive disorder, recurrent, moderate: Secondary | ICD-10-CM | POA: Diagnosis not present

## 2018-01-11 DIAGNOSIS — F331 Major depressive disorder, recurrent, moderate: Secondary | ICD-10-CM | POA: Diagnosis not present

## 2018-01-13 ENCOUNTER — Encounter: Payer: Self-pay | Admitting: Nurse Practitioner

## 2018-01-15 NOTE — Progress Notes (Signed)
[facility]  PATIENT: Cody Hines. DOB: 1946-08-11   REASON FOR VISIT: Obstructive sleep apnea with initial CPAP compliance HISTORY FROM: Patient    HISTORY OF PRESENT ILLNESS:UPDATE 4/15/2019CM Mr. Cody Hines, 72 year old male returns for follow-up with history of obstructive sleep apnea here for CPAP compliance.  He claims due to all of his allergies he has not been able to use his CPAP every night due to stuffiness.  He is on Flonase for his allergies.  He has nasal prongs.  Compliance data dated 11/15/2017 -4/ 10 2019 shows compliance greater than 4 hours at 63% which is better than when last seen at 50%.  Average usage 5 hours 39 minutes.  Set pressure of 11 cm EPR level 2 AHI 2.3..  He returns for reevaluation    UPDATE 12/14/2018CM Mr. Cody Hines, 72 year old male returns for follow-up for initial CPAP compliance.  He has a history of obstructive sleep apnea confirmed by sleep study.  Patient currently has a cold and has not been using his CPAP consistently.  He was using a mask but has switched to nasal pillows which he prefers.  Data dated 08/18/2017-09/16/2017 shows greater than 4-hour compliance at 50%.  Average usage 6 hours 21 minutes.  Pressure at 11 cm.  EPR level 2 AHI 1.7.  No leaks. Patient was made aware that he needs to use it at least 70% of the time greater than 4 hours he returns for reevaluation.  7/25/18CDEverette P [name]. is a 72 y.o. male , seen here as in a referral/ revisit  from Dr. Melford Aase for a sleep evaluation,  Miss dismisses this 72 year old Caucasian right-handed gentleman presents today upon referral of his primary care physician for evaluation of sleep apnea. Apparently the patient was diagnosed many years ago, at the [facility] heart and sleep center. He was diagnosed with apnea for the first time 20 some years ago, he kept his very old ResMed CPAP but states that it has never ever helped him sleeping better and he could not tolerate  various interfaces. He tried nasal cannulas, nasal and full face masks. He is claustrophobic. He would like to know if there is alternatives to CPAP available such as a dental device, but to give such recommendations we need to know what kind of apnea he has and to what degree. I also explained that comorbidities such as low nocturnal oxygen levels cannot be corrected by a dental device but only by CPAP. It may be that CPAP was not the right modality for him, and he may respond better to a BiPAP. His history of UPPP may exclude him from dental device use.    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 He is just now had hyaluronic acid injections.ENT surgery - the patient has undergone a UPPP and septoplasty. This did not cure the apnea. His wife left the marital bedroom because of his ongoing grunting , snoring and apnea. The patient was 46 at the time.   Sleep habits are as follows: Cody Hines usual bedtime is 11 PM, he does not have trouble falling asleep, has nocturia times 2, and usually sleep through. Rising time now as a retiree in AM 7:30. He describes his bedroom is cool, quiet and dark and she sleeps alone, he prefers to sleep on his side,  with many pillows. He doesn't feel that he dreams nightly and that his dreams are very vivid. When he wakes up in the morning he feels not refreshed or restored, always has a dry mouth, parched, rarely does he wake up with headaches, neither dizziness, palpitations, chest pain. He does wake up diaphoretic.   REVIEW OF SYSTEMS: Full 14 system review of systems performed and notable only for those listed, all others are neg:  Constitutional: neg  Cardiovascular: neg Ear/Nose/Throat: neg  Skin: neg Eyes: Itching Respiratory:  neg Gastroitestinal: neg  Hematology/Lymphatic: Easy bruising Endocrine: neg Musculoskeletal:neg Allergy/Immunology: Environmental allergies Neurological: History of headaches Psychiatric: Depression Sleep : Obstructive sleep apnea with CPAP   ALLERGIES: Allergies  Allergen Reactions  . Asa [Aspirin]   . Atorvastatin     Muscle aches  . Celebrex [Celecoxib]   . Codeine   . Penicillins   . Savella [Milnacipran Hcl]   . Viagra [Sildenafil Citrate] Other (See Comments)    Headache    HOME MEDICATIONS: Outpatient Medications Prior to Visit  Medication Sig Dispense Refill  . aspirin EC 81 MG tablet Take 81 mg by mouth daily.    . enalapril (VASOTEC) 20 MG tablet TAKE 1 TABLET BY MOUTH  EVERY DAY FOR BLOOD  PRESSURE 120 tablet 0  . Flaxseed, Linseed, (FLAX SEED OIL) 1000 MG CAPS Take 1 capsule by mouth 2 (two) times daily.     Marland Kitchen LORazepam (ATIVAN) 0.5 MG tablet     . Magnesium 500 MG TABS Take by mouth daily.    Marland Kitchen OVER THE COUNTER MEDICATION Patient takes Vitamin D daily and thinks it it 5000 units.    . rosuvastatin (CRESTOR) 40 MG tablet TAKE 1 TABLET BY MOUTH  DAILY FOR CHOLESTEROL 90 tablet 1  . sertraline (ZOLOFT) 100 MG tablet Take 100 mg by mouth daily.     No facility-administered medications prior to visit.     PAST MEDICAL HISTORY: Past Medical History:  Diagnosis Date  . BPH (benign prostatic hyperplasia)   . Elevated hemoglobin A1c   . Fibromyalgia   . GERD (gastroesophageal reflux disease)   . Hyperlipidemia   . Hypertension   . Hypogonadism male   . IBS (irritable bowel syndrome)   . OSA (obstructive sleep apnea)     PAST SURGICAL HISTORY: Past Surgical History:  Procedure Laterality Date  .  nasal smr np3  1985  . KNEE ARTHROSCOPY Left 1999  . SPINE SURGERY  2007   L5 S 1 Disk  . VASECTOMY  1983    FAMILY HISTORY: Family History  Problem Relation Age of Onset  . Cancer Mother        breast  . Cancer Father        lung  . Diabetes Father    . Heart disease Sister   . Arthritis Sister     SOCIAL HISTORY: Social History   Socioeconomic History  . Marital status: Legally Separated    Spouse name: Cody Hines  . Number of children: Not on file  . Years of education: Not on file  . Highest education level: Not on file  Occupational History  . 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 Financial resource strain: Not on file  . Food insecurity:    Worry: Not on file    Inability: Not on file  . Transportation needs:    Medical: Not on file    Non-medical: Not on file  Tobacco Use  . Smoking status: Former Smoker  Years: 30.00    Types: Cigarettes    Last attempt to quit: 10/06/1980    Years since quitting: 37.3  . Smokeless tobacco: Never Used  Substance and Sexual Activity  . Alcohol use: No    Frequency: Never    Comment: Not drinking x 1 month  . Drug use: No  . Sexual activity: Not Currently  Lifestyle  . Physical activity:    Days per week: Not on file    Minutes per session: Not on file  . Stress: Not on file  Relationships  . Social connections:    Talks on phone: Not on file    Gets together: Not on file    Attends religious service: Not on file    Active member of club or organization: Not on file    Attends meetings of clubs or organizations: Not on file    Relationship status: Not on file  . Intimate partner violence:    Fear of current or ex partner: Not on file    Emotionally abused: Not on file    Physically abused: Not on file    Forced sexual activity: Not on file  Other Topics Concern  . Not on file  Social History Narrative  . Not on file     PHYSICAL EXAM  Vitals:   01/18/18 0832  BP: 135/74  Pulse: (!) 59  Weight: 228 lb 12.8 oz (103.8 kg)  Height: 6\' 1"  (1.854 m)   Body mass index is 30.19 kg/m.  Generalized: Well developed, obese male in no acute distress  Head: normocephalic and atraumatic,. Oropharynx benign  Neck: Supple Musculoskeletal: No deformity    Neurological examination   Mentation: Alert oriented to time, place, history taking. Attention span and concentration appropriate. Recent and remote memory intact.  Follows all commands speech and language fluent.   Cranial nerve II-XII: Pupils were equal round reactive to light extraocular movements were full, visual field were full on confrontational test. Facial sensation and strength were normal. hearing was intact to finger rubbing bilaterally. Uvula tongue midline. head turning and shoulder shrug were normal and symmetric.Tongue protrusion into cheek strength was normal. Motor: normal bulk and tone, full strength in the BUE, BLE,  Sensory: normal and symmetric to light touch,  Coordination: finger-nose-finger, heel-to-shin bilaterally, no dysmetria Gait and Station: Rising up from seated position without assistance, normal stance,  moderate stride, no difficulty with turns DIAGNOSTIC DATA (LABS, IMAGING, TESTING) - I reviewed patient records, labs, notes, testing and imaging myself where available.  Lab Results  Component Value Date   WBC 6.0 11/20/2017   HGB 14.3 11/20/2017   HCT 43.0 11/20/2017   MCV 89.0 11/20/2017   PLT 200 11/20/2017      Component Value Date/Time   NA 140 11/20/2017 1101   K 4.2 11/20/2017 1101   CL 104 11/20/2017 1101   CO2 28 11/20/2017 1101   GLUCOSE 218 (H) 11/20/2017 1101   BUN 11 11/20/2017 1101   CREATININE 1.34 (H) 11/20/2017 1101   CALCIUM 9.8 11/20/2017 1101   PROT 6.5 11/20/2017 1101   ALBUMIN 4.2 04/24/2017 1153   AST 68 (H) 11/20/2017 1101   ALT 70 (H) 11/20/2017 1101   ALKPHOS 49 04/24/2017 1153   BILITOT 0.8 11/20/2017 1101   GFRNONAA 54 (L) 11/20/2017 1101   GFRAA 62 11/20/2017 1101   Lab Results  Component Value Date   CHOL 157 11/20/2017   HDL 43 11/20/2017   LDLCALC 85 11/20/2017   TRIG 197 (H)  11/20/2017   CHOLHDL 3.7 11/20/2017   Lab Results  Component Value Date   HGBA1C 6.3 (H) 11/20/2017    Lab Results   Component Value Date   TSH 2.22 11/20/2017      ASSESSMENT AND PLAN  72 y.o. year old male  has a past medical history of BPH (benign prostatic hyperplasia), Elevated hemoglobin A1c, Fibromyalgia, GERD (gastroesophageal reflux disease), Hyperlipidemia, Hypertension, Hypogonadism male, IBS (irritable bowel syndrome), and OSA (obstructive sleep apnea). here to follow-up for obstructive sleep apnea ,  CPAP compliance.Data dated 11/15/2017 -4/ 10 2019 shows compliance greater than 4 hours at 63% which is better than when last seen at 50%.  Average usage 5 hours 39 minutes.  Set pressure of 11 cm EPR level 2 AHI 2.3..   The patient  is a current patient of Dr. Brett Fairy  who is out of the office today . This note is sent to the work in doctor.       CPAP compliance greater than 4 hours at 63% reviewed data with patient, he has seasonal allergies and feels to stuffy some nights to use his CPAP Needs to be greater than 70% per Medicare guidelines Continue same settings Follow-up in 4 months for compliance check Dennie Bible, [facility], [facility], APRN  [facility] [address] [place], [place] [address] [phone]

## 2018-01-18 ENCOUNTER — Encounter: Payer: Self-pay | Admitting: Nurse Practitioner

## 2018-01-18 ENCOUNTER — Ambulatory Visit (INDEPENDENT_AMBULATORY_CARE_PROVIDER_SITE_OTHER): Payer: Medicare Other | Admitting: Nurse Practitioner

## 2018-01-18 VITALS — BP 135/74 | HR 59 | Ht 73.0 in | Wt 228.8 lb

## 2018-01-18 DIAGNOSIS — Z9989 Dependence on other enabling machines and devices: Secondary | ICD-10-CM

## 2018-01-18 DIAGNOSIS — G4733 Obstructive sleep apnea (adult) (pediatric): Secondary | ICD-10-CM

## 2018-01-18 NOTE — Patient Instructions (Signed)
CPAP compliance greater than 4 hours at 63% Needs to be greater than 70% per Medicare guidelines Continue same settings Follow-up in 4 months for compliance check

## 2018-01-19 DIAGNOSIS — F331 Major depressive disorder, recurrent, moderate: Secondary | ICD-10-CM | POA: Diagnosis not present

## 2018-01-20 NOTE — Progress Notes (Signed)
I have reviewed and agreed above plan. 

## 2018-01-25 DIAGNOSIS — F331 Major depressive disorder, recurrent, moderate: Secondary | ICD-10-CM | POA: Diagnosis not present

## 2018-02-01 DIAGNOSIS — F331 Major depressive disorder, recurrent, moderate: Secondary | ICD-10-CM | POA: Diagnosis not present

## 2018-02-08 DIAGNOSIS — F331 Major depressive disorder, recurrent, moderate: Secondary | ICD-10-CM | POA: Diagnosis not present

## 2018-02-09 DIAGNOSIS — F331 Major depressive disorder, recurrent, moderate: Secondary | ICD-10-CM | POA: Diagnosis not present

## 2018-02-15 DIAGNOSIS — F331 Major depressive disorder, recurrent, moderate: Secondary | ICD-10-CM | POA: Diagnosis not present

## 2018-02-22 DIAGNOSIS — F331 Major depressive disorder, recurrent, moderate: Secondary | ICD-10-CM | POA: Diagnosis not present

## 2018-03-03 DIAGNOSIS — F331 Major depressive disorder, recurrent, moderate: Secondary | ICD-10-CM | POA: Diagnosis not present

## 2018-03-07 ENCOUNTER — Other Ambulatory Visit: Payer: Self-pay | Admitting: Physician Assistant

## 2018-03-09 DIAGNOSIS — F331 Major depressive disorder, recurrent, moderate: Secondary | ICD-10-CM | POA: Diagnosis not present

## 2018-03-11 NOTE — Progress Notes (Signed)
FOLLOW UP  Assessment and Plan:   Hypertension Well controlled with current medications  Monitor blood pressure at home; patient to call if consistently greater than 130/80 Continue DASH diet.   Reminder to go to the ER if any CP, SOB, nausea, dizziness, severe HA, changes vision/speech, left arm numbness and tingling and jaw pain.  Cholesterol Currently at LDL goal; continue statin; diet discussed for trigs Continue low cholesterol diet and exercise.  Check lipid panel.   Prediabetes Continue diet and exercise.  Perform daily foot/skin check, notify office of any concerning changes.  Check A1C  Obesity with co morbidities Long discussion about weight loss, diet, and exercise Recommended diet heavy in fruits and veggies and low in animal meats, cheeses, and dairy products, appropriate calorie intake Discussed ideal weight for height Will follow up in 3 months    Vitamin D Def Approaching goal at last visit; he did not increase dose for goal of 70-100 Check Vit D level  Continue diet and meds as discussed. Further disposition pending results of labs. Discussed med's effects and SE's.   Over 30 minutes of exam, counseling, chart review, and critical decision making was performed.   Future Appointments  Date Time Provider [place]  05/21/2018  8:00 AM Dennie Bible, NP [facility] None  06/18/2018  9:30 AM [name], MD [facility] None  12/17/2018  9:00 AM [name], MD [facility] None    ----------------------------------------------------------------------------------------------------------------------  HPI 72 y.o. male  presents for 3 month follow up on hypertension, cholesterol, prediabetes, obesity and vitamin D deficiency.   he has a diagnosis of depression and is currently on zoloft 100 mg daily, reports symptoms are well controlled on current regimen. He is followed by [facility] Dr. Clovis Pu for management. he currently uses ativan  variably, some weeks uses daily, but may go 1-2 weeks without using.   BMI is Body mass index is 30.08 kg/m., he has been working on diet and exercise (walks all day while at work).  Wt Readings from Last 3 Encounters:  03/12/18 228 lb (103.4 kg)  01/18/18 228 lb 12.8 oz (103.8 kg)  11/20/17 229 lb 3.2 oz (104 kg)   His blood pressure has been controlled at home, today their BP is BP: 122/64  He does not workout. He denies chest pain, shortness of breath, dizziness.   He is on cholesterol medication (rosuvastain 20 mg every other day) and denies myalgias. His LDL cholesterol is at goal. The cholesterol last visit was:   Lab Results  Component Value Date   CHOL 157 11/20/2017   HDL 43 11/20/2017   LDLCALC 85 11/20/2017   TRIG 197 (H) 11/20/2017   CHOLHDL 3.7 11/20/2017    He has been working on diet and exercise for prediabetes, and denies foot ulcerations, increased appetite, nausea, paresthesia of the feet, polydipsia, polyuria, visual disturbances, vomiting and weight loss. Last A1C in the office was:  Lab Results  Component Value Date   HGBA1C 6.3 (H) 11/20/2017   Patient is on Vitamin D supplement.   Lab Results  Component Value Date   VD25OH 50 11/20/2017        Current Medications:  Current Outpatient Medications on File Prior to Visit  Medication Sig  . aspirin EC 81 MG tablet Take 81 mg by mouth daily.  . enalapril (VASOTEC) 20 MG tablet TAKE 1 TABLET BY MOUTH  EVERY DAY FOR BLOOD  PRESSURE  . Flaxseed, Linseed, (FLAX SEED OIL) 1000 MG CAPS Take 2 capsules by  mouth daily.   Marland Kitchen LORazepam (ATIVAN) 0.5 MG tablet   . Magnesium 500 MG TABS Take by mouth daily.  Marland Kitchen OVER THE COUNTER MEDICATION 5,000 Units.   . rosuvastatin (CRESTOR) 40 MG tablet TAKE 1 TABLET BY MOUTH  DAILY FOR CHOLESTEROL  . sertraline (ZOLOFT) 100 MG tablet Take 100 mg by mouth daily.   No current facility-administered medications on file prior to visit.      Allergies:  Allergies  Allergen  Reactions  . Asa [Aspirin]   . Atorvastatin     Muscle aches  . Celebrex [Celecoxib]   . Codeine   . Penicillins   . Savella [Milnacipran Hcl]   . Viagra [Sildenafil Citrate] Other (See Comments)    Headache     Medical History:  Past Medical History:  Diagnosis Date  . BPH (benign prostatic hyperplasia)   . Elevated hemoglobin A1c   . Fibromyalgia   . GERD (gastroesophageal reflux disease)   . Hyperlipidemia   . Hypertension   . Hypogonadism male   . IBS (irritable bowel syndrome)   . OSA (obstructive sleep apnea)    Family history- Reviewed and unchanged Social history- Reviewed and unchanged   Review of Systems:  Review of Systems  Constitutional: Negative for malaise/fatigue and weight loss.  HENT: Negative for hearing loss and tinnitus.   Eyes: Negative for blurred vision and double vision.  Respiratory: Negative for cough, shortness of breath and wheezing.   Cardiovascular: Negative for chest pain, palpitations, orthopnea, claudication and leg swelling.  Gastrointestinal: Negative for abdominal pain, blood in stool, constipation, diarrhea, heartburn, melena, nausea and vomiting.  Genitourinary: Negative.   Musculoskeletal: Negative for joint pain and myalgias.  Skin: Negative for rash.  Neurological: Negative for dizziness, tingling, sensory change, weakness and headaches.  Endo/Heme/Allergies: Negative for polydipsia.  Psychiatric/Behavioral: Negative for depression, substance abuse and suicidal ideas. The patient is nervous/anxious.   All other systems reviewed and are negative.    Physical Exam: BP 122/64   Pulse (!) 55   Temp 98.1 F (36.7 C)   Ht 6\' 1"  (1.854 m)   Wt 228 lb (103.4 kg)   SpO2 97%   BMI 30.08 kg/m  Wt Readings from Last 3 Encounters:  03/12/18 228 lb (103.4 kg)  01/18/18 228 lb 12.8 oz (103.8 kg)  11/20/17 229 lb 3.2 oz (104 kg)   General Appearance: Well nourished, in no apparent distress. Eyes: PERRLA, EOMs, conjunctiva no  swelling or erythema Sinuses: No Frontal/maxillary tenderness ENT/Mouth: Ext aud canals clear, TMs without erythema, bulging. No erythema, swelling, or exudate on post pharynx.  Tonsils not swollen or erythematous. Hearing normal.  Neck: Supple, thyroid normal.  Respiratory: Respiratory effort normal, BS equal bilaterally without rales, rhonchi, wheezing or stridor.  Cardio: RRR with no MRGs. Brisk peripheral pulses without edema.  Abdomen: Soft, + BS.  Non tender, no guarding, rebound, hernias, masses. Lymphatics: Non tender without lymphadenopathy.  Musculoskeletal: Full ROM, 5/5 strength, Normal gait Skin: Warm, dry without rashes, lesions, ecchymosis.  Neuro: Cranial nerves intact. No cerebellar symptoms.  Psych: Awake and oriented X 3, normal affect, normal speech, fidgety, Insight and Judgment appropriate.    Izora Ribas, NP 10:11 AM [facility]

## 2018-03-12 ENCOUNTER — Ambulatory Visit (INDEPENDENT_AMBULATORY_CARE_PROVIDER_SITE_OTHER): Payer: Medicare Other | Admitting: Adult Health

## 2018-03-12 ENCOUNTER — Encounter: Payer: Self-pay | Admitting: Adult Health

## 2018-03-12 VITALS — BP 122/64 | HR 55 | Temp 98.1°F | Ht 73.0 in | Wt 228.0 lb

## 2018-03-12 DIAGNOSIS — E669 Obesity, unspecified: Secondary | ICD-10-CM | POA: Diagnosis not present

## 2018-03-12 DIAGNOSIS — M1712 Unilateral primary osteoarthritis, left knee: Secondary | ICD-10-CM | POA: Diagnosis not present

## 2018-03-12 DIAGNOSIS — E559 Vitamin D deficiency, unspecified: Secondary | ICD-10-CM | POA: Diagnosis not present

## 2018-03-12 DIAGNOSIS — F325 Major depressive disorder, single episode, in full remission: Secondary | ICD-10-CM

## 2018-03-12 DIAGNOSIS — Z79899 Other long term (current) drug therapy: Secondary | ICD-10-CM | POA: Diagnosis not present

## 2018-03-12 DIAGNOSIS — E782 Mixed hyperlipidemia: Secondary | ICD-10-CM | POA: Diagnosis not present

## 2018-03-12 DIAGNOSIS — G8929 Other chronic pain: Secondary | ICD-10-CM | POA: Insufficient documentation

## 2018-03-12 DIAGNOSIS — I10 Essential (primary) hypertension: Secondary | ICD-10-CM

## 2018-03-12 DIAGNOSIS — R7303 Prediabetes: Secondary | ICD-10-CM

## 2018-03-12 DIAGNOSIS — M545 Low back pain: Secondary | ICD-10-CM

## 2018-03-12 NOTE — Patient Instructions (Signed)
7 easy rules for long life  Aim for 7+ servings of fruits and vegetables daily  80+ fluid ounces of water or unsweet tea for healthy kidneys  Limit alcohol intake, avoid smoking  Limit animal fats in diet for cholesterol and heart health - choose grass fed whenever available  Aim for low stress - take time to unwind and care for your mental health  Aim for 150 min of moderate intensity exercise weekly for heart health, and weights twice weekly for bone health  Aim for 7-9 hours of sleep daily      Preventing Type 2 Diabetes Mellitus Type 2 diabetes (type 2 diabetes mellitus) is a long-term (chronic) disease that affects blood sugar (glucose) levels. Normally, a hormone called insulin allows glucose to enter cells in the body. The cells use glucose for energy. In type 2 diabetes, one or both of these problems may be present:  The body does not make enough insulin.  The body does not respond properly to insulin that it makes (insulin resistance).  Insulin resistance or lack of insulin causes excess glucose to build up in the blood instead of going into cells. As a result, high blood glucose (hyperglycemia) develops, which can cause many complications. Being overweight or obese and having an inactive (sedentary) lifestyle can increase your risk for diabetes. Type 2 diabetes can be delayed or prevented by making certain nutrition and lifestyle changes. What nutrition changes can be made?  Eat healthy meals and snacks regularly. Keep a healthy snack with you for when you get hungry between meals, such as fruit or a handful of nuts.  Eat lean meats and proteins that are low in saturated fats, such as chicken, fish, egg whites, and beans. Avoid processed meats.  Eat plenty of fruits and vegetables and plenty of grains that have not been processed (whole grains). It is recommended that you eat: ? 1?2 cups of fruit every day. ? 2?3 cups of vegetables every day. ? 6?8 oz of whole grains  every day, such as oats, whole wheat, bulgur, brown rice, quinoa, and millet.  Eat low-fat dairy products, such as milk, yogurt, and cheese.  Eat foods that contain healthy fats, such as nuts, avocado, olive oil, and canola oil.  Drink water throughout the day. Avoid drinks that contain added sugar, such as soda or sweet tea.  Follow instructions from your health care provider about specific eating or drinking restrictions.  Control how much food you eat at a time (portion size). ? Check food labels to find out the serving sizes of foods. ? Use a kitchen scale to weigh amounts of foods.  Saute or steam food instead of frying it. Cook with water or broth instead of oils or butter.  Limit your intake of: ? Salt (sodium). Have no more than 1 tsp (2,400 mg) of sodium a day. If you have heart disease or high blood pressure, have less than ? tsp (1,500 mg) of sodium a day. ? Saturated fat. This is fat that is solid at room temperature, such as butter or fat on meat. What lifestyle changes can be made?  Activity  Do moderate-intensity physical activity for at least 30 minutes on at least 5 days of the week, or as much as told by your health care provider.  Ask your health care provider what activities are safe for you. A mix of physical activities may be best, such as walking, swimming, cycling, and strength training.  Try to add physical activity into your  day. For example: ? Park in spots that are farther away than usual, so that you walk more. For example, park in a far corner of the parking lot when you go to the office or the grocery store. ? Take a walk during your lunch break. ? Use stairs instead of elevators or escalators. Weight Loss  Lose weight as directed. Your health care provider can determine how much weight loss is best for you and can help you lose weight safely.  If you are overweight or obese, you may be instructed to lose at least 5?7 % of your body weight. Alcohol  and Tobacco   Limit alcohol intake to no more than 1 drink a day for nonpregnant women and 2 drinks a day for men. One drink equals 12 oz of beer, 5 oz of wine, or 1 oz of hard liquor.  Do not use any tobacco products, such as cigarettes, chewing tobacco, and e-cigarettes. If you need help quitting, ask your health care provider. Work With [place] Provider  Have your blood glucose tested regularly, as told by your health care provider.  Discuss your risk factors and how you can reduce your risk for diabetes.  Get screening tests as told by your health care provider. You may have screening tests regularly, especially if you have certain risk factors for type 2 diabetes.  Make an appointment with a diet and nutrition specialist (registered dietitian). A registered dietitian can help you make a healthy eating plan and can help you understand portion sizes and food labels. Why are these changes important?  It is possible to prevent or delay type 2 diabetes and related health problems by making lifestyle and nutrition changes.  It can be difficult to recognize signs of type 2 diabetes. The best way to avoid possible damage to your body is to take actions to prevent the disease before you develop symptoms. What can happen if changes are not made?  Your blood glucose levels may keep increasing. Having high blood glucose for a long time is dangerous. Too much glucose in your blood can damage your blood vessels, heart, kidneys, nerves, and eyes.  You may develop prediabetes or type 2 diabetes. Type 2 diabetes can lead to many chronic health problems and complications, such as: ? Heart disease. ? Stroke. ? Blindness. ? Kidney disease. ? Depression. ? Poor circulation in the feet and legs, which could lead to surgical removal (amputation) in severe cases. Where to find support:  Ask your health care provider to recommend a registered dietitian, diabetes educator, or weight loss  program.  Look for local or online weight loss groups.  Join a gym, fitness club, or outdoor activity group, such as a walking club. Where to find more information: To learn more about diabetes and diabetes prevention, visit:  American Diabetes Association (ADA): www.diabetes.[employer] of Diabetes and Digestive and Kidney Diseases: FindSpin.nl  To learn more about healthy eating, visit:  The U.S. Department of Agriculture Scientist, research (physical sciences)), Choose My Plate: http://wiley-williams.com/  Office of Disease Prevention and Health Promotion (ODPHP), Dietary Guidelines: SurferLive.at  Summary  You can reduce your risk for type 2 diabetes by increasing your physical activity, eating healthy foods, and losing weight as directed.  Talk with your health care provider about your risk for type 2 diabetes. Ask about any blood tests or screening tests that you need to have. This information is not intended to replace advice given to you by your health care provider. Make sure  you discuss any questions you have with your health care provider. Document Released: 01/14/2016 Document Revised: 02/28/2016 Document Reviewed: 11/13/2015 Elsevier Interactive Patient Education  [employer].

## 2018-03-13 LAB — CBC WITH DIFFERENTIAL/PLATELET
BASOS ABS: 19 {cells}/uL (ref 0–200)
Basophils Relative: 0.4 %
EOS ABS: 120 {cells}/uL (ref 15–500)
Eosinophils Relative: 2.5 %
HEMATOCRIT: 43.6 % (ref 38.5–50.0)
HEMOGLOBIN: 14.7 g/dL (ref 13.2–17.1)
Lymphs Abs: 1915 cells/uL (ref 850–3900)
MCH: 29.7 pg (ref 27.0–33.0)
MCHC: 33.7 g/dL (ref 32.0–36.0)
MCV: 88.1 fL (ref 80.0–100.0)
MONOS PCT: 8.1 %
MPV: 11.3 fL (ref 7.5–12.5)
NEUTROS ABS: 2357 {cells}/uL (ref 1500–7800)
Neutrophils Relative %: 49.1 %
Platelets: 185 10*3/uL (ref 140–400)
RBC: 4.95 10*6/uL (ref 4.20–5.80)
RDW: 13.2 % (ref 11.0–15.0)
Total Lymphocyte: 39.9 %
WBC: 4.8 10*3/uL (ref 3.8–10.8)
WBCMIX: 389 {cells}/uL (ref 200–950)

## 2018-03-13 LAB — TSH: TSH: 1.4 m[IU]/L (ref 0.40–4.50)

## 2018-03-13 LAB — COMPLETE METABOLIC PANEL WITH GFR
AG Ratio: 1.9 (calc) (ref 1.0–2.5)
ALKALINE PHOSPHATASE (APISO): 63 U/L (ref 40–115)
ALT: 18 U/L (ref 9–46)
AST: 29 U/L (ref 10–35)
Albumin: 4.4 g/dL (ref 3.6–5.1)
BUN/Creatinine Ratio: 6 (calc) (ref 6–22)
BUN: 9 mg/dL (ref 7–25)
CO2: 27 mmol/L (ref 20–32)
CREATININE: 1.46 mg/dL — AB (ref 0.70–1.25)
Calcium: 9.5 mg/dL (ref 8.6–10.3)
Chloride: 103 mmol/L (ref 98–110)
GFR, Est African American: 56 mL/min/{1.73_m2} — ABNORMAL LOW (ref >=60)
GFR, Est Non African American: 48 mL/min/{1.73_m2} — ABNORMAL LOW (ref >=60)
Globulin: 2.3 g/dL (calc) (ref 1.9–3.7)
Glucose, Bld: 179 mg/dL — ABNORMAL HIGH (ref 65–99)
Potassium: 4.1 mmol/L (ref 3.5–5.3)
Sodium: 140 mmol/L (ref 135–146)
Total Bilirubin: 0.9 mg/dL (ref 0.2–1.2)
Total Protein: 6.7 g/dL (ref 6.1–8.1)

## 2018-03-13 LAB — LIPID PANEL
CHOL/HDL RATIO: 3.8 (calc) (ref <5.0)
CHOLESTEROL: 141 mg/dL (ref <200)
HDL: 37 mg/dL — ABNORMAL LOW (ref >40)
LDL Cholesterol (Calc): 78 mg/dL (calc)
NON-HDL CHOLESTEROL (CALC): 104 mg/dL (ref <130)
Triglycerides: 154 mg/dL — ABNORMAL HIGH (ref <150)

## 2018-03-13 LAB — HEMOGLOBIN A1C
Hgb A1c MFr Bld: 6.4 % of total Hgb — ABNORMAL HIGH (ref <5.7)
MEAN PLASMA GLUCOSE: 137 (calc)
eAG (mmol/L): 7.6 (calc)

## 2018-03-16 DIAGNOSIS — F331 Major depressive disorder, recurrent, moderate: Secondary | ICD-10-CM | POA: Diagnosis not present

## 2018-03-23 DIAGNOSIS — F331 Major depressive disorder, recurrent, moderate: Secondary | ICD-10-CM | POA: Diagnosis not present

## 2018-03-30 DIAGNOSIS — F331 Major depressive disorder, recurrent, moderate: Secondary | ICD-10-CM | POA: Diagnosis not present

## 2018-04-13 DIAGNOSIS — F331 Major depressive disorder, recurrent, moderate: Secondary | ICD-10-CM | POA: Diagnosis not present

## 2018-04-23 DIAGNOSIS — F331 Major depressive disorder, recurrent, moderate: Secondary | ICD-10-CM | POA: Diagnosis not present

## 2018-04-26 DIAGNOSIS — F331 Major depressive disorder, recurrent, moderate: Secondary | ICD-10-CM | POA: Diagnosis not present

## 2018-05-03 DIAGNOSIS — F331 Major depressive disorder, recurrent, moderate: Secondary | ICD-10-CM | POA: Diagnosis not present

## 2018-05-04 ENCOUNTER — Encounter: Payer: Self-pay | Admitting: Nurse Practitioner

## 2018-05-04 DIAGNOSIS — F331 Major depressive disorder, recurrent, moderate: Secondary | ICD-10-CM | POA: Diagnosis not present

## 2018-05-10 DIAGNOSIS — F331 Major depressive disorder, recurrent, moderate: Secondary | ICD-10-CM | POA: Diagnosis not present

## 2018-05-18 DIAGNOSIS — F331 Major depressive disorder, recurrent, moderate: Secondary | ICD-10-CM | POA: Diagnosis not present

## 2018-05-21 ENCOUNTER — Ambulatory Visit: Payer: Medicare Other | Admitting: Nurse Practitioner

## 2018-05-24 DIAGNOSIS — F331 Major depressive disorder, recurrent, moderate: Secondary | ICD-10-CM | POA: Diagnosis not present

## 2018-05-31 DIAGNOSIS — F331 Major depressive disorder, recurrent, moderate: Secondary | ICD-10-CM | POA: Diagnosis not present

## 2018-06-08 DIAGNOSIS — F331 Major depressive disorder, recurrent, moderate: Secondary | ICD-10-CM | POA: Diagnosis not present

## 2018-06-15 DIAGNOSIS — F331 Major depressive disorder, recurrent, moderate: Secondary | ICD-10-CM | POA: Diagnosis not present

## 2018-06-18 ENCOUNTER — Ambulatory Visit (INDEPENDENT_AMBULATORY_CARE_PROVIDER_SITE_OTHER): Payer: Medicare Other | Admitting: Internal Medicine

## 2018-06-18 ENCOUNTER — Encounter: Payer: Self-pay | Admitting: Internal Medicine

## 2018-06-18 VITALS — BP 142/80 | HR 68 | Temp 97.5°F | Resp 18 | Ht 73.0 in | Wt 221.2 lb

## 2018-06-18 DIAGNOSIS — E1122 Type 2 diabetes mellitus with diabetic chronic kidney disease: Secondary | ICD-10-CM

## 2018-06-18 DIAGNOSIS — N183 Chronic kidney disease, stage 3 (moderate): Secondary | ICD-10-CM | POA: Diagnosis not present

## 2018-06-18 DIAGNOSIS — E782 Mixed hyperlipidemia: Secondary | ICD-10-CM | POA: Diagnosis not present

## 2018-06-18 DIAGNOSIS — E559 Vitamin D deficiency, unspecified: Secondary | ICD-10-CM | POA: Diagnosis not present

## 2018-06-18 DIAGNOSIS — Z79899 Other long term (current) drug therapy: Secondary | ICD-10-CM | POA: Diagnosis not present

## 2018-06-18 DIAGNOSIS — I10 Essential (primary) hypertension: Secondary | ICD-10-CM

## 2018-06-18 NOTE — Progress Notes (Signed)
This very nice 72 y.o. sep WM  presents for 6 month follow up with HTN, HLD, T2_NIDDM/CKD3 and Vitamin D Deficiency. Patient has OSA on CPAP followed by Dr Brett Fairy with reported improved restorative sleep.      Patient is treated for HTN (1993) & BP has been controlled at home. Today's BP is 142/80. Patient has had no complaints of any cardiac type chest pain, palpitations, dyspnea / orthopnea / PND, dizziness, claudication, or dependent edema.     Hyperlipidemia is controlled with diet & meds. Patient denies myalgias or other med SE's. 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  He is attempting control w/diet. Last A1c was not at goal:  Lab Results  Component Value Date   HGBA1C 6.3 (H) 06/18/2018      Further, the patient also has history of Vitamin D Deficiency ("29"/2008) and supplements vitamin D without any suspected side-effects. Last vitamin D was sl low (goal 70-100):   Lab Results  Component Value Date   VD25OH 47 06/18/2018   Current Outpatient Medications on File Prior to Visit  Medication Sig  . aspirin EC 81 MG tablet Take 81 mg by mouth daily.  . enalapril (VASOTEC) 20 MG tablet TAKE 1 TABLET BY MOUTH  EVERY DAY FOR BLOOD  PRESSURE  . Flaxseed, Linseed, (FLAX SEED OIL) 1000 MG CAPS Take 2 capsules by mouth daily.   . Magnesium 500 MG TABS Take by mouth daily.  Marland Kitchen OVER THE COUNTER MEDICATION 5,000 Units. Vitamin D  . rosuvastatin (CRESTOR) 40 MG tablet TAKE 1 TABLET BY MOUTH  DAILY FOR CHOLESTEROL  . sertraline (ZOLOFT) 100 MG tablet Take 100 mg by mouth daily.   No current facility-administered medications on file prior to visit.    Allergies  Allergen Reactions    . Asa [Aspirin]   . Atorvastatin     Muscle aches  . Celebrex [Celecoxib]   . Codeine   . Penicillins   . Savella [Milnacipran Hcl]   . Viagra [Sildenafil Citrate] Other (See Comments)    Headache   PMHx:   Past Medical History:  Diagnosis Date  . BPH (benign prostatic hyperplasia)   . Elevated hemoglobin A1c   . Fibromyalgia   . GERD (gastroesophageal reflux disease)   . Hyperlipidemia   . Hypertension   . Hypogonadism male   . IBS (irritable bowel syndrome)   . OSA (obstructive sleep apnea)    Immunization History  Administered Date(s) Administered  . Pneumococcal-Unspecified 07/16/2011  . Tdap 01/13/2010  . Zoster 08/09/2013   Past Surgical History:  Procedure Laterality Date  .  nasal smr np3  1985  . KNEE ARTHROSCOPY Left 1999  . SPINE SURGERY  2007   L5 S 1 Disk  . VASECTOMY  1983   FHx:    Reviewed / unchanged  SHx:    Reviewed / unchanged   Systems Review:  Constitutional: Denies fever, chills, wt changes, headaches, insomnia, fatigue, night sweats, change in appetite. Eyes: Denies redness, blurred vision, diplopia, discharge, itchy, watery eyes.  ENT: Denies discharge, congestion, post nasal drip, epistaxis, sore throat, earache, hearing loss, dental pain, tinnitus, vertigo,  sinus pain, snoring.  CV: Denies chest pain, palpitations, irregular heartbeat, syncope, dyspnea, diaphoresis, orthopnea, PND, claudication or edema. Respiratory: denies cough, dyspnea, DOE, pleurisy, hoarseness, laryngitis, wheezing.  Gastrointestinal: Denies dysphagia, odynophagia, heartburn, reflux, water brash, abdominal pain or cramps, nausea, vomiting, bloating, diarrhea, constipation, hematemesis, melena, hematochezia  or hemorrhoids. Genitourinary: Denies dysuria, frequency, urgency, nocturia, hesitancy, discharge, hematuria or flank pain. Musculoskeletal: Denies arthralgias, myalgias, stiffness, jt. swelling, pain, limping or strain/sprain.  Skin: Denies pruritus, rash,  hives, warts, acne, eczema or change in skin lesion(s). Neuro: No weakness, tremor, incoordination, spasms, paresthesia or pain. Psychiatric: Denies confusion, memory loss or sensory loss. Endo: Denies change in weight, skin or hair change.  Heme/Lymph: No excessive bleeding, bruising or enlarged lymph nodes.  Physical Exam  BP (!) 142/80   Pulse 68   Temp (!) 97.5 F (36.4 C)   Resp 18   Ht 6\' 1"  (1.854 m)   Wt 221 lb 3.2 oz (100.3 kg)   BMI 29.18 kg/m   Appears  well nourished, well groomed  and in no distress.  Eyes: PERRLA, EOMs, conjunctiva no swelling or erythema. Sinuses: No frontal/maxillary tenderness ENT/Mouth: EAC's clear, TM's nl w/o erythema, bulging. Nares clear w/o erythema, swelling, exudates. Oropharynx clear without erythema or exudates. Oral hygiene is good. Tongue normal, non obstructing. Hearing intact.  Neck: Supple. Thyroid not palpable. Car 2+/2+ without bruits, nodes or JVD. Chest: Respirations nl with BS clear & equal w/o rales, rhonchi, wheezing or stridor.  Cor: Heart sounds normal w/ regular rate and rhythm without sig. murmurs, gallops, clicks or rubs. Peripheral pulses normal and equal  without edema.  Abdomen: Soft & bowel sounds normal. Non-tender w/o guarding, rebound, hernias, masses or organomegaly.  Lymphatics: Unremarkable.  Musculoskeletal: Full ROM all peripheral extremities, joint stability, 5/5 strength and normal gait.  Skin: Warm, dry without exposed rashes, lesions or ecchymosis apparent.  Neuro: Cranial nerves intact, reflexes equal bilaterally. Sensory-motor testing grossly intact. Tendon reflexes grossly intact.  Pysch: Alert & oriented x 3.  Insight and judgement nl & appropriate. No ideations.  Assessment and Plan:  1. Essential hypertension  - Continue medication, monitor blood pressure at home.  - Continue DASH diet.  Reminder to go to the ER if any CP,  SOB, nausea, dizziness, severe HA, changes vision/speech.  - CBC with  Differential/Platelet - COMPLETE METABOLIC PANEL WITH GFR - Magnesium - TSH  2. Hyperlipidemia, mixed  - Continue diet/meds, exercise,& lifestyle modifications.  - Continue monitor periodic cholesterol/liver & renal functions   - Lipid panel - TSH  3. Type 2 diabetes mellitus with stage 3 chronic kidney disease, without long-term current use of insulin (HCC)  - Continue diet, exercise, lifestyle modifications.  - Monitor appropriate labs.  - Hemoglobin A1c - Insulin, random  4. Vitamin D deficiency  - Continue supplementation.  - VITAMIN D 25 Hydroxyl  5. Medication management  - CBC with Differential/Platelet - COMPLETE METABOLIC PANEL WITH GFR - Magnesium - Lipid panel - TSH - Hemoglobin A1c - Insulin, random - VITAMIN D 25 Hydroxyl       Discussed  regular exercise, BP monitoring, weight control to achieve/maintain BMI less than 25 and discussed med and SE's. Recommended labs to assess and monitor clinical status with further disposition pending results of labs. Over 30 minutes of exam, counseling, chart review was performed.

## 2018-06-18 NOTE — Patient Instructions (Signed)

## 2018-06-20 ENCOUNTER — Encounter: Payer: Self-pay | Admitting: Internal Medicine

## 2018-06-21 LAB — LIPID PANEL
Cholesterol: 145 mg/dL (ref <200)
HDL: 41 mg/dL (ref >40)
LDL Cholesterol (Calc): 82 mg/dL (calc)
Non-HDL Cholesterol (Calc): 104 mg/dL (calc) (ref <130)
Total CHOL/HDL Ratio: 3.5 (calc) (ref <5.0)
Triglycerides: 121 mg/dL (ref <150)

## 2018-06-21 LAB — MAGNESIUM: MAGNESIUM: 2.2 mg/dL (ref 1.5–2.5)

## 2018-06-21 LAB — COMPLETE METABOLIC PANEL WITH GFR
AG Ratio: 1.7 (calc) (ref 1.0–2.5)
ALBUMIN MSPROF: 4.2 g/dL (ref 3.6–5.1)
ALKALINE PHOSPHATASE (APISO): 74 U/L (ref 40–115)
ALT: 18 U/L (ref 9–46)
AST: 28 U/L (ref 10–35)
BILIRUBIN TOTAL: 0.9 mg/dL (ref 0.2–1.2)
BUN / CREAT RATIO: 7 (calc) (ref 6–22)
BUN: 10 mg/dL (ref 7–25)
CHLORIDE: 105 mmol/L (ref 98–110)
CO2: 27 mmol/L (ref 20–32)
Calcium: 9.8 mg/dL (ref 8.6–10.3)
Creat: 1.39 mg/dL — ABNORMAL HIGH (ref 0.70–1.25)
GFR, Est African American: 60 mL/min/{1.73_m2} (ref >=60)
GFR, Est Non African American: 51 mL/min/{1.73_m2} — ABNORMAL LOW (ref >=60)
GLUCOSE: 215 mg/dL — AB (ref 65–99)
Globulin: 2.5 g/dL (calc) (ref 1.9–3.7)
Potassium: 4 mmol/L (ref 3.5–5.3)
SODIUM: 142 mmol/L (ref 135–146)
Total Protein: 6.7 g/dL (ref 6.1–8.1)

## 2018-06-21 LAB — VITAMIN D 25 HYDROXY (VIT D DEFICIENCY, FRACTURES): VIT D 25 HYDROXY: 47 ng/mL (ref 30–100)

## 2018-06-21 LAB — CBC WITH DIFFERENTIAL/PLATELET
BASOS PCT: 0.4 %
Basophils Absolute: 18 cells/uL (ref 0–200)
EOS ABS: 131 {cells}/uL (ref 15–500)
Eosinophils Relative: 2.9 %
HCT: 44.9 % (ref 38.5–50.0)
HEMOGLOBIN: 14.6 g/dL (ref 13.2–17.1)
Lymphs Abs: 1449 cells/uL (ref 850–3900)
MCH: 28.8 pg (ref 27.0–33.0)
MCHC: 32.5 g/dL (ref 32.0–36.0)
MCV: 88.6 fL (ref 80.0–100.0)
MONOS PCT: 6 %
MPV: 11.3 fL (ref 7.5–12.5)
NEUTROS ABS: 2633 {cells}/uL (ref 1500–7800)
Neutrophils Relative %: 58.5 %
Platelets: 179 10*3/uL (ref 140–400)
RBC: 5.07 10*6/uL (ref 4.20–5.80)
RDW: 13.4 % (ref 11.0–15.0)
Total Lymphocyte: 32.2 %
WBC: 4.5 10*3/uL (ref 3.8–10.8)
WBCMIX: 270 {cells}/uL (ref 200–950)

## 2018-06-21 LAB — HEMOGLOBIN A1C
HEMOGLOBIN A1C: 6.3 %{Hb} — AB (ref <5.7)
MEAN PLASMA GLUCOSE: 134 (calc)
eAG (mmol/L): 7.4 (calc)

## 2018-06-21 LAB — TSH: TSH: 2.31 m[IU]/L (ref 0.40–4.50)

## 2018-06-21 LAB — INSULIN, RANDOM: INSULIN: 97.9 u[IU]/mL — AB (ref 2.0–19.6)

## 2018-06-22 DIAGNOSIS — F331 Major depressive disorder, recurrent, moderate: Secondary | ICD-10-CM | POA: Diagnosis not present

## 2018-06-29 DIAGNOSIS — F331 Major depressive disorder, recurrent, moderate: Secondary | ICD-10-CM | POA: Diagnosis not present

## 2018-07-05 ENCOUNTER — Ambulatory Visit: Payer: Self-pay | Admitting: Psychiatry

## 2018-07-06 ENCOUNTER — Ambulatory Visit (INDEPENDENT_AMBULATORY_CARE_PROVIDER_SITE_OTHER): Payer: Medicare Other | Admitting: Psychiatry

## 2018-07-06 DIAGNOSIS — F331 Major depressive disorder, recurrent, moderate: Secondary | ICD-10-CM | POA: Diagnosis not present

## 2018-07-06 NOTE — Progress Notes (Signed)
      [facility] Counselor/Therapist Progress Note   Patient ID: Cody Hines., MRN: [id number]  Date: 07/06/2018  Timespent: 60 minutes  Treatment Type: Individual  Subjective: Continued depressive symptomology due to ongoing personal and family stressors.   Interventions:Motivational Interviewing, Solution Focused, Strength-based, Psychosocial Skills: non-blaming communication, Supportive, Company secretary, Systems analyst and Reframing  Mental Status Exam:   Appearance:   Casual     Behavior:  Rigid and Blaming  Motor:  Normal  Speech/Language:   Clear and Coherent  Affect:  Appropriate  Mood:  anxious and depressed  Thought process:  Coherent  Thought content:    Logical  Perceptual disturbances:    Normal  Orientation:  Full (Time, Place, and Person)  Attention:  Good  Concentration:  good  Memory:  Immediate  Fund of knowledge:   Good  Insight:    Good  Judgment:   Good  Impulse Control:  good    Reported Symptoms: Anxiety, frustration, depression, interrupted sleep, proud of being alcohol-free for 1 year as of this month.  Risk Assessment: Danger to Self:  No Self-injurious Behavior: No Danger to Others: No Duty to Warn:no Physical Aggression / Violence:No  Access to Firearms a concern: No  Gang Involvement:No   Diagnosis:   ICD-10-CM   1. Major depressive disorder, recurrent episode, moderate degree ([place]) F33.1      Plan: Patient to work further on communication strategies reviewed in session and together we made written plan on communication skills he is to practice between sessions. Continue working on "leaving the Past in the Past" and staying current, realizing he can not change anything in past, and be determined to have positive influence over what happens in the present and future. Goal review with patient.  As of this month, patient has remained alcohol-free.  Return 1-2 weeks.  Shanon Ace,  LCSW

## 2018-07-12 ENCOUNTER — Ambulatory Visit (INDEPENDENT_AMBULATORY_CARE_PROVIDER_SITE_OTHER): Payer: Medicare Other | Admitting: Psychiatry

## 2018-07-12 DIAGNOSIS — F331 Major depressive disorder, recurrent, moderate: Secondary | ICD-10-CM

## 2018-07-12 NOTE — Progress Notes (Signed)
      [facility] Counselor/Therapist Progress Note   Patient ID: Cody Hines., MRN: [id number]  Date: 07/12/2018  Timespent: 60 minutes  Treatment Type: Individual  Subjective: Patient in today reporting some continued anxiety, depression, frustration, and anger related mostly to marital separation and ongoing negative communication.  Patient working on and struggling with improved listening and patience in any communication with separated wife.  Very difficult to let go of prior hurts in order to move forward.  Is making some progress in his anger management in situations especially not relating to separated wife. Continued to work on identifying triggers for his anger, anxiety, and depression and working on re-framing some of his negative thinking patterns.  Is making progress with some of these strategies.  Although flat affect today, overall, affect is more full and includes some occasional smiling and laughter.    Interventions:Solution Focused, Strength-based, Supportive and Reframing  Mental Status Exam:   Appearance:   Casual     Behavior:  Appropriate  Motor:  Normal  Speech/Language:   Normal Rate  Affect:  Flat  Mood:  anxious  Thought process:  Coherent  Thought content:    Logical  Perceptual disturbances:    Normal  Orientation:  Full (Time, Place, and Person)  Attention:  Good  Concentration:  good  Memory:  Immediate  Fund of knowledge:   Good  Insight:    Fair  Judgment:   Good  Impulse Control:  poor   Reported Symptoms: Frustrated, depression, stressed, anxious, happy to see his dog every day, some improvement in anger management, remains alcohol free  Risk Assessment: Danger to Self:  No Self-injurious Behavior: No Danger to Others: No Duty to Warn:no Physical Aggression / Violence:No  Access to Firearms a concern: No  Gang Involvement:No   Diagnosis:   ICD-10-CM   1. Major depressive disorder, recurrent episode, moderate (HCC) F33.1       Plan: Will see patient in 1 week for continued goal-directed treatment.  Shanon Ace, LCSW

## 2018-07-19 ENCOUNTER — Ambulatory Visit (INDEPENDENT_AMBULATORY_CARE_PROVIDER_SITE_OTHER): Payer: Medicare Other | Admitting: Psychiatry

## 2018-07-19 DIAGNOSIS — F331 Major depressive disorder, recurrent, moderate: Secondary | ICD-10-CM

## 2018-07-19 NOTE — Progress Notes (Signed)
      [facility] Counselor/Therapist Progress Note   Patient ID: Cody Worley., MRN: [id number]  Date: 07/19/2018  Timespent: 60 minutes  Treatment Type: Individual  Subjective: Patient in today, obviously more depressed and frustrated with separated wife.  More negativity today and difficult to focus on anything positive.  Several unproductive, conflictual contacts with separated wife that keeps leading to further anger and hurt.  Letting go remains very challenging for patient.  Later in session, was able to briefly look at some things in a bit more positive way.  Very difficult to not reach back into the past and bring up hurtful/negative incidents.  Processed several incidents that have contributed to his more depressed mood, and how some of his behavior might be making the situation harder for himself.  Encouraged patient to practice some of the communication skills we have reviewed, especially listening to really hear what someone is saying and then choosing to respond in more helpful ways versus responding with anger or negativity.  Supported the more positive relationships such as his adult children and grandchildren, his sisters and nieces/nephews, and a long time friend Cody Hines.  Denies any thoughts/plans to harm self nor others, adding that he knows that would not solve anything and would hurt others.  Will see again next week as patient scheduled for weekly appts ahead.  Interventions:Solution Focused, Strength-based and Supportive  Mental Status Exam:   Appearance:   Casual     Behavior:  Appropriate and Sharing  Motor:  Normal  Speech/Language:   Negative  Affect:  Negative, Depressed and Flat  Mood:  angry, depressed and irritable  Thought process:  Coherent  Thought content:    Negative  Perceptual disturbances:    Normal  Orientation:  Full (Time, Place, and Person)  Attention:  Good  Concentration:  good  Memory:  Immediate  Fund of knowledge:   Good  Insight:     Fair  Judgment:   Intact  Impulse Control:  good    Reported Symptoms: Irritable, depressed, angry, frustrated,  Risk Assessment: Danger to Self:  No Self-injurious Behavior: No Danger to Others: No Duty to Warn:no Physical Aggression / Violence:No  Access to Firearms a concern: No  Gang Involvement:No   Diagnosis:   ICD-10-CM   1. Major depressive disorder, recurrent episode, moderate (HCC) F33.1      Plan: Plan to see patient in 1 week to continue goal-directed treatment.  Shanon Ace, LCSW

## 2018-07-26 ENCOUNTER — Ambulatory Visit (INDEPENDENT_AMBULATORY_CARE_PROVIDER_SITE_OTHER): Payer: Medicare Other | Admitting: Psychiatry

## 2018-07-26 DIAGNOSIS — F331 Major depressive disorder, recurrent, moderate: Secondary | ICD-10-CM | POA: Diagnosis not present

## 2018-07-26 NOTE — Progress Notes (Signed)
      [facility] Counselor/Therapist Progress Note   Patient ID: Cody Hines., MRN: [id number]  Date: 07/26/2018  Timespent: 60 minutes  Treatment Type: Individual  Subjective:  Patient in today with depressed, negative mood, however not as depressed and negative as on some prior occasions.  Frustrations continue with separated wife and very difficult for patent to ignore hurtful comments from her.  Processed a lot of his frustration and importance of "letting go" in order to move forward.  Also worked more on stopping the tendency to look for what may go wrong versus right.  This is a long-term mindset for patient that is hard for him to make lasting changes, but does better in the short term. Patient mentioned possibly taking a short trip with a tour bus and this was encouraged. Goal review with progress noted. Goals reviewed and progress noted.    Interventions:Solution Focused, Strength-based, Supportive and Reframing  Mental Status Exam:   Appearance:   Casual     Behavior:  Appropriate and Sharing  Motor:  Normal  Speech/Language:   Normal Rate  Affect:  Flat  Mood:  congruent  Thought process:  Coherent  Thought content:    Logical  Perceptual disturbances:    Normal  Orientation:  Full (Time, Place, and Person)  Attention:  Good  Concentration:  good  Memory:  Immediate  Fund of knowledge:   Good  Insight:    Fair  Judgment:   Fair  Impulse Control:  good    Reported Symptoms: frustration, depression, anxiety, "waiting for next shoe to drop"  Risk Assessment: Danger to Self:  No Self-injurious Behavior: No Danger to Others: No Duty to Warn:no Physical Aggression / Violence:No  Access to Firearms a concern: No  Gang Involvement:No   Diagnosis:   ICD-10-CM   1. Major depressive disorder, recurrent episode, moderate (HCC) F33.1      Plan:  Will see patient in 1 week to continue goal-directed treatment.  Shanon Ace,  LCSW

## 2018-08-01 ENCOUNTER — Encounter: Payer: Self-pay | Admitting: Emergency Medicine

## 2018-08-02 ENCOUNTER — Ambulatory Visit (INDEPENDENT_AMBULATORY_CARE_PROVIDER_SITE_OTHER): Payer: Medicare Other | Admitting: Psychiatry

## 2018-08-02 DIAGNOSIS — F331 Major depressive disorder, recurrent, moderate: Secondary | ICD-10-CM | POA: Diagnosis not present

## 2018-08-02 NOTE — Progress Notes (Signed)
      [facility] Counselor/Therapist Progress Note   Patient ID: Cody Uzelac., MRN: [id number]  Date: 08/02/2018  Timespent: 50 minutes  Treatment Type: Individual  Subjective:   Patient in today feeling depressed and negative, however to a lesser degree than earlier sessions.  Still very easily affected by anything separated wife does or says, and easy to interpret things as being directed towards him.  Processed a lot of his feelings today and worked on the skill of "letting go" in order to move forward. Looked at how, with his personal history, this can understandably  be difficult for him.  Patient has made progress in moving further beyond his distant past, and continues to work on becoming healthier emotionally and physically as he lives into the future.  Has remained alcohol-free a year.   Interventions:Solution Focused, Supportive and Reframing  Mental Status Exam:   Appearance:   Casual     Behavior:  Appropriate and Sharing  Motor:  Normal  Speech/Language:   Normal Rate  Affect:  Negative  Mood:  depressed  Thought process:  Relevant  Thought content:    Negative  Perceptual disturbances:    Normal  Orientation:  Full (Time, Place, and Person)  Attention:  Good  Concentration:  good  Memory:  Immediate  Fund of knowledge:   Good  Insight:    Fair  Judgment:   Good  Impulse Control:  good    Reported Symptoms: some depression, some irritability, some ruminating to past  Risk Assessment: Danger to Self:  No Self-injurious Behavior: No Danger to Others: No Duty to Warn:no Physical Aggression / Violence:No  Access to Firearms a concern: No  Gang Involvement:No   Diagnosis:   ICD-10-CM   1. Major depressive disorder, recurrent episode, moderate (HCC) F33.1      Plan:  Will see patient next week to continue goal-directed treatment.   Shanon Ace, LCSW

## 2018-08-09 ENCOUNTER — Ambulatory Visit (INDEPENDENT_AMBULATORY_CARE_PROVIDER_SITE_OTHER): Payer: Medicare Other | Admitting: Psychiatry

## 2018-08-09 DIAGNOSIS — F331 Major depressive disorder, recurrent, moderate: Secondary | ICD-10-CM

## 2018-08-09 NOTE — Progress Notes (Signed)
      [facility] Counselor/Therapist Progress Note   Patient ID: Cody Hines., MRN: [id number]  Date: 08/09/2018  Timespent: 60 minutes   Treatment Type: Individual   Reported Symptoms: Fatigue, anxious, sad, depression   Mental Status Exam:    Appearance:   Casual     Behavior:  Appropriate and Sharing  Motor:  Normal  Speech/Language:   Normal Rate  Affect:  Negative and Congruent  Mood:  anxious and depressed  Thought process:  normal  Thought content:    WNL  Sensory/Perceptual disturbances:    WNL  Orientation:  oriented to person, place, time/date, situation, day of week, month of year and year  Attention:  Good  Concentration:  Good  Memory:  WNL  Fund of knowledge:   Good  Insight:    Fair  Judgment:   Good  Impulse Control:  Good     Risk Assessment: Danger to Self:  No Self-injurious Behavior: No Danger to Others: No Duty to Warn:no Physical Aggression / Violence:No  Access to Firearms a concern: No  Gang Involvement:No    Subjective:  Patient in today with symptoms of anxiety, depression, fatigue, and frustration however symptoms have subsided some.  Still having some tendency to look for what might go wrong, although it is decreasing some.  For the first time ever, patient said today that his past week was good.  Realized as he talked that he had some challenges during the week but was clearly working at looking at the bigger picture including some positive things that happened rather than focusing heavily on negatives.  Adult children planning to celebrate his birthday with him within a couple weeks.  Worked further on decreasing his depression and anxiety, focusing on known triggers including feeling unappreciated, demanding calls/texts from separated wife, feeling left out or "less than" within the family, and rainy and cold days when he's more cooped up and isolated.  Patient also has a long history of anxiety, depression, and negativity which  began years ago as his earlier years were very difficult and abusive at times. Goal review and progress noted. Patient continues to schedule 1-2 weeks and although avoidant at times, for the most part he pushes ahead to move forward.    Interventions: Solution-Oriented/Positive Psychology, Ego-Supportive and Insight-Oriented   Diagnosis:   ICD-10-CM   1. Major depressive disorder, recurrent episode, moderate ([place]) F33.1      Plan:  Plan to see patient next week to continue goal-directed treatment.   Shanon Ace, LCSW

## 2018-08-16 ENCOUNTER — Ambulatory Visit (INDEPENDENT_AMBULATORY_CARE_PROVIDER_SITE_OTHER): Payer: Medicare Other | Admitting: Psychiatry

## 2018-08-16 DIAGNOSIS — F331 Major depressive disorder, recurrent, moderate: Secondary | ICD-10-CM | POA: Diagnosis not present

## 2018-08-16 NOTE — Progress Notes (Signed)
      [facility] Counselor/Therapist Progress Note   Patient ID: Cody Hines., MRN: [id number]  Date: 08/16/2018  Timespent:  50 minutes   Treatment Type: Individual   Reported Symptoms: Fatigue and anxiety, some depression   Mental Status Exam:    Appearance:   Casual     Behavior:  Appropriate and Sharing  Motor:  Normal  Speech/Language:   Normal Rate  Affect:  Blunt  Mood:  anxious  Thought process:  normal  Thought content:    WNL  Sensory/Perceptual disturbances:    WNL  Orientation:  oriented to person, place, time/date, situation, day of week, month of year and year  Attention:  Good  Concentration:  Good  Memory:  WNL  Fund of knowledge:   Good  Insight:    Good  Judgment:   Good  Impulse Control:  Good     Risk Assessment: Danger to Self:  No Self-injurious Behavior: No Danger to Others: No Duty to Warn:no Physical Aggression / Violence:No  Access to Firearms a concern: No  Gang Involvement:No    Subjective:   Patient in today experiencing some anxiety and depression however over-all his symptoms have lessened.  He reports "Some days better, some not good.  Tendency to have negative outlook has lessened some also.  Discussed with patient the connection between his outlook and the number of better days he has, and he agreed.  Having that mindset in the moments of life is more difficult, especially when challenges arise.     Interventions: Solution-Oriented/Positive Psychology and Ego-Supportive   Diagnosis:   ICD-10-CM   1. Major depressive disorder, recurrent episode, moderate ([place]) F33.1      Plan: Plan to see patient next week to continue goal-directed treatment.     Shanon Ace, LCSW

## 2018-08-23 ENCOUNTER — Ambulatory Visit (INDEPENDENT_AMBULATORY_CARE_PROVIDER_SITE_OTHER): Payer: Medicare Other | Admitting: Psychiatry

## 2018-08-23 DIAGNOSIS — F331 Major depressive disorder, recurrent, moderate: Secondary | ICD-10-CM

## 2018-08-23 NOTE — Progress Notes (Signed)
      [facility] Counselor/Therapist Progress Note   Patient ID: Cody Hines., MRN: [id number]  Date: 08/23/2018  Timespent: 58 minutes   Treatment Type: Individual   Reported Symptoms: anxiety, depression ("some better")   Mental Status Exam:    Appearance:   Casual     Behavior:  Appropriate and Sharing  Motor:  Normal  Speech/Language:   Normal Rate  Affect:  Appropriate  Mood:  anxious and depressed  Thought process:  normal  Thought content:    WNL  Sensory/Perceptual disturbances:    WNL  Orientation:  oriented to person, place, time/date, situation, day of week, month of year and year  Attention:  Good  Concentration:  Good  Memory:  WNL  Fund of knowledge:   Good  Insight:    Fair  Judgment:   Good  Impulse Control:  Good     Risk Assessment: Danger to Self:  No Self-injurious Behavior: No Danger to Others: No Duty to Warn:no Physical Aggression / Violence:No  Access to Firearms a concern: No  Gang Involvement:No    Subjective: Patient reports "equal better days compared to bad days" which is progress for him. Looked at what helps make "a good day".  Realizes his thinking is more positive on days that are good.  Doesn't feel a lot of control in how he thinks so we worked on changing that outlook and patient being able to have some influence in changing how his day goes.  This is a challenge for patient and he agrees to work on in between sessions.  A little less resentment re: separated wife. Enjoyed an outing with son's family this weekend [place] [employer] [place].  Goal review and progress noted.    Interventions: Solution-Oriented/Positive Psychology and Ego-Supportive   Diagnosis:   ICD-10-CM   1. Major depressive disorder, recurrent episode, moderate ([facility]) F33.1      Plan: Patient to follow up on strategies discussed in session today re: changing his thought/behavioral patterns and overthinking to have more "better days".  It's a  challenge for patient to believe some changes in his thinking and behavior can make a difference in how his day goes, and is going to give it more effort.   Shanon Ace, LCSW

## 2018-08-24 ENCOUNTER — Ambulatory Visit (INDEPENDENT_AMBULATORY_CARE_PROVIDER_SITE_OTHER): Payer: Medicare Other | Admitting: Psychiatry

## 2018-08-24 ENCOUNTER — Encounter: Payer: Self-pay | Admitting: Psychiatry

## 2018-08-24 DIAGNOSIS — F411 Generalized anxiety disorder: Secondary | ICD-10-CM | POA: Diagnosis not present

## 2018-08-24 DIAGNOSIS — F6381 Intermittent explosive disorder: Secondary | ICD-10-CM

## 2018-08-24 DIAGNOSIS — F331 Major depressive disorder, recurrent, moderate: Secondary | ICD-10-CM | POA: Diagnosis not present

## 2018-08-24 MED ORDER — LORAZEPAM 1 MG PO TABS: 1.0000 mg | ORAL_TABLET | Freq: Every day | ORAL | 5 refills | Status: DC

## 2018-08-24 MED ORDER — SERTRALINE HCL 100 MG PO TABS: 200.0000 mg | ORAL_TABLET | Freq: Every day | ORAL | 1 refills | Status: DC

## 2018-08-24 NOTE — Progress Notes (Signed)
Slate Debroux [id number] 09-28-1946 72 y.o.  Subjective:   Patient ID:  Cody Hines. is a 72 y.o. (DOB 10/10/1945) male.  Chief Complaint:  Chief Complaint  Patient presents with  . Depression  . Fussy    HPI Cody Hines. presents to the office today for follow-up of above and feels somewhat better than last time.  Overall is improved but sx are not resolved. Pt reports that mood is Depressed and describes anxiety as Moderate. Anxiety symptoms include: Excessive Worry,. Anxiety in large groups of people has to get out.  Pt reports has interrupted sleep. Better sleep when not working. Goes to bed early. Usually enough sleep.  Pt reports that appetite is good. Pt reports that energy is no change and good. Better than it was.   Concentration is good. Suicidal thoughts:  denied by patient. Trying to stay active helps.  Less angry and resentful of others than he used to be. Outbursts are generally resolved.  Still separated and unlikely to reconcile. Able to control myself.  Review of Systems:  Review of Systems  Respiratory: Positive for cough.   Neurological: Positive for tremors and headaches. Negative for weakness.  Psychiatric/Behavioral: Positive for dysphoric mood. Negative for agitation, behavioral problems, confusion, decreased concentration, hallucinations, self-injury, sleep disturbance and suicidal ideas. The patient is nervous/anxious. The patient is not hyperactive.   Increase in migraine HA.  Medications: I have reviewed the patient's current medications.  Current Outpatient Medications  Medication Sig Dispense Refill  . aspirin EC 81 MG tablet Take 81 mg by mouth daily.    . enalapril (VASOTEC) 20 MG tablet TAKE 1 TABLET BY MOUTH  EVERY DAY FOR BLOOD  PRESSURE 120 tablet 0  . Flaxseed, Linseed, (FLAX SEED OIL) 1000 MG CAPS Take 2 capsules by mouth daily.     Marland Kitchen LORazepam (ATIVAN) 1 MG tablet Take 1 mg by mouth at bedtime.     . Magnesium 500 MG  TABS Take by mouth daily.    Marland Kitchen OVER THE COUNTER MEDICATION 5,000 Units. Vitamin D    . rosuvastatin (CRESTOR) 40 MG tablet TAKE 1 TABLET BY MOUTH  DAILY FOR CHOLESTEROL 90 tablet 1  . sertraline (ZOLOFT) 100 MG tablet Take 200 mg by mouth daily.      No current facility-administered medications for this visit.     Medication Side Effects: None  Allergies:  Allergies  Allergen Reactions  . Asa [Aspirin]   . Atorvastatin     Muscle aches  . Celebrex [Celecoxib]   . Codeine   . Penicillins   . Savella [Milnacipran Hcl]   . Viagra [Sildenafil Citrate] Other (See Comments)    Headache    Past Medical History:  Diagnosis Date  . BPH (benign prostatic hyperplasia)   . Elevated hemoglobin A1c   . Fibromyalgia   . GERD (gastroesophageal reflux disease)   . Hyperlipidemia   . Hypertension   . Hypogonadism male   . IBS (irritable bowel syndrome)   . OSA (obstructive sleep apnea)     Family History  Problem Relation Age of Onset  . Cancer Mother        breast  . Cancer Father        lung  . Diabetes Father   . Heart disease Sister   . Arthritis Sister     Social History   Socioeconomic History  . Marital status: Legally Separated    Spouse name: Kennyth Lose  . Number of children: Not  on file  . Years of education: Not on file  . Highest education level: Not on file  Occupational History  . 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 Financial resource strain: Not on file  . Food insecurity:    Worry: Not on file    Inability: Not on file  . Transportation needs:    Medical: Not on file    Non-medical: Not on file  Tobacco Use  . Smoking status: Former Smoker    Years: 30.00    Types: Cigarettes    Last attempt to quit: 10/06/1980    Years since quitting: 37.9  . Smokeless tobacco: Never Used  Substance and Sexual Activity  . Alcohol use: No    Frequency: Never    Comment: Not drinking x 1 month  . Drug use: No  . Sexual activity: Not Currently  Lifestyle  .  Physical activity:    Days per week: Not on file    Minutes per session: Not on file  . Stress: Not on file  Relationships  . Social connections:    Talks on phone: Not on file    Gets together: Not on file    Attends religious service: Not on file    Active member of club or organization: Not on file    Attends meetings of clubs or organizations: Not on file    Relationship status: Not on file  . Intimate partner violence:    Fear of current or ex partner: Not on file    Emotionally abused: Not on file    Physically abused: Not on file    Forced sexual activity: Not on file  Other Topics Concern  . Not on file  Social History Narrative  . Not on file    Past Medical History, Surgical history, Social history, and Family history were reviewed and updated as appropriate.   Please see review of systems for further details on the patient's review from today.   Objective:   Physical Exam:  There were no vitals taken for this visit.  Physical Exam  Constitutional: He is oriented to person, place, and time. He appears well-developed. No distress.  Musculoskeletal: He exhibits no deformity.  Neurological: He is alert and oriented to person, place, and time. He displays no tremor. Coordination and gait normal.  Psychiatric: His speech is normal and behavior is normal. Judgment and thought content normal. His mood appears not anxious. His affect is blunt. His affect is not angry, not labile and not inappropriate. Cognition and memory are normal. He does not exhibit a depressed mood. He expresses no homicidal and no suicidal ideation. He expresses no suicidal plans and no homicidal plans.  Insight intact. No auditory or visual hallucinations.  Overall less irritable and angry with people. He is attentive.    Lab Review:     Component Value Date/Time   NA 142 06/18/2018 0927   K 4.0 06/18/2018 0927   CL 105 06/18/2018 0927   CO2 27 06/18/2018 0927   GLUCOSE 215 (H) 06/18/2018  0927   BUN 10 06/18/2018 0927   CREATININE 1.39 (H) 06/18/2018 0927   CALCIUM 9.8 06/18/2018 0927   PROT 6.7 06/18/2018 0927   ALBUMIN 4.2 04/24/2017 1153   AST 28 06/18/2018 0927   ALT 18 06/18/2018 0927   ALKPHOS 49 04/24/2017 1153   BILITOT 0.9 06/18/2018 0927   GFRNONAA 51 (L) 06/18/2018 0927   GFRAA 60 06/18/2018 0927       Component Value  Date/Time   WBC 4.5 06/18/2018 0927   RBC 5.07 06/18/2018 0927   HGB 14.6 06/18/2018 0927   HCT 44.9 06/18/2018 0927   PLT 179 06/18/2018 0927   MCV 88.6 06/18/2018 0927   MCH 28.8 06/18/2018 0927   MCHC 32.5 06/18/2018 0927   RDW 13.4 06/18/2018 0927   LYMPHSABS 1,449 06/18/2018 0927   MONOABS 670 04/24/2017 1153   EOSABS 131 06/18/2018 0927   BASOSABS 18 06/18/2018 0927    No results found for: POCLITH, LITHIUM   No results found for: PHENYTOIN, PHENOBARB, VALPROATE, CBMZ   .res Assessment: Plan:    Major depressive disorder, recurrent episode, moderate (H[facility])  Generalized anxiety disorder  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 We discussed his chronic depression and anxiety and irritability, anger.  He's markedly better but has residual symptoms.  Benefit from meds and therapy. No change in therapy indicated.  Needs to continue the meds bc of a high relapse risk.  Supportive therapy re: dealing with stress of separation. Doesn't look like things will change.  We discussed the short-term risks associated with benzodiazepines including sedation and increased fall risk among others.  Discussed long-term side effect risk including dependence, potential withdrawal symptoms, and the potential eventual dose-related risk of dementia.   This appointment was 15 minutes  FU 4 mos.  Lynder Parents, MD, DFAPA   Please see After Visit Summary for patient specific instructions.  Future Appointments  Date Time Provider  [place]  09/07/2018  9:00 AM Shanon Ace, LCSW [facility] None  09/13/2018  8:00 AM Shanon Ace, LCSW [facility] None  09/17/2018 10:30 AM Liane Comber, NP [facility] None  09/20/2018  8:00 AM Shanon Ace, LCSW [facility] None  09/27/2018  8:00 AM Shanon Ace, LCSW [facility] None  11/02/2018  8:15 AM Dennie Bible, NP [facility] None  12/17/2018  9:00 AM [name], MD [facility] None    No orders of the defined types were placed in this encounter.     -------------------------------

## 2018-08-30 ENCOUNTER — Ambulatory Visit: Payer: Medicare Other | Admitting: Psychiatry

## 2018-08-30 ENCOUNTER — Ambulatory Visit (INDEPENDENT_AMBULATORY_CARE_PROVIDER_SITE_OTHER): Payer: Medicare Other | Admitting: Psychiatry

## 2018-08-30 DIAGNOSIS — F331 Major depressive disorder, recurrent, moderate: Secondary | ICD-10-CM | POA: Diagnosis not present

## 2018-08-30 NOTE — Progress Notes (Signed)
      [facility] Counselor/Therapist Progress Note  Patient ID: Boy Delamater., MRN: [id number],    Date: 08/30/2018  Time Spent: 58 minutes  Treatment Type: Individual Therapy  Reported Symptoms: Depressed mood, Anxious Mood, Fatigue and Sleep disturbance  Mental Status Exam:  Appearance:   Casual     Behavior:  Appropriate and Sharing  Motor:  Normal  Speech/Language:   Normal Rate  Affect:  anxious  Mood:  anxious and depressed  Thought process:  normal  Thought content:    WNL  Sensory/Perceptual disturbances:    WNL  Orientation:  oriented to person, place, time/date, situation, day of week, month of year and year  Attention:  Good  Concentration:  Good  Memory:  WNL  Fund of knowledge:   Good  Insight:    Fair  Judgment:   Good  Impulse Control:  Good   Risk Assessment: Danger to Self:  No Self-injurious Behavior: No Danger to Others: No Duty to Warn:no Physical Aggression / Violence:No  Access to Firearms a concern: No  Gang Involvement:No   Subjective:  Patient in today with some anxiety and depression, although still better than in earlier treatment. Has had lingering congestion and coughing past 2-3 wks.  That has contributed to his mood being lower and also having less of an appetite and difficulty sleeping well.  Encouraged patient to consider whether he may need to check in with primary care doctor regarding his continued issues with the congestion and coughing.  Emotionally, patient still better with some decrease in his negativity and less assuming" things will go wrong versus right".  Some smiling in sessions which also marks improvement for patient. Goal review noting progress.  Interventions: Solution-Oriented/Positive Psychology and Ego-Supportive  Diagnosis:   ICD-10-CM   1. Major depressive disorder, recurrent episode, moderate ([place]) F33.1     Plan:  Patient to consider seeing his PCP if his physical status does not improve soon.  Also to  work on countering some negativity about the holiday and family with more balanced, realistic thoughts.   Shanon Ace, LCSW

## 2018-09-07 ENCOUNTER — Ambulatory Visit (INDEPENDENT_AMBULATORY_CARE_PROVIDER_SITE_OTHER): Payer: Medicare Other | Admitting: Psychiatry

## 2018-09-07 DIAGNOSIS — F331 Major depressive disorder, recurrent, moderate: Secondary | ICD-10-CM

## 2018-09-07 DIAGNOSIS — C44321 Squamous cell carcinoma of skin of nose: Secondary | ICD-10-CM | POA: Diagnosis not present

## 2018-09-07 NOTE — Progress Notes (Signed)
      [facility] Counselor/Therapist Progress Note  Patient ID: Domanic Matusek., MRN: [id number],    Date: 09/07/2018  Time Spent: 56 minutes  Treatment Type: Individual Therapy  Reported Symptoms: Depressed mood, Anxious Mood, Ruminations and Fatigue  Mental Status Exam:  Appearance:   Casual     Behavior:  Appropriate and Sharing  Motor:  Normal  Speech/Language:   Normal Rate  Affect:  Congruent  Mood:  anxious and depressed  Thought process:  normal  Thought content:    WNL  Sensory/Perceptual disturbances:    WNL  Orientation:  oriented to person, place, time/date, situation, day of week, month of year and year  Attention:  Good  Concentration:  Fair  Memory:  Immediate;   Fair Recent;   [place] of knowledge:   Good  Insight:    Fair  Judgment:   Good  Impulse Control:  Good   Risk Assessment: Danger to Self:  No Self-injurious Behavior: No Danger to Others: No Duty to Warn:no Physical Aggression / Violence:No  Access to Firearms a concern: No  Gang Involvement:No   Subjective:   Patient in today with symptoms of fatigue, anxiety, depression, and some rumination. Continued work on negative assumptions and thought patterns.  Frequently states "I' wait for the next shoe to drop".  Did have visits from daughter, son, and grandkids on Thanksgiving.  Later went and visited his sister and other family.  Admits that the holiday went better than he had expected as this was second Thanksgiving since he's been living apart from wife. Reverts back to some earlier, more negative times within the family.  Listened and processed some, and then reinforced importance of his mindset and letting go more of negativity, as he has made some progress and "some days are better than others".  Reviewed self-care including mentally, physically, and emotionally.     Interventions: Solution-Oriented/Positive Psychology and Ego-Supportive  Diagnosis:   ICD-10-CM   1. Major depressive  disorder, recurrent episode, moderate ([place]) F33.1     Plan:   Patient to continue work on decreasing negative thoughts and assumptions, and maintaining better self care per our discussion today.  Shanon Ace, LCSW

## 2018-09-13 ENCOUNTER — Ambulatory Visit (INDEPENDENT_AMBULATORY_CARE_PROVIDER_SITE_OTHER): Payer: Medicare Other | Admitting: Psychiatry

## 2018-09-13 DIAGNOSIS — F331 Major depressive disorder, recurrent, moderate: Secondary | ICD-10-CM

## 2018-09-13 NOTE — Progress Notes (Signed)
      [facility] Counselor/Therapist Progress Note  Patient ID: Cody Hines., MRN: [id number],    Date: 09/13/2018  Time Spent: 60 minutes  Treatment Type: Individual Therapy  Reported Symptoms: Depressed mood, Anxious Mood and Ruminations  Mental Status Exam:  Appearance:   Casual     Behavior:  Appropriate and Sharing  Motor:  Normal  Speech/Language:   Normal Rate  Affect:  anxious, tired, depressed, frustrated  Mood:  anxious, depressed and irritable  Thought process:  normal  Thought content:    some runimation but not excessive  Sensory/Perceptual disturbances:    WNL  Orientation:  oriented to person, place, time/date, situation, day of week, month of year and year  Attention:  Good  Concentration:  Fair  Memory:  Immediate;   Fair Recent;   [place] of knowledge:   Good  Insight:    Fair  Judgment:   Good  Impulse Control:  Good   Risk Assessment: Danger to Self:  No Self-injurious Behavior: No Danger to Others: No Duty to Warn:no Physical Aggression / Violence:No  Access to Firearms a concern: No  Gang Involvement:No   Subjective: Patient in today with anxiety and depression but at a lesser level than previously experienced.  Noticeable that he is some more negative and ruminating some on past negativity.  Seems that upcoming holiday time could be related to this.  Encouraged him to see his progress and be willing to use strategies used before to help him choose positive over negative, and also to understand that it's difficult in changing long time habits, as it requires time to establish new/more positive ways of approaching and dealing with life circumstances. He admits it does make a difference and others in his life say to him that he "is not the person he used to be", meaning they see more of a difference in his outlook. Christmas plans currently involve being with both adult children and his grandchildren, an event that his separated wife will also  likely attend, and we discussed ways that patient might can help it to go as well a possible.    Interventions: Solution-Oriented/Positive Psychology and Ego-Supportive  Diagnosis:   ICD-10-CM   1. Major depressive disorder, recurrent episode, moderate ([place]) F33.1     Plan:   Patient to follow up with strategies mentioned above and will see again in 1-2 weeks.   Shanon Ace, LCSW

## 2018-09-16 NOTE — Progress Notes (Signed)
MEDICARE ANNUAL WELLNESS VISIT AND FOLLOW UP Assessment:   Encounter for Medicare annual wellness exam  Essential hypertension - continue medications, DASH diet, exercise and monitor at home. Call if greater than 130/80.  -     CBC with Differential/Platelet -     CMP/GFR -     TSH  Hyperlipidemia -continue medications, check lipids, decrease fatty foods, increase activity.  -     Lipid panel  Prediabetes Discussed general issues about diabetes pathophysiology and management., Educational material distributed., Suggested low cholesterol diet., Encouraged aerobic exercise., Discussed foot care., Reminded to get yearly retinal exam. -     Hemoglobin A1c  Depression, major, in active ([place]) Continue follow up with psych/counseling, doing much better Continue medications  Lifestyle discussed: diet/exerise, sleep hygiene, stress management, hydration  OSA on CPAP Weight loss advised, continue CPAP  Gastroesophageal reflux disease, esophagitis presence not specified Continue PPI/H2 blocker, diet discussed  IBS (irritable bowel syndrome) Diet controlled  Fibromyalgia Increase exercise, healthy eating  BPH (benign prostatic hyperplasia) Continue to monitor  Testosterone deficiency Weight loss advised  Vitamin D deficiency Continue supplement  Medication management -     Magnesium  Acute URI Progressive, duration 3 weeks, with fever - will proceed with abx, declines CXR today but encouraged to follow up for this if not resolving Suggested symptomatic OTC remedies. Nasal saline spray for congestion. Nasal steroids, allergy pill, oral steroids offered Follow up as needed. -     azithromycin (ZITHROMAX) 250 MG tablet; Take 2 tablets (500 mg) on  Day 1,  followed by 1 tablet (250 mg) once daily on Days 2 through 5. -     predniSONE (DELTASONE) 20 MG tablet; 2 tablets daily for 3 days, 1 tablet daily for 4 days. -     promethazine-dextromethorphan (PROMETHAZINE-DM) 6.25-15  MG/5ML syrup; Take 5 mLs by mouth 4 (four) times daily as needed for cough.  Need for vaccination against Streptococcus pneumoniae using pneumococcal conjugate vaccine 13 -     Pneumococcal conjugate vaccine 13-valent IM  Over 30 minutes of exam, counseling, chart review, and critical decision making was performed  Future Appointments  Date Time Provider [place]  09/27/2018  8:00 AM Shanon Ace, LCSW CP-CP None  10/15/2018  8:00 AM Shanon Ace, LCSW CP-CP None  10/22/2018  8:00 AM Shanon Ace, LCSW CP-CP None  10/26/2018  8:00 AM Shanon Ace, LCSW CP-CP None  11/01/2018  9:00 AM Shanon Ace, LCSW CP-CP None  11/02/2018  8:15 AM Dennie Bible, NP GNA[facility]NA None  12/17/2018  9:00 AM [name], MD GAAM-GAAIM None  12/21/2018  9:30 AM Cottle, Billey Co., MD CP-CP None     Plan:   During the course of the visit the patient was educated and counseled about appropriate screening and preventive services including:    Pneumococcal vaccine   Influenza vaccine  Prevnar 13  Td vaccine  Screening electrocardiogram  Colorectal cancer screening  Diabetes screening  Glaucoma screening  Nutrition counseling    Subjective:  Cody P Charls [place]. is a 72 y.o. male who presents for Medicare Annual Wellness Visit and 3 month follow up for HTN, hyperlipidemia, prediabetes, and vitamin D Def.   BP 140/76   Pulse 64   Temp 99.3 F (37.4 C)   Ht 6\' 1"  (1.854 m)   Wt 214 lb (97.1 kg)   SpO2 97%   BMI 28.23 kg/m   Today he reports gradually progressive URI symptoms; reports mild sore throat, congestion, nasal discharge, cough,  productive of thick yellow phlegm, mild fevers today in office today.   He is seeing [facility]. Charlott Holler at [facility] and a therapist this as well, he is on zoloft, and his depression is somewhat better. Wife and him are separated, x 1 year. Daughter is psychologist. He has lorazepam 0.5-1 mg PRN which he typically takes at night.    On CPAP  for OSA , reports 100% compliance and that this is helping.   Had left knee injections by ortho and it has been helping.   He has skin cancer per biopsy to his nose and has appointment with skin surgical center.   BMI is Body mass index is 28.23 kg/m., he has not been working on diet and exercise. Wt Readings from Last 3 Encounters:  09/17/18 214 lb (97.1 kg)  06/18/18 221 lb 3.2 oz (100.3 kg)  03/12/18 228 lb (103.4 kg)   His blood pressure has been controlled at home, today their BP is BP: 140/76  He does not workout. He denies chest pain, shortness of breath, dizziness.   He is on cholesterol medication and denies myalgias. His cholesterol is at goal. The cholesterol last visit was:   Lab Results  Component Value Date   CHOL 145 06/18/2018   HDL 41 06/18/2018   LDLCALC 82 06/18/2018   TRIG 121 06/18/2018   CHOLHDL 3.5 06/18/2018    He has been working on diet and exercise for prediabetes, and denies paresthesia of the feet, polydipsia, polyuria and visual disturbances. Last A1C in the office was:  Lab Results  Component Value Date   HGBA1C 6.3 (H) 06/18/2018   Last GFR Lab Results  Component Value Date   GFRNONAA 51 (L) 06/18/2018   Patient is on Vitamin D supplement.   Lab Results  Component Value Date   VD25OH 47 06/18/2018      Medication Review: Current Outpatient Medications on File Prior to Visit  Medication Sig Dispense Refill  . aspirin EC 81 MG tablet Take 81 mg by mouth daily.    . enalapril (VASOTEC) 20 MG tablet TAKE 1 TABLET BY MOUTH  EVERY DAY FOR BLOOD  PRESSURE 120 tablet 0  . Flaxseed, Linseed, (FLAX SEED OIL) 1000 MG CAPS Take 2 capsules by mouth daily.     Marland Kitchen LORazepam (ATIVAN) 1 MG tablet Take 1 tablet (1 mg total) by mouth at bedtime. (Patient taking differently: Take 1 mg by mouth as needed. ) 30 tablet 5  . Magnesium 500 MG TABS Take by mouth daily.    Marland Kitchen OVER THE COUNTER MEDICATION 5,000 Units. Vitamin D    . rosuvastatin (CRESTOR) 40 MG  tablet TAKE 1 TABLET BY MOUTH  DAILY FOR CHOLESTEROL 90 tablet 1  . sertraline (ZOLOFT) 100 MG tablet Take 2 tablets (200 mg total) by mouth daily. 180 tablet 1   No current facility-administered medications on file prior to visit.     Allergies: Allergies  Allergen Reactions  . Asa [Aspirin]   . Atorvastatin     Muscle aches  . Celebrex [Celecoxib]   . Codeine   . Penicillins   . Savella [Milnacipran Hcl]   . Viagra [Sildenafil Citrate] Other (See Comments)    Headache    Current Problems (verified) has Hypertension; Hyperlipidemia; GERD (gastroesophageal reflux disease); Prediabetes; IBS (irritable bowel syndrome); OSA on CPAP; Fibromyalgia; BPH (benign prostatic hyperplasia); Testosterone deficiency; Medication management; Vitamin D deficiency; Encounter for Medicare annual wellness exam; Obesity (BMI 30.0-34.9); Benign essential tremor; Arthropathy of left knee; Chronic lumbar  pain; and Major depressive disorder, recurrent episode, moderate (HCC) on their problem list.  Screening Tests Immunization History  Administered Date(s) Administered  . Pneumococcal-Unspecified 07/16/2011  . Tdap 01/13/2010  . Zoster 08/09/2013   Preventative care: Last colonoscopy: 07/2013 due 2024 CXR 2009 CT AB 2009 Sleep study 06/2017  Prior vaccinations: TD or Tdap: 2011  Influenza: declines Pneumococcal: 2012 Prevnar13: DUE  Shingles/Zostavax: 2014  Names of Other Physician/Practitioners you currently use: 1. [facility] here for primary care 2. Dr Katy Fitch, eye doctor, last visit  2019 3. Dr Radford Pax, [place] , dentist, last visit 2019  Patient Care Team: [name], MD as PCP - General (Internal Medicine) Druscilla Brownie, MD as Consulting Physician (Dermatology) Teena Irani, MD (Inactive) as Consulting Physician (Gastroenterology) Newman Pies, MD as Consulting Physician (Neurosurgery) Clent Jacks, MD as Consulting Physician  (Ophthalmology)  Surgical: He  has a past surgical history that includes Spine surgery (2007); Knee arthroscopy (Left, 1999);  nasal smr np3 (1985); and Vasectomy (1983). Family His family history includes Arthritis in his sister; Cancer in his father and mother; Diabetes in his father; Heart disease in his sister. Social history  He reports that he quit smoking about 37 years ago. His smoking use included cigarettes. He quit after 30.00 years of use. He has never used smokeless tobacco. He reports that he does not drink alcohol or use drugs.  MEDICARE WELLNESS OBJECTIVES: Physical activity: Current Exercise Habits: The patient does not participate in regular exercise at present, Exercise limited by: psychological condition(s) Cardiac risk factors: Cardiac Risk Factors include: advanced age (>59men, >1 women);dyslipidemia;male gender;hypertension;sedentary lifestyle;smoking/ tobacco exposure Depression/mood screen:   Depression screen [facility] 2/9 09/17/2018  Decreased Interest 1  Down, Depressed, Hopeless 1  PHQ - 2 Score 2  Altered sleeping 0  Tired, decreased energy 1  Change in appetite 0  Feeling bad or failure about yourself  3  Trouble concentrating 0  Moving slowly or fidgety/restless 0  Suicidal thoughts 0  PHQ-9 Score 6  Difficult doing work/chores Somewhat difficult    ADLs:  In your present state of health, do you have any difficulty performing the following activities: 09/17/2018 06/20/2018  Hearing? N N  Vision? N N  Difficulty concentrating or making decisions? N N  Walking or climbing stairs? N N  Dressing or bathing? N N  Doing errands, shopping? N -  Some recent data might be hidden     Cognitive Testing  Alert? Yes  Normal Appearance?Yes  Oriented to person? Yes  Place? Yes   Time? Yes  Recall of three objects?  Yes  Can perform simple calculations? Yes  Displays appropriate judgment?Yes  Can read the correct time from a watch face?Yes  EOL planning: Does  Patient Have a Medical Advance Directive?: No Would patient like information on creating a medical advance directive?: Yes (MAU/Ambulatory/Procedural Areas - Information given)   Objective:   Today's Vitals   09/17/18 1035  BP: 140/76  Pulse: 64  Temp: 99.3 F (37.4 C)  SpO2: 97%  Weight: 214 lb (97.1 kg)  Height: 6\' 1"  (1.854 m)  PainSc: 6   PainLoc: Back   Body mass index is 28.23 kg/m.  General appearance: alert, no distress, WD/WN, male HEENT: normocephalic, sclerae anicteric, TMs pearly, nares patent, no discharge or erythema, pharynx normal Oral cavity: MMM, no lesions Neck: supple, no lymphadenopathy, no thyromegaly, no masses Heart: RRR, normal S1, S2, no murmurs Lungs: CTA bilaterally, no wheezes, rhonchi, or rales Abdomen: +bs, soft,  non tender, non distended, no masses, no hepatomegaly, no splenomegaly Musculoskeletal: nontender, no swelling, no obvious deformity Extremities: no edema, no cyanosis, no clubbing Pulses: 2+ symmetric, upper and lower extremities, normal cap refill Neurological: alert, oriented x 3, CN2-12 intact, strength normal upper extremities and lower extremities, sensation normal throughout, DTRs 2+ throughout, no cerebellar signs, gait normal. Has chronic resting tremor of LUE.  Psychiatric: depressed affect, behavior normal, pleasant   Medicare Attestation I have personally reviewed: The patient's medical and social history Their use of alcohol, tobacco or illicit drugs Their current medications and supplements The patient's functional ability including ADLs,fall risks, home safety risks, cognitive, and hearing and visual impairment Diet and physical activities Evidence for depression or mood disorders  The patient's weight, height, BMI, and visual acuity have been recorded in the chart.  I have made referrals, counseling, and provided education to the patient based on review of the above and I have provided the patient with a written  personalized care plan for preventive services.     Izora Ribas, NP   09/17/2018

## 2018-09-17 ENCOUNTER — Ambulatory Visit (INDEPENDENT_AMBULATORY_CARE_PROVIDER_SITE_OTHER): Payer: Medicare Other | Admitting: Adult Health

## 2018-09-17 ENCOUNTER — Encounter: Payer: Self-pay | Admitting: Adult Health

## 2018-09-17 VITALS — BP 140/76 | HR 64 | Temp 99.3°F | Ht 73.0 in | Wt 214.0 lb

## 2018-09-17 DIAGNOSIS — F331 Major depressive disorder, recurrent, moderate: Secondary | ICD-10-CM | POA: Diagnosis not present

## 2018-09-17 DIAGNOSIS — G4733 Obstructive sleep apnea (adult) (pediatric): Secondary | ICD-10-CM | POA: Diagnosis not present

## 2018-09-17 DIAGNOSIS — Z9989 Dependence on other enabling machines and devices: Secondary | ICD-10-CM

## 2018-09-17 DIAGNOSIS — M797 Fibromyalgia: Secondary | ICD-10-CM

## 2018-09-17 DIAGNOSIS — K219 Gastro-esophageal reflux disease without esophagitis: Secondary | ICD-10-CM | POA: Diagnosis not present

## 2018-09-17 DIAGNOSIS — R350 Frequency of micturition: Secondary | ICD-10-CM

## 2018-09-17 DIAGNOSIS — E782 Mixed hyperlipidemia: Secondary | ICD-10-CM

## 2018-09-17 DIAGNOSIS — R7303 Prediabetes: Secondary | ICD-10-CM

## 2018-09-17 DIAGNOSIS — K589 Irritable bowel syndrome without diarrhea: Secondary | ICD-10-CM | POA: Diagnosis not present

## 2018-09-17 DIAGNOSIS — E349 Endocrine disorder, unspecified: Secondary | ICD-10-CM | POA: Diagnosis not present

## 2018-09-17 DIAGNOSIS — Z0001 Encounter for general adult medical examination with abnormal findings: Secondary | ICD-10-CM | POA: Diagnosis not present

## 2018-09-17 DIAGNOSIS — R6889 Other general symptoms and signs: Secondary | ICD-10-CM | POA: Diagnosis not present

## 2018-09-17 DIAGNOSIS — G8929 Other chronic pain: Secondary | ICD-10-CM

## 2018-09-17 DIAGNOSIS — G250 Essential tremor: Secondary | ICD-10-CM | POA: Diagnosis not present

## 2018-09-17 DIAGNOSIS — N401 Enlarged prostate with lower urinary tract symptoms: Secondary | ICD-10-CM | POA: Diagnosis not present

## 2018-09-17 DIAGNOSIS — M1712 Unilateral primary osteoarthritis, left knee: Secondary | ICD-10-CM

## 2018-09-17 DIAGNOSIS — M545 Low back pain, unspecified: Secondary | ICD-10-CM

## 2018-09-17 DIAGNOSIS — Z79899 Other long term (current) drug therapy: Secondary | ICD-10-CM

## 2018-09-17 DIAGNOSIS — E669 Obesity, unspecified: Secondary | ICD-10-CM | POA: Diagnosis not present

## 2018-09-17 DIAGNOSIS — F325 Major depressive disorder, single episode, in full remission: Secondary | ICD-10-CM

## 2018-09-17 DIAGNOSIS — Z23 Encounter for immunization: Secondary | ICD-10-CM | POA: Diagnosis not present

## 2018-09-17 DIAGNOSIS — E559 Vitamin D deficiency, unspecified: Secondary | ICD-10-CM

## 2018-09-17 DIAGNOSIS — Z0000 Encounter for general adult medical examination without abnormal findings: Secondary | ICD-10-CM

## 2018-09-17 DIAGNOSIS — I10 Essential (primary) hypertension: Secondary | ICD-10-CM | POA: Diagnosis not present

## 2018-09-17 DIAGNOSIS — E66811 Obesity, class 1: Secondary | ICD-10-CM

## 2018-09-17 MED ORDER — PREDNISONE 20 MG PO TABS: ORAL_TABLET | ORAL | 0 refills | Status: DC

## 2018-09-17 MED ORDER — AZITHROMYCIN 250 MG PO TABS: ORAL_TABLET | ORAL | 1 refills | Status: AC

## 2018-09-17 MED ORDER — PROMETHAZINE-DM 6.25-15 MG/5ML PO SYRP: 5.0000 mL | ORAL_SOLUTION | Freq: Four times a day (QID) | ORAL | 1 refills | Status: DC | PRN

## 2018-09-17 NOTE — Patient Instructions (Addendum)
Cody Hines , Thank you for taking time to come for your Medicare Wellness Visit. I appreciate your ongoing commitment to your health goals. Please review the following plan we discussed and let me know if I can assist you in the future.   These are the goals we discussed: Goals    . Blood Pressure < 140/90    . Exercise 3x per week (20 min per time)    . HEMOGLOBIN A1C < 5.7       This is a list of the screening recommended for you and due dates:  Health Maintenance  Topic Date Due  . Flu Shot  01/04/2019*  . Pneumonia vaccines (2 of 2 - PPSV23) 09/18/2019  . Tetanus Vaccine  01/14/2020  . Colon Cancer Screening  04/23/2023  .  Hepatitis C: One time screening is recommended by Center for Disease Control  (CDC) for  adults born from [birth year] through 1963.   Completed  *Topic was postponed. The date shown is not the original due date.     Aim for 7+ servings of fruits and vegetables daily  65-80+ fluid ounces of water or unsweet tea for healthy kidneys  Limit to max 1 drink of alcohol per day; avoid smoking/tobacco  Limit animal fats in diet for cholesterol and heart health - choose grass fed whenever available  Avoid highly processed foods, and foods high in saturated/trans fats  Aim for low stress - take time to unwind and care for your mental health  Aim for 150 min of moderate intensity exercise weekly for heart health, and weights twice weekly for bone health  Aim for 7-9 hours of sleep daily      Exercising to Stay Healthy Exercising regularly is important. It has many health benefits, such as:  Improving your overall fitness, flexibility, and endurance.  Increasing your bone density.  Helping with weight control.  Decreasing your body fat.  Increasing your muscle strength.  Reducing stress and tension.  Improving your overall health.  In order to become healthy and stay healthy, it is recommended that you do moderate-intensity and vigorous-intensity  exercise. You can tell that you are exercising at a moderate intensity if you have a higher heart rate and faster breathing, but you are still able to hold a conversation. You can tell that you are exercising at a vigorous intensity if you are breathing much harder and faster and cannot hold a conversation while exercising. How often should I exercise? Choose an activity that you enjoy and set realistic goals. Your health care provider can help you to make an activity plan that works for you. Exercise regularly as directed by your health care provider. This may include:  Doing resistance training twice each week, such as: ? Push-ups. ? Sit-ups. ? Lifting weights. ? Using resistance bands.  Doing a given intensity of exercise for a given amount of time. Choose from these options: ? 150 minutes of moderate-intensity exercise every week. ? 75 minutes of vigorous-intensity exercise every week. ? A mix of moderate-intensity and vigorous-intensity exercise every week.  Children, pregnant women, people who are out of shape, people who are overweight, and older adults may need to consult a health care provider for individual recommendations. If you have any sort of medical condition, be sure to consult your health care provider before starting a new exercise program. What are some exercise ideas? Some moderate-intensity exercise ideas include:  Walking at a rate of 1 mile in 15 minutes.  Biking.  Hiking.  Golfing.  Dancing.  Some vigorous-intensity exercise ideas include:  Walking at a rate of at least 4.5 miles per hour.  Jogging or running at a rate of 5 miles per hour.  Biking at a rate of at least 10 miles per hour.  Lap swimming.  Roller-skating or in-line skating.  Cross-country skiing.  Vigorous competitive sports, such as football, basketball, and soccer.  Jumping rope.  Aerobic dancing.  What are some everyday activities that can help me to get exercise?  [place]  work, such as: ? Pushing a Conservation officer, nature. ? Raking and bagging leaves.  Washing and waxing your car.  Pushing a stroller.  Shoveling snow.  Gardening.  Washing windows or floors. How can I be more active in my day-to-day activities?  Use the stairs instead of the elevator.  Take a walk during your lunch break.  If you drive, park your car farther away from work or school.  If you take public transportation, get off one stop early and walk the rest of the way.  Make all of your phone calls while standing up and walking around.  Get up, stretch, and walk around every 30 minutes throughout the day. What guidelines should I follow while exercising?  Do not exercise so much that you hurt yourself, feel dizzy, or get very short of breath.  Consult your health care provider before starting a new exercise program.  Wear comfortable clothes and shoes with good support.  Drink plenty of water while you exercise to prevent dehydration or heat stroke. Body water is lost during exercise and must be replaced.  Work out until you breathe faster and your heart beats faster. This information is not intended to replace advice given to you by your health care provider. Make sure you discuss any questions you have with your health care provider. Document Released: 10/25/2010 Document Revised: 02/28/2016 Document Reviewed: 02/23/2014 Elsevier Interactive Patient Education  [employer].

## 2018-09-18 LAB — LIPID PANEL
Cholesterol: 120 mg/dL (ref <200)
HDL: 52 mg/dL (ref >40)
LDL Cholesterol (Calc): 51 mg/dL (calc)
Non-HDL Cholesterol (Calc): 68 mg/dL (calc) (ref <130)
Total CHOL/HDL Ratio: 2.3 (calc) (ref <5.0)
Triglycerides: 87 mg/dL (ref <150)

## 2018-09-18 LAB — CBC WITH DIFFERENTIAL/PLATELET
Basophils Absolute: 21 cells/uL (ref 0–200)
Basophils Relative: 0.4 %
EOS ABS: 239 {cells}/uL (ref 15–500)
Eosinophils Relative: 4.6 %
HEMATOCRIT: 44.8 % (ref 38.5–50.0)
Hemoglobin: 14.9 g/dL (ref 13.2–17.1)
LYMPHS ABS: 1550 {cells}/uL (ref 850–3900)
MCH: 29.7 pg (ref 27.0–33.0)
MCHC: 33.3 g/dL (ref 32.0–36.0)
MCV: 89.2 fL (ref 80.0–100.0)
MPV: 11.4 fL (ref 7.5–12.5)
Monocytes Relative: 8.8 %
NEUTROS PCT: 56.4 %
Neutro Abs: 2933 cells/uL (ref 1500–7800)
Platelets: 173 10*3/uL (ref 140–400)
RBC: 5.02 10*6/uL (ref 4.20–5.80)
RDW: 13.8 % (ref 11.0–15.0)
Total Lymphocyte: 29.8 %
WBC: 5.2 10*3/uL (ref 3.8–10.8)
WBCMIX: 458 {cells}/uL (ref 200–950)

## 2018-09-18 LAB — COMPLETE METABOLIC PANEL WITH GFR
AG Ratio: 1.7 (calc) (ref 1.0–2.5)
ALT: 26 U/L (ref 9–46)
AST: 44 U/L — ABNORMAL HIGH (ref 10–35)
Albumin: 4.5 g/dL (ref 3.6–5.1)
Alkaline phosphatase (APISO): 86 U/L (ref 40–115)
BILIRUBIN TOTAL: 1 mg/dL (ref 0.2–1.2)
BUN/Creatinine Ratio: 8 (calc) (ref 6–22)
BUN: 11 mg/dL (ref 7–25)
CO2: 32 mmol/L (ref 20–32)
Calcium: 10 mg/dL (ref 8.6–10.3)
Chloride: 104 mmol/L (ref 98–110)
Creat: 1.31 mg/dL — ABNORMAL HIGH (ref 0.70–1.18)
GFR, Est African American: 63 mL/min/{1.73_m2} (ref >=60)
GFR, Est Non African American: 55 mL/min/{1.73_m2} — ABNORMAL LOW (ref >=60)
Globulin: 2.6 g/dL (calc) (ref 1.9–3.7)
Glucose, Bld: 114 mg/dL — ABNORMAL HIGH (ref 65–99)
Potassium: 4.7 mmol/L (ref 3.5–5.3)
Sodium: 143 mmol/L (ref 135–146)
TOTAL PROTEIN: 7.1 g/dL (ref 6.1–8.1)

## 2018-09-18 LAB — HEMOGLOBIN A1C
HEMOGLOBIN A1C: 6.1 %{Hb} — AB (ref <5.7)
Mean Plasma Glucose: 128 (calc)
eAG (mmol/L): 7.1 (calc)

## 2018-09-18 LAB — MAGNESIUM: Magnesium: 2.1 mg/dL (ref 1.5–2.5)

## 2018-09-18 LAB — VITAMIN D 25 HYDROXY (VIT D DEFICIENCY, FRACTURES): Vit D, 25-Hydroxy: 49 ng/mL (ref 30–100)

## 2018-09-18 LAB — TSH: TSH: 2.11 mIU/L (ref 0.40–4.50)

## 2018-09-19 ENCOUNTER — Other Ambulatory Visit: Payer: Self-pay | Admitting: Adult Health

## 2018-09-20 ENCOUNTER — Ambulatory Visit: Payer: 59 | Admitting: Psychiatry

## 2018-09-24 ENCOUNTER — Ambulatory Visit (INDEPENDENT_AMBULATORY_CARE_PROVIDER_SITE_OTHER): Payer: Medicare Other | Admitting: Psychiatry

## 2018-09-24 DIAGNOSIS — F331 Major depressive disorder, recurrent, moderate: Secondary | ICD-10-CM | POA: Diagnosis not present

## 2018-09-24 NOTE — Progress Notes (Signed)
      [facility] Counselor/Therapist Progress Note  Patient ID: Cody Hines., MRN: [id number],    Date: 09/24/2018  Time Spent: 60 minutes  Treatment Type: Individual Therapy   Reported Symptoms:  Frustration, anxiety, some depression  Mental Status Exam:  Appearance:   Casual     Behavior:  Appropriate and Sharing  Motor:  Normal  Speech/Language:   Normal Rate  Affect:  Congruent  Mood:  anxious and depressed  Thought process:  normal  Thought content:    WNL  Sensory/Perceptual disturbances:    WNL  Orientation:  oriented to person, place, time/date, situation, day of week, month of year and year  Attention:  Good  Concentration:  Good  Memory:  WNL  Fund of knowledge:   Good  Insight:    Fair  Judgment:   Good  Impulse Control:  Good   Risk Assessment: Danger to Self:  No Self-injurious Behavior: No Danger to Others: No Duty to Warn:no Physical Aggression / Violence:No  Access to Firearms a concern: No  Gang Involvement:No    Subjective: Patient in today reporting frustration, and some depression and anxiety.  Shared about recent family situations where patient and separated wife were both invited and present.  Patient tends to see the negative only when it relates to separated wife. Is able to at least for a short time period, some positive when he is re-directed in that direction.  Progress in positive vs negative thoughts is up and down.  Reviewed strategies that promote positive mindset and encouraged him to use them.  Also reviewed CBT visual re: thoughts, feelings, behavior.  Gave him a copy of the CBT model and encouraged him to utilize it between now and next appt within a couple weeks.   Interventions: Cognitive Behavioral Therapy and Ego-Supportive  Diagnosis:   ICD-10-CM   1. Major depressive disorder, recurrent episode, moderate ([place]) F33.1     Plan: Patient to follow through on recommended homework above and will see within 2 wks for  goal-directed treatment.    Shanon Ace, LCSW

## 2018-09-27 ENCOUNTER — Ambulatory Visit: Payer: 59 | Admitting: Psychiatry

## 2018-10-04 DIAGNOSIS — C44321 Squamous cell carcinoma of skin of nose: Secondary | ICD-10-CM | POA: Diagnosis not present

## 2018-10-06 HISTORY — PX: SKIN CANCER EXCISION: SHX779

## 2018-10-15 ENCOUNTER — Ambulatory Visit (INDEPENDENT_AMBULATORY_CARE_PROVIDER_SITE_OTHER): Payer: Medicare Other | Admitting: Psychiatry

## 2018-10-15 DIAGNOSIS — F331 Major depressive disorder, recurrent, moderate: Secondary | ICD-10-CM | POA: Diagnosis not present

## 2018-10-15 NOTE — Progress Notes (Signed)
      [facility] Counselor/Therapist Progress Note  Patient ID: Cody Hines., MRN: [id number],    Date: 10/15/2018  Time Spent: 60 minutes  Treatment Type: Individual Therapy  Reported Symptoms:   Anxiety, aggravation, depression, apprehension  Mental Status Exam:  Appearance:   Casual     Behavior:  Appropriate and Sharing  Motor:  Normal  Speech/Language:   Normal Rate  Affect:  Depressed and anxious  Mood:  anxious and depressed  Thought process:  normal  Thought content:    WNL  Sensory/Perceptual disturbances:    WNL  Orientation:  oriented to person, place, time/date, situation, day of week, month of year and year  Attention:  Good  Concentration:  Good  Memory:  Recent- [employer] of knowledge:   Good  Insight:    Fair  Judgment:   Fair  Impulse Control:  Fair   Risk Assessment: Danger to Self:  No Self-injurious Behavior: No Danger to Others: No Duty to Warn:no Physical Aggression / Violence:No  Access to Firearms a concern: No  Gang Involvement:No   Subjective:   Patient in today and reports symptoms of anxiety, aggravation, depression, and apprehension.  Some continued irritability towards separated wife.  Tendency to make assumptions that are self-negating, which we worked on more today.  Shared about some contact with former friend he knew in high school and will be seeing her this weekend at a funeral.  Depressive symptoms have improved some.  His affect supports that and it's noticeable that he smiles and laughs some more than in past times.  Has a couple of friends that are very supportive of patient.  Trying to stay in good contact with adult children and has plans to see them tonight.  Before session end, we reviewed the CBT diagram and strategies to reinforce a more positive thought pattern.    Interventions: Cognitive Behavioral Therapy and Ego-Supportive  Diagnosis:   ICD-10-CM   1. Major depressive disorder, recurrent episode, moderate ([place])  F33.1     Plan: Patient to be more aware of his thoughts and often jumping to conclusions, frequently negative conclusions.  To work on proactively converting his thoughts to be more positive versus negative, while better recognizing and understanding it when he immediately goes in a negative direction and how this impacts his outlook and how he ends up feeling.  To return next week.  Shanon Ace, LCSW

## 2018-10-22 ENCOUNTER — Ambulatory Visit (INDEPENDENT_AMBULATORY_CARE_PROVIDER_SITE_OTHER): Payer: Medicare Other | Admitting: Psychiatry

## 2018-10-22 DIAGNOSIS — F331 Major depressive disorder, recurrent, moderate: Secondary | ICD-10-CM

## 2018-10-22 NOTE — Progress Notes (Signed)
      [facility] Counselor/Therapist Progress Note  Patient ID: Cody Hines., MRN: [id number],    Date: 10/22/2018  Time Spent:  60 minutes  Treatment Type: Individual Therapy  Reported Symptoms:   Some anxiety and depression (improved)  Mental Status Exam:  Appearance:   Casual     Behavior:  Appropriate and Sharing  Motor:  Normal  Speech/Language:   Normal Rate  Affect:  Congruent  Mood:  anxious and depressed  Thought process:  normal  Thought content:    WNL  Sensory/Perceptual disturbances:    WNL  Orientation:  oriented to person, place, time/date, situation, day of week, month of year and year  Attention:  Good  Concentration:  Good  Memory:  WNL  Fund of knowledge:   Good  Insight:    Fair  Judgment:   Good  Impulse Control:  Good   Risk Assessment: Danger to Self:  No Self-injurious Behavior: No Danger to Others: No Duty to Warn:no Physical Aggression / Violence:No  Access to Firearms a concern: No  Gang Involvement:No   Subjective: Patient in today with some anxiety and depression, although noticeably less.  Had good visit with 2 of his grandchildren recently.  Mood can be a bit fragile at times but today is better.  Several good laughs occasionally as he talked.  Still some sharp feelings in regards to separated wife, however was able to process some of that today without getting stuck in negativity like has often happened in past.  He reports that he notices that "I  don't  let little things bother me as much as I used to."  Also reports that he doesn't personalize things quite as much---still working on that.  Patient was given a printout of CBT diagram that we discussed and encouraged him to review it more and practice CBT strategies we spoke about today.  Interventions:  Cognitive Behavioral / Solution-focused  Diagnosis:   ICD-10-CM   1. Major depressive disorder, recurrent episode, moderate ([place]) F33.1     Plan:  Patient will follow up on  CBT info provided to him and discussed today.  To return in 1-2 weeks to continue goal-directed treatment.   Shanon Ace, LCSW

## 2018-10-26 ENCOUNTER — Ambulatory Visit: Payer: 59 | Admitting: Psychiatry

## 2018-11-01 ENCOUNTER — Encounter: Payer: Self-pay | Admitting: Adult Health

## 2018-11-01 ENCOUNTER — Ambulatory Visit (INDEPENDENT_AMBULATORY_CARE_PROVIDER_SITE_OTHER): Payer: Medicare Other | Admitting: Psychiatry

## 2018-11-01 DIAGNOSIS — F331 Major depressive disorder, recurrent, moderate: Secondary | ICD-10-CM

## 2018-11-01 NOTE — Progress Notes (Signed)
      [facility] Counselor/Therapist Progress Note  Patient ID: Cody Hines., MRN: [id number],    Date: 11/01/2018  Time Spent:  58 minutes  Treatment Type: Individual Therapy  Reported Symptoms:   Some anxiety and depression   Mental Status Exam:  Appearance:   Casual     Behavior:  Appropriate and Sharing  Motor:  Normal  Speech/Language:   Normal Rate  Affect:  Congruent  Mood:  anxious and depressed  Thought process:  normal  Thought content:    WNL  Sensory/Perceptual disturbances:    WNL  Orientation:  oriented to person, place, time/date, situation, day of week, month of year and year  Attention:  Good  Concentration:  Good  Memory:  WNL  Fund of knowledge:   Good  Insight:    Fair  Judgment:   Good  Impulse Control:  Good   Risk Assessment: Danger to Self:  No Self-injurious Behavior: No Danger to Others: No Duty to Warn:no Physical Aggression / Violence:No  Access to Firearms a concern: No  Gang Involvement:No   Subjective: Patient in today with some anxiety and depression, a little more than last appt.  Explains that his mood isn't as good when the weather is more dark, cold, or rainy.  Talked about activities he might choose that would help especially on those days.  Still some sharp feelings in regards to separated wife, however was able to process some of that today without getting stuck in negativity.  Reports that he knows he is better over-all as he doesn't personalize as much in his relationship with others and isn't quite as negative.  Continues to work on both of these. Patient was given a printout of CBT diagram at last session and we followed up on that again today, emphasizing his ability to be proactive and choosing to be more positive and leading to feeling better which likely will lead him to more positives behaviorally.  Did call his daughter and lined up a visit with her and grandson, which went well.  Interventions:  Cognitive Behavioral  / Solution-focused  Diagnosis:   ICD-10-CM   1. Major depressive disorder, recurrent episode, moderate ([place]) F33.1     Plan:  Patient will follow up on CBT info provided earlier to him and discussed some again today.  To return in 1-2 weeks to continue goal-directed treatment.   Cody Ace, LCSW

## 2018-11-01 NOTE — Progress Notes (Signed)
[facility]  PATIENT: Teran P [name]. DOB: Dec 28, 1945   REASON FOR VISIT: Obstructive sleep apnea with  CPAP compliance HISTORY FROM: Patient    HISTORY OF PRESENT ILLNESS:UPDATE 1/28/2020CM Mr. Amano, 73 year old male returns for follow-up with history of obstructive sleep apnea here for CPAP compliance.  He has been doing well with his CPAP he does need supplies.  Compliance data dated 10/03/2018 to 11/01/2018 shows compliance greater than 4 hours 87%.  Average usage 6 hours 6 minutes set pressure 11 cm.  Leak 95th percentile at 29.  AHI 0.8.  Patient states that he has not changed the mask since last seen which was in April.  He probably just needs a new mask.  He is not aware of the leak.  He returns for reevaluation.    UPDATE 4/15/2019CM Mr. Mcentee, 73 year old male returns for follow-up with history of obstructive sleep apnea here for CPAP compliance.  He claims due to all of his allergies he has not been able to use his CPAP every night due to stuffiness.  He is on Flonase for his allergies.  He has nasal prongs.  Compliance data dated 11/15/2017 -4/ 10 2019 shows compliance greater than 4 hours at 63% which is better than when last seen at 50%.  Average usage 5 hours 39 minutes.  Set pressure of 11 cm EPR level 2 AHI 2.3..  He returns for reevaluation    UPDATE 12/14/2018CM Mr. Eisler, 73 year old male returns for follow-up for initial CPAP compliance.  He has a history of obstructive sleep apnea confirmed by sleep study.  Patient currently has a cold and has not been using his CPAP consistently.  He was using a mask but has switched to nasal pillows which he prefers.  Data dated 08/18/2017-09/16/2017 shows greater than 4-hour compliance at 50%.  Average usage 6 hours 21 minutes.  Pressure at 11 cm.  EPR level 2 AHI 1.7.  No leaks. Patient was made aware that he needs to use it at least 70% of the time greater than 4 hours he returns for  reevaluation.  7/25/18CDEverette P Marrell Dicaprio. is a 73 y.o. male , seen here as in a referral/ revisit  from Dr. Melford Aase for a sleep evaluation,  Miss dismisses this 73 year old Caucasian right-handed gentleman presents today upon referral of his primary care physician for evaluation of sleep apnea. Apparently the patient was diagnosed many years ago, at the [facility] heart [facility] sleep center. He was diagnosed with apnea for the first time 20 some years ago, he kept his very old ResMed CPAP but states that it has never ever helped him sleeping better and he could not tolerate various interfaces. He tried nasal cannulas, nasal and full face masks. He is claustrophobic. He would like to know if there is alternatives to CPAP available such as a dental device, but to give such recommendations we need to know what kind of apnea he has and to what degree. I also explained that comorbidities such as low nocturnal oxygen levels cannot be corrected by a dental device but only by CPAP. It may be that CPAP was not the right modality for him, and he may respond better to a BiPAP. His history of UPPP may exclude him from dental device use.    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 He is just now had hyaluronic acid injections.ENT surgery - the patient has undergone a UPPP and septoplasty. This did not cure the apnea. His wife left the marital bedroom because of his ongoing grunting , snoring and apnea. The patient was 46 at the time.   Sleep habits are as follows: Mr. Nix usual bedtime is 11 PM, he does not have trouble falling asleep, has nocturia times 2, and usually sleep through. Rising time now as a retiree in AM 7:30. He describes his bedroom is cool, quiet and dark and she sleeps alone, he  prefers to sleep on his side, with many pillows. He doesn't feel that he dreams nightly and that his dreams are very vivid. When he wakes up in the morning he feels not refreshed or restored, always has a dry mouth, parched, rarely does he wake up with headaches, neither dizziness, palpitations, chest pain. He does wake up diaphoretic.   REVIEW OF SYSTEMS: Full 14 system review of systems performed and notable only for those listed, all others are neg:  Constitutional: neg  Cardiovascular: neg Ear/Nose/Throat: neg  Skin: neg Eyes: Itching Respiratory: neg Gastroitestinal: neg  Hematology/Lymphatic: Easy bruising Endocrine: neg Musculoskeletal: History of back pain Allergy/Immunology: Environmental allergies Neurological: History of headaches Psychiatric: Depression Sleep : Obstructive sleep apnea with CPAP   ALLERGIES: Allergies  Allergen Reactions  . Asa [Aspirin] Diarrhea    Nausea, hx ulcers  . Atorvastatin     Muscle aches  . Celebrex [Celecoxib]   . Codeine   . Penicillins   . Savella [Milnacipran Hcl]   . Viagra [Sildenafil Citrate] Other (See Comments)    Headache    HOME MEDICATIONS: Outpatient Medications Prior to Visit  Medication Sig Dispense Refill  . aspirin EC 81 MG tablet Take 81 mg by mouth daily.    . enalapril (VASOTEC) 20 MG tablet TAKE 1 TABLET BY MOUTH  EVERY DAY FOR BLOOD  PRESSURE 120 tablet 0  . Flaxseed, Linseed, (FLAX SEED OIL) 1000 MG CAPS Take 2 capsules by mouth daily.     Marland Kitchen LORazepam (ATIVAN) 1 MG tablet Take 1 tablet (1 mg total) by mouth at bedtime. (Patient taking differently: Take 1 mg by mouth as needed. ) 30 tablet 5  . Magnesium 500 MG TABS Take by mouth daily.    Marland Kitchen OVER THE COUNTER MEDICATION 5,000 Units. Vitamin D    . predniSONE (DELTASONE) 20 MG tablet 2 tablets daily for 3 days, 1 tablet daily for 4 days. 10 tablet 0  . promethazine-dextromethorphan (PROMETHAZINE-DM) 6.25-15 MG/5ML syrup Take 5 mLs by mouth 4 (four) times daily  as needed for cough. 240 mL 1  . rosuvastatin (CRESTOR) 40 MG tablet TAKE 1 TABLET BY MOUTH  DAILY FOR CHOLESTEROL 90 tablet 1  . sertraline (ZOLOFT) 100 MG tablet Take 2 tablets (200 mg total) by mouth daily. 180 tablet 1   No facility-administered medications prior to visit.     PAST MEDICAL HISTORY: Past Medical History:  Diagnosis Date  . BPH (benign prostatic hyperplasia)   . Cancer (HCC)    nose, skin cancer  . Elevated hemoglobin A1c   . Fibromyalgia   . GERD (gastroesophageal reflux disease)   . Hyperlipidemia   . Hypertension   . Hypogonadism male   . IBS (irritable bowel syndrome)   . OSA (obstructive sleep apnea)     PAST SURGICAL HISTORY: Past Surgical History:  Procedure Laterality Date  .  nasal smr np3  1985  .  KNEE ARTHROSCOPY Left 1999  . SKIN CANCER EXCISION  2020   nose  . SPINE SURGERY  2007   L5 S 1 Disk  . VASECTOMY  1983    FAMILY HISTORY: Family History  Problem Relation Age of Onset  . Cancer Mother        breast  . Cancer Father        lung  . Diabetes Father   . Heart disease Sister   . Arthritis Sister     SOCIAL HISTORY: Social History   Socioeconomic History  . Marital status: Legally Separated    Spouse name: Kennyth Lose  . Number of children: Not on file  . Years of education: Not on file  . Highest education level: Not on file  Occupational History  . 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 Financial resource strain: Not on file  . Food insecurity:    Worry: Not on file    Inability: Not on file  . Transportation needs:    Medical: Not on file    Non-medical: Not on file  Tobacco Use  . Smoking status: Former Smoker    Years: 30.00    Types: Cigarettes    Last attempt to quit: 10/06/1980    Years since quitting: 38.0  . Smokeless tobacco: Never Used  Substance and Sexual Activity  . Alcohol use: No    Frequency: Never    Comment: Not drinking x 1 month  . Drug use: No  . Sexual activity: Not Currently  Lifestyle   . Physical activity:    Days per week: Not on file    Minutes per session: Not on file  . Stress: Not on file  Relationships  . Social connections:    Talks on phone: Not on file    Gets together: Not on file    Attends religious service: Not on file    Active member of club or organization: Not on file    Attends meetings of clubs or organizations: Not on file    Relationship status: Not on file  . Intimate partner violence:    Fear of current or ex partner: Not on file    Emotionally abused: Not on file    Physically abused: Not on file    Forced sexual activity: Not on file  Other Topics Concern  . Not on file  Social History Narrative  . Not on file     PHYSICAL EXAM  Vitals:   11/02/18 0815  BP: 119/60  Pulse: 71  Weight: 219 lb 9.6 oz (99.6 kg)  Height: 6\' 1"  (1.854 m)   Body mass index is 28.97 kg/m.  Generalized: Well developed, obese male in no acute distress  Head: normocephalic and atraumatic,. Oropharynx benign mallopatti 5 Neck: Supple circumference 19.5 Lungs cleari Musculoskeletal: No deformity  Skin no rash or edema  Neurological examination   Mentation: Alert oriented to time, place, history taking. Attention span and concentration appropriate. Recent and remote memory intact.  Follows all commands speech and language fluent.   Cranial nerve II-XII: Pupils were equal round reactive to light extraocular movements were full, visual field were full on confrontational test. Facial sensation and strength were normal. hearing was intact to finger rubbing bilaterally. Uvula tongue midline. head turning and shoulder shrug were normal and symmetric.Tongue protrusion into cheek strength was normal. Motor: normal bulk and tone, full strength in the BUE, BLE,  Sensory: normal and symmetric to light touch,  Coordination: finger-nose-finger, heel-to-shin bilaterally, no  dysmetria Gait and Station: Rising up from seated position without assistance, normal  stance,  moderate stride, no difficulty with turns DIAGNOSTIC DATA (LABS, IMAGING, TESTING) - I reviewed patient records, labs, notes, testing and imaging myself where available.  Lab Results  Component Value Date   WBC 5.2 09/17/2018   HGB 14.9 09/17/2018   HCT 44.8 09/17/2018   MCV 89.2 09/17/2018   PLT 173 09/17/2018      Component Value Date/Time   NA 143 09/17/2018 1105   K 4.7 09/17/2018 1105   CL 104 09/17/2018 1105   CO2 32 09/17/2018 1105   GLUCOSE 114 (H) 09/17/2018 1105   BUN 11 09/17/2018 1105   CREATININE 1.31 (H) 09/17/2018 1105   CALCIUM 10.0 09/17/2018 1105   PROT 7.1 09/17/2018 1105   ALBUMIN 4.2 04/24/2017 1153   AST 44 (H) 09/17/2018 1105   ALT 26 09/17/2018 1105   ALKPHOS 49 04/24/2017 1153   BILITOT 1.0 09/17/2018 1105   GFRNONAA 55 (L) 09/17/2018 1105   GFRAA 63 09/17/2018 1105   Lab Results  Component Value Date   CHOL 120 09/17/2018   HDL 52 09/17/2018   LDLCALC 51 09/17/2018   TRIG 87 09/17/2018   CHOLHDL 2.3 09/17/2018   Lab Results  Component Value Date   HGBA1C 6.1 (H) 09/17/2018    Lab Results  Component Value Date   TSH 2.11 09/17/2018      ASSESSMENT AND PLAN  73 y.o. year old male  has a past medical history of BPH (benign prostatic hyperplasia), Elevated hemoglobin A1c, Fibromyalgia, GERD (gastroesophageal reflux disease), Hyperlipidemia, Hypertension, Hypogonadism male, IBS (irritable bowel syndrome), and OSA (obstructive sleep apnea). here to follow-up for obstructive sleep apnea , Compliance data dated 10/03/2018 to 11/01/2018 shows compliance greater than 4 hours 87%.  Average usage 6 hours 6 minutes set pressure 11 cm.  Leak 95th percentile at 29.  AHI 0.8.  Patient states that he has not changed the mask since last seen which was in April.   CPAP compliance 87% Needs new mask  Continue same settings Will order supplies F/U in 6 months Dennie Bible, [facility], [facility], APRN  [facility] [address],  [place], [place] [address] [phone]

## 2018-11-02 ENCOUNTER — Encounter: Payer: Self-pay | Admitting: Nurse Practitioner

## 2018-11-02 ENCOUNTER — Ambulatory Visit (INDEPENDENT_AMBULATORY_CARE_PROVIDER_SITE_OTHER): Payer: Medicare Other | Admitting: Nurse Practitioner

## 2018-11-02 VITALS — BP 119/60 | HR 71 | Ht 73.0 in | Wt 219.6 lb

## 2018-11-02 DIAGNOSIS — G4733 Obstructive sleep apnea (adult) (pediatric): Secondary | ICD-10-CM | POA: Diagnosis not present

## 2018-11-02 DIAGNOSIS — Z9989 Dependence on other enabling machines and devices: Secondary | ICD-10-CM | POA: Diagnosis not present

## 2018-11-02 NOTE — Progress Notes (Addendum)
Community message sent to [employer] re: needs new mask and supplies. Received reply, order received.

## 2018-11-02 NOTE — Patient Instructions (Addendum)
  CPAP compliance 87% Needs mask refit Continue same settings Will order supplies F/U in 6 months

## 2018-11-05 ENCOUNTER — Other Ambulatory Visit: Payer: Self-pay | Admitting: Adult Health

## 2018-11-12 ENCOUNTER — Ambulatory Visit (INDEPENDENT_AMBULATORY_CARE_PROVIDER_SITE_OTHER): Payer: Medicare Other | Admitting: Psychiatry

## 2018-11-12 ENCOUNTER — Ambulatory Visit: Payer: Medicare Other | Admitting: Psychiatry

## 2018-11-12 DIAGNOSIS — F331 Major depressive disorder, recurrent, moderate: Secondary | ICD-10-CM

## 2018-11-12 NOTE — Progress Notes (Signed)
      [facility] Counselor/Therapist Progress Note  Patient ID: Cody Hines., MRN: [id number],    Date: 11/12/2018  Time Spent:  58 minutes  Treatment Type: Individual Therapy  Reported Symptoms:   Some anxiety and depression, irritable, frustrated  Mental Status Exam:  Appearance:   Casual     Behavior:  Appropriate and Sharing  Motor:  Normal  Speech/Language:   Normal Rate  Affect:  Congruent  Mood:  anxious and depressed, irritable  Thought process:  normal  Thought content:    WNL  Sensory/Perceptual disturbances:    WNL  Orientation:  oriented to person, place, time/date, situation, day of week, month of year and year  Attention:  Good  Concentration:  Good  Memory:  WNL  Fund of knowledge:   Good  Insight:    Fair  Judgment:   Good  Impulse Control:  Good   Risk Assessment: Danger to Self:  No Self-injurious Behavior: No Danger to Others: No Duty to Warn:no Physical Aggression / Violence:No  Access to Firearms a concern: No  Gang Involvement:No   Subjective:  Patient in today with symptoms noted above.  He acknowledges that rainy darker weather does sometimes affect his mood as far as increased depressed mood.   Still some sharp feelings in regards to separated wife, and was able to process some of that today without getting stuck in negativity.  Reports that he knows he is better over-all as he doesn't personalize as much in his relationship with others and isn't quite as negative.    Had a frustrating experience with a neighbor this past week.  Very frustrating and he actually handled it well with neighbor.   Patient was given a printout of CBT diagram in prior session and we followed up on that again today, emphasizing his ability to be proactive and choosing to be more positive,  leading to feeling better which likely could lead him to more positives behaviorally.  Easier to talk about in session but more difficult to follow through on outside of  session. Did call his daughter and lined up a visit with her and grandson, which went well.  Interventions:  Cognitive Behavioral / Solution-focused  Diagnosis:   ICD-10-CM   1. Major depressive disorder, recurrent episode, moderate ([place]) F33.1     Plan:  Patient will follow up on CBT strategies info provided earlier to him.  Also to consider ways that he can be around other people more.  To return in 1-2 weeks to continue goal-directed treatment.   Shanon Ace, LCSW

## 2018-11-19 ENCOUNTER — Ambulatory Visit (INDEPENDENT_AMBULATORY_CARE_PROVIDER_SITE_OTHER): Payer: Medicare Other | Admitting: Psychiatry

## 2018-11-19 DIAGNOSIS — F331 Major depressive disorder, recurrent, moderate: Secondary | ICD-10-CM

## 2018-11-19 NOTE — Progress Notes (Signed)
      [facility] Counselor/Therapist Progress Note  Patient ID: Cody Hines., MRN: [id number],    Date: 11/19/2018  Time Spent:  60 minutes  Treatment Type: Individual Therapy  Reported Symptoms:   "Some anxiety and depression"  Mental Status Exam:  Appearance:   Casual     Behavior:  Appropriate and Sharing  Motor:  Normal  Speech/Language:   Normal Rate  Affect:  Congruent  Mood:  anxious and depressed,  Thought process:  normal  Thought content:    WNL  Sensory/Perceptual disturbances:    WNL  Orientation:  oriented to person, place, time/date, situation, day of week, month of year and year  Attention:  Good  Concentration:  Good  Memory:  WNL  Fund of knowledge:   Good  Insight:    Fair  Judgment:   Good  Impulse Control:  Good   Risk Assessment: Danger to Self:  No Self-injurious Behavior: No Danger to Others: No Duty to Warn:no Physical Aggression / Violence:No  Access to Firearms a concern: No  Gang Involvement:No   Subjective:  Patient in today with symptoms noted above.  Shares that he is more challenged with negativity when weather is rainy and cloudy---the past few days have been quite rainy and patient's mood was some lower than at last appt.  Demonstrates some better frustration tolerance at times as evidenced by patient sharing some frustrating events that happen and being able to laugh them off rather than getting stuck in frustration/anger.  This is a newer, healthier behavior for patient.   Some sharp feelings re: separated wife, and was able to process more of those today without getting stuck in negativity.  Continues to work on not personalizing things, whether it's in relation to former wife or other issues.   Patient reports continued use of CBT diagram given to him in a prior session, focusing on making the choice to have a more positive outlook that ends up positively affecting his feelings and behavior.  Reports anxiety and depression  are lessening.  Interventions:  Cognitive Behavioral / Solution-focused  Diagnosis:   ICD-10-CM   1. Major depressive disorder, recurrent episode, moderate ([place]) F33.1     Plan:  Patient will work on CBT strategies as well as improved self care (healthier eating, trying to have more connections to people). Also to consider ways that he can be more socially connected with other people.  To return in 1-2 weeks to continue goal-directed treatment.   Shanon Ace, LCSW

## 2018-11-26 ENCOUNTER — Ambulatory Visit (INDEPENDENT_AMBULATORY_CARE_PROVIDER_SITE_OTHER): Payer: Medicare Other | Admitting: Psychiatry

## 2018-11-26 DIAGNOSIS — F331 Major depressive disorder, recurrent, moderate: Secondary | ICD-10-CM | POA: Diagnosis not present

## 2018-11-26 NOTE — Progress Notes (Signed)
      [facility] Counselor/Therapist Progress Note  Patient ID: Cody Hines., MRN: [id number],    Date: 11/26/2018  Time Spent:  58 minutes  Treatment Type: Individual Therapy  Reported Symptoms:  Anxiety, depression (improved some)   Mental Status Exam:  Appearance:   Casual     Behavior:  Appropriate and Sharing  Motor:  Normal  Speech/Language:   Normal Rate  Affect:  Congruent  Mood:  Low grade depression; Anxiety   Thought process:  normal  Thought content:    WNL  Sensory/Perceptual disturbances:    WNL  Orientation:  oriented to person, place, time/date, situation, day of week, month of year and year  Attention:  Good  Concentration:  Good  Memory:  WNL  Fund of knowledge:   Good  Insight:    Fair  Judgment:   Good  Impulse Control:  Good   Risk Assessment: Danger to Self:  No Self-injurious Behavior: No Danger to Others: No Duty to Warn:no Physical Aggression / Violence:No  Access to Firearms a concern: No  Gang Involvement:No   Subjective:  Patient in today with symptoms noted above.    Demonstrates some better frustration tolerance at times as evidenced by patient sharing some frustrating events that happened and being able to not get stuck in frustration/anger.  Definitely a healthier behavior for patient and one that he is working on increasing.   Still some sharp feelings re: separated wife, and was able to process more of those today without getting stuck in negativity. Working to stay more in the present rather the past. Continues to work on not personalizing things, whether it's in relation to former wife or other issues.   Patient reports continued use of CBT diagram given to him in a prior session, focusing on making the choice to have a more positive outlook that ends up positively affecting his feelings and behavior.  Reports some gradual decrease in anxiety and depression.   Interventions:  Cognitive Behavioral /  Solution-focused/Supportive  Diagnosis:   ICD-10-CM   1. Major depressive disorder, recurrent episode, moderate ([place]) F33.1     Plan:  Patient will work on CBT strategies as well as improved self care (healthier eating, trying to have more connections to people).  Continue to work on staying in the present and practice looking for things to go well rather than not going well.  Also to consider ways that he can be more socially connected with other people.  To return in 1-2 weeks to continue goal-directed treatment.   Shanon Ace, LCSW

## 2018-12-03 ENCOUNTER — Ambulatory Visit (INDEPENDENT_AMBULATORY_CARE_PROVIDER_SITE_OTHER): Payer: Medicare Other | Admitting: Psychiatry

## 2018-12-03 DIAGNOSIS — F331 Major depressive disorder, recurrent, moderate: Secondary | ICD-10-CM

## 2018-12-03 NOTE — Progress Notes (Signed)
      [facility] Counselor/Therapist Progress Note  Patient ID: Cody Hines., MRN: [id number],    Date: 12/03/2018  Time Spent:  60  minutes  Treatment Type: Individual Therapy  Reported Symptoms:  Anxiety, depression (improved some)   Mental Status Exam:  Appearance:   Casual     Behavior:  Appropriate and Sharing  Motor:  Normal  Speech/Language:   Normal Rate  Affect:  Congruent  Mood:  some depression; Anxiety   Thought process:  normal  Thought content:    WNL  Sensory/Perceptual disturbances:    WNL  Orientation:  oriented to person, place, time/date, situation, day of week, month of year and year  Attention:  Good  Concentration:  Good  Memory:  WNL  Fund of knowledge:   Good  Insight:    Fair  Judgment:   Good  Impulse Control:  Good   Risk Assessment: Danger to Self:  No Self-injurious Behavior: No Danger to Others: No Duty to Warn:no Physical Aggression / Violence:No  Access to Firearms a concern: No  Gang Involvement:No   Subjective:  Patient in today with some depressed mood and anxiety.  Continues to work on frustration tolerance and discerning what he can change and the things he cannot change.  Working to develop more patience with other people and situations.    Still some sharp feelings re: separated wife, and trying to let go of old, more depressive thinking patterns.  Also trying to stay more in the present rather the past.  Continues to work on not personalizing things, whether it's in relation to former wife or other issues.  Near session end, patient pulled a card out of his pocket and it was a card he had made with thoughts on it that reminded him of CBT strategies.  Patient states "I start my day asking for God to help me get through the day."  Also reports continued use of CBT diagram given to him in a prior session, focusing on making the choice to have a more positive outlook that ends up positively affecting his feelings and behavior,  andadmits t's not coming naturally to him yet. Reports some gradual decrease in anxiety and depression. Does  smile more often and remaining in contact with his adult children.  Goal review and progress noted.  Interventions:  Cognitive Behavioral / Solution-focused/Supportive  Diagnosis:   ICD-10-CM   1. Major depressive disorder, recurrent episode, moderate ([place]) F33.1     Plan:  Patient will continue work on CBT strategies as well as improved self care (healthier eating, trying to have more connections to people).  Continue to work on staying in the present and practice looking for things to go well rather than not going well.  Also to consider ways that he can be more socially connected with other people.  To return in 1-2 weeks to continue goal-directed treatment.   Shanon Ace, LCSW

## 2018-12-10 ENCOUNTER — Ambulatory Visit (INDEPENDENT_AMBULATORY_CARE_PROVIDER_SITE_OTHER): Payer: Medicare Other | Admitting: Psychiatry

## 2018-12-10 DIAGNOSIS — F331 Major depressive disorder, recurrent, moderate: Secondary | ICD-10-CM

## 2018-12-10 NOTE — Progress Notes (Signed)
      [facility] Counselor/Therapist Progress Note  Patient ID: Cody Maahs., MRN: [id number],    Date: 12/10/2018  Time Spent:  60  minutes  Treatment Type: Individual Therapy  Reported Symptoms:  Anxiety, depression, "aggravation"  Mental Status Exam:  Appearance:   Casual     Behavior:  Appropriate and Sharing  Motor:  Normal  Speech/Language:   Normal Rate  Affect:  Congruent  Mood:  some depression; Anxiety   Thought process:  normal  Thought content:    WNL  Sensory/Perceptual disturbances:    WNL  Orientation:  oriented to person, place, time/date, situation, day of week, month of year and year  Attention:  Good  Concentration:  Good  Memory:  WNL  Fund of knowledge:   Good  Insight:    Fair  Judgment:   Good  Impulse Control:  Good   Risk Assessment: Danger to Self:  No Self-injurious Behavior: No Danger to Others: No Duty to Warn:no Physical Aggression / Violence:No  Access to Firearms a concern: No  Gang Involvement:No   Subjective:  Patient in today with some increase in depression and anxiety.  Continues to work on frustration tolerance and discerning what he can change and the things he cannot change.  Really difficult for him to let go and not hold onto any negative comments and not "keep score" in regards to interactions with separated wife.   Still has sharp feelings re: separated wife, and trying to let go of old, more depressive thinking patterns.   Trying to stay more in the present rather the past.  Struggling to not personalize things, whether it's in relation to former wife or other issues.  Difficult for patient, especially after another flare-up with or from separated wife.  Reflected on last session where patient pulled a card out of his pocket and it was a card he had made with thoughts on it that reminded him of CBT strategies.  Patient states "I start my day asking for God to help me get through the day."  Trying to focus on making the  choice to have a more positive outlook that ends up positively affecting his feelings and behavior.  Admits "it is not coming naturally to him yet."    Towards end of session was some more upbeat and able to use some humor in mentioning his grandkids.   Goal review and progress noted.  Interventions:  Cognitive Behavioral / Solution-focused/Supportive  Diagnosis:   ICD-10-CM   1. Major depressive disorder, recurrent episode, moderate ([place]) F33.1     Plan:  Patient will continue work on CBT strategies as well as improved self care (healthier eating, trying to have more connections to people).  Continue to work on staying in the present and practice looking for things to go well rather than not going well.  Also to consider ways that he can be more socially connected with other people.  To return in 1-2 weeks to continue goal-directed treatment.   Shanon Ace, LCSW

## 2018-12-14 ENCOUNTER — Ambulatory Visit: Payer: Medicare Other | Admitting: Psychiatry

## 2018-12-14 DIAGNOSIS — H04123 Dry eye syndrome of bilateral lacrimal glands: Secondary | ICD-10-CM | POA: Diagnosis not present

## 2018-12-14 DIAGNOSIS — H10413 Chronic giant papillary conjunctivitis, bilateral: Secondary | ICD-10-CM | POA: Diagnosis not present

## 2018-12-14 DIAGNOSIS — H2513 Age-related nuclear cataract, bilateral: Secondary | ICD-10-CM | POA: Diagnosis not present

## 2018-12-14 DIAGNOSIS — H17822 Peripheral opacity of cornea, left eye: Secondary | ICD-10-CM | POA: Diagnosis not present

## 2018-12-17 ENCOUNTER — Encounter: Payer: Self-pay | Admitting: Internal Medicine

## 2018-12-17 ENCOUNTER — Ambulatory Visit (INDEPENDENT_AMBULATORY_CARE_PROVIDER_SITE_OTHER): Payer: Medicare Other | Admitting: Internal Medicine

## 2018-12-17 ENCOUNTER — Other Ambulatory Visit: Payer: Self-pay

## 2018-12-17 VITALS — BP 138/82 | HR 68 | Temp 97.8°F | Resp 18 | Ht 73.0 in | Wt 219.0 lb

## 2018-12-17 DIAGNOSIS — Z125 Encounter for screening for malignant neoplasm of prostate: Secondary | ICD-10-CM | POA: Diagnosis not present

## 2018-12-17 DIAGNOSIS — Z1212 Encounter for screening for malignant neoplasm of rectum: Secondary | ICD-10-CM

## 2018-12-17 DIAGNOSIS — Z79899 Other long term (current) drug therapy: Secondary | ICD-10-CM

## 2018-12-17 DIAGNOSIS — N183 Chronic kidney disease, stage 3 unspecified: Secondary | ICD-10-CM

## 2018-12-17 DIAGNOSIS — E782 Mixed hyperlipidemia: Secondary | ICD-10-CM

## 2018-12-17 DIAGNOSIS — I10 Essential (primary) hypertension: Secondary | ICD-10-CM

## 2018-12-17 DIAGNOSIS — E1122 Type 2 diabetes mellitus with diabetic chronic kidney disease: Secondary | ICD-10-CM | POA: Diagnosis not present

## 2018-12-17 DIAGNOSIS — Z136 Encounter for screening for cardiovascular disorders: Secondary | ICD-10-CM

## 2018-12-17 DIAGNOSIS — Z1211 Encounter for screening for malignant neoplasm of colon: Secondary | ICD-10-CM

## 2018-12-17 DIAGNOSIS — K219 Gastro-esophageal reflux disease without esophagitis: Secondary | ICD-10-CM

## 2018-12-17 DIAGNOSIS — N401 Enlarged prostate with lower urinary tract symptoms: Secondary | ICD-10-CM | POA: Diagnosis not present

## 2018-12-17 DIAGNOSIS — E559 Vitamin D deficiency, unspecified: Secondary | ICD-10-CM

## 2018-12-17 DIAGNOSIS — Z8249 Family history of ischemic heart disease and other diseases of the circulatory system: Secondary | ICD-10-CM

## 2018-12-17 DIAGNOSIS — E349 Endocrine disorder, unspecified: Secondary | ICD-10-CM

## 2018-12-17 DIAGNOSIS — R7303 Prediabetes: Secondary | ICD-10-CM

## 2018-12-17 DIAGNOSIS — Z87891 Personal history of nicotine dependence: Secondary | ICD-10-CM

## 2018-12-17 DIAGNOSIS — Z9989 Dependence on other enabling machines and devices: Secondary | ICD-10-CM

## 2018-12-17 DIAGNOSIS — G4733 Obstructive sleep apnea (adult) (pediatric): Secondary | ICD-10-CM

## 2018-12-17 NOTE — Progress Notes (Signed)
[facility]   [name], M.D.     Uvaldo Bristle. Silverio Lay, P.A.-C Liane Comber, [place]                [address] [place] [address] Telephone [phone] Telefax [phone]  Comprehensive Evaluation & Examination     This very nice 73 y.o. sep. WM presents for a comprehensive evaluation and management of multiple medical co-morbidities.  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 Patient has OSA on CPAP managed by Dr Brett Fairy. Patient  reports improved sleep hygiene.      Patient is still followed at  [facility] by Dr Clovis Pu & associates  for Depression.     HTN predates circa 1993. Patient's BP has been controlled at home.  Today's BP is at goal - 138/82. Patient denies any cardiac symptoms as chest pain, palpitations, shortness of breath, dizziness or ankle swelling.     Patient's hyperlipidemia is controlled with diet and medications. Patient denies myalgias or other medication SE's. Last lipids were at goal: Lab Results  Component Value Date   CHOL 119 12/17/2018   HDL 52 12/17/2018   LDLCALC 51 12/17/2018   TRIG 77 12/17/2018   CHOLHDL 2.3 12/17/2018      Patient has hx/o T2_NIDDM (2015) w/CKD3 and patient denies reactive hypoglycemic symptoms, visual blurring, diabetic polys or paresthesias. Last A1c was not at goal: Lab Results  Component Value Date   HGBA1C 6.1 (H) 12/17/2018       Finally, patient has history of Vitamin D Deficiency ("29" / 2008) and last vitamin D was still low (goal 70-100):  Lab Results  Component Value Date   VD25OH 48 12/17/2018   Current Outpatient Medications on File Prior to Visit  Medication Sig  . aspirin EC 81 MG tablet Take 81 mg by mouth daily.  . enalapril (VASOTEC) 20 MG tablet TAKE 1 TABLET BY MOUTH  EVERY DAY FOR BLOOD  PRESSURE  . Flaxseed, Linseed, (FLAX SEED OIL) 1000 MG CAPS Take 2  capsules by mouth daily.   Marland Kitchen LORazepam (ATIVAN) 1 MG tablet Take 1 tablet (1 mg total) by mouth at bedtime. (Patient taking differently: Take 1 mg by mouth as needed. )  . Magnesium 500 MG TABS Take by mouth daily.  Marland Kitchen OVER THE COUNTER MEDICATION 5,000 Units. Vitamin D  . rosuvastatin (CRESTOR) 40 MG tablet TAKE 1 TABLET BY MOUTH  DAILY FOR CHOLESTEROL  . sertraline (ZOLOFT) 100 MG tablet Take 2 tablets (200 mg total) by mouth daily.   No current facility-administered medications on file prior to visit.    Allergies  Allergen Reactions  . Asa [Aspirin] Diarrhea    Nausea, hx ulcers  . Atorvastatin     Muscle aches  . Celebrex [Celecoxib]   . Codeine   . Penicillins   . Savella [Milnacipran Hcl]   . Viagra [Sildenafil Citrate] Other (See Comments)    Headache   Past Medical History:  Diagnosis Date  . BPH (benign prostatic hyperplasia)   . Cancer (HCC)    nose, skin cancer  . Elevated hemoglobin A1c   . Fibromyalgia   . GERD (gastroesophageal reflux disease)   . Hyperlipidemia   . Hypertension   . Hypogonadism male   . IBS (irritable bowel syndrome)   . OSA (obstructive sleep apnea)  Health Maintenance  Topic Date Due  . INFLUENZA VACCINE  01/04/2019 (Originally 05/06/2018)  . PNA vac Low Risk Adult (2 of 2 - PPSV23) 09/18/2019  . TETANUS/TDAP  01/14/2020  . COLONOSCOPY  04/23/2023  . Hepatitis C Screening  Completed   Immunization History  Administered Date(s) Administered  . Pneumococcal Conjugate-13 09/17/2018  . Pneumococcal-Unspecified 07/16/2011  . Tdap 01/13/2010  . Zoster 08/09/2013   Last Colon - 04/22/2013 - Dorinda Hill recommend 5 yr f/u done 11/20/2017  Past Surgical History:  Procedure Laterality Date  .  nasal smr np3  1985  . KNEE ARTHROSCOPY Left 1999  . SKIN CANCER EXCISION  2020   nose  . SPINE SURGERY  2007   L5 S 1 Disk  . VASECTOMY  1983   Family History  Problem Relation Age of Onset  . Cancer Mother        breast  . Cancer Father         lung  . Diabetes Father   . Heart disease Sister   . Arthritis Sister    Social History   Socioeconomic History  . Marital status: Legally Separated    Spouse name: Kennyth Lose  . Number of children: Not on file  Occupational History  . 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 Smoking status: Former Smoker    Years: 30.00    Types: Cigarettes    Last attempt to quit: 10/06/1980    Years since quitting: 38.2  . Smokeless tobacco: Never Used  Substance and Sexual Activity  . Alcohol use: No    Frequency: Never    Comment: Not drinking x 1 month  . Drug use: No  . Sexual activity: Not Currently    ROS Constitutional: Denies fever, chills, weight loss/gain, headaches, insomnia,  night sweats or change in appetite. Does c/o fatigue. Eyes: Denies redness, blurred vision, diplopia, discharge, itchy or watery eyes.  ENT: Denies discharge, congestion, post nasal drip, epistaxis, sore throat, earache, hearing loss, dental pain, Tinnitus, Vertigo, Sinus pain or snoring.  Cardio: Denies chest pain, palpitations, irregular heartbeat, syncope, dyspnea, diaphoresis, orthopnea, PND, claudication or edema Respiratory: denies cough, dyspnea, DOE, pleurisy, hoarseness, laryngitis or wheezing.  Gastrointestinal: Denies dysphagia, heartburn, reflux, water brash, pain, cramps, nausea, vomiting, bloating, diarrhea, constipation, hematemesis, melena, hematochezia, jaundice or hemorrhoids Genitourinary: Denies dysuria, frequency, urgency, nocturia, hesitancy, discharge, hematuria or flank pain Musculoskeletal: Denies arthralgia, myalgia, stiffness, Jt. Swelling, pain, limp or strain/sprain. Denies Falls. Skin: Denies puritis, rash, hives, warts, acne, eczema or change in skin lesion Neuro: No weakness, tremor, incoordination, spasms, paresthesia or pain Psychiatric: Denies confusion, memory loss or sensory loss. Denies Depression. Endocrine: Denies change in weight, skin, hair change, nocturia, and  paresthesia, diabetic polys, visual blurring or hyper / hypo glycemic episodes.  Heme/Lymph: No excessive bleeding, bruising or enlarged lymph nodes.  Physical Exam  BP 138/82   Pulse 68   Temp 97.8 F (36.6 C)   Resp 18   Ht 6\' 1"  (1.854 m)   Wt 219 lb (99.3 kg)   BMI 28.89 kg/m   General Appearance: Well nourished and well groomed and in no apparent distress.  Eyes: PERRLA, EOMs, conjunctiva no swelling or erythema, normal fundi and vessels. Sinuses: No frontal/maxillary tenderness ENT/Mouth: EACs patent / TMs  nl. Nares clear without erythema, swelling, mucoid exudates. Oral hygiene is good. No erythema, swelling, or exudate. Tongue normal, non-obstructing. Tonsils not swollen or erythematous. Hearing normal.  Neck: Supple, thyroid not palpable. No bruits, nodes  or JVD. Respiratory: Respiratory effort normal.  BS equal and clear bilateral without rales, rhonci, wheezing or stridor. Cardio: Heart sounds are normal with regular rate and rhythm and no murmurs, rubs or gallops. Peripheral pulses are normal and equal bilaterally without edema. No aortic or femoral bruits. Chest: symmetric with normal excursions and percussion.  Abdomen: Soft, with Nl bowel sounds. Nontender, no guarding, rebound, hernias, masses, or organomegaly.  Lymphatics: Non tender without lymphadenopathy.  Musculoskeletal: Full ROM all peripheral extremities, joint stability, 5/5 strength, and normal gait. Skin: Warm and dry without rashes, lesions, cyanosis, clubbing or  ecchymosis.  Neuro: Cranial nerves intact, reflexes equal bilaterally. Normal muscle tone, no cerebellar symptoms. Sensation intact.  Pysch: Alert and oriented X 3 with normal affect, insight and judgment appropriate.   Assessment and Plan  1. Essential hypertension  - EKG 12-Lead - [place], RETROPERITNL ABD,  LTD - Urinalysis, Routine w reflex microscopic - Microalbumin / creatinine urine ratio - CBC with Differential/Platelet - COMPLETE  METABOLIC PANEL WITH GFR - Magnesium - TSH  2. Hyperlipidemia, mixed  - EKG 12-Lead - [place], RETROPERITNL ABD,  LTD - Lipid panel - TSH  3. Type 2 diabetes mellitus with stage 3 chronic kidney disease, without long-term current use of insulin (HCC)  - EKG 12-Lead - [place], RETROPERITNL ABD,  LTD - Hemoglobin A1c - Insulin, random  4. Vitamin D deficiency  - VITAMIN D 25 Hydroxyl  5. Gastroesophageal reflux disease  - CBC with Differential/Platelet  6. OSA on CPAP  7. Screening for colorectal cancer  - POC Hemoccult Bld/Stl  8. Benign localized prostatic hyperplasia with lower urinary tract symptoms (LUTS)  - PSA  9. Prostate cancer screening  - PSA  10. Screening for ischemic heart disease  - EKG 12-Lead  11. Family history of ischemic heart disease  - EKG 12-Lead - [place], RETROPERITNL ABD,  LTD  12. Former smoker  - EKG 12-Lead - [place], RETROPERITNL ABD,  LTD  13. Screening for AAA (aortic abdominal aneurysm)  - [place], RETROPERITNL ABD,  LTD  14. Medication management  - Urinalysis, Routine w reflex microscopic - Microalbumin / creatinine urine ratio - CBC with Differential/Platelet - COMPLETE METABOLIC PANEL WITH GFR - Magnesium - Lipid panel - TSH - Hemoglobin A1c - Insulin, random - VITAMIN D 25 Hydroxyl       Patient was counseled in prudent diet, weight control to achieve/maintain BMI less than 25, BP monitoring, regular exercise and medications as discussed.  Discussed med effects and SE's. Routine screening labs and tests as requested with regular follow-up as recommended. Over 40 minutes of exam, counseling, chart review and high complex critical decision making was performed

## 2018-12-17 NOTE — Patient Instructions (Signed)

## 2018-12-19 ENCOUNTER — Encounter: Payer: Self-pay | Admitting: Internal Medicine

## 2018-12-20 ENCOUNTER — Other Ambulatory Visit: Payer: Self-pay

## 2018-12-20 ENCOUNTER — Ambulatory Visit (INDEPENDENT_AMBULATORY_CARE_PROVIDER_SITE_OTHER): Payer: Medicare Other | Admitting: Psychiatry

## 2018-12-20 DIAGNOSIS — F331 Major depressive disorder, recurrent, moderate: Secondary | ICD-10-CM

## 2018-12-20 LAB — CBC WITH DIFFERENTIAL/PLATELET
ABSOLUTE MONOCYTES: 396 {cells}/uL (ref 200–950)
Basophils Absolute: 28 cells/uL (ref 0–200)
Basophils Relative: 0.6 %
EOS ABS: 120 {cells}/uL (ref 15–500)
Eosinophils Relative: 2.6 %
HCT: 44.1 % (ref 38.5–50.0)
Hemoglobin: 14.6 g/dL (ref 13.2–17.1)
Lymphs Abs: 1578 cells/uL (ref 850–3900)
MCH: 29.8 pg (ref 27.0–33.0)
MCHC: 33.1 g/dL (ref 32.0–36.0)
MCV: 90 fL (ref 80.0–100.0)
MPV: 11.4 fL (ref 7.5–12.5)
Monocytes Relative: 8.6 %
Neutro Abs: 2479 cells/uL (ref 1500–7800)
Neutrophils Relative %: 53.9 %
Platelets: 156 10*3/uL (ref 140–400)
RBC: 4.9 10*6/uL (ref 4.20–5.80)
RDW: 13.2 % (ref 11.0–15.0)
Total Lymphocyte: 34.3 %
WBC: 4.6 10*3/uL (ref 3.8–10.8)

## 2018-12-20 LAB — LIPID PANEL
Cholesterol: 119 mg/dL (ref <200)
HDL: 52 mg/dL (ref >=40)
LDL CHOLESTEROL (CALC): 51 mg/dL
Non-HDL Cholesterol (Calc): 67 mg/dL (calc) (ref <130)
Total CHOL/HDL Ratio: 2.3 (calc) (ref <5.0)
Triglycerides: 77 mg/dL (ref <150)

## 2018-12-20 LAB — MICROALBUMIN / CREATININE URINE RATIO
Creatinine, Urine: 88 mg/dL (ref 20–320)
Microalb Creat Ratio: 5 mcg/mg creat (ref <30)
Microalb, Ur: 0.4 mg/dL

## 2018-12-20 LAB — COMPLETE METABOLIC PANEL WITH GFR
AG Ratio: 1.7 (calc) (ref 1.0–2.5)
ALT: 25 U/L (ref 9–46)
AST: 41 U/L — ABNORMAL HIGH (ref 10–35)
Albumin: 4.3 g/dL (ref 3.6–5.1)
Alkaline phosphatase (APISO): 91 U/L (ref 35–144)
BUN/Creatinine Ratio: 6 (calc) (ref 6–22)
BUN: 9 mg/dL (ref 7–25)
CO2: 30 mmol/L (ref 20–32)
Calcium: 9.6 mg/dL (ref 8.6–10.3)
Chloride: 104 mmol/L (ref 98–110)
Creat: 1.42 mg/dL — ABNORMAL HIGH (ref 0.70–1.18)
GFR, Est African American: 58 mL/min/{1.73_m2} — ABNORMAL LOW (ref >=60)
GFR, Est Non African American: 50 mL/min/{1.73_m2} — ABNORMAL LOW (ref >=60)
GLOBULIN: 2.6 g/dL (ref 1.9–3.7)
Glucose, Bld: 207 mg/dL — ABNORMAL HIGH (ref 65–99)
Potassium: 3.8 mmol/L (ref 3.5–5.3)
Sodium: 140 mmol/L (ref 135–146)
Total Bilirubin: 1.1 mg/dL (ref 0.2–1.2)
Total Protein: 6.9 g/dL (ref 6.1–8.1)

## 2018-12-20 LAB — URINALYSIS, ROUTINE W REFLEX MICROSCOPIC
BILIRUBIN URINE: NEGATIVE
Hgb urine dipstick: NEGATIVE
Ketones, ur: NEGATIVE
Leukocytes,Ua: NEGATIVE
Nitrite: NEGATIVE
PH: 5.5 (ref 5.0–8.0)
Protein, ur: NEGATIVE
Specific Gravity, Urine: 1.013 (ref 1.001–1.03)

## 2018-12-20 LAB — VITAMIN D 25 HYDROXY (VIT D DEFICIENCY, FRACTURES): Vit D, 25-Hydroxy: 48 ng/mL (ref 30–100)

## 2018-12-20 LAB — PSA: PSA: 0.7 ng/mL (ref <=4.0)

## 2018-12-20 LAB — TSH: TSH: 1.91 mIU/L (ref 0.40–4.50)

## 2018-12-20 LAB — HEMOGLOBIN A1C
Hgb A1c MFr Bld: 6.1 % of total Hgb — ABNORMAL HIGH (ref <5.7)
MEAN PLASMA GLUCOSE: 128 (calc)
eAG (mmol/L): 7.1 (calc)

## 2018-12-20 LAB — INSULIN, RANDOM: Insulin: 23.3 u[IU]/mL — ABNORMAL HIGH

## 2018-12-20 LAB — MAGNESIUM: Magnesium: 2.3 mg/dL (ref 1.5–2.5)

## 2018-12-20 NOTE — Progress Notes (Signed)
      [facility] Counselor/Therapist Progress Note  Patient ID: Cody Hines., MRN: [id number],    Date: 12/20/2018  Time Spent:  58  minutes  Treatment Type: Individual Therapy  Reported Symptoms:  Anxiety, depression   Mental Status Exam:  Appearance:   Casual     Behavior:  Appropriate and Sharing  Motor:  Normal  Speech/Language:   Normal Rate  Affect:  Congruent  Mood:  some depression; Anxiety   Thought process:  normal  Thought content:    WNL  Sensory/Perceptual disturbances:    WNL  Orientation:  oriented to person, place, time/date, situation, day of week, month of year and year  Attention:  Good  Concentration:  Good  Memory:  WNL  Fund of knowledge:   Good  Insight:    Fair  Judgment:   Good  Impulse Control:  Good   Risk Assessment: Danger to Self:  No Self-injurious Behavior: No Danger to Others: No Duty to Warn:no Physical Aggression / Violence:No  Access to Firearms a concern: No  Gang Involvement:No   Subjective:  Patient in today to continuing working on his depression and anxiety.  Focused today more on frustration tolerance and discerning what he can change and the things he cannot change.  The frustration tolerance is challenging for patient and hard for him to acknowledge although other family notices it.  Also quite difficult for him to let go and not hold onto any negative comments and not "keep score" in regards to interactions with separated wife.   Not sure when he will move forward, as he and separated wife have still not divorced but he doesn't feel there's a chance with either  of them wanting to remain involved with each other. Patient showing some brighter affect at times but can quickly change when something goes against the way patient feels.   Working more today on making the choice to have a more positive outlook that ends up positively affecting his feelings and behavior.  Admits "it is not coming naturally to him yet" and  assured that is ok, that with practice it can begin to feel more natural.    Interventions:  Cognitive Behavioral / Solution-focused/Supportive  Diagnosis:   ICD-10-CM   1. Major depressive disorder, recurrent episode, moderate ([place]) F33.1     Plan:  Patient will continue work on CBT strategies as well as improved self care (healthier eating, trying to have more connections to people).  Continue to work on staying in the present and practice looking for things to go well rather than not going well.  Also to consider ways that he can be more socially connected with other people.  To return in 1-2 weeks to continue goal-directed treatment.   Shanon Ace, LCSW

## 2018-12-21 ENCOUNTER — Other Ambulatory Visit: Payer: Self-pay

## 2018-12-21 ENCOUNTER — Encounter: Payer: Self-pay | Admitting: Psychiatry

## 2018-12-21 ENCOUNTER — Ambulatory Visit (INDEPENDENT_AMBULATORY_CARE_PROVIDER_SITE_OTHER): Payer: Medicare Other | Admitting: Psychiatry

## 2018-12-21 DIAGNOSIS — F6381 Intermittent explosive disorder: Secondary | ICD-10-CM | POA: Diagnosis not present

## 2018-12-21 DIAGNOSIS — F411 Generalized anxiety disorder: Secondary | ICD-10-CM | POA: Diagnosis not present

## 2018-12-21 DIAGNOSIS — F1021 Alcohol dependence, in remission: Secondary | ICD-10-CM

## 2018-12-21 DIAGNOSIS — F331 Major depressive disorder, recurrent, moderate: Secondary | ICD-10-CM | POA: Diagnosis not present

## 2018-12-21 NOTE — Progress Notes (Signed)
Cody Hines [id number] Mar 05, 1946 73 y.o.  Subjective:   Patient ID:  Cody Hines. is a 73 y.o. (DOB 1946/05/22) male.  Chief Complaint:  Chief Complaint  Patient presents with  . Follow-up    Medication Management  . Depression    HPI  Last visit November 19. Cody Hines. presents to the office today for follow-up mood problems.  No med changes at last visit.  He's continued counseling.  Overall better with occ spells of being down but gets through them.  Wants to see gkids more and did recently and that helped.  Most days sometimes in the day feels depressed but doesn't last.  Lonely.  Work 2 days weekly and visits sisters.   Overall is improved but sx are not resolved. Pt reports that mood is Depressed and describes anxiety as Moderate. Anxiety symptoms include: Excessive Worry,. Anxiety in large groups of people has to get out.  Pt reports has interrupted sleep. Better sleep when not working. Goes to bed early. Usually enough sleep.  Pt reports that appetite is good. Pt reports that energy is no change and good. Better than it was.   Concentration is good. Suicidal thoughts:  denied by patient. Trying to stay active helps.  Less angry and resentful of others than he used to be. Outbursts are generally resolved.  Still separated and unlikely to reconcile. Able to control myself.  Anger much better even with triggers of wife provoking him.  OK at work too.  Sober 2 years.  Past Psychiatric Medication Trials: citalopram, Wellbutrin,  Xanax.   Review of Systems:  Review of Systems  Respiratory: Negative for cough.   Neurological: Positive for tremors and headaches. Negative for weakness.  Psychiatric/Behavioral: Positive for dysphoric mood. Negative for agitation, behavioral problems, confusion, decreased concentration, hallucinations, self-injury, sleep disturbance and suicidal ideas. The patient is nervous/anxious. The patient is not hyperactive.    Increase in migraine HA.  Medications: I have reviewed the patient's current medications.  Current Outpatient Medications  Medication Sig Dispense Refill  . aspirin EC 81 MG tablet Take 81 mg by mouth daily.    . enalapril (VASOTEC) 20 MG tablet TAKE 1 TABLET BY MOUTH  EVERY DAY FOR BLOOD  PRESSURE 120 tablet 0  . Flaxseed, Linseed, (FLAX SEED OIL) 1000 MG CAPS Take 2 capsules by mouth daily.     Marland Kitchen LORazepam (ATIVAN) 1 MG tablet Take 1 tablet (1 mg total) by mouth at bedtime. (Patient taking differently: Take 1 mg by mouth as needed. ) 30 tablet 5  . Magnesium 500 MG TABS Take by mouth daily.    Marland Kitchen OVER THE COUNTER MEDICATION 5,000 Units. Vitamin D    . rosuvastatin (CRESTOR) 40 MG tablet TAKE 1 TABLET BY MOUTH  DAILY FOR CHOLESTEROL 120 tablet 0  . sertraline (ZOLOFT) 100 MG tablet Take 2 tablets (200 mg total) by mouth daily. 180 tablet 1   No current facility-administered medications for this visit.     Medication Side Effects: None  Allergies:  Allergies  Allergen Reactions  . Asa [Aspirin] Diarrhea    Nausea, hx ulcers  . Atorvastatin     Muscle aches  . Celebrex [Celecoxib]   . Codeine   . Penicillins   . Savella [Milnacipran Hcl]   . Viagra [Sildenafil Citrate] Other (See Comments)    Headache    Past Medical History:  Diagnosis Date  . BPH (benign prostatic hyperplasia)   . Cancer ([place])  nose, skin cancer  . Elevated hemoglobin A1c   . Fibromyalgia   . GERD (gastroesophageal reflux disease)   . Hyperlipidemia   . Hypertension   . Hypogonadism male   . IBS (irritable bowel syndrome)   . OSA (obstructive sleep apnea)     Family History  Problem Relation Age of Onset  . Cancer Mother        breast  . Cancer Father        lung  . Diabetes Father   . Heart disease Sister   . Arthritis Sister     Social History   Socioeconomic History  . Marital status: Legally Separated    Spouse name: Kennyth Lose  . Number of children: Not on file  . Years of  education: Not on file  . Highest education level: Not on file  Occupational History  . 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 Financial resource strain: Not on file  . Food insecurity:    Worry: Not on file    Inability: Not on file  . Transportation needs:    Medical: Not on file    Non-medical: Not on file  Tobacco Use  . Smoking status: Former Smoker    Years: 30.00    Types: Cigarettes    Last attempt to quit: 10/06/1980    Years since quitting: 38.2  . Smokeless tobacco: Never Used  Substance and Sexual Activity  . Alcohol use: No    Frequency: Never    Comment: Not drinking x 1 month  . Drug use: No  . Sexual activity: Not Currently  Lifestyle  . Physical activity:    Days per week: Not on file    Minutes per session: Not on file  . Stress: Not on file  Relationships  . Social connections:    Talks on phone: Not on file    Gets together: Not on file    Attends religious service: Not on file    Active member of club or organization: Not on file    Attends meetings of clubs or organizations: Not on file    Relationship status: Not on file  . Intimate partner violence:    Fear of current or ex partner: Not on file    Emotionally abused: Not on file    Physically abused: Not on file    Forced sexual activity: Not on file  Other Topics Concern  . Not on file  Social History Narrative  . Not on file    Past Medical History, Surgical history, Social history, and Family history were reviewed and updated as appropriate.   Please see review of systems for further details on the patient's review from today.   Objective:   Physical Exam:  There were no vitals taken for this visit.  Physical Exam Constitutional:      General: He is not in acute distress.    Appearance: He is well-developed.  Musculoskeletal:        General: No deformity.  Neurological:     Mental Status: He is alert and oriented to person, place, and time.     Motor: No tremor.      Coordination: Coordination normal.     Gait: Gait normal.  Psychiatric:        Attention and Perception: He is attentive. He does not perceive auditory hallucinations.        Mood and Affect: Mood is not anxious or depressed. Affect is not labile, blunt, angry or inappropriate.  Speech: Speech normal.        Behavior: Behavior normal.        Thought Content: Thought content normal. Thought content does not include homicidal or suicidal ideation. Thought content does not include homicidal or suicidal plan.        Cognition and Memory: Cognition normal.        Judgment: Judgment normal.     Comments: Insight intact. No auditory or visual hallucinations.  Overall less irritable and angry with people. Less blunted, more humor.     Lab Review:     Component Value Date/Time   NA 140 12/17/2018 0926   K 3.8 12/17/2018 0926   CL 104 12/17/2018 0926   CO2 30 12/17/2018 0926   GLUCOSE 207 (H) 12/17/2018 0926   BUN 9 12/17/2018 0926   CREATININE 1.42 (H) 12/17/2018 0926   CALCIUM 9.6 12/17/2018 0926   PROT 6.9 12/17/2018 0926   ALBUMIN 4.2 04/24/2017 1153   AST 41 (H) 12/17/2018 0926   ALT 25 12/17/2018 0926   ALKPHOS 49 04/24/2017 1153   BILITOT 1.1 12/17/2018 0926   GFRNONAA 50 (L) 12/17/2018 0926   GFRAA 58 (L) 12/17/2018 0926       Component Value Date/Time   WBC 4.6 12/17/2018 0926   RBC 4.90 12/17/2018 0926   HGB 14.6 12/17/2018 0926   HCT 44.1 12/17/2018 0926   PLT 156 12/17/2018 0926   MCV 90.0 12/17/2018 0926   MCH 29.8 12/17/2018 0926   MCHC 33.1 12/17/2018 0926   RDW 13.2 12/17/2018 0926   LYMPHSABS 1,578 12/17/2018 0926   MONOABS 670 04/24/2017 1153   EOSABS 120 12/17/2018 0926   BASOSABS 28 12/17/2018 0926    No results found for: POCLITH, LITHIUM   No results found for: PHENYTOIN, PHENOBARB, VALPROATE, CBMZ   .res Assessment: Plan:    Major depressive disorder, recurrent episode, moderate (HCC)  Generalized anxiety disorder  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 We discussed his chronic depression and anxiety and irritability, anger.  He's markedly better but has residual symptoms.  Benefit from meds and therapy. No change in therapy indicated.  Needs to continue the meds bc of a high relapse risk.  Supportive therapy re: dealing with stress of separation. Doesn't look like things will change.  Option potentiate with something but defer.  We discussed the short-term risks associated with benzodiazepines including sedation and increased fall risk among others.  Discussed long-term side effect risk including dependence, potential withdrawal symptoms, and the potential eventual dose-related risk of dementia.  No evidence of abuse.  Disc sobriety maintenance.    This appointment was 15 minutes  FU 5  mos.  Lynder Parents, MD, DFAPA   Please see After Visit Summary for patient specific instructions.  Future Appointments  Date Time Provider [place]  12/28/2018  8:00 AM Shanon Ace, LCSW CP-CP None  01/03/2019  9:00 AM Shanon Ace, LCSW CP-CP None  01/11/2019  8:00 AM Shanon Ace, LCSW CP-CP None  01/17/2019  9:00 AM Shanon Ace, LCSW CP-CP None  01/25/2019  8:00 AM Shanon Ace, LCSW CP-CP None  01/31/2019  9:00 AM Shanon Ace, LCSW CP-CP None  03/25/2019 10:30 AM Liane Comber, NP [facility] None  05/10/2019  8:30 AM Ward Givens, NP [facility] None  06/27/2019 10:30 AM [name], MD [facility] None  09/27/2019  9:00 AM  Liane Comber, NP [facility] None  01/03/2020  9:00 AM [name], MD [facility] None    No orders of the defined types were placed in this encounter.     -------------------------------

## 2018-12-28 ENCOUNTER — Ambulatory Visit: Payer: 59 | Admitting: Psychiatry

## 2019-01-03 ENCOUNTER — Other Ambulatory Visit: Payer: Self-pay

## 2019-01-03 ENCOUNTER — Ambulatory Visit (INDEPENDENT_AMBULATORY_CARE_PROVIDER_SITE_OTHER): Payer: Medicare Other | Admitting: Psychiatry

## 2019-01-03 DIAGNOSIS — F331 Major depressive disorder, recurrent, moderate: Secondary | ICD-10-CM

## 2019-01-03 NOTE — Progress Notes (Signed)
[facility] Counselor/Therapist Progress Note  Patient ID: Cody Hines., MRN: [id number],    Date: 01/03/2019  Time Spent:  40  Minutes    8:58a.m. to 9:58am  Treatment Type: Individual Therapy   Virtual Visit via Telephone Note I connected with patient by a video enabled telemedicine application or telephone, with their informed consent, and verified patient privacy and that I am speaking with the correct person using two identifiers. I am at [place] and patient is at his home.   I discussed the limitations, risks, security and privacy concerns of performing psychotherapy and management service by telephone and the availability of in person appointments. I also discussed with the patient that there may be a patient responsible charge related to this service. The patient expressed understanding and agreed to proceed.  I discussed the treatment planning with the patient. The patient was provided an opportunity to ask questions and all were answered. The patient agreed with the plan and demonstrated an understanding of the instructions.   The patient was advised to call  our office if  symptoms worsen or feel they are in a crisis state and need immediate contact.   Reported Symptoms:  Anxiety, depression   Mental Status Exam:  Appearance:    N/A  Behavior:  Sharing  Motor:  Normal  Speech/Language:   Normal Rate  Affect:  N/A  Mood:  some depression; Anxiety   Thought process:  normal  Thought content:    WNL  Sensory/Perceptual disturbances:    WNL  Orientation:  oriented to person, place, time/date, situation, day of week, month of year and year  Attention:  Good  Concentration:  Good  Memory:  WNL  Fund of knowledge:   Good  Insight:    Fair  Judgment:   Good  Impulse Control:  Good   Risk Assessment: Danger to Self:  No Self-injurious Behavior: No Danger to Others: No Duty to Warn:no Physical Aggression / Violence:No  Access to Firearms a  concern: No  Gang Involvement:No   Subjective:  Patient today reports his anxiety is some worse but depression is some better but seems to fluctuate some due to current pandemic issues.  Trying to check on grandkids and other relatives who are struggling.  Focused today more on frustration tolerance and discerning what he can change and the things he cannot change, and also try to not personalize things that are said from family, especially his separated wife.  Also quite difficult for him to let go and not hold onto any comments that he interprets as negative and not "keep score" in regards to interactions with separated wife. Talked in detail about this issue and the talking seemed productive.   He and separated wife have not divorced yet but patient does feel that will eventually.  Patient showing some brighter affect at times but still has some rigidity in opinions on certain issues. Worked with him more today on making the choice to have a more positive outlook that ends up positively affecting his feelings and behavior.  Understandably difficult to do this with all the coronavirus issues going on.  Discussed his main concerns with the virus situation and offered support.  Encouraged him to follow all the virus precautions, stay on his meds as prescribed, try to get some physical exercise, stay in touch with adult kids and friends. And to make sure he has things he'll need in case he can't get out for a few days  due to possibly more stay-at-home limitations.    Interventions:  Cognitive Behavioral / Solution-focused/Supportive  Diagnosis:   ICD-10-CM   1. Major depressive disorder, recurrent episode, moderate ([place]) F33.1     Plan:  Patient will continue work on CBT strategies as well as improved self care (healthier eating, trying to have more connections to people).  Continue to work on staying in the present and practice looking for things to go well rather than not going well.  Also to  consider ways that he can be more connected with other people during this pandemic time, especially with use of technology.  Next appt in 1-2 weeks to continue goal-directed treatment.   Shanon Ace, LCSW

## 2019-01-11 ENCOUNTER — Ambulatory Visit (INDEPENDENT_AMBULATORY_CARE_PROVIDER_SITE_OTHER): Payer: Medicare Other | Admitting: Psychiatry

## 2019-01-11 ENCOUNTER — Other Ambulatory Visit: Payer: Self-pay

## 2019-01-11 DIAGNOSIS — F331 Major depressive disorder, recurrent, moderate: Secondary | ICD-10-CM

## 2019-01-11 NOTE — Progress Notes (Signed)
[facility] Counselor/Therapist Progress Note  Patient ID: Cody Hines., MRN: [id number],    Date: 01/11/2019  Time Spent:  59  Minutes    7:55a.m. to 8:55am  Treatment Type: Individual Therapy   Virtual Visit via  Video / Telephone Note:  Tried to make video connection but patient not able. He lives alone in his trailer and does not have family nor neighbors to help him.  He is not very technologically literate and tried but just was not able to get video on his end. I connected with patient by a video enabled telemedicine application or telephone, with their informed consent, and verified patient privacy and that I am speaking with the correct person using two identifiers. I am at [place] and patient is at his home.   I discussed the limitations, risks, security and privacy concerns of performing psychotherapy and management service by telephone and the availability of in person appointments. I also discussed with the patient that there may be a patient responsible charge related to this service. The patient expressed understanding and agreed to proceed.  I discussed the treatment planning with the patient. The patient was provided an opportunity to ask questions and all were answered. The patient agreed with the plan and demonstrated an understanding of the instructions.   The patient was advised to call  our office if symptoms worsen or feel they are in a crisis state and need immediate contact.   Reported Symptoms:  Anxiety, depression   Mental Status Exam:  Appearance:    N/A  Behavior:  Sharing  Motor:  Normal  Speech/Language:   Normal Rate  Affect:  N/A  Mood:  some depression; Anxiety   Thought process:  normal  Thought content:    WNL  Sensory/Perceptual disturbances:    WNL  Orientation:  oriented to person, place, time/date, situation, day of week, month of year and year  Attention:  Good  Concentration:  Good  Memory:  WNL  Fund of  knowledge:   Good  Insight:    Fair  Judgment:   Good  Impulse Control:  Good   Risk Assessment: Danger to Self:  No Self-injurious Behavior: No Danger to Others: No Duty to Warn:no Physical Aggression / Violence:No  Access to Firearms a concern: No  Gang Involvement:No   Subjective:  Patient today reports his anxiety continues to be at a higher level and depression tends to be more episodic with "some good days or nights and some bad".  He feels that some of this is due to current pandemic issues and all the uncertainties involved. Trying to check on grandkids and other relatives who are struggling with some anxiety and the social isolation.  Focused today more on frustration tolerance and discerning what he can change and the things he cannot change, and also try to not personalize things that are said from family, especially his separated wife.  Patient was frustrated with a letter that separated wife had sent him that he interpreted as being critical of him and that may have been the intent, not sure.  Talked about how we can't control other people and things that are communicated, But we can control how we respond or react. Continued work on "Letting Go" of past hurts in that relationship.   Patient showing some resilience at times. Still has some rigidity in opinions on certain issues. Worked with him more today on making the choice to have a more positive outlook that  ends up positively affecting his feelings and behavior.   Discussed his main concerns with the virus situation and offered support.  Encouraged him to follow all the virus precautions, stay on his meds as prescribed, try to get some physical exercise, stay in touch with adult kids and friends.    Interventions:  Cognitive Behavioral / Solution-focused/Supportive  Diagnosis:   ICD-10-CM   1. Major depressive disorder, recurrent episode, moderate ([place]) F33.1     Plan:  Patient will continue work on CBT strategies as  well as improved self care (healthier eating, trying to have more connections to people).  Continue to work on staying in the present (versus dwelling on past) and practice looking for things to go well rather than not going well.  Also to consider ways that he can be more connected with other people during this pandemic time, especially with use of technology.  Next appt in 1-2 weeks to continue goal-directed treatment.   Shanon Ace, LCSW

## 2019-01-17 ENCOUNTER — Other Ambulatory Visit: Payer: Self-pay

## 2019-01-17 ENCOUNTER — Ambulatory Visit: Payer: Medicare Other | Admitting: Psychiatry

## 2019-01-25 ENCOUNTER — Other Ambulatory Visit: Payer: Self-pay

## 2019-01-25 ENCOUNTER — Ambulatory Visit (INDEPENDENT_AMBULATORY_CARE_PROVIDER_SITE_OTHER): Payer: Medicare Other | Admitting: Psychiatry

## 2019-01-25 DIAGNOSIS — F331 Major depressive disorder, recurrent, moderate: Secondary | ICD-10-CM

## 2019-01-25 NOTE — Progress Notes (Signed)
[facility] Counselor/Therapist Progress Note  Patient ID: Cody Cedillos., MRN: [id number],    Date: 01/25/2019  Time Spent:  43  Minutes    8:00a.m. to 9:00am  Treatment Type: Individual Therapy   Virtual Visit via  Video  I connected with patient by a video enabled telemedicine application or telephone, with their informed consent, and verified patient privacy and that I am speaking with the correct person using two identifiers. I am at [place] and patient is at his home.   I discussed the limitations, risks, security and privacy concerns of performing psychotherapy and management service by telephone and the availability of in person appointments. I also discussed with the patient that there may be a patient responsible charge related to this service. The patient expressed understanding and agreed to proceed.  I discussed the treatment planning with the patient. The patient was provided an opportunity to ask questions and all were answered. The patient agreed with the plan and demonstrated an understanding of the instructions.   The patient was advised to call  our office if symptoms worsen or feel they are in a crisis state and need immediate contact.   Reported Symptoms:  Anxiety, depression   Mental Status Exam:  Appearance:    casual  Behavior:  Sharing  Motor:  Normal  Speech/Language:   Normal Rate  Affect:  Anxious  Mood:  some depression; Anxiety   Thought process:  normal  Thought content:    WNL  Sensory/Perceptual disturbances:    WNL  Orientation:  oriented to person, place, time/date, situation, day of week, month of year and year  Attention:  Good  Concentration:  Good  Memory:  WNL  Fund of knowledge:   Good  Insight:    Fair  Judgment:   Good  Impulse Control:  Good   Risk Assessment: Danger to Self:  No Self-injurious Behavior: No Danger to Others: No Duty to Warn:no Physical Aggression / Violence:No  Access to Firearms a  concern: No  Gang Involvement:No   Subjective:  Patient today reports his anxiety continues to be at a higher level and depression has decreased some.  He feels that some of this is due to current pandemic issues with the uncertainties involved and also due to relationships issues he is still working to resolve.  Still discerning what he can change and the things he cannot change, and also try to not personalize things that are said from family, especially his separated wife. Continued work on letting go of past hurts in that relationship in order to move forward. Separated wife has mentioned more recently about them "possibly talking some things through".  Patient shared some of his thought on that and says he will take it step by step.   Patient talked almost non-stop today of how his past couple wks had been.  He lives alone and has spent a good bit of time alone so has not really talked much with others during this pandemic time of being quarantined at home.  Still has some rigidity in opinions on certain issues especially things related to former marriage.  Noticeably not as negative today.  Discussed his main concerns with the virus situation and was offered support and suggestions for getting through this time especially while living alone.  Encouraged him to follow all the virus precautions, stay on his meds as prescribed, try to get some physical exercise, stay in touch with adult kids and friends.  Interventions:  Cognitive Behavioral / Solution-focused/Supportive  Diagnosis:   ICD-10-CM   1. Major depressive disorder, recurrent episode, moderate ([place]) F33.1     Plan:  Patient will continue work on CBT strategies as well as improved self care (healthier eating, trying to have more connections to people).  Continue to work on staying in the present (versus dwelling on past) and practice looking for things to go well rather than not going well.  Also to consider ways that he can be more  connected with other people during this pandemic time, especially with use of technology.  Next appt in 1-2 weeks to continue goal-directed treatment.   Shanon Ace, LCSW

## 2019-01-31 ENCOUNTER — Ambulatory Visit (INDEPENDENT_AMBULATORY_CARE_PROVIDER_SITE_OTHER): Payer: Medicare Other | Admitting: Psychiatry

## 2019-01-31 ENCOUNTER — Other Ambulatory Visit: Payer: Self-pay

## 2019-01-31 DIAGNOSIS — F331 Major depressive disorder, recurrent, moderate: Secondary | ICD-10-CM

## 2019-01-31 NOTE — Progress Notes (Addendum)
[facility] Counselor/Therapist Progress Note  Patient ID: Cody Genther., MRN: [id number],    Date: 01/31/2019  Time Spent:  73  Minutes    9a.m. to 10:00am  Treatment Type: Individual Therapy   Virtual Visit with Video  I connected with patient by a video enabled telemedicine application or telephone, with their informed consent, and verified patient privacy and that I am speaking with the correct person using two identifiers. I am at [place] and patient is at his home.   I discussed the limitations, risks, security and privacy concerns of performing psychotherapy and management service by telephone and the availability of in person appointments. I also discussed with the patient that there may be a patient responsible charge related to this service. The patient expressed understanding and agreed to proceed.  I discussed the treatment planning with the patient. The patient was provided an opportunity to ask questions and all were answered. The patient agreed with the plan and demonstrated an understanding of the instructions.   The patient was advised to call  our office if symptoms worsen or feel they are in a crisis state and need immediate contact.   Reported Symptoms:  Anxiety, depression   Mental Status Exam:  Appearance:    casual  Behavior:  Sharing  Motor:  Normal  Speech/Language:   Normal Rate  Affect:  Anxious  Mood:  some depression; Anxiety   Thought process:  normal  Thought content:    WNL  Sensory/Perceptual disturbances:    WNL  Orientation:  oriented to person, place, time/date, situation, day of week, month of year and year  Attention:  Good  Concentration:  Good  Memory:  WNL  Fund of knowledge:   Good  Insight:    Fair  Judgment:   Good  Impulse Control:  Good   Risk Assessment: Danger to Self:  No Self-injurious Behavior: No Danger to Others: No Duty to Warn:no Physical Aggression / Violence:No  Access to Firearms a  concern: No  Gang Involvement:No   Subjective:  Patient reports anxiety and depression continue but he's noticed "a few positives" in addition to "negatives".  States separated wife mentioned they could talk and patient did meet with her 3 times this week on her front porch.  The first talk "went pretty good and we picked and chose our responses to each other."  "Yesterday, the 3rd time they spoke it didn't go very well as she kept going back to the past and some focus on money."  Per patient, it sounded like some of the verbal exchange was not helpful to either person in conversation.  Looked at how he could have communicated differently to be better heard and to help conversation to go in a more helpful direction.  Actually practiced this some with patient and he agreed but difficult to remember "in the moment". Talked through some of his frustrations and this seemed to help.  Patient does have moments where he has more insight to his behavior and how he can impact others positively or negatively.  Also processed his feelings about a sister who lives nearby with multiple physical issues that seems to have worsened.  Patient still encouraged to work on not personalizing things that are said from family and others, especially his separated wife. Continued work on letting go of past hurts in that relationship in order to move forward.  Patient states that he's not sure when separated wife and he may talk again  and adds he will take it step by step.  Interventions:  Cognitive Behavioral / Solution-focused/Supportive  Diagnosis:   ICD-10-CM   1. Major depressive disorder, recurrent episode, moderate ([place]) F33.1     Plan:  Patient will continue work on CBT strategies as well as improved self care (healthier eating, trying to have more connections to people).  Continue to work on staying in the present (versus dwelling on past) and practice looking for things to go well rather than not going well.  Also  to consider ways that he can be more connected with other people during this pandemic time, especially with use of technology.  Next appt in 1 week to continue goal-directed treatment.   Shanon Ace, LCSW

## 2019-02-08 ENCOUNTER — Other Ambulatory Visit: Payer: Self-pay

## 2019-02-08 ENCOUNTER — Ambulatory Visit (INDEPENDENT_AMBULATORY_CARE_PROVIDER_SITE_OTHER): Payer: Medicare Other | Admitting: Psychiatry

## 2019-02-08 DIAGNOSIS — F331 Major depressive disorder, recurrent, moderate: Secondary | ICD-10-CM | POA: Diagnosis not present

## 2019-02-08 NOTE — Progress Notes (Signed)
[facility] Counselor/Therapist Progress Note  Patient ID: Rafay Dahan., MRN: [id number],    Date: 02/08/2019  Time Spent:  64  Minutes    10:00a.m. to 11:00am  Treatment Type: Individual Therapy   Virtual Visit Note:  I connected with patient by a video enabled telemedicine application or telephone, with their informed consent, and verified patient privacy and that I am speaking with the correct person using two identifiers. I am at [place] and patient is at his home.   I discussed the limitations, risks, security and privacy concerns of performing psychotherapy and management service by telephone and the availability of in person appointments. I also discussed with the patient that there may be a patient responsible charge related to this service. The patient expressed understanding and agreed to proceed.  I discussed the treatment planning with the patient. The patient was provided an opportunity to ask questions and all were answered. The patient agreed with the plan and demonstrated an understanding of the instructions.   The patient was advised to call  our office if symptoms worsen or feel they are in a crisis state and need immediate contact.   Reported Symptoms:  Anxiety, depression, some negativity, irritability  Mental Status Exam:  Appearance:   N/A  (telehealth)  Behavior:  Sharing  Motor:  Normal  Speech/Language:   Normal Rate  Affect:  N/A  (telehealth)  Mood:  some depression; Anxiety   Thought process:  normal  Thought content:    WNL  Sensory/Perceptual disturbances:    WNL  Orientation:  oriented to person, place, time/date, situation, day of week, month of year and year  Attention:  Good  Concentration:  Good  Memory:  WNL  Fund of knowledge:   Good  Insight:    Fair  Judgment:   Good  Impulse Control:  Good   Risk Assessment: Danger to Self:  No Self-injurious Behavior: No Danger to Others: No Duty to Warn:no Physical  Aggression / Violence:No  Access to Firearms a concern: No  Gang Involvement:No   Subjective:  Patient reports continued anxiety, irritability, and depression, some of which seems related to his ongoing communication with separated wife. Had some conversations recently that did not go well and they both ended up angry, resentful, blaming, and frustrated. Patient admitted "when she kept pushing me in the conversation, I got loud!"  Did not get closer to her but I did get loud and then she got mad and kept it going.  Looked at how that was destructive.  After those interactions, patient has been re-hashing the past and trying to talk with wife on things they repeatedly have fought about and blamed each other.  Worked with patient to help him calm down as he did get more agitated talking about things separated wife was accusing him of that was not true.  We discussed the need for patient to work again on more letting go of past anger, hurts, disappointments.  This is hard for patient but he has had some success in the past but will likely mean not exposing himself to the negativity.  He agree to work on this as he has noticed his most recent decline in his self-talk and his overall outlook.  Patient still encouraged to work on not personalizing things that are said from family and others, especially his separated wife. Continued work on letting go of past hurts in that relationship in order for him to move forward.  Interventions:  Cognitive Behavioral / Solution-focused/Supportive  Diagnosis:   ICD-10-CM   1. Major depressive disorder, recurrent episode, moderate ([place]) F33.1     Plan:  Patient will continue work on CBT strategies as well as improved self care (healthier eating, trying to have more connections to people).  Continue to work on staying in the present (versus dwelling on past) and practice looking for things to go well rather than not going well.  Also to consider ways that he can be  more connected with other people during this pandemic time, especially with use of technology.  Next appt in 1 week to continue goal-directed treatment.   Shanon Ace, LCSW

## 2019-02-14 ENCOUNTER — Other Ambulatory Visit: Payer: Self-pay

## 2019-02-14 ENCOUNTER — Ambulatory Visit (INDEPENDENT_AMBULATORY_CARE_PROVIDER_SITE_OTHER): Payer: Medicare Other | Admitting: Psychiatry

## 2019-02-14 DIAGNOSIS — F331 Major depressive disorder, recurrent, moderate: Secondary | ICD-10-CM | POA: Diagnosis not present

## 2019-02-14 NOTE — Progress Notes (Addendum)
[facility] Counselor/Therapist Progress Note  Patient ID: Cody Hines., MRN: [id number],    Date: 02/14/2019  Time Spent:  15  Minutes    8:00a.m. to 9:00am  Treatment Type: Individual Therapy   Virtual Visit Note:  I connected with patient by a video enabled telemedicine application or telephone, with their informed consent, and verified patient privacy and that I am speaking with the correct person using two identifiers. I am at [place] and patient is at his home.   I discussed the limitations, risks, security and privacy concerns of performing psychotherapy and management service by telephone and the availability of in person appointments. I also discussed with the patient that there may be a patient responsible charge related to this service. The patient expressed understanding and agreed to proceed.  I discussed the treatment planning with the patient. The patient was provided an opportunity to ask questions and all were answered. The patient agreed with the plan and demonstrated an understanding of the instructions.   The patient was advised to call  our office if symptoms worsen or feel they are in a crisis state and need immediate contact.   Reported Symptoms:  Anxiety, depression  Any Health or Medication changes since last appt:  Patient reports none.  Mental Status Exam:  Appearance:   N/A  (telehealth)  Behavior:  Sharing  Motor:  Normal  Speech/Language:   Normal Rate  Affect:  N/A  (telehealth)  Mood:  some depression; Anxiety   Thought process:  normal  Thought content:    WNL  Sensory/Perceptual disturbances:    WNL  Orientation:  oriented to person, place, time/date, situation, day of week, month of year and year  Attention:  Good  Concentration:  Good  Memory:  WNL  Fund of knowledge:   Good  Insight:    Fair  Judgment:   Good  Impulse Control:  Good   Risk Assessment: Danger to Self:  No Self-injurious Behavior: No Danger  to Others: No Duty to Warn:no Physical Aggression / Violence:No  Access to Firearms a concern: No  Gang Involvement:No   Subjective:  Patient reports some continued anxiety and depression, a lot of which seems related to his ongoing communication with separated wife.  He was noticeably less negative and irritable today.  He was invited to participate in family gathering for Mother's Day held at home of his separated wife and he reports that "it went pretty well".  Shared what felt good to him in being with family. I acknowledged his being more positive today and we discussed how he can hold on to that sense of positivity and moving forward versus letting an eventual bad day or difficult interaction with someone, trip him up.  Focused on developing habit of intentionally looking for things to go well versus looking for them to go wrong, and also how to not engage with someone when they are being negative highly critical, while practicing "Letting Go" of things that pull him down, and focus more on not "re-hashing", but rather moving forward without the weight of old hurts/resentment/regrets.  Reviewed strategies to help patient hold onto more positives and work on the letting go of past hurts, and also improve his self-talk. Goal review (per Flowsheets) and progress noted with patient.  Interventions:  Cognitive Behavioral / Solution-focused/Supportive  Diagnosis:   ICD-10-CM   1. Major depressive disorder, recurrent episode, moderate ([place]) F33.1     Plan:  Patient will continue work  on CBT strategies as well as improved self care (healthier eating, trying to have more connections to people).  Continue to work on staying in the present (versus dwelling on past) and practice looking for things to go well rather than not going well.  Also to consider ways that he can be more connected with other people during this pandemic time, especially with use of technology.  Next appt in 1 week to continue  goal-directed treatment.   Shanon Ace, LCSW

## 2019-02-21 ENCOUNTER — Ambulatory Visit (INDEPENDENT_AMBULATORY_CARE_PROVIDER_SITE_OTHER): Payer: Medicare Other | Admitting: Psychiatry

## 2019-02-21 ENCOUNTER — Other Ambulatory Visit: Payer: Self-pay

## 2019-02-21 DIAGNOSIS — F331 Major depressive disorder, recurrent, moderate: Secondary | ICD-10-CM

## 2019-02-21 NOTE — Progress Notes (Signed)
[facility] Counselor/Therapist Progress Note  Patient ID: Cody Hines., MRN: [id number],    Date: 02/21/2019  Time Spent:  64  Minutes    7:50a.m. to 8:50am  Treatment Type: Individual Therapy   Virtual Visit Note:  I connected with patient by a video enabled telemedicine application or telephone, with their informed consent, and verified patient privacy and that I am speaking with the correct person using two identifiers. I am at [place] and patient is at his home.   I discussed the limitations, risks, security and privacy concerns of performing psychotherapy and management service by telephone and the availability of in person appointments. I also discussed with the patient that there may be a patient responsible charge related to this service. The patient expressed understanding and agreed to proceed.  I discussed the treatment planning with the patient. The patient was provided an opportunity to ask questions and all were answered. The patient agreed with the plan and demonstrated an understanding of the instructions.   The patient was advised to call  our office if symptoms worsen or feel they are in a crisis state and need immediate contact.   Reported Symptoms:  Anxiety, depression,   Any Health or Medication changes since last appt:  Patient reports none.  Mental Status Exam:  Appearance:   N/A  (telehealth)  Behavior:  Sharing  Motor:  Normal  Speech/Language:   Normal Rate  Affect:  N/A  (telehealth)  Mood:  some depression; Anxiety   Thought process:  normal  Thought content:    WNL  Sensory/Perceptual disturbances:    WNL  Orientation:  oriented to person, place, time/date, situation, day of week, month of year and year  Attention:  Good  Concentration:  Good  Memory:  WNL  Fund of knowledge:   Good  Insight:    Fair  Judgment:   Good  Impulse Control:  Good   Risk Assessment: Danger to Self:  No Self-injurious Behavior:  No Danger to Others: No Duty to Warn:no Physical Aggression / Violence:No  Access to Firearms a concern: No  Gang Involvement:No   Subjective:  Patient reports "a little bit of improvement" in his anxiety and depression. He also shared that he and separated wife have been talking more frequently, usually on the front porch when he goes to care for his dog who is still living in the lot at separated wife's home. Overall, presents as less negative and irritable today. From what patient describes, he is beginning to practice some of the communication skills we've worked on frequently: active listening, asking for clarification when needed and not assuming, choosing when to not respond, and paying attention to how he says what he says. This is recent behavior for patient. He is more positive today and we discussed again how he can hold on to that sense of positivity versus letting an eventual bad day or difficult interaction with someone, trip him up. Has noticed his sleep hasn't been as good past few days, difficulty falling asleep and staying asleep.  Trying not to take daytime naps and working on "not worrying when he goes to bed".  Will check in next session on this again.  *(review each session)  Reviewed the strategy of intentionally looking for things to go well versus looking for them to go wrong, and also how to not engage with someone when they are being negative highly critical, while practicing "Letting Go" of things that pull him down,  and focus more on not repeatedly "re-hashing", but rather moving forward without the weight of old hurts/resentment/regrets.  Reviewed strategies to help patient hold onto more positives and work on the letting go of past hurts, and also improve his self-talk. Goal review (per Flowsheets) and progress noted with patient.  Interventions:  Cognitive Behavioral / Solution-focused/Supportive  Diagnosis:   ICD-10-CM   1. Major depressive disorder, recurrent  episode, moderate ([place]) F33.1     Plan:  Patient will continue work on CBT strategies as well as improved self care (healthier eating, trying to have more connections to people).  Continue to work on staying in the present (versus dwelling on past) and practice looking for things to go well rather than not going well.  Also to consider ways that he can be more connected with other people during this pandemic time, especially with use of technology.  Next appt in 1 week to continue goal-directed treatment.   Shanon Ace, LCSW

## 2019-02-25 ENCOUNTER — Other Ambulatory Visit: Payer: Self-pay | Admitting: Adult Health

## 2019-03-01 ENCOUNTER — Ambulatory Visit (INDEPENDENT_AMBULATORY_CARE_PROVIDER_SITE_OTHER): Payer: Medicare Other | Admitting: Psychiatry

## 2019-03-01 ENCOUNTER — Other Ambulatory Visit: Payer: Self-pay

## 2019-03-01 DIAGNOSIS — F331 Major depressive disorder, recurrent, moderate: Secondary | ICD-10-CM

## 2019-03-01 NOTE — Progress Notes (Signed)
[facility] Counselor/Therapist Progress Note  Patient ID: Cody Hines., MRN: [id number],    Date: 03/01/2019  Time Spent:  15  Minutes     9:00am to 10:00am  Treatment Type: Individual Therapy   Virtual Visit Note:  I connected with patient by a video enabled telemedicine application or telephone, with their informed consent, and verified patient privacy and that I am speaking with the correct person using two identifiers. I am at [place] and patient is at his home.   I discussed the limitations, risks, security and privacy concerns of performing psychotherapy and management service by telephone and the availability of in person appointments. I also discussed with the patient that there may be a patient responsible charge related to this service. The patient expressed understanding and agreed to proceed.  I discussed the treatment planning with the patient. The patient was provided an opportunity to ask questions and all were answered. The patient agreed with the plan and demonstrated an understanding of the instructions.   The patient was advised to call  our office if symptoms worsen or feel they are in a crisis state and need immediate contact.   Reported Symptoms:  Anxiety, depression  Any Health or Medication changes since last appt:  Patient reports none.  Mental Status Exam:  Appearance:   N/A  (telehealth)  Behavior:  Sharing  Motor:  Normal  Speech/Language:   Normal Rate  Affect:  N/A  (telehealth)  Mood:  some depression; Anxiety   Thought process:  normal  Thought content:    WNL  Sensory/Perceptual disturbances:    WNL  Orientation:  oriented to person, place, time/date, situation, day of week, month of year and year  Attention:  Good  Concentration:  Good  Memory:  WNL  Fund of knowledge:   Good  Insight:    Fair  Judgment:   Good  Impulse Control:  Good   Risk Assessment: Danger to Self:  No Self-injurious Behavior: No Danger  to Others: No Duty to Warn:no Physical Aggression / Violence:No  Access to Firearms a concern: No  Gang Involvement:No   Subjective:  Patient reports some continuing anxiety and depression and shares recent conversation with separated wife about some things in the past that did not go well.  Hard for him to untangle from the past on some occasions.  Has done better at times but easy to fall back into some old habits of focusing on past versus present. Patient discussed multiple situations recently where he was feeling more irritable and in each situation he was overly concentrating on past events and conversations.  Hard for him to refrain from that especially when it involves family or their views.  Discussed how he has made progress previously in focusing less on negatives in the past. Looked at how he can be aware when things hook him and be able to let-go quicker and choose to respond in healthier ways. Gave examples of positive and negative and patient able to give a good example himself. Still having contact with separated wife.  Some times go ok and some do not, as patient is wanting to keep some contact at this point and having to work at choosing what to respond to and when to refrain from responding. Encouraged overall self-care and being in touch with extended family and friends during this time of less social contact.  *(review each session)  Reviewed the strategy of intentionally looking for things to go well  versus looking for them to go wrong, and also how to not engage with someone when they are being negative highly critical, while practicing "Letting Go" of things that pull him down, and focus more on not repeatedly "re-hashing", but rather moving forward without the weight of old hurts/resentment/regrets.  Reviewed strategies to help patient hold onto more positives and work on the letting go of past hurts, and also improve his self-talk. Goal review (per Flowsheets) and progress noted  with patient.  Interventions:  Cognitive Behavioral / Solution-focused/Supportive  Diagnosis:   ICD-10-CM   1. Major depressive disorder, recurrent episode, moderate ([place]) F33.1     Plan:  Patient will continue work on CBT strategies as well as improved self care (healthier eating, trying to have more connections to people).  Continue to work on staying in the present (versus dwelling on past) and practice looking for things to go well rather than not going well.  Also to consider ways that he can be more connected with other people during this pandemic time, especially with use of technology.  Next appt in 1 week to continue goal-directed treatment.   Shanon Ace, LCSW

## 2019-03-08 ENCOUNTER — Other Ambulatory Visit: Payer: Self-pay

## 2019-03-08 ENCOUNTER — Ambulatory Visit (INDEPENDENT_AMBULATORY_CARE_PROVIDER_SITE_OTHER): Payer: Medicare Other | Admitting: Psychiatry

## 2019-03-08 DIAGNOSIS — F331 Major depressive disorder, recurrent, moderate: Secondary | ICD-10-CM | POA: Diagnosis not present

## 2019-03-08 NOTE — Progress Notes (Signed)
[facility] Counselor/Therapist Progress Note  Patient ID: Cody Pellegrin., MRN: [id number],    Date: 03/08/2019  Time Spent:  39  Minutes     11:00am to 12:00noon  Treatment Type: Individual Therapy   Reported Symptoms:  Anxiety, depression  Any Health or Medication changes since last appt:  Patient reports none.  Mental Status Exam:  Appearance:   casual  Behavior:  Sharing  Motor:  Normal  Speech/Language:   Normal Rate  Affect:  anxious  Mood:  some depression; Anxiety   Thought process:  normal  Thought content:    WNL  Sensory/Perceptual disturbances:    WNL  Orientation:  oriented to person, place, time/date, situation, day of week, month of year and year  Attention:  Good  Concentration:  Good  Memory:  WNL  Fund of knowledge:   Good  Insight:    Fair  Judgment:   Good  Impulse Control:  Good   Risk Assessment: Danger to Self:  No Self-injurious Behavior: No Danger to Others: No Duty to Warn:no Physical Aggression / Violence:No  Access to Firearms a concern: No  Gang Involvement:No   Subjective: Patient reports today he is having increased anxiety and depression. As he spoke several minutes, he began bringing up things that happened well over a year ago and he had reportedly "let go".  Per patient talk and behavior today, it has re-surfaced and he held onto it throughout a good part of the session. Very difficult for him to let go of things that either anger or hurt him. Has done better at times but easy to fall back into some old habits of focusing on past versus present. Patient discussed several situations recently where he was feeling more irritable and in each situation he was overly concentrating on past events and conversations. After venting freely, he eventually was able to stay more in the present.  Patient and I discussed how he can become more aware when he gets "hooked" by things in the past and be able to make the positive choice to let-go  with more intentionality and not drag things back up from past.  Also looked at how he can become successful at looking for what might go wrong versus right.  Despite the past couple weeks being worse due to he and wife getting caught up in past negatives, patient still plans to see her this week. Sometimes their talks go ok and sometimes they go in negative direction.  *(review each session)  Reviewed the strategy of intentionally looking for things to go well versus looking for them to go wrong, and also how to not engage with someone when they are being negative highly critical, while practicing "Letting Go" of things that pull him down, and focus more on not repeatedly "re-hashing", but rather moving forward without the weight of old hurts/resentment/regrets.  Reviewed strategies to help patient hold onto more positives and work on the letting go of past hurts, and also improve his self-talk. Goal review (per Flowsheets) and progress noted with patient.  Interventions:  Cognitive Behavioral / Solution-focused/Supportive  Diagnosis:   ICD-10-CM   1. Major depressive disorder, recurrent episode, moderate ([place]) F33.1     Plan:  Patient will continue work on CBT strategies as well as improved self care (healthier eating, trying to have more connections to people).  Continue to work on staying in the present (versus dwelling on past) and practice looking for things to go well rather than  not going well.  Also to consider ways that he can be more connected with other people during this pandemic time, especially with use of technology.  Next appt in 1 week to continue goal-directed treatment.   Shanon Ace, LCSW

## 2019-03-15 ENCOUNTER — Ambulatory Visit (INDEPENDENT_AMBULATORY_CARE_PROVIDER_SITE_OTHER): Payer: Medicare Other | Admitting: Psychiatry

## 2019-03-15 ENCOUNTER — Other Ambulatory Visit: Payer: Self-pay

## 2019-03-15 DIAGNOSIS — F331 Major depressive disorder, recurrent, moderate: Secondary | ICD-10-CM | POA: Diagnosis not present

## 2019-03-15 NOTE — Progress Notes (Signed)
[facility] Counselor/Therapist Progress Note  Patient ID: Cody Hines., MRN: [id number],    Date: 03/15/2019  Time Spent:  20  Minutes     8:00am to 9:00am  Treatment Type: Individual Therapy   Reported Symptoms:  Anxiety, depression, irritability  Any Health or Medication changes since last appt:  Patient reports none.  Mental Status Exam:  Appearance:   casual  Behavior:  Sharing  Motor:  Normal  Speech/Language:   Normal Rate  Affect:  anxious  Mood:  Irritability, depression; anxiety  Thought process:  normal  Thought content:    WNL  Sensory/Perceptual disturbances:    WNL  Orientation:  oriented to person, place, time/date, situation, day of week, month of year and year  Attention:  Good  Concentration:  Good  Memory:  WNL  Fund of knowledge:   Good  Insight:    Fair  Judgment:   Good  Impulse Control:  Good   Risk Assessment: Danger to Self:  No Self-injurious Behavior: No Danger to Others: No Duty to Warn:no Physical Aggression / Violence:No  Access to Firearms a concern: No  Gang Involvement:No   Subjective: Patient reports some increase in anxiety and depression.  No SI nor HI. Irritable and has had a couple recent interactions with separated wife that did not go well.  This seemed to set him back some as he presents more irritable and negative today, and having trouble getting out of the past and staying in the present.  Has made progress before however has long-standing feelings towards family member and finds it hard to complete the forgiveness needed to move forward with more peace.  Discussed this again today with patient.  Patient does reach a point of seeming to feel better after venting his anger, frustration, and resentment of another family member. Able to laugh appropriately and focus some more on a couple positives about his grandchildre.  Very difficult for him to let go of things that either anger or hurt him. Has done better at times  but easy to fall back into some old habits of focusing on past versus present. Patient discussed several situations recently where he was feeling more irritable and in each situation he was overly concentrating on past events and conversations. After venting freely, he eventually was able to stay more in the present.  Patient and I discussed how he can become more aware when he gets "hooked" by things in the past and be able to make the positive choice to let-go with more intentionality and not drag things back up from past.  Also looked at how he can become successful at looking for what might go wrong versus right, and will take consistent effort.   *(review each session)  Reviewed how to not engage with someone when they are being negative highly critical, while practicing "Letting Go" of things that pull him down, and focus more on not repeatedly "re-hashing", but rather moving forward without the weight of old hurts/resentment/regrets.  Reviewed strategies to help patient hold onto more positives and work on the letting go of past hurts, and also improve his self-talk. Goal review (per Flowsheets) and progress and need areas noted with patient.  Interventions:  Cognitive Behavioral / Solution-focused/Supportive  Diagnosis:   ICD-10-CM   1. Major depressive disorder, recurrent episode, moderate (HCC) F33.1     Plan: Goal review with patient and some progress noted.  Patient will continue work on CBT strategies as well as improved  self care (healthier eating, trying to have more connections to people).  Continue to work on staying in the present (versus dwelling on past) and practice looking for things to go well rather than not going well.  Also to consider ways that he can be more connected with other people during this pandemic time, especially with use of technology.  Next appt in 1 week to continue goal-directed treatment.   Shanon Ace, LCSW

## 2019-03-23 DIAGNOSIS — N183 Chronic kidney disease, stage 3 unspecified: Secondary | ICD-10-CM | POA: Insufficient documentation

## 2019-03-23 NOTE — Progress Notes (Signed)
Virtual Visit via Telephone Note  I connected with Cody Hines. on 03/25/19 at 10:30 AM EDT by telephone and verified that I am speaking with the correct person using two identifiers.  Location: Patient: home  Provider: [place] office   I discussed the limitations, risks, security and privacy concerns of performing an evaluation and management service by telephone and the availability of in person appointments. I also discussed with the patient that there may be a patient responsible charge related to this service. The patient expressed understanding and agreed to proceed.   I discussed the assessment and treatment plan with the patient. The patient was provided an opportunity to ask questions and all were answered. The patient agreed with the plan and demonstrated an understanding of the instructions.   The patient was advised to call back or seek an in-person evaluation if the symptoms worsen or if the condition fails to improve as anticipated.  I provided 22 minutes of non-face-to-face time during this encounter.   Izora Ribas, NP     FOLLOW UP  Assessment and Plan:   Hypertension Well controlled with current medications  Monitor blood pressure at home; patient to call if consistently greater than 130/80 Continue DASH diet.   Reminder to go to the ER if any CP, SOB, nausea, dizziness, severe HA, changes vision/speech, left arm numbness and tingling and jaw pain.  Cholesterol Currently at LDL goal; continue statin; diet discussed for trigs Continue low cholesterol diet and exercise.  Check lipid panel at next visit  Prediabetes Continue diet and exercise.  Perform daily foot/skin check, notify office of any concerning changes.  Check A1C next  Obesity with co morbidities Long discussion about weight loss, diet, and exercise Recommended diet heavy in fruits and veggies and low in animal meats, cheeses, and dairy products, appropriate calorie intake Discussed  ideal weight for height He is working on low carb diet with sister, advised to add daily exercise aiming for at least 15-20 of intentional activity Will follow up in 3 months    Vitamin D Def Approaching goal at last visit; he did not increase dose - advised to increase from 5000 IU to 10000 IU daily and he is agreeable for goal of 60-100 Defer Vit D level  Discussed labs; he declines any visits to office/lab visit; last labs reviewed with him; appears he has been stable, no medication changes last visit; reasonable to defer to next [place] and meds as discussed. Further disposition pending results of labs. Discussed med's effects and SE's.   Over 30 minutes of exam, counseling, chart review, and critical decision making was performed.   Future Appointments  Date Time Provider [place]  03/29/2019  8:00 AM Shanon Ace, LCSW CP-CP None  04/05/2019  8:00 AM Shanon Ace, LCSW CP[facility] None  04/12/2019  8:00 AM Shanon Ace, LCSW CP-CP None  04/19/2019  8:00 AM Shanon Ace, LCSW CP-CP None  04/26/2019  8:00 AM Shanon Ace, LCSW CP-CP None  05/03/2019  8:00 AM Shanon Ace, LCSW CP-CP None  05/09/2019  8:00 AM Shanon Ace, LCSW CP-CP None  05/10/2019  8:30 AM Ward Givens, NP [facility] None  05/16/2019  8:00 AM Shanon Ace, LCSW CP-CP None  05/23/2019  9:00 AM Cottle, Billey Co., MD CP-CP None  06/27/2019 10:30 AM [name], MD [facility] None  09/27/2019  9:00 AM Liane Comber, NP [facility] None  01/03/2020  9:00 AM [name], MD [facility] None     ----------------------------------------------------------------------------------------------------------------------  HPI 73 y.o. male  presents for 3 month follow up on hypertension, cholesterol, prediabetes, obesity and vitamin D deficiency.   he has a diagnosis of depression and is currently on zoloft 200 mg daily, reports symptoms are well controlled on current regimen. He is followed by [facility] Dr. Clovis Pu for management. he currently uses ativan variably, some weeks uses daily, but may go 1-2 weeks without using.   BMI is Body mass index is 28.23 kg/m., he has been working on diet and exercise (walks all day while at work, lots of yardwork). Sister was diagnosed with T2DM and he has been joining her with dietary choices. Wt Readings from Last 3 Encounters:  03/25/19 214 lb (97.1 kg)  12/17/18 219 lb (99.3 kg)  11/02/18 219 lb 9.6 oz (99.6 kg)   His blood pressure has been controlled at home, today their BP is BP: 130/76  He does not workout. He denies chest pain, shortness of breath, dizziness.   He is on cholesterol medication (rosuvastain 20 mg every other day) and denies myalgias. His LDL cholesterol is at goal. The cholesterol last visit was:   Lab Results  Component Value Date   CHOL 119 12/17/2018   HDL 52 12/17/2018   LDLCALC 51 12/17/2018   TRIG 77 12/17/2018   CHOLHDL 2.3 12/17/2018    He has been working on diet and exercise for prediabetes, and denies foot ulcerations, increased appetite, nausea, paresthesia of the feet, polydipsia, polyuria, visual disturbances, vomiting and weight loss. Last A1C in the office was:  Lab Results  Component Value Date   HGBA1C 6.1 (H) 12/17/2018   Patient is on Vitamin D supplement.   Lab Results  Component Value Date   VD25OH 21 12/17/2018     He has had intermittent mild LFT elevation; has hx of negative hepatitis panel; discussed [place], would consider in the future, defer for now due to covid 19 Lab Results  Component Value Date   ALT 25 12/17/2018   AST 41 (H) 12/17/2018   ALKPHOS 49 04/24/2017   BILITOT 1.1 12/17/2018     Current Medications:  Current Outpatient Medications on File Prior to Visit  Medication Sig  . aspirin EC 81 MG tablet Take 81 mg by mouth daily.  . enalapril (VASOTEC) 20 MG tablet TAKE 1 TABLET BY MOUTH  EVERY DAY FOR BLOOD  PRESSURE  . Flaxseed, Linseed, (FLAX SEED OIL) 1000 MG CAPS Take 2  capsules by mouth daily.   Marland Kitchen LORazepam (ATIVAN) 1 MG tablet Take 1 tablet (1 mg total) by mouth at bedtime. (Patient taking differently: Take 1 mg by mouth as needed. )  . Magnesium 500 MG TABS Take by mouth daily.  Marland Kitchen OVER THE COUNTER MEDICATION 5,000 Units. Vitamin D  . rosuvastatin (CRESTOR) 40 MG tablet TAKE 1 TABLET BY MOUTH  DAILY FOR CHOLESTEROL  . sertraline (ZOLOFT) 100 MG tablet Take 2 tablets (200 mg total) by mouth daily.   No current facility-administered medications on file prior to visit.      Allergies:  Allergies  Allergen Reactions  . Asa [Aspirin] Diarrhea    Nausea, hx ulcers  . Atorvastatin     Muscle aches  . Celebrex [Celecoxib]   . Codeine   . Penicillins   . Savella [Milnacipran Hcl]   . Viagra [Sildenafil Citrate] Other (See Comments)    Headache     Medical History:  Past Medical History:  Diagnosis Date  . BPH (benign prostatic hyperplasia)   .  Cancer (HCC)    nose, skin cancer  . Elevated hemoglobin A1c   . Fibromyalgia   . GERD (gastroesophageal reflux disease)   . Hyperlipidemia   . Hypertension   . Hypogonadism male   . IBS (irritable bowel syndrome)   . OSA (obstructive sleep apnea)    Family history- Reviewed and unchanged Social history- Reviewed and unchanged   Review of Systems:  Review of Systems  Constitutional: Negative for malaise/fatigue and weight loss.  HENT: Negative for hearing loss and tinnitus.   Eyes: Negative for blurred vision and double vision.  Respiratory: Negative for cough, shortness of breath and wheezing.   Cardiovascular: Negative for chest pain, palpitations, orthopnea, claudication and leg swelling.  Gastrointestinal: Negative for abdominal pain, blood in stool, constipation, diarrhea, heartburn, melena, nausea and vomiting.  Genitourinary: Negative.   Musculoskeletal: Negative for joint pain and myalgias.  Skin: Negative for rash.  Neurological: Negative for dizziness, tingling, sensory change,  weakness and headaches.  Endo/Heme/Allergies: Negative for polydipsia.  Psychiatric/Behavioral: Negative for depression, substance abuse and suicidal ideas. The patient is not nervous/anxious.   All other systems reviewed and are negative.   Physical Exam: BP 130/76   Temp 97.8 F (36.6 C)   Wt 214 lb (97.1 kg)   BMI 28.23 kg/m  Wt Readings from Last 3 Encounters:  03/25/19 214 lb (97.1 kg)  12/17/18 219 lb (99.3 kg)  11/02/18 219 lb 9.6 oz (99.6 kg)   General : Well sounding patient in no apparent distress HEENT: no hoarseness, no cough for duration of visit Lungs: speaks in complete sentences, no audible wheezing, no apparent distress Neurological: alert, oriented x 3 Psychiatric: pleasant, judgement appropriate     Izora Ribas, NP 10:53 AM [facility]

## 2019-03-25 ENCOUNTER — Encounter: Payer: Self-pay | Admitting: Adult Health

## 2019-03-25 ENCOUNTER — Ambulatory Visit: Payer: 59 | Admitting: Psychiatry

## 2019-03-25 ENCOUNTER — Other Ambulatory Visit: Payer: Self-pay

## 2019-03-25 ENCOUNTER — Ambulatory Visit: Payer: Medicare Other | Admitting: Adult Health

## 2019-03-25 ENCOUNTER — Ambulatory Visit (INDEPENDENT_AMBULATORY_CARE_PROVIDER_SITE_OTHER): Payer: Medicare Other | Admitting: Psychiatry

## 2019-03-25 VITALS — BP 130/76 | Temp 97.8°F | Wt 214.0 lb

## 2019-03-25 DIAGNOSIS — G250 Essential tremor: Secondary | ICD-10-CM

## 2019-03-25 DIAGNOSIS — E782 Mixed hyperlipidemia: Secondary | ICD-10-CM

## 2019-03-25 DIAGNOSIS — N183 Chronic kidney disease, stage 3 unspecified: Secondary | ICD-10-CM

## 2019-03-25 DIAGNOSIS — R7309 Other abnormal glucose: Secondary | ICD-10-CM | POA: Diagnosis not present

## 2019-03-25 DIAGNOSIS — K219 Gastro-esophageal reflux disease without esophagitis: Secondary | ICD-10-CM

## 2019-03-25 DIAGNOSIS — F331 Major depressive disorder, recurrent, moderate: Secondary | ICD-10-CM

## 2019-03-25 DIAGNOSIS — I10 Essential (primary) hypertension: Secondary | ICD-10-CM | POA: Diagnosis not present

## 2019-03-25 DIAGNOSIS — Z79899 Other long term (current) drug therapy: Secondary | ICD-10-CM

## 2019-03-25 DIAGNOSIS — E559 Vitamin D deficiency, unspecified: Secondary | ICD-10-CM | POA: Diagnosis not present

## 2019-03-25 DIAGNOSIS — E669 Obesity, unspecified: Secondary | ICD-10-CM

## 2019-03-25 NOTE — Progress Notes (Signed)
[facility] Counselor/Therapist Progress Note  Patient ID: Cody Mussell., MRN: [id number],    Date: 03/25/2019  Time Spent:  54  Minutes    9:00am to 10:00am  Treatment Type: Individual Therapy   Reported Symptoms:  Anxiety, depression, irritability  Any Health or Medication changes since last appt:  Patient reports none.  Mental Status Exam:  Appearance:   casual  Behavior:  Sharing  Motor:  Normal  Speech/Language:   Normal Rate  Affect:  anxious  Mood:  Irritability, depression; anxiety  Thought process:  normal  Thought content:    WNL  Sensory/Perceptual disturbances:    WNL  Orientation:  oriented to person, place, time/date, situation, day of week, month of year and year  Attention:  Good  Concentration:  Good  Memory:  WNL  Fund of knowledge:   Good  Insight:    Fair  Judgment:   Good  Impulse Control:  Good   Risk Assessment: Danger to Self:  No Self-injurious Behavior: No Danger to Others: No Duty to Warn:no Physical Aggression / Violence:No  Access to Firearms a concern: No  Gang Involvement:No   Subjective: Patient reports some increase in depression within past week.  He feels it is mostly due to a long-time friend and neighbor became sick without much warning, ended up in hospital, "tested negative for COVIC-19", but placed on respirator, attempted tracheotomy, but he had heart attack during procedure and died.  Mixed feelings due to some issues that came up in their relationship over the years. Took most of session to process his feeling about all this and he seemed to feel better after venting.  Also discussed some family issues which he led into some comments on separated wife.  Very hard for patient to practice "looking for positives and looking for what might go right in situations." Discussed this more today and how it is really to his advantage to develop these skills, however just difficult to retain. Patient and I reviewed again how he  can become more aware when he gets "hooked" by things in the past and be able to make the positive choice to let-go with more intentionality and not drag things back up from past.  Also looked at how he can become successful at looking for what might go wrong versus right, and that it will take consistent effort.   *(review each session)  Reviewed how to not engage with someone when they are being negative highly critical, while practicing "Letting Go" of things that pull him down, and focus more on not repeatedly "re-hashing", but rather moving forward without the weight of old hurts/resentment/regrets.  Reviewed strategies to help patient hold onto more positives and work on the letting go of past hurts, and also improve his self-talk. Goal review (per Flowsheets) and progress and need areas noted with patient.  Interventions:  Cognitive Behavioral / Solution-focused/Supportive  Diagnosis:   ICD-10-CM   1. Major depressive disorder, recurrent episode, moderate (HCC)  F33.1     Plan: Goal review with patient and some progress noted.  Patient will continue work on CBT strategies as well as improved self care (healthier eating, trying to have more connections to people).  Continue to work on staying in the present (versus dwelling on past) and practice looking for things to go well rather than not going well.  Also to consider ways that he can be more connected with other people during this pandemic time, especially with use of technology.  Next appt in 1 week to continue goal-directed treatment.   Shanon Ace, LCSW

## 2019-03-29 ENCOUNTER — Other Ambulatory Visit: Payer: Self-pay

## 2019-03-29 ENCOUNTER — Ambulatory Visit (INDEPENDENT_AMBULATORY_CARE_PROVIDER_SITE_OTHER): Payer: Medicare Other | Admitting: Psychiatry

## 2019-03-29 DIAGNOSIS — F331 Major depressive disorder, recurrent, moderate: Secondary | ICD-10-CM | POA: Diagnosis not present

## 2019-03-29 NOTE — Progress Notes (Signed)
[facility] Counselor/Therapist Progress Note  Patient ID: Miner Koral., MRN: [id number],    Date: 03/29/2019  Time Spent:  64  Minutes    8:00am to 9:00am  Treatment Type: Individual Therapy   Reported Symptoms:  Anxiety, depression  Any Health or Medication changes since last appt:  Patient reports none.  Mental Status Exam:  Appearance:   casual  Behavior:  Sharing  Motor:  Normal  Speech/Language:   Normal Rate  Affect:  anxious  Mood:  depression; anxiety  Thought process:  normal  Thought content:    WNL  Sensory/Perceptual disturbances:    WNL  Orientation:  oriented to person, place, time/date, situation, day of week, month of year and year  Attention:  Good  Concentration:  Good  Memory:  WNL  Fund of knowledge:   Good  Insight:    Fair  Judgment:   Good  Impulse Control:  Good   Risk Assessment: Danger to Self:  No Self-injurious Behavior: No Danger to Others: No Duty to Warn:no Physical Aggression / Violence:No  Access to Firearms a concern: No  Gang Involvement:No   Subjective:  Patient reports symptoms of depression and anxiety today, and overall mood is some better than last appt. Smiles some today and enjoyed sharing about his recent Father's Day visit to see his son and family in [place].  Also processed more of his feelings about his friend's death that had happened just before last appt.  Talked of his relationship with the friend over the years which was a mix of serious and humorous.   Still having difficulty letting go of negativity with separated wife and other people where there have been difficulties in the past.  Will "review" some negative experiences in past with separated wife and finds it hard to move forward, yet he does not feel he dwells on negativity and the past.   Patient and I reviewed again how he can become more aware when he gets "hooked" by things in the past and be able to make the positive choice to let-go with more  intentionality and not drag things back up from past.  Also looked at how he can become successful at looking for what might go Right versus Wrong, and that it will take consistent effort. Has had occasional moments that he reports doing this, but difficult to do it consistently.  *(review each session)  Reviewed how to not engage with someone when they are being negative highly critical, while practicing "Letting Go" of things that pull him down, and focus more on not repeatedly "re-hashing", but rather moving forward without the weight of old hurts/resentment/regrets.  Reviewed strategies to help patient hold onto more positives and work on the letting go of past hurts, and also improve his self-talk. Goal review (per Flowsheets) and progress and need areas noted with patient.  Interventions:  Cognitive Behavioral / Solution-focused/Supportive  Diagnosis:   ICD-10-CM   1. Major depressive disorder, recurrent episode, moderate (HCC)  F33.1     Plan: Goal review with patient and some progress noted.  Patient will continue work on CBT strategies as well as improved self care (healthier eating, trying to have more connections to people).  Continue to work on staying in the present (versus dwelling on past) and practice looking for things to go well rather than not going well.  Also to consider ways that he can be more connected with other people during this pandemic time, especially with use  of technology.  Next appt in 1 week to continue goal-directed treatment.   Shanon Ace, LCSW

## 2019-04-05 ENCOUNTER — Ambulatory Visit (INDEPENDENT_AMBULATORY_CARE_PROVIDER_SITE_OTHER): Payer: Medicare Other | Admitting: Psychiatry

## 2019-04-05 ENCOUNTER — Other Ambulatory Visit: Payer: Self-pay

## 2019-04-05 DIAGNOSIS — F331 Major depressive disorder, recurrent, moderate: Secondary | ICD-10-CM

## 2019-04-05 NOTE — Progress Notes (Signed)
      [facility] Counselor/Therapist Progress Note  Patient ID: Cody Cosma., MRN: [id number],    Date: 04/05/2019  Time Spent:  55  Minutes    8:00am to 9:00am  Treatment Type: Individual Therapy   Reported Symptoms:  Anxiety, depression, disappointment  Any Health or Medication changes since last appt:  Patient reports none.  Mental Status Exam:  Appearance:   casual  Behavior:  Sharing  Motor:  Normal  Speech/Language:   Normal Rate  Affect:  anxious  Mood:  depression; anxiety  Thought process:  normal  Thought content:    WNL  Sensory/Perceptual disturbances:    WNL  Orientation:  oriented to person, place, time/date, situation, day of week, month of year and year  Attention:  Good  Concentration:  Good  Memory:  WNL  Fund of knowledge:   Good  Insight:    Fair  Judgment:   Good  Impulse Control:  Good   Risk Assessment: Danger to Self:  No Self-injurious Behavior: No Danger to Others: No Duty to Warn:no Physical Aggression / Violence:No  Access to Firearms a concern: No  Gang Involvement:No   Subjective:  Patient in today with deflated affect and more depressed than anxious today.  "Daughter forgot Father's Day" and that has been tough for him to accept and move beyond.  Son did recognize Father's Day by inviting him to visit for the day and have dinner with his family, so that was a positive.  Talked through his feelings about daughter forgetting the special day, and from that went into some negative interactions/feeelings re: separated wife.  Was eventually able to focus on a couple positives in his life right now.  At times it's really difficult for patient to intentionally look for things that go well versus things that do not go well.  Patient feels he is making progress on his goals as we reviewed them today.    *(review each session)  Reviewed how to not engage with someone when they are being negative highly critical, while practicing "Letting  Go" of things that pull him down, and focus more on not repeatedly "re-hashing", but rather moving forward without the weight of old hurts/resentment/regrets.  Reviewed strategies to help patient hold onto more positives and work on the letting go of past hurts, and also improve his self-talk. Goal review (per Flowsheets) and progress and need areas noted with patient.  Interventions:  Cognitive Behavioral / Solution-focused/Supportive  Diagnosis:   ICD-10-CM   1. Major depressive disorder, recurrent episode, moderate (HCC)  F33.1     Plan: Goal review with patient and some progress noted.  Patient will continue work on CBT strategies as well as improved self care (healthier eating, trying to have more connections to people).  Continue to work on staying in the present (versus dwelling on past) and practice looking for things to go well rather than not going well.  Also to consider ways that he can be more connected with family (esp his son and sisters) other people during this pandemic time, especially with use of technology.  Next appt in 1 week to continue goal-directed treatment.   Shanon Ace, LCSW

## 2019-04-12 ENCOUNTER — Ambulatory Visit (INDEPENDENT_AMBULATORY_CARE_PROVIDER_SITE_OTHER): Payer: Medicare Other | Admitting: Psychiatry

## 2019-04-12 ENCOUNTER — Other Ambulatory Visit: Payer: Self-pay

## 2019-04-12 DIAGNOSIS — F331 Major depressive disorder, recurrent, moderate: Secondary | ICD-10-CM | POA: Diagnosis not present

## 2019-04-12 NOTE — Progress Notes (Signed)
      [facility] Counselor/Therapist Progress Note  Patient ID: Cody Kullman., MRN: [id number],    Date: 04/12/2019  Time Spent:  51  Minutes    8:00am to 9:00am  Treatment Type: Individual Therapy   Reported Symptoms:  Anxiety, depression, "but some better"  Any Health or Medication changes since last appt:  Patient reports none.  Mental Status Exam:  Appearance:   casual  Behavior:  Sharing  Motor:  Normal  Speech/Language:   Normal Rate  Affect:  anxious  Mood:  depression; anxiety  Thought process:  normal  Thought content:    WNL  Sensory/Perceptual disturbances:    WNL  Orientation:  oriented to person, place, time/date, situation, day of week, month of year and year  Attention:  Good  Concentration:  Good  Memory:  WNL  Fund of knowledge:   Good  Insight:    Fair  Judgment:   Good  Impulse Control:  Good   Risk Assessment: Danger to Self:  No Self-injurious Behavior: No Danger to Others: No Duty to Warn:no Physical Aggression / Violence:No  Access to Firearms a concern: No  Gang Involvement:No   Subjective:  Patient in today with less depressed affect and states he is "some better".   Recently had several "headaches' to deal with at work and some home repairs.  Repairs have been done and work has gone a little better and patient felt that has contributed to his feeling some better.  At times it's really difficult for patient to intentionally look for things that go well versus things that do not go well.  Patient feels he is making progress on his goals as we reviewed them today. His mood does seem lighter at times as he told some funny things that had happened at his work and was able to laugh about them.   *(review each session)  * Reviewed today with patient and noted that he has dealt better with these patterns this past week, reflecting on who he has been around and how the week has been different in a positive way.   Reviewed how to not engage with  someone when they are being negative highly critical, while practicing "Letting Go" of things that pull him down, and focus more on not repeatedly "re-hashing", but rather moving forward without the weight of old hurts/resentment/regrets.  Reviewed strategies to help patient hold onto more positives and work on the letting go of past hurts, and also improve his self-talk. Goal review (per Flowsheets) and progress and need areas noted with patient.  Interventions:  Cognitive Behavioral / Solution-focused/Supportive  Diagnosis:   ICD-10-CM   1. Major depressive disorder, recurrent episode, moderate (HCC)  F33.1     Plan: Goal review with patient and some progress noted.  Reviewed with patient and he is to continue work on CBT strategies as well as improved self care (healthier eating, trying to have more connections to people).  Continue to work on staying in the present (versus dwelling on past) and practice looking for things to go well rather than not going well.  Also to consider ways that he can be more connected with family (esp his son and sisters) other people during this pandemic time, especially with use of technology.  Next appt in 1 week to continue goal-directed treatment.   Shanon Ace, LCSW

## 2019-04-19 ENCOUNTER — Other Ambulatory Visit: Payer: Self-pay

## 2019-04-19 ENCOUNTER — Ambulatory Visit (INDEPENDENT_AMBULATORY_CARE_PROVIDER_SITE_OTHER): Payer: Medicare Other | Admitting: Psychiatry

## 2019-04-19 DIAGNOSIS — F331 Major depressive disorder, recurrent, moderate: Secondary | ICD-10-CM | POA: Diagnosis not present

## 2019-04-19 NOTE — Progress Notes (Signed)
[facility] Counselor/Therapist Progress Note  Patient ID: Cody Hines., MRN: [id number],    Date: 04/19/2019  Time Spent:  22  Minutes    8:00am to 9:00am  Treatment Type: Individual Therapy   Reported Symptoms:  Anxiety, depression, "but still some better"  Any Health or Medication changes since last appt:   Patient reports no changes.  Mental Status Exam:  Appearance:   casual  Behavior:  Sharing  Motor:  Normal  Speech/Language:   Normal Rate  Affect:  anxious  Mood:  depression; anxiety  Thought process:  normal  Thought content:    WNL  Sensory/Perceptual disturbances:    WNL  Orientation:  oriented to person, place, time/date, situation, day of week, month of year and year  Attention:  Good  Concentration:  Good  Memory:  WNL  Fund of knowledge:   Good  Insight:    Fair  Judgment:   Good  Impulse Control:  Good   Risk Assessment: Danger to Self:  No Self-injurious Behavior: No Danger to Others: No Duty to Warn:no Physical Aggression / Violence:No  Access to Firearms a concern: No  Gang Involvement:No   Subjective:  Patient in today reporting that he still is experiencing anxiety and depression and "is still some better." Has gotten out a little more and visited his sister, daughter & grandson, and a couple long-time friends.  Discussed another friend who committed suicide within past 2 wks and patient feels the friend was increasingly discouraged about world situation. Reviewed "homework" about intentionally looking for positives versus assuming or looking for negatives. Difficulty in doing this but he is making efforts and sometimes is successful, sharing some of this in session today.  Mood seems some better today and it was noticeable that he only mentioned his separated wife 4 times during the session which was a real improvement for patient.  In past, when he mentions her in conversation, it always impacts him negatively.  His mood does seem  lighter today and able to laugh at a couple funny situations he spoke about from family and friends.    *(review each session)  * Reviewed today with patient and noted that he has dealt some better with these patterns this past week, reflecting on who he has been around and how the week has been different in a positive way.   Reviewed how to not engage with someone when they are being negative highly critical, while practicing "Letting Go" of things that pull him down, and focus more on not repeatedly "re-hashing", but rather moving forward without the weight of old hurts/resentment/regrets.  Reviewed strategies to help patient hold onto more positives and work on the letting go of past hurts, and also improve his self-talk. Goal review (per Flowsheets) and progress and need areas noted with patient.  Interventions:  Cognitive Behavioral / Solution-focused/Supportive  Diagnosis:   ICD-10-CM   1. Major depressive disorder, recurrent episode, moderate (HCC)  F33.1     Plan: Goal review with patient and some progress noted.  Reviewed with patient and he is to continue work on CBT strategies as well as improved self care (healthier eating, trying to have more connections to people).  Continue to work on staying in the present (versus dwelling on past) and practice looking for things to go well rather than not going well.  Also to consider ways that he can be more connected with family (esp his son and sisters) other people during this pandemic  time, especially with use of technology.  Next appt in 1 week to continue goal-directed treatment.   Shanon Ace, LCSW

## 2019-04-26 ENCOUNTER — Ambulatory Visit (INDEPENDENT_AMBULATORY_CARE_PROVIDER_SITE_OTHER): Payer: Medicare Other | Admitting: Psychiatry

## 2019-04-26 ENCOUNTER — Other Ambulatory Visit: Payer: Self-pay

## 2019-04-26 DIAGNOSIS — F331 Major depressive disorder, recurrent, moderate: Secondary | ICD-10-CM

## 2019-04-26 NOTE — Progress Notes (Signed)
[facility] Counselor/Therapist Progress Note  Patient ID: Cody Matlack., MRN: [id number],    Date: 04/26/2019  Time Spent:  68  Minutes    8:00am to 9:00am  Treatment Type: Individual Therapy   Reported Symptoms:  Anxiety, depression,  but still some better"  Any Health or Medication changes since last appt:   Patient reports no changes except "I get forgetful at times and misplace keys, etc, but do eventually find them."  States that he has talked with his PCP about this during his visits there.  Mental Status Exam:  Appearance:   casual  Behavior:  Sharing  Motor:  Normal  Speech/Language:   Normal Rate  Affect:  anxious  Mood:  depression; anxiety  Thought process:  normal  Thought content:    WNL  Sensory/Perceptual disturbances:    WNL  Orientation:  oriented to person, place, time/date, situation, day of week, month of year and year  Attention:  Good  Concentration:  Good  Memory:  WNL  Fund of knowledge:   Good  Insight:    Fair  Judgment:   Good  Impulse Control:  Good   Risk Assessment: Danger to Self:  No Self-injurious Behavior: No Danger to Others: No Duty to Warn:no Physical Aggression / Violence:No  Access to Firearms a concern: No  Gang Involvement:No   Subjective:  Patient in today reporting symptoms listed above.  Does acknowledge he has improved some with his depression and anxiety.  Good that has started to  recognize some positives at times, as he has struggled a lot with negativity and looking for what may go wrong. Talked about how to support this going forward and it remains a part of his ongoing "homework" between sessions. Staying in contact with sister who lives locally and with children and grandchildren virtually or by phone.  Also in contact with a couple friends locally, occasionally seeing them at their home but following the social distancing recommendations.  Patient discussed more today the 2 recent deaths of friends, one  by illness and one by suicide. And patient states he is "doing better with it."  Is also continuing to work on not dwelling on frustrating things with his separated wife, as that always impacts him negatively.  Working on rebounding better when having more negative feelings, and being able to re-focus in positive direction.   *(review each session)  * Reviewed today with patient and noted that he has dealt some better with these patterns this past week, reflecting on who he has been around and how the week has been different in a positive way.   Reviewed how to not engage with someone when they are being negative highly critical, while practicing "Letting Go" of things that pull him down, and focus more on not repeatedly "re-hashing", but rather moving forward without the weight of old hurts/resentment/regrets.  Reviewed strategies to help patient hold onto more positives and work on the letting go of past hurts, and also improve his self-talk. Goal review (per Flowsheets) and progress and need areas noted with patient.  Interventions:  Cognitive Behavioral / Solution-focused/Supportive  Diagnosis:   ICD-10-CM   1. Major depressive disorder, recurrent episode, moderate (HCC)  F33.1     Plan: Goal review with patient and some progress noted.  Reviewed with patient and he is to continue work on CBT strategies as well as improved self care (healthier eating, trying to have more connections to people).  Continue to work on  staying in the present (versus dwelling on past) and practice looking for things to go well rather than not going well.  Next appt in 1 week to continue goal-directed treatment.   Shanon Ace, LCSW

## 2019-05-02 ENCOUNTER — Ambulatory Visit: Payer: Self-pay | Admitting: Adult Health

## 2019-05-03 ENCOUNTER — Ambulatory Visit (INDEPENDENT_AMBULATORY_CARE_PROVIDER_SITE_OTHER): Payer: Medicare Other | Admitting: Psychiatry

## 2019-05-03 ENCOUNTER — Other Ambulatory Visit: Payer: Self-pay

## 2019-05-03 DIAGNOSIS — F331 Major depressive disorder, recurrent, moderate: Secondary | ICD-10-CM

## 2019-05-03 NOTE — Progress Notes (Signed)
      [facility] Counselor/Therapist Progress Note  Patient ID: Cody Carsten., MRN: [id number],    Date: 05/03/2019  Time Spent:  61  Minutes    8:00am to 9:00am  Treatment Type: Individual Therapy   Reported Symptoms:  Anxiety, depression  Any Health or Medication changes since last appt:   Patient reports no changes since last appt.  Mental Status Exam:  Appearance:   casual  Behavior:  Sharing  Motor:  Normal  Speech/Language:   Normal Rate  Affect:  anxious  Mood:  depression; anxiety  Thought process:  normal  Thought content:    WNL  Sensory/Perceptual disturbances:    WNL  Orientation:  oriented to person, place, time/date, situation, day of week, month of year and year  Attention:  Good  Concentration:  Good  Memory:  WNL  Fund of knowledge:   Good  Insight:    Fair  Judgment:   Good  Impulse Control:  Good   Risk Assessment: Danger to Self:  No Self-injurious Behavior: No Danger to Others: No Duty to Warn:no Physical Aggression / Violence:No  Access to Firearms a concern: No  Gang Involvement:No   Subjective:  Patient in today reporting symptoms listed above.  States that he has improved some with his depression and anxiety.  Today however he tended to focus more on/fter he vented a while, we worked again today on his being able to "let go" and  Think & focus more on the present which has tended to be more positive for him. He did respond well and shared some interactions with friends that went well this past week.  Processed with him the difference he feels when his attention is on positives in his life versus his attention being on negatives, as well as strategies that can help him rebound better when situations deflate him emotionally.  Encouraged his remaining in contact with family and he shared recent contacts with a sister and both of his adult children and grandchildren. Reports today he is doing better with grief and the death of 2 friends in  recent weeks.   *(review each session)  * Reviewed today, noting progress and need areas with patient.  How to set boundaries with someone when they are being negative highly critical, while practicing "Letting Go" of things that pull him down, and focus more on not repeatedly "re-hashing", but rather moving forward without the weight of old hurts/resentment/regrets.  Reviewed strategies to help patient hold onto more positives and work on the letting go of past hurts, and also improve his self-talk. Goal review (per Flowsheets) and progress and need areas noted with patient.  Interventions:  Cognitive Behavioral / Solution-focused/Supportive  Diagnosis:   ICD-10-CM   1. Major depressive disorder, recurrent episode, moderate (HCC)  F33.1     Plan: Goal review with patient and some progress noted.  Reviewed with patient and he is to continue work on CBT strategies as well as improved self care (healthier eating, trying to have more connections to people).  Continue to work on staying in the present (versus dwelling on past) and practice looking for things to go well rather than not going well.  Next appt in 1 week to continue goal-directed treatment.   Shanon Ace, LCSW

## 2019-05-08 ENCOUNTER — Encounter: Payer: Self-pay | Admitting: Adult Health

## 2019-05-09 ENCOUNTER — Ambulatory Visit (INDEPENDENT_AMBULATORY_CARE_PROVIDER_SITE_OTHER): Payer: Medicare Other | Admitting: Psychiatry

## 2019-05-09 ENCOUNTER — Other Ambulatory Visit: Payer: Self-pay

## 2019-05-09 DIAGNOSIS — F331 Major depressive disorder, recurrent, moderate: Secondary | ICD-10-CM

## 2019-05-09 NOTE — Progress Notes (Signed)
[facility] Counselor/Therapist Progress Note  Patient ID: Cody Hineman., MRN: [id number],    Date: 05/09/2019  Time Spent:  48  Minutes    8:00am to 9:00am  Treatment Type: Individual Therapy   Reported Symptoms:  Anxiety, depression   Any Health or Medication changes since last appt:   Patient reports no changes since last appt.  Mental Status Exam:  Appearance:   casual  Behavior:  Sharing  Motor:  Normal  Speech/Language:   Normal Rate  Affect:  Anxious, irritable  Mood:  depression; anxiety  Thought process:  normal  Thought content:    WNL  Sensory/Perceptual disturbances:    WNL  Orientation:  oriented to person, place, time/date, situation, day of week, month of year and year  Attention:  Good  Concentration:  Good  Memory:  WNL  Fund of knowledge:   Good  Insight:    Fair  Judgment:   Good  Impulse Control:  Good   Risk Assessment: Danger to Self:  No Self-injurious Behavior: No Danger to Others: No Duty to Warn:no Physical Aggression / Violence:No  Access to Firearms a concern: No  Gang Involvement:No   Subjective:  Patient in today with symptoms noted above.  His anxiety and depression have increased most recently, mostly due to contacts with separated wife that did not go well. Continues to have difficulty to "letting go" even though he does seem to realize at times, that the largest percentage of his negativity comes from interactions/messages from the past. When talking about his grandkids and looking through pictures and videos, his mood changes quickly to the "positive".  Today in session, this change in affect and mood was observed several times, and tried to talk with patient about it in ways that he could accept and consider changing. Encouraged patient to be ongoing and persevering (and discussed strategies for this) in looking for "positives" in life and focusing on them, versus going back repeatedly to the "negatives" and letting them  consume him at times. Also noted that when he "stays more in the present" he does better emotionally.  He did respond well and shared some interactions with friends that went well this past week.  Processed with him again the difference he reports feeling and what I observe in sessions, when his attention is on positives in his life versus his attention being on negatives. Reviewed and demonstrated in session, strategies that can help him rebound better when situations deflate him emotionally.  Encouraged his remaining in contact with family and particular friends with which he has weekly contact.   *(review each session)  * Reviewed today, noting progress and need areas with patient.  How to set boundaries with someone when they are being negative highly critical, while practicing "Letting Go" of things that pull him down, and focus more on not repeatedly "re-hashing", but rather moving forward without the weight of old hurts/resentment/regrets.  Reviewed strategies to help patient hold onto more positives and work on the letting go of past hurts, and also improve his self-talk. Goal review (per Flowsheets) and progress and need areas noted with patient.  Interventions:  Cognitive Behavioral / Solution-focused/Supportive  Diagnosis:   ICD-10-CM   1. Major depressive disorder, recurrent episode, moderate (HCC)  F33.1     Plan: Goal review with patient and some progress noted.  Reviewed with patient and he is to continue work on CBT strategies as well as improved self care (healthier eating, trying to have  more connections to people).  Continue to work on staying in the present and "positives" (versus dwelling on past and "negatives") and practice looking for things to go well rather than not going well.  Next appt in 1 week to continue goal-directed treatment.   Shanon Ace, LCSW

## 2019-05-09 NOTE — Addendum Note (Signed)
Addended byShanon Ace on: 05/09/2019 08:59 AM   Modules accepted: Level of Service

## 2019-05-10 ENCOUNTER — Ambulatory Visit (INDEPENDENT_AMBULATORY_CARE_PROVIDER_SITE_OTHER): Payer: Medicare Other | Admitting: Adult Health

## 2019-05-10 ENCOUNTER — Other Ambulatory Visit: Payer: Self-pay

## 2019-05-10 ENCOUNTER — Encounter: Payer: Self-pay | Admitting: Adult Health

## 2019-05-10 VITALS — BP 114/63 | HR 66 | Temp 98.4°F | Ht 73.0 in | Wt 221.8 lb

## 2019-05-10 DIAGNOSIS — Z9989 Dependence on other enabling machines and devices: Secondary | ICD-10-CM | POA: Diagnosis not present

## 2019-05-10 DIAGNOSIS — G4733 Obstructive sleep apnea (adult) (pediatric): Secondary | ICD-10-CM | POA: Diagnosis not present

## 2019-05-10 NOTE — Progress Notes (Signed)
PATIENT: Cody Hines. DOB: 08/30/1946  REASON FOR VISIT: follow up HISTORY FROM: patient  HISTORY OF PRESENT ILLNESS: Today 05/10/19:  Cody Hines is a 73 year old male with a history of obstructive sleep apnea on CPAP.  He returns today for follow-up.  His CPAP download indicates that he uses machine 30 out of 30 days for compliance of 100%.  He uses machine greater than 4 hours 16 days for compliance of 53%.  On average he uses his machine 4 hours and 18 minutes.  His residual AHI is 0.7 on 11 cm of water with EPR of 2.  His leak in the 95th percentile is 19.1 liters per minute.  He states that there are some nights that he unknowingly takes off his mask.  He states that he wakes up from notices this he does put the mask back on.  He also notes that some nights he has allergies and finds it is hard to keep the nasal pillows on.  He returns today for follow-up.  HISTORY 1/28/2020CM Mr. Montour, 73 year old male returns for follow-up with history of obstructive sleep apnea here for CPAP compliance.  He has been doing well with his CPAP he does need supplies.  Compliance data dated 10/03/2018 to 11/01/2018 shows compliance greater than 4 hours 87%.  Average usage 6 hours 6 minutes set pressure 11 cm.  Leak 95th percentile at 29.  AHI 0.8.  Patient states that he has not changed the mask since last seen which was in April.  He probably just needs a new mask.  He is not aware of the leak.  He returns for reevaluation.  REVIEW OF SYSTEMS: Out of a complete 14 system review of symptoms, the patient complains only of the following symptoms, and all other reviewed systems are negative.  Headache, speech difficulty, weakness Fatigue severity score 38 Epworth sleepiness score 11  ALLERGIES: Allergies  Allergen Reactions  . Asa [Aspirin] Diarrhea    Nausea, hx ulcers  . Atorvastatin     Muscle aches  . Celebrex [Celecoxib]   . Codeine   . Penicillins   . Savella [Milnacipran Hcl]   .  Viagra [Sildenafil Citrate] Other (See Comments)    Headache    HOME MEDICATIONS: Outpatient Medications Prior to Visit  Medication Sig Dispense Refill  . aspirin EC 81 MG tablet Take 81 mg by mouth daily.    . enalapril (VASOTEC) 20 MG tablet TAKE 1 TABLET BY MOUTH  EVERY DAY FOR BLOOD  PRESSURE 90 tablet 1  . Flaxseed, Linseed, (FLAX SEED OIL) 1000 MG CAPS Take 2 capsules by mouth daily.     Marland Kitchen LORazepam (ATIVAN) 1 MG tablet Take 1 tablet (1 mg total) by mouth at bedtime. (Patient taking differently: Take 1 mg by mouth as needed. ) 30 tablet 5  . Magnesium 500 MG TABS Take by mouth daily.    Marland Kitchen OVER THE COUNTER MEDICATION Take 10,000 Units by mouth daily. Vitamin D    . rosuvastatin (CRESTOR) 40 MG tablet TAKE 1 TABLET BY MOUTH  DAILY FOR CHOLESTEROL 120 tablet 0  . sertraline (ZOLOFT) 100 MG tablet Take 2 tablets (200 mg total) by mouth daily. 180 tablet 1   No facility-administered medications prior to visit.     PAST MEDICAL HISTORY: Past Medical History:  Diagnosis Date  . BPH (benign prostatic hyperplasia)   . Cancer (HCC)    nose, skin cancer  . Elevated hemoglobin A1c   . Fibromyalgia   . GERD (  gastroesophageal reflux disease)   . Hyperlipidemia   . Hypertension   . Hypogonadism male   . IBS (irritable bowel syndrome)   . OSA (obstructive sleep apnea)     PAST SURGICAL HISTORY: Past Surgical History:  Procedure Laterality Date  .  nasal smr np3  1985  . KNEE ARTHROSCOPY Left 1999  . SKIN CANCER EXCISION  2020   nose  . SPINE SURGERY  2007   L5 S 1 Disk  . VASECTOMY  1983    FAMILY HISTORY: Family History  Problem Relation Age of Onset  . Cancer Mother        breast  . Cancer Father        lung  . Diabetes Father   . Heart disease Sister   . Arthritis Sister     SOCIAL HISTORY: Social History   Socioeconomic History  . Marital status: Legally Separated    Spouse name: Kennyth Lose  . Number of children: Not on file  . Years of education: Not on file   . Highest education level: Not on file  Occupational History  . 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 Financial resource strain: Not on file  . Food insecurity    Worry: Not on file    Inability: Not on file  . Transportation needs    Medical: Not on file    Non-medical: Not on file  Tobacco Use  . Smoking status: Former Smoker    Years: 30.00    Types: Cigarettes    Quit date: 10/06/1980    Years since quitting: 38.6  . Smokeless tobacco: Never Used  Substance and Sexual Activity  . Alcohol use: No    Frequency: Never    Comment: Not drinking x 1 month  . Drug use: No  . Sexual activity: Not Currently  Lifestyle  . Physical activity    Days per week: Not on file    Minutes per session: Not on file  . Stress: Not on file  Relationships  . Social Herbalist on phone: Not on file    Gets together: Not on file    Attends religious service: Not on file    Active member of club or organization: Not on file    Attends meetings of clubs or organizations: Not on file    Relationship status: Not on file  . Intimate partner violence    Fear of current or ex partner: Not on file    Emotionally abused: Not on file    Physically abused: Not on file    Forced sexual activity: Not on file  Other Topics Concern  . Not on file  Social History Narrative  . Not on file      PHYSICAL EXAM  Vitals:   05/10/19 0848  BP: 114/63  Pulse: 66  Temp: 98.4 F (36.9 C)  Weight: 221 lb 12.8 oz (100.6 kg)  Height: 6\' 1"  (1.854 m)   Body mass index is 29.26 kg/m.  Generalized: Well developed, in no acute distress  Chest: Lungs clear to auscultation bilaterally  Neurological examination  Mentation: Alert oriented to time, place, history taking. Follows all commands speech and language fluent Cranial nerve II-XII: Pupils were equal round reactive to light.Head turning and shoulder shrug  were normal and symmetric. Motor: The motor testing reveals 5 over 5 strength of  all 4 extremities. Good symmetric motor tone is noted throughout.  Sensory: Sensory testing is intact to soft touch on all  4 extremities. No evidence of extinction is noted.  Coordination: Cerebellar testing reveals good finger-nose-finger and heel-to-shin bilaterally.  Gait and station: Gait is slightly unsteady.  Tandem gait not attempted. Reflexes: Deep tendon reflexes are symmetric and normal bilaterally.   DIAGNOSTIC DATA (LABS, IMAGING, TESTING) - I reviewed patient records, labs, notes, testing and imaging myself where available.  Lab Results  Component Value Date   WBC 4.6 12/17/2018   HGB 14.6 12/17/2018   HCT 44.1 12/17/2018   MCV 90.0 12/17/2018   PLT 156 12/17/2018      Component Value Date/Time   NA 140 12/17/2018 0926   K 3.8 12/17/2018 0926   CL 104 12/17/2018 0926   CO2 30 12/17/2018 0926   GLUCOSE 207 (H) 12/17/2018 0926   BUN 9 12/17/2018 0926   CREATININE 1.42 (H) 12/17/2018 0926   CALCIUM 9.6 12/17/2018 0926   PROT 6.9 12/17/2018 0926   ALBUMIN 4.2 04/24/2017 1153   AST 41 (H) 12/17/2018 0926   ALT 25 12/17/2018 0926   ALKPHOS 49 04/24/2017 1153   BILITOT 1.1 12/17/2018 0926   GFRNONAA 50 (L) 12/17/2018 0926   GFRAA 58 (L) 12/17/2018 0926   Lab Results  Component Value Date   CHOL 119 12/17/2018   HDL 52 12/17/2018   LDLCALC 51 12/17/2018   TRIG 77 12/17/2018   CHOLHDL 2.3 12/17/2018   Lab Results  Component Value Date   HGBA1C 6.1 (H) 12/17/2018   No results found for: VITAMINB12 Lab Results  Component Value Date   TSH 1.91 12/17/2018      ASSESSMENT AND PLAN 73 y.o. year old male  has a past medical history of BPH (benign prostatic hyperplasia), Cancer ([place]), Elevated hemoglobin A1c, Fibromyalgia, GERD (gastroesophageal reflux disease), Hyperlipidemia, Hypertension, Hypogonadism male, IBS (irritable bowel syndrome), and OSA (obstructive sleep apnea). here with:  1.  Obstructive sleep apnea on CPAP  The patient's CPAP download shows  suboptimal compliance but good treatment of his apnea.  He is encouraged to continue using CPAP nightly and greater than 4 hours each night.  He is advised that if his symptoms worsen or he develops new symptoms he should let us know.  He will follow-up in 1 year or sooner if needed   I spent 15 minutes with the patient. 50% of this time was spent reviewing CPAP download   Ward Givens, MSN, NP-C 05/10/2019, 9:22 AM [facility] [address] [place], [place] [address] [phone]

## 2019-05-10 NOTE — Patient Instructions (Signed)

## 2019-05-16 ENCOUNTER — Other Ambulatory Visit: Payer: Self-pay

## 2019-05-16 ENCOUNTER — Ambulatory Visit (INDEPENDENT_AMBULATORY_CARE_PROVIDER_SITE_OTHER): Payer: Medicare Other | Admitting: Psychiatry

## 2019-05-16 DIAGNOSIS — F331 Major depressive disorder, recurrent, moderate: Secondary | ICD-10-CM

## 2019-05-16 NOTE — Progress Notes (Signed)
[facility] Counselor/Therapist Progress Note  Patient ID: Cody Browe., MRN: [id number],    Date: 05/16/2019  Time Spent:  99  Minutes    8:00am to 9:00am  Treatment Type: Individual Therapy   Reported Symptoms:  Anxiety, depression . Patient says "maybe a little better."  Any Health or Medication changes since last appt:   Patient reports no changes since last appt.  Mental Status Exam:  Appearance:   casual  Behavior:  Sharing  Motor:  Normal  Speech/Language:   Normal Rate  Affect:  Anxious, irritable  Mood:  depression; anxiety  Thought process:  normal  Thought content:    WNL  Sensory/Perceptual disturbances:    WNL  Orientation:  oriented to person, place, time/date, situation, day of week, month of year and year  Attention:  Good  Concentration:  Good  Memory:  WNL  Fund of knowledge:   Good  Insight:    Fair  Judgment:   Good  Impulse Control:  Good   Risk Assessment: Danger to Self:  No Self-injurious Behavior: No Danger to Others: No Duty to Warn:no Physical Aggression / Violence:No  Access to Firearms a concern: No  Gang Involvement:No   Subjective:  Patient in today with symptoms noted above and says he "may be a little better."  He explained he has "tried to follow through more and he moved his dog's lot over to where he lives".  Moving the dog seems to be a good thing for patient.  Still having some issues/mixed communication with separated wife.  States they have remained at a stand-still, and so far have not moved forward legally towards divorce. Contact with son and his children continue.  Patient feels "daughter is not as understanding nor caring."  Continues to have difficulty "letting go" of troubled feelings re: separated wife even though he does seem to realize at times, that the largest percentage of his negativity comes from interactions/messages with wife from the past.  His mood elevates when he talks about other parts of his  life including his grandkids and a couple of his friends.  Patient does notice that when he "stays more in the present" he does better emotionally.  Reviewed in session, strategies that can help him rebound better when situations deflate him emotionally.  Again encouraged his remaining in contact with family and particular friends with which he has weekly contact and feels supported.   *(review each session)  * Reviewed today, noting progress and need areas with patient.  How to set boundaries with someone when they are being negative highly critical, while practicing "Letting Go" of things that pull him down, and focus more on not repeatedly "re-hashing", but rather moving forward without the weight of old hurts/resentment/regrets.  Reviewed strategies to help patient hold onto more positives and work on the letting go of past hurts, and also improve his self-talk. Goal review (per Flowsheets) and progress and need areas noted with patient.  Interventions:  Cognitive Behavioral / Solution-focused/Supportive  Diagnosis:   ICD-10-CM   1. Major depressive disorder, recurrent episode, moderate (HCC)  F33.1     Plan: Goal review with patient.  Reviewed with patient his prior efforts to use the CBT model and says today it's just hard for him to use it "in the moment".  Is able to recall the strategy involved but admits it's hard for him to follow through.  Encouraged him to focus on good self care (healthier eating, taking meds  as prescribed, trying to have more connections to friends he knows care about him).  Continue to work on staying in the present and "positives" (versus dwelling on past and "negatives") and practice looking for things to go well rather than not going well.  Next appt in 1 week to continue goal-directed treatment.   Shanon Ace, LCSW

## 2019-05-23 ENCOUNTER — Ambulatory Visit (INDEPENDENT_AMBULATORY_CARE_PROVIDER_SITE_OTHER): Payer: Medicare Other | Admitting: Psychiatry

## 2019-05-23 ENCOUNTER — Encounter: Payer: Self-pay | Admitting: Psychiatry

## 2019-05-23 ENCOUNTER — Other Ambulatory Visit: Payer: Self-pay

## 2019-05-23 DIAGNOSIS — F411 Generalized anxiety disorder: Secondary | ICD-10-CM | POA: Diagnosis not present

## 2019-05-23 DIAGNOSIS — F5105 Insomnia due to other mental disorder: Secondary | ICD-10-CM

## 2019-05-23 DIAGNOSIS — F6381 Intermittent explosive disorder: Secondary | ICD-10-CM | POA: Diagnosis not present

## 2019-05-23 DIAGNOSIS — F1021 Alcohol dependence, in remission: Secondary | ICD-10-CM

## 2019-05-23 DIAGNOSIS — F331 Major depressive disorder, recurrent, moderate: Secondary | ICD-10-CM | POA: Diagnosis not present

## 2019-05-23 NOTE — Progress Notes (Signed)
Cody Hines [id number] [birth year] 73 y.o.  Subjective:   Patient ID:  Cody Hines. is a 73 y.o. (DOB 02-10-1946) male.  Chief Complaint:  Chief Complaint  Patient presents with  . Follow-up    Medication Management  . Depression    Medication Management    Depression        Associated symptoms include headaches.  Associated symptoms include no decreased concentration and no suicidal ideas.   Cody Hines. presents to the office today for follow-up mood problems.  Last visit March.  No med changes at last visit.  Still on sertraline.   He's continued counseling.  More good than bad days.  If sees kids and gkids no depression.  If sees wife it's worse.  Overall better with occ spells of being down but gets through them.  Wants to see gkids more and did recently and that helped.  Most days sometimes in the day feels depressed but doesn't last.  Lonely.  Work 2 days weekly in the heat and visits sisters.  Drinks lots of water.   Overall is improved but sx are not resolved. Pt reports that mood is Depressed and describes anxiety as still Moderate. Anxiety symptoms include: Excessive Worry,. Anxiety in large groups of people has to get out.  Pt reports has interrupted sleep. Better sleep when not working. Goes to bed early. Trouble staying asleep.  Busy.  sUsually enough sleep.  Pt reports that appetite is good. Pt reports that energy is poor DT heat. Better than it was.   Concentration is good. Suicidal thoughts:  denied by patient. Trying to stay active helps. Built dog fence. Less angry and resentful of others than he used to be. Outbursts are generally resolved.  Drinks caffeinated sodas all day until 9pm.  Still separated and unlikely to reconcile. Able to control myself.  Anger much better even with triggers of wife provoking him.  OK at work too.  Worked hard in therapy to manage this.  Sober 2  1/2 years.  Past Psychiatric Medication Trials:  citalopram, Wellbutrin,  Xanax.  Review of Systems:  Review of Systems  Respiratory: Negative for cough.   Neurological: Positive for tremors and headaches. Negative for weakness.  Psychiatric/Behavioral: Positive for depression and dysphoric mood. Negative for agitation, behavioral problems, confusion, decreased concentration, hallucinations, self-injury, sleep disturbance and suicidal ideas. The patient is nervous/anxious. The patient is not hyperactive.   Increase in migraine HA.  Medications: I have reviewed the patient's current medications.  Current Outpatient Medications  Medication Sig Dispense Refill  . aspirin EC 81 MG tablet Take 81 mg by mouth daily.    . enalapril (VASOTEC) 20 MG tablet TAKE 1 TABLET BY MOUTH  EVERY DAY FOR BLOOD  PRESSURE 90 tablet 1  . Flaxseed, Linseed, (FLAX SEED OIL) 1000 MG CAPS Take 2 capsules by mouth daily.     Marland Kitchen LORazepam (ATIVAN) 1 MG tablet Take 1 tablet (1 mg total) by mouth at bedtime. (Patient taking differently: Take 1 mg by mouth as needed. ) 30 tablet 5  . Magnesium 500 MG TABS Take by mouth daily.    Marland Kitchen OVER THE COUNTER MEDICATION Take 10,000 Units by mouth daily. Vitamin D    . rosuvastatin (CRESTOR) 40 MG tablet TAKE 1 TABLET BY MOUTH  DAILY FOR CHOLESTEROL 120 tablet 0  . sertraline (ZOLOFT) 100 MG tablet Take 2 tablets (200 mg total) by mouth daily. 180 tablet 1   No current facility-administered medications  for this visit.     Medication Side Effects: None  Allergies:  Allergies  Allergen Reactions  . Asa [Aspirin] Diarrhea    Nausea, hx ulcers  . Atorvastatin     Muscle aches  . Celebrex [Celecoxib]   . Codeine   . Penicillins   . Savella [Milnacipran Hcl]   . Viagra [Sildenafil Citrate] Other (See Comments)    Headache    Past Medical History:  Diagnosis Date  . BPH (benign prostatic hyperplasia)   . Cancer (HCC)    nose, skin cancer  . Elevated hemoglobin A1c   . Fibromyalgia   . GERD (gastroesophageal reflux  disease)   . Hyperlipidemia   . Hypertension   . Hypogonadism male   . IBS (irritable bowel syndrome)   . OSA (obstructive sleep apnea)     Family History  Problem Relation Age of Onset  . Cancer Mother        breast  . Cancer Father        lung  . Diabetes Father   . Heart disease Sister   . Arthritis Sister     Social History   Socioeconomic History  . Marital status: Legally Separated    Spouse name: Cody Hines  . Number of children: Not on file  . Years of education: Not on file  . Highest education level: Not on file  Occupational History  . 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 Financial resource strain: Not on file  . Food insecurity    Worry: Not on file    Inability: Not on file  . Transportation needs    Medical: Not on file    Non-medical: Not on file  Tobacco Use  . Smoking status: Former Smoker    Years: 30.00    Types: Cigarettes    Quit date: 10/06/1980    Years since quitting: 38.6  . Smokeless tobacco: Never Used  Substance and Sexual Activity  . Alcohol use: No    Frequency: Never    Comment: Not drinking x 1 month  . Drug use: No  . Sexual activity: Not Currently  Lifestyle  . Physical activity    Days per week: Not on file    Minutes per session: Not on file  . Stress: Not on file  Relationships  . Social Herbalist on phone: Not on file    Gets together: Not on file    Attends religious service: Not on file    Active member of club or organization: Not on file    Attends meetings of clubs or organizations: Not on file    Relationship status: Not on file  . Intimate partner violence    Fear of current or ex partner: Not on file    Emotionally abused: Not on file    Physically abused: Not on file    Forced sexual activity: Not on file  Other Topics Concern  . Not on file  Social History Narrative  . Not on file    Past Medical History, Surgical history, Social history, and Family history were reviewed and updated as  appropriate.   Please see review of systems for further details on the patient's review from today.   Objective:   Physical Exam:  There were no vitals taken for this visit.  Physical Exam Constitutional:      General: He is not in acute distress.    Appearance: He is well-developed.  Musculoskeletal:  General: No deformity.  Neurological:     Mental Status: He is alert and oriented to person, place, and time.     Motor: No tremor.     Coordination: Coordination normal.     Gait: Gait normal.  Psychiatric:        Attention and Perception: He is attentive. He does not perceive auditory hallucinations.        Mood and Affect: Mood is not anxious or depressed. Affect is not labile, blunt, angry or inappropriate.        Speech: Speech normal.        Behavior: Behavior normal.        Thought Content: Thought content normal. Thought content does not include homicidal or suicidal ideation. Thought content does not include homicidal or suicidal plan.        Cognition and Memory: Cognition normal.        Judgment: Judgment normal.     Comments: Insight intact. No auditory or visual hallucinations.  Overall less irritable and angry with people. Less blunted, more humor. Talkative.     Lab Review:     Component Value Date/Time   NA 140 12/17/2018 0926   K 3.8 12/17/2018 0926   CL 104 12/17/2018 0926   CO2 30 12/17/2018 0926   GLUCOSE 207 (H) 12/17/2018 0926   BUN 9 12/17/2018 0926   CREATININE 1.42 (H) 12/17/2018 0926   CALCIUM 9.6 12/17/2018 0926   PROT 6.9 12/17/2018 0926   ALBUMIN 4.2 04/24/2017 1153   AST 41 (H) 12/17/2018 0926   ALT 25 12/17/2018 0926   ALKPHOS 49 04/24/2017 1153   BILITOT 1.1 12/17/2018 0926   GFRNONAA 50 (L) 12/17/2018 0926   GFRAA 58 (L) 12/17/2018 0926       Component Value Date/Time   WBC 4.6 12/17/2018 0926   RBC 4.90 12/17/2018 0926   HGB 14.6 12/17/2018 0926   HCT 44.1 12/17/2018 0926   PLT 156 12/17/2018 0926   MCV 90.0  12/17/2018 0926   MCH 29.8 12/17/2018 0926   MCHC 33.1 12/17/2018 0926   RDW 13.2 12/17/2018 0926   LYMPHSABS 1,578 12/17/2018 0926   MONOABS 670 04/24/2017 1153   EOSABS 120 12/17/2018 0926   BASOSABS 28 12/17/2018 0926    No results found for: POCLITH, LITHIUM   No results found for: PHENYTOIN, PHENOBARB, VALPROATE, CBMZ   .res Assessment: Plan:    Rishav was seen today for follow-up and depression.  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 We discussed his chronic depression and anxiety and irritability, anger.  He's markedly better but has residual symptoms.  Most of mood problem is situational.   Benefit from meds and therapy. No change in therapy indicated.  Needs to continue the meds bc of a high relapse risk.  Supportive therapy re: dealing with stress of separation. Doesn't look like things will change.  Option potentiate with something but defer.  Not likely to help bc it appears mostly situational.  Disc caffeine and sleep.  Takes it up to 9 pm.  Rec avoid late caffeine.  We discussed the short-term risks associated with benzodiazepines including sedation and increased fall risk among others.  Discussed long-term side effect risk  including dependence, potential withdrawal symptoms, and the potential eventual dose-related risk of dementia.  No evidence of abuse.  Disc sobriety maintenance.    No med changes indicated.  This appointment was 15 minutes  FU 5  mos.  Lynder Parents, MD, DFAPA   Please see After Visit Summary for patient specific instructions.  Future Appointments  Date Time Provider [place]  05/24/2019  8:00 AM Shanon Ace, LCSW [facility] None  05/30/2019  8:00 AM Shanon Ace, LCSW [facility] None  06/06/2019  8:00 AM Shanon Ace, LCSW [facility] None  06/14/2019  8:00 AM Shanon Ace, LCSW [facility] None  06/20/2019  8:00 AM Shanon Ace, LCSW [facility] None  06/27/2019  8:00 AM Shanon Ace, LCSW [facility] None  06/27/2019 10:30 AM [name], MD [facility] None  07/04/2019  8:00 AM Shanon Ace, LCSW [facility] None  07/11/2019  8:00 AM Shanon Ace, LCSW [facility] None  09/27/2019  9:00 AM Liane Comber, NP [facility] None  01/03/2020  9:00 AM [name], MD [facility] None  05/14/2020  9:00 AM Ward Givens, NP [facility] None    No orders of the defined types were placed in this encounter.     -------------------------------

## 2019-05-24 ENCOUNTER — Ambulatory Visit (INDEPENDENT_AMBULATORY_CARE_PROVIDER_SITE_OTHER): Payer: Medicare Other | Admitting: Psychiatry

## 2019-05-24 ENCOUNTER — Other Ambulatory Visit: Payer: Self-pay

## 2019-05-24 DIAGNOSIS — F331 Major depressive disorder, recurrent, moderate: Secondary | ICD-10-CM | POA: Diagnosis not present

## 2019-05-24 NOTE — Progress Notes (Signed)
[facility] Counselor/Therapist Progress Note  Patient ID: Cody Pop., MRN: [id number],    Date: 05/24/2019  Time Spent:  74  Minutes    8:00am to 9:00am  Treatment Type: Individual Therapy   Reported Symptoms:  Anxiety, depression . Patient says "maybe a little better."  Any Health or Medication changes since last appt:   Patient reports no changes since last appt.  Mental Status Exam:  Appearance:   casual  Behavior:  Sharing  Motor:  Normal  Speech/Language:   Normal Rate  Affect:  Anxious  Mood:  depression; anxiety  Thought process:  normal  Thought content:    WNL  Sensory/Perceptual disturbances:    WNL  Orientation:  oriented to person, place, time/date, situation, day of week, month of year and year  Attention:  Good  Concentration:  Good  Memory:  WNL  Fund of knowledge:   Good  Insight:    Fair  Judgment:   Good  Impulse Control:  Good   Risk Assessment: Danger to Self:  No Self-injurious Behavior: No Danger to Others: No Duty to Warn:no Physical Aggression / Violence:No  Access to Firearms a concern: No  Gang Involvement:No   Subjective:  Patient in today with symptoms noted above and says that things are still a little better, as he had noted the same last session. Is noticeably less irritable today.  Heard from both adult children this past week so that helped.  Does not seem to be quite as critical of others today although did mention his separated wife 3 times.  Mood is definitely better overall, but sometimes will dip back into more depressive and angry mood.  Does have a quicker recovery time and doesn't typically stay in angry/resentful mood.   Moving his dog to be at home with him, seems to be a good thing for patient as it keeps the dog with him and not running loose at times.   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 Working hard to stay "in the present"  as that tends to help patient feel less negativity. Mood is always more positive when he's had contact with kids and grandkids. Again reviewed strategies that can help him rebound when his mood is more down and deflated. Goal review and progressing.  Denies any SI.   *(review each session)  * Reviewed today, noting progress and need areas with patient.  How to set boundaries with someone when they are being negative highly critical, while practicing "Letting Go" of things that pull him down, and focus more on not repeatedly "re-hashing", but rather moving forward without the weight of hurts/resentment/regrets.  Reviewed strategies to help patient hold onto more positives and work on the letting go of past hurts, and also improve his self-talk. Goal review (per Flowsheets) and progress and need areas noted with patient.  Interventions:  Cognitive Behavioral / Solution-focused/Supportive  Diagnosis:   ICD-10-CM   1. Major depressive disorder, recurrent episode, moderate (HCC)  F33.1     Plan: Goal review with patient.  Reviewed with patient his prior efforts to use the CBT model and says today it's just hard for him to use it "in the moment".  Is able to recall the strategy involved but admits it's hard for him to follow through.  Encouraged him to focus on good self care (healthier eating, taking meds as prescribed, trying to have more connections to  friends he knows care about him).  Continue to work on staying in the present and "positives" (versus dwelling on past and "negatives") and practice looking for things to go well rather than not going well.  Next appt in 1 week to continue goal-directed treatment.   Shanon Ace, LCSW

## 2019-05-30 ENCOUNTER — Other Ambulatory Visit: Payer: Self-pay

## 2019-05-30 ENCOUNTER — Ambulatory Visit (INDEPENDENT_AMBULATORY_CARE_PROVIDER_SITE_OTHER): Payer: Medicare Other | Admitting: Psychiatry

## 2019-05-30 DIAGNOSIS — F331 Major depressive disorder, recurrent, moderate: Secondary | ICD-10-CM

## 2019-05-30 NOTE — Progress Notes (Signed)
[facility] Counselor/Therapist Progress Note  Patient ID: [name], MRN: [id number],    Date: 05/30/2019  Time Spent:  17  Minutes    8:00am to 9:00am  Treatment Type: Individual Therapy   Reported Symptoms:  Anxiety, depression . Patient states "some better at times."  Any Health or Medication changes since last appt:   Patient reports no changes since last appt.  Mental Status Exam:  Appearance:   casual  Behavior:  Sharing  Motor:  Normal  Speech/Language:   Normal Rate  Affect:  Anxious  Mood:  depression; anxiety  Thought process:  normal  Thought content:    WNL  Sensory/Perceptual disturbances:    WNL  Orientation:  oriented to person, place, time/date, situation, day of week, month of year and year  Attention:  Good  Concentration:  Good  Memory:  WNL  Fund of knowledge:   Good  Insight:    Fair  Judgment:   Good  Impulse Control:  Good   Risk Assessment: Danger to Self:  No Self-injurious Behavior: No Danger to Others: No Duty to Warn:no Physical Aggression / Violence:No  Access to Firearms a concern: No  Gang Involvement:No   Subjective:  Patient in today with symptoms as listed above, and he adds he is "some better at times".  Per his explanation, his mood is easily affected by circumstances and other people in his life.  He does better when he doesn't have contact with certain extended family members that seem to always clash with patient, and difficult for patient to choose to not get involved and distance himself.  Had an incident this past week with separated wife that went in negative direction and patient reports his deflated mood was better within a couple days,  Over the weekend had a good visit with both adult children and his grandkids which was good for patient. Mood is always more positive when he's had contact with kids and grandkids. Mood is definitely better overall, but sometimes will dip back into more depressive and angry  mood, and almost always is precipitated by a negative interaction with someone.  Remains alcohol-free and will reach 2 year mark in October. Working hard to stay "in the present" as that tends to help patient feel less negativity. Again reviewed strategies that can help him rebound when his mood is more down and deflated. Goal review and progressing.  Denies any SI.   *(review each session)  * Reviewed today, noting progress and need areas with patient.  How to set boundaries with someone when they are being negative highly critical, while practicing "Letting Go" of things that pull him down, and focus more on not repeatedly "re-hashing", but rather moving forward without the weight of hurts/resentment/regrets.  Reviewed strategies to help patient hold onto more positives and work on the letting go of past hurts, and also improve his self-talk. Goal review (per Flowsheets) and progress and need areas noted with patient.  Interventions:  Cognitive Behavioral / Solution-focused/Supportive  Diagnosis:   ICD-10-CM   1. Major depressive disorder, recurrent episode, moderate (HCC)  F33.1     Plan: Goal review with patient.  Reviewed with patient his prior efforts to use the CBT model and says today it's just hard for him to use it "in the moment", so we processed how he could work on "in the moment" actions and reactions.  Is able to recall the strategy involved but admits it's hard for him to follow through.  Continue to encourage him to focus on good self care (healthier eating, taking meds as prescribed, trying to have more connections to friends he knows care about him).  Continue to work on staying in the present and "positives" (versus dwelling on past and "negatives") and practice looking for things to go well rather than not going well.  Especially focused on working to change his thinking patterns which can help him stay more in present and repond more effectively in the moment.  Next appt in 1 week  to continue goal-directed treatment.   Shanon Ace, LCSW

## 2019-06-06 ENCOUNTER — Other Ambulatory Visit: Payer: Self-pay

## 2019-06-06 ENCOUNTER — Ambulatory Visit (INDEPENDENT_AMBULATORY_CARE_PROVIDER_SITE_OTHER): Payer: Medicare Other | Admitting: Psychiatry

## 2019-06-06 DIAGNOSIS — F331 Major depressive disorder, recurrent, moderate: Secondary | ICD-10-CM

## 2019-06-06 NOTE — Progress Notes (Signed)
[facility] Counselor/Therapist Progress Note  Patient ID: Cody Hines., MRN: [id number],    Date: 06/06/2019  Time Spent:  74  Minutes    8:00am to 9:00am  Treatment Type: Individual Therapy   Reported Symptoms:  Anxiety, depression   Any Health or Medication changes since last appt:   Patient reports no changes since last appt.  Mental Status Exam:  Appearance:   casual  Behavior:  Sharing  Motor:  Normal  Speech/Language:   Normal Rate  Affect:  Anxious  Mood:  depression; anxiety  Thought process:  normal  Thought content:    WNL  Sensory/Perceptual disturbances:    WNL  Orientation:  oriented to person, place, time/date, situation, day of week, month of year and year  Attention:  Good  Concentration:  Good  Memory:  WNL  Fund of knowledge:   Good  Insight:    Fair  Judgment:   Good  Impulse Control:  Good   Risk Assessment: Danger to Self:  No Self-injurious Behavior: No Danger to Others: No Duty to Warn:no Physical Aggression / Violence:No  Access to Firearms a concern: No  Gang Involvement:No   Subjective:  Patient reporting continued depression and anxiety, and that "I don't sometimes get as worked-up as I used to".  "I try not to dwell on bad things and sometimes that's hard but am working on it."  Has remained alcohol-free for almost 2 years and that has helped him a lot, per family and patient.  At his part-time job, he can easily get frustrated with situations and co-workers and finds it hard to "let go" and not get stuck recycling the negatives.  Other times, mostly with family he gets along with and friends, he is more upbeat and can laugh and enjoy himself more. Separated wife and patient still have difficult time communicating, although did have one brief interaction with her this past week and patient states it was not as bad as usual as it did not end in an argument. Helping point out and emphasizing the "positives" with patient, and  following up on goal work of developing healthier cognitive patterns about himself and the world to help in alleviating his depressive thought patterns.  Still some anxiety and depressed mood and is definitely better overall, but sometimes will dip back into more depressive and angry mood, and almost always is precipitated by a negative interaction with someone. Denies any SI.     *(review each session)  * Reviewed today, noting progress and need areas with patient, as the repetition of this has seemed "to help patient make progress in areas he has struggled with for years".  How to set boundaries with someone when they are being negative highly critical, while practicing "Letting Go" of things that pull him down, and focus more on not repeatedly "re-hashing", but rather moving forward without the weight of hurts/resentment/regrets.  Reviewed strategies to help patient hold onto more positives and work on the letting go of past hurts, and also improve his self-talk. Goal review (per Flowsheets) and progress and need areas noted with patient.  Interventions:  Cognitive Behavioral / Solution-focused/Supportive  Diagnosis:   ICD-10-CM   1. Major depressive disorder, recurrent episode, moderate (HCC)  F33.1     Plan: Goal review with patient, per Flowsheets, and progress noted.  CBT model reviewed with patient especially helping him be able to use it "in the moment" as needed.  Continue to encourage him to focus on good self  care (healthier eating, positive affirmations,  taking meds as prescribed, trying to have more connections to friends he knows care about him).  Continue to work on staying in the present and focus on "positives" (versus dwelling on past and "negatives") and practice looking for things to go well rather than not going well.  Especially focused on working to change his thinking patterns which can help him experience less depressed mood, stay more in present, and repond more effectively in  the moment.  Next appt in 1-2 wks week to continue goal-directed treatment.   Shanon Ace, LCSW

## 2019-06-10 ENCOUNTER — Other Ambulatory Visit: Payer: Self-pay | Admitting: Adult Health

## 2019-06-14 ENCOUNTER — Ambulatory Visit: Payer: Medicare Other | Admitting: Psychiatry

## 2019-06-15 DIAGNOSIS — H16213 Exposure keratoconjunctivitis, bilateral: Secondary | ICD-10-CM | POA: Diagnosis not present

## 2019-06-15 DIAGNOSIS — H2513 Age-related nuclear cataract, bilateral: Secondary | ICD-10-CM | POA: Diagnosis not present

## 2019-06-15 DIAGNOSIS — H04123 Dry eye syndrome of bilateral lacrimal glands: Secondary | ICD-10-CM | POA: Diagnosis not present

## 2019-06-20 ENCOUNTER — Other Ambulatory Visit: Payer: Self-pay

## 2019-06-20 ENCOUNTER — Ambulatory Visit (INDEPENDENT_AMBULATORY_CARE_PROVIDER_SITE_OTHER): Payer: Medicare Other | Admitting: Psychiatry

## 2019-06-20 DIAGNOSIS — F331 Major depressive disorder, recurrent, moderate: Secondary | ICD-10-CM

## 2019-06-20 NOTE — Progress Notes (Signed)
      [facility] Counselor/Therapist Progress Note  Patient ID: Cody Hines., MRN: [id number],    Date: 06/20/2019  Time Spent:  60 minutes   8:00am to 9:00am   Treatment Type: Individual Therapy  Reported Symptoms:  Anxiety, depression (improved some); reports no new meds nor health concerns since last visit   Mental Status Exam:  Appearance:   Casual     Behavior:  Appropriate and Sharing  Motor:  Normal  Speech/Language:   Normal Rate  Affect:  Anxious (and anxiety has decreased some)  Mood:  Some anxiety and frustration  Thought process:  normal  Thought content:    WNL  Sensory/Perceptual disturbances:    WNL  Orientation:  oriented to person, place, time/date, situation, day of week, month of year and year  Attention:  Good  Concentration:  Good  Memory:  reports "some forgetfulness mostly in misplacing things at home".  Fund of knowledge:   Good  Insight:    Good  Judgment:   Good  Impulse Control:  Good   Risk Assessment: Danger to Self:  No Self-injurious Behavior: No Danger to Others: No Duty to Warn:no Physical Aggression / Violence:No  Access to Firearms a concern: No  Gang Involvement:No   Subjective: Patient in today reporting some continued depression (although better), along with some anxiety.  Has had some family interactions since last appt 2 weeks ago.  Some progress noted with him.  A birthday event coming up for grandson and patient was talking about what may go wrong at the event instead of what may go well.  Did encourage him to shift his mindset some and be looking for what may go well. Outlook is not as negative however still easy for him to slip back into negative thought patterns.    Interventions: Cognitive Behavioral Therapy and Solution-Oriented/Positive Psychology  Diagnosis:   ICD-10-CM   1. Major depressive disorder, recurrent episode, moderate ([place])  F33.1     Plan:   Treatment Goals: Patient is not signing tx goals on  computer screen due to [place].  Long term goal: Develop healthy cognitive patterns and beliefs about self and the world that lead to alleviation and help prevent relapse of depression.  Short term goal: Identify and replace depressive thinking that leads to depressive feelings and actions.   Strategy: Patient to more consistently monitor his thoughts and catch the depressive/negative thoughts, trying to change them to positive. Look at his negative thoughts and weigh them against the evidence.  Progressiing: Patient in agreement with tx goals and is progressing. Has acknowledged that his difficulty with depression and negatively have gone on a long time and it is challenging to change.  Next appt within 1-2 wks.  Shanon Ace, LCSW

## 2019-06-22 ENCOUNTER — Emergency Department (HOSPITAL_COMMUNITY)
Admission: EM | Admit: 2019-06-22 | Discharge: 2019-06-23 | Disposition: A | Discharge status: Home / Self Care | Payer: Medicare Other | Attending: Emergency Medicine | Admitting: Emergency Medicine

## 2019-06-22 ENCOUNTER — Emergency Department (HOSPITAL_COMMUNITY): Payer: Medicare Other

## 2019-06-22 DIAGNOSIS — Y929 Unspecified place or not applicable: Secondary | ICD-10-CM | POA: Diagnosis not present

## 2019-06-22 DIAGNOSIS — Z87891 Personal history of nicotine dependence: Secondary | ICD-10-CM | POA: Diagnosis not present

## 2019-06-22 DIAGNOSIS — I129 Hypertensive chronic kidney disease with stage 1 through stage 4 chronic kidney disease, or unspecified chronic kidney disease: Secondary | ICD-10-CM | POA: Diagnosis not present

## 2019-06-22 DIAGNOSIS — Y939 Activity, unspecified: Secondary | ICD-10-CM | POA: Diagnosis not present

## 2019-06-22 DIAGNOSIS — S199XXA Unspecified injury of neck, initial encounter: Secondary | ICD-10-CM | POA: Diagnosis not present

## 2019-06-22 DIAGNOSIS — S299XXA Unspecified injury of thorax, initial encounter: Secondary | ICD-10-CM | POA: Diagnosis not present

## 2019-06-22 DIAGNOSIS — M4854XA Collapsed vertebra, not elsewhere classified, thoracic region, initial encounter for fracture: Secondary | ICD-10-CM | POA: Insufficient documentation

## 2019-06-22 DIAGNOSIS — W11XXXA Fall on and from ladder, initial encounter: Secondary | ICD-10-CM | POA: Insufficient documentation

## 2019-06-22 DIAGNOSIS — Z7982 Long term (current) use of aspirin: Secondary | ICD-10-CM | POA: Insufficient documentation

## 2019-06-22 DIAGNOSIS — M542 Cervicalgia: Secondary | ICD-10-CM | POA: Diagnosis not present

## 2019-06-22 DIAGNOSIS — N183 Chronic kidney disease, stage 3 (moderate): Secondary | ICD-10-CM | POA: Diagnosis not present

## 2019-06-22 DIAGNOSIS — S3992XA Unspecified injury of lower back, initial encounter: Secondary | ICD-10-CM | POA: Diagnosis not present

## 2019-06-22 DIAGNOSIS — S3993XA Unspecified injury of pelvis, initial encounter: Secondary | ICD-10-CM | POA: Diagnosis not present

## 2019-06-22 DIAGNOSIS — S22088A Other fracture of T11-T12 vertebra, initial encounter for closed fracture: Secondary | ICD-10-CM | POA: Diagnosis not present

## 2019-06-22 DIAGNOSIS — S0990XA Unspecified injury of head, initial encounter: Secondary | ICD-10-CM | POA: Diagnosis not present

## 2019-06-22 DIAGNOSIS — Z79899 Other long term (current) drug therapy: Secondary | ICD-10-CM | POA: Diagnosis not present

## 2019-06-22 DIAGNOSIS — R110 Nausea: Secondary | ICD-10-CM | POA: Insufficient documentation

## 2019-06-22 DIAGNOSIS — T1490XA Injury, unspecified, initial encounter: Secondary | ICD-10-CM

## 2019-06-22 DIAGNOSIS — S3991XA Unspecified injury of abdomen, initial encounter: Secondary | ICD-10-CM | POA: Diagnosis not present

## 2019-06-22 DIAGNOSIS — Y999 Unspecified external cause status: Secondary | ICD-10-CM | POA: Diagnosis not present

## 2019-06-22 DIAGNOSIS — R001 Bradycardia, unspecified: Secondary | ICD-10-CM | POA: Diagnosis not present

## 2019-06-22 LAB — CBC WITH DIFFERENTIAL/PLATELET
Abs Immature Granulocytes: 0.05 10*3/uL (ref 0.00–0.07)
Basophils Absolute: 0 10*3/uL (ref 0.0–0.1)
Basophils Relative: 0 %
Eosinophils Absolute: 0 10*3/uL (ref 0.0–0.5)
Eosinophils Relative: 0 %
HCT: 42 % (ref 39.0–52.0)
Hemoglobin: 14.7 g/dL (ref 13.0–17.0)
Immature Granulocytes: 1 %
Lymphocytes Relative: 11 %
Lymphs Abs: 0.9 10*3/uL (ref 0.7–4.0)
MCH: 31.1 pg (ref 26.0–34.0)
MCHC: 35 g/dL (ref 30.0–36.0)
MCV: 89 fL (ref 80.0–100.0)
Monocytes Absolute: 0.3 10*3/uL (ref 0.1–1.0)
Monocytes Relative: 4 %
Neutro Abs: 6.8 10*3/uL (ref 1.7–7.7)
Neutrophils Relative %: 84 %
Platelets: 187 10*3/uL (ref 150–400)
RBC: 4.72 MIL/uL (ref 4.22–5.81)
RDW: 13.8 % (ref 11.5–15.5)
WBC: 8.1 10*3/uL (ref 4.0–10.5)
nRBC: 0 % (ref 0.0–0.2)

## 2019-06-22 LAB — SAMPLE TO BLOOD BANK

## 2019-06-22 LAB — LACTIC ACID, PLASMA: Lactic Acid, Venous: 1.8 mmol/L (ref 0.5–1.9)

## 2019-06-22 LAB — COMPREHENSIVE METABOLIC PANEL
ALT: 23 U/L (ref 0–44)
AST: 35 U/L (ref 15–41)
Albumin: 3.9 g/dL (ref 3.5–5.0)
Alkaline Phosphatase: 93 U/L (ref 38–126)
Anion gap: 11 (ref 5–15)
BUN: 8 mg/dL (ref 8–23)
CO2: 22 mmol/L (ref 22–32)
Calcium: 9.6 mg/dL (ref 8.9–10.3)
Chloride: 105 mmol/L (ref 98–111)
Creatinine, Ser: 1.4 mg/dL — ABNORMAL HIGH (ref 0.61–1.24)
GFR calc Af Amer: 59 mL/min — ABNORMAL LOW (ref >60)
GFR calc non Af Amer: 51 mL/min — ABNORMAL LOW (ref >60)
Glucose, Bld: 154 mg/dL — ABNORMAL HIGH (ref 70–99)
Potassium: 4.3 mmol/L (ref 3.5–5.1)
Sodium: 138 mmol/L (ref 135–145)
Total Bilirubin: 0.9 mg/dL (ref 0.3–1.2)
Total Protein: 7.3 g/dL (ref 6.5–8.1)

## 2019-06-22 MED ORDER — OXYCODONE-ACETAMINOPHEN 5-325 MG PO TABS
1.0000 | ORAL_TABLET | ORAL | Status: DC | PRN
  Administered 2019-06-22: 1 via ORAL
  Filled 2019-06-22: qty 1

## 2019-06-22 MED ORDER — ONDANSETRON 4 MG PO TBDP
4.0000 mg | ORAL_TABLET | Freq: Once | ORAL | Status: AC
  Administered 2019-06-22: 4 mg via ORAL
  Filled 2019-06-22: qty 1

## 2019-06-22 MED ORDER — IOHEXOL 300 MG/ML  SOLN
100.0000 mL | Freq: Once | INTRAMUSCULAR | Status: AC | PRN
  Administered 2019-06-22: 23:00:00 100 mL via INTRAVENOUS

## 2019-06-22 MED ORDER — FENTANYL CITRATE (PF) 100 MCG/2ML IJ SOLN
50.0000 ug | Freq: Once | INTRAMUSCULAR | Status: AC
  Administered 2019-06-22: 22:00:00 50 ug via INTRAVENOUS
  Filled 2019-06-22: qty 2

## 2019-06-22 NOTE — Discharge Instructions (Addendum)
Take the prescribed medication as directed. Follow-up with neurosurgery--- call in the morning to schedule appt. Return to the ED for new or worsening symptoms.

## 2019-06-22 NOTE — ED Triage Notes (Signed)
Pt presents with lower back pain due to falling off a ladder 2 days ago. Pt reports a hx of a "ruptured disc"

## 2019-06-22 NOTE — ED Notes (Signed)
Patient transported to CT 

## 2019-06-22 NOTE — ED Provider Notes (Signed)
[facility] EMERGENCY DEPARTMENT Provider Note   CSN: [id number] Arrival date & time: 06/22/19  1709     History   Chief Complaint Chief Complaint  Patient presents with   Fall    HPI Cody Hines. is a 73 y.o. male with past medical history of GERD, fibromyalgia, hypertension, hyperlipidemia, CKD, who presents today for evaluation of a fall.  He reports that 2 days ago he was approximately 8 to 10 feet up on a ladder when he fell.  He denies passing out however is unsure why he fell.  He reports that he landed on his back.  He reports that since then he has had diffuse and continued pain in his mid lower back, thoracic back, neck.  He reports nausea however denies vomiting.  He denies any syncope states.  He has been able to get up and walk around without weakness.     HPI  Past Medical History:  Diagnosis Date   BPH (benign prostatic hyperplasia)    Cancer (HCC)    nose, skin cancer   Elevated hemoglobin A1c    Fibromyalgia    GERD (gastroesophageal reflux disease)    Hyperlipidemia    Hypertension    Hypogonadism male    IBS (irritable bowel syndrome)    OSA (obstructive sleep apnea)     Patient Active Problem List   Diagnosis Date Noted   CKD (chronic kidney disease) stage 3, GFR 30-59 ml/min ([place]) 03/23/2019   Major depressive disorder, recurrent episode, moderate ([place]) 07/19/2018   Chronic lumbar pain 03/12/2018   Benign essential tremor 12/19/2016   Encounter for Medicare annual wellness exam 08/10/2015   Obesity (BMI 30.0-34.9) 08/10/2015   Medication management 05/03/2014   Vitamin D deficiency 05/03/2014   Hypertension    Hyperlipidemia    GERD (gastroesophageal reflux disease)    Other abnormal glucose (prediabetes)    IBS (irritable bowel syndrome)    OSA on CPAP    Fibromyalgia    BPH (benign prostatic hyperplasia)    Testosterone deficiency     Past Surgical History:  Procedure Laterality  Date    nasal smr np3  1985   KNEE ARTHROSCOPY Left 1999   SKIN CANCER EXCISION  2020   nose   SPINE SURGERY  2007   L5 S 1 Disk   VASECTOMY  1983        Home Medications    Prior to Admission medications   Medication Sig Start Date End Date Taking? Authorizing Provider  acetaminophen (TYLENOL) 650 MG CR tablet Take 650 mg by mouth every 8 (eight) hours as needed for pain (or back pain or migraines).    Yes [provider]  aspirin EC 81 MG tablet Take 81 mg by mouth daily.   Yes [provider]  Cholecalciferol (VITAMIN D3) 250 MCG (10000 UT) capsule Take 10,000 Units by mouth daily.   Yes [provider]  enalapril (VASOTEC) 20 MG tablet Take 1 tablet Daily for BP Patient taking differently: Take 20 mg by mouth daily.  06/10/19  Yes [name], MD  Flaxseed, Linseed, (FLAX SEED OIL) 1000 MG CAPS Take 2,000 mg by mouth daily.    Yes [provider]  LORazepam (ATIVAN) 1 MG tablet Take 1 tablet (1 mg total) by mouth at bedtime. Patient taking differently: Take 1 mg by mouth at bedtime as needed for anxiety or sleep.  08/24/18  Yes Cottle, Billey Co., MD  Magnesium 500 MG TABS Take  500 mg by mouth daily.    Yes [provider]  rosuvastatin (CRESTOR) 40 MG tablet TAKE 1 TABLET BY MOUTH  DAILY FOR CHOLESTEROL Patient taking differently: Take 40 mg by mouth daily.  11/05/18  Yes Liane Comber, NP  sertraline (ZOLOFT) 100 MG tablet Take 2 tablets (200 mg total) by mouth daily. 08/24/18  Yes Cottle, Billey Co., MD    Family History Family History  Problem Relation Age of Onset   Cancer Mother        breast   Cancer Father        lung   Diabetes Father    Heart disease Sister    Arthritis Sister     Social History Social History   Tobacco Use   Smoking status: Former Smoker    Years: 30.00    Types: Cigarettes    Quit date: 10/06/1980    Years since quitting: 38.7   Smokeless tobacco: Never Used  Substance  Use Topics   Alcohol use: No    Frequency: Never    Comment: Not drinking x 1 month   Drug use: No     Allergies   Asa [aspirin], Atorvastatin, Celebrex [celecoxib], Codeine, Savella [milnacipran hcl], Viagra [sildenafil citrate], and Penicillins   Review of Systems Review of Systems  Constitutional: Negative for activity change and fever.  HENT: Negative for congestion.   Respiratory: Negative for chest tightness and shortness of breath.   Cardiovascular: Negative for chest pain.  Gastrointestinal: Positive for abdominal pain and nausea. Negative for diarrhea and vomiting.  Genitourinary: Negative for dysuria and urgency.  Musculoskeletal: Negative for back pain and neck pain.  Neurological: Negative for weakness and headaches.  Psychiatric/Behavioral: Negative for confusion.  All other systems reviewed and are negative.    Physical Exam Updated Vital Signs BP (!) 152/58 (BP Location: Left Arm)    Pulse (!) 47    Temp 97.7 F (36.5 C) (Oral)    Resp 20    SpO2 97%   Physical Exam Vitals signs and nursing note reviewed.  Constitutional:      Appearance: He is well-developed.     Comments: Appears uncomfortable  HENT:     Head: Normocephalic and atraumatic.     Mouth/Throat:     Mouth: Mucous membranes are moist.  Eyes:     Conjunctiva/sclera: Conjunctivae normal.  Neck:     Comments: Diffuse midline TTP.  ROM not tested secondary to pain.  Cardiovascular:     Rate and Rhythm: Normal rate and regular rhythm.     Pulses: Normal pulses.     Heart sounds: No murmur.  Pulmonary:     Effort: Pulmonary effort is normal. No respiratory distress.     Breath sounds: Normal breath sounds.  Abdominal:     Palpations: Abdomen is soft.     Tenderness: There is abdominal tenderness (Left lower abd/lateral abd).     Hernia: No hernia is present.  Skin:    General: Skin is warm and dry.  Neurological:     General: No focal deficit present.     Mental Status: He is alert  and oriented to person, place, and time.     Motor: No weakness.  Psychiatric:        Mood and Affect: Mood normal.        Behavior: Behavior normal.      ED Treatments / Results  Labs (all labs ordered are listed, but only abnormal results are displayed) Labs Reviewed  COMPREHENSIVE METABOLIC PANEL - Abnormal; Notable for the following components:      Result Value   Glucose, Bld 154 (*)    Creatinine, Ser 1.40 (*)    GFR calc non Af Amer 51 (*)    GFR calc Af Amer 59 (*)    All other components within normal limits  CBC WITH DIFFERENTIAL/PLATELET  LACTIC ACID, PLASMA  URINALYSIS, ROUTINE W REFLEX MICROSCOPIC  SAMPLE TO BLOOD BANK    EKG None  Radiology Ct Head Wo Contrast  Result Date: 06/22/2019 CLINICAL DATA:  73 year old male status post 8 ft fall from ladder today. EXAM: CT HEAD WITHOUT CONTRAST CT CERVICAL SPINE WITHOUT CONTRAST TECHNIQUE: Multidetector CT imaging of the head and cervical spine was performed following the standard protocol without intravenous contrast. Multiplanar CT image reconstructions of the cervical spine were also generated. COMPARISON:  Head face and cervical spine CT 11/30/2007. FINDINGS: CT HEAD FINDINGS Brain: Stable cerebral volume since 2009. No midline shift, ventriculomegaly, mass effect, evidence of mass lesion, intracranial hemorrhage or evidence of cortically based acute infarction. Gray-white matter differentiation is within normal limits throughout the brain. Vascular: Mild Calcified atherosclerosis at the skull base. No suspicious intracranial vascular hyperdensity. Skull: Intact. Sinuses/Orbits: Visualized paranasal sinuses and mastoids are stable and well pneumatized. Other: No acute orbit or scalp soft tissue findings. CT CERVICAL SPINE FINDINGS Alignment: Similar cervical lordosis to the 2009 study. Cervicothoracic junction alignment is within normal limits. Bilateral posterior element alignment is within normal limits. Skull base and  vertebrae: Visualized skull base is intact. No atlanto-occipital dissociation. 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 No acute osseous abnormality identified. Stable benign left C4 bone island. Soft tissues and spinal canal: No prevertebral fluid or swelling. No visible canal hematoma. Calcified cervical carotid atherosclerosis. Disc levels: Mild for age cervical spine degeneration appears stable since 2009. There is partially calcified ligament flavum hypertrophy at C7-T1. Upper chest: Visible upper thoracic levels appear intact. Chronic apically lung scarring appears stable. IMPRESSION: 1. No acute traumatic injury identified in the head or cervical spine. 2. Stable and normal for age non contrast CT appearance of the brain. Electronically Signed   By: Genevie Ann M.D.   On: 06/22/2019 23:42   Ct Cervical Spine Wo Contrast  Result Date: 06/22/2019 CLINICAL DATA:  73 year old male status post 8 ft fall from ladder today. EXAM: CT HEAD WITHOUT CONTRAST CT CERVICAL SPINE WITHOUT CONTRAST TECHNIQUE: Multidetector CT imaging of the head and cervical spine was performed following the standard protocol without intravenous contrast. Multiplanar CT image reconstructions of the cervical spine were also generated. COMPARISON:  Head face and cervical spine CT 11/30/2007. FINDINGS: CT HEAD FINDINGS Brain: Stable cerebral volume since 2009. No midline shift, ventriculomegaly, mass effect, evidence of mass lesion, intracranial hemorrhage or evidence of cortically based acute infarction. Gray-white matter differentiation is within normal limits throughout the brain. Vascular: Mild Calcified atherosclerosis at the skull base. No suspicious intracranial vascular hyperdensity. Skull: Intact. Sinuses/Orbits: Visualized paranasal sinuses and mastoids are stable and well pneumatized. Other: No acute orbit or  scalp soft tissue findings. CT CERVICAL SPINE FINDINGS Alignment: Similar cervical lordosis to the 2009 study. Cervicothoracic junction alignment is within normal limits. Bilateral posterior element alignment is within normal limits. Skull base and vertebrae: Visualized skull base is intact. No atlanto-occipital dissociation. Stable C2 vertebra, possible remote healed central C2 fracture is unchanged, or alternatively there may be chronic ossification  of the posterior longitudinal ligament. No acute osseous abnormality identified. Stable benign left C4 bone island. Soft tissues and spinal canal: No prevertebral fluid or swelling. No visible canal hematoma. Calcified cervical carotid atherosclerosis. Disc levels: Mild for age cervical spine degeneration appears stable since 2009. There is partially calcified ligament flavum hypertrophy at C7-T1. Upper chest: Visible upper thoracic levels appear intact. Chronic apically lung scarring appears stable. IMPRESSION: 1. No acute traumatic injury identified in the head or cervical spine. 2. Stable and normal for age non contrast CT appearance of the brain. Electronically Signed   By: Genevie Ann M.D.   On: 06/22/2019 23:42   Ct Lumbar Spine Wo Contrast  Result Date: 06/22/2019 CLINICAL DATA:  L-spine, 8 foot fall EXAM: CT LUMBAR SPINE WITHOUT CONTRAST TECHNIQUE: Multidetector CT imaging of the lumbar spine was performed without intravenous contrast administration. Multiplanar CT image reconstructions were also generated. COMPARISON:  CT abdomen 09/28/2008 FINDINGS: Segmentation: 5 lumbar type vertebrae. Alignment: Preservation of the normal lumbar lordosis. No traumatic listhesis. Vertebrae: Anterior wedge-compression deformity of the T12 vertebral body with approximately 35% anterior height loss. No discernible extension into the posterior elements. No retropulsion. Small amount of sclerosis in the inferior endplate of the adjacent T12 vertebrae may reflect the Schmorl's  node formation as additional Schmorl's nodes are noted at several other levels. No other discernible fracture. Lucency through the right lamina L1 and similarly of the left lamina L2 are favored to reflect prominent vascular channels. Paraspinal and other soft tissues: Trace hemorrhage adjacent the compression deformity at T12. No other paraspinal soft tissue abnormality. Atherosclerotic calcification is noted in the aorta. Small amount of hazy mesenteric stranding, incompletely assessed. Disc levels: Level by level evaluation of the lumbar spine below: T11-T12: T12 compression deformity as above. No retropulsed fracture fragments. No disc abnormality significant spinal canal or foraminal stenosis. T12-L1: Small global disc bulge No significant spinal canal or foraminal stenosis. L1-L2: Small global disc bulge No significant spinal canal or foraminal stenosis. L2-L3: Mild global disc bulge, mild facet degenerative change. No canal stenosis. Mild bilateral foraminal narrowing. L3-L4: Mild global disc bulge. Mild facet degenerative change. Mild bilateral foraminal narrowing. No canal stenosis. L4-L5: Moderate global disc bulge with central disc protrusion. Mild facet degenerative change with some ligamentum flavum infolding and synovial thickening at both facets. Finding results in moderate to severe canal stenosis and moderate bilateral foraminal narrowing. L5-S1: Disc height loss with global disc bulge. Mild facet degenerative change. No significant spinal canal stenosis. Mild foraminal narrowing bilaterally. IMPRESSION: 1. Anterior wedge-compression deformity of the T12 vertebral body with approximately 35% anterior height loss, without discernible extension into the posterior elements. No retropulsion. Findings compatible with an AOSpine A1 type injury. 2. Lucency through the right lamina L1 and similarly of the left lamina L2 are favored to reflect prominent vascular channels. 3. Multilevel degenerative changes  of the lumbar spine as described level by level above, maximal at L4-5 with moderate to severe canal stenosis and moderate bilateral foraminal narrowing. 4. Small amount of nonspecific hazy posterior mesenteric stranding and possible thickening of cecum. Incompletely evaluated given collimation and lack of contrast material. Recommend clinical assessment in the setting of trauma and consideration contrast enhanced cross-sectional imaging. 5. Aortic Atherosclerosis (ICD10-I70.0). These results were called by telephone at the time of interpretation on 06/22/2019 at 8:50 pm to provider [facility] , who verbally acknowledged these results. Electronically Signed   By: Lovena Le M.D.   On: 06/22/2019 20:50    Procedures Procedures (  including critical care time)  Medications Ordered in ED Medications  oxyCODONE-acetaminophen (PERCOCET/ROXICET) 5-325 MG per tablet 1 tablet (1 tablet Oral Given 06/22/19 1903)  ondansetron (ZOFRAN-ODT) disintegrating tablet 4 mg (4 mg Oral Given 06/22/19 1903)  fentaNYL (SUBLIMAZE) injection 50 mcg (50 mcg Intravenous Given 06/22/19 2139)  iohexol (OMNIPAQUE) 300 MG/ML solution 100 mL (100 mLs Intravenous Contrast Given 06/22/19 2255)     Initial Impression / Assessment and Plan / ED Course  I have reviewed the triage vital signs and the nursing notes.  Pertinent labs & imaging results that were available during my care of the patient were reviewed by me and considered in my medical decision making (see chart for details).       Patient presents today for evaluation of back pain after he fell off a ladder 2 days ago.  On exam he has diffuse tenderness of his back including his neck.  He is unsure if he hit his head however notes that his glasses "flew off."  CT L-spine was obtained in triage showing concern for a T12 fracture with concern for abdominal stranding.  His pain was treated with fentanyl.  CMP shows a creatinine of 1.40.  CBC is unremarkable.    CT  head and neck show no evidence of acute traumatic abnormalities.  CT abdomen pelvis with contrast ordered.  UA is still pending at this time.  At shift change care was transferred to Quincy Carnes PA-C who will follow pending studies, re-evaulate and determine disposition.      Final Clinical Impressions(s) / ED Diagnoses   Final diagnoses:  Fall from ladder, initial encounter  Closed fracture of twelfth thoracic vertebra, unspecified fracture morphology, initial encounter [facility])    ED Discharge Orders    None       Ollen Gross 06/22/19 2351    Maudie Flakes, MD 06/25/19 0700

## 2019-06-23 LAB — URINALYSIS, ROUTINE W REFLEX MICROSCOPIC
Bilirubin Urine: NEGATIVE
Glucose, UA: NEGATIVE mg/dL
Hgb urine dipstick: NEGATIVE
Ketones, ur: NEGATIVE mg/dL
Leukocytes,Ua: NEGATIVE
Nitrite: NEGATIVE
Protein, ur: NEGATIVE mg/dL
Specific Gravity, Urine: 1.033 — ABNORMAL HIGH (ref 1.005–1.030)
pH: 5 (ref 5.0–8.0)

## 2019-06-23 MED ORDER — ONDANSETRON 4 MG PO TBDP: 4.0000 mg | ORAL_TABLET | Freq: Three times a day (TID) | ORAL | 0 refills | Status: DC | PRN

## 2019-06-23 MED ORDER — OXYCODONE-ACETAMINOPHEN 5-325 MG PO TABS
1.0000 | ORAL_TABLET | Freq: Once | ORAL | Status: AC
  Administered 2019-06-23: 01:00:00 1 via ORAL
  Filled 2019-06-23: qty 1

## 2019-06-23 MED ORDER — OXYCODONE-ACETAMINOPHEN 5-325 MG PO TABS: 1.0000 | ORAL_TABLET | ORAL | 0 refills | Status: DC | PRN

## 2019-06-23 NOTE — ED Notes (Signed)
Ortho to page for  TLSO brace

## 2019-06-23 NOTE — Progress Notes (Signed)
Orthopedic Tech Progress Note Patient Details:  Cody Hines [birth year] 73 [id number] [facility] for TLSO brace. Patient ID: [name], male   DOB: [birth year], 73 y.o.   MRN: [id number]   [name] 06/23/2019, 2:49 AM

## 2019-06-23 NOTE — ED Notes (Signed)
Pt waiting on a back brace

## 2019-06-23 NOTE — Progress Notes (Signed)
  NEUROSURGERY PROGRESS NOTE   Late entry Received call from PA [place], [place], regarding patient 73 year old male who fell off ladder.  Found to have a T12 anterior wedge compression fracture with approximately 35% height loss.  No extension into posterior elements.   No retropulsion. Neurologically intact by report.  Pain well controlled with p.o. medication. No indication for neurosurgical intervention at present.  Patient to be placed in vertalign TLSO brace. Will need to follow up with Dr. Sherwood Gambler in several weeks for x-rays for monitoring.   Ferne Reus, PA-C [facility] [employer]

## 2019-06-23 NOTE — ED Notes (Signed)
Man arrived with a back brace

## 2019-06-23 NOTE — ED Provider Notes (Signed)
Results for orders placed or performed during the hospital encounter of 06/22/19  CBC with Differential/Platelet  Result Value Ref Range   WBC 8.1 4.0 - 10.5 K/uL   RBC 4.72 4.22 - 5.81 MIL/uL   Hemoglobin 14.7 13.0 - 17.0 g/dL   HCT 42.0 39.0 - 52.0 %   MCV 89.0 80.0 - 100.0 fL   MCH 31.1 26.0 - 34.0 pg   MCHC 35.0 30.0 - 36.0 g/dL   RDW 13.8 11.5 - 15.5 %   Platelets 187 150 - 400 K/uL   nRBC 0.0 0.0 - 0.2 %   Neutrophils Relative % 84 %   Neutro Abs 6.8 1.7 - 7.7 K/uL   Lymphocytes Relative 11 %   Lymphs Abs 0.9 0.7 - 4.0 K/uL   Monocytes Relative 4 %   Monocytes Absolute 0.3 0.1 - 1.0 K/uL   Eosinophils Relative 0 %   Eosinophils Absolute 0.0 0.0 - 0.5 K/uL   Basophils Relative 0 %   Basophils Absolute 0.0 0.0 - 0.1 K/uL   Immature Granulocytes 1 %   Abs Immature Granulocytes 0.05 0.00 - 0.07 K/uL  Comprehensive metabolic panel  Result Value Ref Range   Sodium 138 135 - 145 mmol/L   Potassium 4.3 3.5 - 5.1 mmol/L   Chloride 105 98 - 111 mmol/L   CO2 22 22 - 32 mmol/L   Glucose, Bld 154 (H) 70 - 99 mg/dL   BUN 8 8 - 23 mg/dL   Creatinine, Ser 1.40 (H) 0.61 - 1.24 mg/dL   Calcium 9.6 8.9 - 10.3 mg/dL   Total Protein 7.3 6.5 - 8.1 g/dL   Albumin 3.9 3.5 - 5.0 g/dL   AST 35 15 - 41 U/L   ALT 23 0 - 44 U/L   Alkaline Phosphatase 93 38 - 126 U/L   Total Bilirubin 0.9 0.3 - 1.2 mg/dL   GFR calc non Af Amer 51 (L) >60 mL/min   GFR calc Af Amer 59 (L) >60 mL/min   Anion gap 11 5 - 15  Lactic acid, plasma  Result Value Ref Range   Lactic Acid, Venous 1.8 0.5 - 1.9 mmol/L  Urinalysis, Routine w reflex microscopic  Result Value Ref Range   Color, Urine YELLOW YELLOW   APPearance CLEAR CLEAR   Specific Gravity, Urine 1.033 (H) 1.005 - 1.030   pH 5.0 5.0 - 8.0   Glucose, UA NEGATIVE NEGATIVE mg/dL   Hgb urine dipstick NEGATIVE NEGATIVE   Bilirubin Urine NEGATIVE NEGATIVE   Ketones, ur NEGATIVE NEGATIVE mg/dL   Protein, ur NEGATIVE NEGATIVE mg/dL   Nitrite NEGATIVE  NEGATIVE   Leukocytes,Ua NEGATIVE NEGATIVE  Sample to Blood Bank  Result Value Ref Range   Blood Bank Specimen SAMPLE AVAILABLE FOR TESTING    Sample Expiration      06/23/2019,2359 Performed at [facility], [address] [place], [place] [address]    Ct Head Wo Contrast  Result Date: 06/22/2019 CLINICAL DATA:  73 year old male status post 8 ft fall from ladder today. EXAM: CT HEAD WITHOUT CONTRAST CT CERVICAL SPINE WITHOUT CONTRAST TECHNIQUE: Multidetector CT imaging of the head and cervical spine was performed following the standard protocol without intravenous contrast. Multiplanar CT image reconstructions of the cervical spine were also generated. COMPARISON:  Head face and cervical spine CT 11/30/2007. FINDINGS: CT HEAD FINDINGS Brain: Stable cerebral volume since 2009. No midline shift, ventriculomegaly, mass effect, evidence of mass lesion, intracranial hemorrhage or evidence of cortically based acute infarction. Gray-white matter  differentiation is within normal limits throughout the brain. Vascular: Mild Calcified atherosclerosis at the skull base. No suspicious intracranial vascular hyperdensity. Skull: Intact. Sinuses/Orbits: Visualized paranasal sinuses and mastoids are stable and well pneumatized. Other: No acute orbit or scalp soft tissue findings. CT CERVICAL SPINE FINDINGS Alignment: Similar cervical lordosis to the 2009 study. Cervicothoracic junction alignment is within normal limits. Bilateral posterior element alignment is within normal limits. Skull base and vertebrae: Visualized skull base is intact. No atlanto-occipital dissociation. 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 No acute osseous abnormality identified. Stable benign left C4 bone island. Soft tissues and spinal canal: No prevertebral fluid or swelling. No visible canal hematoma.  Calcified cervical carotid atherosclerosis. Disc levels: Mild for age cervical spine degeneration appears stable since 2009. There is partially calcified ligament flavum hypertrophy at C7-T1. Upper chest: Visible upper thoracic levels appear intact. Chronic apically lung scarring appears stable. IMPRESSION: 1. No acute traumatic injury identified in the head or cervical spine. 2. Stable and normal for age non contrast CT appearance of the brain. Electronically Signed   By: Genevie Ann M.D.   On: 06/22/2019 23:42   Ct Chest W Contrast  Result Date: 06/22/2019 CLINICAL DATA:  Fall from ladder 2 days ago known T12 fracture EXAM: CT CHEST, ABDOMEN, AND PELVIS WITH CONTRAST TECHNIQUE: Multidetector CT imaging of the chest, abdomen and pelvis was performed following the standard protocol during bolus administration of intravenous contrast. CONTRAST:  157mL OMNIPAQUE IOHEXOL 300 MG/ML  SOLN COMPARISON:  None. FINDINGS: CT CHEST FINDINGS Cardiovascular: Mild atherosclerotic changes of the thoracic aorta are noted. No aortic dissection or aneurysmal dilatation is seen. No aortic hematoma is noted. Heart is at the upper limits of normal in size. No significant coronary calcifications are seen. The pulmonary artery as visualized is within normal limits although not timed for PE evaluation. Mediastinum/Nodes: Thoracic inlet is within normal limits. No hilar or mediastinal adenopathy is noted. The esophagus is within normal limits. Lungs/Pleura: Lungs are well aerated bilaterally. No focal infiltrate or sizable effusion is seen. No pneumothorax is noted. Musculoskeletal: T12 compression deformity is again identified. No other focal bony abnormality in the thorax is noted. CT ABDOMEN PELVIS FINDINGS Hepatobiliary: Gallbladder is well distended. The liver shows no focal mass lesion. Pancreas: Unremarkable. No pancreatic ductal dilatation or surrounding inflammatory changes. Spleen: Normal in size without focal abnormality.  Adrenals/Urinary Tract: The adrenal glands are within normal limits. 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 Left renal artery is within normal limits. Left kidney demonstrates a normal enhancement pattern. Delayed images demonstrate normal excretion of contrast material. 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 The bladder is partially distended. Stomach/Bowel: The appendix is within normal limits. Colon is well visualized and within normal limits. No obstructive or inflammatory small bowel changes are seen. Vascular/Lymphatic: Aortic atherosclerosis. No enlarged abdominal or pelvic lymph nodes. Reproductive: Prostate is unremarkable. Other: No free fluid is noted within the pelvis. Some mild stranding is noted within the mesentery likely related to the recent trauma although no sizable hematoma is seen. Small fat containing inguinal hernia on the left is noted. Musculoskeletal: No acute bony abnormality is noted. Degenerative changes of the lumbar spine are seen. IMPRESSION: T12 compression deformity similar to that seen on prior CT of the thoracic spine. 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 Mild stranding within the mesenteric fat likely related to the recent blunt trauma. No sizable hematoma is seen. Electronically Signed   By: Inez Catalina M.D.   On: 06/22/2019 23:53   Ct Cervical Spine Wo Contrast  Result Date: 06/22/2019 CLINICAL DATA:  73 year old male status post 8 ft fall from ladder today. EXAM: CT HEAD WITHOUT CONTRAST CT CERVICAL SPINE WITHOUT CONTRAST TECHNIQUE: Multidetector CT imaging of the head and cervical spine was performed following the standard protocol without intravenous contrast. Multiplanar CT image reconstructions of the cervical spine were also generated. COMPARISON:  Head face and cervical spine CT 11/30/2007. FINDINGS: CT HEAD FINDINGS Brain: Stable cerebral volume  since 2009. No midline shift, ventriculomegaly, mass effect, evidence of mass lesion, intracranial hemorrhage or evidence of cortically based acute infarction. Gray-white matter differentiation is within normal limits throughout the brain. Vascular: Mild Calcified atherosclerosis at the skull base. No suspicious intracranial vascular hyperdensity. Skull: Intact. Sinuses/Orbits: Visualized paranasal sinuses and mastoids are stable and well pneumatized. Other: No acute orbit or scalp soft tissue findings. CT CERVICAL SPINE FINDINGS Alignment: Similar cervical lordosis to the 2009 study. Cervicothoracic junction alignment is within normal limits. Bilateral posterior element alignment is within normal limits. Skull base and vertebrae: Visualized skull base is intact. No atlanto-occipital dissociation. 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 No acute osseous abnormality identified. Stable benign left C4 bone island. Soft tissues and spinal canal: No prevertebral fluid or swelling. No visible canal hematoma. Calcified cervical carotid atherosclerosis. Disc levels: Mild for age cervical spine degeneration appears stable since 2009. There is partially calcified ligament flavum hypertrophy at C7-T1. Upper chest: Visible upper thoracic levels appear intact. Chronic apically lung scarring appears stable. IMPRESSION: 1. No acute traumatic injury identified in the head or cervical spine. 2. Stable and normal for age non contrast CT appearance of the brain. Electronically Signed   By: Genevie Ann M.D.   On: 06/22/2019 23:42   Ct Lumbar Spine Wo Contrast  Result Date: 06/22/2019 CLINICAL DATA:  L-spine, 8 foot fall EXAM: CT LUMBAR SPINE WITHOUT CONTRAST TECHNIQUE: Multidetector CT imaging of the lumbar spine was performed without intravenous contrast administration. Multiplanar CT image reconstructions  were also generated. COMPARISON:  CT abdomen 09/28/2008 FINDINGS: Segmentation: 5 lumbar type vertebrae. Alignment: Preservation of the normal lumbar lordosis. No traumatic listhesis. Vertebrae: Anterior wedge-compression deformity of the T12 vertebral body with approximately 35% anterior height loss. No discernible extension into the posterior elements. No retropulsion. Small amount of sclerosis in the inferior endplate of the adjacent T12 vertebrae may reflect the Schmorl's node formation as additional Schmorl's nodes are noted at several other levels. No other discernible fracture. Lucency through the right lamina L1 and similarly of the left lamina L2 are favored to reflect prominent vascular channels. Paraspinal and other soft tissues: Trace hemorrhage adjacent the compression deformity at T12. No other paraspinal soft tissue abnormality. Atherosclerotic calcification is noted in the aorta. Small amount of hazy mesenteric stranding, incompletely assessed. Disc levels: Level by level evaluation of the lumbar spine below: T11-T12: T12 compression deformity as above. No retropulsed fracture fragments. No disc abnormality significant spinal canal or foraminal stenosis. T12-L1: Small global disc bulge No significant spinal canal or foraminal stenosis. L1-L2: Small global disc bulge No significant spinal canal or foraminal stenosis. L2-L3: Mild global disc bulge, mild facet degenerative change. No canal stenosis. Mild bilateral foraminal narrowing. L3-L4: Mild global disc  bulge. Mild facet degenerative change. Mild bilateral foraminal narrowing. No canal stenosis. L4-L5: Moderate global disc bulge with central disc protrusion. Mild facet degenerative change with some ligamentum flavum infolding and synovial thickening at both facets. Finding results in moderate to severe canal stenosis and moderate bilateral foraminal narrowing. L5-S1: Disc height loss with global disc bulge. Mild facet degenerative change. No  significant spinal canal stenosis. Mild foraminal narrowing bilaterally. IMPRESSION: 1. Anterior wedge-compression deformity of the T12 vertebral body with approximately 35% anterior height loss, without discernible extension into the posterior elements. No retropulsion. Findings compatible with an AOSpine A1 type injury. 2. Lucency through the right lamina L1 and similarly of the left lamina L2 are favored to reflect prominent vascular channels. 3. Multilevel degenerative changes of the lumbar spine as described level by level above, maximal at L4-5 with moderate to severe canal stenosis and moderate bilateral foraminal narrowing. 4. Small amount of nonspecific hazy posterior mesenteric stranding and possible thickening of cecum. Incompletely evaluated given collimation and lack of contrast material. Recommend clinical assessment in the setting of trauma and consideration contrast enhanced cross-sectional imaging. 5. Aortic Atherosclerosis (ICD10-I70.0). These results were called by telephone at the time of interpretation on 06/22/2019 at 8:50 pm to provider [facility] , who verbally acknowledged these results. Electronically Signed   By: Lovena Le M.D.   On: 06/22/2019 20:50   Ct Abdomen Pelvis W Contrast  Result Date: 06/22/2019 CLINICAL DATA:  Fall from ladder 2 days ago known T12 fracture EXAM: CT CHEST, ABDOMEN, AND PELVIS WITH CONTRAST TECHNIQUE: Multidetector CT imaging of the chest, abdomen and pelvis was performed following the standard protocol during bolus administration of intravenous contrast. CONTRAST:  134mL OMNIPAQUE IOHEXOL 300 MG/ML  SOLN COMPARISON:  None. FINDINGS: CT CHEST FINDINGS Cardiovascular: Mild atherosclerotic changes of the thoracic aorta are noted. No aortic dissection or aneurysmal dilatation is seen. No aortic hematoma is noted. Heart is at the upper limits of normal in size. No significant coronary calcifications are seen. The pulmonary artery as visualized is within  normal limits although not timed for PE evaluation. Mediastinum/Nodes: Thoracic inlet is within normal limits. No hilar or mediastinal adenopathy is noted. The esophagus is within normal limits. Lungs/Pleura: Lungs are well aerated bilaterally. No focal infiltrate or sizable effusion is seen. No pneumothorax is noted. Musculoskeletal: T12 compression deformity is again identified. No other focal bony abnormality in the thorax is noted. CT ABDOMEN PELVIS FINDINGS Hepatobiliary: Gallbladder is well distended. The liver shows no focal mass lesion. Pancreas: Unremarkable. No pancreatic ductal dilatation or surrounding inflammatory changes. Spleen: Normal in size without focal abnormality. Adrenals/Urinary Tract: The adrenal glands are within normal limits. 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 Left renal artery is within normal limits. Left kidney demonstrates a normal enhancement pattern. Delayed images demonstrate normal excretion of contrast material. 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 The bladder is partially distended. Stomach/Bowel: The appendix is within normal limits. Colon is well visualized and within normal limits. No obstructive or inflammatory small bowel changes are seen. Vascular/Lymphatic: Aortic atherosclerosis. No enlarged abdominal or pelvic lymph nodes. Reproductive: Prostate is unremarkable. Other: No free fluid is noted within the pelvis. Some mild stranding is noted within the mesentery likely related to the recent trauma although no sizable hematoma is seen. Small fat containing inguinal hernia on the left is noted. Musculoskeletal: No acute bony abnormality is noted. Degenerative changes of the lumbar spine  are seen. IMPRESSION: T12 compression deformity similar to that seen on prior CT of the thoracic spine. 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 Mild stranding within the  mesenteric fat likely related to the recent blunt trauma. No sizable hematoma is seen. Electronically Signed   By: Inez Catalina M.D.   On: 06/22/2019 23:53   Ct T-spine No Charge  Result Date: 06/22/2019 CLINICAL DATA:  73 year old male status post 8 ft fall from ladder today. EXAM: CT THORACIC SPINE WITH CONTRAST TECHNIQUE: Multiplanar CT images of the thoracic spine were reconstructed from contemporary CT of the Chest. CONTRAST:  No additional COMPARISON:  Lumbar spine CT earlier today. CT Chest, Abdomen, and Pelvis today are reported separately. Cervical spine CT today. FINDINGS: Segmentation: Normal, concordant with the lumbar spine numbering today. Alignment: Preserved thoracic kyphosis. No spondylolisthesis. Vertebrae: Comminuted T12 superior endplate fracture with anterior wedging is stable in configuration from the earlier lumbar CT. The T12 pedicles and posterior elements remain intact. The adjacent T11 vertebra is intact. The other thoracic vertebrae are intact. Visible posterior ribs appear intact. Paraspinal and other soft tissues: Chest and abdomen findings today are reported separately. There is mild paraspinal edema adjacent to the T12 superior endplate fracture. Disc levels: Intermittent thoracic spine disc and endplate degeneration, but no CT evidence of significant thoracic spinal stenosis above T11. T11-T12: Circumferential disc bulge with mild posterior element hypertrophy. Broad-based posterior component of disc. Mild spinal stenosis. Mild T11 foraminal stenosis greater on the right. T12-L1: No retropulsion of bone. Mild disc bulge. No significant spinal stenosis. Mild left T12 foraminal stenosis. IMPRESSION: 1. Comminuted T12 superior endplate fracture with anterior wedging is stable from the earlier Lumbar CT (please see that report). No retropulsion of bone or complicating features. 2. No other acute osseous abnormality in the thoracic spine. 3. Superimposed degenerative mild thoracic  spinal stenosis at T11-T12, in part due to disc bulging. Similar degenerative T11 and left T12 foraminal stenosis. 4.  CT Chest, Abdomen, and Pelvis today are reported separately. Electronically Signed   By: Genevie Ann M.D.   On: 06/22/2019 23:49    CT's with findings of T12 compression fracture with 35% height loss, some stranding around mesentery of abdomen but no abdominal hematoma or internal injuries.  Patient continues moving around well here, no focal neurologic deficits.  He is tolerating oral fluids and medications without issue. Discussed with PA Costello with neurosurgery, recommend TLSO and office follow-up.  Results discussed with patient and son at bedside, they acknowledged understanding.  4:13 AM TLSO brace has been applied.  Discharge home with symptomatic care and close neurosurgical follow-up.  They can return here for any new/acute changes.   Larene Pickett, PA-C 06/23/19 [place], [place], DO 06/23/19 [id number]

## 2019-06-26 ENCOUNTER — Encounter: Payer: Self-pay | Admitting: Internal Medicine

## 2019-06-26 DIAGNOSIS — E1169 Type 2 diabetes mellitus with other specified complication: Secondary | ICD-10-CM | POA: Insufficient documentation

## 2019-06-26 DIAGNOSIS — E1122 Type 2 diabetes mellitus with diabetic chronic kidney disease: Secondary | ICD-10-CM | POA: Insufficient documentation

## 2019-06-26 NOTE — Progress Notes (Signed)
N  O      S  H  O  W                                                                                                                                                                                                                                                                                              This very nice 73 y.o. 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 Patient is  followed by Dr Brett Fairy for OSA on CPAP.  Patient is also still followed at  [facility] by Dr Clovis Pu & associates  for Depression.      Patient fell off of a ladder on 9/16  and had extensive CT evaluation in [facility]  On 9/17 with Head/Neck/Chest, Abd/Pelvic CT scans finding a 35% T12 compression Fx and was released with a back brace & referred to Neurosurgery (Dr Rita Ohara) in several weeks..          Patient is treated for HTN (1993) & BP has been controlled at home. Today's  . Patient has had no complaints of any cardiac type chest pain, palpitations, dyspnea / orthopnea / PND, dizziness, claudication, or dependent edema.      Hyperlipidemia is controlled with diet & meds. Patient denies myalgias or other med SE's. Last Lipids were at goal:  Lab Results  Component Value Date   CHOL 119 12/17/2018   HDL 52 12/17/2018   LDLCALC 51 12/17/2018   TRIG 77 12/17/2018   CHOLHDL 2.3 12/17/2018        Also, the patient has history of T2_NIDDM (2015) / CKD3 and has had no symptoms of reactive hypoglycemia, diabetic polys, paresthesias or visual blurring.  Last A1c was not at goal:  Lab Results  Component Value Date   HGBA1C 6.1 (H) 12/17/2018           Further, the patient also  has history of Vitamin D Deficiency ("29" / 2008)  and supplements vitamin D without any suspected side-effects. Last vitamin D was not at  goal (70-100):  Lab Results  Component Value Date   VD25OH 48 12/17/2018

## 2019-06-27 ENCOUNTER — Ambulatory Visit: Payer: Medicare Other | Admitting: Internal Medicine

## 2019-06-27 ENCOUNTER — Ambulatory Visit: Payer: 59 | Admitting: Psychiatry

## 2019-06-28 ENCOUNTER — Other Ambulatory Visit: Payer: Self-pay | Admitting: Adult Health

## 2019-06-28 DIAGNOSIS — E782 Mixed hyperlipidemia: Secondary | ICD-10-CM

## 2019-07-02 ENCOUNTER — Other Ambulatory Visit: Payer: Self-pay | Admitting: Psychiatry

## 2019-07-04 ENCOUNTER — Ambulatory Visit: Payer: 59 | Admitting: Psychiatry

## 2019-07-05 DIAGNOSIS — M545 Low back pain: Secondary | ICD-10-CM | POA: Diagnosis not present

## 2019-07-05 DIAGNOSIS — Z6827 Body mass index (BMI) 27.0-27.9, adult: Secondary | ICD-10-CM | POA: Diagnosis not present

## 2019-07-05 DIAGNOSIS — S22080A Wedge compression fracture of T11-T12 vertebra, initial encounter for closed fracture: Secondary | ICD-10-CM | POA: Diagnosis not present

## 2019-07-05 DIAGNOSIS — M546 Pain in thoracic spine: Secondary | ICD-10-CM | POA: Diagnosis not present

## 2019-07-05 NOTE — Telephone Encounter (Signed)
Due back 11/2019

## 2019-07-05 NOTE — Telephone Encounter (Signed)
Cody Hines called to request refill of his lorazepam.  Appt 11/21/19.  Send to [facility].

## 2019-07-11 ENCOUNTER — Ambulatory Visit: Payer: 59 | Admitting: Psychiatry

## 2019-07-12 ENCOUNTER — Ambulatory Visit (INDEPENDENT_AMBULATORY_CARE_PROVIDER_SITE_OTHER): Payer: Medicare Other | Admitting: Psychiatry

## 2019-07-12 ENCOUNTER — Other Ambulatory Visit: Payer: Self-pay

## 2019-07-12 DIAGNOSIS — F331 Major depressive disorder, recurrent, moderate: Secondary | ICD-10-CM

## 2019-07-12 NOTE — Progress Notes (Signed)
[facility] Counselor/Therapist Progress Note  Patient ID: Cody Krolczyk., MRN: [id number],    Date: 07/12/2019  Time Spent: 50 minutes  7:55am to 8:45am  Virtual Visit with Video Note Connected with patient by a video enabled telemedicine/telehealth application or telephone, with their informed consent, and verified patient privacy and that I am speaking with the correct person using two identifiers. I discussed the limitations, risks, security and privacy concerns of performing psychotherapy and management service by telephone and the availability of in person appointments. I also discussed with the patient that there may be a patient responsible charge related to this service. The patient expressed understanding and agreed to proceed. I discussed the treatment planning with the patient. The patient was provided an opportunity to ask questions and all were answered. The patient agreed with the plan and demonstrated an understanding of the instructions. The patient was advised to call  our office if  symptoms worsen or feel they are in a crisis state and need immediate contact.   Therapist Location: [facility] Patient Location: home   Treatment Type: Individual Therapy  Reported Symptoms: some depression and anxiety  Mental Status Exam:  Appearance:   Casual     Behavior:  Sharing  Motor:  n/a difficult to determine on video  Speech/Language:   some slower  Affect:  Depressed and anxious  Mood:  some depression and anxiety   Thought process:  normal and some tentativeness  Thought content:    WNL  Sensory/Perceptual disturbances:    WNL  Orientation:  oriented to person, place, situation, day of week, month of year and year  Attention:  Fair  Concentration:  Fair  Memory:  patient reports and demonstrates some short-term memory issues  Fund of knowledge:   Fair  Insight:    Fair  Judgment:   Fair  Impulse Control:  Good   Risk Assessment: Danger to  Self:  No Self-injurious Behavior: No Danger to Others: No Duty to Warn:no Physical Aggression / Violence:No  Access to Firearms a concern: No  Gang Involvement:No   Subjective: Patient had recent fall from ladder and is continuing to get treatment, and reportedly has an MRI later this week for his back.  Reports some depression and anxiety.    Interventions: Solution-Oriented/Positive Psychology and Ego-Supportive  Diagnosis:   ICD-10-CM   1. Major depressive disorder, recurrent episode, moderate ([place])  F33.1     Plan:  Patient is not signing tx goals on computer screen due to [place].  Treatment Goals: Goals remain on tx plan as patient works on strategies to achieve his goals.  Progress is documented each session in the "Progress" section of Plan.   Long term goal: Develop healthy cognitive patterns and beliefs about self and the world that lead to alleviation and help prevent relapse of depression.  Short term goal: Identify and replace depressive thinking that leads to depressive feelings and actions.   Strategy: Patient to more consistently monitor his thoughts and catch the depressive/negative thoughts, trying to change them to positive. Look at his negative thoughts and weigh them against the evidence.  PROGRESS: Patient in some distress today after encountering a fall recently.  Is to have more testing and MRI this week.  Due to some anxiety about recent fall and "having to have others do things for him", it was difficult for patient to focus on his goals today.  He seemed to really need to process what all has been happening for  him due to the fall.  Is separated wife did step up to assist in some meaningful ways which was good for patient.  We were able to eventually talk about some of his thoughts and how the negative self-talk works against him.  Gave examples of this in session today.  Encouraged him to work on recognizing and  interrupting the negative thoughts.  We  will continue this goal-directed work next session.  Next appt in 2-3 wks.   Shanon Ace, LCSW

## 2019-07-14 DIAGNOSIS — S22080A Wedge compression fracture of T11-T12 vertebra, initial encounter for closed fracture: Secondary | ICD-10-CM | POA: Diagnosis not present

## 2019-07-14 DIAGNOSIS — M4804 Spinal stenosis, thoracic region: Secondary | ICD-10-CM | POA: Diagnosis not present

## 2019-07-18 NOTE — Progress Notes (Signed)
6 Month Follow Up and Hospital follow up   Assessment and Plan:  Cody Hines was seen today for follow-up.  Diagnoses and all orders for this visit:  Essential hypertension Continue medications: Vasotec 20mg  daily Monitor blood pressure at home; call if consistently over 130/80 Continue DASH diet.   Reminder to go to the ER if any CP, SOB, nausea, dizziness, severe HA, changes vision/speech, left arm numbness and tingling and jaw pain.  -     CBC with Differential/Platelet -     COMPLETE METABOLIC PANEL WITH GFR -     Magnesium  Hyperlipidemia associated with type 2 diabetes mellitus (HCC) Continue medications: Crestor 40mg  Discussed dietary and exercise modifications Low fat diet -     Lipid panel  Gastroesophageal reflux disease, unspecified whether esophagitis present Doing well at this time Continue to monitor for triggers  Type 2 diabetes mellitus with stage 3a chronic kidney disease, without long-term current use of insulin ([place]) Managed with diet Discussed dietary and exercise modifications -     Hemoglobin A1c  Irritable bowel syndrome, unspecified type Doing well at this time Continue management  OSA on CPAP Sleep apnea- continue CPAP, CPAP is helping with daytime fatigue, weight loss still advised.    Benign prostatic hyperplasia with urinary frequency  Vitamin D deficiency -     VITAMIN D 25 Hydroxy (Vit-D Deficiency, Fractures)  Obesity BMI 26.0-26.0 Discussed dietary and exercise modifications  Stage 3a chronic kidney disease Continue glucose monitoring Continue medications Increase fluids Avoid NSAIDS Discussed dietary modifications and exercise Will continue to monitor  Major depressive disorder, recurrent episode, moderate (HCC) Doing well at this time Continue Zoloft  Medication management Continued  Fall from ladder, subsequent encounter  Non-traumatic compression fracture of T12 thoracic vertebra with routine healing, subsequent  encounter Following with Raliegh Ip for this Had MRI today Taking Tylenol 1,000mg  PRN for pain Discussed no more than 3,000mg  daily Increase water intake Discussed activity restrictions with Dr Arnoldo Morale related to work  [employer] and fatigue Will check B12 and Vit D  Screening for iron deficiency anemia -     Vitamin B12 -     Iron,Total/Total Iron Binding Cap    Continue diet and meds as discussed. Further disposition pending results of labs. Discussed med's effects and SE's.  Patient agrees with plan of care and opportunity to ask questions/voice concerns. Over 30 minutes of chart review, interview, exam, counseling, and critical decision making was performed.   Future Appointments  Date Time Provider [place]  08/01/2019  9:00 AM Shanon Ace, LCSW [facility] None  09/27/2019  9:00 AM Liane Comber, NP [facility] None  11/21/2019  9:00 AM Cottle, Billey Co., MD [facility] None  01/03/2020  9:00 AM [name], MD [facility] None  05/14/2020  9:00 AM Ward Givens, NP [facility] None    ----------------------------------------------------------------------------------------------------------------------  HPI 73 y.o. male  presents for 6 month follow up on HTN, HLD, Hypothyroidism, history of pre-diabetes, weight and vitamin D deficiency.   He had an ER visit on 06/22/19 for evaluation of a fall.  He was 8-10 ft up on a ladder when he fell, unsure of why.  He had pain to his mid lower and thoracic back, and neck.  Since then he has been having severe pain.  Reports that he is taking tylenol for pain.  He was given percocet in the ER but he let them know it made him sick so they gave him zofran to take with this. He reports he is  sleeping for two hours then wakes up in pain.  He also then takes naps during the day which messing with him sleeping at night when he changes positions. He reports he has a poor appetite related to this.  He just does not feel like eating.  He has  lost almost 24lbs related to this.    He had an MRI today and they are to call results.  He has follow up with Dr Arnoldo Morale in one month.  He has been at home resting.  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    CT Impression: Comminuted T12 superior endplate fracture with anterior wedging is stable from the earlier Lumbar CT  CT Abdomen & Pelvis IMPRESSION: T12 compression deformity similar to that seen on prior CT of the thoracic spine.  He has been having constipation.  He has increase his fiber intake.  Discussed this related to poor appetite and decreased activity.   BMI is Body mass index is 26.91 kg/m., he has been working on diet and exercise, until injury. Wt Readings from Last 3 Encounters:  07/19/19 204 lb (92.5 kg)  05/10/19 221 lb 12.8 oz (100.6 kg)  03/25/19 214 lb (97.1 kg)    His blood pressure has been controlled at home, today their BP is BP: 110/62  He does workout. He denies any cardiac symptoms, chest pains, palpitations, shortness of breath, dizziness or lower extremity edema.     He is on cholesterol medication Rosuvastatin and denies myalgias. His cholesterol is at goal. The cholesterol last visit was:   Lab Results  Component Value Date   CHOL 119 12/17/2018   HDL 52 12/17/2018   LDLCALC 51 12/17/2018   TRIG 77 12/17/2018   CHOLHDL 2.3 12/17/2018    He has been working on diet and exercise for DMII, and denies nausea, paresthesia of the feet, polydipsia, polyuria, visual disturbances and vomiting. Last A1C in the office was:  Lab Results  Component Value Date   HGBA1C 6.1 (H) 12/17/2018   Patient is on Vitamin D supplement.   Lab Results  Component Value Date   VD25OH 48 12/17/2018       Current Medications:  Current Outpatient Medications on File Prior to Visit  Medication Sig  . acetaminophen (TYLENOL) 650 MG CR tablet Take 650 mg by mouth every 8 (eight) hours as needed for pain (or back  pain or migraines).   Marland Kitchen aspirin EC 81 MG tablet Take 81 mg by mouth daily.  . Cholecalciferol (VITAMIN D3) 250 MCG (10000 UT) capsule Take 10,000 Units by mouth daily.  . enalapril (VASOTEC) 20 MG tablet Take 1 tablet Daily for BP (Patient taking differently: Take 20 mg by mouth daily. )  . Flaxseed, Linseed, (FLAX SEED OIL) 1000 MG CAPS Take 2,000 mg by mouth daily.   Marland Kitchen LORazepam (ATIVAN) 1 MG tablet TAKE 1 TABLET BY MOUTH EVERY NIGHT AT BEDTIME  . Magnesium 500 MG TABS Take 500 mg by mouth daily.   . ondansetron (ZOFRAN ODT) 4 MG disintegrating tablet Take 1 tablet (4 mg total) by mouth every 8 (eight) hours as needed for nausea.  . rosuvastatin (CRESTOR) 40 MG tablet Take 1 tablet Daily for Cholesterol  . sertraline (ZOLOFT) 100 MG tablet Take 2 tablets (200 mg total) by mouth daily.  Marland Kitchen oxyCODONE-acetaminophen (PERCOCET) 5-325 MG tablet Take 1 tablet by mouth every 4 (four) hours as needed. (Patient not taking: Reported on  07/19/2019)   No current facility-administered medications on file prior to visit.     Allergies:  Allergies  Allergen Reactions  . Asa [Aspirin] Diarrhea, Nausea Only and Other (See Comments)    History of ulcers, also  . Atorvastatin Other (See Comments)    Muscle aches  . Celebrex [Celecoxib] Other (See Comments)    Headaches   . Codeine Nausea And Vomiting  . Savella [Milnacipran Hcl] Other (See Comments)    Reaction not recalled  . Viagra [Sildenafil Citrate] Other (See Comments)    Headaches  . Penicillins Rash and Other (See Comments)     Medical History:  Past Medical History:  Diagnosis Date  . BPH (benign prostatic hyperplasia)   . Cancer (HCC)    nose, skin cancer  . Elevated hemoglobin A1c   . Fibromyalgia   . GERD (gastroesophageal reflux disease)   . Hyperlipidemia   . Hypertension   . Hypogonadism male   . IBS (irritable bowel syndrome)   . OSA (obstructive sleep apnea)     Family history- Reviewed and unchanged   Social  history- Reviewed and unchanged   Patient Care Team: [name], MD as PCP - General (Internal Medicine) Druscilla Brownie, MD as Consulting Physician (Dermatology) Teena Irani, MD (Inactive) as Consulting Physician (Gastroenterology) Newman Pies, MD as Consulting Physician (Neurosurgery) Clent Jacks, MD as Consulting Physician (Ophthalmology)   Screening Tests: Immunization History  Administered Date(s) Administered  . Pneumococcal Conjugate-13 09/17/2018  . Pneumococcal-Unspecified 07/16/2011  . Tdap 01/13/2010  . Zoster 08/09/2013     Vaccinations: TD or Tdap:4 /2011  Influenza: DUE Pneumococcal: 10/12 Prevnar13: 12/19 Shingles: Zostavax/Shingrix: 08/2013  Preventative Care: Last colonoscopy: 04/2013 Hep C screening (1945-1965): 05/05/2014 Neg   Imaging: Chest X-ray: 09/2008 CT: Abdomen Pelvis, Chest,Cervical Spine, Head 06/22/2019 EKG: 06/2019     Review of Systems:  Review of Systems  Constitutional: Negative for chills, diaphoresis, fever, malaise/fatigue and weight loss.  HENT: Negative for congestion, ear discharge, ear pain, hearing loss, nosebleeds, sinus pain, sore throat and tinnitus.   Eyes: Negative for blurred vision, double vision, photophobia, pain, discharge and redness.  Respiratory: Negative for cough, hemoptysis, sputum production, shortness of breath, wheezing and stridor.   Cardiovascular: Negative for chest pain, palpitations, orthopnea, claudication, leg swelling and PND.  Gastrointestinal: Positive for constipation. Negative for abdominal pain, blood in stool, diarrhea, heartburn, melena, nausea and vomiting.  Genitourinary: Negative for dysuria, flank pain, frequency, hematuria and urgency.  Musculoskeletal: Positive for back pain. Negative for falls, joint pain, myalgias and neck pain.  Skin: Negative for itching and rash.  Neurological: Negative for dizziness, tingling, tremors, sensory change, speech change, focal weakness,  seizures, loss of consciousness, weakness and headaches.  Endo/Heme/Allergies: Negative for environmental allergies and polydipsia. Does not bruise/bleed easily.  Psychiatric/Behavioral: Negative for depression, hallucinations, memory loss, substance abuse and suicidal ideas. The patient is not nervous/anxious and does not have insomnia.       Physical Exam: BP 110/62   Pulse (!) 55   Temp 97.7 F (36.5 C)   Ht 6\' 1"  (1.854 m)   Wt 204 lb (92.5 kg)   SpO2 97%   BMI 26.91 kg/m  Wt Readings from Last 3 Encounters:  07/19/19 204 lb (92.5 kg)  05/10/19 221 lb 12.8 oz (100.6 kg)  03/25/19 214 lb (97.1 kg)   General Appearance: Well nourished, in no apparent distress. Eyes: PERRLA, EOMs, conjunctiva no swelling or erythema Sinuses: No Frontal/maxillary tenderness ENT/Mouth: Ext aud canals clear, TMs without erythema,  bulging. No erythema, swelling, or exudate on post pharynx.  Tonsils not swollen or erythematous. Hearing normal.  Neck: Supple, thyroid normal.  Respiratory: Respiratory effort normal, BS equal bilaterally without rales, rhonchi, wheezing or stridor.  Cardio: RRR with no MRGs. Brisk peripheral pulses without edema.  Abdomen: Soft, + BS.  Non tender, no guarding, rebound, hernias, masses. Lymphatics: Non tender without lymphadenopathy.  Musculoskeletal: Full ROM, 5/5 strength, Normal gait.  Tenderness to palpation thoracic/lumbar spine. Skin: Warm, dry without rashes, lesions, ecchymosis.  Neuro: Cranial nerves intact. No cerebellar symptoms.  Psych: Awake and oriented X 3, normal affect, Insight and Judgment appropriate.    Garnet Sierras, NP [facility] 1:46 PM

## 2019-07-19 ENCOUNTER — Encounter: Payer: Self-pay | Admitting: Adult Health Nurse Practitioner

## 2019-07-19 ENCOUNTER — Ambulatory Visit (INDEPENDENT_AMBULATORY_CARE_PROVIDER_SITE_OTHER): Payer: Medicare Other | Admitting: Adult Health Nurse Practitioner

## 2019-07-19 ENCOUNTER — Other Ambulatory Visit: Payer: Self-pay

## 2019-07-19 VITALS — BP 110/62 | HR 55 | Temp 97.7°F | Ht 73.0 in | Wt 204.0 lb

## 2019-07-19 DIAGNOSIS — Z9989 Dependence on other enabling machines and devices: Secondary | ICD-10-CM

## 2019-07-19 DIAGNOSIS — F331 Major depressive disorder, recurrent, moderate: Secondary | ICD-10-CM | POA: Diagnosis not present

## 2019-07-19 DIAGNOSIS — E1169 Type 2 diabetes mellitus with other specified complication: Secondary | ICD-10-CM

## 2019-07-19 DIAGNOSIS — E785 Hyperlipidemia, unspecified: Secondary | ICD-10-CM | POA: Diagnosis not present

## 2019-07-19 DIAGNOSIS — K589 Irritable bowel syndrome without diarrhea: Secondary | ICD-10-CM

## 2019-07-19 DIAGNOSIS — Z6826 Body mass index (BMI) 26.0-26.9, adult: Secondary | ICD-10-CM | POA: Diagnosis not present

## 2019-07-19 DIAGNOSIS — Z79899 Other long term (current) drug therapy: Secondary | ICD-10-CM | POA: Diagnosis not present

## 2019-07-19 DIAGNOSIS — R5381 Other malaise: Secondary | ICD-10-CM

## 2019-07-19 DIAGNOSIS — Z130 Encounter for screening for diseases of the blood and blood-forming organs and certain disorders involving the immune mechanism: Secondary | ICD-10-CM

## 2019-07-19 DIAGNOSIS — R5383 Other fatigue: Secondary | ICD-10-CM

## 2019-07-19 DIAGNOSIS — N1831 Chronic kidney disease, stage 3a: Secondary | ICD-10-CM | POA: Diagnosis not present

## 2019-07-19 DIAGNOSIS — E559 Vitamin D deficiency, unspecified: Secondary | ICD-10-CM

## 2019-07-19 DIAGNOSIS — N401 Enlarged prostate with lower urinary tract symptoms: Secondary | ICD-10-CM

## 2019-07-19 DIAGNOSIS — W11XXXD Fall on and from ladder, subsequent encounter: Secondary | ICD-10-CM

## 2019-07-19 DIAGNOSIS — E538 Deficiency of other specified B group vitamins: Secondary | ICD-10-CM | POA: Diagnosis not present

## 2019-07-19 DIAGNOSIS — E1121 Type 2 diabetes mellitus with diabetic nephropathy: Secondary | ICD-10-CM

## 2019-07-19 DIAGNOSIS — G4733 Obstructive sleep apnea (adult) (pediatric): Secondary | ICD-10-CM | POA: Diagnosis not present

## 2019-07-19 DIAGNOSIS — E1122 Type 2 diabetes mellitus with diabetic chronic kidney disease: Secondary | ICD-10-CM

## 2019-07-19 DIAGNOSIS — K219 Gastro-esophageal reflux disease without esophagitis: Secondary | ICD-10-CM | POA: Diagnosis not present

## 2019-07-19 DIAGNOSIS — M4854XD Collapsed vertebra, not elsewhere classified, thoracic region, subsequent encounter for fracture with routine healing: Secondary | ICD-10-CM

## 2019-07-19 DIAGNOSIS — I10 Essential (primary) hypertension: Secondary | ICD-10-CM | POA: Diagnosis not present

## 2019-07-19 NOTE — Patient Instructions (Addendum)
Keep up the good work with the water intake!    We will get back to you with your lab results from today via [place] in 1-3 days.  Let us know if our office can assist you in any way with managing your back pain/compression fracture.    Vit D  & Vit C 1,000 mg   are recommended to help protect  against the Covid-19 and other Corona viruses.    Also it's recommended  to take  Zinc 50 mg  to help  protect against the Covid-19   and best place to get  is also on [email or web address]  and don't pay more than 6-8 cents /pill !  ================================ Coronavirus (COVID-19) Are you at risk?  Are you at risk for the Coronavirus (COVID-19)?  To be considered HIGH RISK for Coronavirus (COVID-19), you have to meet the following criteria:  . Traveled to [place], [place], [place], [place] or [place]; or in the [place] to [place], [place], [place]  . or [place]; and have fever, cough, and shortness of breath within the last 2 weeks of travel OR . Been in close contact with a person diagnosed with COVID-19 within the last 2 weeks and have  . fever, cough,and shortness of breath .  . IF YOU DO NOT MEET THESE CRITERIA, YOU ARE CONSIDERED LOW RISK FOR COVID-19.  What to do if you are HIGH RISK for COVID-19?  Marland Kitchen If you are having a medical emergency, call 911. . Seek medical care right away. Before you go to a doctor's office, urgent care or emergency department, .  call ahead and tell them about your recent travel, contact with someone diagnosed with COVID-19  .  and your symptoms.  . You should receive instructions from your physician's office regarding next steps of care.  . When you arrive at healthcare provider, tell the healthcare staff immediately you have returned from  . visiting [place], [place], [place], [place] or [place]; or traveled in the [place] to [place], [place],  . [place] or [place] in the last two weeks or you have been in close contact with a  person diagnosed with  . COVID-19 in the last 2 weeks.   . Tell the health care staff about your symptoms: fever, cough and shortness of breath. . After you have been seen by a medical provider, you will be either: o Tested for (COVID-19) and discharged home on quarantine except to seek medical care if  o symptoms worsen, and asked to  - Stay home and avoid contact with others until you get your results (4-5 days)  - Avoid travel on public transportation if possible (such as bus, train, or airplane) or o Sent to the Emergency Department by EMS for evaluation, COVID-19 testing  and  o possible admission depending on your condition and test results.  What to do if you are LOW RISK for COVID-19?  Reduce your risk of any infection by using the same precautions used for avoiding the common cold or flu:  Marland Kitchen Wash your hands often with soap and warm water for at least 20 seconds.  If soap and water are not readily available,  . use an alcohol-based hand sanitizer with at least 60% alcohol.  . If coughing or sneezing, cover your mouth and nose by coughing or sneezing into the elbow areas of your shirt or coat, .  into a tissue or into your sleeve (not your hands). . Avoid shaking  hands with others and consider head nods or verbal greetings only. . Avoid touching your eyes, nose, or mouth with unwashed hands.  . Avoid close contact with people who are sick. . Avoid places or events with large numbers of people in one location, like concerts or sporting events. . Carefully consider travel plans you have or are making. . If you are planning any travel outside or inside the [place], visit the CDC's Travelers' Health webpage for the latest health notices. . If you have some symptoms but not all symptoms, continue to monitor at home and seek medical attention  . if your symptoms worsen. . If you are having a medical emergency, call 911. >>>>>>>>>>>>>>>>>>>>>>>>>>>>>>>>>>>>>>>>>>>>>>>>>>>>>>> We Do NOT Approve  of  [facility] [place] Soliciting Our Patients  To Do Home Visits  & We Do NOT Approve of [employer] > > > > > > > > > > > > > > > > > > > > > > > > > > > > > > > > > > >  > > > >   Preventive Care for Adults  A healthy lifestyle and preventive care can promote health and wellness. Preventive health guidelines for men include the following key practices:  A routine yearly physical is a good way to check with your health care provider about your health and preventative screening. It is a chance to share any concerns and updates on your health and to receive a thorough exam.  Visit your dentist for a routine exam and preventative care every 6 months. Brush your teeth twice a day and floss once a day. Good oral hygiene prevents tooth decay and gum disease.  The frequency of eye exams is based on your age, health, family medical history, use of contact lenses, and other factors. Follow your health care provider's recommendations for frequency of eye exams.  Eat a healthy diet. Foods such as vegetables, fruits, whole grains, low-fat dairy products, and lean protein foods contain the nutrients you need without too many calories. Decrease your intake of foods high in solid fats, added sugars, and salt. Eat the right amount of calories for you. Get information about a proper diet from your health care provider, if necessary.  Regular physical exercise is one of the most important things you can do for your health. Most adults should get at least 150 minutes of moderate-intensity exercise (any activity that increases your heart rate and causes you to sweat) each week. In addition, most adults need muscle-strengthening exercises on 2 or more days a week.  Maintain a healthy weight. The body mass index (BMI) is a screening tool to identify possible weight problems. It provides an estimate of body fat based on height and weight. Your health care provider can find your BMI and can help  you achieve or maintain a healthy weight. For adults 20 years and older:  A BMI below 18.5 is considered underweight.  A BMI of 18.5 to 24.9 is normal.  A BMI of 25 to 29.9 is considered overweight.  A BMI of 30 and above is considered obese.  Maintain normal blood lipids and cholesterol levels by exercising and minimizing your intake of saturated fat. Eat a balanced diet with plenty of fruit and vegetables. Blood tests for lipids and cholesterol should begin at age 63 and be repeated every 5 years. If your lipid or cholesterol levels are high, you are over 50, or you are at high risk for heart disease, you may  need your cholesterol levels checked more frequently. Ongoing high lipid and cholesterol levels should be treated with medicines if diet and exercise are not working.  If you smoke, find out from your health care provider how to quit. If you do not use tobacco, do not start.  Lung cancer screening is recommended for adults aged 75-80 years who are at high risk for developing lung cancer because of a history of smoking. A yearly low-dose CT scan of the lungs is recommended for people who have at least a 30-pack-year history of smoking and are a current smoker or have quit within the past 15 years. A pack year of smoking is smoking an average of 1 pack of cigarettes a day for 1 year (for example: 1 pack a day for 30 years or 2 packs a day for 15 years). Yearly screening should continue until the smoker has stopped smoking for at least 15 years. Yearly screening should be stopped for people who develop a health problem that would prevent them from having lung cancer treatment.  If you choose to drink alcohol, do not have more than 2 drinks per day. One drink is considered to be 12 ounces (355 mL) of beer, 5 ounces (148 mL) of wine, or 1.5 ounces (44 mL) of liquor.  Avoid use of street drugs. Do not share needles with anyone. Ask for help if you need support or instructions about stopping the  use of drugs.  High blood pressure causes heart disease and increases the risk of stroke. Your blood pressure should be checked at least every 1-2 years. Ongoing high blood pressure should be treated with medicines, if weight loss and exercise are not effective.  If you are 87-74 years old, ask your health care provider if you should take aspirin to prevent heart disease.  Diabetes screening involves taking a blood sample to check your fasting blood sugar level. Testing should be considered at a younger age or be carried out more frequently if you are overweight and have at least 1 risk factor for diabetes.  Colorectal cancer can be detected and often prevented. Most routine colorectal cancer screening begins at the age of 59 and continues through age 18. However, your health care provider may recommend screening at an earlier age if you have risk factors for colon cancer. On a yearly basis, your health care provider may provide home test kits to check for hidden blood in the stool. Use of a small camera at the end of a tube to directly examine the colon (sigmoidoscopy or colonoscopy) can detect the earliest forms of colorectal cancer. Talk to your health care provider about this at age 32, when routine screening begins. Direct exam of the colon should be repeated every 5-10 years through age 33, unless early forms of precancerous polyps or small growths are found.  Hepatitis C blood testing is recommended for all people born from [birth year] through 1963 and any individual with known risks for hepatitis C.  Screening for abdominal aortic aneurysm (AAA)  by ultrasound is recommended for people who have history of high blood pressure or who are current or former smokers.  Healthy men should  receive prostate-specific antigen (PSA) blood tests as part of routine cancer screening. Talk with your health care provider about prostate cancer screening.  Testicular cancer screening is  recommended for adult males.  Screening includes self-exam, a health care provider exam, and other screening tests. Consult with your health care provider about any symptoms you have or  any concerns you have about testicular cancer.  Use sunscreen. Apply sunscreen liberally and repeatedly throughout the day. You should seek shade when your shadow is shorter than you. Protect yourself by wearing long sleeves, pants, a wide-brimmed hat, and sunglasses year round, whenever you are outdoors.  Once a month, do a whole-body skin exam, using a mirror to look at the skin on your back. Tell your health care provider about new moles, moles that have irregular borders, moles that are larger than a pencil eraser, or moles that have changed in shape or color.  Stay current with required vaccines (immunizations).  Influenza vaccine. All adults should be immunized every year.  Tetanus, diphtheria, and acellular pertussis (Td, Tdap) vaccine. An adult who has not previously received Tdap or who does not know his vaccine status should receive 1 dose of Tdap. This initial dose should be followed by tetanus and diphtheria toxoids (Td) booster doses every 10 years. Adults with an unknown or incomplete history of completing a 3-dose immunization series with Td-containing vaccines should begin or complete a primary immunization series including a Tdap dose. Adults should receive a Td booster every 10 years.  Zoster vaccine. One dose is recommended for adults aged 42 years or older unless certain conditions are present.    PREVNAR - Pneumococcal 13-valent conjugate (PCV13) vaccine. When indicated, a person who is uncertain of his immunization history and has no record of immunization should receive the PCV13 vaccine. An adult aged 89 years or older who has certain medical conditions and has not been previously immunized should receive 1 dose of PCV13 vaccine. This PCV13 should be followed with a dose of pneumococcal polysaccharide (PPSV23) vaccine. The  PPSV23 vaccine dose should be obtained 1 or more year(s)after the dose of PCV13 vaccine. An adult aged 72 years or older who has certain medical conditions and previously received 1 or more doses of PPSV23 vaccine should receive 1 dose of PCV13. The PCV13 vaccine dose should be obtained 1 or more years after the last PPSV23 vaccine dose.    PNEUMOVAX - Pneumococcal polysaccharide (PPSV23) vaccine. When PCV13 is also indicated, PCV13 should be obtained first. All adults aged 16 years and older should be immunized. An adult younger than age 65 years who has certain medical conditions should be immunized. Any person who resides in a nursing home or long-term care facility should be immunized. An adult smoker should be immunized. People with an immunocompromised condition and certain other conditions should receive both PCV13 and PPSV23 vaccines. People with human immunodeficiency virus (HIV) infection should be immunized as soon as possible after diagnosis. Immunization during chemotherapy or radiation therapy should be avoided. Routine use of PPSV23 vaccine is not recommended for American Indians, Mulat Natives, or people younger than 65 years unless there are medical conditions that require PPSV23 vaccine. When indicated, people who have unknown immunization and have no record of immunization should receive PPSV23 vaccine. One-time revaccination 5 years after the first dose of PPSV23 is recommended for people aged 19-64 years who have chronic kidney failure, nephrotic syndrome, asplenia, or immunocompromised conditions. People who received 1-2 doses of PPSV23 before age 32 years should receive another dose of PPSV23 vaccine at age 41 years or later if at least 5 years have passed since the previous dose. Doses of PPSV23 are not needed for people immunized with PPSV23 at or after age 6 years.    Hepatitis A vaccine. Adults who wish to be protected from this disease, have certain high-risk conditions,  work  with hepatitis A-infected animals, work in hepatitis A research labs, or travel to or work in countries with a high rate of hepatitis A should be immunized. Adults who were previously unvaccinated and who anticipate close contact with an international adoptee during the first 60 days after arrival in the [place] from a country with a high rate of hepatitis A should be immunized.    Hepatitis B vaccine. Adults should be immunized if they wish to be protected from this disease, have certain high-risk conditions, may be exposed to blood or other infectious body fluids, are household contacts or sex partners of hepatitis B positive people, are clients or workers in certain care facilities, or travel to or work in countries with a high rate of hepatitis B.   Preventive Service / Frequency   Ages 30 and over  Blood pressure check.  Lipid and cholesterol check.  Lung cancer screening. / Every year if you are aged 55-80 years and have a 30-pack-year history of smoking and currently smoke or have quit within the past 15 years. Yearly screening is stopped once you have quit smoking for at least 15 years or develop a health problem that would prevent you from having lung cancer treatment.  Fecal occult blood test (FOBT) of stool. You may not have to do this test if you get a colonoscopy every 10 years.  Flexible sigmoidoscopy** or colonoscopy.** / Every 5 years for a flexible sigmoidoscopy or every 10 years for a colonoscopy beginning at age 61 and continuing until age 41.  Hepatitis C blood test.** / For all people born from [birth year] through 1963 and any individual with known risks for hepatitis C.  Abdominal aortic aneurysm (AAA) screening./ Screening current or former smokers or have Hypertension.  Skin self-exam. / Monthly.  Influenza vaccine. / Every year.  Tetanus, diphtheria, and acellular pertussis (Tdap/Td) vaccine.** / 1 dose of Td every 10 years.   Zoster vaccine.** / 1 dose for  adults aged 23 years or older.         Pneumococcal 13-valent conjugate (PCV13) vaccine.    Pneumococcal polysaccharide (PPSV23) vaccine.     Hepatitis A vaccine.** / Consult your health care provider.  Hepatitis B vaccine.** / Consult your health care provider. Screening for abdominal aortic aneurysm (AAA)  by ultrasound is recommended for people who have history of high blood pressure or who are current or former smokers. ++++++++++ Recommend Adult Low Dose Aspirin or  coated  Aspirin 81 mg daily  To reduce risk of Colon Cancer 40 %,  Skin Cancer 26 % ,  Malignant Melanoma 46%  and  Pancreatic cancer 60% ++++++++++++++++++++++ Vitamin D goal  is between 70-100.  Please make sure that you are taking your Vitamin D as directed.  It is very important as a natural anti-inflammatory  helping hair, skin, and nails, as well as reducing stroke and heart attack risk.  It helps your bones and helps with mood. It also decreases numerous cancer risks so please take it as directed.  Low Vit D is associated with a 200-300% higher risk for CANCER  and 200-300% higher risk for HEART   ATTACK  &  STROKE.   .....................................Marland Kitchen It is also associated with higher death rate at younger ages,  autoimmune diseases like Rheumatoid arthritis, Lupus, Multiple Sclerosis.    Also many other serious conditions, like depression, Alzheimer's Dementia, infertility, muscle aches, fatigue, fibromyalgia - just to name a few. ++++++++++++++++++++++ Recommend the book "The END of  DIETING" by Dr Excell Seltzer  & the book "The END of DIABETES " by Dr Excell Seltzer At [email or web address] - get book & Audio CD's    Being diabetic has a  300% increased risk for heart attack, stroke, cancer, and alzheimer- type vascular dementia. It is very important that you work harder with diet by avoiding all foods that are white. Avoid white rice (brown & wild rice is OK), white potatoes (sweetpotatoes in moderation is  OK), White bread or wheat bread or anything made out of white flour like bagels, donuts, rolls, buns, biscuits, cakes, pastries, cookies, pizza crust, and pasta (made from white flour & egg whites) - vegetarian pasta or spinach or wheat pasta is OK. Multigrain breads like Arnold's or [employer], or multigrain sandwich thins or flatbreads.  Diet, exercise and weight loss can reverse and cure diabetes in the early stages.  Diet, exercise and weight loss is very important in the control and prevention of complications of diabetes which affects every system in your body, ie. Brain - dementia/stroke, eyes - glaucoma/blindness, heart - heart attack/heart failure, kidneys - dialysis, stomach - gastric paralysis, intestines - malabsorption, nerves - severe painful neuritis, circulation - gangrene & loss of a leg(s), and finally cancer and Alzheimers.    I recommend avoid fried & greasy foods,  sweets/candy, white rice (brown or wild rice or Quinoa is OK), white potatoes (sweet potatoes are OK) - anything made from white flour - bagels, doughnuts, rolls, buns, biscuits,white and wheat breads, pizza crust and traditional pasta made of white flour & egg white(vegetarian pasta or spinach or wheat pasta is OK).  Multi-grain bread is OK - like multi-grain flat bread or sandwich thins. Avoid alcohol in excess. Exercise is also important.    Eat all the vegetables you want - avoid meat, especially red meat and dairy - especially cheese.  Cheese is the most concentrated form of trans-fats which is the worst thing to clog up our arteries. Veggie cheese is OK which can be found in the fresh produce section at [employer] or [employer] or [employer]  ++++++++++++++++++++++ DASH Eating Plan  DASH stands for "Dietary Approaches to Stop Hypertension."   The DASH eating plan is a healthy eating plan that has been shown to reduce high blood pressure (hypertension). Additional health benefits may include reducing the risk  of type 2 diabetes mellitus, heart disease, and stroke. The DASH eating plan may also help with weight loss. WHAT DO I NEED TO KNOW ABOUT THE DASH EATING PLAN? For the DASH eating plan, you will follow these general guidelines:  Choose foods with a percent daily value for sodium of less than 5% (as listed on the food label).  Use salt-free seasonings or herbs instead of table salt or sea salt.  Check with your health care provider or pharmacist before using salt substitutes.  Eat lower-sodium products, often labeled as "lower sodium" or "no salt added."  Eat fresh foods.  Eat more vegetables, fruits, and low-fat dairy products.  Choose whole grains. Look for the word "whole" as the first word in the ingredient list.  Choose fish   Limit sweets, desserts, sugars, and sugary drinks.  Choose heart-healthy fats.  Eat veggie cheese   Eat more home-cooked food and less restaurant, buffet, and fast food.  Limit fried foods.  Cook foods using methods other than frying.  Limit canned vegetables. If you do use them, rinse them well to decrease the sodium.  When eating at a restaurant,  ask that your food be prepared with less salt, or no salt if possible.                      WHAT FOODS CAN I EAT? Read Dr Fara Olden Fuhrman's books on The End of Dieting & The End of Diabetes  Grains Whole grain or whole wheat bread. Brown rice. Whole grain or whole wheat pasta. Quinoa, bulgur, and whole grain cereals. Low-sodium cereals. Corn or whole wheat flour tortillas. Whole grain cornbread. Whole grain crackers. Low-sodium crackers.  Vegetables Fresh or frozen vegetables (raw, steamed, roasted, or grilled). Low-sodium or reduced-sodium tomato and vegetable juices. Low-sodium or reduced-sodium tomato sauce and paste. Low-sodium or reduced-sodium canned vegetables.   Fruits All fresh, canned (in natural juice), or frozen fruits.  Protein Products  All fish and seafood.  Dried beans, peas, or  lentils. Unsalted nuts and seeds. Unsalted canned beans.  Dairy Low-fat dairy products, such as skim or 1% milk, 2% or reduced-fat cheeses, low-fat ricotta or cottage cheese, or plain low-fat yogurt. Low-sodium or reduced-sodium cheeses.  Fats and Oils Tub margarines without trans fats. Light or reduced-fat mayonnaise and salad dressings (reduced sodium). Avocado. Safflower, olive, or canola oils. Natural peanut or almond butter.  Other Unsalted popcorn and pretzels. The items listed above may not be a complete list of recommended foods or beverages. Contact your dietitian for more options.  ++++++++++++++++++++  WHAT FOODS ARE NOT RECOMMENDED? Grains/ White flour or wheat flour White bread. White pasta. White rice. Refined cornbread. Bagels and croissants. Crackers that contain trans fat.  Vegetables  Creamed or fried vegetables. Vegetables in a . Regular canned vegetables. Regular canned tomato sauce and paste. Regular tomato and vegetable juices.  Fruits Dried fruits. Canned fruit in light or heavy syrup. Fruit juice.  Meat and Other Protein Products Meat in general - RED meat & White meat.  Fatty cuts of meat. Ribs, chicken wings, all processed meats as bacon, sausage, bologna, salami, fatback, hot dogs, bratwurst and packaged luncheon meats.  Dairy Whole or 2% milk, cream, half-and-half, and cream cheese. Whole-fat or sweetened yogurt. Full-fat cheeses or blue cheese. Non-dairy creamers and whipped toppings. Processed cheese, cheese spreads, or cheese curds.  Condiments Onion and garlic salt, seasoned salt, table salt, and sea salt. Canned and packaged gravies. Worcestershire sauce. Tartar sauce. Barbecue sauce. Teriyaki sauce. Soy sauce, including reduced sodium. Steak sauce. Fish sauce. Oyster sauce. Cocktail sauce. Horseradish. Ketchup and mustard. Meat flavorings and tenderizers. Bouillon cubes. Hot sauce. Tabasco sauce. Marinades. Taco seasonings. Relishes.  Fats and  Oils Butter, stick margarine, lard, shortening and bacon fat. Coconut, palm kernel, or palm oils. Regular salad dressings.  Pickles and olives. Salted popcorn and pretzels.  The items listed above may not be a complete list of foods and beverages to avoid.

## 2019-07-20 LAB — CBC WITH DIFFERENTIAL/PLATELET
Absolute Monocytes: 481 cells/uL (ref 200–950)
Basophils Absolute: 22 cells/uL (ref 0–200)
Basophils Relative: 0.4 %
Eosinophils Absolute: 232 cells/uL (ref 15–500)
Eosinophils Relative: 4.3 %
HCT: 39.1 % (ref 38.5–50.0)
Hemoglobin: 13.1 g/dL — ABNORMAL LOW (ref 13.2–17.1)
Lymphs Abs: 1836 cells/uL (ref 850–3900)
MCH: 30 pg (ref 27.0–33.0)
MCHC: 33.5 g/dL (ref 32.0–36.0)
MCV: 89.7 fL (ref 80.0–100.0)
MPV: 11.4 fL (ref 7.5–12.5)
Monocytes Relative: 8.9 %
Neutro Abs: 2830 cells/uL (ref 1500–7800)
Neutrophils Relative %: 52.4 %
Platelets: 131 10*3/uL — ABNORMAL LOW (ref 140–400)
RBC: 4.36 10*6/uL (ref 4.20–5.80)
RDW: 14 % (ref 11.0–15.0)
Total Lymphocyte: 34 %
WBC: 5.4 10*3/uL (ref 3.8–10.8)

## 2019-07-20 LAB — COMPLETE METABOLIC PANEL WITH GFR
AG Ratio: 1.6 (calc) (ref 1.0–2.5)
ALT: 18 U/L (ref 9–46)
AST: 27 U/L (ref 10–35)
Albumin: 4.1 g/dL (ref 3.6–5.1)
Alkaline phosphatase (APISO): 102 U/L (ref 35–144)
BUN/Creatinine Ratio: 10 (calc) (ref 6–22)
BUN: 15 mg/dL (ref 7–25)
CO2: 27 mmol/L (ref 20–32)
Calcium: 9.3 mg/dL (ref 8.6–10.3)
Chloride: 106 mmol/L (ref 98–110)
Creat: 1.51 mg/dL — ABNORMAL HIGH (ref 0.70–1.18)
GFR, Est African American: 53 mL/min/{1.73_m2} — ABNORMAL LOW (ref >=60)
GFR, Est Non African American: 46 mL/min/{1.73_m2} — ABNORMAL LOW (ref >=60)
Globulin: 2.5 g/dL (calc) (ref 1.9–3.7)
Glucose, Bld: 106 mg/dL — ABNORMAL HIGH (ref 65–99)
Potassium: 3.9 mmol/L (ref 3.5–5.3)
Sodium: 140 mmol/L (ref 135–146)
Total Bilirubin: 0.8 mg/dL (ref 0.2–1.2)
Total Protein: 6.6 g/dL (ref 6.1–8.1)

## 2019-07-20 LAB — LIPID PANEL
Cholesterol: 92 mg/dL (ref <200)
HDL: 43 mg/dL (ref >=40)
LDL Cholesterol (Calc): 32 mg/dL (calc)
Non-HDL Cholesterol (Calc): 49 mg/dL (calc) (ref <130)
Total CHOL/HDL Ratio: 2.1 (calc) (ref <5.0)
Triglycerides: 86 mg/dL (ref <150)

## 2019-07-20 LAB — IRON, TOTAL/TOTAL IRON BINDING CAP
%SAT: 25 % (calc) (ref 20–48)
Iron: 75 ug/dL (ref 50–180)
TIBC: 306 mcg/dL (calc) (ref 250–425)

## 2019-07-20 LAB — VITAMIN D 25 HYDROXY (VIT D DEFICIENCY, FRACTURES): Vit D, 25-Hydroxy: 67 ng/mL (ref 30–100)

## 2019-07-20 LAB — HEMOGLOBIN A1C
Hgb A1c MFr Bld: 5.7 % of total Hgb — ABNORMAL HIGH (ref <5.7)
Mean Plasma Glucose: 117 (calc)
eAG (mmol/L): 6.5 (calc)

## 2019-07-20 LAB — VITAMIN B12: Vitamin B-12: 370 pg/mL (ref 200–1100)

## 2019-07-20 LAB — MAGNESIUM: Magnesium: 2.2 mg/dL (ref 1.5–2.5)

## 2019-07-21 ENCOUNTER — Other Ambulatory Visit: Payer: Self-pay | Admitting: Adult Health Nurse Practitioner

## 2019-07-21 DIAGNOSIS — D649 Anemia, unspecified: Secondary | ICD-10-CM

## 2019-07-21 DIAGNOSIS — N1831 Chronic kidney disease, stage 3a: Secondary | ICD-10-CM

## 2019-08-01 ENCOUNTER — Ambulatory Visit (INDEPENDENT_AMBULATORY_CARE_PROVIDER_SITE_OTHER): Payer: Medicare Other | Admitting: Psychiatry

## 2019-08-01 ENCOUNTER — Other Ambulatory Visit: Payer: Self-pay

## 2019-08-01 DIAGNOSIS — F331 Major depressive disorder, recurrent, moderate: Secondary | ICD-10-CM | POA: Diagnosis not present

## 2019-08-01 NOTE — Progress Notes (Signed)
[facility] Counselor/Therapist Progress Note  Patient ID: Cody Folker., MRN: [id number],    Date: 08/01/2019  Time Spent: 60 minutes  9:00am to 10:00am  Treatment Type: Individual Therapy  Reported Symptoms:  Depression, anxiety (especially about future and medical issues)  Mental Status Exam:  Appearance:   Casual     Behavior:  Appropriate and Sharing  Motor:  Normal  Speech/Language:   Normal Rate  Affect:  Depressed and anxious  Mood:  anxious and depressed  Thought process:  goal directed  Thought content:    WNL  Sensory/Perceptual disturbances:    WNL  Orientation:  oriented to person, place, time/date, situation, day of week, month of year and year  Attention:  Good  Concentration:  Good  Memory:  reports some recent memory issues  Fund of knowledge:   Good  Insight:    Fair  Judgment:   Good  Impulse Control:  Good   Risk Assessment: Danger to Self:  No Self-injurious Behavior: No Danger to Others: No Duty to Warn:no Physical Aggression / Violence:No  Access to Firearms a concern: No  Gang Involvement:No   Subjective:  Patient in today with continued depression, anxiety, and some apprehension about the future, especially with his health concerns after recent fall off ladder.  He reports that neurologist has talked with him about possible injections for back, but he's not sure yet.  Pain is better some.  Not able to work at this time and not sure if he will be returning to part time work or not. Has appt with Dr. Arnoldo Morale in November.   Interventions: Solution-Oriented/Positive Psychology and Ego-Supportive  Diagnosis:   ICD-10-CM   1. Major depressive disorder, recurrent episode, moderate ([place])  F33.1      Plan: Patient is not signing tx goals on computer screen due to [place].  Treatment Goals: Goals remain on tx plan as patient works on strategies to achieve his goals.  Progress is documented each session in the "Progress" section of  Plan.   Long term goal: Develop healthy cognitive patterns and beliefs about self and the world that lead to alleviation and help prevent relapse of depression.  Short term goal: Identify and replace depressive thinking that leads to depressive feelings and actions.  He will replace the depressive thinking with more positive, hopeful thoughts that can lead to more positive and hopeful feelings.  Strategy: Patient to more consistently monitor his thoughts and catch the depressive/negative thoughts, trying to change them to positive and more reality-based thought patterns. Look at his negative thoughts and weigh them against the evidence.  PROGRESS: Patient is working on his goals a little more outside of sessions. Does better working on them in sessions and shared today that his thoughts most recently have been "fearing future some due to medical problems".  "But I'm optimistic in some ways, am hopeful if I get the back injections that they will help my back pain."  While in session we compared his more fearful/negative/anxious thoughts and looked at his resulting feelings which were depressed and anxious mood.  Also compared that with his occasional more optimistic thoughts and the feelings he has then, which were more positive and encouraging.  Patient admits it is hard for him to get and stay more optimistic and positive. Discussed ways of creating new habits that can help him become more consistently less negative and ways to maintain gains.  Encouraged him to self-monitor his thoughts and try to identify and  interrupt any negative/anxious/depressed thoughts, making the effort to replace them with more reality-based, positive, self-affirming thoughts, and particular notice any changes in his feelings as a result of changing his thoughts.  Will pick up on this next session.  Goal review and "positives" shared with patient.   Next visit within 3 weeks.   Shanon Ace,  LCSW

## 2019-08-10 ENCOUNTER — Other Ambulatory Visit: Payer: Self-pay

## 2019-08-10 ENCOUNTER — Other Ambulatory Visit: Payer: Medicare Other

## 2019-08-10 DIAGNOSIS — M546 Pain in thoracic spine: Secondary | ICD-10-CM | POA: Diagnosis not present

## 2019-08-10 DIAGNOSIS — N1831 Chronic kidney disease, stage 3a: Secondary | ICD-10-CM

## 2019-08-10 DIAGNOSIS — D649 Anemia, unspecified: Secondary | ICD-10-CM | POA: Diagnosis not present

## 2019-08-10 DIAGNOSIS — S22080D Wedge compression fracture of T11-T12 vertebra, subsequent encounter for fracture with routine healing: Secondary | ICD-10-CM | POA: Diagnosis not present

## 2019-08-11 LAB — BASIC METABOLIC PANEL WITH GFR
BUN/Creatinine Ratio: 8 (calc) (ref 6–22)
BUN: 11 mg/dL (ref 7–25)
CO2: 28 mmol/L (ref 20–32)
Calcium: 9.5 mg/dL (ref 8.6–10.3)
Chloride: 103 mmol/L (ref 98–110)
Creat: 1.43 mg/dL — ABNORMAL HIGH (ref 0.70–1.18)
GFR, Est African American: 57 mL/min/{1.73_m2} — ABNORMAL LOW (ref >=60)
GFR, Est Non African American: 49 mL/min/{1.73_m2} — ABNORMAL LOW (ref >=60)
Glucose, Bld: 105 mg/dL — ABNORMAL HIGH (ref 65–99)
Potassium: 4.1 mmol/L (ref 3.5–5.3)
Sodium: 139 mmol/L (ref 135–146)

## 2019-08-11 LAB — CBC WITH DIFFERENTIAL/PLATELET
Absolute Monocytes: 468 cells/uL (ref 200–950)
Basophils Absolute: 21 cells/uL (ref 0–200)
Basophils Relative: 0.4 %
Eosinophils Absolute: 109 cells/uL (ref 15–500)
Eosinophils Relative: 2.1 %
HCT: 39.4 % (ref 38.5–50.0)
Hemoglobin: 13.4 g/dL (ref 13.2–17.1)
Lymphs Abs: 1856 cells/uL (ref 850–3900)
MCH: 30.5 pg (ref 27.0–33.0)
MCHC: 34 g/dL (ref 32.0–36.0)
MCV: 89.5 fL (ref 80.0–100.0)
MPV: 10.9 fL (ref 7.5–12.5)
Monocytes Relative: 9 %
Neutro Abs: 2746 cells/uL (ref 1500–7800)
Neutrophils Relative %: 52.8 %
Platelets: 122 10*3/uL — ABNORMAL LOW (ref 140–400)
RBC: 4.4 10*6/uL (ref 4.20–5.80)
RDW: 14.1 % (ref 11.0–15.0)
Total Lymphocyte: 35.7 %
WBC: 5.2 10*3/uL (ref 3.8–10.8)

## 2019-08-25 ENCOUNTER — Other Ambulatory Visit: Payer: Self-pay

## 2019-08-25 ENCOUNTER — Ambulatory Visit (INDEPENDENT_AMBULATORY_CARE_PROVIDER_SITE_OTHER): Payer: Medicare Other | Admitting: Psychiatry

## 2019-08-25 DIAGNOSIS — F331 Major depressive disorder, recurrent, moderate: Secondary | ICD-10-CM
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
## 2019-08-30 DIAGNOSIS — Z6826 Body mass index (BMI) 26.0-26.9, adult: Secondary | ICD-10-CM | POA: Diagnosis not present

## 2019-08-30 DIAGNOSIS — S22080G Wedge compression fracture of T11-T12 vertebra, subsequent encounter for fracture with delayed healing: Secondary | ICD-10-CM | POA: Diagnosis not present

## 2019-08-30 DIAGNOSIS — S22080D Wedge compression fracture of T11-T12 vertebra, subsequent encounter for fracture with routine healing: Secondary | ICD-10-CM | POA: Diagnosis not present

## 2019-09-06 DIAGNOSIS — Z1159 Encounter for screening for other viral diseases: Secondary | ICD-10-CM | POA: Diagnosis not present

## 2019-09-06 HISTORY — PX: SPINE SURGERY: SHX786

## 2019-09-12 DIAGNOSIS — Y939 Activity, unspecified: Secondary | ICD-10-CM | POA: Diagnosis not present

## 2019-09-12 DIAGNOSIS — S22080G Wedge compression fracture of T11-T12 vertebra, subsequent encounter for fracture with delayed healing: Secondary | ICD-10-CM | POA: Diagnosis not present

## 2019-09-12 DIAGNOSIS — Y929 Unspecified place or not applicable: Secondary | ICD-10-CM | POA: Diagnosis not present

## 2019-09-12 DIAGNOSIS — Y999 Unspecified external cause status: Secondary | ICD-10-CM | POA: Diagnosis not present

## 2019-09-12 DIAGNOSIS — X58XXXD Exposure to other specified factors, subsequent encounter: Secondary | ICD-10-CM | POA: Diagnosis not present

## 2019-09-16 ENCOUNTER — Ambulatory Visit (INDEPENDENT_AMBULATORY_CARE_PROVIDER_SITE_OTHER): Payer: Medicare Other | Admitting: Psychiatry

## 2019-09-16 ENCOUNTER — Other Ambulatory Visit: Payer: Self-pay

## 2019-09-16 DIAGNOSIS — F331 Major depressive disorder, recurrent, moderate: Secondary | ICD-10-CM
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
## 2019-09-22 ENCOUNTER — Ambulatory Visit: Payer: Self-pay | Admitting: Adult Health

## 2019-09-26 DIAGNOSIS — R7303 Prediabetes: Secondary | ICD-10-CM | POA: Insufficient documentation

## 2019-09-26 DIAGNOSIS — E782 Mixed hyperlipidemia: Secondary | ICD-10-CM | POA: Insufficient documentation

## 2019-09-26 DIAGNOSIS — Z85828 Personal history of other malignant neoplasm of skin: Secondary | ICD-10-CM | POA: Insufficient documentation

## 2019-09-26 NOTE — Progress Notes (Signed)
MEDICARE ANNUAL WELLNESS VISIT AND FOLLOW UP Assessment:   Encounter for Medicare annual wellness exam  Essential hypertension - continue medications, DASH diet, exercise and monitor at home. Call if greater than 130/80.  -     CBC with Differential/Platelet -     CMP/GFR -     TSH  Hyperlipidemia -continue medications, check lipids, decrease fatty foods, increase activity.  -     Lipid panel  Prediabetes Discussed general issues about diabetes pathophysiology and management., Educational material distributed., Suggested low cholesterol diet., Encouraged aerobic exercise., Discussed foot care., Reminded to get yearly retinal exam. -     Hemoglobin A1c  Depression, major, in active ([place]) Continue follow up with psych/counseling, doing much better Continue medications  Lifestyle discussed: diet/exerise, sleep hygiene, stress management, hydration  OSA on CPAP Weight loss advised, continue CPAP  Gastroesophageal reflux disease, esophagitis presence not specified Continue PPI/H2 blocker, diet discussed  IBS (irritable bowel syndrome) Diet controlled  Fibromyalgia Increase exercise, healthy eating  BPH (benign prostatic hyperplasia) Continue to monitor  Vitamin D deficiency Continue supplement  Medication management -     Magnesium  CKD 3  Increase fluids, avoid NSAIDS, monitor sugars, will monitor  Compression fracture T12 S/p surgical correction by Dr. Arnoldo Morale 09/2019 and continues to follow  Reports pain is fairly managed by tylenol   Moderate fall risk R/t recent injury with fall; has completed PT Falls prevention reviewed; information provided; consider repeat PT next year  Dark stools Check CBC, stop or hold iron supplement as indicated, if dark stools persist after stopping will send hemoccult card Call if black/tarry stools  Personal history of skin cancer Continue follow up with dermatology    Over 30 minutes of exam, counseling, chart review, and  critical decision making was performed  Future Appointments  Date Time Provider [place]  10/17/2019  8:00 AM Shanon Ace, LCSW [facility] None  11/21/2019  9:00 AM Cottle, Billey Co., MD CP-[facility] None  11/22/2019  8:00 AM Shanon Ace, LCSW CP[facility] None  01/03/2020  9:00 AM [name], MD [facility] None  05/14/2020  9:00 AM Ward Givens, NP [facility] None     Plan:   During the course of the visit the patient was educated and counseled about appropriate screening and preventive services including:    Pneumococcal vaccine   Influenza vaccine  Prevnar 13  Td vaccine  Screening electrocardiogram  Colorectal cancer screening  Diabetes screening  Glaucoma screening  Nutrition counseling    Subjective:  Cody Hines. is a 73 y.o. male who presents for Medicare Annual Wellness Visit and 3 month follow up for HTN, hyperlipidemia, prediabetes, and vitamin D Def.   He has been separated from wife for nearly 2 years until recently, fell off of a ladder and had T12 compression fracture, underwent surgical correction by Dr. Arnoldo Morale early December 2020, moved back in with wife to avoid SNF.  He is seeing Dr. Charlott Holler at [facility] and a therapist this as well, he is on zoloft, and his depression is somewhat better, now doing therapy once a month with Hassan Rowan. Daughter is psychologist. He was on lorazepam PRN, somewhat increased since back fracture.   On CPAP for OSA , reports 100% compliance and that this is helping.   Had left knee injections by ortho.   He has personal history of skin cancer to nose and follows with dermatology.   BMI is Body mass index is 26.44 kg/m., he has not been working on diet  and exercise. Wt Readings from Last 3 Encounters:  09/27/19 200 lb 6.4 oz (90.9 kg)  07/19/19 204 lb (92.5 kg)  05/10/19 221 lb 12.8 oz (100.6 kg)   His blood pressure has been controlled at home, today their BP is BP: 118/62  He does not workout. He denies  chest pain, shortness of breath, dizziness.   He is on cholesterol medication and denies myalgias. His cholesterol is at goal. The cholesterol last visit was:   Lab Results  Component Value Date   CHOL 92 07/19/2019   HDL 43 07/19/2019   LDLCALC 32 07/19/2019   TRIG 86 07/19/2019   CHOLHDL 2.1 07/19/2019    He has been working on diet and exercise for prediabetes, and denies paresthesia of the feet, polydipsia, polyuria and visual disturbances. Last A1C in the office was:  Lab Results  Component Value Date   HGBA1C 5.7 (H) 07/19/2019   Last GFR Lab Results  Component Value Date   GFRNONAA 49 (L) 08/10/2019   Patient is on Vitamin D supplement.   Lab Results  Component Value Date   VD25OH 67 07/19/2019     He reports anemia earlier this year and has been on EZ iron supplement. He shares his stools have been dark; unsure if this started prior to or after iron supplement.  Lab Results  Component Value Date   WBC 5.2 08/10/2019   HGB 13.4 08/10/2019   HCT 39.4 08/10/2019   MCV 89.5 08/10/2019   PLT 122 (L) 08/10/2019   Lab Results  Component Value Date   IRON 75 07/19/2019   TIBC 306 07/19/2019     Medication Review: Current Outpatient Medications on File Prior to Visit  Medication Sig Dispense Refill  . acetaminophen (TYLENOL) 650 MG CR tablet Take 650 mg by mouth every 8 (eight) hours as needed for pain (or back pain or migraines).     Marland Kitchen aspirin EC 81 MG tablet Take 81 mg by mouth daily.    . Cholecalciferol (VITAMIN D3) 250 MCG (10000 UT) capsule Take 10,000 Units by mouth daily.    . enalapril (VASOTEC) 20 MG tablet Take 1 tablet Daily for BP (Patient taking differently: Take 20 mg by mouth daily. ) 90 tablet 3  . Flaxseed, Linseed, (FLAX SEED OIL) 1000 MG CAPS Take 2,000 mg by mouth daily.     Marland Kitchen LORazepam (ATIVAN) 1 MG tablet TAKE 1 TABLET BY MOUTH EVERY NIGHT AT BEDTIME 30 tablet 5  . Magnesium 500 MG TABS Take 500 mg by mouth daily.     . ondansetron (ZOFRAN  ODT) 4 MG disintegrating tablet Take 1 tablet (4 mg total) by mouth every 8 (eight) hours as needed for nausea. 10 tablet 0  . rosuvastatin (CRESTOR) 40 MG tablet Take 1 tablet Daily for Cholesterol 90 tablet 3  . sertraline (ZOLOFT) 100 MG tablet Take 2 tablets (200 mg total) by mouth daily. 180 tablet 1   No current facility-administered medications on file prior to visit.    Allergies: Allergies  Allergen Reactions  . Asa [Aspirin] Diarrhea, Nausea Only and Other (See Comments)    History of ulcers, also  . Atorvastatin Other (See Comments)    Muscle aches  . Celebrex [Celecoxib] Other (See Comments)    Headaches   . Codeine Nausea And Vomiting  . Savella [Milnacipran Hcl] Other (See Comments)    Reaction not recalled  . Viagra [Sildenafil Citrate] Other (See Comments)    Headaches  . Penicillins Rash and Other (  See Comments)    Current Problems (verified) has Essential hypertension; GERD (gastroesophageal reflux disease); Other abnormal glucose (prediabetes); IBS (irritable bowel syndrome); OSA on CPAP; Fibromyalgia; BPH (benign prostatic hyperplasia); Testosterone deficiency; Medication management; Vitamin D deficiency; Encounter for Medicare annual wellness exam; Overweight (BMI 25.0-29.9); Benign essential tremor; Chronic lumbar pain; Major depressive disorder, recurrent episode, moderate (HCC); CKD (chronic kidney disease) stage 3, GFR 30-59 ml/min; Prediabetes; Hyperlipidemia; and Personal history of skin cancer on their problem list.  Screening Tests Immunization History  Administered Date(s) Administered  . Pneumococcal Conjugate-13 09/17/2018  . Pneumococcal-Unspecified 07/16/2011  . Tdap 01/13/2010  . Zoster 08/09/2013   Preventative care: Last colonoscopy: 07/2013 due 2024 CXR 2009 CT AB 2009 Sleep study 06/2017  Prior vaccinations: TD or Tdap: 2011  Influenza: declines Pneumococcal: 2012, due out in office Prevnar13: 2019 Shingles/Zostavax: 2014  Names  of Other Physician/Practitioners you currently use: 1. [facility] here for primary care 2. Dr Katy Fitch, eye doctor, last visit  2020 3. Dr Radford Pax, [place] , dentist, last visit 2019  Patient Care Team: [name], MD as PCP - General (Internal Medicine) Druscilla Brownie, MD as Consulting Physician (Dermatology) Teena Irani, MD (Inactive) as Consulting Physician (Gastroenterology) Newman Pies, MD as Consulting Physician (Neurosurgery) Clent Jacks, MD as Consulting Physician (Ophthalmology)  Surgical: He  has a past surgical history that includes Spine surgery (2007); Knee arthroscopy (Left, 1999);  nasal smr np3 (1985); Vasectomy (1983); Skin cancer excision (2020); and Spine surgery (09/2019). Family His family history includes Arthritis in his sister; Cancer in his father and mother; Diabetes in his father; Heart disease in his sister. Social history  He reports that he quit smoking about 39 years ago. His smoking use included cigarettes. He quit after 30.00 years of use. He has never used smokeless tobacco. He reports that he does not drink alcohol or use drugs.  MEDICARE WELLNESS OBJECTIVES: Physical activity: Exercise limited by: orthopedic condition(s) Cardiac risk factors: Cardiac Risk Factors include: advanced age (>59men, >52 women);hypertension;male gender;obesity (BMI >30kg/m2);dyslipidemia;sedentary lifestyle;smoking/ tobacco exposure Depression/mood screen:   Depression screen [facility] 2/9 09/27/2019  Decreased Interest 0  Down, Depressed, Hopeless 1  PHQ - 2 Score 1  Altered sleeping -  Tired, decreased energy -  Change in appetite -  Feeling bad or failure about yourself  -  Trouble concentrating -  Moving slowly or fidgety/restless -  Suicidal thoughts -  PHQ-9 Score -  Difficult doing work/chores -    ADLs:  In your present state of health, do you have any difficulty performing the following activities: 09/27/2019  06/26/2019  Hearing? Y N  Comment needs hearing test -  Vision? N N  Difficulty concentrating or making decisions? N N  Walking or climbing stairs? N N  Dressing or bathing? N N  Doing errands, shopping? N N  Some recent data might be hidden     Cognitive Testing  Alert? Yes  Normal Appearance?Yes  Oriented to person? Yes  Place? Yes   Time? Yes  Recall of three objects?  Yes  Can perform simple calculations? Yes  Displays appropriate judgment?Yes  Can read the correct time from a watch face?Yes  EOL planning: Does Patient Have a Medical Advance Directive?: Yes Type of Advance Directive: Healthcare Power of Attorney, Living will Does patient want to make changes to medical advance directive?: No - Patient declined Copy of [place] in Chart?: No - copy requested   Objective:   Today's Vitals  09/27/19 0852  BP: 118/62  Pulse: 66  Temp: 97.9 F (36.6 C)  SpO2: 97%  Weight: 200 lb 6.4 oz (90.9 kg)  Height: 6\' 1"  (1.854 m)   Body mass index is 26.44 kg/m.  General appearance: alert, no distress, WD/WN, male HEENT: normocephalic, sclerae anicteric, TMs pearly, nares patent, no discharge or erythema, pharynx normal Oral cavity: MMM, no lesions Neck: supple, no lymphadenopathy, no thyromegaly, no masses Heart: RRR, normal S1, S2, no murmurs Lungs: CTA bilaterally, no wheezes, rhonchi, or rales Abdomen: +bs, soft, non tender, non distended, no masses, no hepatomegaly, no splenomegaly Musculoskeletal: nontender, no swelling, no obvious deformity Extremities: no edema, no cyanosis, no clubbing Pulses: 2+ symmetric, upper and lower extremities, normal cap refill Neurological: alert, oriented x 3, CN2-12 intact, strength normal upper extremities and lower extremities, sensation normal throughout, DTRs 2+ throughout, no cerebellar signs, gait normal. Has chronic resting tremor of LUE.  Psychiatric: depressed affect, behavior normal, pleasant   Medicare  Attestation I have personally reviewed: The patient's medical and social history Their use of alcohol, tobacco or illicit drugs Their current medications and supplements The patient's functional ability including ADLs,fall risks, home safety risks, cognitive, and hearing and visual impairment Diet and physical activities Evidence for depression or mood disorders  The patient's weight, height, BMI, and visual acuity have been recorded in the chart.  I have made referrals, counseling, and provided education to the patient based on review of the above and I have provided the patient with a written personalized care plan for preventive services.     Izora Ribas, NP   09/27/2019

## 2019-09-27 ENCOUNTER — Encounter: Payer: Self-pay | Admitting: Adult Health

## 2019-09-27 ENCOUNTER — Ambulatory Visit (INDEPENDENT_AMBULATORY_CARE_PROVIDER_SITE_OTHER): Payer: Medicare Other | Admitting: Adult Health

## 2019-09-27 ENCOUNTER — Other Ambulatory Visit: Payer: Self-pay

## 2019-09-27 VITALS — BP 118/62 | HR 66 | Temp 97.9°F | Ht 73.0 in | Wt 200.4 lb

## 2019-09-27 DIAGNOSIS — N401 Enlarged prostate with lower urinary tract symptoms: Secondary | ICD-10-CM

## 2019-09-27 DIAGNOSIS — Z79899 Other long term (current) drug therapy: Secondary | ICD-10-CM

## 2019-09-27 DIAGNOSIS — R7309 Other abnormal glucose: Secondary | ICD-10-CM

## 2019-09-27 DIAGNOSIS — R6889 Other general symptoms and signs: Secondary | ICD-10-CM | POA: Diagnosis not present

## 2019-09-27 DIAGNOSIS — E559 Vitamin D deficiency, unspecified: Secondary | ICD-10-CM

## 2019-09-27 DIAGNOSIS — S22080S Wedge compression fracture of T11-T12 vertebra, sequela: Secondary | ICD-10-CM

## 2019-09-27 DIAGNOSIS — M797 Fibromyalgia: Secondary | ICD-10-CM

## 2019-09-27 DIAGNOSIS — N1831 Chronic kidney disease, stage 3a: Secondary | ICD-10-CM | POA: Diagnosis not present

## 2019-09-27 DIAGNOSIS — R7303 Prediabetes: Secondary | ICD-10-CM

## 2019-09-27 DIAGNOSIS — G4733 Obstructive sleep apnea (adult) (pediatric): Secondary | ICD-10-CM

## 2019-09-27 DIAGNOSIS — K219 Gastro-esophageal reflux disease without esophagitis: Secondary | ICD-10-CM | POA: Diagnosis not present

## 2019-09-27 DIAGNOSIS — S22080A Wedge compression fracture of T11-T12 vertebra, initial encounter for closed fracture: Secondary | ICD-10-CM | POA: Insufficient documentation

## 2019-09-27 DIAGNOSIS — Z0001 Encounter for general adult medical examination with abnormal findings: Secondary | ICD-10-CM

## 2019-09-27 DIAGNOSIS — G250 Essential tremor: Secondary | ICD-10-CM

## 2019-09-27 DIAGNOSIS — E785 Hyperlipidemia, unspecified: Secondary | ICD-10-CM

## 2019-09-27 DIAGNOSIS — E663 Overweight: Secondary | ICD-10-CM

## 2019-09-27 DIAGNOSIS — R350 Frequency of micturition: Secondary | ICD-10-CM

## 2019-09-27 DIAGNOSIS — Z9181 History of falling: Secondary | ICD-10-CM | POA: Insufficient documentation

## 2019-09-27 DIAGNOSIS — Z85828 Personal history of other malignant neoplasm of skin: Secondary | ICD-10-CM

## 2019-09-27 DIAGNOSIS — K589 Irritable bowel syndrome without diarrhea: Secondary | ICD-10-CM

## 2019-09-27 DIAGNOSIS — F331 Major depressive disorder, recurrent, moderate: Secondary | ICD-10-CM

## 2019-09-27 DIAGNOSIS — I10 Essential (primary) hypertension: Secondary | ICD-10-CM

## 2019-09-27 DIAGNOSIS — E349 Endocrine disorder, unspecified: Secondary | ICD-10-CM

## 2019-09-27 DIAGNOSIS — G8929 Other chronic pain: Secondary | ICD-10-CM

## 2019-09-27 DIAGNOSIS — M545 Low back pain: Secondary | ICD-10-CM

## 2019-09-27 DIAGNOSIS — Z0000 Encounter for general adult medical examination without abnormal findings: Secondary | ICD-10-CM

## 2019-09-27 NOTE — Patient Instructions (Addendum)
Mr. Cody Hines , Thank you for taking time to come for your Medicare Wellness Visit. I appreciate your ongoing commitment to your health goals. Please review the following plan we discussed and let me know if I can assist you in the future.   These are the goals we discussed: Goals    . Blood Pressure < 140/90    . Exercise 3x per week (20 min per time)    . HEMOGLOBIN A1C < 5.7       This is a list of the screening recommended for you and due dates:  Health Maintenance  Topic Date Due  . Pneumonia vaccines (2 of 2 - PPSV23) 09/18/2019  . Flu Shot  01/04/2020*  . Tetanus Vaccine  01/14/2020  . Colon Cancer Screening  04/23/2023  .  Hepatitis C: One time screening is recommended by Center for Disease Control  (CDC) for  adults born from [birth year] through 1963.   Completed  *Topic was postponed. The date shown is not the original due date.       Understanding Your Risk for Falls Each year, millions of people suffer serious injuries from falls. It is important to understand your risk for falling. Talk with your health care provider about your risk and what you can do to lower it. There are actions you can take at home to lower your risk. If you do have a serious fall, it is important to tell your health care provider. Falling once raises your risk for falling again. How can falls affect me? Serious injuries from falls are common. These include:  Broken bones. Most hip fractures are caused by falls.  Traumatic brain injury (TBI). Falls are the most common cause of TBI. Fear of falling can also cause you to avoid activities and stay at home. This can make your muscles weaker and actually raise your risk for a fall. What can increase my risk? Serious injuries from a fall most often happen to people older than age 49. Children and young adults ages 19-29 are also at higher risk. The more risk factors you have for falling, the higher your risk. Risk factors include:  Weakness in the lower  body.  Lack (deficiency) of vitamin D.  Weak bones (osteoporosis).  Being generally weak or confused due to long-term (chronic) illness.  Dizziness or balance problems.  Poor vision.  Having depression.  Medicine that causes dizziness or drowsiness. These can include medicines for your blood pressure, heart, anxiety, insomnia, or edema, as well as pain medicines and muscle relaxants.  Drinking alcohol.  Foot pain or improper footwear.  Working at a dangerous job.  Having had a fall in the past.  Tripping hazards at home, such as floor clutter or loose rugs, or poor lighting.  Having pets or clutter in your home. What actions can I take to lower my risk of falling?      Maintain physical fitness: ? Do strength and balance exercises. Consider taking a regular class to build strength and balance. Yoga and tai chi are good options. ? Have your eyes checked every year and your vision prescription updated as needed.  Remove all clutter from walkways and stairways, including extension cords.  Use a cordless phone.  Do not use throw rugs. Make sure all carpeting is taped or tacked down securely.  Use good lighting in all rooms. Keep a flashlight near your bed.  Make sure there is a clear path from your bed to the bathroom. Use night-lights.  Install  grab bars for your tub, shower, and toilet. Use a bath mat in your tub or shower.  Attach secure railings on both sides of your stairs.  Repair uneven or broken steps.  Use a cane or walker as directed by your health care provider.  Wear nonskid shoes. Do not wear high heels. Do not walk around the house in socks or slippers.  Avoid walking on icy or slippery surfaces. Walk on the grass instead of on icy or slick sidewalks. Where you can, use ice melt to get rid of ice on walkways. Questions to ask your health care provider  Can you help me evaluate my risk for a fall?  Do any of my medicines make me more likely to  fall?  Should I take a vitamin D supplement?  What exercises can I do to improve my strength and balance?  Should I make an appointment to have my vision checked?  Do I need a bone density test to check for osteoporosis?  Would it help to use a cane or a walker? Where to find more information  Centers for Disease Control and Prevention, STEADI: StoreMirror.com.cy  Community-Based Fall Prevention Programs: StoreMirror.com.cy  [employer] on Aging: ToneConnect.com.ee Contact a health care provider if:  You fall at home.  You are afraid of falling at home.  You feel weak, drowsy, or dizzy at home. Summary  People 7 and older are at high risk for falling. However, older people are not the only ones injured in falls. Children and young adults have a higher-than-normal risk, too.  Talk with your health care provider about your risks for falling and how to lower those risks.  Taking certain precautions at home can lower your risk for falling.  If you fall, always tell your health care provider. This information is not intended to replace advice given to you by your health care provider. Make sure you discuss any questions you have with your health care provider. Document Released: 05/06/2017 Document Revised: 12/22/2017 Document Reviewed: 05/06/2017 Elsevier Patient Education  2020 [employer].

## 2019-09-28 LAB — COMPLETE METABOLIC PANEL WITH GFR
AG Ratio: 1.4 (calc) (ref 1.0–2.5)
ALT: 21 U/L (ref 9–46)
AST: 31 U/L (ref 10–35)
Albumin: 3.8 g/dL (ref 3.6–5.1)
Alkaline phosphatase (APISO): 150 U/L — ABNORMAL HIGH (ref 35–144)
BUN/Creatinine Ratio: 9 (calc) (ref 6–22)
BUN: 12 mg/dL (ref 7–25)
CO2: 25 mmol/L (ref 20–32)
Calcium: 9 mg/dL (ref 8.6–10.3)
Chloride: 103 mmol/L (ref 98–110)
Creat: 1.41 mg/dL — ABNORMAL HIGH (ref 0.70–1.18)
GFR, Est African American: 58 mL/min/{1.73_m2} — ABNORMAL LOW (ref >=60)
GFR, Est Non African American: 50 mL/min/{1.73_m2} — ABNORMAL LOW (ref >=60)
Globulin: 2.8 g/dL (calc) (ref 1.9–3.7)
Glucose, Bld: 127 mg/dL — ABNORMAL HIGH (ref 65–99)
Potassium: 4.1 mmol/L (ref 3.5–5.3)
Sodium: 138 mmol/L (ref 135–146)
Total Bilirubin: 1.2 mg/dL (ref 0.2–1.2)
Total Protein: 6.6 g/dL (ref 6.1–8.1)

## 2019-09-28 LAB — CBC WITH DIFFERENTIAL/PLATELET
Absolute Monocytes: 884 cells/uL (ref 200–950)
Basophils Absolute: 19 cells/uL (ref 0–200)
Basophils Relative: 0.2 %
Eosinophils Absolute: 133 cells/uL (ref 15–500)
Eosinophils Relative: 1.4 %
HCT: 39.3 % (ref 38.5–50.0)
Hemoglobin: 13.1 g/dL — ABNORMAL LOW (ref 13.2–17.1)
Lymphs Abs: 1720 cells/uL (ref 850–3900)
MCH: 30.4 pg (ref 27.0–33.0)
MCHC: 33.3 g/dL (ref 32.0–36.0)
MCV: 91.2 fL (ref 80.0–100.0)
MPV: 10.6 fL (ref 7.5–12.5)
Monocytes Relative: 9.3 %
Neutro Abs: 6745 cells/uL (ref 1500–7800)
Neutrophils Relative %: 71 %
Platelets: 196 10*3/uL (ref 140–400)
RBC: 4.31 10*6/uL (ref 4.20–5.80)
RDW: 13.3 % (ref 11.0–15.0)
Total Lymphocyte: 18.1 %
WBC: 9.5 10*3/uL (ref 3.8–10.8)

## 2019-09-28 LAB — MAGNESIUM: Magnesium: 2.2 mg/dL (ref 1.5–2.5)

## 2019-10-07 HISTORY — PX: CATARACT EXTRACTION, BILATERAL: SHX1313

## 2019-10-17 ENCOUNTER — Ambulatory Visit (INDEPENDENT_AMBULATORY_CARE_PROVIDER_SITE_OTHER): Payer: Medicare Other | Admitting: Psychiatry

## 2019-10-17 ENCOUNTER — Other Ambulatory Visit: Payer: Self-pay

## 2019-10-17 DIAGNOSIS — F411 Generalized anxiety disorder: Secondary | ICD-10-CM

## 2019-10-17 NOTE — Progress Notes (Signed)
[facility] Counselor/Therapist Progress Note  Patient ID: Cody Biggins., MRN: [id number],    Date: 10/17/2019  Time Spent: 60 minutes   8:00am to 9:00am  Treatment Type: Individual Therapy  Reported Symptoms: anxiety, some depression, anger, frustration. Pulling up things from past and rehashing, some loneliness  Mental Status Exam:  Appearance:   Casual     Behavior:  Appropriate and Sharing  Motor:  Normal  Speech/Language:   Normal Rate  Affect:  Anxious, somd depression  Mood:  anxious and depressed  Thought process:  goal directed  Thought content:    WNL  Sensory/Perceptual disturbances:    WNL  Orientation:  oriented to person, place, time/date, situation, day of week, month of year and year  Attention:  Good  Concentration:  Good  Memory:  some forgetfulness, worse under stress  Fund of knowledge:   Good  Insight:    Sometimes good, but Fair when under stress  Judgment:   Good  Impulse Control:  Good   Risk Assessment: Danger to Self:  No Self-injurious Behavior: No Danger to Others: No Duty to Warn:no Physical Aggression / Violence:No  Access to Firearms a concern: No  Gang Involvement:No   Subjective:  Patient in today with anxiety, frustrated, and angry.  Lots of anger and frustration re: political issues and election results.  Also frustrated with issue with son in reference to grandchildren.  Tendency to drag some things up from past and needed some soft re-direction at times to focus on goals.    Interventions: Cognitive Behavioral Therapy and Solution-Oriented/Positive Psychology  Diagnosis:   ICD-10-CM   1. Generalized anxiety disorder  F41.1      Plan: Patient is not signing tx goals on computer screen due to [place].  Treatment Goals: Goals remain on tx plan as patient works on strategies to achieve his goals. Progress is documented each session in the "Progress" section of Plan.   Long term goal: Develop healthy cognitive  patterns and beliefs about self and the world that lead to alleviation and help prevent relapse of depression.  Short term goal: Identify and replace depressive thinking that leads to depressive feelings and actions.He will replace the depressive thinking with more positive, hopeful thoughts that can lead to more positive and hopeful feelings.  Strategy: Patient to more consistently monitor his thoughts and catch the depressive/negative thoughts, trying to change them to positiveand more reality-based thought patterns. Look at his negative thoughts and weigh them against the evidence.  PROGRESS:  Patient continues work on his goals, which seems to have helped him to be able to have moved back in with wife and has continued to live there 3-4 months. Some conflicting issues but they seem to find there way through it.  Reports he is still working on intercepting his depressive/anxious/negative thoughts and trying to replace them with more positive, reality-based, empowering thoughts. Also working to increase positive self-talk.  Able to easily do this in session, but more difficult at home on his own. Does have some issues with wife at home when she asks him "to pick up after myself more" and he feels that he does. Still reports some forgetfulness and plans again to mention this to his PCP at an appt coming up soon this month per patient.  He is to also ask his PCP about getting his Covic vaccine.  Is still not working his part time job at [employer] and doesn't think he'll go back after having had back  injection.  Isn't quite as agile, steady on feet, and stamina has decreased. Doesn't seem bothered by thoughts of not going back to work.  Talked almost non-stop today, and he admitted that he does experience some loneliness and looked at different people in his life that he can reach out to more.  Goal review and progress/challenges noted with patient.  Next appt within 3-4 weeks.   Shanon Ace,  LCSW

## 2019-11-09 ENCOUNTER — Other Ambulatory Visit: Payer: Self-pay

## 2019-11-09 ENCOUNTER — Ambulatory Visit (INDEPENDENT_AMBULATORY_CARE_PROVIDER_SITE_OTHER): Payer: Medicare Other | Admitting: Psychiatry

## 2019-11-09 DIAGNOSIS — F331 Major depressive disorder, recurrent, moderate: Secondary | ICD-10-CM

## 2019-11-09 NOTE — Progress Notes (Signed)
[facility] Counselor/Therapist Progress Note  Patient ID: Cody Wool., MRN: [id number],    Date: 11/09/2019  Time Spent: 60 minutes  9:00am to 10:00am  Treatment Type: Individual Therapy  Reported Symptoms: anxiety, depression  Mental Status Exam:  Appearance:   Casual     Behavior:  Appropriate and Sharing  Motor:  Normal  Speech/Language:   Normal Rate  Affect:  anxious, depressed  Mood:  anxious and depressed  Thought process:  normal  Thought content:    WNL  Sensory/Perceptual disturbances:    WNL  Orientation:  oriented to person, place, time/date, situation, day of week, month of year and year  Attention:  Good  Concentration:  sometimes "have senior moments" and tune-out  Memory:  "some forgetfulness"  Fund of knowledge:   Good  Insight:    Good  Judgment:   Good  Impulse Control:  Good   Risk Assessment: Danger to Self:  No Self-injurious Behavior: No Danger to Others: No Duty to Warn:no Physical Aggression / Violence:No  Access to Firearms a concern: No  Gang Involvement:No   Subjective: Patient in today reporting depression is stronger than his anxiety. Does feel that he is coping some better.  Still living back with wife and that is "going ok for most part, but definitely some ups and downs." Reports no new meds nor health conditions since last appt.  Interventions: Cognitive Behavioral Therapy and Solution-Oriented/Positive Psychology  Diagnosis:   ICD-10-CM   1. Major depressive disorder, recurrent episode, moderate ([place])  F33.1     Plan: Patient is not signing tx goals on computer screen due to [place].  Treatment Goals: Goals remain on tx plan as patient works on strategies to achieve his goals. Progress is documented each session in the "Progress" section of Plan.   Long term goal: Develop healthy cognitive patterns and beliefs about self and the world that lead to alleviation and help prevent relapse of  depression.  Short term goal: Identify and replace depressive thinking that leads to depressive feelings and actions.He will replace the depressive thinking with more positive, hopeful thoughts that can lead to more positive and hopeful feelings.  Strategy: Patient to more consistently monitor his thoughts and catch the depressive/negative thoughts, trying to change them to positiveand more reality-based thought patterns. Look at his negative thoughts and weigh them against the evidence.  PROGRESS: Patient continues to make some progress on his goals. Is still living back with (previously separated) wife.  Still some differences in how they see things, and seem to be trying to take each day as it comes and move forward. Were separated 2 1/2 yrs and have been living back together for past 5 months. Some negativedepressive/anxious  thinking surfacing today in session.  Reviewed the process for him intercepting his depressive/anxious/negative thoughts and replacing them with more positive, reality-based, and empowering thoughts that do not support depression nor anxiety.  Still hard for him to listen/hear people whose view may be different from his own, low tolerance for suggestions of others even when they're trying to help.  Is to be called back for his Covid-19 vaccine shot, hoping sometime later this month.  Hoping to return to his part time job at some point. States he is feeling some more steady on his feet "but not totally back to normal, and when I'm tired I can be clumsy so have to be careful."  Encouraged him to continue work mentioned above on his anxious/negative/depressive thoughts (automatic) and  for him to stay in touch with other family and contacts.  Goal review and progress/challenges/efforst noted with patient.  Next appt within 2-3 weeks.   Shanon Ace, LCSW

## 2019-11-19 ENCOUNTER — Ambulatory Visit: Payer: Medicare Other | Attending: Internal Medicine

## 2019-11-19 DIAGNOSIS — Z23 Encounter for immunization: Secondary | ICD-10-CM | POA: Insufficient documentation

## 2019-11-19 NOTE — Progress Notes (Signed)
   [id number]  Name:  Cody Hines.    MRN: [id number] DOB: 1945-11-05  11/19/2019  Cody Hines was observed post Covid-19 immunization for 15 minutes without incidence. He was provided with Vaccine Information Sheet and instruction to access the V-Safe system.   Cody Hines was instructed to call 911 with any severe reactions post vaccine: Marland Kitchen Difficulty breathing  . Swelling of your face and throat  . A fast heartbeat  . A bad rash all over your body  . Dizziness and weakness    Immunizations Administered    Name Date Dose VIS Date Route   Pfizer COVID-19 Vaccine 11/19/2019 12:52 PM 0.3 mL 09/16/2019 Intramuscular   Manufacturer: [place]   Lot: [id number]   [place]: [id number]

## 2019-11-21 ENCOUNTER — Encounter: Payer: Self-pay | Admitting: Psychiatry

## 2019-11-21 ENCOUNTER — Ambulatory Visit (INDEPENDENT_AMBULATORY_CARE_PROVIDER_SITE_OTHER): Payer: Medicare Other | Admitting: Psychiatry

## 2019-11-21 ENCOUNTER — Other Ambulatory Visit: Payer: Self-pay

## 2019-11-21 DIAGNOSIS — F411 Generalized anxiety disorder: Secondary | ICD-10-CM

## 2019-11-21 DIAGNOSIS — F331 Major depressive disorder, recurrent, moderate: Secondary | ICD-10-CM | POA: Diagnosis not present

## 2019-11-21 DIAGNOSIS — F1021 Alcohol dependence, in remission: Secondary | ICD-10-CM | POA: Diagnosis not present

## 2019-11-21 DIAGNOSIS — F6381 Intermittent explosive disorder: Secondary | ICD-10-CM

## 2019-11-21 DIAGNOSIS — F5105 Insomnia due to other mental disorder: Secondary | ICD-10-CM

## 2019-11-21 MED ORDER — LORAZEPAM 1 MG PO TABS: 1.0000 mg | ORAL_TABLET | Freq: Every day | ORAL | 5 refills | Status: DC

## 2019-11-21 NOTE — Progress Notes (Signed)
Kaileb Perdomo [id number] [birth year] 74 y.o.  Subjective:   Patient ID:  Redmond P Matviy Soranno. is a 74 y.o. (DOB 1946/02/21) male.  Chief Complaint:  Chief Complaint  Patient presents with  . Follow-up    Medication Management  . Depression    Medication Management    Depression        Associated symptoms include headaches.  Associated symptoms include no decreased concentration and no suicidal ideas.   Brae P Jacorius Paget. presents to the office today for follow-up mood problems.  Last visit August 2020.  No med changes at last visit.  Still on sertraline 200 mg daily.   He's continued counseling.  Pretty good.  Trying to work on not hanging on to things.  Fell off ladder while fixing downspout at wife's parents house and fx vertebra and wife took him back to care for him.  Seems like it's gone OK overall back with wife.  95% of time trying to get along with him.  Not losing his temper with her generally.  Overall satisfied over the situation.  He's trying to help wife with loss of her parents and the estate.  Able to see gkids more generally except for Covid with son.  Pt been vaccinated but still not good enough for son to see the gkids.     Overall is improved but sx are not resolved. Pt reports that mood is Depressed and describes anxiety as still Moderate. Anxiety symptoms include: Excessive Worry,. Anxiety in large groups of people has to get out.  Pt reports has interrupted sleep. Better sleep when not working. Goes to bed early. Trouble staying asleep.  Busy.  sUsually enough sleep.  Pt reports that appetite is good. Pt reports that energy is poor DT heat. Better than it was.   Concentration is good. Suicidal thoughts:  denied by patient. Trying to stay active helps. Built dog fence. Less angry and resentful of others than he used to be. Outbursts are generally resolved. He believes he's handling anger better.  Wife not sure about it.   Drinks caffeinated sodas all day  until 9pm.  Sober 3 years.  Past Psychiatric Medication Trials: citalopram, Wellbutrin, sertraline 200,  Xanax.  Review of Systems:  Review of Systems  Respiratory: Negative for cough.   Musculoskeletal: Positive for back pain.  Neurological: Positive for tremors and headaches. Negative for weakness.  Psychiatric/Behavioral: Positive for depression. Negative for agitation, behavioral problems, confusion, decreased concentration, dysphoric mood, hallucinations, self-injury, sleep disturbance and suicidal ideas. The patient is nervous/anxious. The patient is not hyperactive.   Increase in migraine HA.  Medications: I have reviewed the patient's current medications.  Current Outpatient Medications  Medication Sig Dispense Refill  . acetaminophen (TYLENOL) 650 MG CR tablet Take 650 mg by mouth every 8 (eight) hours as needed for pain (or back pain or migraines).     Marland Kitchen aspirin EC 81 MG tablet Take 81 mg by mouth daily.    . Cholecalciferol (VITAMIN D3) 250 MCG (10000 UT) capsule Take 10,000 Units by mouth daily.    . enalapril (VASOTEC) 20 MG tablet Take 1 tablet Daily for BP (Patient taking differently: Take 20 mg by mouth daily. ) 90 tablet 3  . Flaxseed, Linseed, (FLAX SEED OIL) 1000 MG CAPS Take 2,000 mg by mouth daily.     Marland Kitchen LORazepam (ATIVAN) 1 MG tablet TAKE 1 TABLET BY MOUTH EVERY NIGHT AT BEDTIME 30 tablet 5  . Magnesium 500 MG TABS Take 500  mg by mouth daily.     . ondansetron (ZOFRAN ODT) 4 MG disintegrating tablet Take 1 tablet (4 mg total) by mouth every 8 (eight) hours as needed for nausea. 10 tablet 0  . rosuvastatin (CRESTOR) 40 MG tablet Take 1 tablet Daily for Cholesterol 90 tablet 3  . sertraline (ZOLOFT) 100 MG tablet Take 2 tablets (200 mg total) by mouth daily. 180 tablet 1   No current facility-administered medications for this visit.    Medication Side Effects: None  Allergies:  Allergies  Allergen Reactions  . Asa [Aspirin] Diarrhea, Nausea Only and Other (See  Comments)    History of ulcers, also  . Atorvastatin Other (See Comments)    Muscle aches  . Celebrex [Celecoxib] Other (See Comments)    Headaches   . Codeine Nausea And Vomiting  . Savella [Milnacipran Hcl] Other (See Comments)    Reaction not recalled  . Viagra [Sildenafil Citrate] Other (See Comments)    Headaches  . Penicillins Rash and Other (See Comments)    Past Medical History:  Diagnosis Date  . BPH (benign prostatic hyperplasia)   . Cancer (HCC)    nose, skin cancer  . Elevated hemoglobin A1c   . Fibromyalgia   . GERD (gastroesophageal reflux disease)   . Hyperlipidemia   . Hypertension   . Hypogonadism male   . IBS (irritable bowel syndrome)   . OSA (obstructive sleep apnea)     Family History  Problem Relation Age of Onset  . Cancer Mother        breast  . Cancer Father        lung  . Diabetes Father   . Heart disease Sister   . Arthritis Sister     Social History   Socioeconomic History  . Marital status: Legally Separated    Spouse name: Kennyth Lose  . Number of children: Not on file  . Years of education: Not on file  . Highest education level: Not on file  Occupational History  . 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 Smoking status: Former Smoker    Years: 30.00    Types: Cigarettes    Quit date: 10/06/1980    Years since quitting: 39.1  . Smokeless tobacco: Never Used  Substance and Sexual Activity  . Alcohol use: No    Comment: Not drinking x 1 month  . Drug use: No  . Sexual activity: Not Currently  Other Topics Concern  . Not on file  Social History Narrative  . Not on file   Social Determinants of Health   Financial Resource Strain:   . Difficulty of Paying Living Expenses: Not on file  Food Insecurity:   . Worried About Charity fundraiser in the Last Year: Not on file  . Ran Out of Food in the Last Year: Not on file  Transportation Needs:   . Lack of Transportation (Medical): Not on file  . Lack of Transportation  (Non-Medical): Not on file  Physical Activity:   . Days of Exercise per Week: Not on file  . Minutes of Exercise per Session: Not on file  Stress:   . Feeling of Stress : Not on file  Social Connections:   . Frequency of Communication with Friends and Family: Not on file  . Frequency of Social Gatherings with Friends and Family: Not on file  . Attends Religious Services: Not on file  . Active Member of Clubs or Organizations: Not on file  . Attends Club  or Organization Meetings: Not on file  . Marital Status: Not on file  Intimate Partner Violence:   . Fear of Current or Ex-Partner: Not on file  . Emotionally Abused: Not on file  . Physically Abused: Not on file  . Sexually Abused: Not on file    Past Medical History, Surgical history, Social history, and Family history were reviewed and updated as appropriate.   Please see review of systems for further details on the patient's review from today.   Objective:   Physical Exam:  There were no vitals taken for this visit.  Physical Exam Constitutional:      General: He is not in acute distress.    Appearance: He is well-developed.  Musculoskeletal:        General: No deformity.  Neurological:     Mental Status: He is alert and oriented to person, place, and time.     Motor: No tremor.     Coordination: Coordination normal.     Gait: Gait normal.  Psychiatric:        Attention and Perception: He is attentive. He does not perceive auditory hallucinations.        Mood and Affect: Mood is not anxious or depressed. Affect is not labile, blunt, angry or inappropriate.        Speech: Speech normal.        Behavior: Behavior normal.        Thought Content: Thought content normal. Thought content is not paranoid. Thought content does not include homicidal or suicidal ideation. Thought content does not include homicidal or suicidal plan.        Cognition and Memory: Cognition normal.        Judgment: Judgment normal.     Comments:  Insight intact. No auditory or visual hallucinations.  Overall less irritable and angry with people. Less blunted, more humor. Talkative.     Lab Review:     Component Value Date/Time   NA 138 09/27/2019 0931   K 4.1 09/27/2019 0931   CL 103 09/27/2019 0931   CO2 25 09/27/2019 0931   GLUCOSE 127 (H) 09/27/2019 0931   BUN 12 09/27/2019 0931   CREATININE 1.41 (H) 09/27/2019 0931   CALCIUM 9.0 09/27/2019 0931   PROT 6.6 09/27/2019 0931   ALBUMIN 3.9 06/22/2019 2128   AST 31 09/27/2019 0931   ALT 21 09/27/2019 0931   ALKPHOS 93 06/22/2019 2128   BILITOT 1.2 09/27/2019 0931   GFRNONAA 50 (L) 09/27/2019 0931   GFRAA 58 (L) 09/27/2019 0931       Component Value Date/Time   WBC 9.5 09/27/2019 0931   RBC 4.31 09/27/2019 0931   HGB 13.1 (L) 09/27/2019 0931   HCT 39.3 09/27/2019 0931   PLT 196 09/27/2019 0931   MCV 91.2 09/27/2019 0931   MCH 30.4 09/27/2019 0931   MCHC 33.3 09/27/2019 0931   RDW 13.3 09/27/2019 0931   LYMPHSABS 1,720 09/27/2019 0931   MONOABS 0.3 06/22/2019 2128   EOSABS 133 09/27/2019 0931   BASOSABS 19 09/27/2019 0931    No results found for: POCLITH, LITHIUM   No results found for: PHENYTOIN, PHENOBARB, VALPROATE, CBMZ   .res Assessment: Plan:    Yanis was seen today for follow-up and depression.  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 We discussed his chronic depression and anxiety and irritability, anger.  He's markedly better but has residual symptoms.  Most of mood problem is situational.   Benefit from meds and therapy. No change in therapy indicated.  Needs to continue the meds bc of a high relapse risk.  Supportive therapy  re: dealing with stress of separation. Doesn't look like things will change.  Option potentiate with something but defer.  Not likely to help bc it appears mostly situational.  Disc caffeine and sleep.  Takes it up to 9 pm.  Rec avoid late caffeine.  We discussed the short-term risks associated with benzodiazepines including sedation and increased fall risk among others.  Discussed long-term side effect risk including dependence, potential withdrawal symptoms, and the potential eventual dose-related risk of dementia.  No evidence of abuse.  Disc sobriety maintenance.    No med changes indicated.  This appointment was 15 minutes  FU 6 mos.  Lynder Parents, MD, DFAPA   Please see After Visit Summary for patient specific instructions.  Future Appointments  Date Time Provider [place]  11/22/2019  8:00 AM Shanon Ace, LCSW CP-CP None  12/11/2019  3:45 PM [place] P[facility]PEC PEC  12/20/2019  8:00 AM Shanon Ace, LCSW CP-CP None  01/03/2020  9:00 AM [name], MD [facility] None  05/14/2020  9:00 AM Ward Givens, NP [facility] None  09/26/2020  9:00 AM Liane Comber, NP [facility] None    No orders of the defined types were placed in this encounter.     -------------------------------

## 2019-11-22 ENCOUNTER — Ambulatory Visit (INDEPENDENT_AMBULATORY_CARE_PROVIDER_SITE_OTHER): Payer: Medicare Other | Admitting: Psychiatry

## 2019-11-22 DIAGNOSIS — F331 Major depressive disorder, recurrent, moderate: Secondary | ICD-10-CM

## 2019-11-22 NOTE — Progress Notes (Signed)
[facility] Counselor/Therapist Progress Note  Patient ID: Cody Kellum., MRN: [id number],    Date: 11/22/2019   Time Spent: 60 minutes   8:00am to 9:00am   Treatment Type: Individual Therapy  Reported Symptoms: anxiety, depression, frustrations   Mental Status Exam:  Appearance:   Casual     Behavior:  Appropriate and Sharing  Motor:  Normal  Speech/Language:   Normal Rate  Affect:  anxious, depressed  Mood:  anxious and depressed  Thought process:  goal directed  Thought content:    WNL  Sensory/Perceptual disturbances:    WNL  Orientation:  oriented to person, place, time/date, situation, day of week, month of year and year  Attention:  Good  Concentration:  Good  Memory:  reports some forgetfulness  Fund of knowledge:   Good  Insight:    Good  Judgment:   Good  Impulse Control:  Good   Risk Assessment: Danger to Self:  No Self-injurious Behavior: No Danger to Others: No Duty to Warn:no Physical Aggression / Violence:No  Access to Firearms a concern: No  Gang Involvement:No   Subjective:  Patient in today reporting anxiety and depression, "neither are worse", frustrations and having some issues at home with spouse.  Were separated 2 1/2 yrs before patient moved back in recently.     Interventions: Cognitive Behavioral Therapy and Solution-Oriented/Positive Psychology  Diagnosis:   ICD-10-CM   1. Major depressive disorder, recurrent episode, moderate ([place])  F33.1      Plan: Patient is not signing tx goals on computer screen due to [place].  Treatment Goals: Goals remain on tx plan as patient works on strategies to achieve his goals. Progress is documented each session in the "Progress" section of Plan.   Long term goal: Develop healthy cognitive patterns and beliefs about self and the world that lead to alleviation and help prevent relapse of depression.  Short term goal: Identify and replace depressive thinking that leads to depressive  feelings and actions.He will replace the depressive thinking with more positive, hopeful thoughts that can lead to more positive and hopeful feelings.  Strategy: Patient to more consistently monitor his thoughts and catch the depressive/negative thoughts, trying to change them to positiveand more reality-based thought patterns. Look at his negative thoughts and weigh them against the evidence.  PROGRESS: Patient today reports anxiety and depression "but not worse."  "Sometimes things seem much better and are going well and then anything can happen" and he feels wife mood changes and has an attitude towards patient."  Patient states at that point he "stays out of her way." and eventually she calms down, with or without being critical of patient per his report.  Previously he would not have distanced himself and would have ended up in argument with wife.  Patient seems to be demonstrating a little more patience in volatile situation which is progress for him.  Did work today more on his thought changing as when things are more volatile he has more difficulty with his anxious/negative/depressive thoughts which feed his anxiety and depression. Was able to recognize with some prompting, some of his anxiious/negative/depressive thoughts, and worked to change them to more positive, reality-based, hopeful, and empowering thoughts that do not support anxiety, negativity, nor depression.  Encouraged him to be practicing this more between sessions and admits he sometimes forgets "in the moment when things happen."  Encouraged healthy self-care (physical and mental), getting out of house, staying in touch with friends and family as  able with Covid, and setting healthy boundaries. Goal review and progress noted with patent.     Next appt within 3 weeks.   Shanon Ace, LCSW

## 2019-12-11 ENCOUNTER — Ambulatory Visit: Payer: Medicare Other | Attending: Internal Medicine

## 2019-12-11 DIAGNOSIS — Z23 Encounter for immunization: Secondary | ICD-10-CM | POA: Insufficient documentation

## 2019-12-11 NOTE — Progress Notes (Signed)
   [id number]  Name:  Cody Hines.    MRN: [id number] DOB: 16-Jun-1946  12/11/2019  Cody Hines was observed post Covid-19 immunization for 15 minutes without incident. He was provided with Vaccine Information Sheet and instruction to access the V-Safe system.   Cody Hines was instructed to call 911 with any severe reactions post vaccine: Marland Kitchen Difficulty breathing  . Swelling of face and throat  . A fast heartbeat  . A bad rash all over body  . Dizziness and weakness   Immunizations Administered    Name Date Dose VIS Date Route   Pfizer COVID-19 Vaccine 12/11/2019  3:38 PM 0.3 mL 09/16/2019 Intramuscular   Manufacturer: [place]   Lot: [id number]   [place]: [id number]

## 2019-12-13 DIAGNOSIS — H16213 Exposure keratoconjunctivitis, bilateral: Secondary | ICD-10-CM | POA: Diagnosis not present

## 2019-12-13 DIAGNOSIS — H2513 Age-related nuclear cataract, bilateral: Secondary | ICD-10-CM | POA: Diagnosis not present

## 2019-12-13 DIAGNOSIS — H17822 Peripheral opacity of cornea, left eye: Secondary | ICD-10-CM | POA: Diagnosis not present

## 2019-12-13 DIAGNOSIS — H04123 Dry eye syndrome of bilateral lacrimal glands: Secondary | ICD-10-CM | POA: Diagnosis not present

## 2019-12-13 DIAGNOSIS — H10413 Chronic giant papillary conjunctivitis, bilateral: Secondary | ICD-10-CM | POA: Diagnosis not present

## 2019-12-20 ENCOUNTER — Ambulatory Visit (INDEPENDENT_AMBULATORY_CARE_PROVIDER_SITE_OTHER): Payer: Medicare Other | Admitting: Psychiatry

## 2019-12-20 ENCOUNTER — Other Ambulatory Visit: Payer: Self-pay

## 2019-12-20 DIAGNOSIS — F411 Generalized anxiety disorder: Secondary | ICD-10-CM

## 2019-12-20 NOTE — Progress Notes (Signed)
[facility] Counselor/Therapist Progress Note  Patient ID: Cody Philmore Ladell Apolonio., MRN: [id number],    Date: 12/20/2019  Time Spent: 60 minutes   8:00am to 9:00am  Treatment Type: Individual Therapy  Reported Symptoms: anxiety, depression, "home" issues with wife   Mental Status Exam:  Appearance:   Casual     Behavior:  Appropriate and Sharing  Motor:  Normal  Speech/Language:   Normal Rate  Affect:  anxious, depressed  Mood:  anxious and depressed  Thought process:  normal  Thought content:    WNL  Sensory/Perceptual disturbances:    WNL  Orientation:  oriented to person, place, time/date, situation, day of week, month of year and year  Attention:  Good  Concentration:  Fair  Memory:  "some forgetfulness and stress makes it worse" and PCP is aware  Fund of knowledge:   Good  Insight:    Good  Judgment:   Good  Impulse Control:  Good   Risk Assessment: Danger to Self:  No Self-injurious Behavior: No Danger to Others: No Duty to Warn:no Physical Aggression / Violence:No  Access to Firearms a concern: No  Gang Involvement:No   Subjective:  Patient in today reporting more difficulty at home after having moved back in with wife.  He feels she is too picky at home, "doesn't want clutter, wants anything with personal info on it shredded, too critical".  Acknowledges, when asked, that some times are better.  Interventions: Cognitive Behavioral Therapy, Solution-Oriented/Positive Psychology and Ego-Supportive  Diagnosis:   ICD-10-CM   1. Generalized anxiety disorder  F41.1      Plan: Patient is not signing tx goals on computer screen due to [place].  Treatment Goals: Goals remain on tx plan as patient works on strategies to achieve his goals. Progress is documented each session in the "Progress" section of Plan.   Long term goal: Develop healthy cognitive patterns and beliefs about self and the world that lead to alleviation and help prevent relapse  of depression.  Short term goal: Identify and replace depressive thinking that leads to depressive feelings and actions.He will replace the depressive thinking with more positive, hopeful thoughts that can lead to more positive and hopeful feelings.  Strategy: Patient to more consistently monitor his thoughts and catch the depressive/negative thoughts, trying to change them to positiveand more reality-based thought patterns. Look at his negative thoughts and weigh them against the evidence.  PROGRESS: Patient today reports things at home have been more up and down past couple weeks, with "wife being ore critical" per patient and he feels she's being too picky about the upkeep of the home. Reports anxiety, frustration, and depression but not really worse. He shares that he has tried to "practice letting go of some negative things said to him by wife" versus responding negatively--sometimes he's successful with that and sometime he is not. Quite critical of wife and daughter-in-law. His health issues are still an issue with his back, and he has fallen again this past week on some steps but no major injuries.  States he sees his PCP next week and will make him aware of another fall.  Is frustrated with Covid as he feels "it's all overkill and has been that way."  Reflected with him on some of his prior "negative thought changing" and he has found if very difficult to maintain changes. Quite negative today and is able to be re-directed at times to include some positives.  Did encourage patient to focus on positives  more on his own, continue to stay in touch with his friends/family, good self-care (physically and emotionally), and work more on "letting go" and not holding on to negatives.    Goal review and progress/challenges noted with patient.   Next appt within 3 weeks.   Shanon Ace, LCSW

## 2019-12-29 DIAGNOSIS — H2511 Age-related nuclear cataract, right eye: Secondary | ICD-10-CM | POA: Diagnosis not present

## 2020-01-02 ENCOUNTER — Encounter: Payer: Self-pay | Admitting: Internal Medicine

## 2020-01-02 ENCOUNTER — Other Ambulatory Visit: Payer: Self-pay | Admitting: Psychiatry

## 2020-01-02 NOTE — Progress Notes (Signed)
Comprehensive Evaluation & Examination     This very nice 73 y.o. MWM presents for a  comprehensive evaluation and management of multiple medical co-morbidities.  Patient has been followed for HTN, HLD, OSA/CPAP, Prediabetes and Vitamin D Deficiency. Patient reports improved sleep hygiene with CPAP.  Patient is followed for Depression at [facility] by Shanon Ace, LCSW.     HTN predates since 74. Patient's BP has been controlled at home.  Today's BP is at goal -  118/60.  Patient does have CKD3a (GFR 50).  Patient denies any cardiac symptoms as chest pain, palpitations, shortness of breath, dizziness or ankle swelling.     Patient's hyperlipidemia is controlled with diet and Rosuvastatin. Patient denies myalgias or other medication SE's. Last lipids were at goal:  Lab Results  Component Value Date   CHOL 92 07/19/2019   HDL 43 07/19/2019   LDLCALC 32 07/19/2019   TRIG 86 07/19/2019   CHOLHDL 2.1 07/19/2019       Patient has hx/o prediabetes (A1c 6.0% / 2011) and patient denies reactive hypoglycemic symptoms, visual blurring, diabetic polys or paresthesias. Last A1c was near goal:  Lab Results  Component Value Date   HGBA1C 5.7 (H) 07/19/2019        Finally, patient has history of Vitamin D Deficiency ("29" /2008)  and last vitamin D was at goal:  Lab Results  Component Value Date   VD25OH 67 07/19/2019    Current Outpatient Medications on File Prior to Visit  Medication Sig  . acetaminophen (TYLENOL) 650 MG CR tablet Take 650 mg by mouth every 8 (eight) hours as needed for pain (or back pain or migraines).   Marland Kitchen aspirin EC 81 MG tablet Take 81 mg by mouth daily.  . Cholecalciferol (VITAMIN D3) 250 MCG (10000 UT) capsule Take 10,000 Units by mouth daily.  . enalapril (VASOTEC) 20 MG tablet Take 1 tablet Daily for BP (Patient taking differently: Take 20 mg by mouth daily. )  . Flaxseed, Linseed, (FLAX SEED OIL) 1000 MG CAPS Take 2,000 mg by mouth daily.   Marland Kitchen LORazepam  (ATIVAN) 1 MG tablet Take 1 tablet (1 mg total) by mouth at bedtime.  . Magnesium 500 MG TABS Take 500 mg by mouth daily.   . rosuvastatin (CRESTOR) 40 MG tablet Take 1 tablet Daily for Cholesterol  . sertraline (ZOLOFT) 100 MG tablet TAKE 2 TABLETS BY MOUTH DAILY   No current facility-administered medications on file prior to visit.   Allergies  Allergen Reactions  . Asa [Aspirin] Diarrhea, Nausea Only and Other (See Comments)    History of ulcers, also  . Atorvastatin Other (See Comments)    Muscle aches  . Celebrex [Celecoxib] Other (See Comments)    Headaches   . Codeine Nausea And Vomiting  . Savella [Milnacipran Hcl] Other (See Comments)    Reaction not recalled  . Viagra [Sildenafil Citrate] Other (See Comments)    Headaches  . Penicillins Rash and Other (See Comments)   Past Medical History:  Diagnosis Date  . BPH (benign prostatic hyperplasia)   . Cancer (HCC)    nose, skin cancer  . Elevated hemoglobin A1c   . Fibromyalgia   . GERD (gastroesophageal reflux disease)   . Hyperlipidemia   . Hypertension   . Hypogonadism male   . IBS (irritable bowel syndrome)   . OSA (obstructive sleep apnea)    Health Maintenance  Topic Date Due  . PNA vac Low Risk Adult (2 of 2 -  PPSV23) 09/18/2019  . INFLUENZA VACCINE  01/04/2020 (Originally 05/07/2019)  . TETANUS/TDAP  01/14/2020  . COLONOSCOPY  04/23/2023  . Hepatitis C Screening  Completed   Immunization History  Administered Date(s) Administered  . PFIZER SARS-COV-2 Vaccination 11/19/2019, 12/11/2019  . Pneumococcal Conjugate-13 09/17/2018  . Pneumococcal-Unspecified 07/16/2011  . Tdap 01/13/2010  . Zoster 08/09/2013   Last Colon - 04/22/2013 - Dorinda Hill recommend 5 yr f/u done 11/20/2017  Past Surgical History:  Procedure Laterality Date  .  nasal smr np3  1985  . KNEE ARTHROSCOPY Left 1999  . SKIN CANCER EXCISION  2020   nose  . SPINE SURGERY  2007   L5 S 1 Disk  . SPINE SURGERY  09/2019   Compression  fracture stabilization by Dr. Arnoldo Morale  . VASECTOMY  1983   Family History  Problem Relation Age of Onset  . Cancer Mother        breast  . Cancer Father        lung  . Diabetes Father   . Heart disease Sister   . Arthritis Sister    Social History   Socioeconomic History  . Marital status: Divorced  . Number of children: 1 daughter  Occupational History  . 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 Smoking status: Former Smoker    Years: 30.00    Types: Cigarettes    Quit date: 10/06/1980    Years since quitting: 39.2  . Smokeless tobacco: Never Used  Substance and Sexual Activity  . Alcohol use: No    Comment: Not drinking x 1 month  . Drug use: No  . Sexual activity: Not Currently     ROS Constitutional: Denies fever, chills, weight loss/gain, headaches, insomnia,  night sweats or change in appetite. Does c/o fatigue. Eyes: Denies redness, blurred vision, diplopia, discharge, itchy or watery eyes.  ENT: Denies discharge, congestion, post nasal drip, epistaxis, sore throat, earache, hearing loss, dental pain, Tinnitus, Vertigo, Sinus pain or snoring.  Cardio: Denies chest pain, palpitations, irregular heartbeat, syncope, dyspnea, diaphoresis, orthopnea, PND, claudication or edema Respiratory: denies cough, dyspnea, DOE, pleurisy, hoarseness, laryngitis or wheezing.  Gastrointestinal: Denies dysphagia, heartburn, reflux, water brash, pain, cramps, nausea, vomiting, bloating, diarrhea, constipation, hematemesis, melena, hematochezia, jaundice or hemorrhoids Genitourinary: Denies dysuria, frequency, urgency, nocturia, hesitancy, discharge, hematuria or flank pain Musculoskeletal: Denies arthralgia, myalgia, stiffness, Jt. Swelling, pain, limp or strain/sprain. Denies Falls. Skin: Denies puritis, rash, hives, warts, acne, eczema or change in skin lesion Neuro: No weakness, tremor, incoordination, spasms, paresthesia or pain Psychiatric: Denies confusion, memory loss or sensory  loss. Denies Depression. Endocrine: Denies change in weight, skin, hair change, nocturia, and paresthesia, diabetic polys, visual blurring or hyper / hypo glycemic episodes.  Heme/Lymph: No excessive bleeding, bruising or enlarged lymph nodes.  Physical Exam  BP 118/60   Pulse 68   Temp (!) 97 F (36.1 C)   Resp 16   Ht 6' (1.829 m)   Wt 202 lb (91.6 kg)   BMI 27.40 kg/m   General Appearance: Well nourished and well groomed and in no apparent distress.  Eyes: PERRLA, EOMs, conjunctiva no swelling or erythema, normal fundi and vessels. Sinuses: No frontal/maxillary tenderness ENT/Mouth: EACs patent / TMs  nl. Nares clear without erythema, swelling, mucoid exudates. Oral hygiene is good. No erythema, swelling, or exudate. Tongue normal, non-obstructing. Tonsils not swollen or erythematous. Hearing normal.  Neck: Supple, thyroid not palpable. No bruits, nodes or JVD. Respiratory: Respiratory effort normal.  BS equal and  clear bilateral without rales, rhonci, wheezing or stridor. Cardio: Heart sounds are normal with regular rate and rhythm and no murmurs, rubs or gallops. Peripheral pulses are normal and equal bilaterally without edema. No aortic or femoral bruits. Chest: symmetric with normal excursions and percussion.  Abdomen: Soft, with Nl bowel sounds. Nontender, no guarding, rebound, hernias, masses, or organomegaly.  Lymphatics: Non tender without lymphadenopathy.  Musculoskeletal: Full ROM all peripheral extremities, joint stability, 5/5 strength, and normal gait. Skin: Warm and dry without rashes, lesions, cyanosis, clubbing or  ecchymosis.  Neuro: Cranial nerves intact, reflexes equal bilaterally. Normal muscle tone, no cerebellar symptoms. Sensation intact.  Pysch: Alert and oriented X 3 with normal affect, insight and judgment appropriate.   Assessment and Plan  1. Essential hypertension  - EKG 12-Lead - [place], RETROPERITNL ABD,  LTD - Urinalysis, Routine w reflex  microscopic - Microalbumin / creatinine urine ratio - CBC with Differential/Platelet - COMPLETE METABOLIC PANEL WITH GFR - Magnesium - TSH  2. Hyperlipidemia, mixed  - EKG 12-Lead - [place], RETROPERITNL ABD,  LTD - Lipid panel - TSH  3. Abnormal glucose  - EKG 12-Lead - [place], RETROPERITNL ABD,  LTD - Hemoglobin A1c - Insulin, random  4. Vitamin D deficiency  - VITAMIN D 25 Hydroxy  5. Prediabetes  - EKG 12-Lead - [place], RETROPERITNL ABD,  LTD - Hemoglobin A1c - Insulin, random  6. OSA on CPAP   7. Major depressive disorder, recurrent episode, moderate (HCC)   8. BPH with obstruction/lower urinary tract symptoms  - Urinalysis, Routine w reflex microscopic - Microalbumin / creatinine urine ratio - PSA  9. Stage 3a chronic kidney disease  - Urinalysis, Routine w reflex microscopic - Microalbumin / creatinine urine ratio - COMPLETE METABOLIC PANEL WITH GFR  10. Screening for colorectal cancer  - POC Hemoccult Bld/Stl   11. Prostate cancer screening  - PSA  12. Screening for ischemic heart disease  - EKG 12-Lead  13. FHx: heart disease  - EKG 12-Lead - [place], RETROPERITNL ABD,  LTD  14. Former smoker  - EKG 12-Lead - [place], RETROPERITNL ABD,  LTD  15. Screening for AAA (aortic abdominal aneurysm)  - [place], RETROPERITNL ABD,  LTD  16. Medication management  - Urinalysis, Routine w reflex microscopic - Microalbumin / creatinine urine ratio - CBC with Differential/Platelet - COMPLETE METABOLIC PANEL WITH GFR - Magnesium - Lipid panel - TSH - Hemoglobin A1c - Insulin, random - VITAMIN D 25 Hydroxy        Patient was counseled in prudent diet, weight control to achieve/maintain BMI less than 25, BP monitoring, regular exercise and medications as discussed.  Discussed med effects and SE's. Routine screening labs and tests as requested with regular follow-up as recommended. Over 40 minutes of exam, counseling, chart review and high complex critical decision  making was performed   Kirtland Bouchard, MD

## 2020-01-02 NOTE — Patient Instructions (Signed)

## 2020-01-03 ENCOUNTER — Encounter: Payer: Self-pay | Admitting: Internal Medicine

## 2020-01-03 ENCOUNTER — Other Ambulatory Visit: Payer: Self-pay

## 2020-01-03 ENCOUNTER — Ambulatory Visit (INDEPENDENT_AMBULATORY_CARE_PROVIDER_SITE_OTHER): Payer: Medicare Other | Admitting: Internal Medicine

## 2020-01-03 VITALS — BP 118/60 | HR 68 | Temp 97.0°F | Resp 16 | Ht 72.0 in | Wt 202.0 lb

## 2020-01-03 DIAGNOSIS — G4733 Obstructive sleep apnea (adult) (pediatric): Secondary | ICD-10-CM

## 2020-01-03 DIAGNOSIS — I10 Essential (primary) hypertension: Secondary | ICD-10-CM | POA: Diagnosis not present

## 2020-01-03 DIAGNOSIS — N401 Enlarged prostate with lower urinary tract symptoms: Secondary | ICD-10-CM

## 2020-01-03 DIAGNOSIS — Z125 Encounter for screening for malignant neoplasm of prostate: Secondary | ICD-10-CM

## 2020-01-03 DIAGNOSIS — Z8249 Family history of ischemic heart disease and other diseases of the circulatory system: Secondary | ICD-10-CM

## 2020-01-03 DIAGNOSIS — Z87891 Personal history of nicotine dependence: Secondary | ICD-10-CM

## 2020-01-03 DIAGNOSIS — Z1211 Encounter for screening for malignant neoplasm of colon: Secondary | ICD-10-CM

## 2020-01-03 DIAGNOSIS — N1831 Chronic kidney disease, stage 3a: Secondary | ICD-10-CM

## 2020-01-03 DIAGNOSIS — Z9989 Dependence on other enabling machines and devices: Secondary | ICD-10-CM

## 2020-01-03 DIAGNOSIS — Z136 Encounter for screening for cardiovascular disorders: Secondary | ICD-10-CM

## 2020-01-03 DIAGNOSIS — E559 Vitamin D deficiency, unspecified: Secondary | ICD-10-CM

## 2020-01-03 DIAGNOSIS — R7303 Prediabetes: Secondary | ICD-10-CM

## 2020-01-03 DIAGNOSIS — E782 Mixed hyperlipidemia: Secondary | ICD-10-CM

## 2020-01-03 DIAGNOSIS — F331 Major depressive disorder, recurrent, moderate: Secondary | ICD-10-CM

## 2020-01-03 DIAGNOSIS — R7309 Other abnormal glucose: Secondary | ICD-10-CM | POA: Diagnosis not present

## 2020-01-03 DIAGNOSIS — N138 Other obstructive and reflux uropathy: Secondary | ICD-10-CM | POA: Diagnosis not present

## 2020-01-03 DIAGNOSIS — Z79899 Other long term (current) drug therapy: Secondary | ICD-10-CM

## 2020-01-04 LAB — CBC WITH DIFFERENTIAL/PLATELET
Absolute Monocytes: 386 cells/uL (ref 200–950)
Basophils Absolute: 9 cells/uL (ref 0–200)
Basophils Relative: 0.2 %
Eosinophils Absolute: 193 cells/uL (ref 15–500)
Eosinophils Relative: 4.2 %
HCT: 39.9 % (ref 38.5–50.0)
Hemoglobin: 13.1 g/dL — ABNORMAL LOW (ref 13.2–17.1)
Lymphs Abs: 1670 cells/uL (ref 850–3900)
MCH: 30.1 pg (ref 27.0–33.0)
MCHC: 32.8 g/dL (ref 32.0–36.0)
MCV: 91.7 fL (ref 80.0–100.0)
MPV: 10.7 fL (ref 7.5–12.5)
Monocytes Relative: 8.4 %
Neutro Abs: 2341 cells/uL (ref 1500–7800)
Neutrophils Relative %: 50.9 %
Platelets: 132 10*3/uL — ABNORMAL LOW (ref 140–400)
RBC: 4.35 10*6/uL (ref 4.20–5.80)
RDW: 13.6 % (ref 11.0–15.0)
Total Lymphocyte: 36.3 %
WBC: 4.6 10*3/uL (ref 3.8–10.8)

## 2020-01-04 LAB — URINALYSIS, ROUTINE W REFLEX MICROSCOPIC
Bilirubin Urine: NEGATIVE
Glucose, UA: NEGATIVE
Hgb urine dipstick: NEGATIVE
Ketones, ur: NEGATIVE
Leukocytes,Ua: NEGATIVE
Nitrite: NEGATIVE
Protein, ur: NEGATIVE
Specific Gravity, Urine: 1.014 (ref 1.001–1.03)
pH: 5.5 (ref 5.0–8.0)

## 2020-01-04 LAB — COMPLETE METABOLIC PANEL WITH GFR
AG Ratio: 1.6 (calc) (ref 1.0–2.5)
ALT: 29 U/L (ref 9–46)
AST: 44 U/L — ABNORMAL HIGH (ref 10–35)
Albumin: 4.1 g/dL (ref 3.6–5.1)
Alkaline phosphatase (APISO): 97 U/L (ref 35–144)
BUN/Creatinine Ratio: 11 (calc) (ref 6–22)
BUN: 15 mg/dL (ref 7–25)
CO2: 31 mmol/L (ref 20–32)
Calcium: 9.9 mg/dL (ref 8.6–10.3)
Chloride: 103 mmol/L (ref 98–110)
Creat: 1.31 mg/dL — ABNORMAL HIGH (ref 0.70–1.18)
GFR, Est African American: 63 mL/min/{1.73_m2} (ref >=60)
GFR, Est Non African American: 54 mL/min/{1.73_m2} — ABNORMAL LOW (ref >=60)
Globulin: 2.5 g/dL (calc) (ref 1.9–3.7)
Glucose, Bld: 179 mg/dL — ABNORMAL HIGH (ref 65–99)
Potassium: 4.3 mmol/L (ref 3.5–5.3)
Sodium: 140 mmol/L (ref 135–146)
Total Bilirubin: 0.8 mg/dL (ref 0.2–1.2)
Total Protein: 6.6 g/dL (ref 6.1–8.1)

## 2020-01-04 LAB — MAGNESIUM: Magnesium: 2 mg/dL (ref 1.5–2.5)

## 2020-01-04 LAB — VITAMIN D 25 HYDROXY (VIT D DEFICIENCY, FRACTURES): Vit D, 25-Hydroxy: 52 ng/mL (ref 30–100)

## 2020-01-04 LAB — HEMOGLOBIN A1C
Hgb A1c MFr Bld: 5.6 % of total Hgb (ref <5.7)
Mean Plasma Glucose: 114 (calc)
eAG (mmol/L): 6.3 (calc)

## 2020-01-04 LAB — LIPID PANEL
Cholesterol: 132 mg/dL (ref <200)
HDL: 54 mg/dL (ref >=40)
LDL Cholesterol (Calc): 62 mg/dL (calc)
Non-HDL Cholesterol (Calc): 78 mg/dL (calc) (ref <130)
Total CHOL/HDL Ratio: 2.4 (calc) (ref <5.0)
Triglycerides: 80 mg/dL (ref <150)

## 2020-01-04 LAB — PSA: PSA: 0.6 ng/mL (ref <=4.0)

## 2020-01-04 LAB — TSH: TSH: 2.68 mIU/L (ref 0.40–4.50)

## 2020-01-04 LAB — MICROALBUMIN / CREATININE URINE RATIO
Creatinine, Urine: 104 mg/dL (ref 20–320)
Microalb Creat Ratio: 7 mcg/mg creat (ref <30)
Microalb, Ur: 0.7 mg/dL

## 2020-01-04 LAB — INSULIN, RANDOM: Insulin: 91 u[IU]/mL — ABNORMAL HIGH

## 2020-01-13 IMAGING — CT CT ABD-PELV W/ CM
2 of 5 series · 14 of 46 positions shown, 16 images · IV contrast (APPLIED)
Comparison: None.

CLINICAL DATA: Fall from ladder 2 days ago known T12 fracture

EXAM:
CT CHEST, ABDOMEN, AND PELVIS WITH CONTRAST
TECHNIQUE: Multidetector CT imaging of the chest, abdomen and pelvis was
performed following the standard protocol during bolus
administration of intravenous contrast.
CONTRAST:  100mL OMNIPAQUE IOHEXOL 300 MG/ML  SOLN

[Series 3: cap 5.0 i31f 2 · axial · 0.91mm/px · z∈[+490,+1065]mm · 11 of 139 slices shown, 13 images]
[im 12/139  soft-tissue]
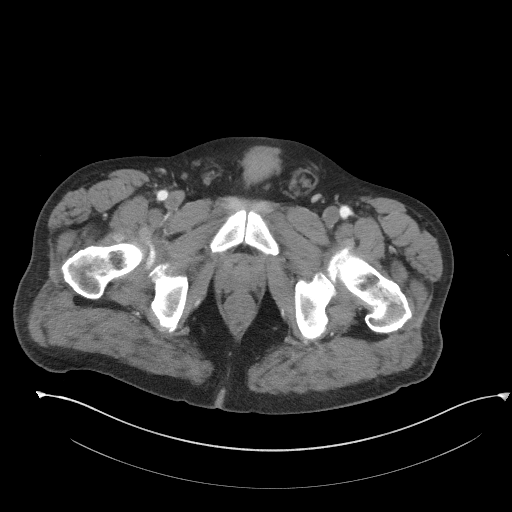
[im 12/139  bone]
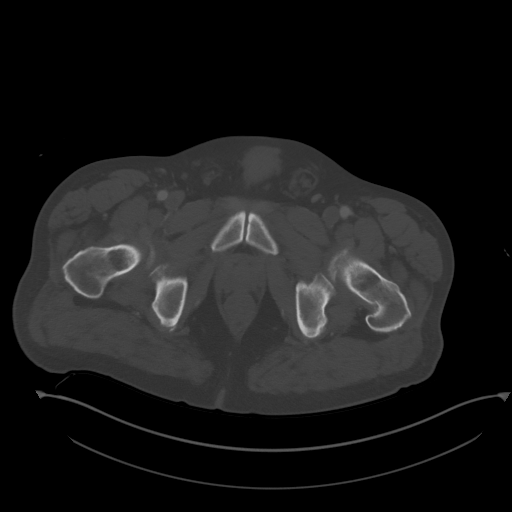
[im 24/139  soft-tissue]
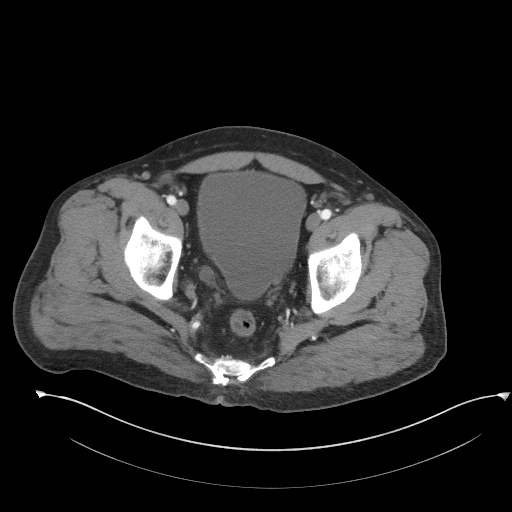
[im 35/139  soft-tissue]
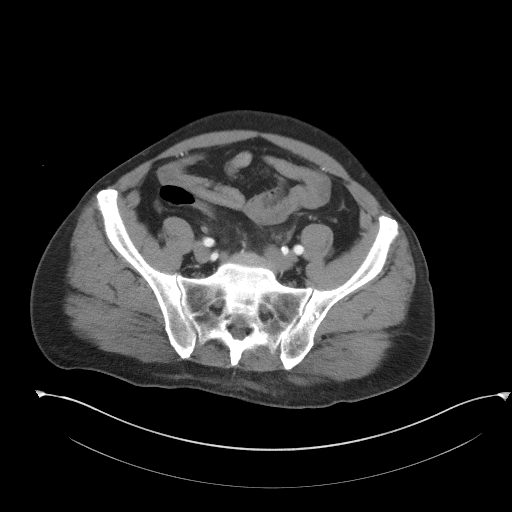
[im 47/139  soft-tissue]
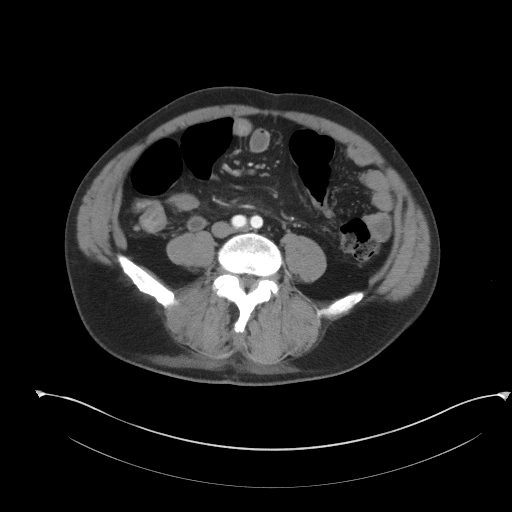
[im 58/139  soft-tissue]
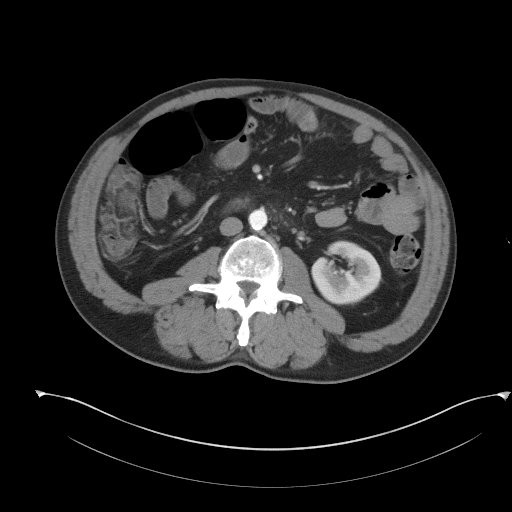
[im 70/139  soft-tissue]
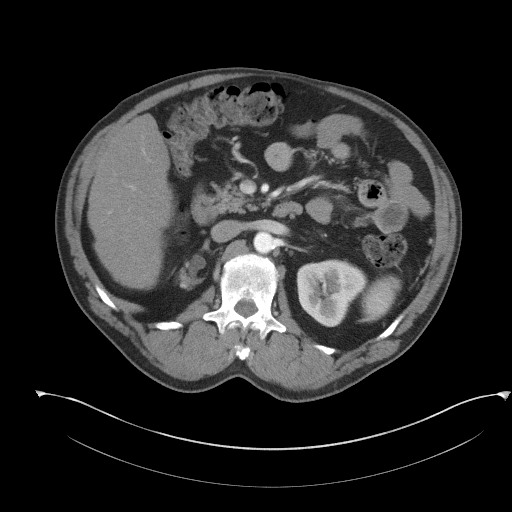
[im 81/139  soft-tissue]
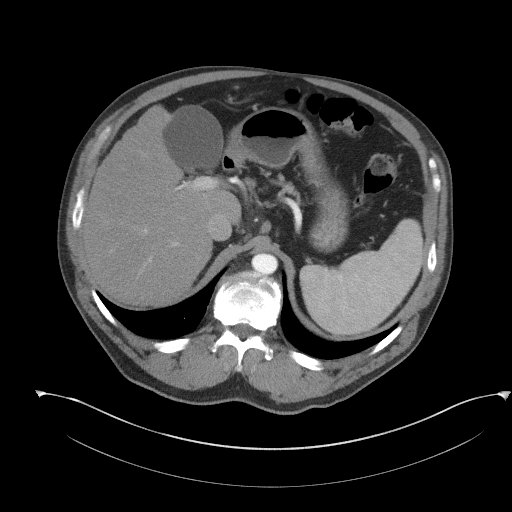
[im 93/139  soft-tissue]
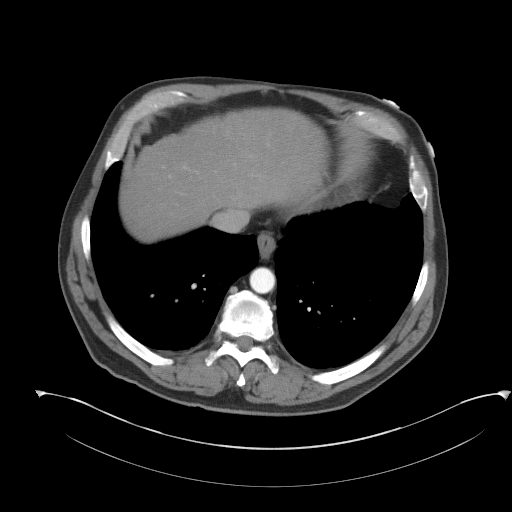
[im 104/139  soft-tissue]
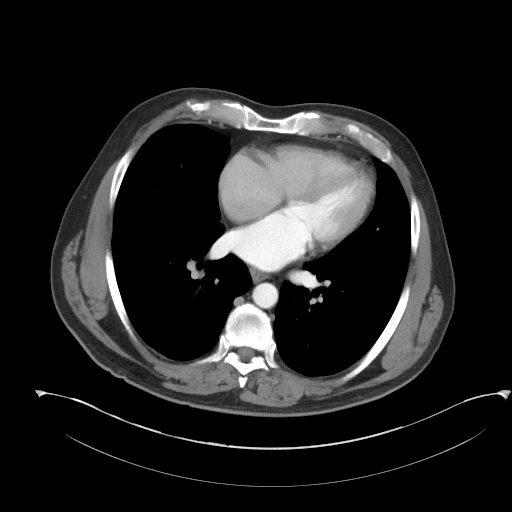
[im 104/139  bone]
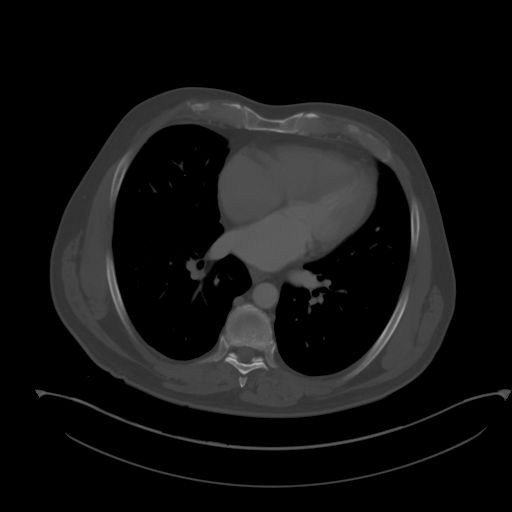
[im 116/139  soft-tissue]
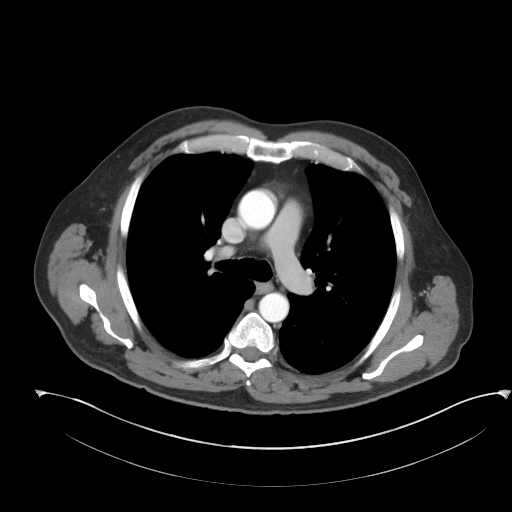
[im 127/139  soft-tissue]
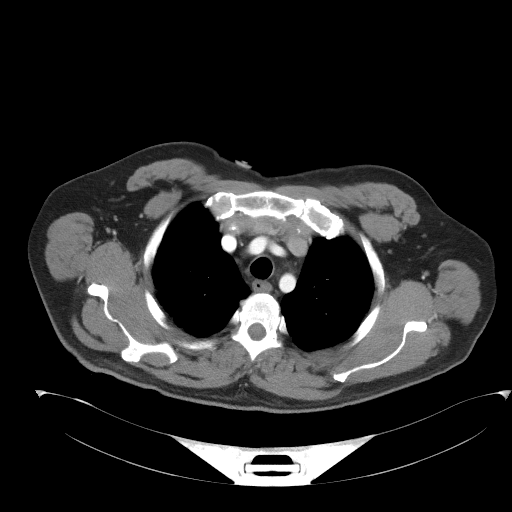

[Series 6: coronal · coronal · 0.99mm/px · 3 of 169 slices shown]
[im 57/169  soft-tissue]
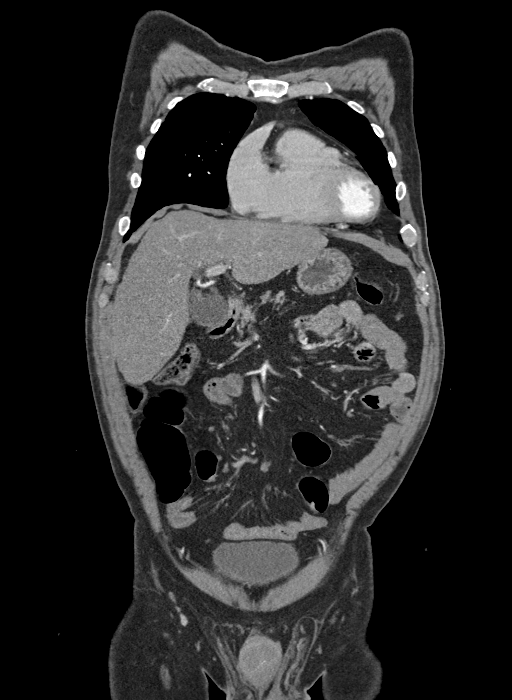
[im 75/169  soft-tissue]
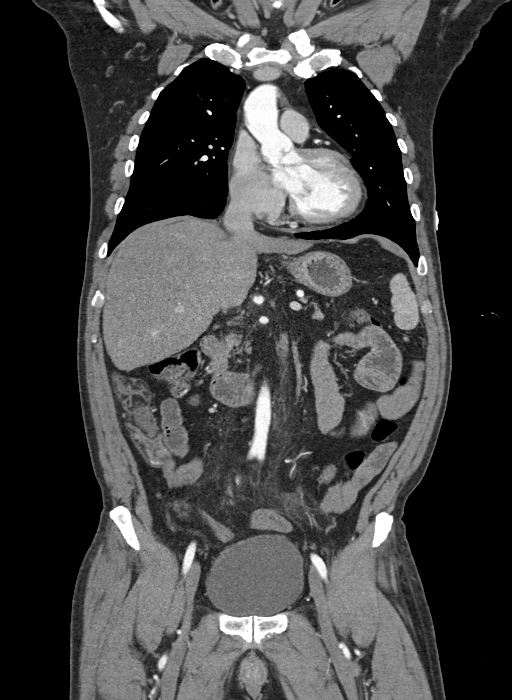
[im 94/169  soft-tissue]
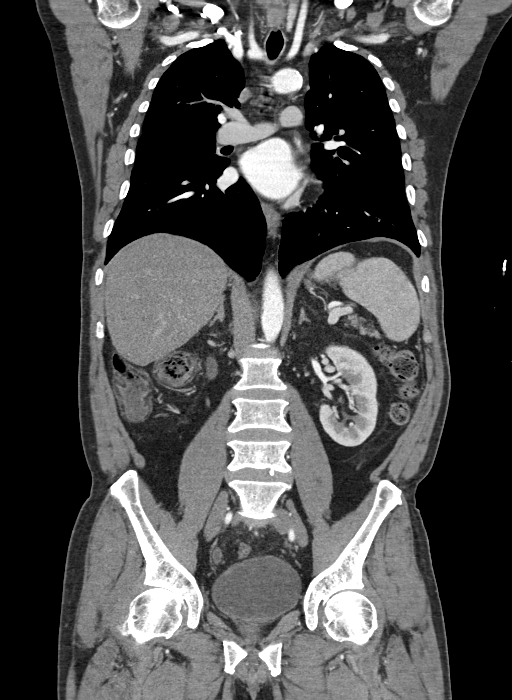

[14 of 46 positions shown; findings below may reference images not displayed]

FINDINGS: CT CHEST FINDINGS

Cardiovascular: Mild atherosclerotic changes of the thoracic aorta
are noted. No aortic dissection or aneurysmal dilatation is seen. No
aortic hematoma is noted. Heart is at the upper limits of normal in
size. No significant coronary calcifications are seen. The pulmonary
artery as visualized is within normal limits although not timed for
PE evaluation.

Mediastinum/Nodes: Thoracic inlet is within normal limits. No hilar
or mediastinal adenopathy is noted. The esophagus is within normal
limits.

Lungs/Pleura: Lungs are well aerated bilaterally. No focal
infiltrate or sizable effusion is seen. No pneumothorax is noted.

Musculoskeletal: T12 compression deformity is again identified. No
other focal bony abnormality in the thorax is noted.

CT ABDOMEN PELVIS FINDINGS

Hepatobiliary: Gallbladder is well distended. The liver shows no
focal mass lesion.

Pancreas: Unremarkable. No pancreatic ductal dilatation or
surrounding inflammatory changes.

Spleen: Normal in size without focal abnormality.

Adrenals/Urinary Tract: The adrenal glands are within normal limits.
The right kidney is shrunken likely related to renal artery disease
as the right renal artery is not well appreciated. Left renal artery
is within normal limits. Left kidney demonstrates a normal
enhancement pattern. Delayed images demonstrate normal excretion of
contrast material. Duplicated collecting system on the left is
noted. The bladder is partially distended.

Stomach/Bowel: The appendix is within normal limits. Colon is well
visualized and within normal limits. No obstructive or inflammatory
small bowel changes are seen.

Vascular/Lymphatic: Aortic atherosclerosis. No enlarged abdominal or
pelvic lymph nodes.

Reproductive: Prostate is unremarkable.

Other: No free fluid is noted within the pelvis. Some mild stranding
is noted within the mesentery likely related to the recent trauma
although no sizable hematoma is seen. Small fat containing inguinal
hernia on the left is noted.

Musculoskeletal: No acute bony abnormality is noted. Degenerative
changes of the lumbar spine are seen.
IMPRESSION: T12 compression deformity similar to that seen on prior CT of the
thoracic spine.

Right renal atrophy likely related to occluded right renal artery.

Mild stranding within the mesenteric fat likely related to the
recent blunt trauma. No sizable hematoma is seen.

## 2020-01-13 IMAGING — CT CT T SPINE W/O CM
2 of 3 series · 11 of 33 positions shown, 13 images · IV contrast (agent unspecified)
Comparison: Lumbar spine CT earlier today.

CLINICAL DATA: 72-year-old male status post 8 ft fall from ladder
today.

EXAM:
CT THORACIC SPINE WITH CONTRAST
TECHNIQUE: Multiplanar CT images of the thoracic spine were reconstructed from
contemporary CT of the Chest.
CONTRAST:  No additional

[Series 1: t spine axial · axial · 0.27mm/px · z∈[+780,+1092]mm · 8 of 186 slices shown, 10 images]
[im 15/186  soft-tissue]
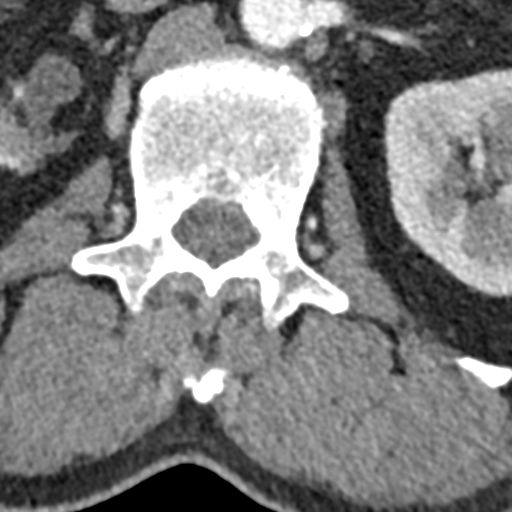
[im 15/186  bone]
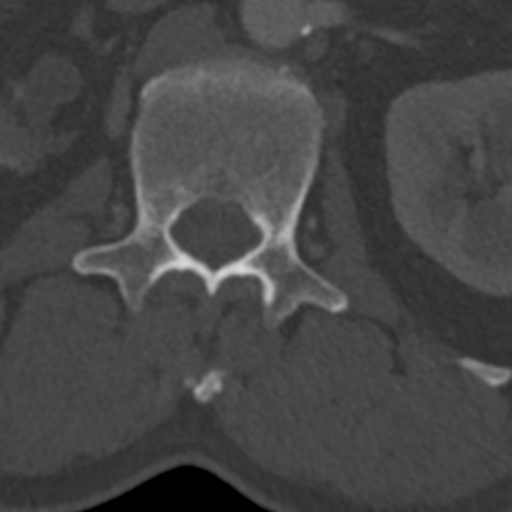
[im 43/186  bone]
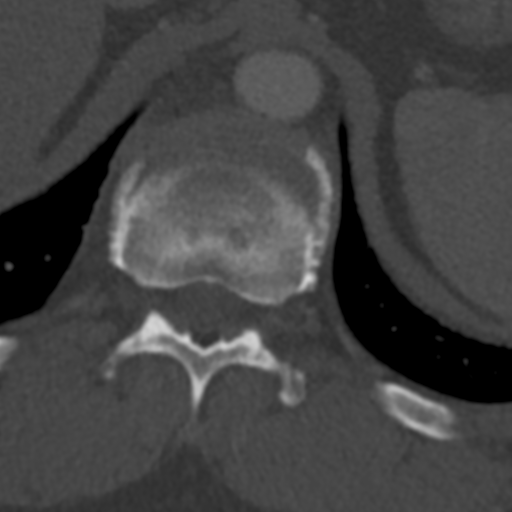
[im 57/186  bone]
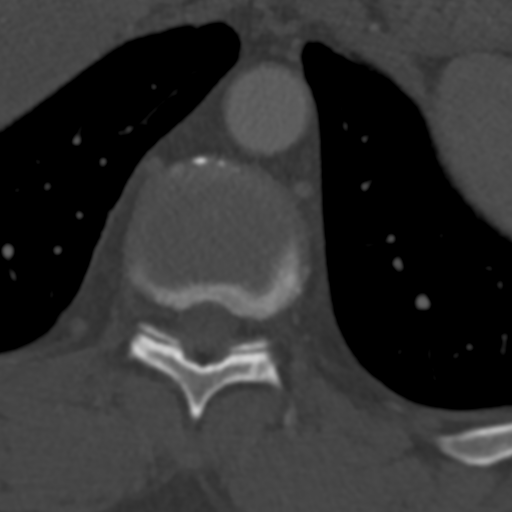
[im 86/186  bone]
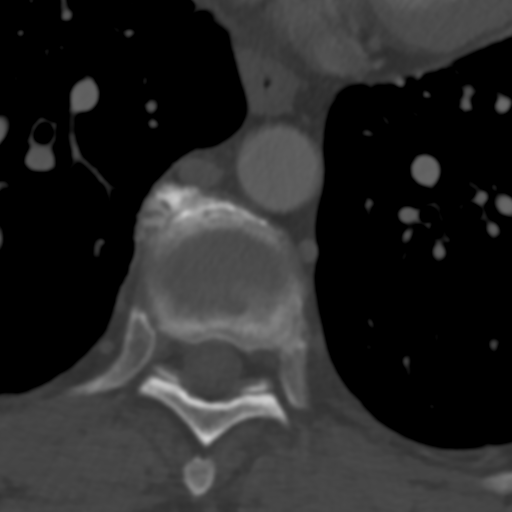
[im 100/186  soft-tissue]
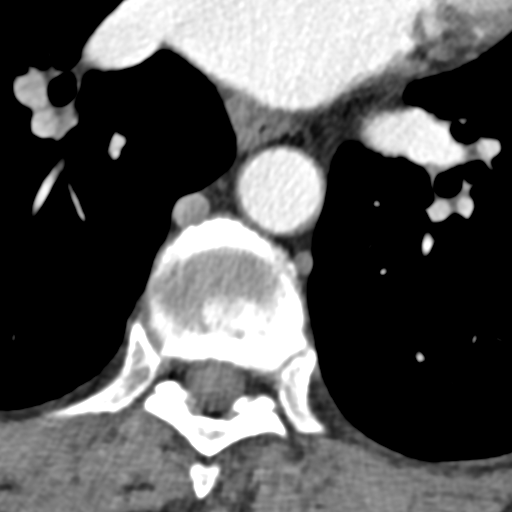
[im 100/186  bone]
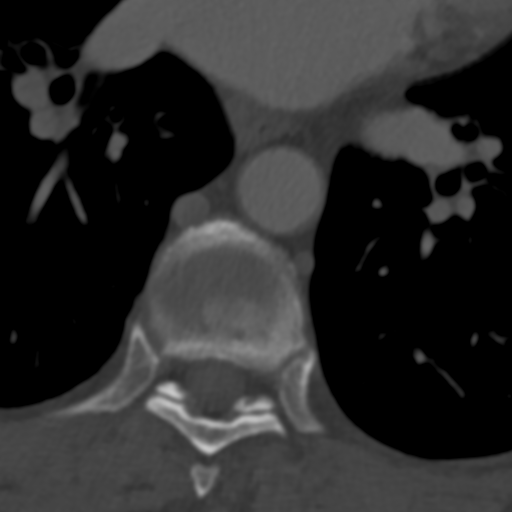
[im 129/186  bone]
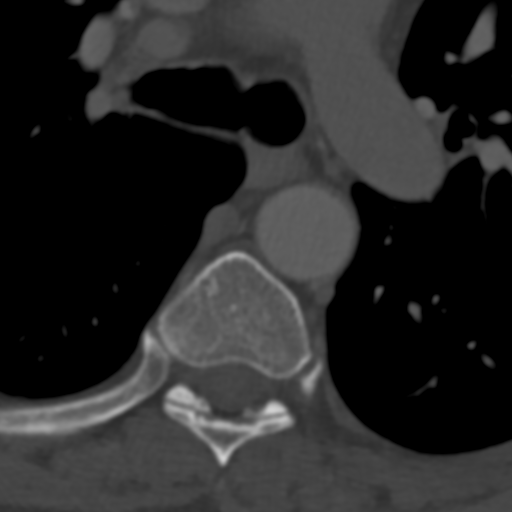
[im 143/186  bone]
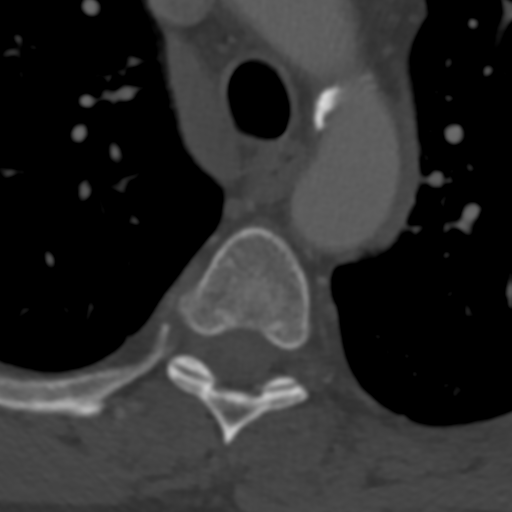
[im 171/186  bone]
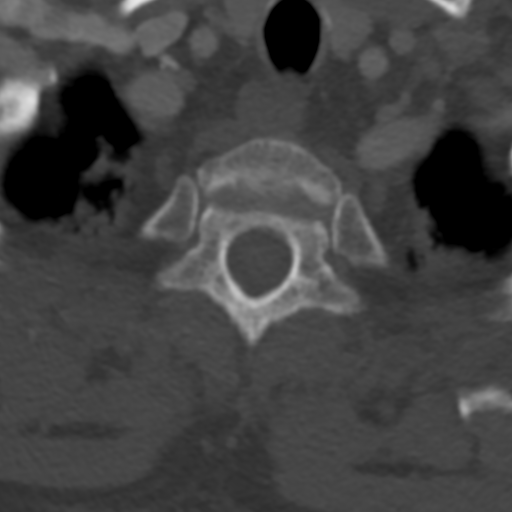

[Series 4: t spine cor · coronal · 0.27mm/px · 3 of 80 slices shown]
[im 16/80  bone]
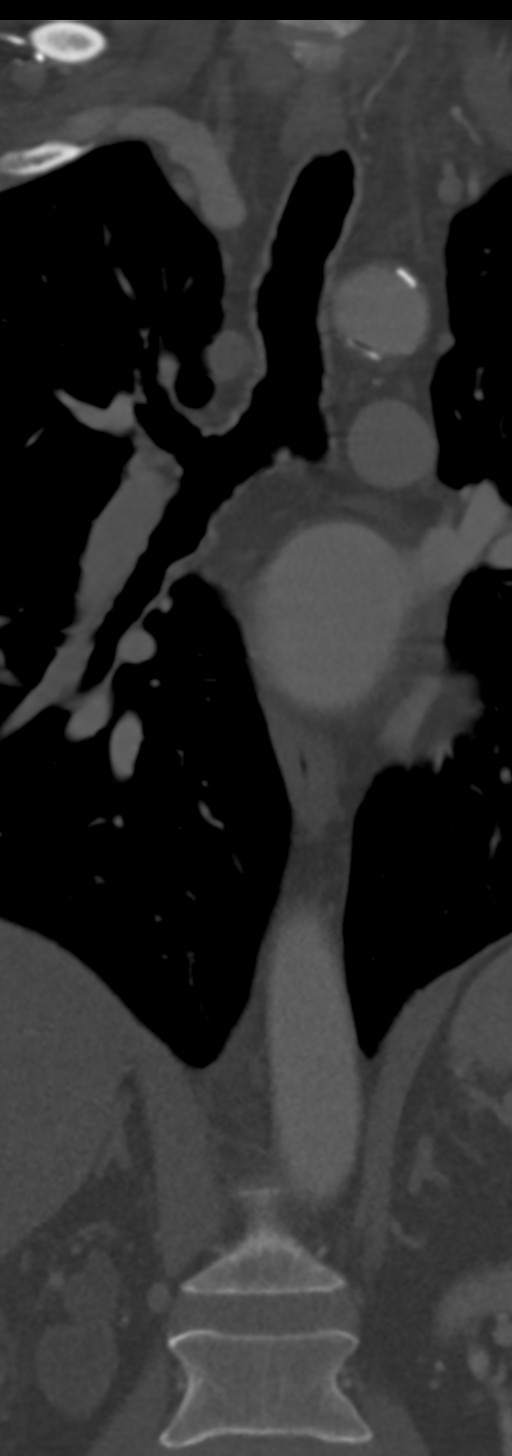
[im 32/80  bone]
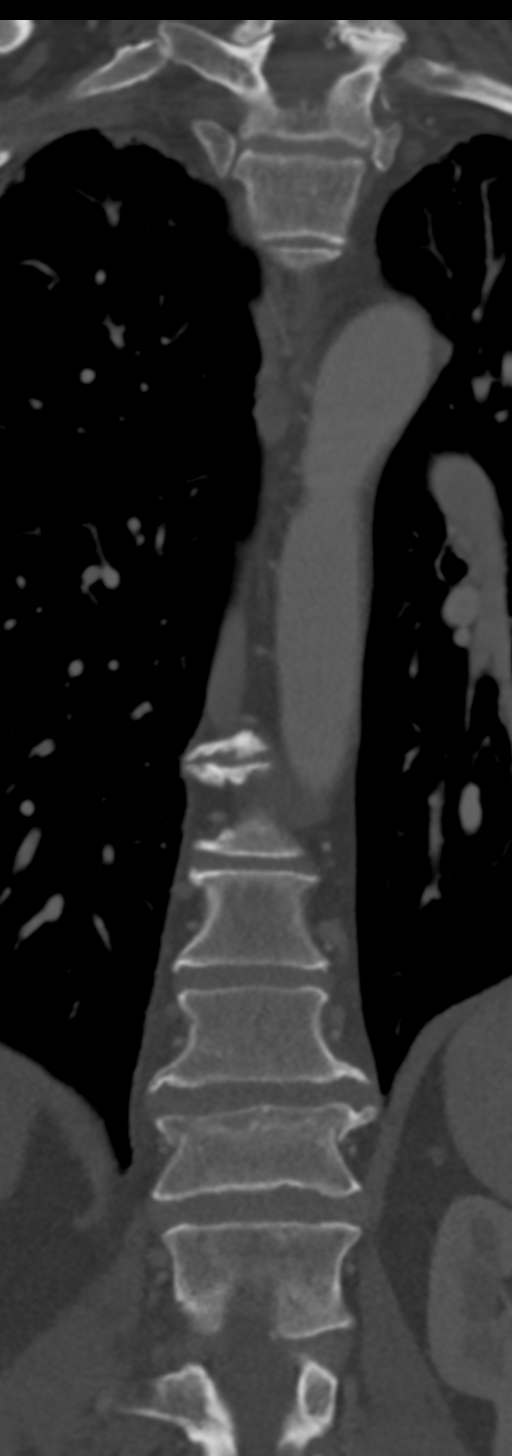
[im 48/80  bone]
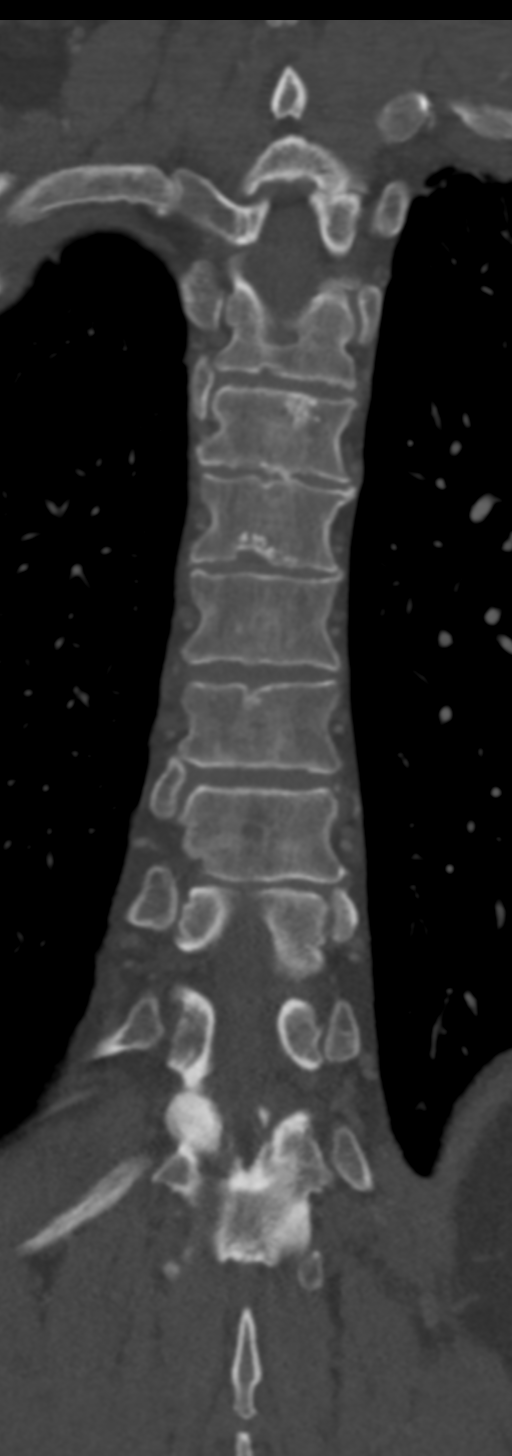

[11 of 33 positions shown; findings below may reference images not displayed]

CT Chest, Abdomen, and
Pelvis today are reported separately. Cervical spine CT today.
FINDINGS: Segmentation: Normal, concordant with the lumbar spine numbering
today.

Alignment: Preserved thoracic kyphosis. No spondylolisthesis.

Vertebrae: Comminuted T12 superior endplate fracture with anterior
wedging is stable in configuration from the earlier lumbar CT. The
T12 pedicles and posterior elements remain intact.

The adjacent T11 vertebra is intact. The other thoracic vertebrae
are intact.

Visible posterior ribs appear intact.

Paraspinal and other soft tissues: Chest and abdomen findings today
are reported separately.

There is mild paraspinal edema adjacent to the T12 superior endplate
fracture.

Disc levels: Intermittent thoracic spine disc and endplate
degeneration, but no CT evidence of significant thoracic spinal
stenosis above T11.

T11-T12: Circumferential disc bulge with mild posterior element
hypertrophy. Broad-based posterior component of disc. Mild spinal
stenosis. Mild T11 foraminal stenosis greater on the right.

T12-L1: No retropulsion of bone. Mild disc bulge. No significant
spinal stenosis. Mild left T12 foraminal stenosis.
IMPRESSION: 1. Comminuted T12 superior endplate fracture with anterior wedging
is stable from the earlier Lumbar CT (please see that report). No
retropulsion of bone or complicating features.
2. No other acute osseous abnormality in the thoracic spine.
3. Superimposed degenerative mild thoracic spinal stenosis at
T11-T12, in part due to disc bulging. Similar degenerative T11 and
left T12 foraminal stenosis.
4.  CT Chest, Abdomen, and Pelvis today are reported separately.

## 2020-01-19 ENCOUNTER — Ambulatory Visit (INDEPENDENT_AMBULATORY_CARE_PROVIDER_SITE_OTHER): Payer: Medicare Other | Admitting: Psychiatry

## 2020-01-19 ENCOUNTER — Other Ambulatory Visit: Payer: Self-pay

## 2020-01-19 DIAGNOSIS — F411 Generalized anxiety disorder: Secondary | ICD-10-CM

## 2020-01-19 NOTE — Progress Notes (Signed)
[facility] Counselor/Therapist Progress Note  Patient ID: Cody Philmore Delmar Both., MRN: [id number],    Date: 01/19/2020  Time Spent: 60 minutes   8:00am to 9:00am  Treatment Type: Individual Therapy  Reported Symptoms: anxiety, depression, frustration  Mental Status Exam:  Appearance:   Casual     Behavior:  Appropriate and Sharing  Motor:  Normal  Speech/Language:   Normal Rate  Affect:  anxious, depressed  Mood:  anxious and depressed  Thought process:  goal directed  Thought content:    some rumination  Sensory/Perceptual disturbances:    WNL  Orientation:  oriented to person, place, time/date, situation, day of week, month of year and year  Attention:  Good  Concentration:  Good  Memory:  some forgetfulness and PCP is aware  Fund of knowledge:   Good  Insight:    Good and Fair  Judgment:   Good  Impulse Control:  Good   Risk Assessment: Danger to Self:  No Self-injurious Behavior: No Danger to Others: No Duty to Warn:no Physical Aggression / Violence:No  Access to Firearms a concern: No  Gang Involvement:No   Subjective:  Patient in today frustrated, anxious, depressed.  Situation at home with wife has worsened some.   Interventions: Solution-Oriented/Positive Psychology and Ego-Supportive  Diagnosis:   ICD-10-CM   1. Generalized anxiety disorder  F41.1     Plan: Patient is not signing tx goals on computer screen due to [place].  Treatment Goals: Goals remain on tx plan as patient works on strategies to achieve his goals. Progress is documented each session in the "Progress" section of Plan.   Long term goal: Develop healthy cognitive patterns and beliefs about self and the world that lead to alleviation and help prevent relapse of depression.  Short term goal: Identify and replace depressive thinking that leads to depressive feelings and actions.He will replace the depressive thinking with more positive, hopeful thoughts that can lead  to more positive and hopeful feelings.  Strategy: Patient to more consistently monitor his thoughts and catch the depressive/negative thoughts, trying to change them to positiveand more reality-based thought patterns. Look at his negative thoughts and weigh them against the evidence.  PROGRESS: Patient in today with anxiety, depression, and frustration.  States "I just don't understand my wife, one minute she is reaming me out and the next minute she is baking me a cake."  Able to openly share his frustrations and difficult to see anything he might be doing to contribute to the occasional tension at home. He feels she gets upset over "nothing" States "I told her She is the one that needs to be going to a therapist."  From patient report, it sounds like he and wife do exchange negative critical comments to each other.  From his report, it has worsened some more recently.  Encouraged him to look at what he might can do differently to not contribute to the negative, hurtful comment. He shares that he does not start those episodes and does find it hard not rot to respond or to "let go". His back issues are not a lot better, even after recent injection. Encouraged better self-care including physical hygiene, eating healthy, getting outside daily and walking as he can, positive self-talk, practice removing himself when tension rises at home and just allow himself to "take a break", and practice looking for the positives versus the negatives, stay in contact with his friends that are supportive and inclusive of him in some of their  social gatherings,  and definitely work on not holding onto the negatives.  Goal review and progress/challenges noted with patient.  Next session within 3-4 weeks.   Shanon Ace, LCSW

## 2020-02-09 ENCOUNTER — Ambulatory Visit (INDEPENDENT_AMBULATORY_CARE_PROVIDER_SITE_OTHER): Payer: Medicare Other | Admitting: Psychiatry

## 2020-02-09 ENCOUNTER — Other Ambulatory Visit: Payer: Self-pay

## 2020-02-09 DIAGNOSIS — F411 Generalized anxiety disorder: Secondary | ICD-10-CM

## 2020-02-09 NOTE — Progress Notes (Signed)
[facility] Counselor/Therapist Progress Note  Patient ID: Cody Philmore Javious Terrana., MRN: [id number],    Date: 02/09/2020  Time Spent: 60 minutes   8:00am to 9:00am  Treatment Type: Individual Therapy  Reported Symptoms: anxiety, depression, hopeless "but not suicidal", some irritability  Mental Status Exam:  Appearance:   Casual     Behavior:  Appropriate and Sharing  Motor:  Normal  Speech/Language:   Normal Rate  Affect:  anxious, depressed, irritable  Mood:  anxious, depressed and irritable at times  Thought process:  goal directed  Thought content:    some ruminating   Sensory/Perceptual disturbances:    WNL  Orientation:  oriented to person, place, time/date, situation, day of week, month of year and year  Attention:  Good  Concentration:  Good and Fair  Memory:  forgetful but "not worse"  Fund of knowledge:   Good  Insight:    Good and Fair  Judgment:   Good  Impulse Control:  Good   Risk Assessment: Danger to Self:  No Self-injurious Behavior: No Danger to Others: No Duty to Warn:no Physical Aggression / Violence:No  Access to Firearms a concern: No  Gang Involvement:No   Subjective: Patient in today with anxiety, depression, some irritability, and frustrated.   Interventions: Cognitive Behavioral Therapy and Solution-Oriented/Positive Psychology  Diagnosis:   ICD-10-CM   1. Generalized anxiety disorder  F41.1      Plan: Patient is not signing tx goals on computer screen due to [place].  Treatment Goals: Goals remain on tx plan as patient works on strategies to achieve his goals. Progress is documented each session in the "Progress" section of Plan.   Long term goal: Develop healthy cognitive patterns and beliefs about self and the world that lead to alleviation and help prevent relapse of depression.  Short term goal: Identify and replace depressive thinking that leads to depressive feelings and actions.He will replace the depressive  thinking with more positive, hopeful thoughts that can lead to more positive and hopeful feelings.  Strategy: Patient to more consistently monitor his thoughts and catch the depressive/negative thoughts, trying to change them to positiveand more reality-based thought patterns. Look at his negative thoughts and weigh them against the evidence.  PROGRESS: Patient in today reporting anxiety, depression, frustrations, and feeling some "hopelessness but not suicidal". Living with wife after a period of being separated.  Conflicts have increased, per his report, and he says she brings up "old grudges, particularly that he never liked her parents".  States wife has told him that "we aren't really married."  Main source of happiness is the grandchildren and he does see them once or twice monthly and is touch by phone also. Seemed to appreciate being able to vent his feelings and frustrations today, and has no intention to leave the home at this time.  Still difficult for him to see any ways he may contribute to the tensions at home, or anything that he might could do differently that might help, and hard to "let go of things that have aggravated him." Seems to not have awareness of his need to let go of past negatives nor present situations that frustrate him. Encouraging him to follow through on medical recommendations per doctor that did his most recent back procedure.  Had one cataract surgery recently and to have the other one later this month. Encouraged contact with friends, follow up on all medical recommendations from his physicians, get outside some each day, walking, spending time with  his dog, and focus more on looking for some positives versus negatives, while working more intentionally on letting go of negatives as they feed his depression and anxiety.   Goal review and progress/challenges noted with patient.  Next appt within 3-4 weeks.   Shanon Ace,  LCSW

## 2020-02-26 DIAGNOSIS — H2512 Age-related nuclear cataract, left eye: Secondary | ICD-10-CM | POA: Diagnosis not present

## 2020-03-01 ENCOUNTER — Ambulatory Visit: Payer: Medicare Other | Admitting: Psychiatry

## 2020-03-01 DIAGNOSIS — H2512 Age-related nuclear cataract, left eye: Secondary | ICD-10-CM | POA: Diagnosis not present

## 2020-03-14 DIAGNOSIS — H16222 Keratoconjunctivitis sicca, not specified as Sjogren's, left eye: Secondary | ICD-10-CM | POA: Diagnosis not present

## 2020-03-14 DIAGNOSIS — Z961 Presence of intraocular lens: Secondary | ICD-10-CM | POA: Diagnosis not present

## 2020-03-20 ENCOUNTER — Ambulatory Visit (INDEPENDENT_AMBULATORY_CARE_PROVIDER_SITE_OTHER): Payer: Medicare Other | Admitting: Psychiatry

## 2020-03-20 ENCOUNTER — Other Ambulatory Visit: Payer: Self-pay

## 2020-03-20 DIAGNOSIS — F411 Generalized anxiety disorder: Secondary | ICD-10-CM

## 2020-03-20 NOTE — Progress Notes (Signed)
[facility] Counselor/Therapist Progress Note  Patient ID: Cody Hines Colbi Staubs., MRN: [id number],    Date: 03/20/2020  Time Spent: 60 minutes  11:00am to 12:00noon  Treatment Type: Individual Therapy  Reported Symptoms: anxiety, depression   Mental Status Exam:  Appearance:   Casual     Behavior:  Sharing  Motor:  A bit slowed  Speech/Language:   Normal Rate  Affect:  anxious, depressed  Mood:  anxious and depressed  Thought process:  normal  Thought content:    some ruminating  Sensory/Perceptual disturbances:    WNL  Orientation:  oriented to person, place, time/date, situation, day of week, month of year and year  Attention:  Fair per patient report and observation  Concentration:  Fair per patient report and observation  Memory:  some forgetfulness-"no change"  Fund of knowledge:   Good  Insight:    Good and Fair  Judgment:   Good and Fair  Impulse Control:  Good and Fair   Risk Assessment: Danger to Self:  No Self-injurious Behavior: No Danger to Others: No Duty to Warn:no Physical Aggression / Violence:No  Access to Firearms a concern: No  Gang Involvement:No   Subjective:  Patient in today reporting anxiety primarily, but also depression, and very critical of most things.   Interventions: Solution-Oriented/Positive Psychology and Ego-Supportive  Diagnosis:   ICD-10-CM   1. Generalized anxiety disorder  F41.1      Plan: Patient is not signing tx goals on computer screen due to [place].  Treatment Goals: Goals remain on tx plan as patient works on strategies to achieve his goals. Progress is documented each session in the "Progress" section of Plan.   Long term goal: Develop healthy cognitive patterns and beliefs about self and the world that lead to alleviation and help prevent relapse of depression.  Short term goal: Identify and replace depressive thinking that leads to depressive feelings and actions.He will replace the  depressive thinking with more positive, hopeful thoughts that can lead to more positive and hopeful feelings.  Strategy: Patient to more consistently monitor his thoughts and catch the depressive/negative thoughts, trying to change them to positiveand more reality-based thought patterns. Look at his negative thoughts and weigh them against the evidence.  PROGRESS: Patient in today with a lot of anxiety and negativity. Some depression reported but patient states "it's more anxiety".  Denies SI. Does get frustrated at times with wife with whom he is still living "as room-mates". Says he intends to stay there for now even though they do not get along very well and patient is very critical of most things there.  He reports that he and wife still do not get along very well, referring to some very old grudges between the two of them. Patient's view of situation remains that he does not contribute to the tensions at home and is not aware of anything he can do to make the home situation better. Finds it very difficult to let go of negatives in the past nor more recently, but we did discuss this again today and he states he will try as he would like to be happier, and blames others for his unhappiness. Is still having some physical issues as he is healing after his 2nd cataract surgery, and a recent back outpatient procedure. Process some communication issues he experiences with wife, along with a couple suggestions re: listening to really hear what other person is saying rather that listening only to respond. Encouraged him to  practice this between sessions along with walking short distances as able, getting outside daily, trying to focus more on the positives versus the negatives,  not always responding to what he perceives as negative comments from wife, and try working again on his own self-talk to be more positive.   Goal review and progress/challenges/efforts noted with patient.  Next appt within 3-4  weeks.   Shanon Ace, LCSW

## 2020-04-04 DIAGNOSIS — I700 Atherosclerosis of aorta: Secondary | ICD-10-CM | POA: Insufficient documentation

## 2020-04-04 NOTE — Progress Notes (Signed)
FOLLOW UP  Assessment and Plan:   Atherosclerosis of aorta Per CT 06/2019 Control blood pressure, cholesterol, glucose, increase exercise.   Hypertension Well controlled with current medications  Monitor blood pressure at home; patient to call if consistently greater than 130/80 Continue DASH diet.   Reminder to go to the ER if any CP, SOB, nausea, dizziness, severe HA, changes vision/speech, left arm numbness and tingling and jaw pain.  Cholesterol Currently at LDL goal; continue statin; diet discussed for trigs Continue low cholesterol diet and exercise.  Check lipid panel at next visit  Prediabetes Continue diet and exercise.  Perform daily foot/skin check, notify office of any concerning changes.  Check A1C q17m; check next visit   Obesity with co morbidities Long discussion about weight loss, diet, and exercise Recommended diet heavy in fruits and veggies and low in animal meats, cheeses, and dairy products, appropriate calorie intake Discussed ideal weight for height He is working on low carb diet with sister, advised to add daily exercise aiming for at least 15-20 of intentional activity Will follow up in 3 months    Vitamin D Def Near goal at last visit;  Continue supplement for goal 60-100 Defer Vit D level  Depression/anxiety Follows with psych Continue medications Lifestyle discussed: diet/exerise, sleep hygiene, stress management, hydration  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 Further disposition pending results of labs. Discussed med's effects and SE's.   Over 30 minutes of exam, counseling, chart review, and critical decision making was performed.   Future Appointments  Date Time Provider [place]  04/05/2020  1:00 PM Shanon Ace, LCSW CP-CP None  04/19/2020  8:00 AM Shanon Ace,  LCSW CP-CP None  05/14/2020  9:00 AM Ward Givens, NP [facility] None  05/21/2020  9:00 AM Cottle, Billey Co., MD CP-CP None  05/22/2020  8:00 AM Shanon Ace, LCSW CP-CP None  07/12/2020 10:30 AM [name], MD [facility] None  09/26/2020  9:00 AM Liane Comber, NP [facility] None  01/09/2021  9:00 AM [name], MD [facility] None     ----------------------------------------------------------------------------------------------------------------------  HPI 74 y.o. male  presents for 3 month follow up on hypertension, cholesterol, prediabetes, obesity and vitamin D deficiency.   Had cataract surgery, Dr. Katy Fitch, bil - R went well, having some problems with L due to scarring on eye, some elevated pressure and dry eye, has had tear duct procedure that has helped.    He has been separated from wife for nearly 2 years until recently, fell off of a ladder and had T12 compression fracture, underwent surgical correction by Dr. Arnoldo Morale early December 2020, moved back in with wife (was separated) to avoid SNF, still having some problems related to this, but patient is deferring for now, manages with regular rest throughout the day and tylenol.   He is seeing Dr. Charlott Holler at [facility] and a therapist this as well, he is on zoloft, and his depression is somewhat better, now doing therapy once a month with Hassan Rowan. Daughter is psychologist.He was on lorazepam PRN, somewhat increased since back fracture. Also with essential tremor.   On CPAP for OSA , reports 100% compliance and that this is helping.   CT 06/2019 showed small R kidney and suspected RAS - discussed [place] but patient declines at this time due to financial limitations.   BMI  is Body mass index is 28.48 kg/m., he has been working on diet and exercise (walks as tolerated with back). Sister was diagnosed with T2DM and he has been joining her with dietary choices, was doing well but gaining weight since surgery and moving less.  Wt  Readings from Last 3 Encounters:  04/05/20 210 lb (95.3 kg)  01/03/20 202 lb (91.6 kg)  09/27/19 200 lb 6.4 oz (90.9 kg)   Has aortic atherosclerosis per CT 06/2019 His blood pressure has been controlled at home, today their BP is BP: 120/64  He does not workout. He denies chest pain, shortness of breath, dizziness.   He is on cholesterol medication (rosuvastain 20 mg every other day) and denies myalgias. His LDL cholesterol is at goal. The cholesterol last visit was:   Lab Results  Component Value Date   CHOL 132 01/03/2020   HDL 54 01/03/2020   LDLCALC 62 01/03/2020   TRIG 80 01/03/2020   CHOLHDL 2.4 01/03/2020    He has been working on diet and exercise for hx of prediabetes, and denies foot ulcerations, increased appetite, nausea, paresthesia of the feet, polydipsia, polyuria, visual disturbances, vomiting and weight loss. Last A1C in the office was:  Lab Results  Component Value Date   HGBA1C 5.6 01/03/2020    Last GFR:  Lab Results  Component Value Date   GFRNONAA 54 (L) 01/03/2020   Patient is on Vitamin D supplement.   Lab Results  Component Value Date   VD25OH 88 01/03/2020     He has had intermittent mild LFT elevation; has hx of negative hepatitis panel; discussed [place], would consider in the future, defer for now due to covid 19. He had abd CT 06/22/2019 which showed distended gallbladder without cholecystitis, no liver lesions, no mention of fatty liver -  Declines at this time, wants to work on weight/diet Lab Results  Component Value Date   ALT 29 01/03/2020   AST 44 (H) 01/03/2020   ALKPHOS 93 06/22/2019   BILITOT 0.8 01/03/2020     Current Medications:  Current Outpatient Medications on File Prior to Visit  Medication Sig  . acetaminophen (TYLENOL) 650 MG CR tablet Take 650 mg by mouth every 8 (eight) hours as needed for pain (or back pain or migraines).   Marland Kitchen aspirin EC 81 MG tablet Take 81 mg by mouth daily.  . Cholecalciferol (VITAMIN D3) 250 MCG (10000  UT) capsule Take 10,000 Units by mouth daily.  . enalapril (VASOTEC) 20 MG tablet Take 1 tablet Daily for BP (Patient taking differently: Take 20 mg by mouth daily. )  . Flaxseed, Linseed, (FLAX SEED OIL) 1000 MG CAPS Take 2,000 mg by mouth daily.   Marland Kitchen LORazepam (ATIVAN) 1 MG tablet Take 1 tablet (1 mg total) by mouth at bedtime.  . Magnesium 500 MG TABS Take 500 mg by mouth daily.   . rosuvastatin (CRESTOR) 40 MG tablet Take 1 tablet Daily for Cholesterol  . sertraline (ZOLOFT) 100 MG tablet TAKE 2 TABLETS BY MOUTH DAILY   No current facility-administered medications on file prior to visit.     Allergies:  Allergies  Allergen Reactions  . Asa [Aspirin] Diarrhea, Nausea Only and Other (See Comments)    History of ulcers, also  . Atorvastatin Other (See Comments)    Muscle aches  . Celebrex [Celecoxib] Other (See Comments)    Headaches   . Codeine Nausea And Vomiting  . Savella [Milnacipran Hcl] Other (See Comments)    Reaction not recalled  .  Viagra [Sildenafil Citrate] Other (See Comments)    Headaches  . Penicillins Rash and Other (See Comments)     Medical History:  Past Medical History:  Diagnosis Date  . BPH (benign prostatic hyperplasia)   . Cancer (HCC)    nose, skin cancer  . Elevated hemoglobin A1c   . Fibromyalgia   . GERD (gastroesophageal reflux disease)   . Hyperlipidemia   . Hypertension   . Hypogonadism male   . IBS (irritable bowel syndrome)   . OSA (obstructive sleep apnea)    Family history- Reviewed and unchanged Social history- Reviewed and unchanged   Review of Systems:  Review of Systems  Constitutional: Negative for malaise/fatigue and weight loss.  HENT: Negative for hearing loss and tinnitus.   Eyes: Negative for blurred vision and double vision.  Respiratory: Negative for cough, shortness of breath and wheezing.   Cardiovascular: Negative for chest pain, palpitations, orthopnea, claudication and leg swelling.  Gastrointestinal: Negative  for abdominal pain, blood in stool, constipation, diarrhea, heartburn, melena, nausea and vomiting.  Genitourinary: Negative.   Musculoskeletal: Positive for back pain. Negative for joint pain and myalgias.  Skin: Negative for rash.  Neurological: Negative for dizziness, tingling, sensory change, weakness and headaches.  Endo/Heme/Allergies: Negative for polydipsia.  Psychiatric/Behavioral: Negative for depression, substance abuse and suicidal ideas. The patient is not nervous/anxious.   All other systems reviewed and are negative.   Physical Exam: BP 120/64   Pulse 62   Temp (!) 97.3 F (36.3 C)   Wt 210 lb (95.3 kg)   SpO2 96%   BMI 28.48 kg/m  Wt Readings from Last 3 Encounters:  04/05/20 210 lb (95.3 kg)  01/03/20 202 lb (91.6 kg)  09/27/19 200 lb 6.4 oz (90.9 kg)   General appearance: alert, no distress, WD/WN, male HEENT: normocephalic, sclerae anicteric, TMs pearly, nares patent, no discharge or erythema, pharynx normal Oral cavity: MMM, no lesions Neck: supple, no lymphadenopathy, no thyromegaly, no masses Heart: RRR, normal S1, S2, no murmurs Lungs: CTA bilaterally, no wheezes, rhonchi, or rales Abdomen: +bs, soft, non tender, non distended, no masses, no hepatomegaly, no splenomegaly Musculoskeletal: nontender, no swelling, no obvious deformity Extremities: no edema, no cyanosis, no clubbing Pulses: 2+ symmetric, upper and lower extremities, normal cap refill Neurological: alert, oriented x 3, CN2-12 intact, strength normal upper extremities and lower extremities, sensation normal throughout, DTRs 2+ throughout, no cerebellar signs, gait slow steady. Has chronic resting tremor of LUE.  Psychiatric: depressed affect, behavior normal, pleasant    Izora Ribas, NP 9:58 AM [facility]

## 2020-04-05 ENCOUNTER — Ambulatory Visit (INDEPENDENT_AMBULATORY_CARE_PROVIDER_SITE_OTHER): Payer: Medicare Other | Admitting: Adult Health

## 2020-04-05 ENCOUNTER — Other Ambulatory Visit: Payer: Self-pay

## 2020-04-05 ENCOUNTER — Ambulatory Visit (INDEPENDENT_AMBULATORY_CARE_PROVIDER_SITE_OTHER): Payer: Medicare Other | Admitting: Psychiatry

## 2020-04-05 ENCOUNTER — Encounter: Payer: Self-pay | Admitting: Adult Health

## 2020-04-05 VITALS — BP 120/64 | HR 62 | Temp 97.3°F | Wt 210.0 lb

## 2020-04-05 DIAGNOSIS — G250 Essential tremor: Secondary | ICD-10-CM

## 2020-04-05 DIAGNOSIS — F411 Generalized anxiety disorder: Secondary | ICD-10-CM | POA: Diagnosis not present

## 2020-04-05 DIAGNOSIS — N270 Small kidney, unilateral: Secondary | ICD-10-CM | POA: Insufficient documentation

## 2020-04-05 DIAGNOSIS — E663 Overweight: Secondary | ICD-10-CM

## 2020-04-05 DIAGNOSIS — E559 Vitamin D deficiency, unspecified: Secondary | ICD-10-CM

## 2020-04-05 DIAGNOSIS — F331 Major depressive disorder, recurrent, moderate: Secondary | ICD-10-CM

## 2020-04-05 DIAGNOSIS — I700 Atherosclerosis of aorta: Secondary | ICD-10-CM | POA: Diagnosis not present

## 2020-04-05 DIAGNOSIS — I10 Essential (primary) hypertension: Secondary | ICD-10-CM

## 2020-04-05 DIAGNOSIS — E782 Mixed hyperlipidemia: Secondary | ICD-10-CM

## 2020-04-05 DIAGNOSIS — Z9989 Dependence on other enabling machines and devices: Secondary | ICD-10-CM

## 2020-04-05 DIAGNOSIS — R7309 Other abnormal glucose: Secondary | ICD-10-CM

## 2020-04-05 DIAGNOSIS — N1831 Chronic kidney disease, stage 3a: Secondary | ICD-10-CM

## 2020-04-05 DIAGNOSIS — G4733 Obstructive sleep apnea (adult) (pediatric): Secondary | ICD-10-CM | POA: Diagnosis not present

## 2020-04-05 DIAGNOSIS — Z79899 Other long term (current) drug therapy: Secondary | ICD-10-CM

## 2020-04-05 NOTE — Patient Instructions (Addendum)
Goals    . Blood Pressure < 140/90    . Exercise 3x per week (20 min per time)    . HEMOGLOBIN A1C < 5.7       High-Fiber Diet Fiber, also called dietary fiber, is a type of carbohydrate that is found in fruits, vegetables, whole grains, and beans. A high-fiber diet can have many health benefits. Your health care provider may recommend a high-fiber diet to help:  Prevent constipation. Fiber can make your bowel movements more regular.  Lower your cholesterol.  Relieve the following conditions: ? Swelling of veins in the anus (hemorrhoids). ? Swelling and irritation (inflammation) of specific areas of the digestive tract (uncomplicated diverticulosis). ? A problem of the large intestine (colon) that sometimes causes pain and diarrhea (irritable bowel syndrome, IBS).  Prevent overeating as part of a weight-loss plan.  Prevent heart disease, type 2 diabetes, and certain cancers. What is my plan? The recommended daily fiber intake in grams (g) includes:  38 g for men age 90 or younger.  30 g for men over age 71.  16 g for women age 27 or younger.  21 g for women over age 42. You can get the recommended daily intake of dietary fiber by:  Eating a variety of fruits, vegetables, grains, and beans.  Taking a fiber supplement, if it is not possible to get enough fiber through your diet. What do I need to know about a high-fiber diet?  It is better to get fiber through food sources rather than from fiber supplements. There is not a lot of research about how effective supplements are.  Always check the fiber content on the nutrition facts label of any prepackaged food. Look for foods that contain 5 g of fiber or more per serving.  Talk with a diet and nutrition specialist (dietitian) if you have questions about specific foods that are recommended or not recommended for your medical condition, especially if those foods are not listed below.  Gradually increase how much fiber you  consume. If you increase your intake of dietary fiber too quickly, you may have bloating, cramping, or gas.  Drink plenty of water. Water helps you to digest fiber. What are tips for following this plan?  Eat a wide variety of high-fiber foods.  Make sure that half of the grains that you eat each day are whole grains.  Eat breads and cereals that are made with whole-grain flour instead of refined flour or white flour.  Eat brown rice, bulgur wheat, or millet instead of white rice.  Start the day with a breakfast that is high in fiber, such as a cereal that contains 5 g of fiber or more per serving.  Use beans in place of meat in soups, salads, and pasta dishes.  Eat high-fiber snacks, such as berries, raw vegetables, nuts, and popcorn.  Choose whole fruits and vegetables instead of processed forms like juice or sauce. What foods can I eat?  Fruits Berries. Pears. Apples. Oranges. Avocado. Prunes and raisins. Dried figs. Vegetables Sweet potatoes. Spinach. Kale. Artichokes. Cabbage. Broccoli. Cauliflower. Green peas. Carrots. Squash. Grains Whole-grain breads. Multigrain cereal. Oats and oatmeal. Brown rice. Barley. Bulgur wheat. [place]. Quinoa. Bran muffins. Popcorn. Rye wafer crackers. Meats and other proteins Navy, kidney, and pinto beans. Soybeans. Split peas. Lentils. Nuts and seeds. Dairy Fiber-fortified yogurt. Beverages Fiber-fortified soy milk. Fiber-fortified orange juice. Other foods Fiber bars. The items listed above may not be a complete list of recommended foods and beverages. Contact a  dietitian for more options. What foods are not recommended? Fruits Fruit juice. Cooked, strained fruit. Vegetables Fried potatoes. Canned vegetables. Well-cooked vegetables. Grains White bread. Pasta made with refined flour. White rice. Meats and other proteins Fatty cuts of meat. Fried chicken or fried fish. Dairy Milk. Yogurt. Cream cheese. Sour cream. Fats and  oils Butters. Beverages Soft drinks. Other foods Cakes and pastries. The items listed above may not be a complete list of foods and beverages to avoid. Contact a dietitian for more information. Summary  Fiber is a type of carbohydrate. It is found in fruits, vegetables, whole grains, and beans.  There are many health benefits of eating a high-fiber diet, such as preventing constipation, lowering blood cholesterol, helping with weight loss, and reducing your risk of heart disease, diabetes, and certain cancers.  Gradually increase your intake of fiber. Increasing too fast can result in cramping, bloating, and gas. Drink plenty of water while you increase your fiber.  The best sources of fiber include whole fruits and vegetables, whole grains, nuts, seeds, and beans. This information is not intended to replace advice given to you by your health care provider. Make sure you discuss any questions you have with your health care provider. Document Revised: 07/27/2017 Document Reviewed: 07/27/2017 Elsevier Patient Education  [place].      Renal Artery Stenosis Renal artery stenosis (RAS) is a narrowing of the artery that carries blood to the kidneys. It can affect one or both kidneys. The kidneys filter waste and extra fluid from the blood. Waste and fluid are then removed when a person passes urine. The kidneys also make an important chemical messenger (hormone) called renin. Renin helps regulate blood pressure. The first sign of RAS may be high blood pressure. Other symptoms can develop over time. What are the causes? A common cause of this condition is plaque buildup in your arteries (atherosclerosis). The plaques that cause this are made up of:  Fat.  Cholesterol.  Calcium.  Other substances. As these substances build up in your renal artery, the blood supply to your kidneys slows. The lack of blood and oxygen causes the signs and symptoms of RAS. A much less common  cause of RAS is a disease called fibromuscular dysplasia. This disease causes abnormal cell growth that narrows the renal artery. It is not related to atherosclerosis. It occurs mostly in women who are 72-72 years old. It may be passed down through families.  What increases the risk? You are more likely to develop this condition if you:  Are a man who is at least 72 years old.  Are a woman who is at least 72 years old.  Have high blood pressure.  Have high cholesterol.  Are a smoker.  Abuse alcohol.  Have diabetes or prediabetes.  Are overweight or obese.  Have a family history of early heart disease. What are the signs or symptoms? RAS usually develops slowly. You may not have any signs or symptoms at first. Early signs may include:  Development of high blood pressure.  A sudden increase in existing high blood pressure.  No longer responding to medicine that used to control your blood pressure. Later signs and symptoms are due to kidney damage. They may include:  Feeling tired (fatigue).  Shortness of breath.  Swollen legs and feet.  Dry skin.  Headaches.  Muscle cramps.  Loss of appetite.  Nausea or vomiting. How is this diagnosed? This condition may be diagnosed based on:  Your symptoms and medical  history. Your health care provider may suspect RAS based on changes in your blood pressure and your risk factors.  A physical exam. During the exam, your health care provider will use a stethoscope to listen for a whooshing sound (bruit) that can occur where the renal artery is blocking blood flow.  Various tests. These may include: ? Blood and urine tests to check your kidney function. ? Imaging tests of your kidneys, such as:  A test that uses sound waves to create an image of your kidneys and the blood flow to your kidneys (ultrasound).  A test in which dye is injected into one of your blood vessels so images can be taken as the dye flows through your renal  arteries (angiogram). This can be done using X-rays, a CT scan (computed tomography angiogram, CTA), or a type of MRI (magnetic resonance angiogram, MRA). How is this treated? Making lifestyle changes to reduce your risk factors is the first treatment option for early RAS. If the blood flow to one of your kidneys is cut by more than half, you may need medicine to:  Lower your blood pressure. This is the main medical treatment for RAS. You may need more than one type of medicine for this. The types that work best for people with RAS are: ? ACE inhibitors. ? Angiotensin receptor blockers.  Reduce fluid in the body (diuretics).  Lower your cholesterol (statins). If medicine is not enough to control RAS, you may need surgery. This may involve:  Threading a tube with an inflatable balloon into the renal artery to force it to open (angioplasty).  Removing plaque from inside the artery (endarterectomy). Follow these instructions at home:  Lifestyle  Make any lifestyle changes recommended by your health care provider. This may include: ? Working with a dietitian to maintain a heart-healthy diet. This type of diet is low in saturated fat, salt, and added sugar. ? Starting an exercise program as directed by your health care provider. ? Maintaining a healthy weight. ? Quitting smoking. ? Not abusing alcohol. General instructions  Take over-the-counter and prescription medicines only as told by your health care provider.  Keep all follow-up visits as told by your health care provider. This is important. Contact a health care provider if:  Your symptoms of RAS are not getting better.  Your symptoms are changing or getting worse. Get help right away if you have:  Very bad pain in your back or abdomen.  Blood in your urine. Summary  Renal artery stenosis (RAS) is a narrowing of the artery that carries blood to the kidneys. It can affect one or both kidneys.  RAS usually develops slowly.  You may not have any signs or symptoms at first, but high blood pressure that is difficult to control is a key symptom.  Making lifestyle changes to reduce your risk factors is the first treatment option for early RAS. If the blood flow to one of your kidneys is cut by more than half, you may need medicines to help manage your cholesterol and blood pressure. This information is not intended to replace advice given to you by your health care provider. Make sure you discuss any questions you have with your health care provider. Document Revised: 10/19/2017 Document Reviewed: 10/19/2017 Elsevier Patient Education  2020 [employer].

## 2020-04-05 NOTE — Progress Notes (Signed)
      [facility] Counselor/Therapist Progress Note  Patient ID: Mitch Philmore Seung Nidiffer., MRN: [id number],    Date: 04/05/2020  Time Spent: 45  minutes   1:15pm to 2:00pm  Treatment Type: Individual Therapy  Reported Symptoms: anxiety, depression  Mental Status Exam:  Appearance:   Casual     Behavior:  Appropriate and Sharing  Motor:  Normal  Speech/Language:   Normal Rate  Affect:  anxious, depresssed  Mood:  anxious and depressed  Thought process:  normal  Thought content:    some rumination  Sensory/Perceptual disturbances:    WNL  Orientation:  oriented to person, place, time/date, situation, day of week, month of year and year  Attention:  Good  Concentration:  Good  Memory:  Some forgetfulness and worse when I'm stressed  Fund of knowledge:   Good  Insight:    Good and Fair  Judgment:   Good and Fair  Impulse Control:  Good   Risk Assessment: Danger to Self:  No Self-injurious Behavior: No Danger to Others: No Duty to Warn:no Physical Aggression / Violence:No  Access to Firearms a concern: No  Gang Involvement:No   Subjective: Patient today reports anxiety and depression, mostly related to personal and family situations.  Interventions: Cognitive Behavioral Therapy and Solution-Oriented/Positive Psychology  Diagnosis:   ICD-10-CM   1. Generalized anxiety disorder  F41.1      Plan: Patient is not signing tx goals on computer screen due to [place].  Treatment Goals: Goals remain on tx plan as patient works on strategies to achieve his goals. Progress is documented each session in the "Progress" section of Plan.   Long term goal: Develop healthy cognitive patterns and beliefs about self and the world that lead to alleviation and help prevent relapse of depression.  Short term goal: Identify and replace depressive thinking that leads to depressive feelings and actions.He will replace the depressive thinking with more positive, hopeful thoughts  that can lead to more positive and hopeful feelings.  Strategy: Patient to more consistently monitor his thoughts and catch the depressive/negative thoughts, trying to change them to positiveand more reality-based thought patterns. Look at his negative thoughts and weigh them against the evidence.  PROGRESS: Patient in today reporting depression and anxiety mostly related to personal and family issues. Continued problems living with his wife as room-mates after having been separated from her. Patient finds it difficult to try anything different in relation to he and his wife living together as he feels he has tried everything.  Doing ok since cataract surgery, still having some back pain after recent injection. He did share more today of anxious/depressive thoughts and feelings today and eventually he was less negative by session end.  Encouraged patient in his self-care especially to maintain contact with his adult children and grandkids, other supportive friends, getting out of the house everyday as he is not able to return to work anymore.   Goal review and progress noted.  Next appt within 3 weeks today.   Shanon Ace, LCSW

## 2020-04-06 LAB — COMPLETE METABOLIC PANEL WITH GFR
AG Ratio: 1.8 (calc) (ref 1.0–2.5)
ALT: 29 U/L (ref 9–46)
AST: 44 U/L — ABNORMAL HIGH (ref 10–35)
Albumin: 4.2 g/dL (ref 3.6–5.1)
Alkaline phosphatase (APISO): 85 U/L (ref 35–144)
BUN/Creatinine Ratio: 6 (calc) (ref 6–22)
BUN: 9 mg/dL (ref 7–25)
CO2: 30 mmol/L (ref 20–32)
Calcium: 9.9 mg/dL (ref 8.6–10.3)
Chloride: 106 mmol/L (ref 98–110)
Creat: 1.41 mg/dL — ABNORMAL HIGH (ref 0.70–1.18)
GFR, Est African American: 58 mL/min/{1.73_m2} — ABNORMAL LOW (ref >=60)
GFR, Est Non African American: 50 mL/min/{1.73_m2} — ABNORMAL LOW (ref >=60)
Globulin: 2.3 g/dL (calc) (ref 1.9–3.7)
Glucose, Bld: 105 mg/dL — ABNORMAL HIGH (ref 65–99)
Potassium: 4.1 mmol/L (ref 3.5–5.3)
Sodium: 139 mmol/L (ref 135–146)
Total Bilirubin: 0.9 mg/dL (ref 0.2–1.2)
Total Protein: 6.5 g/dL (ref 6.1–8.1)

## 2020-04-06 LAB — MAGNESIUM: Magnesium: 2.2 mg/dL (ref 1.5–2.5)

## 2020-04-06 LAB — CBC WITH DIFFERENTIAL/PLATELET
Absolute Monocytes: 443 cells/uL (ref 200–950)
Basophils Absolute: 22 cells/uL (ref 0–200)
Basophils Relative: 0.5 %
Eosinophils Absolute: 112 cells/uL (ref 15–500)
Eosinophils Relative: 2.6 %
HCT: 38.5 % (ref 38.5–50.0)
Hemoglobin: 12.6 g/dL — ABNORMAL LOW (ref 13.2–17.1)
Lymphs Abs: 1514 cells/uL (ref 850–3900)
MCH: 30.2 pg (ref 27.0–33.0)
MCHC: 32.7 g/dL (ref 32.0–36.0)
MCV: 92.3 fL (ref 80.0–100.0)
MPV: 11.2 fL (ref 7.5–12.5)
Monocytes Relative: 10.3 %
Neutro Abs: 2210 cells/uL (ref 1500–7800)
Neutrophils Relative %: 51.4 %
Platelets: 115 10*3/uL — ABNORMAL LOW (ref 140–400)
RBC: 4.17 10*6/uL — ABNORMAL LOW (ref 4.20–5.80)
RDW: 13.5 % (ref 11.0–15.0)
Total Lymphocyte: 35.2 %
WBC: 4.3 10*3/uL (ref 3.8–10.8)

## 2020-04-06 LAB — LIPID PANEL
Cholesterol: 111 mg/dL (ref <200)
HDL: 60 mg/dL (ref >=40)
LDL Cholesterol (Calc): 38 mg/dL (calc)
Non-HDL Cholesterol (Calc): 51 mg/dL (calc) (ref <130)
Total CHOL/HDL Ratio: 1.9 (calc) (ref <5.0)
Triglycerides: 57 mg/dL (ref <150)

## 2020-04-06 LAB — TSH: TSH: 2.05 mIU/L (ref 0.40–4.50)

## 2020-04-19 ENCOUNTER — Ambulatory Visit (INDEPENDENT_AMBULATORY_CARE_PROVIDER_SITE_OTHER): Payer: Medicare Other | Admitting: Psychiatry

## 2020-04-19 ENCOUNTER — Other Ambulatory Visit: Payer: Self-pay

## 2020-04-19 DIAGNOSIS — F411 Generalized anxiety disorder: Secondary | ICD-10-CM

## 2020-04-19 NOTE — Progress Notes (Signed)
      [facility] Counselor/Therapist Progress Note  Patient ID: Cody Philmore Caydon Feasel., MRN: [id number],    Date: 04/19/2020  Time Spent: 60 minutes   8:00am to 9:00am  Treatment Type: Individual Therapy  Reported Symptoms: anxiety, depression "about equal"  Mental Status Exam:  Appearance:   Casual     Behavior:  Appropriate and Sharing  Motor:  Normal  Speech/Language:   Normal Rate  Affect:  anxiety, depressed  Mood:  anxious and depressed  Thought process:  normal  Thought content:    WNL  Sensory/Perceptual disturbances:    WNL  Orientation:  oriented to person, place, time/date, situation, day of week, month of year and year  Attention:  Good  Concentration:  Fair  Memory:  some forgetfulness, worse under stress  Fund of knowledge:   Good  Insight:    Fair  Judgment:   Good and Fair  Impulse Control:  Good and Fair   Risk Assessment: Danger to Self:  No  Self-injurious Behavior: No Danger to Others: No Duty to Warn:no Physical Aggression / Violence:No  Access to Firearms a concern: No  Gang Involvement:No   Subjective:  Patient today reports anxiety and depression related to family and personal situations and his back is "not really any better even after his procedure."  Interventions: Cognitive Behavioral Therapy and Solution-Oriented/Positive Psychology  Diagnosis:   ICD-10-CM   1. Generalized anxiety disorder  F41.1      Plan: Patient is not signing tx goals on computer screen due to [place].  Treatment Goals: Goals remain on tx plan as patient works on strategies to achieve his goals. Progress is documented each session in the "Progress" section of Plan.   Long term goal: Develop healthy cognitive patterns and beliefs about self and the world that lead to alleviation and help prevent relapse of depression.  Short term goal: Identify and replace depressive thinking that leads to depressive feelings and actions.He will replace the  depressive thinking with more positive, hopeful thoughts that can lead to more positive and hopeful feelings.  Strategy: Patient to more consistently monitor his thoughts and catch the depressive/negative thoughts, trying to change them to positiveand more reality-based thought patterns. Look at his negative thoughts and weigh them against the evidence.  PROGRESS: Patient in today sharing that his anxiety and depression continue with "some times tieing worse and occasionally some times are better."  Still having some hard times living in same house as wife, living "as room-mates", as "we don't really get along a lot of the tie, but some times are better just not frequent."  States he plans to stay there at the home and has decided now that he will not move out.  "Doing ok since cataract surgery, but am calling doctor as my back is still bothering me lot.".Tries to find things to occupy himself and is in touch with son and daughter often, and some "old buddies" he has had over the years. Encouraged him to consider the things he can change and let go of what he can't change, as well as more positive self-talk.  Goal review and progress/challenges noted with patient.  Next appt within 3-4 weeks.   Shanon Ace, LCSW

## 2020-04-29 ENCOUNTER — Other Ambulatory Visit: Payer: Self-pay | Admitting: Internal Medicine

## 2020-05-09 ENCOUNTER — Encounter: Payer: Self-pay | Admitting: Adult Health

## 2020-05-14 ENCOUNTER — Ambulatory Visit (INDEPENDENT_AMBULATORY_CARE_PROVIDER_SITE_OTHER): Payer: Medicare Other | Admitting: Adult Health

## 2020-05-14 ENCOUNTER — Encounter: Payer: Self-pay | Admitting: Adult Health

## 2020-05-14 VITALS — BP 132/62 | HR 60 | Ht 73.0 in | Wt 197.0 lb

## 2020-05-14 DIAGNOSIS — G4733 Obstructive sleep apnea (adult) (pediatric): Secondary | ICD-10-CM | POA: Diagnosis not present

## 2020-05-14 DIAGNOSIS — Z9989 Dependence on other enabling machines and devices: Secondary | ICD-10-CM | POA: Diagnosis not present

## 2020-05-14 NOTE — Patient Instructions (Signed)
Continue using CPAP nightly and greater than 4 hours each night °If your symptoms worsen or you develop new symptoms please let us know.  ° °

## 2020-05-14 NOTE — Progress Notes (Signed)
PATIENT: Cody Hines. DOB: 1946/05/08  REASON FOR VISIT: follow up HISTORY FROM: patient  HISTORY OF PRESENT ILLNESS: Today 05/14/20:  Cody Hines is a 74 year old male with a history of obstructive sleep apnea on CPAP.  His download indicates that he use his machine for 30 days for compliance of 73%.  He uses machine greater than 4 hours 17 days for compliance of 57%.  On average he uses his machine 5 hours and 9 minutes.  His residual AHI is 1.2 on 11 cm of water with EPR to leak in the 95th percentile is 34.1 L/min.  Reports that he has had a migraine for 2 days.  Reports that he has a remote history of migraine that has gotten worse since he had eye surgery.    HISTORY08/04/20:  Cody Hines is a 74 year old male with a history of obstructive sleep apnea on CPAP.  He returns today for follow-up.  His CPAP download indicates that he uses machine 30 out of 30 days for compliance of 100%.  He uses machine greater than 4 hours 16 days for compliance of 53%.  On average he uses his machine 4 hours and 18 minutes.  His residual AHI is 0.7 on 11 cm of water with EPR of 2.  His leak in the 95th percentile is 19.1 liters per minute.  He states that there are some nights that he unknowingly takes off his mask.  He states that he wakes up from notices this he does put the mask back on.  He also notes that some nights he has allergies and finds it is hard to keep the nasal pillows on.  He returns today for follow-up   REVIEW OF SYSTEMS: Out of a complete 14 system review of symptoms, the patient complains only of the following symptoms, and all other reviewed systems are negative.  FSS 39 ESS 10  ALLERGIES: Allergies  Allergen Reactions  . Asa [Aspirin] Diarrhea, Nausea Only and Other (See Comments)    History of ulcers, also  . Atorvastatin Other (See Comments)    Muscle aches  . Celebrex [Celecoxib] Other (See Comments)    Headaches   . Codeine Nausea And Vomiting  . Savella  [Milnacipran Hcl] Other (See Comments)    Reaction not recalled  . Viagra [Sildenafil Citrate] Other (See Comments)    Headaches  . Penicillins Rash and Other (See Comments)    HOME MEDICATIONS: Outpatient Medications Prior to Visit  Medication Sig Dispense Refill  . acetaminophen (TYLENOL) 650 MG CR tablet Take 650 mg by mouth every 8 (eight) hours as needed for pain (or back pain or migraines).     Marland Kitchen aspirin EC 81 MG tablet Take 81 mg by mouth daily.    . Cholecalciferol (VITAMIN D3) 250 MCG (10000 UT) capsule Take 10,000 Units by mouth daily.    . enalapril (VASOTEC) 20 MG tablet Take 1 tablet Daily for BP 90 tablet 0  . Flaxseed, Linseed, (FLAX SEED OIL) 1000 MG CAPS Take 2,000 mg by mouth daily.     Marland Kitchen LORazepam (ATIVAN) 1 MG tablet Take 1 tablet (1 mg total) by mouth at bedtime. (Patient taking differently: Take 1 mg by mouth at bedtime. As needed) 30 tablet 5  . Magnesium 500 MG TABS Take 500 mg by mouth daily.     . rosuvastatin (CRESTOR) 40 MG tablet Take 1 tablet Daily for Cholesterol 90 tablet 3  . sertraline (ZOLOFT) 100 MG tablet TAKE 2 TABLETS BY MOUTH  DAILY 180 tablet 1   No facility-administered medications prior to visit.    PAST MEDICAL HISTORY: Past Medical History:  Diagnosis Date  . BPH (benign prostatic hyperplasia)   . Cancer (HCC)    nose, skin cancer  . Elevated hemoglobin A1c   . Fibromyalgia   . GERD (gastroesophageal reflux disease)   . Hyperlipidemia   . Hypertension   . Hypogonadism male   . IBS (irritable bowel syndrome)   . OSA (obstructive sleep apnea)     PAST SURGICAL HISTORY: Past Surgical History:  Procedure Laterality Date  .  nasal smr np3  1985  . CATARACT EXTRACTION, BILATERAL Bilateral 2021   Dr. Katy Fitch, L 5/27, R 3/25  . KNEE ARTHROSCOPY Left 1999  . SKIN CANCER EXCISION  2020   nose  . SPINE SURGERY  2007   L5 S 1 Disk  . SPINE SURGERY  09/2019   Compression fracture stabilization by Dr. Arnoldo Morale  . VASECTOMY  1983     FAMILY HISTORY: Family History  Problem Relation Age of Onset  . Cancer Mother        breast  . Cancer Father        lung  . Diabetes Father   . Heart disease Sister   . Arthritis Sister     SOCIAL HISTORY: Social History   Socioeconomic History  . Marital status: Married    Spouse name: Kennyth Lose  . Number of children: Not on file  . Years of education: Not on file  . Highest education level: Not on file  Occupational History  . 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 Smoking status: Former Smoker    Years: 30.00    Types: Cigarettes    Quit date: 10/06/1980    Years since quitting: 39.6  . Smokeless tobacco: Never Used  Vaping Use  . Vaping Use: Never used  Substance and Sexual Activity  . Alcohol use: No    Comment: Not drinking x 1 month  . Drug use: No  . Sexual activity: Not Currently  Other Topics Concern  . Not on file  Social History Narrative  . Not on file   Social Determinants of Health   Financial Resource Strain:   . Difficulty of Paying Living Expenses:   Food Insecurity:   . Worried About Charity fundraiser in the Last Year:   . Arboriculturist in the Last Year:   Transportation Needs:   . Film/video editor (Medical):   Marland Kitchen Lack of Transportation (Non-Medical):   Physical Activity:   . Days of Exercise per Week:   . Minutes of Exercise per Session:   Stress:   . Feeling of Stress :   Social Connections:   . Frequency of Communication with Friends and Family:   . Frequency of Social Gatherings with Friends and Family:   . Attends Religious Services:   . Active Member of Clubs or Organizations:   . Attends Archivist Meetings:   Marland Kitchen Marital Status:   Intimate Partner Violence:   . Fear of Current or Ex-Partner:   . Emotionally Abused:   Marland Kitchen Physically Abused:   . Sexually Abused:       PHYSICAL EXAM  Vitals:   05/14/20 0907  BP: 132/62  Pulse: 60  Weight: 197 lb (89.4 kg)  Height: 6\' 1"  (1.854 m)   Body mass  index is 25.99 kg/m.  Generalized: Well developed, in no acute distress  Chest: Lungs clear  to auscultation bilaterally  Neurological examination  Mentation: Alert oriented to time, place, history taking. Follows all commands speech and language fluent Cranial nerve II-XII: Extraocular movements were full, visual field were full on confrontational test Head turning and shoulder shrug  were normal and symmetric. Motor: The motor testing reveals 5 over 5 strength of all 4 extremities. Good symmetric motor tone is noted throughout.  Sensory: Sensory testing is intact to soft touch on all 4 extremities. No evidence of extinction is noted.  Gait and station: Gait is normal.    DIAGNOSTIC DATA (LABS, IMAGING, TESTING) - I reviewed patient records, labs, notes, testing and imaging myself where available.  Lab Results  Component Value Date   WBC 4.3 04/05/2020   HGB 12.6 (L) 04/05/2020   HCT 38.5 04/05/2020   MCV 92.3 04/05/2020   PLT 115 (L) 04/05/2020      Component Value Date/Time   NA 139 04/05/2020 1015   K 4.1 04/05/2020 1015   CL 106 04/05/2020 1015   CO2 30 04/05/2020 1015   GLUCOSE 105 (H) 04/05/2020 1015   BUN 9 04/05/2020 1015   CREATININE 1.41 (H) 04/05/2020 1015   CALCIUM 9.9 04/05/2020 1015   PROT 6.5 04/05/2020 1015   ALBUMIN 3.9 06/22/2019 2128   AST 44 (H) 04/05/2020 1015   ALT 29 04/05/2020 1015   ALKPHOS 93 06/22/2019 2128   BILITOT 0.9 04/05/2020 1015   GFRNONAA 50 (L) 04/05/2020 1015   GFRAA 58 (L) 04/05/2020 1015   Lab Results  Component Value Date   CHOL 111 04/05/2020   HDL 60 04/05/2020   LDLCALC 38 04/05/2020   TRIG 57 04/05/2020   CHOLHDL 1.9 04/05/2020   Lab Results  Component Value Date   HGBA1C 5.6 01/03/2020   Lab Results  Component Value Date   VITAMINB12 370 07/19/2019   Lab Results  Component Value Date   TSH 2.05 04/05/2020      ASSESSMENT AND PLAN 74 y.o. year old male  has a past medical history of BPH (benign prostatic  hyperplasia), Cancer ([facility] [place]), Elevated hemoglobin A1c, Fibromyalgia, GERD (gastroesophageal reflux disease), Hyperlipidemia, Hypertension, Hypogonadism male, IBS (irritable bowel syndrome), and OSA (obstructive sleep apnea). here with:  1. OSA on CPAP  - CPAP compliance suboptimal - Good treatment of AHI  - Encourage patient to use CPAP nightly and > 4 hours each night -Patient reports history of migraine headaches however we have never seen him for this.  Advised that if his headaches worsen he should follow up with his PCP and if our input is needed PCP can send over referral. - F/U in 1 year or sooner if needed   I spent 25 minutes of face-to-face and non-face-to-face time with patient.  This included previsit chart review, lab review, study review, order entry, electronic health record documentation, patient education.  Ward Givens, MSN, NP-C 05/14/2020, 9:09 AM [facility] [address], [place], [place] [address] [phone]

## 2020-05-17 ENCOUNTER — Other Ambulatory Visit: Payer: Self-pay | Admitting: Internal Medicine

## 2020-05-17 DIAGNOSIS — E782 Mixed hyperlipidemia: Secondary | ICD-10-CM

## 2020-05-21 ENCOUNTER — Other Ambulatory Visit: Payer: Self-pay

## 2020-05-21 ENCOUNTER — Ambulatory Visit (INDEPENDENT_AMBULATORY_CARE_PROVIDER_SITE_OTHER): Payer: Medicare Other | Admitting: Psychiatry

## 2020-05-21 ENCOUNTER — Encounter: Payer: Self-pay | Admitting: Psychiatry

## 2020-05-21 DIAGNOSIS — F5105 Insomnia due to other mental disorder: Secondary | ICD-10-CM

## 2020-05-21 DIAGNOSIS — F1021 Alcohol dependence, in remission: Secondary | ICD-10-CM

## 2020-05-21 DIAGNOSIS — F411 Generalized anxiety disorder: Secondary | ICD-10-CM

## 2020-05-21 DIAGNOSIS — F331 Major depressive disorder, recurrent, moderate: Secondary | ICD-10-CM | POA: Diagnosis not present

## 2020-05-21 DIAGNOSIS — F6381 Intermittent explosive disorder: Secondary | ICD-10-CM

## 2020-05-21 MED ORDER — SERTRALINE HCL 100 MG PO TABS: 200.0000 mg | ORAL_TABLET | Freq: Every day | ORAL | 2 refills | Status: DC

## 2020-05-21 MED ORDER — LORAZEPAM 1 MG PO TABS: 1.0000 mg | ORAL_TABLET | Freq: Every evening | ORAL | 5 refills | Status: DC | PRN

## 2020-05-21 NOTE — Progress Notes (Signed)
Cody Hines. [id number] 1946/03/22 74 y.o.  Subjective:   Patient ID:  Cody Hines Cody Hines. is a 74 y.o. (DOB 27-May-1946) male.  Chief Complaint:  Chief Complaint  Patient presents with  . Follow-up  . Anxiety  . Depression    Depression        Associated symptoms include headaches.  Associated symptoms include no decreased concentration and no suicidal ideas.   Cody Hines Cody Hines. presents to the office today for follow-up mood problems.  Last visit 11/21/19.  No med changes at last visit.  Still on sertraline 200 mg daily.   He's continued counseling.  05/21/20 appt with the following noted: Taking lorazepam 3-4 times weekly for sleep. About the same mentally, but physically back pain and can't do much and not much stamina from the fall he had before.  Fell off ladder while fixing downspout at wife's parents house and fx vertebra and wife took him back to care for him.   95% of time trying to get along with him.  Not losing his temper with her generally.    Able to see gkids more generally except for Covid with son.  Pt been vaccinated but still not good enough for son to see the gkids.     Overall is improved but sx are not resolved. Pt reports that mood is pretty even keel.  Things don't bother him like they used to.  Wife barely speaks to him since March. Anxiety in large groups of people has to get out.  Pt reports has interrupted sleep. Better sleep when not working. Goes to bed early. Trouble staying asleep.  Busy.  sUsually enough sleep.  Pt reports that appetite is good. Pt reports that energy is poor DT heat. Better than it was.   Concentration is good. Suicidal thoughts:  denied by patient.  Likes movies.   Less angry and resentful of others than he used to be. Outbursts are generally resolved. He believes he's handling anger better.  Wife not sure about it.  Enjoys [place]. Reduced hearing.  Therapy with [name] CSW helpful.  Drinks caffeinated  sodas all day until 9pm.  Sober 3 years in [place] 2021.  Past Psychiatric Medication Trials: citalopram, Wellbutrin, sertraline 200,  Xanax.  Review of Systems:  Review of Systems  Respiratory: Negative for cough.   Musculoskeletal: Positive for back pain and gait problem.  Neurological: Positive for tremors and headaches. Negative for weakness.  Psychiatric/Behavioral: Positive for depression. Negative for agitation, behavioral problems, confusion, decreased concentration, dysphoric mood, hallucinations, self-injury, sleep disturbance and suicidal ideas. The patient is nervous/anxious. The patient is not hyperactive.   Increase in migraine HA.  Medications: I have reviewed the patient's current medications.  Current Outpatient Medications  Medication Sig Dispense Refill  . acetaminophen (TYLENOL) 650 MG CR tablet Take 650 mg by mouth every 8 (eight) hours as needed for pain (or back pain or migraines).     Marland Kitchen aspirin EC 81 MG tablet Take 81 mg by mouth daily.    . Cholecalciferol (VITAMIN D3) 250 MCG (10000 UT) capsule Take 10,000 Units by mouth daily.    . enalapril (VASOTEC) 20 MG tablet Take 1 tablet Daily for BP 90 tablet 0  . Flaxseed, Linseed, (FLAX SEED OIL) 1000 MG CAPS Take 2,000 mg by mouth daily.     Marland Kitchen LORazepam (ATIVAN) 1 MG tablet Take 1 tablet (1 mg total) by mouth at bedtime as needed for sleep. As needed 30 tablet 5  .  Magnesium 500 MG TABS Take 500 mg by mouth daily.     . rosuvastatin (CRESTOR) 40 MG tablet Take 1 tablet Daily for Cholesterol 90 tablet 0  . sertraline (ZOLOFT) 100 MG tablet Take 2 tablets (200 mg total) by mouth daily. 180 tablet 2   No current facility-administered medications for this visit.    Medication Side Effects: None  Allergies:  Allergies  Allergen Reactions  . Asa [Aspirin] Diarrhea, Nausea Only and Other (See Comments)    History of ulcers, also  . Atorvastatin Other (See Comments)    Muscle aches  . Celebrex [Celecoxib] Other (See  Comments)    Headaches   . Codeine Nausea And Vomiting  . Savella [Milnacipran Hcl] Other (See Comments)    Reaction not recalled  . Viagra [Sildenafil Citrate] Other (See Comments)    Headaches  . Penicillins Rash and Other (See Comments)    Past Medical History:  Diagnosis Date  . BPH (benign prostatic hyperplasia)   . Cancer (HCC)    nose, skin cancer  . Elevated hemoglobin A1c   . Fibromyalgia   . GERD (gastroesophageal reflux disease)   . Hyperlipidemia   . Hypertension   . Hypogonadism male   . IBS (irritable bowel syndrome)   . OSA (obstructive sleep apnea)     Family History  Problem Relation Age of Onset  . Cancer Mother        breast  . Cancer Father        lung  . Diabetes Father   . Heart disease Sister   . Arthritis Sister     Social History   Socioeconomic History  . Marital status: Married    Spouse name: Kennyth Lose  . Number of children: Not on file  . Years of education: Not on file  . Highest education level: Not on file  Occupational History  . 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 Smoking status: Former Smoker    Years: 30.00    Types: Cigarettes    Quit date: 10/06/1980    Years since quitting: 39.6  . Smokeless tobacco: Never Used  Vaping Use  . Vaping Use: Never used  Substance and Sexual Activity  . Alcohol use: No    Comment: Not drinking x 1 month  . Drug use: No  . Sexual activity: Not Currently  Other Topics Concern  . Not on file  Social History Narrative  . Not on file   Social Determinants of Health   Financial Resource Strain:   . Difficulty of Paying Living Expenses:   Food Insecurity:   . Worried About Charity fundraiser in the Last Year:   . Arboriculturist in the Last Year:   Transportation Needs:   . Film/video editor (Medical):   Marland Kitchen Lack of Transportation (Non-Medical):   Physical Activity:   . Days of Exercise per Week:   . Minutes of Exercise per Session:   Stress:   . Feeling of Stress :    Social Connections:   . Frequency of Communication with Friends and Family:   . Frequency of Social Gatherings with Friends and Family:   . Attends Religious Services:   . Active Member of Clubs or Organizations:   . Attends Archivist Meetings:   Marland Kitchen Marital Status:   Intimate Partner Violence:   . Fear of Current or Ex-Partner:   . Emotionally Abused:   Marland Kitchen Physically Abused:   . Sexually Abused:  Past Medical History, Surgical history, Social history, and Family history were reviewed and updated as appropriate.   Please see review of systems for further details on the patient's review from today.   Objective:   Physical Exam:  There were no vitals taken for this visit.  Physical Exam Constitutional:      General: He is not in acute distress.    Appearance: He is well-developed.  Musculoskeletal:        General: No deformity.  Neurological:     Mental Status: He is alert and oriented to person, place, and time.     Motor: No tremor.     Coordination: Coordination normal.     Gait: Gait normal.  Psychiatric:        Attention and Perception: He is attentive. He does not perceive auditory hallucinations.        Mood and Affect: Mood is not anxious or depressed. Affect is not labile, blunt, angry or inappropriate.        Speech: Speech normal.        Behavior: Behavior normal.        Thought Content: Thought content normal. Thought content is not paranoid. Thought content does not include homicidal or suicidal ideation. Thought content does not include homicidal or suicidal plan.        Cognition and Memory: Cognition normal.        Judgment: Judgment normal.     Comments: Insight intact. No auditory or visual hallucinations.  Overall less irritable and angry with people. Less blunted, more humor. Talkative.     Lab Review:     Component Value Date/Time   NA 139 04/05/2020 1015   K 4.1 04/05/2020 1015   CL 106 04/05/2020 1015   CO2 30 04/05/2020 1015    GLUCOSE 105 (H) 04/05/2020 1015   BUN 9 04/05/2020 1015   CREATININE 1.41 (H) 04/05/2020 1015   CALCIUM 9.9 04/05/2020 1015   PROT 6.5 04/05/2020 1015   ALBUMIN 3.9 06/22/2019 2128   AST 44 (H) 04/05/2020 1015   ALT 29 04/05/2020 1015   ALKPHOS 93 06/22/2019 2128   BILITOT 0.9 04/05/2020 1015   GFRNONAA 50 (L) 04/05/2020 1015   GFRAA 58 (L) 04/05/2020 1015       Component Value Date/Time   WBC 4.3 04/05/2020 1015   RBC 4.17 (L) 04/05/2020 1015   HGB 12.6 (L) 04/05/2020 1015   HCT 38.5 04/05/2020 1015   PLT 115 (L) 04/05/2020 1015   MCV 92.3 04/05/2020 1015   MCH 30.2 04/05/2020 1015   MCHC 32.7 04/05/2020 1015   RDW 13.5 04/05/2020 1015   LYMPHSABS 1,514 04/05/2020 1015   MONOABS 0.3 06/22/2019 2128   EOSABS 112 04/05/2020 1015   BASOSABS 22 04/05/2020 1015    No results found for: POCLITH, LITHIUM   No results found for: PHENYTOIN, PHENOBARB, VALPROATE, CBMZ   .res Assessment: Plan:    Fread was seen today for follow-up, anxiety and depression.  Diagnoses and all orders for this visit:  Generalized anxiety disorder -     sertraline (ZOLOFT) 100 MG tablet; Take 2 tablets (200 mg total) by mouth daily.  Major depressive disorder, recurrent episode, moderate (HCC) -     sertraline (ZOLOFT) 100 MG tablet; Take 2 tablets (200 mg total) by mouth daily.  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  Insomnia due to mental condition -  LORazepam (ATIVAN) 1 MG tablet; Take 1 tablet (1 mg total) by mouth at bedtime as needed for sleep. As needed  Alcohol dependence, in remission ([place])   Greater than 50% of face to face time with patient was spent on counseling and coordination of care. We discussed his chronic depression and anxiety and irritability, anger.  He's markedly better but has residual symptoms.  Most of mood problem is situational.   Benefit from meds and therapy. No  change in therapy indicated.  Needs to continue the meds bc of a high relapse risk.  Supportive therapy re: dealing with stress of separation. Doesn't look like things will change.  Option potentiate with something but defer.  Not likely to help bc it appears mostly situational.  Disc caffeine and sleep.  Takes it up to 9 pm.  Rec avoid late caffeine.  We discussed the short-term risks associated with benzodiazepines including sedation and increased fall risk among others.  Discussed long-term side effect risk including dependence, potential withdrawal symptoms, and the potential eventual dose-related risk of dementia.  No evidence of abuse.  Disc sobriety maintenance.    No med changes indicated.  This appointment was 15 minutes  FU 6 mos.  Lynder Parents, MD, DFAPA   Please see After Visit Summary for patient specific instructions.  Future Appointments  Date Time Provider [place]  05/22/2020  8:00 AM Shanon Ace, LCSW [facility] None  07/12/2020 10:30 AM [name], MD [facility] None  09/26/2020  9:00 AM Liane Comber, NP [facility] None  11/14/2020  9:30 AM Ward Givens, NP [facility] None  01/09/2021  9:00 AM [name], MD [facility] None  01/21/2021  9:00 AM Cottle, Billey Co., MD [facility] None    No orders of the defined types were placed in this encounter.     -------------------------------

## 2020-05-22 ENCOUNTER — Ambulatory Visit (INDEPENDENT_AMBULATORY_CARE_PROVIDER_SITE_OTHER): Payer: Medicare Other | Admitting: Psychiatry

## 2020-05-22 DIAGNOSIS — F411 Generalized anxiety disorder: Secondary | ICD-10-CM | POA: Diagnosis not present

## 2020-05-22 NOTE — Progress Notes (Signed)
[facility] Counselor/Therapist Progress Note  Patient ID: Cody Hines., MRN: [id number],    Date: 05/22/2020  Time Spent: 60 minutes  8:00am to 9:00am   Treatment Type: Individual Therapy  Reported Symptoms: Patient today reports anxiety, some depression.  Mental Status Exam:  Appearance:   Casual     Behavior:  Appropriate and Sharing  Motor:  Normal  Speech/Language:   Clear and Coherent  Affect:  Depressed and anxious (but some better)  Mood:  anxious and depressed  Thought process:  goal directed  Thought content:    some ruminating  Sensory/Perceptual disturbances:    WNL  Orientation:  oriented to person, place, time/date, situation, day of week, month of year and year  Attention:  Good  Concentration:  Fair  Memory:  some forgetfulness and PCP is aware  Fund of knowledge:   Good  Insight:    Good and Fair  Judgment:   Good and Fair  Impulse Control:  Good and Fair   Risk Assessment: Danger to Self:  No Self-injurious Behavior: No Danger to Others: No Duty to Warn:no Physical Aggression / Violence:No  Access to Firearms a concern: No  Gang Involvement:No   Subjective: Patient today reporting anxiety and depression "but I am some better".  States situation at home is still stressed at times but not as bad overall.  He and wife live as room-mates and frequently disagree and usually "don't argue a lot, but we don't talk much."  Interventions: Solution-Oriented/Positive Psychology and Ego-Supportive  Diagnosis:   ICD-10-CM   1. Generalized anxiety disorder  F41.1      Plan: Patient is not signing tx goals on computer screen due to [place].  Treatment Goals: Goals remain on tx plan as patient works on strategies to achieve his goals. Progress is documented each session in the "Progress" section of Plan.   Long term goal: Develop healthy cognitive patterns and beliefs about self and the world that lead to alleviation and help prevent  relapse of depression.  Short term goal: Identify and replace depressive thinking that leads to depressive feelings and actions.He will replace the depressive thinking with more positive, hopeful thoughts that can lead to more positive and hopeful feelings.  Strategy: Patient to more consistently monitor his thoughts and catch the depressive/negative thoughts, trying to change them to positiveand more reality-based thought patterns. Look at his negative thoughts and weigh them against the evidence.  PROGRESS: Patient in today and states his anxiety and depression are still present but his depression has decreased some.  Shared recent family interaction that was very frustrating for him and notices it's hard for him to manage unexpected stressors or changes in plans.  "Better is some ways, but short on patience at times and has had several traffic bump-ups but feels he is still safe to drive."   Encouraged more caution when driving and in working around the house due to prior issues and his overall health. Some tendency still to look for what might go wrong versus right, but doesn't seem to see that as being connected to his depression.  Talked more about this today and worked on examples in session to help patient understand better.  He is to work more on trying to be less negative and open to looking more at the "positives" between session. Also encouraged to maintain contact with other family and friends who are more positive/supportive people, and focus more on what he can control versus cannot control, and  also living more in the present versus going back to negatives in the past.  Goal review and progress/challenges noted with patient.  Next appt wiithin 4 weeks.   Shanon Ace, LCSW

## 2020-06-27 ENCOUNTER — Ambulatory Visit (INDEPENDENT_AMBULATORY_CARE_PROVIDER_SITE_OTHER): Payer: Medicare Other | Admitting: Psychiatry

## 2020-06-27 ENCOUNTER — Other Ambulatory Visit: Payer: Self-pay

## 2020-06-27 DIAGNOSIS — F411 Generalized anxiety disorder: Secondary | ICD-10-CM

## 2020-06-27 NOTE — Progress Notes (Signed)
[facility] Counselor/Therapist Progress Note  Patient ID: Cody Hines., MRN: [id number],    Date: 06/27/2020  Time Spent: 60 minutes   9:00am to 10:00am  Treatment Type: Individual Therapy  Reported Symptoms: anxiety, depression, worry, difficulties "getting along with wife", feels like "I get mixed messages"  Mental Status Exam:  Appearance:   Casual     Behavior:  Appropriate and Sharing  Motor:  a little more slowed in walking  Speech/Language:   Clear and Coherent  Affect:  anxious, irritable, depressed  Mood:  anxious, depressed and irritable  Thought process:  normal  Thought content:    WNL  Sensory/Perceptual disturbances:    WNL  Orientation:  oriented to person, place, time/date, situation, day of week, month of year and year  Attention:  Good  Concentration:  Fair  Memory:  some forgetfulness and states PCP is aware  Fund of knowledge:   Good  Insight:    Fair  Judgment:   Good and Fair  Impulse Control:  Good   Risk Assessment: Danger to Self:  No Self-injurious Behavior: No Danger to Others: No Duty to Warn:no Physical Aggression / Violence:No  Access to Firearms a concern: No  Gang Involvement:No   Subjective: Patient today reports anxiety, depression, worries a lot, difficulty "getting along with wife and feeling that he gets mixed messages" from her. Very difficult for patient to refrain from going back to negative past.  Interventions: Solution-Oriented/Positive Psychology and Ego-Supportive  Diagnosis:   ICD-10-CM   1. Generalized anxiety disorder  F41.1     Plan: Patient is not signing tx goals on computer screen due to [place].  Treatment Goals: Goals remain on tx plan as patient works on strategies to achieve his goals. Progress is documented each session in the "Progress" section of Plan.   Long term goal: Develop healthy cognitive patterns and beliefs about self and the world that lead to alleviation and help  prevent relapse of depression.  Short term goal: Identify and replace depressive thinking that leads to depressive feelings and actions.He will replace the depressive thinking with more positive, hopeful thoughts that can lead to more positive and hopeful feelings.  Strategy: Patient to more consistently monitor his thoughts and catch the depressive/negative thoughts, trying to change them to positiveand more reality-based thought patterns. Look at his negative thoughts and weigh them against the evidence.  PROGRESS: Patient in today reporting anxiety, depression, irritability, and more difficulty with wife. Reports more irritability recently in communication with her.  He states that wife blames him for all their issues but from what he describes it sounds that they tend to blame each other and make a lot of negative assumptions without ever checking things out with the other person. Vented and processed today a lot of his frustration, worries, resentments from past in his marital relationship. Very hard for patient to focus on any positive changes in his relationship with wife, with whom he shares a house as they live as "room-mates". Tendency to go back to the past and focus only on negatives.  Worked in session and practiced interrupting his negative thoughts, challenging them, and replacing with more reality-based, current, and empowering thoughts that do not support negativity nor dwelling on the past. Encouraged patient to continue this practice daily between sessions.  Goal review and progress/challenges noted with patient.  Next appt within 3-4 weeks.   Shanon Ace, LCSW

## 2020-07-11 ENCOUNTER — Encounter: Payer: Self-pay | Admitting: Internal Medicine

## 2020-07-11 NOTE — Patient Instructions (Signed)

## 2020-07-11 NOTE — Progress Notes (Signed)
History of Present Illness:       This very nice 74 y.o. separated  presents for 3 month follow up with HTN, OSA/CPAP, HLD, Pre-Diabetes and Vitamin D Deficiency.   Patient reports improved restorative sleep with CPAP. Patient is still in counseling for Depression related to marital separation at [facility] by Shanon Ace.      Patient is treated for HTN (1993) & BP has been controlled at home. Today's BP is at goal - 122/62.  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   Patient has had no complaints of any cardiac type chest pain, palpitations, dyspnea / orthopnea / PND, dizziness, claudication, or dependent edema.      Hyperlipidemia is controlled with diet & Rosuvastatin. Patient denies myalgias or other med SE's. Last Lipids were at goal:  Lab Results  Component Value Date   CHOL 111 04/05/2020   HDL 60 04/05/2020   LDLCALC 38 04/05/2020   TRIG 57 04/05/2020   CHOLHDL 1.9 04/05/2020    Also, the patient has history of PreDiabetes  (A1c 6.0% /2011)  d has had no symptoms of reactive hypoglycemia, diabetic polys, paresthesias or visual blurring.  Last A1c was Normal & at goal:  Lab Results  Component Value Date   HGBA1C 5.6 01/03/2020       Further, the patient also has history of Vitamin D Deficiency  ("29" /2008)  and supplements vitamin D without any suspected side-effects. Last vitamin D was not at goal (70-100):   Lab Results  Component Value Date   VD25OH 52 01/03/2020    Current Outpatient Medications on File Prior to Visit  Medication Sig  . Acetaminophen 650 MG CR tablet Take 650 mg  every 8 hours as needed   . aspirin EC 81 MG tablet Take 81 mg by mouth daily.  Marland Kitchen VITAMIN D (10000 UT) cap Take 10,000 Units by mouth daily.  . enalapril  20 MG tablet Take 1 tablet Daily for BP  . Flaxseed 1000 MG CAPS Take 2,000 mgdaily.   Marland Kitchen LORazepam  1 MG tablet Take 1 tablet (1 mg total) by mouth at bedtime   . Magnesium 500 MG TABS Take 500 mg by mouth  daily.   . rosuvastatin 40 MG tablet Take 1 tablet Daily for Cholesterol  . sertraline  100 MG tablet Take 2 tablets (200 mg total) by mouth daily.    Allergies  Allergen Reactions  . Asa [Aspirin] Diarrhea, Nausea Only and History of ulcers,   . Atorvastatin Muscle aches  . Celebrex [Celecoxib] Headaches  . Codeine Nausea And Vomiting  . Savella  Reaction not recalled  . Viagra [Sildenafil Citrate] Headaches  . Penicillins Rash     PMHx:   Past Medical History:  Diagnosis Date  . BPH (benign prostatic hyperplasia)   . Cancer (HCC)    nose, skin cancer  . Elevated hemoglobin A1c   . Fibromyalgia   . GERD (gastroesophageal reflux disease)   . Hyperlipidemia   . Hypertension   . Hypogonadism male   . IBS (irritable bowel syndrome)   . OSA (obstructive sleep apnea)     Immunization History  Administered Date(s) Administered  . PFIZER SARS-COV-2 Vaccination 11/19/2019, 12/11/2019  . Pneumococcal Conjugate-13 09/17/2018  . Pneumococcal-Unspecified 07/16/2011  . Tdap 01/13/2010  . Zoster 08/09/2013    Past Surgical History:  Procedure Laterality Date  .  nasal smr np3  1985  . CATARACT EXTRACTION, BILATERAL Bilateral 2021  Dr. Katy Fitch, L 5/27, R 3/25  . KNEE ARTHROSCOPY Left 1999  . SKIN CANCER EXCISION  2020   nose  . SPINE SURGERY  2007   L5 S 1 Disk  . SPINE SURGERY  09/2019   Compression fracture stabilization by Dr. Arnoldo Morale  . VASECTOMY  1983    FHx:    Reviewed / unchanged  SHx:    Reviewed / unchanged   Systems Review:  Constitutional: Denies fever, chills, wt changes, headaches, insomnia, fatigue, night sweats, change in appetite. Eyes: Denies redness, blurred vision, diplopia, discharge, itchy, watery eyes.  ENT: Denies discharge, congestion, post nasal drip, epistaxis, sore throat, earache, hearing loss, dental pain, tinnitus, vertigo, sinus pain, snoring.  CV: Denies chest pain, palpitations, irregular heartbeat, syncope, dyspnea, diaphoresis,  orthopnea, PND, claudication or edema. Respiratory: denies cough, dyspnea, DOE, pleurisy, hoarseness, laryngitis, wheezing.  Gastrointestinal: Denies dysphagia, odynophagia, heartburn, reflux, water brash, abdominal pain or cramps, nausea, vomiting, bloating, diarrhea, constipation, hematemesis, melena, hematochezia  or hemorrhoids. Genitourinary: Denies dysuria, frequency, urgency, nocturia, hesitancy, discharge, hematuria or flank pain. Musculoskeletal: Denies arthralgias, myalgias, stiffness, jt. swelling, pain, limping or strain/sprain.  Skin: Denies pruritus, rash, hives, warts, acne, eczema or change in skin lesion(s). Neuro: No weakness, tremor, incoordination, spasms, paresthesia or pain. Psychiatric: Denies confusion, memory loss or sensory loss. Endo: Denies change in weight, skin or hair change.  Heme/Lymph: No excessive bleeding, bruising or enlarged lymph nodes.  Physical Exam  BP 122/62   Pulse 63   Temp (!) 97.2 F (36.2 C)   Resp 16   Ht 6' (1.829 m)   Wt 199 lb 12.8 oz (90.6 kg)   BMI 27.10 kg/m   Appears  Over nourished, well groomed  and in no distress.  Eyes: PERRLA, EOMs, conjunctiva no swelling or erythema. Sinuses: No frontal/maxillary tenderness ENT/Mouth: EAC's clear, TM's nl w/o erythema, bulging. Nares clear w/o erythema, swelling, exudates. Oropharynx clear without erythema or exudates. Oral hygiene is good. Tongue normal, non obstructing. Hearing intact.  Neck: Supple. Thyroid not palpable. Car 2+/2+ without bruits, nodes or JVD. Chest: Respirations nl with BS clear & equal w/o rales, rhonchi, wheezing or stridor.  Cor: Heart sounds normal w/ regular rate and rhythm without sig. murmurs, gallops, clicks or rubs. Peripheral pulses normal and equal  without edema.  Abdomen: Soft & bowel sounds normal. Non-tender w/o guarding, rebound, hernias, masses or organomegaly.  Lymphatics: Unremarkable.  Musculoskeletal: Full ROM all peripheral extremities, joint  stability, 5/5 strength and normal gait.  Skin: Warm, dry without exposed rashes, lesions or ecchymosis apparent.  Neuro: Cranial nerves intact, reflexes equal bilaterally. Sensory-motor testing grossly intact. Tendon reflexes grossly intact.  Pysch: Alert & oriented x 3.  Insight and judgement nl & appropriate. No ideations.  Assessment and Plan:  1. Essential hypertension  - Continue medication, monitor blood pressure at home.  - Continue DASH diet.  Reminder to go to the ER if any CP,  SOB, nausea, dizziness, severe HA, changes vision/speech.  - CBC with Differential/Platelet - COMPLETE METABOLIC PANEL WITH GFR - Magnesium - TSH  2. Hyperlipidemia, mixed  - Continue diet/meds, exercise,& lifestyle modifications.  - Continue monitor periodic cholesterol/liver & renal functions   - Lipid panel - TSH  3. Glucose intolerance  - Continue diet, exercise  - Lifestyle modifications.  - Monitor appropriate labs.  - Hemoglobin A1c - Insulin, random  4. Vitamin D deficiency  - Continue supplementation.  - VITAMIN D 25 Hydroxy   5. Stage 3a chronic kidney disease (  [place])  - COMPLETE METABOLIC PANEL WITH GFR  6. OSA on CPAP   7. Medication management  - CBC with Differential/Platelet - COMPLETE METABOLIC PANEL WITH GFR - Magnesium - Lipid panel - TSH - Hemoglobin A1c - Insulin, random - VITAMIN D 25 Hydroxyl       Discussed  regular exercise, BP monitoring, weight control to achieve/maintain BMI less than 25 and discussed med and SE's. Recommended labs to assess and monitor clinical status with further disposition pending results of labs.  I discussed the assessment and treatment plan with the patient. The patient was provided an opportunity to ask questions and all were answered. The patient agreed with the plan and demonstrated an understanding of the instructions.  I provided over 30 minutes of exam, counseling, chart review and  complex critical decision  making.   Kirtland Bouchard, MD

## 2020-07-12 ENCOUNTER — Ambulatory Visit (INDEPENDENT_AMBULATORY_CARE_PROVIDER_SITE_OTHER): Payer: Medicare Other | Admitting: Internal Medicine

## 2020-07-12 ENCOUNTER — Other Ambulatory Visit: Payer: Self-pay

## 2020-07-12 VITALS — BP 122/62 | HR 63 | Temp 97.2°F | Resp 16 | Ht 72.0 in | Wt 199.8 lb

## 2020-07-12 DIAGNOSIS — N1831 Chronic kidney disease, stage 3a: Secondary | ICD-10-CM | POA: Diagnosis not present

## 2020-07-12 DIAGNOSIS — G4733 Obstructive sleep apnea (adult) (pediatric): Secondary | ICD-10-CM | POA: Diagnosis not present

## 2020-07-12 DIAGNOSIS — Z79899 Other long term (current) drug therapy: Secondary | ICD-10-CM

## 2020-07-12 DIAGNOSIS — E782 Mixed hyperlipidemia: Secondary | ICD-10-CM

## 2020-07-12 DIAGNOSIS — Z9989 Dependence on other enabling machines and devices: Secondary | ICD-10-CM

## 2020-07-12 DIAGNOSIS — E7439 Other disorders of intestinal carbohydrate absorption: Secondary | ICD-10-CM | POA: Diagnosis not present

## 2020-07-12 DIAGNOSIS — E559 Vitamin D deficiency, unspecified: Secondary | ICD-10-CM | POA: Diagnosis not present

## 2020-07-12 DIAGNOSIS — I10 Essential (primary) hypertension: Secondary | ICD-10-CM | POA: Diagnosis not present

## 2020-07-13 LAB — CBC WITH DIFFERENTIAL/PLATELET
Absolute Monocytes: 297 cells/uL (ref 200–950)
Basophils Absolute: 22 cells/uL (ref 0–200)
Basophils Relative: 0.5 %
Eosinophils Absolute: 129 cells/uL (ref 15–500)
Eosinophils Relative: 3 %
HCT: 39.6 % (ref 38.5–50.0)
Hemoglobin: 13.2 g/dL (ref 13.2–17.1)
Lymphs Abs: 1415 cells/uL (ref 850–3900)
MCH: 30.5 pg (ref 27.0–33.0)
MCHC: 33.3 g/dL (ref 32.0–36.0)
MCV: 91.5 fL (ref 80.0–100.0)
MPV: 10.6 fL (ref 7.5–12.5)
Monocytes Relative: 6.9 %
Neutro Abs: 2438 cells/uL (ref 1500–7800)
Neutrophils Relative %: 56.7 %
Platelets: 133 10*3/uL — ABNORMAL LOW (ref 140–400)
RBC: 4.33 10*6/uL (ref 4.20–5.80)
RDW: 13.4 % (ref 11.0–15.0)
Total Lymphocyte: 32.9 %
WBC: 4.3 10*3/uL (ref 3.8–10.8)

## 2020-07-13 LAB — TSH: TSH: 1.89 mIU/L (ref 0.40–4.50)

## 2020-07-13 LAB — LIPID PANEL
Cholesterol: 126 mg/dL (ref <200)
HDL: 53 mg/dL (ref >=40)
LDL Cholesterol (Calc): 57 mg/dL (calc)
Non-HDL Cholesterol (Calc): 73 mg/dL (calc) (ref <130)
Total CHOL/HDL Ratio: 2.4 (calc) (ref <5.0)
Triglycerides: 76 mg/dL (ref <150)

## 2020-07-13 LAB — COMPLETE METABOLIC PANEL WITH GFR
AG Ratio: 1.8 (calc) (ref 1.0–2.5)
ALT: 17 U/L (ref 9–46)
AST: 30 U/L (ref 10–35)
Albumin: 4 g/dL (ref 3.6–5.1)
Alkaline phosphatase (APISO): 96 U/L (ref 35–144)
BUN/Creatinine Ratio: 8 (calc) (ref 6–22)
BUN: 11 mg/dL (ref 7–25)
CO2: 29 mmol/L (ref 20–32)
Calcium: 9.7 mg/dL (ref 8.6–10.3)
Chloride: 102 mmol/L (ref 98–110)
Creat: 1.46 mg/dL — ABNORMAL HIGH (ref 0.70–1.18)
GFR, Est African American: 55 mL/min/{1.73_m2} — ABNORMAL LOW (ref >=60)
GFR, Est Non African American: 48 mL/min/{1.73_m2} — ABNORMAL LOW (ref >=60)
Globulin: 2.2 g/dL (calc) (ref 1.9–3.7)
Glucose, Bld: 208 mg/dL — ABNORMAL HIGH (ref 65–99)
Potassium: 3.6 mmol/L (ref 3.5–5.3)
Sodium: 138 mmol/L (ref 135–146)
Total Bilirubin: 1 mg/dL (ref 0.2–1.2)
Total Protein: 6.2 g/dL (ref 6.1–8.1)

## 2020-07-13 LAB — HEMOGLOBIN A1C
Hgb A1c MFr Bld: 5.6 % of total Hgb (ref <5.7)
Mean Plasma Glucose: 114 (calc)
eAG (mmol/L): 6.3 (calc)

## 2020-07-13 LAB — MAGNESIUM: Magnesium: 2 mg/dL (ref 1.5–2.5)

## 2020-07-13 LAB — INSULIN, RANDOM: Insulin: 84.6 u[IU]/mL — ABNORMAL HIGH

## 2020-07-13 LAB — VITAMIN D 25 HYDROXY (VIT D DEFICIENCY, FRACTURES): Vit D, 25-Hydroxy: 61 ng/mL (ref 30–100)

## 2020-07-14 NOTE — Progress Notes (Signed)
========================================================== -   Test results slightly outside the reference range are not unusual. If there is anything important, I will review this with you,  otherwise it is considered normal test values.  If you have further questions,  please do not hesitate to contact me at the office or via My Chart.  ==========================================================  -  Kidney Functions (Creatinine & GFR ) appear slightly  worse or Dehydrated, Chronic Kidney Disease - Stage 3b  - So . . . . . It's very important to drink more Fluids   - Recommend drink a least 6 bottles (16 oz) of water or Fluids /day  To prevent permanent or irreversible Kidney damage ==========================================================  -  Total Chol = 126 -  Excellent   - Very low risk for Heart Attack  / Stroke ==========================================================  - A1c = 5.6% - Excellent - Back in Normal non-Diabetic range -Great ! ==========================================================  -  But . . . . . .   Insulin level = 84.6 is very elevated (Normal is less than 20)   - & shows insulin resistance - a sign of early diabetes and associated  with a 300 % greater risk for heart attacks, strokes, cancer &  Alzheimer type vascular dementia   - All this can be cured  and prevented with losing weight   - get Dr Fara Olden Fuhrman's book 'the End of Diabetes" and "the End of Dieting"   - and add many years of good health to your life. ==========================================================  -  Vitamin D = 61 - Excellent ! ==========================================================  -  All Else - CBC - Kidneys - Electrolytes - Liver - Magnesium & Thyroid    - all  Normal / OK ==========================================================

## 2020-07-18 ENCOUNTER — Other Ambulatory Visit: Payer: Self-pay | Admitting: Internal Medicine

## 2020-07-25 ENCOUNTER — Other Ambulatory Visit: Payer: Self-pay

## 2020-07-25 ENCOUNTER — Ambulatory Visit (INDEPENDENT_AMBULATORY_CARE_PROVIDER_SITE_OTHER): Payer: Medicare Other | Admitting: Psychiatry

## 2020-07-25 DIAGNOSIS — F411 Generalized anxiety disorder: Secondary | ICD-10-CM

## 2020-07-25 NOTE — Progress Notes (Signed)
      [facility] Counselor/Therapist Progress Note  Patient ID: Cody Philmore Johne Buckle., MRN: [id number],    Date: 07/25/2020  Time Spent: 60 minutes   9:00am to 10:00am  Treatment Type: Individual Therapy  Reported Symptoms: anxiety, depression, reports he fell again recently in his outside shop but not hurt badly.  States his PCP has told him he needs to be more careful moving about.  Mental Status Exam:  Appearance:   Casual     Behavior:  Appropriate and Sharing  Motor:  a bit slowed and needing to be more careful with his balance  Speech/Language:   Clear and Coherent  Affect:  anxious  Mood:  anxious and depressed  Thought process:  normal  Thought content:    WNL  Sensory/Perceptual disturbances:    WNL  Orientation:  oriented to person, place, time/date, situation, day of week, month of year and year  Attention:  Good  Concentration:  Fair  Memory:  some forgetfulness; states PCP is aware  Fund of knowledge:   Good  Insight:    Fair  Judgment:   Good and Fair  Impulse Control:  Good   Risk Assessment: Danger to Self:  No Self-injurious Behavior: No Danger to Others: No Duty to Warn:no Physical Aggression / Violence:No  Access to Firearms a concern: No  Gang Involvement:No   Subjective: Patient today reports anxiety and depression "but anxiety is the stronger one."   Interventions: Solution-Oriented/Positive Psychology and Ego-Supportive  Diagnosis:   ICD-10-CM   1. Generalized anxiety disorder  F41.1     Plan: Patient is not signing tx goals on computer screen due to [place].  Treatment Goals: Goals remain on tx plan as patient works on strategies to achieve his goals. Progress is documented each session in the "Progress" section of Plan.   Long term goal: Develop healthy cognitive patterns and beliefs about self and the world that lead to alleviation and help prevent relapse of depression.  Short term goal: Identify and replace depressive  thinking that leads to depressive feelings and actions.He will replace the depressive thinking with more positive, hopeful thoughts that can lead to more positive and hopeful feelings.  Strategy: Patient to more consistently monitor his thoughts and catch the depressive/negative thoughts, trying to change them to positiveand more reality-based thought patterns. Look at his negative thoughts and weigh them against the evidence.  PROGRESS: Patient in today reporting anxiety and depression. Had some balance issues "off and on"; PCP is aware and has advised patient to be more careful as he moves around. Ongoing issues with his wife in their communication and living in the same home.  States they do very little together, sometimes get along, and other times it's challenge to get along with each other.  Anxious/negative/depressive thoughts continue and we work on this is session today per short term goal and strategy in tx plan above. Discussed some re-framing options and interrupting the problematic thoughts, challenging them and replacing with more positive/reality-based/hopeful thoughts that don't support anxiety, negativity, nor depression. Especially challenged his negative thinking, and he feels that it has decreased some.    Goal review and progress/challenges noted with patient.  Next appt within 3-4 weeks.   Shanon Ace, LCSW

## 2020-08-03 DIAGNOSIS — H16223 Keratoconjunctivitis sicca, not specified as Sjogren's, bilateral: Secondary | ICD-10-CM | POA: Diagnosis not present

## 2020-08-03 DIAGNOSIS — H17822 Peripheral opacity of cornea, left eye: Secondary | ICD-10-CM | POA: Diagnosis not present

## 2020-08-03 DIAGNOSIS — Z961 Presence of intraocular lens: Secondary | ICD-10-CM | POA: Diagnosis not present

## 2020-08-03 DIAGNOSIS — H04123 Dry eye syndrome of bilateral lacrimal glands: Secondary | ICD-10-CM | POA: Diagnosis not present

## 2020-08-03 DIAGNOSIS — H16213 Exposure keratoconjunctivitis, bilateral: Secondary | ICD-10-CM | POA: Diagnosis not present

## 2020-08-07 ENCOUNTER — Other Ambulatory Visit: Payer: Self-pay | Admitting: Internal Medicine

## 2020-08-07 DIAGNOSIS — E782 Mixed hyperlipidemia: Secondary | ICD-10-CM

## 2020-08-15 ENCOUNTER — Ambulatory Visit (INDEPENDENT_AMBULATORY_CARE_PROVIDER_SITE_OTHER): Payer: Medicare Other | Admitting: Psychiatry

## 2020-08-15 ENCOUNTER — Other Ambulatory Visit: Payer: Self-pay

## 2020-08-15 DIAGNOSIS — F411 Generalized anxiety disorder: Secondary | ICD-10-CM

## 2020-08-15 NOTE — Progress Notes (Signed)
[facility] Counselor/Therapist Progress Note  Patient ID: Cody Philmore Amara Manalang., MRN: [id number],    Date: 08/15/2020   Time Spent: 50 minutes   8:05am to 8:55am  Treatment Type: Individual Therapy  Reported Symptoms: anxiety, depression some better, aggravation  Mental Status Exam:  Appearance:   Casual     Behavior:  Appropriate and Sharing  Motor:  some slowed walking   Speech/Language:   Clear and Coherent  Affect:  anxious, depressed  Mood:  anxious and depressed  Thought process:  tangential (mild)  Thought content:    some rumination  Sensory/Perceptual disturbances:    WNL  Orientation:  oriented to person, place, time/date, situation, day of week, month of year and year  Attention:  Fair  Concentration:  Fair  Memory:  some forgetting and "worse with stress"  Fund of knowledge:   Good  Insight:    Fair  Judgment:   Good and Fair  Impulse Control:  Good   Risk Assessment: Danger to Self:  No Self-injurious Behavior: No Danger to Others: No Duty to Warn:no Physical Aggression / Violence:No  Access to Firearms a concern: No  Gang Involvement:No   Subjective: Patient reports anxiety, some depression, aggravation.  "Hard to be positive".  Ongoing conflictual relationship with wife after moving back in with her.  Living as house-mates.   Interventions: Solution-Oriented/Positive Psychology and Ego-Supportive  Diagnosis:   ICD-10-CM   1. Generalized anxiety disorder  F41.1      Plan: Patient is not signing tx goals on computer screen due to [place].  Treatment Goals: Goals remain on tx plan as patient works on strategies to achieve his goals. Progress is documented each session in the "Progress" section of Plan.   Long term goal: Develop healthy cognitive patterns and beliefs about self and the world that lead to alleviation and help prevent relapse of depression.  Short term goal: Identify and replace depressive thinking that leads to  depressive feelings and actions.He will replace the depressive thinking with more positive, hopeful thoughts that can lead to more positive and hopeful feelings.  Strategy: Patient to more consistently monitor his thoughts and catch the depressive/negative thoughts, trying to change them to positiveand more reality-based thought patterns. Look at his negative thoughts and weigh them against the evidence.  PROGRESS: Patient in today reporting anxiety as his main symptom, some depression, and aggravation. States most of his anxiety is related to he and wife (formerly separated and now living together.) Situation with wife he describes as living as Sport and exercise psychologist and frequent conflict still happening and he feels it won't likely get much better. "Some times are better than others" and feels the conflicts are mostly wife's fault. Plans to continue staying in the home. Difficult for him to focus on any positives and difficult to help motivate patient in a more positive direction. Looked at his negativity which he doesn't tend to recognize, and ends up in blaming others primarily wife. Does relate to having some anxious thoughts and we reviewed his short-term treatment goal of interrupting and challenging those thoughts in order to replace with more reality based, encouraging, and empowering thoughts. Challenged his negative thinking some more this session but patient sees it more as "not negative thinking" but rather "justified" referring to his situation and living with his wife, which is his choice.  Encouraged patient to take more breaks from home and visit some of his friends, and he does visit family in [place] often.  Encouraged to also  get out side some each day, and stay in touch with supportive people including extended family and friends, and challenged him to look for some positives in each day.  Goal review and progress/challenges noted with patient.   Next appt within 4 weeks.   Shanon Ace,  LCSW

## 2020-09-11 ENCOUNTER — Ambulatory Visit: Payer: Medicare Other | Admitting: Psychiatry

## 2020-09-25 NOTE — Progress Notes (Signed)
MEDICARE ANNUAL WELLNESS VISIT AND FOLLOW UP Assessment:   Encounter for Medicare annual wellness exam  Atherosclerosis of aorta Control blood pressure, cholesterol, glucose, increase exercise.   Essential hypertension - continue medications, DASH diet, exercise and monitor at home. Call if greater than 130/80.  -     CBC with Differential/Platelet -     CMP/GFR -     TSH  Hyperlipidemia -continue medications, check lipids, decrease fatty foods, increase activity.  -     Lipid panel  Prediabetes Discussed general issues about diabetes pathophysiology and management., Educational material distributed., Suggested low cholesterol diet., Encouraged aerobic exercise., Discussed foot care., Reminded to get yearly retinal exam. -     Hemoglobin A1c  Depression, major, in active ([place]) Continue follow up with psych/counseling, doing much better Continue medications  Lifestyle discussed: diet/exerise, sleep hygiene, stress management, hydration  OSA on CPAP Weight loss advised, continue CPAP  Gastroesophageal reflux disease, esophagitis presence not specified Continue PPI/H2 blocker, diet discussed  IBS (irritable bowel syndrome) Diet controlled  Fibromyalgia Increase exercise, healthy eating  BPH (benign prostatic hyperplasia) Continue to monitor  Vitamin D deficiency Continue supplement  Medication management -     Magnesium  CKD 3  Increase fluids, avoid NSAIDS, monitor sugars, will monitor  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 Arnoldo Morale 09/2019  Reports pain is fairly managed by tylenol, discussed alternative OTC topicals, voltaren Declines referral for further management at this time   Moderate fall risk  R/t recent injury with fall; has completed PT, declines referral back  Falls prevention reviewed;  information provided; consider repeat PT next year  Personal history of skin cancer Continue follow up with dermatology    Defer labs as last < 90 days   Over 30 minutes of exam, counseling, chart review, and critical decision making was performed  Future Appointments  Date Time Provider [place]  10/09/2020  8:00 AM Shanon Ace, LCSW CP-CP None  11/07/2020  8:00 AM Shanon Ace, LCSW CP-CP None  11/14/2020  9:30 AM Ward Givens, NP GNA-GNA None  01/09/2021  9:00 AM [name], MD GAAM-GAAIM None  01/21/2021  9:00 AM Cottle, Billey Co., MD CP-CP None  09/26/2021  9:00 AM Liane Comber, NP GAAM-GAAIM None     Plan:   During the course of the visit the patient was educated and counseled about appropriate screening and preventive services including:    Pneumococcal vaccine   Influenza vaccine  Prevnar 13  Td vaccine  Screening electrocardiogram  Colorectal cancer screening  Diabetes screening  Glaucoma screening  Nutrition counseling    Subjective:  Cody Hines. is a 74 y.o. male who presents for Medicare Annual Wellness Visit and 3 month follow up for HTN, hyperlipidemia, prediabetes, and vitamin D Def.   He has been separated from wife for nearly 2 years until fall 2020, fell off of a ladder and had T12 compression fracture, underwent surgical correction by Dr. Arnoldo Morale early December 2020, moved back in with wife to avoid SNF. He reports improved for a few weeks, but recurrent pain, taking tylenol arthritis. Declines further interventions at this time.   CT 09/2019 showed small R kidney, presumed RAS, disccussed but patient declined imaging/workup due to cost, BPs remain stable   He is seeing [facility] Charlott Hol[facility] and a therapist this as well, he is  on zoloft, and his depression is somewhat better, now doing therapy once a month with Hassan Rowan. Daughter is psychologist. He was on lorazepam PRN, somewhat increased since back  fracture.   On CPAP for OSA , reports 100% compliance and that this is helping.   He has personal history of skin cancer to nose and follows with dermatology.   BMI is Body mass index is 27.4 kg/m., he has not been working on diet and exercise. Wt Readings from Last 3 Encounters:  09/26/20 202 lb (91.6 kg)  07/12/20 199 lb 12.8 oz (90.6 kg)  05/14/20 197 lb (89.4 kg)   His blood pressure has been controlled at home, today their BP is BP: (!) 122/58  He does not workout. He denies chest pain, shortness of breath, dizziness.  Aortic atherosclerosis per CT 09/2019.    He is on cholesterol medication and denies myalgias. His cholesterol is at goal. The cholesterol last visit was:   Lab Results  Component Value Date   CHOL 126 07/12/2020   HDL 53 07/12/2020   LDLCALC 57 07/12/2020   TRIG 76 07/12/2020   CHOLHDL 2.4 07/12/2020    He has been working on diet and exercise for prediabetes, and denies paresthesia of the feet, polydipsia, polyuria and visual disturbances. Last A1C in the office was:  Lab Results  Component Value Date   HGBA1C 5.6 07/12/2020   He estimates 80 fluid ounces of water intake, CKD III range, GFR 40 range predating 2017 on review. Last 3 GFR:  Lab Results  Component Value Date   GFRNONAA 48 (L) 07/12/2020   GFRNONAA 50 (L) 04/05/2020   GFRNONAA 54 (L) 01/03/2020   Patient is on Vitamin D supplement.   Lab Results  Component Value Date   VD25OH 61 07/12/2020       Medication Review: Current Outpatient Medications on File Prior to Visit  Medication Sig Dispense Refill   acetaminophen (TYLENOL) 650 MG CR tablet Take 650 mg by mouth every 8 (eight) hours as needed for pain (or back pain or migraines).      aspirin EC 81 MG tablet Take 81 mg by mouth daily.     Cholecalciferol (VITAMIN D3) 250 MCG (10000 UT) capsule Take 10,000 Units by mouth daily.     enalapril (VASOTEC) 20 MG tablet Take     1 tablet      Daily      for BP 90 tablet 0    Flaxseed, Linseed, (FLAX SEED OIL) 1000 MG CAPS Take 2,000 mg by mouth daily.      LORazepam (ATIVAN) 1 MG tablet Take 1 tablet (1 mg total) by mouth at bedtime as needed for sleep. As needed 30 tablet 5   Magnesium 500 MG TABS Take 500 mg by mouth daily.      rosuvastatin (CRESTOR) 40 MG tablet Take     1 tablet      Daily       for Cholesterol 90 tablet 0   sertraline (ZOLOFT) 100 MG tablet Take 2 tablets (200 mg total) by mouth daily. 180 tablet 2   No current facility-administered medications on file prior to visit.    Allergies: Allergies  Allergen Reactions   Asa [Aspirin] Diarrhea, Nausea Only and Other (See Comments)    History of ulcers, also   Atorvastatin Other (See Comments)    Muscle aches   Celebrex [Celecoxib] Other (See Comments)    Headaches    Codeine Nausea And Vomiting   Savella [  Milnacipran Hcl] Other (See Comments)    Reaction not recalled   Viagra [Sildenafil Citrate] Other (See Comments)    Headaches   Penicillins Rash and Other (See Comments)    Current Problems (verified) has Essential hypertension; GERD (gastroesophageal reflux disease); Other abnormal glucose (prediabetes); IBS (irritable bowel syndrome); OSA on CPAP; Fibromyalgia; BPH (benign prostatic hyperplasia); Testosterone deficiency; Medication management; Vitamin D deficiency; Encounter for Medicare annual wellness exam; Overweight (BMI 25.0-29.9); Benign essential tremor; Chronic lumbar pain; Major depressive disorder, recurrent episode, moderate (HCC); CKD (chronic kidney disease) stage 3, GFR 30-59 ml/min ([place]); Hyperlipidemia, mixed; Personal history of skin cancer; At moderate risk for fall; Compression fracture of T12 vertebra ([place]); Thoracic aorta atherosclerosis ([place]); and Small right kidney on their problem list.  Screening Tests Immunization History  Administered Date(s) Administered   PFIZER SARS-COV-2 Vaccination 11/19/2019, 12/11/2019   Pneumococcal Conjugate-13  09/17/2018   Pneumococcal-Unspecified 07/16/2011   Tdap 01/13/2010   Zoster 08/09/2013   Preventative care: Last colonoscopy: 07/2013 due 2024 CXR 2009 CT AB 06/2019 - aortic atherosclerosis, small R kidney Sleep study 06/2017  Prior vaccinations: TD or Tdap: 2011 defer due to cost, get PRN Influenza: declines  Pneumococcal: 2012, TODAY  Prevnar13: 2019 Shingles/Zostavax: 2014 Covid 19: 2/3, 2021, pfizer  Names of Other Physician/Practitioners you currently use: 1. [facility] here for primary care 2. Dr Katy Fitch, eye doctor, last visit  2020 3. Dr Radford Pax, [place] , dentist, last visit 2019   Patient Care Team: [name], MD as PCP - General (Internal Medicine) Druscilla Brownie, MD as Consulting Physician (Dermatology) Teena Irani, MD (Inactive) as Consulting Physician (Gastroenterology) Newman Pies, MD as Consulting Physician (Neurosurgery) Clent Jacks, MD as Consulting Physician (Ophthalmology)  Surgical: He  has a past surgical history that includes Spine surgery (2007); Knee arthroscopy (Left, 1999);  nasal smr np3 (1985); Vasectomy (1983); Skin cancer excision (2020); Spine surgery (09/2019); and Cataract extraction, bilateral (Bilateral, 2021). Family His family history includes Arthritis in his sister; Cancer in his father and mother; Diabetes in his father; Heart disease in his sister. Social history  He reports that he quit smoking about 40 years ago. His smoking use included cigarettes. He quit after 30.00 years of use. He has never used smokeless tobacco. He reports that he does not drink alcohol and does not use drugs.  MEDICARE WELLNESS OBJECTIVES: Physical activity: Current Exercise Habits: The patient does not participate in regular exercise at present, Exercise limited by: orthopedic condition(s) Cardiac risk factors: Cardiac Risk Factors include: advanced age (>50men, >33 women);male  gender;dyslipidemia;sedentary lifestyle;hypertension;smoking/ tobacco exposure Depression/mood screen:   Depression screen [facility] 2/9 09/26/2020  Decreased Interest 1  Down, Depressed, Hopeless 2  PHQ - 2 Score 3  Altered sleeping 1  Tired, decreased energy 2  Change in appetite 0  Feeling bad or failure about yourself  1  Trouble concentrating 1  Moving slowly or fidgety/restless 0  Suicidal thoughts 0  PHQ-9 Score 8  Difficult doing work/chores Somewhat difficult    ADLs:  In your present state of health, do you have any difficulty performing the following activities: 09/26/2020 07/11/2020  Hearing? Y N  Comment declines hearing aids, mild -  Vision? N N  Difficulty concentrating or making decisions? N N  Walking or climbing stairs? N N  Dressing or bathing? N N  Doing errands, shopping? N N  Some recent data might be hidden     Cognitive Testing  Alert? Yes  Normal Appearance?Yes  Oriented to person?  Yes  Place? Yes   Time? Yes  Recall of three objects?  Yes  Can perform simple calculations? Yes  Displays appropriate judgment?Yes  Can read the correct time from a watch face?Yes  EOL planning: Does Patient Have a Medical Advance Directive?: Yes Type of Advance Directive: [place] will Does patient want to make changes to medical advance directive?: No - Patient declined Copy of [place] in Chart?: No - copy requested   Objective:   Today's Vitals   09/26/20 0857  BP: (!) 122/58  Pulse: 66  Temp: 97.9 F (36.6 C)  SpO2: 99%  Weight: 202 lb (91.6 kg)   Body mass index is 27.4 kg/m.  General appearance: alert, no distress, WD/WN, male HEENT: normocephalic, sclerae anicteric, TMs pearly, nares patent, no discharge or erythema, pharynx normal Oral cavity: MMM, no lesions Neck: supple, no lymphadenopathy, no thyromegaly, no masses Heart: RRR, normal S1, S2, no murmurs Lungs: CTA bilaterally, no wheezes, rhonchi, or  rales Abdomen: +bs, soft, non tender, non distended, no masses, no hepatomegaly, no splenomegaly Musculoskeletal: nontender, no swelling, no obvious deformity Extremities: no edema, no cyanosis, no clubbing Pulses: 2+ symmetric, upper and lower extremities, normal cap refill Neurological: alert, oriented x 3, CN2-12 intact, strength normal upper extremities and lower extremities, sensation normal throughout, DTRs 2+ throughout, no cerebellar signs, gait normal. Has chronic resting tremor of LUE.  Psychiatric: depressed affect, behavior normal, pleasant   Medicare Attestation I have personally reviewed: The patient's medical and social history Their use of alcohol, tobacco or illicit drugs Their current medications and supplements The patient's functional ability including ADLs,fall risks, home safety risks, cognitive, and hearing and visual impairment Diet and physical activities Evidence for depression or mood disorders  The patient's weight, height, BMI, and visual acuity have been recorded in the chart.  I have made referrals, counseling, and provided education to the patient based on review of the above and I have provided the patient with a written personalized care plan for preventive services.     Cody Ribas, NP   09/26/2020

## 2020-09-26 ENCOUNTER — Encounter: Payer: Self-pay | Admitting: Adult Health

## 2020-09-26 ENCOUNTER — Other Ambulatory Visit: Payer: Self-pay

## 2020-09-26 ENCOUNTER — Ambulatory Visit (INDEPENDENT_AMBULATORY_CARE_PROVIDER_SITE_OTHER): Payer: Medicare Other | Admitting: Adult Health

## 2020-09-26 VITALS — BP 122/58 | HR 66 | Temp 97.9°F | Wt 202.0 lb

## 2020-09-26 DIAGNOSIS — Z0001 Encounter for general adult medical examination with abnormal findings: Secondary | ICD-10-CM | POA: Diagnosis not present

## 2020-09-26 DIAGNOSIS — G8929 Other chronic pain: Secondary | ICD-10-CM

## 2020-09-26 DIAGNOSIS — E782 Mixed hyperlipidemia: Secondary | ICD-10-CM

## 2020-09-26 DIAGNOSIS — R6889 Other general symptoms and signs: Secondary | ICD-10-CM | POA: Diagnosis not present

## 2020-09-26 DIAGNOSIS — F331 Major depressive disorder, recurrent, moderate: Secondary | ICD-10-CM

## 2020-09-26 DIAGNOSIS — K219 Gastro-esophageal reflux disease without esophagitis: Secondary | ICD-10-CM | POA: Diagnosis not present

## 2020-09-26 DIAGNOSIS — Z23 Encounter for immunization: Secondary | ICD-10-CM | POA: Diagnosis not present

## 2020-09-26 DIAGNOSIS — M797 Fibromyalgia: Secondary | ICD-10-CM

## 2020-09-26 DIAGNOSIS — Z0000 Encounter for general adult medical examination without abnormal findings: Secondary | ICD-10-CM

## 2020-09-26 DIAGNOSIS — G4733 Obstructive sleep apnea (adult) (pediatric): Secondary | ICD-10-CM

## 2020-09-26 DIAGNOSIS — Z85828 Personal history of other malignant neoplasm of skin: Secondary | ICD-10-CM

## 2020-09-26 DIAGNOSIS — Z9989 Dependence on other enabling machines and devices: Secondary | ICD-10-CM

## 2020-09-26 DIAGNOSIS — R7309 Other abnormal glucose: Secondary | ICD-10-CM | POA: Diagnosis not present

## 2020-09-26 DIAGNOSIS — R350 Frequency of micturition: Secondary | ICD-10-CM

## 2020-09-26 DIAGNOSIS — N270 Small kidney, unilateral: Secondary | ICD-10-CM

## 2020-09-26 DIAGNOSIS — E663 Overweight: Secondary | ICD-10-CM

## 2020-09-26 DIAGNOSIS — I10 Essential (primary) hypertension: Secondary | ICD-10-CM | POA: Diagnosis not present

## 2020-09-26 DIAGNOSIS — I700 Atherosclerosis of aorta: Secondary | ICD-10-CM

## 2020-09-26 DIAGNOSIS — N401 Enlarged prostate with lower urinary tract symptoms: Secondary | ICD-10-CM | POA: Diagnosis not present

## 2020-09-26 DIAGNOSIS — Z9181 History of falling: Secondary | ICD-10-CM

## 2020-09-26 DIAGNOSIS — N1831 Chronic kidney disease, stage 3a: Secondary | ICD-10-CM | POA: Diagnosis not present

## 2020-09-26 DIAGNOSIS — E349 Endocrine disorder, unspecified: Secondary | ICD-10-CM

## 2020-09-26 DIAGNOSIS — K589 Irritable bowel syndrome without diarrhea: Secondary | ICD-10-CM

## 2020-09-26 DIAGNOSIS — G250 Essential tremor: Secondary | ICD-10-CM | POA: Diagnosis not present

## 2020-09-26 DIAGNOSIS — Z79899 Other long term (current) drug therapy: Secondary | ICD-10-CM

## 2020-09-26 DIAGNOSIS — M545 Low back pain, unspecified: Secondary | ICD-10-CM

## 2020-09-26 DIAGNOSIS — S22080S Wedge compression fracture of T11-T12 vertebra, sequela: Secondary | ICD-10-CM

## 2020-09-26 DIAGNOSIS — E559 Vitamin D deficiency, unspecified: Secondary | ICD-10-CM

## 2020-09-26 NOTE — Patient Instructions (Addendum)
Cody Hines , Thank you for taking time to come for your Medicare Wellness Visit. I appreciate your ongoing commitment to your health goals. Please review the following plan we discussed and let me know if I can assist you in the future.   These are the goals we discussed: Goals    . Blood Pressure < 140/90    . Exercise 15-20 min daily    . HEMOGLOBIN A1C < 5.7       This is a list of the screening recommended for you and due dates:  Health Maintenance  Topic Date Due  . Pneumonia vaccines (2 of 2 - PPSV23) 09/18/2019  . COVID-19 Vaccine (3 - Booster for Pfizer series) 06/12/2020  . Flu Shot  01/03/2021*  . Tetanus Vaccine  09/26/2021*  . Colon Cancer Screening  04/23/2023  .  Hepatitis C: One time screening is recommended by Center for Disease Control  (CDC) for  adults born from [birth year] through 1963.   Completed  *Topic was postponed. The date shown is not the original due date.     Try voltaren gel, salon pas - arthritis pain cream        Fall Prevention in the Home, Adult Falls can cause injuries and can affect people from all age groups. There are many simple things that you can do to make your home safe and to help prevent falls. Ask for help when making these changes, if needed. What actions can I take to prevent falls? General instructions  Use good lighting in all rooms. Replace any light bulbs that burn out.  Turn on lights if it is dark. Use night-lights.  Place frequently used items in easy-to-reach places. Lower the shelves around your home if necessary.  Set up furniture so that there are clear paths around it. Avoid moving your furniture around.  Remove throw rugs and other tripping hazards from the floor.  Avoid walking on wet floors.  Fix any uneven floor surfaces.  Add color or contrast paint or tape to grab bars and handrails in your home. Place contrasting color strips on the first and last steps of stairways.  When you use a stepladder, make sure  that it is completely opened and that the sides are firmly locked. Have someone hold the ladder while you are using it. Do not climb a closed stepladder.  Be aware of any and all pets. What can I do in the bathroom?      Keep the floor dry. Immediately clean up any water that spills onto the floor.  Remove soap buildup in the tub or shower on a regular basis.  Use non-skid mats or decals on the floor of the tub or shower.  Attach bath mats securely with double-sided, non-slip rug tape.  If you need to sit down while you are in the shower, use a plastic, non-slip stool.  Install grab bars by the toilet and in the tub and shower. Do not use towel bars as grab bars. What can I do in the bedroom?  Make sure that a bedside light is easy to reach.  Do not use oversized bedding that drapes onto the floor.  Have a firm chair that has side arms to use for getting dressed. What can I do in the kitchen?  Clean up any spills right away.  If you need to reach for something above you, use a sturdy step stool that has a grab bar.  Keep electrical cables out of the way.  Do not use floor polish or wax that makes floors slippery. If you must use wax, make sure that it is non-skid floor wax. What can I do in the stairways?  Do not leave any items on the stairs.  Make sure that you have a light switch at the top of the stairs and the bottom of the stairs. Have them installed if you do not have them.  Make sure that there are handrails on both sides of the stairs. Fix handrails that are broken or loose. Make sure that handrails are as long as the stairways.  Install non-slip stair treads on all stairs in your home.  Avoid having throw rugs at the top or bottom of stairways, or secure the rugs with carpet tape to prevent them from moving.  Choose a carpet design that does not hide the edge of steps on the stairway.  Check any carpeting to make sure that it is firmly attached to the  stairs. Fix any carpet that is loose or worn. What can I do on the outside of my home?  Use bright outdoor lighting.  Regularly repair the edges of walkways and driveways and fix any cracks.  Remove high doorway thresholds.  Trim any shrubbery on the main path into your home.  Regularly check that handrails are securely fastened and in good repair. Both sides of any steps should have handrails.  Install guardrails along the edges of any raised decks or porches.  Clear walkways of debris and clutter, including tools and rocks.  Have leaves, snow, and ice cleared regularly.  Use sand or salt on walkways during winter months.  In the garage, clean up any spills right away, including grease or oil spills. What other actions can I take?  Wear closed-toe shoes that fit well and support your feet. Wear shoes that have rubber soles or low heels.  Use mobility aids as needed, such as canes, walkers, scooters, and crutches.  Review your medicines with your health care provider. Some medicines can cause dizziness or changes in blood pressure, which increase your risk of falling. Talk with your health care provider about other ways that you can decrease your risk of falls. This may include working with a physical therapist or trainer to improve your strength, balance, and endurance. Where to find more information  Centers for Disease Control and Prevention, STEADI: WebmailGuide.co.za  [employer] on Aging: BrainJudge.co.uk Contact a health care provider if:  You are afraid of falling at home.  You feel weak, drowsy, or dizzy at home.  You fall at home. Summary  There are many simple things that you can do to make your home safe and to help prevent falls.  Ways to make your home safe include removing tripping hazards and installing grab bars in the bathroom.  Ask for help when making these changes in your home. This information is not intended to replace advice  given to you by your health care provider. Make sure you discuss any questions you have with your health care provider. Document Revised: 09/04/2017 Document Reviewed: 05/07/2017 Elsevier Patient Education  2020 [employer].

## 2020-09-26 NOTE — Addendum Note (Signed)
Addended by: Chancy Hurter on: 09/26/2020 10:03 AM   Modules accepted: Orders

## 2020-10-01 ENCOUNTER — Other Ambulatory Visit: Payer: Self-pay | Admitting: Adult Health

## 2020-10-01 DIAGNOSIS — R413 Other amnesia: Secondary | ICD-10-CM

## 2020-10-01 DIAGNOSIS — R296 Repeated falls: Secondary | ICD-10-CM

## 2020-10-02 ENCOUNTER — Encounter: Payer: Self-pay | Admitting: Internal Medicine

## 2020-10-02 DIAGNOSIS — R413 Other amnesia: Secondary | ICD-10-CM

## 2020-10-09 ENCOUNTER — Ambulatory Visit (INDEPENDENT_AMBULATORY_CARE_PROVIDER_SITE_OTHER): Payer: Medicare Other | Admitting: Psychiatry

## 2020-10-09 ENCOUNTER — Other Ambulatory Visit: Payer: Self-pay

## 2020-10-09 DIAGNOSIS — F331 Major depressive disorder, recurrent, moderate: Secondary | ICD-10-CM

## 2020-10-09 NOTE — Progress Notes (Signed)
[facility] Counselor/Therapist Progress Note  Patient ID: Cody Philmore Quinnton Schilz., MRN: [id number],    Date: 10/09/2020  Time Spent: 60 minutes   8:00am to 9:00am  Treatment Type: Individual Therapy  Reported Symptoms: depression, anxiety  Mental Status Exam:  Appearance:   Casual     Behavior:  Appropriate and Sharing  Motor:  slowed and limping some due to back pain and states he's talked with his PCP  Speech/Language:   Clear and Coherent  Affect:  depressed, anxious  Mood:  anxious and depressed  Thought process:  some tangentiality  Thought content:    some rumination  Sensory/Perceptual disturbances:    WNL  Orientation:  oriented to person, place, time/date, situation, day of week, month of year and year  Attention:  Fair  Concentration:  Fair  Memory:  today admits more about his "forgetting things"  Fund of knowledge:   Fair  Insight:    Good and Fair  Judgment:   Good, Fair and patient reports "good" sometimes and other times "fair"  Impulse Control:  Good   Risk Assessment: Danger to Self:  No Self-injurious Behavior: No Danger to Others: No Duty to Warn:no Physical Aggression / Violence:No  Access to Firearms a concern: No  Gang Involvement:No   Subjective: Patient in today reporting depression, anxiety mostly related to personal and family situations.  Wife did move out about a week ago per patient.  States they were not getting along and lived as "room-mates".    Interventions: Solution-Oriented/Positive Psychology and Ego-Supportive  Diagnosis:   ICD-10-CM   1. Major depressive disorder, recurrent episode, moderate ([place])  F33.1      Plan: Patient is not signing tx goals on computer screen due to [place].  Treatment Goals: Goals remain on tx plan as patient works on strategies to achieve his goals. Progress is documented each session in the "Progress" section of Plan.   Long term goal: Develop healthy cognitive patterns and beliefs  about self and the world that lead to alleviation and help prevent relapse of depression.  Short term goal: Identify and replace depressive thinking that leads to depressive feelings and actions.He will replace the depressive thinking with more positive, hopeful thoughts that can lead to more positive and hopeful feelings.  Strategy: Patient to more consistently monitor his thoughts and catch the depressive/negative thoughts, trying to change them to positiveand more reality-based thought patterns.Look at his negative thoughts and weigh them against the evidence.  PROGRESS: Patient in today reporting depression and anxiety related to personal/family situations and relationships.  I shared with patient the info received from daughter about her and her brother's concerns about their dad's memory and some changes they've noticed indicating possible cognitive changes.  Patient has not been as consistent with his appts here at [facility] and stated that he was having other medical appts for physical concerns, and has just been involved healing from back issues, and today acknowledges" he doesn't always remember things." Does admit that his "memory has gotten some worse, but I'm not sure about the paranoia that family has mentioned." States he sleeps off and on during the night, interrupted by getting up to go to bathroom once a night. Personal hygiene has decreased from showering daily to 2-3 times a week. States most of his current depression is related to his separated wife and how "she blames me" and patient easily goes off on tangents related to his frustration and anger with her and has difficulty diverting his  attention in other directions that could be more positive for him.   Processed a lot of the negative feelings he gets stuck on with separated wife and very hard for him to let go, even though he states that he is good about "letting go." Tends to overly focus on the negatives versus  positives.  He reports he is staying in touch with some of his long time friends that live locally, and stays in touch with his son and daughter and their families. Some depression about not being able to do everything I used to do, which we discussed more in session today. Did state that he has upcoming appt with Neurologist for evaluation especially with his memory concerns.  Encouraged patient to stay in contact with closer friends that check in on him and often have patient over to their home for dinner, get out of the house some daily, continue to enjoy spending time with his dog, stay in touch with son and daughter and their families as well as his extended family of sisters and their spouses, increase as able his personal hygiene as that can contribute to his overall health, and to practice looking for at least 1 positive each day.  Goal review and progress/challenges noted with patient.  Next appt within 2-3 weeks.   Cody Fare, LCSW

## 2020-10-11 ENCOUNTER — Other Ambulatory Visit: Payer: Self-pay | Admitting: Internal Medicine

## 2020-10-24 ENCOUNTER — Institutional Professional Consult (permissible substitution): Payer: Medicare Other | Admitting: Neurology

## 2020-10-30 ENCOUNTER — Ambulatory Visit (INDEPENDENT_AMBULATORY_CARE_PROVIDER_SITE_OTHER): Payer: Medicare Other | Admitting: Psychiatry

## 2020-10-30 ENCOUNTER — Other Ambulatory Visit: Payer: Self-pay

## 2020-10-30 DIAGNOSIS — F411 Generalized anxiety disorder: Secondary | ICD-10-CM | POA: Diagnosis not present

## 2020-10-30 NOTE — Progress Notes (Signed)
[facility] Counselor/Therapist Progress Note  Patient ID: Rajah Philmore Abdullahi Vallone., MRN: [id number],    Date: 10/30/2020  Time Spent: 50 minutes   9:00am to 9:50am   Treatment Type: Individual Therapy  Reported Symptoms: anxiety, depression  Mental Status Exam:  Appearance:   Casual     Behavior:  Appropriate and Sharing  Motor:  more slowed in walking and not as steady  Speech/Language:   Clear and Coherent  Affect:  anxious, some sadness re: death recently of brother in law  Mood:  anxious and depressed  Thought process:  goal directed  Thought content:    some ruminating  Sensory/Perceptual disturbances:    WNL  Orientation:  oriented to person, place, time/date, situation, day of week, month of year and year  Attention:  Good  Concentration:  Fair  Memory:  some forgetfulness and worse when stressed  Fund of knowledge:   Good  Insight:    Fair  Judgment:   Good and Fair  Impulse Control:  Good   Risk Assessment: Danger to Self:  No Self-injurious Behavior: No Danger to Others: No Duty to Warn:no Physical Aggression / Violence:No  Access to Firearms a concern: No  Gang Involvement:No   Subjective: Patient reports anxiety as main symptom, with some depression also. Sleep varies at times, some nights "I wake up every 2-3 hrs and other nights are better."  "Dont like having to take the medication" but as we talked further he understood that he does sleep better on the meds.  Interventions: Solution-Oriented/Positive Psychology and Ego-Supportive  Diagnosis:   ICD-10-CM   1. Generalized anxiety disorder  F41.1     Plan: Patient is not signing tx goals on computer screen due to [place].  Treatment Goals: Goals remain on tx plan as patient works on strategies to achieve his goals. Progress is documented each session in the "Progress" section of Plan.   Long term goal: Develop healthy cognitive patterns and beliefs about self and the world that lead  to alleviation and help prevent relapse of depression.  Short term goal: Identify and replace depressive thinking that leads to depressive feelings and actions.He will replace the depressive thinking with more positive, hopeful thoughts that can lead to more positive and hopeful feelings.  Strategy: Patient to more consistently monitor his thoughts and catch the depressive/negative thoughts, trying to change them to positiveand more reality-based thought patterns.Look at his negative thoughts and weigh them against the evidence.  PROGRESS: Patient in today reporting anxiety as main symptom, along with some depression. Brother-in-law died a week ago (A-fib and lung issues). Some sleep issues when he doesn't take meds but on the meds does sleep better. Processes today the death a week ago of brother-in-law who had A-fib and lung issues.  Does seem to have some hearing difficulty at times and admits that he's "not quite as stable on my feet and is using a cane some when I go out to stores like [employer]." Talked with patient re: safety concerns when walking around especially walking in uneven areas, poorly lit areas, and also with his "100lbs dog" jumping on him at times. Depressive/anxious thoughts still persist, although has times "when it's not as bad", and is mostly related to family/personal situations. Acknowledges his memory has "gotten some worse" but feels it's been slow. Reports hygiene changes including reducing his taking a shower from daily to once every 2-3 days. Didn't dwell as much today on negative aspects of prior marriage to now-separated  wife, and only mentioned her 4-5 times in session today. Admits to overly focusing on negatives rather than positives.  He denies any SI.  Does state that he tries to stay in touch some with former co-workers, a few friends, his sisters, and his son and daughter and their families. Miss recent Neuro exam for memory concerns---cancelled due to ice/snow  and it has been rescheduled for February. Worked today with the short-term goal and treatment plan above re:: His depressive/negative thoughts and trying to be looking more for some positives every day which could help him feel more positive and hopeful.  Encouraged him to put forth more effort to see some positives daily, to stay in contact with friends and family that are supportive of him and check in on him, to get outside daily, to try and maintain good personal hygiene as that can contribute to his overall health, to continue enjoying time spent with his dog, and to remain on medications as prescribed.  Goal review and progress/challenges noted with patient.  Next appt within 3 weeks.   Shanon Ace, LCSW

## 2020-10-31 ENCOUNTER — Other Ambulatory Visit: Payer: Self-pay | Admitting: Internal Medicine

## 2020-10-31 DIAGNOSIS — E782 Mixed hyperlipidemia: Secondary | ICD-10-CM

## 2020-11-07 ENCOUNTER — Ambulatory Visit: Payer: Medicare Other | Admitting: Psychiatry

## 2020-11-14 ENCOUNTER — Encounter: Payer: Self-pay | Admitting: Adult Health

## 2020-11-14 ENCOUNTER — Ambulatory Visit: Payer: Medicare Other | Admitting: Adult Health

## 2020-11-14 ENCOUNTER — Other Ambulatory Visit: Payer: Self-pay

## 2020-11-14 VITALS — BP 118/60 | HR 70 | Ht 73.0 in | Wt 196.0 lb

## 2020-11-14 DIAGNOSIS — Z9989 Dependence on other enabling machines and devices: Secondary | ICD-10-CM

## 2020-11-14 DIAGNOSIS — G4733 Obstructive sleep apnea (adult) (pediatric): Secondary | ICD-10-CM | POA: Diagnosis not present

## 2020-11-14 NOTE — Progress Notes (Signed)
PATIENT: Cody Hines Cody Hines. DOB: [birth year]  REASON FOR VISIT: follow up HISTORY FROM: patient  HISTORY OF PRESENT ILLNESS: Today 11/14/20:  Cody Hines is a 75 year old male with a history of obstructive sleep apnea on CPAP.  He returns today for follow-up.  He reports that he tries to use his machine as much as possible.  He does state that sometimes his nose is dry and it causes his nose to bleed.  Therefore is unable to use the machine.  Also reports that he has been under more stress over the last year as his wife left him and his brother passed away.  His download is below  Compliance Report: Compliance  Usage 10/15/2020 - 11/13/2020 Usage days 23/30 days (77%) >= 4 hours 10 days (33%) Average usage (days used) 3 hours 55 minutes  AirSense 10 AutoSet Serial number [id number] Mode CPAP Set pressure 11 cmH2O EPR Fulltime EPR level 2  Therapy Leaks - L/min Median: 11.0 95th percentile: 31.5 Maximum: 45.5 Events per hour AI: 0.9 HI: 0.3 AHI: 1.2 Apnea Index Central: 0.7 Obstructive: 0.1 Unknown: 0.1  05/14/20: Cody Hines is a 75 year old male with a history of obstructive sleep apnea on CPAP.  His download indicates that he use his machine for 30 days for compliance of 73%.  He uses machine greater than 4 hours 17 days for compliance of 57%.  On average he uses his machine 5 hours and 9 minutes.  His residual AHI is 1.2 on 11 cm of water with EPR to leak in the 95th percentile is 34.1 L/min.  Reports that he has had a migraine for 2 days.  Reports that he has a remote history of migraine that has gotten worse since he had eye surgery.    HISTORY08/04/20:  Cody Hines is a 75 year old male with a history of obstructive sleep apnea on CPAP.  He returns today for follow-up.  His CPAP download indicates that he uses machine 30 out of 30 days for compliance of 100%.  He uses machine greater than 4 hours 16 days for compliance of 53%.  On average he uses his machine 4 hours  and 18 minutes.  His residual AHI is 0.7 on 11 cm of water with EPR of 2.  His leak in the 95th percentile is 19.1 liters per minute.  He states that there are some nights that he unknowingly takes off his mask.  He states that he wakes up from notices this he does put the mask back on.  He also notes that some nights he has allergies and finds it is hard to keep the nasal pillows on.  He returns today for follow-up   REVIEW OF SYSTEMS: Out of a complete 14 system review of symptoms, the patient complains only of the following symptoms, and all other reviewed systems are negative.   ALLERGIES: Allergies  Allergen Reactions  . Asa [Aspirin] Diarrhea, Nausea Only and Other (See Comments)    History of ulcers, also  . Atorvastatin Other (See Comments)    Muscle aches  . Celebrex [Celecoxib] Other (See Comments)    Headaches   . Codeine Nausea And Vomiting  . Savella [Milnacipran Hcl] Other (See Comments)    Reaction not recalled  . Viagra [Sildenafil Citrate] Other (See Comments)    Headaches  . Penicillins Rash and Other (See Comments)    HOME MEDICATIONS: Outpatient Medications Prior to Visit  Medication Sig Dispense Refill  . acetaminophen (TYLENOL) 650 MG CR tablet Take 650  mg by mouth every 8 (eight) hours as needed for pain (or back pain or migraines).     Marland Kitchen aspirin EC 81 MG tablet Take 81 mg by mouth daily.    . Cholecalciferol (VITAMIN D3) 250 MCG (10000 UT) capsule Take 10,000 Units by mouth daily.    . enalapril (VASOTEC) 20 MG tablet Take      1 tablet       Daily       for BP 90 tablet 0  . Flaxseed, Linseed, (FLAX SEED OIL) 1000 MG CAPS Take 2,000 mg by mouth daily.     Marland Kitchen LORazepam (ATIVAN) 1 MG tablet Take 1 tablet (1 mg total) by mouth at bedtime as needed for sleep. As needed 30 tablet 5  . Magnesium 500 MG TABS Take 500 mg by mouth daily.     . rosuvastatin (CRESTOR) 40 MG tablet Take  1 tablet  Daily   For Cholesterol 90 tablet 0  . sertraline (ZOLOFT) 100 MG tablet  Take 2 tablets (200 mg total) by mouth daily. 180 tablet 2   No facility-administered medications prior to visit.    PAST MEDICAL HISTORY: Past Medical History:  Diagnosis Date  . BPH (benign prostatic hyperplasia)   . Cancer (HCC)    nose, skin cancer  . Elevated hemoglobin A1c   . Fibromyalgia   . GERD (gastroesophageal reflux disease)   . Hyperlipidemia   . Hypertension   . Hypogonadism male   . IBS (irritable bowel syndrome)   . OSA (obstructive sleep apnea)     PAST SURGICAL HISTORY: Past Surgical History:  Procedure Laterality Date  .  nasal smr np3  1985  . CATARACT EXTRACTION, BILATERAL Bilateral 2021   Dr. Katy Fitch, L 5/27, R 3/25  . KNEE ARTHROSCOPY Left 1999  . SKIN CANCER EXCISION  2020   nose  . SPINE SURGERY  2007   L5 S 1 Disk  . SPINE SURGERY  09/2019   Compression fracture stabilization by Dr. Arnoldo Morale  . VASECTOMY  1983    FAMILY HISTORY: Family History  Problem Relation Age of Onset  . Cancer Mother        breast  . Cancer Father        lung  . Diabetes Father   . Heart disease Sister   . Arthritis Sister     SOCIAL HISTORY: Social History   Socioeconomic History  . Marital status: Married    Spouse name: Cody Hines  . Number of children: Not on file  . Years of education: Not on file  . Highest education level: Not on file  Occupational History  . 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 Smoking status: Former Smoker    Years: 30.00    Types: Cigarettes    Quit date: 10/06/1980    Years since quitting: 40.1  . Smokeless tobacco: Never Used  Vaping Use  . Vaping Use: Never used  Substance and Sexual Activity  . Alcohol use: No    Comment: Not drinking x 1 month  . Drug use: No  . Sexual activity: Not Currently  Other Topics Concern  . Not on file  Social History Narrative  . Not on file   Social Determinants of Health   Financial Resource Strain: Not on file  Food Insecurity: Not on file  Transportation Needs: Not on file   Physical Activity: Not on file  Stress: Not on file  Social Connections: Not on file  Intimate Partner Violence: Not  on file      PHYSICAL EXAM  Vitals:   11/14/20 0916  BP: 118/60  Pulse: 70  Weight: 196 lb (88.9 kg)  Height: 6\' 1"  (1.854 m)   Body mass index is 25.86 kg/m.  Generalized: Well developed, in no acute distress  Chest: Lungs clear to auscultation bilaterally  Neurological examination  Mentation: Alert oriented to time, place, history taking. Follows all commands speech and language fluent Cranial nerve II-XII: Extraocular movements were full, visual field were full on confrontational test Head turning and shoulder shrug  were normal and symmetric. Motor: The motor testing reveals 5 over 5 strength of all 4 extremities. Good symmetric motor tone is noted throughout.  Sensory: Sensory testing is intact to soft touch on all 4 extremities. No evidence of extinction is noted.  Gait and station: Gait is normal.    DIAGNOSTIC DATA (LABS, IMAGING, TESTING) - I reviewed patient records, labs, notes, testing and imaging myself where available.  Lab Results  Component Value Date   WBC 4.3 07/12/2020   HGB 13.2 07/12/2020   HCT 39.6 07/12/2020   MCV 91.5 07/12/2020   PLT 133 (L) 07/12/2020      Component Value Date/Time   NA 138 07/12/2020 1101   K 3.6 07/12/2020 1101   CL 102 07/12/2020 1101   CO2 29 07/12/2020 1101   GLUCOSE 208 (H) 07/12/2020 1101   BUN 11 07/12/2020 1101   CREATININE 1.46 (H) 07/12/2020 1101   CALCIUM 9.7 07/12/2020 1101   PROT 6.2 07/12/2020 1101   ALBUMIN 3.9 06/22/2019 2128   AST 30 07/12/2020 1101   ALT 17 07/12/2020 1101   ALKPHOS 93 06/22/2019 2128   BILITOT 1.0 07/12/2020 1101   GFRNONAA 48 (L) 07/12/2020 1101   GFRAA 55 (L) 07/12/2020 1101   Lab Results  Component Value Date   CHOL 126 07/12/2020   HDL 53 07/12/2020   LDLCALC 57 07/12/2020   TRIG 76 07/12/2020   CHOLHDL 2.4 07/12/2020   Lab Results  Component Value  Date   HGBA1C 5.6 07/12/2020   Lab Results  Component Value Date   VITAMINB12 370 07/19/2019   Lab Results  Component Value Date   TSH 1.89 07/12/2020      ASSESSMENT AND PLAN 75 y.o. year old male  has a past medical history of BPH (benign prostatic hyperplasia), Cancer ([place]), Elevated hemoglobin A1c, Fibromyalgia, GERD (gastroesophageal reflux disease), Hyperlipidemia, Hypertension, Hypogonadism male, IBS (irritable bowel syndrome), and OSA (obstructive sleep apnea). here with:  1. OSA on CPAP  - CPAP compliance suboptimal - Good treatment of AHI  -I increase his humidity to six - Encourage patient to use CPAP nightly and > 4 hours each night - F/U in 1 year or sooner if needed   I spent 25 minutes of face-to-face and non-face-to-face time with patient.  This included previsit chart review, lab review, study review, order entry, electronic health record documentation, patient education.  Ward Givens, MSN, NP-C 11/14/2020, 9:09 AM [facility] [address], [place], [place] [address] [phone]

## 2020-11-14 NOTE — Patient Instructions (Signed)
Continue using CPAP nightly and greater than 4 hours each night °If your symptoms worsen or you develop new symptoms please let us know.  ° °

## 2020-11-21 ENCOUNTER — Ambulatory Visit (INDEPENDENT_AMBULATORY_CARE_PROVIDER_SITE_OTHER): Payer: Medicare Other | Admitting: Psychiatry

## 2020-11-21 ENCOUNTER — Other Ambulatory Visit: Payer: Self-pay

## 2020-11-21 DIAGNOSIS — F331 Major depressive disorder, recurrent, moderate: Secondary | ICD-10-CM

## 2020-11-21 NOTE — Progress Notes (Signed)
[facility] Counselor/Therapist Progress Note  Patient ID: Cody Hines Ichael Pullara., MRN: [id number],    Date: 11/21/2020  Time Spent: 50 minutes   8:00am to 8:50am  Treatment Type: Individual Therapy  Reported Symptoms: depression, anxiety  Mental Status Exam:  Appearance:   Casual     Behavior:  Appropriate and Sharing  Motor:  some slower movement in walking  Speech/Language:   Clear and Coherent  Affect:  anxious, depressed  Mood:  anxious and depressed  Thought process:  some tangentiality  Thought content:    some ruminating  Sensory/Perceptual disturbances:    WNL  Orientation:  oriented to person, place, time/date, situation, day of week, month of year and year  Attention:  Fair  Concentration:  Fair  Memory:  forgetfulness with "where I put things sometimes"; "using a list helps"  Fund of knowledge:   Fair  Insight:    Fair  Judgment:   Good and Fair  Impulse Control:  Good   Risk Assessment: Danger to Self:  No Self-injurious Behavior: No Danger to Others: No Duty to Warn:no Physical Aggression / Violence:No  Access to Firearms a concern: No  Gang Involvement:No   Subjective: Patient today reporting depression as main symptom, and also anxiety, frustration, overthinks a lot.   Interventions: Solution-Oriented/Positive Psychology and Ego-Supportive  Diagnosis:   ICD-10-CM   1. Major depressive disorder, recurrent episode, moderate ([place])  F33.1      Plan: Patient is not signing tx goals on computer screen due to [place].  Treatment Goals: Goals remain on tx plan as patient works on strategies to achieve his goals. Progress is documented each session in the "Progress" section of Plan.   Long term goal: Develop healthy cognitive patterns and beliefs about self and the world that lead to alleviation and help prevent relapse of depression.  Short term goal: Identify and replace depressive thinking that leads to depressive feelings and  actions.He will replace the depressive thinking with more positive, hopeful thoughts that can lead to more positive and hopeful feelings.  Strategy: Patient to more consistently monitor his thoughts and catch the depressive/negative thoughts, trying to change them to positiveand more reality-based thought patterns.Look at his negative thoughts and weigh them against the evidence.  PROGRESS: Patient in today reporting anxiety and depression, with depression being identified as his stronger symptom. Denies any SI. Ongoing issues with separated wife. Very difficult for patient to "let go of negatives" although it seems that since wife moved out of home, things have been some better.  States his forgetfulness "is about the same and I make notes to remind myself and that helps some."  Followed up on some safety concerns we discussed last session and he reports being "extra careful about watching my steps, not letting 100lbs dog jump on him, being more careful in driving, and is using a cane most of the time.He states his "memory has gotten some worse but it's been very gradual." Reports hygiene has improved a little, going from showering every 3 days to every other day." Anxious/depressive thoughts continue "and almost always is about family things" related to himself and separated wife mostly.  Encouraging patient to remain in contact with some of his longtime friends, his sisters, his son and daughter and their families, and some former coworkers which he enjoys.  Worked some today, following up from last session, with his short-term goal on treatment plan above, and in regards to encouraging him to look for more positives versus  negatives every day.  Also encouraged patient to get outside some each day if weather permits, to continue using his cane when he is being mobile as he does report that provides more stability for him, to continue his improved personal hygiene that can contribute to his overall  health, to enjoy spending time with his dog, remain on meds as prescribed, and letting his adult son be more helpful in managing his affairs as he is offered and patient does seem more willing for that to happen at this point.  He had missed a neurological appointment earlier during inclement weather he is scheduled tomorrow for that appointment with Dr. Brett Fairy.  Goal review and progress/challenges noted with patient.  Next appt within 3-4 weeks.   Shanon Ace, LCSW

## 2020-11-22 ENCOUNTER — Telehealth: Payer: Self-pay | Admitting: Neurology

## 2020-11-22 ENCOUNTER — Encounter: Payer: Self-pay | Admitting: Neurology

## 2020-11-22 ENCOUNTER — Ambulatory Visit: Payer: Medicare Other | Admitting: Neurology

## 2020-11-22 ENCOUNTER — Encounter: Payer: Self-pay | Admitting: Counselor

## 2020-11-22 VITALS — BP 124/62 | HR 79 | Ht 73.0 in | Wt 201.3 lb

## 2020-11-22 DIAGNOSIS — S22080S Wedge compression fracture of T11-T12 vertebra, sequela: Secondary | ICD-10-CM | POA: Diagnosis not present

## 2020-11-22 DIAGNOSIS — R296 Repeated falls: Secondary | ICD-10-CM

## 2020-11-22 DIAGNOSIS — R2689 Other abnormalities of gait and mobility: Secondary | ICD-10-CM | POA: Diagnosis not present

## 2020-11-22 DIAGNOSIS — S069X9S Unspecified intracranial injury with loss of consciousness of unspecified duration, sequela: Secondary | ICD-10-CM | POA: Diagnosis not present

## 2020-11-22 DIAGNOSIS — G3184 Mild cognitive impairment, so stated: Secondary | ICD-10-CM

## 2020-11-22 NOTE — Telephone Encounter (Signed)
[employer] medicare order sent to GI. No auth they will reach out to the patient to schedule.  
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
## 2020-11-22 NOTE — Progress Notes (Signed)
Cm sent to [facility]

## 2020-11-22 NOTE — Patient Instructions (Signed)
Fall Prevention in the Home, Adult Falls can cause injuries and can happen to people of all ages. There are many things you can do to make your home safe and to help prevent falls. Ask for help when making these changes. What actions can I take to prevent falls? General Instructions  Use good lighting in all rooms. Replace any light bulbs that burn out.  Turn on the lights in dark areas. Use night-lights.  Keep items that you use often in easy-to-reach places. Lower the shelves around your home if needed.  Set up your furniture so you have a clear path. Avoid moving your furniture around.  Do not have throw rugs or other things on the floor that can make you trip.  Avoid walking on wet floors.  If any of your floors are uneven, fix them.  Add color or contrast paint or tape to clearly mark and help you see: ? Grab bars or handrails. ? First and last steps of staircases. ? Where the edge of each step is.  If you use a stepladder: ? Make sure that it is fully opened. Do not climb a closed stepladder. ? Make sure the sides of the stepladder are locked in place. ? Ask someone to hold the stepladder while you use it.  Know where your pets are when moving through your home. What can I do in the bathroom?  Keep the floor dry. Clean up any water on the floor right away.  Remove soap buildup in the tub or shower.  Use nonskid mats or decals on the floor of the tub or shower.  Attach bath mats securely with double-sided, nonslip rug tape.  If you need to sit down in the shower, use a plastic, nonslip stool.  Install grab bars by the toilet and in the tub and shower. Do not use towel bars as grab bars.      What can I do in the bedroom?  Make sure that you have a light by your bed that is easy to reach.  Do not use any sheets or blankets for your bed that hang to the floor.  Have a firm chair with side arms that you can use for support when you get dressed. What can I do in  the kitchen?  Clean up any spills right away.  If you need to reach something above you, use a step stool with a grab bar.  Keep electrical cords out of the way.  Do not use floor polish or wax that makes floors slippery. What can I do with my stairs?  Do not leave any items on the stairs.  Make sure that you have a light switch at the top and the bottom of the stairs.  Make sure that there are handrails on both sides of the stairs. Fix handrails that are broken or loose.  Install nonslip stair treads on all your stairs.  Avoid having throw rugs at the top or bottom of the stairs.  Choose a carpet that does not hide the edge of the steps on the stairs.  Check carpeting to make sure that it is firmly attached to the stairs. Fix carpet that is loose or worn. What can I do on the outside of my home?  Use bright outdoor lighting.  Fix the edges of walkways and driveways and fix any cracks.  Remove anything that might make you trip as you walk through a door, such as a raised step or threshold.  Trim any   bushes or trees on paths to your home.  Check to see if handrails are loose or broken and that both sides of all steps have handrails.  Install guardrails along the edges of any raised decks and porches.  Clear paths of anything that can make you trip, such as tools or rocks.  Have leaves, snow, or ice cleared regularly.  Use sand or salt on paths during winter.  Clean up any spills in your garage right away. This includes grease or oil spills. What other actions can I take?  Wear shoes that: ? Have a low heel. Do not wear high heels. ? Have rubber bottoms. ? Feel good on your feet and fit well. ? Are closed at the toe. Do not wear open-toe sandals.  Use tools that help you move around if needed. These include: ? Canes. ? Walkers. ? Scooters. ? Crutches.  Review your medicines with your doctor. Some medicines can make you feel dizzy. This can increase your chance  of falling. Ask your doctor what else you can do to help prevent falls. Where to find more information  Centers for Disease Control and Prevention, STEADI: www.cdc.gov  National Institute on Aging: www.nia.nih.gov Contact a doctor if:  You are afraid of falling at home.  You feel weak, drowsy, or dizzy at home.  You fall at home. Summary  There are many simple things that you can do to make your home safe and to help prevent falls.  Ways to make your home safe include removing things that can make you trip and installing grab bars in the bathroom.  Ask for help when making these changes in your home. This information is not intended to replace advice given to you by your health care provider. Make sure you discuss any questions you have with your health care provider. Document Revised: 04/25/2020 Document Reviewed: 04/25/2020 Elsevier Patient Education  2021 Elsevier Inc.  

## 2020-12-05 ENCOUNTER — Ambulatory Visit
Admission: RE | Admit: 2020-12-05 | Discharge: 2020-12-05 | Disposition: A | Discharge status: Home / Self Care | Payer: 59 | Source: Ambulatory Visit | Attending: Neurology | Admitting: Neurology

## 2020-12-05 ENCOUNTER — Other Ambulatory Visit: Payer: Self-pay

## 2020-12-05 ENCOUNTER — Ambulatory Visit
Admission: RE | Admit: 2020-12-05 | Discharge: 2020-12-05 | Disposition: A | Discharge status: Home / Self Care | Payer: Medicare Other | Source: Ambulatory Visit | Attending: Neurology | Admitting: Neurology

## 2020-12-05 DIAGNOSIS — S069X9S Unspecified intracranial injury with loss of consciousness of unspecified duration, sequela: Secondary | ICD-10-CM

## 2020-12-05 DIAGNOSIS — G3184 Mild cognitive impairment, so stated: Secondary | ICD-10-CM

## 2020-12-05 DIAGNOSIS — R296 Repeated falls: Secondary | ICD-10-CM

## 2020-12-05 DIAGNOSIS — S22080S Wedge compression fracture of T11-T12 vertebra, sequela: Secondary | ICD-10-CM

## 2020-12-05 DIAGNOSIS — R2689 Other abnormalities of gait and mobility: Secondary | ICD-10-CM

## 2020-12-10 ENCOUNTER — Telehealth: Payer: Self-pay | Admitting: Neurology

## 2020-12-10 NOTE — Progress Notes (Signed)
Normal brain MRI

## 2020-12-10 NOTE — Progress Notes (Signed)
75 year old male with gait difficulty: IMPRESSION:   MRI thoracic spine without contrast demonstrating: -Chronic compression fracture of T12 vertebral body with approximately 60% loss of vertebral body height.  No significant residual edema compared to 2020. -No spinal stenosis or foraminal narrowing.    INTERPRETING PHYSICIAN:  Penni Bombard, MD

## 2020-12-10 NOTE — Telephone Encounter (Signed)
Called the patient and was able to review the results of the MRI of the brain and thoracic spine.  Informed the patient the MRI of the brain was normal.  Informed the patient that the thoracic spine showed an old compression fracture.  Informed that there was no evidence of impingement on the nerves and no evidence of narrowing on the spinal canal.  Patient verbalized understanding.  Patient starts physical therapy tomorrow   MRI thoracic spine without contrast demonstrating:  -Chronic compression fracture of T12 vertebral body with approximately 60% loss of vertebral body height. No significant residual edema compared to 2020.  -No spinal stenosis or foraminal narrowing.

## 2020-12-10 NOTE — Telephone Encounter (Signed)
-----   Message from Larey Seat, MD sent at 12/10/2020  1:40 PM EST ----- Normal brain MRI

## 2020-12-12 ENCOUNTER — Ambulatory Visit: Payer: Medicare Other | Attending: Neurology

## 2020-12-12 ENCOUNTER — Other Ambulatory Visit: Payer: Self-pay

## 2020-12-12 DIAGNOSIS — M545 Low back pain, unspecified: Secondary | ICD-10-CM | POA: Insufficient documentation

## 2020-12-12 DIAGNOSIS — R296 Repeated falls: Secondary | ICD-10-CM | POA: Insufficient documentation

## 2020-12-12 DIAGNOSIS — Z7409 Other reduced mobility: Secondary | ICD-10-CM | POA: Diagnosis present

## 2020-12-12 DIAGNOSIS — M6281 Muscle weakness (generalized): Secondary | ICD-10-CM | POA: Insufficient documentation

## 2020-12-12 DIAGNOSIS — G8929 Other chronic pain: Secondary | ICD-10-CM | POA: Diagnosis present

## 2020-12-12 NOTE — Therapy (Signed)
Outpatient Physical Therapy Neuro Evaluation Patient Name: Cody Hines. MRN: [id number] DOB:[birth year], 75 y.o., male Today's Date: 12/12/2020  PCP: [name], MD Referring Provier: Dohmeier, Asencion Partridge, MD   PT End of Session - 12/12/20 1547    Visit Number 1    Number of Visits 17    Date for PT Re-Evaluation 02/06/21    PT Start Time 1410    PT Stop Time 1500    PT Time Calculation (min) 50 min    Equipment Utilized During Treatment Gait belt    Activity Tolerance Patient tolerated treatment well    Behavior During Therapy WFL for tasks assessed/performed           Past Medical History:  Diagnosis Date  . BPH (benign prostatic hyperplasia)   . Cancer (HCC)    nose, skin cancer  . Elevated hemoglobin A1c   . Fibromyalgia   . GERD (gastroesophageal reflux disease)   . Hyperlipidemia   . Hypertension   . Hypogonadism male   . IBS (irritable bowel syndrome)   . OSA (obstructive sleep apnea)    Past Surgical History:  Procedure Laterality Date  .  nasal smr np3  1985  . CATARACT EXTRACTION, BILATERAL Bilateral 2021   Dr. Katy Fitch, L 5/27, R 3/25  . KNEE ARTHROSCOPY Left 1999  . SKIN CANCER EXCISION  2020   nose  . SPINE SURGERY  2007   L5 S 1 Disk  . SPINE SURGERY  09/2019   Compression fracture stabilization by Dr. Arnoldo Morale  . VASECTOMY  1983   Patient Active Problem List   Diagnosis Date Noted  . Small right kidney 04/05/2020  . Thoracic aorta atherosclerosis ([place]) 04/04/2020  . At moderate risk for fall 09/27/2019  . Compression fracture of T12 vertebra ([place]) 09/27/2019  . Hyperlipidemia, mixed 09/26/2019  . Personal history of skin cancer 09/26/2019  . CKD (chronic kidney disease) stage 3, GFR 30-59 ml/min (HCC) 03/23/2019  . Major depressive disorder, recurrent episode, moderate ([place]) 07/19/2018  . Chronic lumbar pain 03/12/2018  . Benign essential tremor 12/19/2016  . Encounter for Medicare annual wellness exam 08/10/2015  . Overweight  (BMI 25.0-29.9) 08/10/2015  . Medication management 05/03/2014  . Vitamin D deficiency 05/03/2014  . Essential hypertension   . GERD (gastroesophageal reflux disease)   . Other abnormal glucose (prediabetes)   . IBS (irritable bowel syndrome)   . OSA on CPAP   . Fibromyalgia   . BPH (benign prostatic hyperplasia)   . Testosterone deficiency     Referring Diagnosis:  S22.080S (ICD-10-CM) - Compression fracture of T12 vertebra, sequela  S06.9X9S (ICD-10-CM) - Traumatic brain injury with loss of consciousness, sequela (HCC)  R29.6 (ICD-10-CM) - Frequent falls  G31.84 (ICD-10-CM) - MCI (mild cognitive impairment) with memory loss  R26.89 (ICD-10-CM) - Multifactorial gait disorder  R26.89 (ICD-10-CM) - Shuffling gait    Therapy Diagnosis: Chronic bilateral low back pain without sciatica - Plan: PT plan of care cert/re-cert  Impaired functional mobility, balance, gait, and endurance - Plan: PT plan of care cert/re-cert  Muscle weakness (generalized) - Plan: PT plan of care cert/re-cert  Repeated falls - Plan: PT plan of care cert/re-cert  SUBJECTIVE:  Patient history: in February, he was putting dog food in bowl and fell forward. This week, he was picking up reflectors that lawn maintenance knocked in out. When he stood up from bent position, he couldn't control body and fell backwards. In Sept 2019, he fell of a ladder with 9 feet fall  and sustained compression fractures. He had kyphoplasty with Dr. Arnoldo Morale. Pt denies numbness or tingling or bowel or bladder issues. Before lumbar surgery, he had radicular symptoms in L leg. Pt denies leg pain or numbness currently.  Pain: is patient experiencing pain? Yes                VAS scale: 2-3/10              Pain location: central back              Pain orientation: Bilateral               PAIN TYPE: aching and tight              Pain description: constant and aching               Aggravating factors: prolonged standing, walking, bending,  lifting              Relieving factors: lying down  Precautions: Fall  Weight bearing restrictions: No  Falls: Has patient fallen in last 6 months? Yes, Number of falls: 8-10 stumbles per week (near falls), 2 major falls to floor in last 6 months  Living Environment:  Lives with: places; lives with: lives alone  Lives in: House/apartment  Stairs: Yes; Internal: 12 steps; Rail on R going up and External: 10 steps; Rail on R going up  Has following equipment at home: Single point cane  Prior level of function: Independent  Patient Goals: Improve balance, be more stable,   OBJECTIVE:   Diagnostic findings:  Unremarkable MRI of the brain without contrast. No acute findings.     MRI thoracic spine without contrast demonstrating: -Chronic compression fracture of T12 vertebral body with approximately 60% loss of vertebral body height.  No significant residual edema compared to 2020. -No spinal stenosis or foraminal narrowing.     Cognition:  Overall cognitive status: Within functional limits for tasks assessed   Memory: Impaired per patient      Sensation:  Light touch: Appears intact    MMT:  MMT Right 12/12/2020 Left 12/12/2020  Hip flexion 5/5 5/5  Hip abduction 5/5 5/5  Hip adduction 5/5 5/5  Knee flexion 5/5 5/5  Knee extension 5/5 5/5  Ankle dorsiflexion 5/5 4/5   Transfers:  Assistive device utilized: None   Gait: Distance walked: 150 Assistive device utilized: None Level of assistance: Complete Independence Comments: slow cadence, foot slap on L, decreased L ankle DF on L, decreased step length L-R, decreased arm swings  Functional tests:  5 times sit to stand: 31 sec no HHA Functional gait assessment: 12/30- high fall risk Gait speed: 0.64m/s- Slow per age related norms, increased fall risk   Today's Treatment:  None   Patient Education:  Education details: Patient educated on evaluation findings, goals. Person educated: Patient Education  method: Explanation Education comprehension: verbalized understanding   Home Exercise Program: None issue don evaluation  Assessment: Clinical impression:  Patient is a 75 y.o. male who was seen today for physical therapy evaluation and treatment for low back pain and gait and balance disorder. Objective impairments include Abnormal gait, decreased balance, decreased endurance, decreased knowledge of condition, decreased mobility, decreased safety awareness, impaired flexibility, improper body mechanics, postural dysfunction and pain. These impairments are limiting patient from cleaning, community activity, driving, meal prep, laundry, medication management, yard work, shopping, personal finances and yard work. Personal factors including Age, Past/current experiences, Time since onset of injury/illness/exacerbation and 1-2  comorbidities: Hx of lumbar surgeries, hx of multiple falls, memory issues for compliance and safety awareness, pt lives alone are also affecting patient's functional outcome. Patient will benefit from skilled PT to address above impairments and improve overall function.  Rehab potential: Fair memory issues, chronic condition  Clinical decision making: Stable/uncomplicated  Evaluation complexity: Moderate   Goals: Goals reviewed with patient? Yes  SHORT TERM GOALS:  STG Name Target Date Goal status  1 Pt will be able to perform 5 sit to stands without HHA in under 25 seconds to improve functional strength. Comments:  01/09/21 INITIAL  2 Pt will demo FGA score to improve by at least 4 points to improve functional balance.  Comments: Eval = 12/30 01/09/21 INITIAL  3 Pt will be compliant with waiting for 5 seconds before he transition from one position to other to improve safety awareness and reduce fall risk  Comments:  01/09/21 INITIAL   LONG TERM GOALS:   LTG Name Target Date Goal status  1 Pt will demo normal gait speed of >0.55m/s without AD to improve functional  ambulation and reduce fall risk Comments:  02/06/21 INITIAL  2 Pt will demo at least 6 points improvement on FGA score to reduce fall risk Comments:  02/06/21 INITIAL  3 Pt will be able to perform 5x sit to stand in under 20 seconds without HHA to improve functional strength Comments:  02/06/21 INITIAL  4 Pt will report <3 of near falls or "stumbles" in one week, on average, to reduce fall risk and improve safety awareness Comments: Eval= pt reports 8-10 stumbles a week 02/06/21 INITIAL   PLAN: PT frequency: 2x/week  PT duration: 8 weeks  Planned Interventions: Therapeutic exercises, Therapeutic activity, Neuro Muscular re-education, Balance training, Gait training, Patient/Family education, Joint mobilization, Stair training, Orthotic/Fit training, Spinal mobilization, Cryotherapy, Moist heat and Traction  Plan for next session: Issue HEP  Kerrie Pleasure, PT 12/12/2020, 3:47 PM  [facility] [address] [place], [place], [address] Phone: [phone]   Fax:  [phone]  Patient name: Anup Philmore Anders Hohmann. MRN: [id number] DOB: [birth year]

## 2020-12-18 NOTE — Therapy (Signed)
Outpatient Physical Therapy Treatment Patient Name: Cody Hines. MRN: [id number] DOB:July 25, 1946, 75 y.o., male Today's Date: 12/19/2020   PT End of Session - 12/19/20 1320    Visit Number 2    Number of Visits 17    Date for PT Re-Evaluation 02/06/21    Authorization Type [employer] Medicare    Progress Note Due on Visit 10    PT Start Time 1231    PT Stop Time 1313    PT Time Calculation (min) 42 min    Equipment Utilized During Treatment Gait belt    Activity Tolerance Patient tolerated treatment well    Behavior During Therapy WFL for tasks assessed/performed          PCP: [name], MD Referring Provier: [name], MD  Referring Diagnosis:  S22.080S (ICD-10-CM) - Compression fracture of T12 vertebra, sequela  S06.9X9S (ICD-10-CM) - Traumatic brain injury with loss of consciousness, sequela (HCC)  R29.6 (ICD-10-CM) - Frequent falls  G31.84 (ICD-10-CM) - MCI (mild cognitive impairment) with memory loss  R26.89 (ICD-10-CM) - Multifactorial gait disorder  R26.89 (ICD-10-CM) - Shuffling gait   Visit Diagnosis: Muscle weakness (generalized)  Repeated falls  Chronic bilateral low back pain without sciatica  Past Medical History:  Diagnosis Date  . BPH (benign prostatic hyperplasia)   . Cancer (HCC)    nose, skin cancer  . Elevated hemoglobin A1c   . Fibromyalgia   . GERD (gastroesophageal reflux disease)   . Hyperlipidemia   . Hypertension   . Hypogonadism male   . IBS (irritable bowel syndrome)   . OSA (obstructive sleep apnea)    Past Surgical History:  Procedure Laterality Date  .  nasal smr np3  1985  . CATARACT EXTRACTION, BILATERAL Bilateral 2021   Dr. Katy Fitch, L 5/27, R 3/25  . KNEE ARTHROSCOPY Left 1999  . SKIN CANCER EXCISION  2020   nose  . SPINE SURGERY  2007   L5 S 1 Disk  . SPINE SURGERY  09/2019   Compression fracture stabilization by Dr. Arnoldo Morale  . VASECTOMY  1983   Patient Active Problem List   Diagnosis Date Noted  .  Small right kidney 04/05/2020  . Thoracic aorta atherosclerosis ([place]) 04/04/2020  . At moderate risk for fall 09/27/2019  . Compression fracture of T12 vertebra ([place]) 09/27/2019  . Hyperlipidemia, mixed 09/26/2019  . Personal history of skin cancer 09/26/2019  . CKD (chronic kidney disease) stage 3, GFR 30-59 ml/min (HCC) 03/23/2019  . Major depressive disorder, recurrent episode, moderate ([place]) 07/19/2018  . Chronic lumbar pain 03/12/2018  . Benign essential tremor 12/19/2016  . Encounter for Medicare annual wellness exam 08/10/2015  . Overweight (BMI 25.0-29.9) 08/10/2015  . Medication management 05/03/2014  . Vitamin D deficiency 05/03/2014  . Essential hypertension   . GERD (gastroesophageal reflux disease)   . Other abnormal glucose (prediabetes)   . IBS (irritable bowel syndrome)   . OSA on CPAP   . Fibromyalgia   . BPH (benign prostatic hyperplasia)   . Testosterone deficiency     SUBJECTIVE: Patient reports that waiting the few seconds between positional changes has seemed to help. No falls to report. No other new changes/complaints.   Pain: is patient experiencing pain?                VAS scale: 3/10              Pain location: Low Back  Pain description: intermittent, sharp and aching               Aggravating factors: getting out of the car; body turns              Relieving factors: rest.    OBJECTIVE:   Today's Treatment:  Compelted gait training around therapy gym x 245 ft, patient continued to demo decrease toe clearance of LLE with mild foot slap noted. PT educating on trial of foot up brace on LLE to promote improved toe clearance and safety to avoid falls. Unable to trial today during session due to patient wearing slip on shoes. PT educating on wearing lace up shoes to next session to allow for trial.    Completed all of the following exercises during session and established initial HEP. Patient demo increased challenge with vision removed and  with horizontal head turns > vertical. Educated patient to complete HEP in corner in home for improved safety. Handout provided.   Access Code: [email or web address] URL: [email or web address] Date: 12/19/2020 Prepared by: Baldomero Lamy  Exercises Romberg Stance with Eyes Closed - 1 x daily - 5 x weekly - 1 sets - 3 reps - 30 hold Romberg Stance with Head Rotation - 1 x daily - 5 x weekly - 2 sets - 10 reps Standing Romberg to 1/2 Tandem Stance - 1 x daily - 5 x weekly - 1 sets - 3 reps - 30 hold   Completed standing without support reaching for cones on ground to challenge balance (as this is how patient has fallen), completed x 6 reps with minimal imbalance noted, completed with close supervision.   Patient Education:  Education details: Patient educated on initial HEP (see patient instructions for details) Person educated: Patient Education method: Consulting civil engineer, Geneticist, molecular, Demonstration Education comprehension: Verbalized understanding, Returned Demonstration     Home Exercise Program: HEP Issued. Handout Provided (Medbridge Code: [email or web address])    Assessment: Clinical impression:  Today's skilled PT session focused on initial gait training with patient demonstrating decreased DF on LLE, unable to trial foot up brace due to patient wearing slip on tennis shoes, educated to wear lace up to next session. Rest of session spent establishing initial HEP focused on corner balance with patient tolerating well. Intermittent rest breaks needed due to fatigue. Will continue to progress toward all goals and continue to per POC.    Rehab potential: Fair memory issues, chronic condition   Clinical decision making: Stable/uncomplicated   Evaluation complexity: Moderate     Goals: Goals reviewed with patient? Yes   SHORT TERM GOALS:   STG Name Target Date Goal status  1 Pt will be able to perform 5 sit to stands without HHA in under 25 seconds to improve functional strength. Comments:   01/09/21 INITIAL  2 Pt will demo FGA score to improve by at least 4 points to improve functional balance.  Comments: Eval = 12/30 01/09/21 INITIAL  3 Pt will be compliant with waiting for 5 seconds before he transition from one position to other to improve safety awareness and reduce fall risk  Comments:  01/09/21 INITIAL    LONG TERM GOALS:    LTG Name Target Date Goal status  1 Pt will demo normal gait speed of >0.24m/s without AD to improve functional ambulation and reduce fall risk Comments:  02/06/21 INITIAL  2 Pt will demo at least 6 points improvement on FGA score to reduce fall risk Comments:  02/06/21 INITIAL  3 Pt will be able to perform  5x sit to stand in under 20 seconds without HHA to improve functional strength Comments:  02/06/21 INITIAL  4 Pt will report <3 of near falls or "stumbles" in one week, on average, to reduce fall risk and improve safety awareness Comments: Eval= pt reports 8-10 stumbles a week 02/06/21 INITIAL    PLAN: PT frequency: 2x/week   PT duration: 8 weeks   Planned Interventions: Therapeutic exercises, Therapeutic activity, Neuro Muscular re-education, Balance training, Gait training, Patient/Family education, Joint mobilization, Stair training, Orthotic/Fit training, Spinal mobilization, Cryotherapy, Moist heat and Traction   Plan for next session: Review HEP and address any questions/concerns. Continue therex/NMR focused on strenghtening/balance.     Jones Bales, PT, DPT 12/19/2020    [facility] [address] [place], [place], [address] Phone: [phone]   Fax:  [phone]  Patient name: Cody Hines. MRN: [id number] DOB: 1946-01-19

## 2020-12-19 ENCOUNTER — Other Ambulatory Visit: Payer: Self-pay

## 2020-12-19 ENCOUNTER — Ambulatory Visit (INDEPENDENT_AMBULATORY_CARE_PROVIDER_SITE_OTHER): Payer: Medicare Other | Admitting: Psychiatry

## 2020-12-19 ENCOUNTER — Ambulatory Visit: Payer: Medicare Other

## 2020-12-19 DIAGNOSIS — M545 Low back pain, unspecified: Secondary | ICD-10-CM | POA: Diagnosis not present

## 2020-12-19 DIAGNOSIS — F411 Generalized anxiety disorder: Secondary | ICD-10-CM | POA: Diagnosis not present

## 2020-12-19 DIAGNOSIS — M6281 Muscle weakness (generalized): Secondary | ICD-10-CM

## 2020-12-19 DIAGNOSIS — G8929 Other chronic pain: Secondary | ICD-10-CM

## 2020-12-19 DIAGNOSIS — R296 Repeated falls: Secondary | ICD-10-CM

## 2020-12-19 NOTE — Progress Notes (Signed)
[facility] Counselor/Therapist Progress Note  Patient ID: Cody Philmore Tracey Stewart., MRN: [id number],    Date: 12/19/2020   Time Spent:  50 minutes    8:00am to 8:50am  Treatment Type: Individual Therapy  Reported Symptoms: anxiety, some depression, some irritability  Mental Status Exam:  Appearance:   Casual     Behavior:  Appropriate and Sharing  Motor:  walks slower, some dragging of left foot, Is being seen by neurologist  Speech/Language:   Clear and Coherent  Affect:  anxious  Mood:  anxious and some depression  Thought process:  some tangentiality  Thought content:    some rumination  Sensory/Perceptual disturbances:    WNL  Orientation:  oriented to person, place, time/date, situation, day of week, month of year and year  Attention:  Fair  Concentration:  Fair  Memory:  some forgetting and reports worse under stress  Fund of knowledge:   Good  Insight:    Good and Fair  Judgment:   Good and Fair  Impulse Control:  Good   Risk Assessment: Danger to Self:  No Self-injurious Behavior: No Danger to Others: No Duty to Warn:no Physical Aggression / Violence:No  Access to Firearms a concern: No  Gang Involvement:No   Subjective: Patient today reporting anxiety as his main symptom, along with some depression.   Interventions: Solution-Oriented/Positive Psychology and Ego-Supportive  Diagnosis:   ICD-10-CM   1. Generalized anxiety disorder  F41.1      Plan: Patient is not signing tx goals on computer screen due to [place].  Treatment Goals: Goals remain on tx plan as patient works on strategies to achieve his goals. Progress is documented each session in the "Progress" section of Plan.   Long term goal: Develop healthy cognitive patterns and beliefs about self and the world that lead to alleviation and help prevent relapse of depression.  Short term goal: Identify and replace depressive thinking that leads to depressive feelings and  actions.He will replace the depressive thinking with more positive, hopeful thoughts that can lead to more positive and hopeful feelings.  Strategy: Patient to more consistently monitor his thoughts and catch the depressive/negative thoughts, trying to change them to positiveand more reality-based thought patterns.Look at his negative thoughts and weigh them against the evidence.  PROGRESS: Patient in today reporting anxiety as his stronger symptom, along with some depression. Anxious about "being around people, family concerns, sometimes feel like a 3rd wheel at places".  "Still depressed some about past marital issues, some health issues", and aging challenges. He denies any SI. Did not talk as much today about separated wife, but he did talk steadily throughout the session about his depression, anxiety, some prior marital issues, and health/aging concerns. Seems that he does have contact with his daughter and son and other family members, but some loneliness and lack of feeling heard at times.  Reports "my forgetfulness is still the same and I am making conscious effort to follow up and make notes to help me remember some things." Difficulty letting go of negative/anxious thoughts/feelings and tends to talk more about those things.  Can be re-directed to some more positive thoughts.  Patient reports falling again, this time in his driveway about 2 weeks ago, "and it left me sore and scuffed up but am better now and didn't break anything." Reviewed some safety tips with patient, supporting what his doctor has already told him (being careful when leaning over and then rising up gradually, watching his steps, refrain  from letting his large dog jump on him, using caution when driving).  Encouraged patient to remain in good contact with his adult son and daughter and some of his longtime friends that are supportive, to look for more positives daily, to get outside daily as he is able and as weather  permits, to continue using his cane which provides more stability for him, to remain on his medications as prescribed, to maintain his improved personal hygiene as that contributes to his overall health, and allowing his adult son to be more helpful in managing patient's affairs as this seems helpful to patient at this point with more aging issues he is experiencing.  Goal review and progress/challenges noted with patient.  Next appt within 3-4 weeks.   Shanon Ace, LCSW

## 2020-12-19 NOTE — Patient Instructions (Signed)
Access Code: [email or web address] URL: [email or web address] Date: 12/19/2020 Prepared by: Baldomero Lamy  Exercises Romberg Stance with Eyes Closed - 1 x daily - 5 x weekly - 1 sets - 3 reps - 30 hold Romberg Stance with Head Rotation - 1 x daily - 5 x weekly - 2 sets - 10 reps Standing Romberg to 1/2 Tandem Stance - 1 x daily - 5 x weekly - 1 sets - 3 reps - 30 hold

## 2020-12-21 ENCOUNTER — Other Ambulatory Visit: Payer: Self-pay

## 2020-12-21 ENCOUNTER — Ambulatory Visit: Payer: Medicare Other

## 2020-12-21 DIAGNOSIS — M545 Low back pain, unspecified: Secondary | ICD-10-CM | POA: Diagnosis not present

## 2020-12-21 DIAGNOSIS — M6281 Muscle weakness (generalized): Secondary | ICD-10-CM

## 2020-12-21 DIAGNOSIS — R296 Repeated falls: Secondary | ICD-10-CM

## 2020-12-21 DIAGNOSIS — Z7409 Other reduced mobility: Secondary | ICD-10-CM

## 2020-12-21 NOTE — Therapy (Signed)
Outpatient Physical Therapy Treatment Patient Name: Cody Hines. MRN: [id number] DOB:[birth year], 75 y.o., male Today's Date: 12/21/2020   PT End of Session - 12/21/20 1318    Visit Number 3    Number of Visits 17    Date for PT Re-Evaluation 02/06/21    Authorization Type [employer] Medicare    Progress Note Due on Visit 10    PT Start Time 1245    PT Stop Time 1315    PT Time Calculation (min) 30 min    Equipment Utilized During Treatment Gait belt    Activity Tolerance Patient tolerated treatment well    Behavior During Therapy WFL for tasks assessed/performed          PCP: [name], MD Referring Provier: [name], MD  Referring Diagnosis:  S22.080S (ICD-10-CM) - Compression fracture of T12 vertebra, sequela  S06.9X9S (ICD-10-CM) - Traumatic brain injury with loss of consciousness, sequela (HCC)  R29.6 (ICD-10-CM) - Frequent falls  G31.84 (ICD-10-CM) - MCI (mild cognitive impairment) with memory loss  R26.89 (ICD-10-CM) - Multifactorial gait disorder  R26.89 (ICD-10-CM) - Shuffling gait   Visit Diagnosis: Muscle weakness (generalized)  Repeated falls  Chronic bilateral low back pain without sciatica  Impaired functional mobility, balance, gait, and endurance  Past Medical History:  Diagnosis Date  . BPH (benign prostatic hyperplasia)   . Cancer (HCC)    nose, skin cancer  . Elevated hemoglobin A1c   . Fibromyalgia   . GERD (gastroesophageal reflux disease)   . Hyperlipidemia   . Hypertension   . Hypogonadism male   . IBS (irritable bowel syndrome)   . OSA (obstructive sleep apnea)    Past Surgical History:  Procedure Laterality Date  .  nasal smr np3  1985  . CATARACT EXTRACTION, BILATERAL Bilateral 2021   Dr. Katy Fitch, L 5/27, R 3/25  . KNEE ARTHROSCOPY Left 1999  . SKIN CANCER EXCISION  2020   nose  . SPINE SURGERY  2007   L5 S 1 Disk  . SPINE SURGERY  09/2019   Compression fracture stabilization by Dr. Arnoldo Morale  . VASECTOMY   1983   Patient Active Problem List   Diagnosis Date Noted  . Small right kidney 04/05/2020  . Thoracic aorta atherosclerosis ([place]) 04/04/2020  . At moderate risk for fall 09/27/2019  . Compression fracture of T12 vertebra ([place]) 09/27/2019  . Hyperlipidemia, mixed 09/26/2019  . Personal history of skin cancer 09/26/2019  . CKD (chronic kidney disease) stage 3, GFR 30-59 ml/min (HCC) 03/23/2019  . Major depressive disorder, recurrent episode, moderate ([place]) 07/19/2018  . Chronic lumbar pain 03/12/2018  . Benign essential tremor 12/19/2016  . Encounter for Medicare annual wellness exam 08/10/2015  . Overweight (BMI 25.0-29.9) 08/10/2015  . Medication management 05/03/2014  . Vitamin D deficiency 05/03/2014  . Essential hypertension   . GERD (gastroesophageal reflux disease)   . Other abnormal glucose (prediabetes)   . IBS (irritable bowel syndrome)   . OSA on CPAP   . Fibromyalgia   . BPH (benign prostatic hyperplasia)   . Testosterone deficiency     SUBJECTIVE: Pt reports that no new complaints. Back pain about same.  Pain: is patient experiencing pain?                VAS scale: 3/10              Pain location: Low Back              Pain description: intermittent, sharp and  aching               Aggravating factors: getting out of the car; body turns              Relieving factors: rest.    OBJECTIVE:   Today's Treatment:   12/21/20:  Reviewed HEP  Standing on floor with narrow BOS head turns: 20x  Standing on floor with narrow BOS: EC: 2 x 60"  Partial tandem stance with head turns: 10x R and L- progressed from HEP  Sit to stand: 15 lbs 2 x 5, worked on quick get ups  Gait training: 1 x 240' with cues to swing arms, fast walking, horizontal head turns.   Access Code: [email or web address] URL: [email or web address] Date: 12/19/2020 Prepared by: Baldomero Lamy  Exercises Romberg Stance with Eyes Closed - 1 x daily - 5 x weekly - 1 sets - 3 reps - 30 hold Romberg  Stance with Head Rotation - 1 x daily - 5 x weekly - 2 sets - 10 reps Standing Romberg to 1/2 Tandem Stance - 1 x daily - 5 x weekly - 1 sets - 3 reps - 30 hold   Completed standing without support reaching for cones on ground to challenge balance (as this is how patient has fallen), completed x 6 reps with minimal imbalance noted, completed with close supervision.   Patient Education:  Education details: Patient educated on initial HEP (see patient instructions for details) Person educated: Patient Education method: Consulting civil engineer, Geneticist, molecular, Demonstration Education comprehension: Verbalized understanding, Returned Demonstration     Home Exercise Program: HEP Issued. Handout Provided (Medbridge Code: [email or web address])    Assessment: Clinical impression:  Pt reports compliance with HEP. Sit to stand was improved as he was able to get up with one try even with 15lb weight in his hand. Pt did not report "spinning with horizontal head turns as long as speed of head turns was slower.   Rehab potential: Fair memory issues, chronic condition   Clinical decision making: Stable/uncomplicated   Evaluation complexity: Moderate     Goals: Goals reviewed with patient? Yes   SHORT TERM GOALS:   STG Name Target Date Goal status  1 Pt will be able to perform 5 sit to stands without HHA in under 25 seconds to improve functional strength. Comments:  01/09/21 INITIAL  2 Pt will demo FGA score to improve by at least 4 points to improve functional balance.  Comments: Eval = 12/30 01/09/21 INITIAL  3 Pt will be compliant with waiting for 5 seconds before he transition from one position to other to improve safety awareness and reduce fall risk  Comments:  01/09/21 INITIAL    LONG TERM GOALS:    LTG Name Target Date Goal status  1 Pt will demo normal gait speed of >0.73m/s without AD to improve functional ambulation and reduce fall risk Comments:  02/06/21 INITIAL  2 Pt will demo at least 6 points improvement on  FGA score to reduce fall risk Comments:  02/06/21 INITIAL  3 Pt will be able to perform 5x sit to stand in under 20 seconds without HHA to improve functional strength Comments:  02/06/21 INITIAL  4 Pt will report <3 of near falls or "stumbles" in one week, on average, to reduce fall risk and improve safety awareness Comments: Eval= pt reports 8-10 stumbles a week 02/06/21 INITIAL    PLAN: PT frequency: 2x/week   PT duration: 8 weeks   Planned Interventions: Therapeutic exercises, Therapeutic activity, Neuro Muscular re-education,  Balance training, Gait training, Patient/Family education, Joint mobilization, Stair training, Orthotic/Fit training, Spinal mobilization, Cryotherapy, Moist heat and Traction   Plan for next session: Review HEP and address any questions/concerns. Continue therex/NMR focused on strenghtening/balance.     Kerrie Pleasure, PT, DPT 12/21/2020, [facility] [address] [place], [place], [address] Phone: [phone]   Fax:  [phone]  Patient name: Cody Philmore Miroslav Gin. MRN: [id number] DOB: 1946-09-11

## 2020-12-26 ENCOUNTER — Ambulatory Visit: Payer: Medicare Other

## 2020-12-26 ENCOUNTER — Other Ambulatory Visit: Payer: Self-pay

## 2020-12-26 DIAGNOSIS — G8929 Other chronic pain: Secondary | ICD-10-CM

## 2020-12-26 DIAGNOSIS — M6281 Muscle weakness (generalized): Secondary | ICD-10-CM

## 2020-12-26 DIAGNOSIS — M545 Low back pain, unspecified: Secondary | ICD-10-CM | POA: Diagnosis not present

## 2020-12-26 DIAGNOSIS — R296 Repeated falls: Secondary | ICD-10-CM

## 2020-12-26 DIAGNOSIS — Z7409 Other reduced mobility: Secondary | ICD-10-CM

## 2020-12-26 NOTE — Therapy (Signed)
Outpatient Physical Therapy Treatment Patient Name: Cody Hines. MRN: [id number] DOB:[birth year], 75 y.o., male Today's Date: 12/26/2020   PT End of Session - 12/26/20 1406    Visit Number 4    Number of Visits 17    Date for PT Re-Evaluation 02/06/21    Authorization Type UHC Medicare    Progress Note Due on Visit 10    PT Start Time 1405    PT Stop Time 1450    PT Time Calculation (min) 45 min    Equipment Utilized During Treatment Gait belt    Activity Tolerance Patient tolerated treatment well    Behavior During Therapy WFL for tasks assessed/performed          PCP: [name], MD Referring Provier: Dohmeier, Asencion Partridge, MD  Referring Diagnosis:  S22.080S (ICD-10-CM) - Compression fracture of T12 vertebra, sequela  S06.9X9S (ICD-10-CM) - Traumatic brain injury with loss of consciousness, sequela (HCC)  R29.6 (ICD-10-CM) - Frequent falls  G31.84 (ICD-10-CM) - MCI (mild cognitive impairment) with memory loss  R26.89 (ICD-10-CM) - Multifactorial gait disorder  R26.89 (ICD-10-CM) - Shuffling gait   Visit Diagnosis: Muscle weakness (generalized)  Repeated falls  Chronic bilateral low back pain without sciatica  Impaired functional mobility, balance, gait, and endurance  Past Medical History:  Diagnosis Date  . BPH (benign prostatic hyperplasia)   . Cancer (HCC)    nose, skin cancer  . Elevated hemoglobin A1c   . Fibromyalgia   . GERD (gastroesophageal reflux disease)   . Hyperlipidemia   . Hypertension   . Hypogonadism male   . IBS (irritable bowel syndrome)   . OSA (obstructive sleep apnea)    Past Surgical History:  Procedure Laterality Date  .  nasal smr np3  1985  . CATARACT EXTRACTION, BILATERAL Bilateral 2021   Dr. Katy Fitch, L 5/27, R 3/25  . KNEE ARTHROSCOPY Left 1999  . SKIN CANCER EXCISION  2020   nose  . SPINE SURGERY  2007   L5 S 1 Disk  . SPINE SURGERY  09/2019   Compression fracture stabilization by Dr. Arnoldo Morale  . VASECTOMY   1983   Patient Active Problem List   Diagnosis Date Noted  . Small right kidney 04/05/2020  . Thoracic aorta atherosclerosis ([place]) 04/04/2020  . At moderate risk for fall 09/27/2019  . Compression fracture of T12 vertebra ([place]) 09/27/2019  . Hyperlipidemia, mixed 09/26/2019  . Personal history of skin cancer 09/26/2019  . CKD (chronic kidney disease) stage 3, GFR 30-59 ml/min (HCC) 03/23/2019  . Major depressive disorder, recurrent episode, moderate ([place]) 07/19/2018  . Chronic lumbar pain 03/12/2018  . Benign essential tremor 12/19/2016  . Encounter for Medicare annual wellness exam 08/10/2015  . Overweight (BMI 25.0-29.9) 08/10/2015  . Medication management 05/03/2014  . Vitamin D deficiency 05/03/2014  . Essential hypertension   . GERD (gastroesophageal reflux disease)   . Other abnormal glucose (prediabetes)   . IBS (irritable bowel syndrome)   . OSA on CPAP   . Fibromyalgia   . BPH (benign prostatic hyperplasia)   . Testosterone deficiency     SUBJECTIVE: I over did it yesterday with few projects around the house yesterday. My back is really sore.  Pain: is patient experiencing pain?                VAS scale: 6/10              Pain location: Low Back  Pain description: intermittent, sharp and aching               Aggravating factors: getting out of the car; body turns              Relieving factors: rest.    OBJECTIVE:   Today's Treatment:  12/26/20:  Lower trunk rotations: 15x  Single knee to chest: 10 x 5" holds R and L  Put on Foot up braces in L shoe and walked 230' with SBA and put it in bil shoes and walked 230' cues for arm swings.  Sit to stand: 15 lbs 2 x 5  Standing on foam with narrow BOS head turns: 10x  Standing on foam with narrow BOS: EC: 2 x 60"  Standing on foam with narrow BOS: EO: 60"  Walking bwd with black elastic sport cord: 5-6 steps: 10x with 1 HHA of PT  12/21/20:  Reviewed HEP  Standing on floor with narrow BOS head turns:  20x  Standing on floor with narrow BOS: EC: 2 x 60"  Partial tandem stance with head turns: 10x R and L- progressed from HEP  Sit to stand: 15 lbs 2 x 5, worked on quick get ups  Gait training: 1 x 240' with cues to swing arms, fast walking, horizontal head turns.   HOME EXERCISE PROGRAM:   Access Code: [email or web address] URL: [email or web address] Date: 12/19/2020 Prepared by: Baldomero Lamy  Exercises Romberg Stance with Eyes Closed - 1 x daily - 5 x weekly - 1 sets - 3 reps - 30 hold Romberg Stance with Head Rotation - 1 x daily - 5 x weekly - 2 sets - 10 reps Standing Romberg to 1/2 Tandem Stance - 1 x daily - 5 x weekly - 1 sets - 3 reps - 30 hold  Access Code: [email or web address] URL: [email or web address] Date: 12/26/2020 Prepared by: Markus Jarvis  Exercises Supine Lower Trunk Rotation - 1 x daily - 7 x weekly - 2 sets - 10 reps Hooklying Single Knee to Chest Stretch - 1 x daily - 7 x weekly - 10 reps - 5 sec hold   Completed standing without support reaching for cones on ground to challenge balance (as this is how patient has fallen), completed x 6 reps with minimal imbalance noted, completed with close supervision.   Patient Education:  Education details: Patient educated on initial HEP (see patient instructions for details) Person educated: Patient Education method: Consulting civil engineer, Geneticist, molecular, Demonstration Education comprehension: Verbalized understanding, Returned Demonstration     Home Exercise Program: HEP Issued. Handout Provided (Medbridge Code: [email or web address])    Assessment: Clinical impression:  Pt demonstrated improved ankle DF with foot up brace. Foot braces were removed with resisted bwd walking. Pt demonstrated short term improvement in improved ankle DF as he was walking out of therapy today. Needed decreasing cues for arm swings compared to previous session.   Rehab potential: Fair memory issues, chronic condition   Clinical decision making:  Stable/uncomplicated   Evaluation complexity: Moderate     Goals: Goals reviewed with patient? Yes   SHORT TERM GOALS:   STG Name Target Date Goal status  1 Pt will be able to perform 5 sit to stands without HHA in under 25 seconds to improve functional strength. Comments:  01/09/21 INITIAL  2 Pt will demo FGA score to improve by at least 4 points to improve functional balance.  Comments: Eval = 12/30 01/09/21 INITIAL  3 Pt will be compliant with waiting for 5 seconds before he transition  from one position to other to improve safety awareness and reduce fall risk  Comments:  01/09/21 INITIAL    LONG TERM GOALS:    LTG Name Target Date Goal status  1 Pt will demo normal gait speed of >0.85m/s without AD to improve functional ambulation and reduce fall risk Comments:  02/06/21 INITIAL  2 Pt will demo at least 6 points improvement on FGA score to reduce fall risk Comments:  02/06/21 INITIAL  3 Pt will be able to perform 5x sit to stand in under 20 seconds without HHA to improve functional strength Comments:  02/06/21 INITIAL  4 Pt will report <3 of near falls or "stumbles" in one week, on average, to reduce fall risk and improve safety awareness Comments: Eval= pt reports 8-10 stumbles a week 02/06/21 INITIAL    PLAN: PT frequency: 2x/week   PT duration: 8 weeks   Planned Interventions: Therapeutic exercises, Therapeutic activity, Neuro Muscular re-education, Balance training, Gait training, Patient/Family education, Joint mobilization, Stair training, Orthotic/Fit training, Spinal mobilization, Cryotherapy, Moist heat and Traction   Plan for next session: Work on walking bwd, progress balance and strengthening   Kerrie Pleasure, PT, DPT 12/26/2020, [facility] [address] [place], [place], [address] Phone: [phone]   Fax:  [phone]  Patient name: Cody Hines. MRN: [id number] DOB:  12-14-1945

## 2020-12-28 ENCOUNTER — Ambulatory Visit: Payer: Medicare Other

## 2021-01-01 ENCOUNTER — Other Ambulatory Visit: Payer: Self-pay | Admitting: Internal Medicine

## 2021-01-02 ENCOUNTER — Ambulatory Visit: Payer: Medicare Other

## 2021-01-02 ENCOUNTER — Other Ambulatory Visit: Payer: Self-pay

## 2021-01-02 DIAGNOSIS — Z7409 Other reduced mobility: Secondary | ICD-10-CM

## 2021-01-02 DIAGNOSIS — M545 Low back pain, unspecified: Secondary | ICD-10-CM | POA: Diagnosis not present

## 2021-01-02 DIAGNOSIS — M6281 Muscle weakness (generalized): Secondary | ICD-10-CM

## 2021-01-02 DIAGNOSIS — R296 Repeated falls: Secondary | ICD-10-CM

## 2021-01-02 NOTE — Therapy (Signed)
Outpatient Physical Therapy Treatment Patient Name: Cody Hines. MRN: [id number] DOB:03-08-1946, 75 y.o., male Today's Date: 01/02/2021   PT End of Session - 01/02/21 1239    Visit Number 5    Number of Visits 17    Date for PT Re-Evaluation 02/06/21    Authorization Type UHC Medicare    Progress Note Due on Visit 10    PT Start Time 5573    PT Stop Time 1315    PT Time Calculation (min) 40 min    Equipment Utilized During Treatment Gait belt    Activity Tolerance Patient tolerated treatment well    Behavior During Therapy WFL for tasks assessed/performed          PCP: [name], MD Referring Provier: Dohmeier, Asencion Partridge, MD  Referring Diagnosis:  S22.080S (ICD-10-CM) - Compression fracture of T12 vertebra, sequela  S06.9X9S (ICD-10-CM) - Traumatic brain injury with loss of consciousness, sequela (HCC)  R29.6 (ICD-10-CM) - Frequent falls  G31.84 (ICD-10-CM) - MCI (mild cognitive impairment) with memory loss  R26.89 (ICD-10-CM) - Multifactorial gait disorder  R26.89 (ICD-10-CM) - Shuffling gait   Visit Diagnosis: Muscle weakness (generalized)  Repeated falls  Chronic bilateral low back pain without sciatica  Impaired functional mobility, balance, gait, and endurance  Past Medical History:  Diagnosis Date  . BPH (benign prostatic hyperplasia)   . Cancer (HCC)    nose, skin cancer  . Elevated hemoglobin A1c   . Fibromyalgia   . GERD (gastroesophageal reflux disease)   . Hyperlipidemia   . Hypertension   . Hypogonadism male   . IBS (irritable bowel syndrome)   . OSA (obstructive sleep apnea)    Past Surgical History:  Procedure Laterality Date  .  nasal smr np3  1985  . CATARACT EXTRACTION, BILATERAL Bilateral 2021   Dr. Katy Fitch, L 5/27, R 3/25  . KNEE ARTHROSCOPY Left 1999  . SKIN CANCER EXCISION  2020   nose  . SPINE SURGERY  2007   L5 S 1 Disk  . SPINE SURGERY  09/2019   Compression fracture stabilization by Dr. Arnoldo Morale  . VASECTOMY   1983   Patient Active Problem List   Diagnosis Date Noted  . Small right kidney 04/05/2020  . Thoracic aorta atherosclerosis ([place]) 04/04/2020  . At moderate risk for fall 09/27/2019  . Compression fracture of T12 vertebra ([place]) 09/27/2019  . Hyperlipidemia, mixed 09/26/2019  . Personal history of skin cancer 09/26/2019  . CKD (chronic kidney disease) stage 3, GFR 30-59 ml/min (HCC) 03/23/2019  . Major depressive disorder, recurrent episode, moderate ([place]) 07/19/2018  . Chronic lumbar pain 03/12/2018  . Benign essential tremor 12/19/2016  . Encounter for Medicare annual wellness exam 08/10/2015  . Overweight (BMI 25.0-29.9) 08/10/2015  . Medication management 05/03/2014  . Vitamin D deficiency 05/03/2014  . Essential hypertension   . GERD (gastroesophageal reflux disease)   . Other abnormal glucose (prediabetes)   . IBS (irritable bowel syndrome)   . OSA on CPAP   . Fibromyalgia   . BPH (benign prostatic hyperplasia)   . Testosterone deficiency     SUBJECTIVE: pt reports he feels that he is getting out of the bed easier. He had near fall while picking something up from floor in the shop.  Pain: is patient experiencing pain?                VAS scale: 4/10              Pain location: Low Back  Pain description: intermittent, sharp and aching               Aggravating factors: getting out of the car; body turns              Relieving factors: rest.    OBJECTIVE:   Today's Treatment:   01/02/21  Pt educated on always keeping one hand on support surface and one hand to pick up object to improve safety  awareness.  Sit to stand: 15lbs 2 x 10   Standing deadlift: 15lb KB from 6" box: 2 x 5  Walking bwd with elastic sport cord: 10 feet, 5x, cues for toe to heel bwd and heel to toe fwd  Foam roll massage to L quads in supine  Walking bwd with elastic cord: 10 feet, 5x- 2nd trial     12/26/20:  Lower trunk rotations: 15x  Single knee to chest: 10 x 5" holds R and  L  Put on Foot up braces in L shoe and walked 230' with SBA and put it in bil shoes and walked 230' cues for arm swings.  Sit to stand: 15 lbs 2 x 5  Standing on foam with narrow BOS head turns: 10x  Standing on foam with narrow BOS: EC: 2 x 60"  Standing on foam with narrow BOS: EO: 60"  Walking bwd with black elastic sport cord: 5-6 steps: 10x with 1 HHA of PT  12/21/20:  Reviewed HEP  Standing on floor with narrow BOS head turns: 20x  Standing on floor with narrow BOS: EC: 2 x 60"  Partial tandem stance with head turns: 10x R and L- progressed from HEP  Sit to stand: 15 lbs 2 x 5, worked on quick get ups  Gait training: 1 x 240' with cues to swing arms, fast walking, horizontal head turns.   HOME EXERCISE PROGRAM:   Access Code: [email or web address] URL: [email or web address] Date: 12/19/2020 Prepared by: Baldomero Lamy  Exercises Romberg Stance with Eyes Closed - 1 x daily - 5 x weekly - 1 sets - 3 reps - 30 hold Romberg Stance with Head Rotation - 1 x daily - 5 x weekly - 2 sets - 10 reps Standing Romberg to 1/2 Tandem Stance - 1 x daily - 5 x weekly - 1 sets - 3 reps - 30 hold  Access Code: [email or web address] URL: [email or web address] Date: 12/26/2020 Prepared by: Markus Jarvis  Exercises Supine Lower Trunk Rotation - 1 x daily - 7 x weekly - 2 sets - 10 reps Hooklying Single Knee to Chest Stretch - 1 x daily - 7 x weekly - 10 reps - 5 sec hold   Completed standing without support reaching for cones on ground to challenge balance (as this is how patient has fallen), completed x 6 reps with minimal imbalance noted, completed with close supervision.   Patient Education:  Education details: Patient educated on initial HEP (see patient instructions for details) Person educated: Patient Education method: Consulting civil engineer, Geneticist, molecular, Demonstration Education comprehension: Verbalized understanding, Returned Demonstration     Home Exercise Program: HEP Issued. Handout  Provided (Medbridge Code: [email or web address])    Assessment: Clinical impression:  Pt continues to report L knee pain and intermittent buckling with exercises. He tolerated progression of exercises well. Pt reported improved stability in L knee after foam roll massage to L quad.    Rehab potential: Fair memory issues, chronic condition   Clinical decision making: Stable/uncomplicated   Evaluation complexity: Moderate     Goals: Goals reviewed with patient?  Yes   SHORT TERM GOALS:   STG Name Target Date Goal status  1 Pt will be able to perform 5 sit to stands without HHA in under 25 seconds to improve functional strength. Comments:  01/09/21 INITIAL  2 Pt will demo FGA score to improve by at least 4 points to improve functional balance.  Comments: Eval = 12/30 01/09/21 INITIAL  3 Pt will be compliant with waiting for 5 seconds before he transition from one position to other to improve safety awareness and reduce fall risk  Comments:  01/09/21 INITIAL    LONG TERM GOALS:    LTG Name Target Date Goal status  1 Pt will demo normal gait speed of >0.35m/s without AD to improve functional ambulation and reduce fall risk Comments:  02/06/21 INITIAL  2 Pt will demo at least 6 points improvement on FGA score to reduce fall risk Comments:  02/06/21 INITIAL  3 Pt will be able to perform 5x sit to stand in under 20 seconds without HHA to improve functional strength Comments:  02/06/21 INITIAL  4 Pt will report <3 of near falls or "stumbles" in one week, on average, to reduce fall risk and improve safety awareness Comments: Eval= pt reports 8-10 stumbles a week 02/06/21 INITIAL    PLAN: PT frequency: 2x/week   PT duration: 8 weeks   Planned Interventions: Therapeutic exercises, Therapeutic activity, Neuro Muscular re-education, Balance training, Gait training, Patient/Family education, Joint mobilization, Stair training, Orthotic/Fit training, Spinal mobilization, Cryotherapy, Moist heat and Traction   Plan  for next session: Work on walking bwd, progress balance and strengthening   Kerrie Pleasure, PT, DPT 01/02/2021, [facility] [address] [place], [place], [address] Phone: [phone]   Fax:  [phone]  Patient name: Cody Hines. MRN: [id number] DOB: 01-04-1946

## 2021-01-04 ENCOUNTER — Ambulatory Visit: Payer: Medicare Other | Attending: Neurology

## 2021-01-04 DIAGNOSIS — Z7409 Other reduced mobility: Secondary | ICD-10-CM | POA: Insufficient documentation

## 2021-01-04 DIAGNOSIS — M545 Low back pain, unspecified: Secondary | ICD-10-CM | POA: Insufficient documentation

## 2021-01-04 DIAGNOSIS — M6281 Muscle weakness (generalized): Secondary | ICD-10-CM | POA: Insufficient documentation

## 2021-01-04 DIAGNOSIS — G8929 Other chronic pain: Secondary | ICD-10-CM | POA: Diagnosis present

## 2021-01-04 DIAGNOSIS — R296 Repeated falls: Secondary | ICD-10-CM | POA: Insufficient documentation

## 2021-01-04 NOTE — Therapy (Signed)
Outpatient Physical Therapy Treatment Patient Name: Cody Hines. MRN: [id number] DOB:10-22-1945, 75 y.o., male Today's Date: 01/04/2021   PT End of Session - 01/04/21 1122    Visit Number 6    Number of Visits 17    Date for PT Re-Evaluation 02/06/21    Authorization Type [employer] Medicare    Progress Note Due on Visit 10    PT Start Time 1118    PT Stop Time 1148    PT Time Calculation (min) 30 min    Equipment Utilized During Treatment Gait belt    Activity Tolerance Patient tolerated treatment well    Behavior During Therapy WFL for tasks assessed/performed          PCP: [name], MD Referring Provier: Dohmeier, Asencion Partridge, MD  Referring Diagnosis:  S22.080S (ICD-10-CM) - Compression fracture of T12 vertebra, sequela  S06.9X9S (ICD-10-CM) - Traumatic brain injury with loss of consciousness, sequela (HCC)  R29.6 (ICD-10-CM) - Frequent falls  G31.84 (ICD-10-CM) - MCI (mild cognitive impairment) with memory loss  R26.89 (ICD-10-CM) - Multifactorial gait disorder  R26.89 (ICD-10-CM) - Shuffling gait   Visit Diagnosis: Muscle weakness (generalized)  Repeated falls  Chronic bilateral low back pain without sciatica  Impaired functional mobility, balance, gait, and endurance  Past Medical History:  Diagnosis Date  . BPH (benign prostatic hyperplasia)   . Cancer (HCC)    nose, skin cancer  . Elevated hemoglobin A1c   . Fibromyalgia   . GERD (gastroesophageal reflux disease)   . Hyperlipidemia   . Hypertension   . Hypogonadism male   . IBS (irritable bowel syndrome)   . OSA (obstructive sleep apnea)    Past Surgical History:  Procedure Laterality Date  .  nasal smr np3  1985  . CATARACT EXTRACTION, BILATERAL Bilateral 2021   Dr. Katy Fitch, L 5/27, R 3/25  . KNEE ARTHROSCOPY Left 1999  . SKIN CANCER EXCISION  2020   nose  . SPINE SURGERY  2007   L5 S 1 Disk  . SPINE SURGERY  09/2019   Compression fracture stabilization by Dr. Arnoldo Morale  . VASECTOMY   1983   Patient Active Problem List   Diagnosis Date Noted  . Small right kidney 04/05/2020  . Thoracic aorta atherosclerosis ([place]) 04/04/2020  . At moderate risk for fall 09/27/2019  . Compression fracture of T12 vertebra ([place]) 09/27/2019  . Hyperlipidemia, mixed 09/26/2019  . Personal history of skin cancer 09/26/2019  . CKD (chronic kidney disease) stage 3, GFR 30-59 ml/min (HCC) 03/23/2019  . Major depressive disorder, recurrent episode, moderate ([place]) 07/19/2018  . Chronic lumbar pain 03/12/2018  . Benign essential tremor 12/19/2016  . Encounter for Medicare annual wellness exam 08/10/2015  . Overweight (BMI 25.0-29.9) 08/10/2015  . Medication management 05/03/2014  . Vitamin D deficiency 05/03/2014  . Essential hypertension   . GERD (gastroesophageal reflux disease)   . Other abnormal glucose (prediabetes)   . IBS (irritable bowel syndrome)   . OSA on CPAP   . Fibromyalgia   . BPH (benign prostatic hyperplasia)   . Testosterone deficiency     SUBJECTIVE: My body doesn't do too well when weather is up and down like this."I was trying to walk backward with my eyes closed and I had near fall"  Pain: is patient experiencing pain?                VAS scale: 4/10              Pain location: Low Back  Pain description: intermittent, sharp and aching               Aggravating factors: getting out of the car; body turns              Relieving factors: rest.    OBJECTIVE:   Today's Treatment:  01/04/21:  Standing heel raises: from 1/2 foam roll: 2 x 10  Standing dead lifts: 15lbs KB from 6" box: 2 x 10  Walking bwd with elastic sport cord: 10 feet x 10 bwd; 10 feet x 8 fwd, occasional min A required, mostly CGA, cues for toe to heel with bwd walking and heel to toe for forward walking  01/02/21  Pt educated on always keeping one hand on support surface and one hand to pick up object to improve safety  awareness.  Sit to stand: 15lbs 2 x 10   Standing deadlift: 15lb  KB from 6" box: 2 x 5  Walking bwd with elastic sport cord: 10 feet, 5x, cues for toe to heel bwd and heel to toe fwd  Foam roll massage to L quads in supine  Walking bwd with elastic cord: 10 feet, 5x- 2nd trial   HOME EXERCISE PROGRAM:   Access Code: [email or web address] URL: [email or web address] Date: 12/19/2020 Prepared by: Baldomero Lamy  Exercises Romberg Stance with Eyes Closed - 1 x daily - 5 x weekly - 1 sets - 3 reps - 30 hold Romberg Stance with Head Rotation - 1 x daily - 5 x weekly - 2 sets - 10 reps Standing Romberg to 1/2 Tandem Stance - 1 x daily - 5 x weekly - 1 sets - 3 reps - 30 hold  Access Code: [email or web address] URL: [email or web address] Date: 12/26/2020 Prepared by: Markus Jarvis  Exercises Supine Lower Trunk Rotation - 1 x daily - 7 x weekly - 2 sets - 10 reps Hooklying Single Knee to Chest Stretch - 1 x daily - 7 x weekly - 10 reps - 5 sec hold   Completed standing without support reaching for cones on ground to challenge balance (as this is how patient has fallen), completed x 6 reps with minimal imbalance noted, completed with close supervision.   Patient Education:  Education details: Pt educated to not close eyes and walk backwards at home as that is not part of his home instructions. It is not safe for him to perform everything that we do at home without supervision of skilled person Person educated: Patient Education method: Explanation, Handout, Demonstration Education comprehension: Verbalized understanding, Returned Demonstration     Home Exercise Program: HEP Issued. Handout Provided (Medbridge Code: [email or web address])    Assessment: Clinical impression:  Pt demonstrated improved tolerance to bending and lifting 15lb compared to last time. Patient demonstrated improved stepping fwd and bwd with resistance. Pt is reporting of decreased frequency of near falls at home. But due to memory issues, his safety awareness is impacted.   Rehab  potential: Fair memory issues, chronic condition   Clinical decision making: Stable/uncomplicated   Evaluation complexity: Moderate     Goals: Goals reviewed with patient? Yes   SHORT TERM GOALS:   STG Name Target Date Goal status  1 Pt will be able to perform 5 sit to stands without HHA in under 25 seconds to improve functional strength. Comments:  01/09/21 INITIAL  2 Pt will demo FGA score to improve by at least 4 points to improve functional balance.  Comments: Eval = 12/30 01/09/21 INITIAL  3 Pt will be compliant with waiting  for 5 seconds before he transition from one position to other to improve safety awareness and reduce fall risk  Comments:  01/09/21 INITIAL    LONG TERM GOALS:    LTG Name Target Date Goal status  1 Pt will demo normal gait speed of >0.42m/s without AD to improve functional ambulation and reduce fall risk Comments:  02/06/21 INITIAL  2 Pt will demo at least 6 points improvement on FGA score to reduce fall risk Comments:  02/06/21 INITIAL  3 Pt will be able to perform 5x sit to stand in under 20 seconds without HHA to improve functional strength Comments:  02/06/21 INITIAL  4 Pt will report <3 of near falls or "stumbles" in one week, on average, to reduce fall risk and improve safety awareness Comments: Eval= pt reports 8-10 stumbles a week 02/06/21 INITIAL    PLAN: PT frequency: 2x/week   PT duration: 8 weeks   Planned Interventions: Therapeutic exercises, Therapeutic activity, Neuro Muscular re-education, Balance training, Gait training, Patient/Family education, Joint mobilization, Stair training, Orthotic/Fit training, Spinal mobilization, Cryotherapy, Moist heat and Traction   Plan for next session: Work on walking bwd, progress balance and strengthening   Kerrie Pleasure, PT, DPT 01/04/2021, [place] [address] [place], [place], [address] Phone: [phone]   Fax:   [phone]  Patient name: Cody Hines. MRN: [id number] DOB: [birth year]

## 2021-01-08 ENCOUNTER — Encounter: Payer: Self-pay | Admitting: Internal Medicine

## 2021-01-08 NOTE — Patient Instructions (Signed)

## 2021-01-08 NOTE — Progress Notes (Signed)
Comprehensive Evaluation & Examination   Future Appointments  Date Time Provider [place]  01/09/2021  9:00 AM [name], MD [facility] None  03/28/2021  9:00 AM Ward Givens, NP [facility] None  09/26/2021  9:00 AM Liane Comber, NP [facility] None  11/14/2021  9:30 AM Ward Givens, NP [facility] None  01/09/2022 10:00 AM [name], MD [facility] None                                           This very nice 75 y.o.  separated WM presents for a  comprehensive evaluation and management of multiple medical co-morbidities.  Patient has been followed for HTN, OSA/CPAP, HLD, Pre-Diabetes and Vitamin D Deficiency Patient report improived sleep hygiene on CPAP.      Patient report ongoing psych counseling with Hassan Rowan & Dr Clovis Pu at [place] [facility] his marital separation and his depression. Patient also relates receiving  LPT for gait & balance per Dr Brett Fairy.      HTN predates circa 1993. Patient's BP has been controlled at home. 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  Today's BP is at goal - 116/62. Patient denies any cardiac symptoms as chest pain, palpitations, shortness of breath, dizziness or ankle swelling.      Patient's hyperlipidemia is controlled with diet & Rosuvastatin. Patient denies myalgias or other medication SE's. Last lipids were at goal:  Lab Results  Component Value Date   CHOL 126 07/12/2020   HDL 53 07/12/2020   LDLCALC Cody 07/12/2020   TRIG 76 07/12/2020   CHOLHDL 2.4 07/12/2020        Patient has hx/o prediabetes (A1c 6.0% /2011)and patient denies reactive hypoglycemic symptoms, visual blurring, diabetic polys or paresthesias. Last A1c was normal & at goal:    Lab Results  Component Value Date   HGBA1C 5.6 07/12/2020         Finally, patient has history of Vitamin D Deficiency ("29" /2008) and last vitamin D was at goal:    Lab Results  Component Value Date   VD25OH 61 07/12/2020    Current  Outpatient Medications on File Prior to Visit  Medication Sig  . acetaminophen  650 MG CR tablet Take 650 mg  every 8  hours as needed for pain   . aspirin EC 81 MG tablet Take  daily.  Marland Kitchen VITAMIN D 10,000 u Take  daily.  . enalapril  20 MG tablet Take  1 tablet Daily   . FLAX SEED OIL 1000 MG  Take  daily.   Marland Kitchen LORazepam 1 MG  Take 1 tablet  at bedtime as needed   . Magnesium 500 MG Take  daily.   . rosuvastatin 40 MG tablet Take  1 tablet  Daily    . sertraline  100 MG tablet Take 2 tablets  daily.    Allergies  Allergen Reactions  . Asa [Aspirin] Diarrhea, Nausea Only and Other (See Comments)    History of ulcers  . Atorvastatin Muscle aches  . Celebrex [Celecoxib] Headaches  . Codeine Nausea And Vomiting  . Elwyn Reach ] Other  . Viagra [Sildenafil] OHeadaches  . Penicillins Rash     Past Medical History:  Diagnosis Date  . BPH (benign prostatic hyperplasia)   . Cancer (HCC)    nose, skin cancer  . Elevated hemoglobin A1c   . Fibromyalgia   .  GERD (gastroesophageal reflux disease)   . Hyperlipidemia   . Hypertension   . Hypogonadism male   . IBS (irritable bowel syndrome)   . OSA (obstructive sleep apnea)     Health Maintenance  Topic Date Due  . COVID-19 Vaccine (3 - Booster for Pfizer series) 06/12/2020  . TETANUS/TDAP  09/26/2021 (Originally 01/14/2020)  . INFLUENZA VACCINE  05/06/2021  . COLONOSCOPY (Pts 45-89yrs Insurance coverage will need to be confirmed)  04/23/2023  . Hepatitis C Screening  Completed  . PNA vac Low Risk Adult  Completed  . HPV VACCINES  Aged Out    Immunization History  Administered Date(s) Administered  . PFIZER  SARS-COV-2 Vacc 11/19/2019, 12/11/2019  . Pneumococcal Conjugate -13 09/17/2018  . Pneumococcal Polysaccharide -23 09/26/2020  . Pneumococcal-Unspecified 07/16/2011  . Tdap 01/13/2010  . Zoster 08/09/2013    Last Colon - 04/22/2013 - Dor[facility] recommended  5 yr f/u  - overdue  Past Surgical History:   Procedure Laterality Date  .  nasal smr np3  1985  . CATARACT EXTRACTION, BILATERAL Bilateral 2021   Dr. Katy Fitch, L 5/27, R 3/25  . KNEE ARTHROSCOPY Left 1999  . SKIN CANCER EXCISION  2020   nose  . SPINE SURGERY  2007   L5 S 1 Disk  . SPINE SURGERY  09/2019   Compression fracture stabilization by Dr. Arnoldo Morale  . VASECTOMY  1983    Family History  Problem Relation Age of Onset  . Cancer Mother        breast  . Cancer Father        lung  . Diabetes Father   . Heart disease Sister   . Arthritis Sister     Social History   Socioeconomic History  . Marital status: Divorced  .    Occupational History  . 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 Smoking status: Former Smoker    Years: 30.00    Types: Cigarettes    Quit date: 10/06/1980    Years since quitting: 40.2  . Smokeless tobacco: Never Used  Vaping Use  . Vaping Use: Never used  Substance and Sexual Activity  . Alcohol use: No    Comment: Not drinking x 1 month  . Drug use: No  . Sexual activity: Not Currently    ROS Constitutional: Denies fever, chills, weight loss/gain, headaches, insomnia,  night sweats or change in appetite. Does c/o fatigue. Eyes: Denies redness, blurred vision, diplopia, discharge, itchy or watery eyes.  ENT: Denies discharge, congestion, post nasal drip, epistaxis, sore throat, earache, hearing loss, dental pain, Tinnitus, Vertigo, Sinus pain or snoring.  Cardio: Denies chest pain, palpitations, irregular heartbeat, syncope, dyspnea, diaphoresis, orthopnea, PND, claudication or edema Respiratory: denies cough, dyspnea, DOE, pleurisy, hoarseness, laryngitis or wheezing.  Gastrointestinal: Denies dysphagia, heartburn, reflux, water brash, pain, cramps, nausea, vomiting, bloating, diarrhea, constipation, hematemesis, melena, hematochezia, jaundice or hemorrhoids Genitourinary: Denies dysuria, frequency, urgency, nocturia, hesitancy, discharge, hematuria or flank pain Musculoskeletal: Denies  arthralgia, myalgia, stiffness, Jt. Swelling, pain, limp or strain/sprain. Denies Falls. Skin: Denies puritis, rash, hives, warts, acne, eczema or change in skin lesion Neuro: No weakness, tremor, incoordination, spasms, paresthesia or pain Psychiatric: Denies confusion, memory loss or sensory loss. Denies Depression. Endocrine: Denies change in weight, skin, hair change, nocturia, and paresthesia, diabetic polys, visual blurring or hyper / hypo glycemic episodes.  Heme/Lymph: No excessive bleeding, bruising or enlarged lymph nodes.  Physical Exam  BP 116/62   Pulse 67   Temp 98.1  F (36.7 C)   Resp 16   Ht 6' (1.829 m)   Wt 204 lb 12.8 oz (92.9 kg)   SpO2 98%   BMI 27.78 kg/m   General Appearance: Well nourished and well groomed and in no apparent distress.  Eyes: PERRLA, EOMs, conjunctiva no swelling or erythema, normal fundi and vessels. Sinuses: No frontal/maxillary tenderness ENT/Mouth: EACs patent / TMs  nl. Nares clear without erythema, swelling, mucoid exudates. Oral hygiene is good. No erythema, swelling, or exudate. Tongue normal, non-obstructing. Tonsils not swollen or erythematous. Hearing normal.  Neck: Supple, thyroid not palpable. No bruits, nodes or JVD. Respiratory: Respiratory effort normal.  BS equal and clear bilateral without rales, rhonci, wheezing or stridor. Cardio: Heart sounds are normal with regular rate and rhythm and no murmurs, rubs or gallops. Peripheral pulses are normal and equal bilaterally without edema. No aortic or femoral bruits. Chest: symmetric with normal excursions and percussion.  Abdomen: Soft, with Nl bowel sounds. Nontender, no guarding, rebound, hernias, masses, or organomegaly.  Lymphatics: Non tender without lymphadenopathy.  Musculoskeletal: Full ROM all peripheral extremities, joint stability, 5/5 strength, and normal gait. Skin: Warm and dry without rashes, lesions, cyanosis, clubbing or  ecchymosis.  Neuro: Cranial nerves intact,  reflexes equal bilaterally. Normal muscle tone, no cerebellar symptoms. Sensation intact.  Pysch: Alert and oriented X 3 with flat affect, insight and judgment appropriate.   Assessment and Plan   1. Essential hypertension  - EKG 12-Lead - [place], RETROPERITNL ABD,  LTD - Urinalysis, Routine w reflex microscopic - Microalbumin / creatinine urine ratio - CBC with Differential/Platelet - COMPLETE METABOLIC PANEL WITH GFR - Magnesium - TSH  2. Hyperlipidemia, mixed  - EKG 12-Lead - [place], RETROPERITNL ABD,  LTD - Lipid panel - TSH  3. Abnormal glucose  - EKG 12-Lead - [place], RETROPERITNL ABD,  LTD - Hemoglobin A1c - Insulin, random  4. Vitamin D deficiency  - VITAMIN D 25 Hydroxy  5. OSA on CPAP   6. Stage 3a chronic kidney disease (HCC)  - PTH, intact and calcium  7. Aortic atherosclerosis ([place]) by CT Scan on 06/22/2019  - EKG 12-Lead - [place], RETROPERITNL ABD,  LTD - TSH  8. Major depressive disorder ([place])   9. BPH with obstruction/lower urinary tract symptoms  - PSA  10. Screening for colorectal cancer  - POC Hemoccult Bld/Stl   11. Prostate cancer screening  - PSA  12. Screening for ischemic heart disease  - EKG 12-Lead  13. FHx: heart disease  - EKG 12-Lead - [place], RETROPERITNL ABD,  LTD  14. Former smoker  - EKG 12-Lead - [place], RETROPERITNL ABD,  LTD  15. Screening for AAA (aortic abdominal aneurysm)  - [place], RETROPERITNL ABD,  LTD  16. Medication management  - Urinalysis, Routine w reflex microscopic - Microalbumin / creatinine urine ratio - CBC with Differential/Platelet - COMPLETE METABOLIC PANEL WITH GFR - Magnesium - Lipid panel - TSH - Hemoglobin A1c - Insulin, random - VITAMIN D 25 Hydroxy        Patient was counseled in prudent diet, weight control to achieve/maintain BMI less than 25, BP monitoring, regular exercise and medications as discussed.  Discussed med effects and SE's. Routine screening labs and tests as requested with  regular follow-up as recommended. Over 40 minutes of exam, counseling, chart review and high complex critical decision making was performed   Kirtland Bouchard, MD

## 2021-01-09 ENCOUNTER — Ambulatory Visit: Payer: Medicare Other | Admitting: Internal Medicine

## 2021-01-09 ENCOUNTER — Ambulatory Visit: Payer: Medicare Other

## 2021-01-09 ENCOUNTER — Other Ambulatory Visit: Payer: Self-pay

## 2021-01-09 VITALS — BP 116/62 | HR 67 | Temp 98.1°F | Resp 16 | Ht 72.0 in | Wt 204.8 lb

## 2021-01-09 DIAGNOSIS — M545 Low back pain, unspecified: Secondary | ICD-10-CM

## 2021-01-09 DIAGNOSIS — E559 Vitamin D deficiency, unspecified: Secondary | ICD-10-CM

## 2021-01-09 DIAGNOSIS — R7309 Other abnormal glucose: Secondary | ICD-10-CM

## 2021-01-09 DIAGNOSIS — E782 Mixed hyperlipidemia: Secondary | ICD-10-CM | POA: Diagnosis not present

## 2021-01-09 DIAGNOSIS — N1831 Chronic kidney disease, stage 3a: Secondary | ICD-10-CM

## 2021-01-09 DIAGNOSIS — N401 Enlarged prostate with lower urinary tract symptoms: Secondary | ICD-10-CM

## 2021-01-09 DIAGNOSIS — Z1211 Encounter for screening for malignant neoplasm of colon: Secondary | ICD-10-CM

## 2021-01-09 DIAGNOSIS — I10 Essential (primary) hypertension: Secondary | ICD-10-CM

## 2021-01-09 DIAGNOSIS — Z87891 Personal history of nicotine dependence: Secondary | ICD-10-CM | POA: Diagnosis not present

## 2021-01-09 DIAGNOSIS — N138 Other obstructive and reflux uropathy: Secondary | ICD-10-CM

## 2021-01-09 DIAGNOSIS — G8929 Other chronic pain: Secondary | ICD-10-CM

## 2021-01-09 DIAGNOSIS — Z8249 Family history of ischemic heart disease and other diseases of the circulatory system: Secondary | ICD-10-CM | POA: Diagnosis not present

## 2021-01-09 DIAGNOSIS — F331 Major depressive disorder, recurrent, moderate: Secondary | ICD-10-CM

## 2021-01-09 DIAGNOSIS — Z1212 Encounter for screening for malignant neoplasm of rectum: Secondary | ICD-10-CM

## 2021-01-09 DIAGNOSIS — M6281 Muscle weakness (generalized): Secondary | ICD-10-CM

## 2021-01-09 DIAGNOSIS — Z7409 Other reduced mobility: Secondary | ICD-10-CM

## 2021-01-09 DIAGNOSIS — Z125 Encounter for screening for malignant neoplasm of prostate: Secondary | ICD-10-CM

## 2021-01-09 DIAGNOSIS — Z79899 Other long term (current) drug therapy: Secondary | ICD-10-CM

## 2021-01-09 DIAGNOSIS — I700 Atherosclerosis of aorta: Secondary | ICD-10-CM | POA: Diagnosis not present

## 2021-01-09 DIAGNOSIS — R296 Repeated falls: Secondary | ICD-10-CM

## 2021-01-09 DIAGNOSIS — G4733 Obstructive sleep apnea (adult) (pediatric): Secondary | ICD-10-CM

## 2021-01-09 DIAGNOSIS — Z9989 Dependence on other enabling machines and devices: Secondary | ICD-10-CM

## 2021-01-09 DIAGNOSIS — Z136 Encounter for screening for cardiovascular disorders: Secondary | ICD-10-CM

## 2021-01-09 NOTE — Therapy (Signed)
Outpatient Physical Therapy Treatment Patient Name: Cody Hines. MRN: [id number] DOB:January 22, 1946, 75 y.o., male Today's Date: 01/09/2021   PT End of Session - 01/09/21 1246    Visit Number 7    Number of Visits 17    Date for PT Re-Evaluation 02/06/21    Authorization Type [employer] Medicare    Progress Note Due on Visit 10    PT Start Time 1245    PT Stop Time 1315    PT Time Calculation (min) 30 min    Equipment Utilized During Treatment Gait belt    Activity Tolerance Patient tolerated treatment well    Behavior During Therapy WFL for tasks assessed/performed          PCP: [name], MD Referring Provier: Dohmeier, Asencion Partridge, MD  Referring Diagnosis:  S22.080S (ICD-10-CM) - Compression fracture of T12 vertebra, sequela  S06.9X9S (ICD-10-CM) - Traumatic brain injury with loss of consciousness, sequela (HCC)  R29.6 (ICD-10-CM) - Frequent falls  G31.84 (ICD-10-CM) - MCI (mild cognitive impairment) with memory loss  R26.89 (ICD-10-CM) - Multifactorial gait disorder  R26.89 (ICD-10-CM) - Shuffling gait   Visit Diagnosis: Muscle weakness (generalized)  Repeated falls  Chronic bilateral low back pain without sciatica  Impaired functional mobility, balance, gait, and endurance  Past Medical History:  Diagnosis Date  . BPH (benign prostatic hyperplasia)   . Fibromyalgia   . GERD (gastroesophageal reflux disease)   . Hyperlipidemia   . Hypertension   . Hypogonadism male   . IBS (irritable bowel syndrome)   . OSA (obstructive sleep apnea)    Past Surgical History:  Procedure Laterality Date  .  nasal smr np3  1985  . CATARACT EXTRACTION, BILATERAL Bilateral 2021   Dr. Katy Fitch, L 5/27, R 3/25  . KNEE ARTHROSCOPY Left 1999  . SKIN CANCER EXCISION  2020   nose  . SPINE SURGERY  2007   L5 S 1 Disk  . SPINE SURGERY  09/2019   Compression fracture stabilization by Dr. Arnoldo Morale  . VASECTOMY  1983   Patient Active Problem List   Diagnosis Date Noted  . Small  right kidney 04/05/2020  . Aortic atherosclerosis ([place]) by CT Scan on 06/22/2019 04/04/2020  . At moderate risk for fall 09/27/2019  . Compression fracture of T12 vertebra ([place]) 09/27/2019  . Hyperlipidemia, mixed 09/26/2019  . Personal history of skin cancer 09/26/2019  . CKD (chronic kidney disease) stage 3, GFR 30-59 ml/min (HCC) 03/23/2019  . Major depressive disorder, recurrent episode, moderate ([place]) 07/19/2018  . Chronic lumbar pain 03/12/2018  . Benign essential tremor 12/19/2016  . Encounter for Medicare annual wellness exam 08/10/2015  . Medication management 05/03/2014  . Vitamin D deficiency 05/03/2014  . Essential hypertension   . GERD (gastroesophageal reflux disease)   . IBS (irritable bowel syndrome)   . OSA on CPAP   . Fibromyalgia   . BPH (benign prostatic hyperplasia)   . Testosterone deficiency     SUBJECTIVE: No new falls  Pain: is patient experiencing pain?                VAS scale: 3/10              Pain location: Low Back              Pain description: intermittent, sharp and aching               Aggravating factors: getting out of the car; body turns  Relieving factors: rest.    OBJECTIVE:   Today's Treatment:  01/09/21:  Standing heel raises off edge of step: 2 x 10  Sit to stand: 20lbs 2 x 8  Standing deadlifts: 20lbs 5x from floor Functional tests:  5 times sit to stand: ; 21 sec (01/09/21) Functional gait assessment: 17/30- high fall risk Gait speed: 0.35ms- Slow per age related norms, increased     01/04/21:  Standing heel raises: from 1/2 foam roll: 2 x 10  Standing dead lifts: 15lbs KB from 6" box: 2 x 10  Walking bwd with elastic sport cord: 10 feet x 10 bwd; 10 feet x 8 fwd, occasional min A required, mostly CGA, cues for toe to heel with bwd walking and heel to toe for forward walking  01/02/21  Pt educated on always keeping one hand on support surface and one hand to pick up object to improve safety  awareness.  Sit to  stand: 15lbs 2 x 10   Standing deadlift: 15lb KB from 6" box: 2 x 5  Walking bwd with elastic sport cord: 10 feet, 5x, cues for toe to heel bwd and heel to toe fwd  Foam roll massage to L quads in supine  Walking bwd with elastic cord: 10 feet, 5x- 2nd trial   HOME EXERCISE PROGRAM:   Access Code: [email or web address]: [email or web address] Date: 12/19/2020 Prepared by: KBaldomero Lamy Exercises Romberg Stance with Eyes Closed - 1 x daily - 5 x weekly - 1 sets - 3 reps - 30 hold Romberg Stance with Head Rotation - 1 x daily - 5 x weekly - 2 sets - 10 reps Standing Romberg to 1/2 Tandem Stance - 1 x daily - 5 x weekly - 1 sets - 3 reps - 30 hold  Access Code: [email or web address] URL: [email or web address] Date: 12/26/2020 Prepared by: KMarkus Jarvis Exercises Supine Lower Trunk Rotation - 1 x daily - 7 x weekly - 2 sets - 10 reps Hooklying Single Knee to Chest Stretch - 1 x daily - 7 x weekly - 10 reps - 5 sec hold   Completed standing without support reaching for cones on ground to challenge balance (as this is how patient has fallen), completed x 6 reps with minimal imbalance noted, completed with close supervision.   Patient Education:  Education details: Pt educated to not close eyes and walk backwards at home as that is not part of his home instructions. It is not safe for him to perform everything that we do at home without supervision of skilled person Person educated: Patient Education method: Explanation, Handout, Demonstration Education comprehension: Verbalized understanding, Returned Demonstration     Home Exercise Program: HEP Issued. Handout Provided (Medbridge Code: [email or web address]    Assessment: Clinical impression:  Session was focused on progressing previous strengthening exercises to remain consistent due to patient's memory issues. Pt is gradually reporting reducing pain levels and no falls. Patient has demonstrated significant improvement in his 5x  sit to stand test score, FGA and gait speed. Patient is making gradual progress. Met all the STG today and making progress towards LTG.   Rehab potential: Fair memory issues, chronic condition   Clinical decision making: Stable/uncomplicated   Evaluation complexity: Moderate     Goals: Goals reviewed with patient? Yes   SHORT TERM GOALS:   STG Name Target Date Goal status  1 Pt will be able to perform 5 sit to stands without HHA in under 25 seconds to improve functional strength. Comments: 31 sec (eval); 21  sec (01/09/21) 01/09/21 Goal met  2 Pt will demo FGA score to improve by at least 4 points to improve functional balance.  Comments: Eval = 12/30; 17/30 (01/09/21) 01/09/21 Goal met  3 Pt will be compliant with waiting for 5 seconds before he transition from one position to other to improve safety awareness and reduce fall risk  Comments:  01/09/21 Goal met    LONG TERM GOALS:    LTG Name Target Date Goal status  1 Pt will demo normal gait speed of >0.51ms without AD to improve functional ambulation and reduce fall risk Comments:  02/06/21 INITIAL  2 Pt will demo at least 6 points improvement on FGA score to reduce fall risk //Comments: 12/30 (eval); 17/30 (01/09/21) 02/06/21 Progressing  3 Pt will be able to perform 5x sit to stand in under 20 seconds without HHA to improve functional strength Comments: 31 sec (eval); 21 sec (01/09/21) 02/06/21 Progressing  4 Pt will report <3 of near falls or "stumbles" in one week, on average, to reduce fall risk and improve safety awareness Comments: Eval= pt reports 8-10 stumbles a week 02/06/21 INITIAL    ( PLAN: PT frequency: 2x/week   PT duration: 8 weeks   Planned Interventions: Therapeutic exercises, Therapeutic activity, Neuro Muscular re-education, Balance training, Gait training, Patient/Family education, Joint mobilization, Stair training, Orthotic/Fit training, Spinal mobilization, Cryotherapy, Moist heat and Traction   Plan for next session:  Work on walking bwd, progress balance and strengthening    KKerrie Pleasure PT, DPT 01/09/2021, [address] [place] [address]Phone: [phone]  Fax:  [phone] Patient name: Cody Hines MRN: [id number]: 112-Dec-1947

## 2021-01-09 NOTE — Progress Notes (Signed)
aortaScan < 3 cm. Within normal limits, per Dr Melford Aase.

## 2021-01-10 LAB — COMPLETE METABOLIC PANEL WITH GFR
AG Ratio: 1.5 (calc) (ref 1.0–2.5)
ALT: 21 U/L (ref 9–46)
AST: 35 U/L (ref 10–35)
Albumin: 3.8 g/dL (ref 3.6–5.1)
Alkaline phosphatase (APISO): 81 U/L (ref 35–144)
BUN/Creatinine Ratio: 7 (calc) (ref 6–22)
BUN: 10 mg/dL (ref 7–25)
CO2: 32 mmol/L (ref 20–32)
Calcium: 9.5 mg/dL (ref 8.6–10.3)
Chloride: 104 mmol/L (ref 98–110)
Creat: 1.42 mg/dL — ABNORMAL HIGH (ref 0.70–1.18)
GFR, Est African American: 57 mL/min/{1.73_m2} — ABNORMAL LOW (ref >=60)
GFR, Est Non African American: 49 mL/min/{1.73_m2} — ABNORMAL LOW (ref >=60)
Globulin: 2.5 g/dL (calc) (ref 1.9–3.7)
Glucose, Bld: 91 mg/dL (ref 65–99)
Potassium: 4.4 mmol/L (ref 3.5–5.3)
Sodium: 142 mmol/L (ref 135–146)
Total Bilirubin: 1.1 mg/dL (ref 0.2–1.2)
Total Protein: 6.3 g/dL (ref 6.1–8.1)

## 2021-01-10 LAB — PSA: PSA: 0.55 ng/mL (ref <=4.0)

## 2021-01-10 LAB — CBC WITH DIFFERENTIAL/PLATELET
Absolute Monocytes: 528 cells/uL (ref 200–950)
Basophils Absolute: 29 cells/uL (ref 0–200)
Basophils Relative: 0.5 %
Eosinophils Absolute: 110 cells/uL (ref 15–500)
Eosinophils Relative: 1.9 %
HCT: 38.3 % — ABNORMAL LOW (ref 38.5–50.0)
Hemoglobin: 12.6 g/dL — ABNORMAL LOW (ref 13.2–17.1)
Lymphs Abs: 1995 cells/uL (ref 850–3900)
MCH: 30.1 pg (ref 27.0–33.0)
MCHC: 32.9 g/dL (ref 32.0–36.0)
MCV: 91.6 fL (ref 80.0–100.0)
MPV: 11.1 fL (ref 7.5–12.5)
Monocytes Relative: 9.1 %
Neutro Abs: 3138 cells/uL (ref 1500–7800)
Neutrophils Relative %: 54.1 %
Platelets: 117 10*3/uL — ABNORMAL LOW (ref 140–400)
RBC: 4.18 10*6/uL — ABNORMAL LOW (ref 4.20–5.80)
RDW: 13.9 % (ref 11.0–15.0)
Total Lymphocyte: 34.4 %
WBC: 5.8 10*3/uL (ref 3.8–10.8)

## 2021-01-10 LAB — URINALYSIS, ROUTINE W REFLEX MICROSCOPIC
Bilirubin Urine: NEGATIVE
Glucose, UA: NEGATIVE
Hgb urine dipstick: NEGATIVE
Ketones, ur: NEGATIVE
Leukocytes,Ua: NEGATIVE
Nitrite: NEGATIVE
Protein, ur: NEGATIVE
Specific Gravity, Urine: 1.011 (ref 1.001–1.03)
pH: 8 (ref 5.0–8.0)

## 2021-01-10 LAB — PTH, INTACT AND CALCIUM
Calcium: 9.5 mg/dL (ref 8.6–10.3)
PTH: 42 pg/mL (ref 16–77)

## 2021-01-10 LAB — VITAMIN D 25 HYDROXY (VIT D DEFICIENCY, FRACTURES): Vit D, 25-Hydroxy: 58 ng/mL (ref 30–100)

## 2021-01-10 LAB — LIPID PANEL
Cholesterol: 136 mg/dL (ref <200)
HDL: 55 mg/dL (ref >=40)
LDL Cholesterol (Calc): 65 mg/dL (calc)
Non-HDL Cholesterol (Calc): 81 mg/dL (calc) (ref <130)
Total CHOL/HDL Ratio: 2.5 (calc) (ref <5.0)
Triglycerides: 82 mg/dL (ref <150)

## 2021-01-10 LAB — HEMOGLOBIN A1C
Hgb A1c MFr Bld: 5.5 % of total Hgb (ref <5.7)
Mean Plasma Glucose: 111 mg/dL
eAG (mmol/L): 6.2 mmol/L

## 2021-01-10 LAB — MICROALBUMIN / CREATININE URINE RATIO
Creatinine, Urine: 68 mg/dL (ref 20–320)
Microalb Creat Ratio: 6 mcg/mg creat (ref <30)
Microalb, Ur: 0.4 mg/dL

## 2021-01-10 LAB — INSULIN, RANDOM: Insulin: 10.1 u[IU]/mL

## 2021-01-10 LAB — MAGNESIUM: Magnesium: 2.3 mg/dL (ref 1.5–2.5)

## 2021-01-10 LAB — TSH: TSH: 3.29 mIU/L (ref 0.40–4.50)

## 2021-01-10 NOTE — Progress Notes (Signed)
============================================================ -   Test results slightly outside the reference range are not unusual. If there is anything important, I will review this with you,  otherwise it is considered normal test values.  If you have further questions,  please do not hesitate to contact me at the office or via My Chart.  ============================================================ ============================================================  -  PSA - Very Low - Great  ============================================================ ============================================================  -  PTH - is a Hormone that regulates Calcium balance & is Normal   ============================================================ ============================================================  -  Kidney Functions still Stage 3a & Stable ============================================================ ============================================================  -  Total Chol = 136 and LDL = 65 - Both  Excellent   - Very low risk for Heart Attack  / Stroke ============================================================ ============================================================  -  A1c - Normal  -  Great - No Diabetes   ! ============================================================ ============================================================  -  Vitamin D = 58 - Great  ============================================================ ============================================================  -  All Else - CBC - Electrolytes - Liver - Magnesium & Thyroid    - all  Normal / OK ============================================================ ============================================================  - Keep up the [place] Work  !  ============================================================ ============================================================  -

## 2021-01-11 ENCOUNTER — Ambulatory Visit: Payer: Medicare Other

## 2021-01-11 ENCOUNTER — Ambulatory Visit: Payer: Medicare Other | Admitting: Psychiatry

## 2021-01-11 ENCOUNTER — Other Ambulatory Visit: Payer: Self-pay

## 2021-01-11 DIAGNOSIS — M6281 Muscle weakness (generalized): Secondary | ICD-10-CM | POA: Diagnosis not present

## 2021-01-11 DIAGNOSIS — R296 Repeated falls: Secondary | ICD-10-CM

## 2021-01-11 DIAGNOSIS — Z7409 Other reduced mobility: Secondary | ICD-10-CM

## 2021-01-11 DIAGNOSIS — G8929 Other chronic pain: Secondary | ICD-10-CM

## 2021-01-11 NOTE — Therapy (Signed)
Outpatient Physical Therapy Treatment Patient Name: Cody Hines. MRN: [id number] DOB:06/24/1946, 75 y.o., male Today's Date: 01/11/2021   PT End of Session - 01/11/21 1109    Visit Number 8    Number of Visits 17    Date for PT Re-Evaluation 02/06/21    Authorization Type UHC Medicare    Progress Note Due on Visit 10    PT Start Time 1100    PT Stop Time 1145    PT Time Calculation (min) 45 min    Equipment Utilized During Treatment Gait belt    Activity Tolerance Patient tolerated treatment well    Behavior During Therapy WFL for tasks assessed/performed          PCP: [name], MD Referring Provier: Dohmeier, Asencion Partridge, MD  Referring Diagnosis:  S22.080S (ICD-10-CM) - Compression fracture of T12 vertebra, sequela  S06.9X9S (ICD-10-CM) - Traumatic brain injury with loss of consciousness, sequela (HCC)  R29.6 (ICD-10-CM) - Frequent falls  G31.84 (ICD-10-CM) - MCI (mild cognitive impairment) with memory loss  R26.89 (ICD-10-CM) - Multifactorial gait disorder  R26.89 (ICD-10-CM) - Shuffling gait   Visit Diagnosis: Repeated falls  Muscle weakness (generalized)  Chronic bilateral low back pain without sciatica  Impaired functional mobility, balance, gait, and endurance  Past Medical History:  Diagnosis Date  . BPH (benign prostatic hyperplasia)   . Fibromyalgia   . GERD (gastroesophageal reflux disease)   . Hyperlipidemia   . Hypertension   . Hypogonadism male   . IBS (irritable bowel syndrome)   . OSA (obstructive sleep apnea)    Past Surgical History:  Procedure Laterality Date  .  nasal smr np3  1985  . CATARACT EXTRACTION, BILATERAL Bilateral 2021   Dr. Katy Fitch, L 5/27, R 3/25  . KNEE ARTHROSCOPY Left 1999  . SKIN CANCER EXCISION  2020   nose  . SPINE SURGERY  2007   L5 S 1 Disk  . SPINE SURGERY  09/2019   Compression fracture stabilization by Dr. Arnoldo Morale  . VASECTOMY  1983   Patient Active Problem List   Diagnosis Date Noted  . Small  right kidney 04/05/2020  . Aortic atherosclerosis ([place]) by CT Scan on 06/22/2019 04/04/2020  . At moderate risk for fall 09/27/2019  . Compression fracture of T12 vertebra ([place]) 09/27/2019  . Hyperlipidemia, mixed 09/26/2019  . Personal history of skin cancer 09/26/2019  . CKD (chronic kidney disease) stage 3, GFR 30-59 ml/min (HCC) 03/23/2019  . Major depressive disorder, recurrent episode, moderate ([place]) 07/19/2018  . Chronic lumbar pain 03/12/2018  . Benign essential tremor 12/19/2016  . Encounter for Medicare annual wellness exam 08/10/2015  . Medication management 05/03/2014  . Vitamin D deficiency 05/03/2014  . Essential hypertension   . GERD (gastroesophageal reflux disease)   . IBS (irritable bowel syndrome)   . OSA on CPAP   . Fibromyalgia   . BPH (benign prostatic hyperplasia)   . Testosterone deficiency     SUBJECTIVE: No new falls. I am mindful of holding onto something with one hand when pick something off the floor.  Pain: is patient experiencing pain?                VAS scale: 3/10              Pain location: Low Back              Pain description: intermittent, sharp and aching               Aggravating  factors: getting out of the car; body turns              Relieving factors: rest.    OBJECTIVE:   Today's Treatment:  01/11/21:  Bil leg press: 80lbs 10x (seat 12)  Uni leg press: 50lbs 10x R and L (Seat 12)  Statuc lunges with bil HHA: 2 x 5 R and L  Seated lumbar flexion: 10x  Static lunge: 5x R and L, min A with bil HHA with UE use  1/2 foam roll heel raises: 20x  Box taps: 14" box: 10x R and L  Walking fwd and bwd over 4' ramp: 10x, incline and decline  01/09/21:  Standing heel raises off edge of step: 2 x 10  Sit to stand: 20lbs 2 x 8  Standing deadlifts: 20lbs 5x from floor Functional tests:  5 times sit to stand: ; 21 sec (01/09/21) Functional gait assessment: 17/30- high fall risk Gait speed: 0.49ms- Slow per age related norms, increased      01/04/21:  Standing heel raises: from 1/2 foam roll: 2 x 10  Standing dead lifts: 15lbs KB from 6" box: 2 x 10  Walking bwd with elastic sport cord: 10 feet x 10 bwd; 10 feet x 8 fwd, occasional min A required, mostly CGA, cues for toe to heel with bwd walking and heel to toe for forward walking   HOME EXERCISE PROGRAM:   Access Code: [email or web address]: [email or web address] Date: 12/19/2020 Prepared by: KBaldomero Lamy Exercises Romberg Stance with Eyes Closed - 1 x daily - 5 x weekly - 1 sets - 3 reps - 30 hold Romberg Stance with Head Rotation - 1 x daily - 5 x weekly - 2 sets - 10 reps Standing Romberg to 1/2 Tandem Stance - 1 x daily - 5 x weekly - 1 sets - 3 reps - 30 hold  Access Code: [email or web address] URL: [email or web address] Date: 12/26/2020 Prepared by: KMarkus Jarvis Exercises Supine Lower Trunk Rotation - 1 x daily - 7 x weekly - 2 sets - 10 reps Hooklying Single Knee to Chest Stretch - 1 x daily - 7 x weekly - 10 reps - 5 sec hold   Completed standing without support reaching for cones on ground to challenge balance (as this is how patient has fallen), completed x 6 reps with minimal imbalance noted, completed with close supervision.   Patient Education:  Education details: Pt educated to not close eyes and walk backwards at home as that is not part of his home instructions. It is not safe for him to perform everything that we do at home without supervision of skilled person Person educated: Patient Education method: Explanation, Handout, Demonstration Education comprehension: Verbalized understanding, Returned Demonstration     Home Exercise Program: HEP Issued. Handout Provided (Medbridge Code: [email or web address]    Assessment: Clinical impression:  Pt has decreased quad activation from deep knee flexion due to weakness. Pt has decreased hamstring and back tightness wihich makes it difficult for him to squat. Patient reported that he is mindful of  holding onto wall or something when bends forward to pick something up which is showing improving safety awareness.    Rehab potential: Fair memory issues, chronic condition   Clinical decision making: Stable/uncomplicated   Evaluation complexity: Moderate     Goals: Goals reviewed with patient? Yes   SHORT TERM GOALS:   STG Name Target Date Goal status  1 Pt will be able to perform 5 sit to stands without HHA in under  25 seconds to improve functional strength. Comments: 31 sec (eval); 21 sec (01/09/21) 01/09/21 Goal met  2 Pt will demo FGA score to improve by at least 4 points to improve functional balance.  Comments: Eval = 12/30; 17/30 (01/09/21) 01/09/21 Goal met  3 Pt will be compliant with waiting for 5 seconds before he transition from one position to other to improve safety awareness and reduce fall risk  Comments:  01/09/21 Goal met    LONG TERM GOALS:    LTG Name Target Date Goal status  1 Pt will demo normal gait speed of >0.13ms without AD to improve functional ambulation and reduce fall risk Comments:  02/06/21 INITIAL  2 Pt will demo at least 6 points improvement on FGA score to reduce fall risk //Comments: 12/30 (eval); 17/30 (01/09/21) 02/06/21 Progressing  3 Pt will be able to perform 5x sit to stand in under 20 seconds without HHA to improve functional strength Comments: 31 sec (eval); 21 sec (01/09/21) 02/06/21 Progressing  4 Pt will report <3 of near falls or "stumbles" in one week, on average, to reduce fall risk and improve safety awareness Comments: Eval= pt reports 8-10 stumbles a week 02/06/21 INITIAL    ( PLAN: PT frequency: 2x/week   PT duration: 8 weeks   Planned Interventions: Therapeutic exercises, Therapeutic activity, Neuro Muscular re-education, Balance training, Gait training, Patient/Family education, Joint mobilization, Stair training, Orthotic/Fit training, Spinal mobilization, Cryotherapy, Moist heat and Traction   Plan for next session: Start adding dual  tasking with cognitive tasks; Work on walking bwd, progress balance and strengthening    KKerrie Pleasure PT, DPT 01/11/2021, [address] [address]Phone: [phone]  Fax:  [phone] [facility]: [facility] S[facility] Hines MRN: [id number]: [birth year]

## 2021-01-14 ENCOUNTER — Ambulatory Visit: Payer: Medicare Other

## 2021-01-14 ENCOUNTER — Other Ambulatory Visit: Payer: Self-pay

## 2021-01-14 DIAGNOSIS — M6281 Muscle weakness (generalized): Secondary | ICD-10-CM | POA: Diagnosis not present

## 2021-01-14 DIAGNOSIS — M545 Low back pain, unspecified: Secondary | ICD-10-CM

## 2021-01-14 DIAGNOSIS — Z7409 Other reduced mobility: Secondary | ICD-10-CM

## 2021-01-14 DIAGNOSIS — R296 Repeated falls: Secondary | ICD-10-CM

## 2021-01-14 NOTE — Therapy (Signed)
Outpatient Physical Therapy Treatment Patient Name: Cody Hines. MRN: [id number] DOB:[birth year], 75 y.o., male Today's Date: 01/14/2021   PT End of Session - 01/14/21 1242    Visit Number 9    Number of Visits 17    Date for PT Re-Evaluation 02/06/21    Authorization Type [employer] Medicare    Progress Note Due on Visit 10    PT Start Time 2505    PT Stop Time 1315    PT Time Calculation (min) 40 min    Equipment Utilized During Treatment Gait belt    Activity Tolerance Patient tolerated treatment well    Behavior During Therapy WFL for tasks assessed/performed          PCP: [name], MD Referring Provier: Dohmeier, Asencion Partridge, MD  Referring Diagnosis:  S22.080S (ICD-10-CM) - Compression fracture of T12 vertebra, sequela  S06.9X9S (ICD-10-CM) - Traumatic brain injury with loss of consciousness, sequela (HCC)  R29.6 (ICD-10-CM) - Frequent falls  G31.84 (ICD-10-CM) - MCI (mild cognitive impairment) with memory loss  R26.89 (ICD-10-CM) - Multifactorial gait disorder  R26.89 (ICD-10-CM) - Shuffling gait   Visit Diagnosis: Muscle weakness (generalized)  Repeated falls  Chronic bilateral low back pain without sciatica  Impaired functional mobility, balance, gait, and endurance  Past Medical History:  Diagnosis Date  . BPH (benign prostatic hyperplasia)   . Fibromyalgia   . GERD (gastroesophageal reflux disease)   . Hyperlipidemia   . Hypertension   . Hypogonadism male   . IBS (irritable bowel syndrome)   . OSA (obstructive sleep apnea)    Past Surgical History:  Procedure Laterality Date  .  nasal smr np3  1985  . CATARACT EXTRACTION, BILATERAL Bilateral 2021   Dr. Katy Fitch, L 5/27, R 3/25  . KNEE ARTHROSCOPY Left 1999  . SKIN CANCER EXCISION  2020   nose  . SPINE SURGERY  2007   L5 S 1 Disk  . SPINE SURGERY  09/2019   Compression fracture stabilization by Dr. Arnoldo Morale  . VASECTOMY  1983   Patient Active Problem List   Diagnosis Date Noted  . Small  right kidney 04/05/2020  . Aortic atherosclerosis ([place]) by CT Scan on 06/22/2019 04/04/2020  . At moderate risk for fall 09/27/2019  . Compression fracture of T12 vertebra ([place]) 09/27/2019  . Hyperlipidemia, mixed 09/26/2019  . Personal history of skin cancer 09/26/2019  . CKD (chronic kidney disease) stage 3, GFR 30-59 ml/min (HCC) 03/23/2019  . Major depressive disorder, recurrent episode, moderate ([place]) 07/19/2018  . Chronic lumbar pain 03/12/2018  . Benign essential tremor 12/19/2016  . Encounter for Medicare annual wellness exam 08/10/2015  . Medication management 05/03/2014  . Vitamin D deficiency 05/03/2014  . Essential hypertension   . GERD (gastroesophageal reflux disease)   . IBS (irritable bowel syndrome)   . OSA on CPAP   . Fibromyalgia   . BPH (benign prostatic hyperplasia)   . Testosterone deficiency     SUBJECTIVE: I am little stiff. I was standing in yard and just looking and I felt like I started swaying. I didn't fall but it was the first time it happened. Pt still reporting L knee giving away at least once a day, espeically with steps.   Pain: is patient experiencing pain?                VAS scale: 3/10              Pain location: Low Back  Pain description: intermittent, sharp and aching               Aggravating factors: getting out of the car; body turns              Relieving factors: rest.    OBJECTIVE:   Today's Treatment:  01/14/21:  Sci fit: manual level 10 for 6'  Seated lumbar flexion: 10x  Static lunge: 5x R and L, CGA with R leg forward, but needed mod A during the last rep with L leg fwd due to R leg giving away due to fatigue.  Standing deadlift: 15lb KB from floor: 5x; 20lbs KB from floor: 5x  Patient education: pt reports that he carries laundr basket up and down with back against wall/rail. Pt was advised to get a pop up laundry hamper with handles so he can carry basket with on e hand hold rail with other to improve  safety.   Fwd step up: 8" box with 20lb KB in contralateral hand and ipsilateral hand on Rail of parallel bar: 10x R and L  Gait training: 240' in the gym with cognitive tasks: count backwards by 2s, 4s, 6s, count fwd by 3s, 5s. Pt tended to close his eyes to perform calculations and needed cues to keep eyes open as he bumped into obstacle 2x during walking.  01/11/21:  Bil leg press: 80lbs 10x (seat 12)  Uni leg press: 50lbs 10x R and L (Seat 12)  Statuc lunges with bil HHA: 2 x 5 R and L  Seated lumbar flexion: 10x  Static lunge: 5x R and L, min A with bil HHA with UE use  1/2 foam roll heel raises: 20x  Box taps: 14" box: 10x R and L  Walking fwd and bwd over 4' ramp: 10x, incline and decline  01/09/21:  Standing heel raises off edge of step: 2 x 10  Sit to stand: 20lbs 2 x 8  Standing deadlifts: 20lbs 5x from floor Functional tests:  5 times sit to stand: ; 21 sec (01/09/21) Functional gait assessment: 17/30- high fall risk Gait speed: 0.85ms- Slow per age related norms, increased   HOME EXERCISE PROGRAM:   Access Code: [email or web address]: [email or web address] Date: 12/19/2020 Prepared by: KBaldomero Lamy Exercises Romberg Stance with Eyes Closed - 1 x daily - 5 x weekly - 1 sets - 3 reps - 30 hold Romberg Stance with Head Rotation - 1 x daily - 5 x weekly - 2 sets - 10 reps Standing Romberg to 1/2 Tandem Stance - 1 x daily - 5 x weekly - 1 sets - 3 reps - 30 hold  Access Code: [email or web address] URL: [email or web address] Date: 12/26/2020 Prepared by: KMarkus Jarvis Exercises Supine Lower Trunk Rotation - 1 x daily - 7 x weekly - 2 sets - 10 reps Hooklying Single Knee to Chest Stretch - 1 x daily - 7 x weekly - 10 reps - 5 sec hold   Completed standing without support reaching for cones on ground to challenge balance (as this is how patient has fallen), completed x 6 reps with minimal imbalance noted, completed with close supervision.   Patient Education:   Education details: Pt educated to not close eyes and walk backwards at home as that is not part of his home instructions. It is not safe for him to perform everything that we do at home without supervision of skilled person Person educated: Patient Education method: Explanation, Handout, Demonstration Education comprehension: Verbalized understanding, Returned Demonstration  Home Exercise Program: HEP Issued. Handout Provided (Medbridge Code: [email or web address])    Assessment: Clinical impression:  Pt was overall able to perform static lunge more on his own except for during the last rep with  L leg fwd when R knee gave out due to fatigue. Pt's legs continues to be weak during functional tasks of getting off the floor. Pt will continue to benefit from skilled PT to improve work on dual tasking with physical activities.   Rehab potential: Fair memory issues, chronic condition   Clinical decision making: Stable/uncomplicated   Evaluation complexity: Moderate     Goals: Goals reviewed with patient? Yes   SHORT TERM GOALS:   STG Name Target Date Goal status  1 Pt will be able to perform 5 sit to stands without HHA in under 25 seconds to improve functional strength. Comments: 31 sec (eval); 21 sec (01/09/21) 01/09/21 Goal met  2 Pt will demo FGA score to improve by at least 4 points to improve functional balance.  Comments: Eval = 12/30; 17/30 (01/09/21) 01/09/21 Goal met  3 Pt will be compliant with waiting for 5 seconds before he transition from one position to other to improve safety awareness and reduce fall risk  Comments:  01/09/21 Goal met    LONG TERM GOALS:    LTG Name Target Date Goal status  1 Pt will demo normal gait speed of >0.45ms without AD to improve functional ambulation and reduce fall risk Comments:  02/06/21 INITIAL  2 Pt will demo at least 6 points improvement on FGA score to reduce fall risk //Comments: 12/30 (eval); 17/30 (01/09/21) 02/06/21 Progressing  3 Pt will be able to  perform 5x sit to stand in under 20 seconds without HHA to improve functional strength Comments: 31 sec (eval); 21 sec (01/09/21) 02/06/21 Progressing  4 Pt will report <3 of near falls or "stumbles" in one week, on average, to reduce fall risk and improve safety awareness Comments: Eval= pt reports 8-10 stumbles a week 02/06/21 INITIAL    ( PLAN: PT frequency: 2x/week   PT duration: 8 weeks   Planned Interventions: Therapeutic exercises, Therapeutic activity, Neuro Muscular re-education, Balance training, Gait training, Patient/Family education, Joint mobilization, Stair training, Orthotic/Fit training, Spinal mobilization, Cryotherapy, Moist heat and Traction   Plan for next session: Start adding dual tasking with cognitive tasks; Work on walking bwd, progress balance and strengthening    KKerrie Pleasure PT, DPT 01/14/2021, [address] [address]Phone: [phone]  Fax:  [phone] [facility] SJamien Casanova MRN: [id number]: [birth year]

## 2021-01-16 ENCOUNTER — Ambulatory Visit: Payer: Medicare Other

## 2021-01-16 ENCOUNTER — Other Ambulatory Visit: Payer: Self-pay

## 2021-01-16 DIAGNOSIS — M6281 Muscle weakness (generalized): Secondary | ICD-10-CM | POA: Diagnosis not present

## 2021-01-16 DIAGNOSIS — G8929 Other chronic pain: Secondary | ICD-10-CM

## 2021-01-16 DIAGNOSIS — Z7409 Other reduced mobility: Secondary | ICD-10-CM

## 2021-01-16 DIAGNOSIS — R296 Repeated falls: Secondary | ICD-10-CM

## 2021-01-16 NOTE — Therapy (Signed)
Outpatient Physical Therapy Discharge Note. Patient Name: Cody Hines. MRN: [id number] DOB:1945/12/06, 75 y.o., male Today's Date: 01/16/2021   PT End of Session - 01/16/21 1115    Visit Number 10    Number of Visits 17    Date for PT Re-Evaluation 02/06/21    Authorization Type UHC Medicare    Progress Note Due on Visit 10    PT Start Time 1110    PT Stop Time 1145    PT Time Calculation (min) 35 min    Equipment Utilized During Treatment Gait belt    Activity Tolerance Patient tolerated treatment well    Behavior During Therapy WFL for tasks assessed/performed          PCP: [name], MD Referring Provier: Dohmeier, Asencion Partridge, MD  Referring Diagnosis:  S22.080S (ICD-10-CM) - Compression fracture of T12 vertebra, sequela  S06.9X9S (ICD-10-CM) - Traumatic brain injury with loss of consciousness, sequela (HCC)  R29.6 (ICD-10-CM) - Frequent falls  G31.84 (ICD-10-CM) - MCI (mild cognitive impairment) with memory loss  R26.89 (ICD-10-CM) - Multifactorial gait disorder  R26.89 (ICD-10-CM) - Shuffling gait   Visit Diagnosis: Muscle weakness (generalized)  Repeated falls  Chronic bilateral low back pain without sciatica  Impaired functional mobility, balance, gait, and endurance  Past Medical History:  Diagnosis Date  . BPH (benign prostatic hyperplasia)   . Fibromyalgia   . GERD (gastroesophageal reflux disease)   . Hyperlipidemia   . Hypertension   . Hypogonadism male   . IBS (irritable bowel syndrome)   . OSA (obstructive sleep apnea)    Past Surgical History:  Procedure Laterality Date  .  nasal smr np3  1985  . CATARACT EXTRACTION, BILATERAL Bilateral 2021   Dr. Katy Fitch, L 5/27, R 3/25  . KNEE ARTHROSCOPY Left 1999  . SKIN CANCER EXCISION  2020   nose  . SPINE SURGERY  2007   L5 S 1 Disk  . SPINE SURGERY  09/2019   Compression fracture stabilization by Dr. Arnoldo Morale  . VASECTOMY  1983   Patient Active Problem List   Diagnosis Date Noted   . Small right kidney 04/05/2020  . Aortic atherosclerosis ([place]) by CT Scan on 06/22/2019 04/04/2020  . At moderate risk for fall 09/27/2019  . Compression fracture of T12 vertebra ([place]) 09/27/2019  . Hyperlipidemia, mixed 09/26/2019  . Personal history of skin cancer 09/26/2019  . CKD (chronic kidney disease) stage 3, GFR 30-59 ml/min (HCC) 03/23/2019  . Major depressive disorder, recurrent episode, moderate ([place]) 07/19/2018  . Chronic lumbar pain 03/12/2018  . Benign essential tremor 12/19/2016  . Encounter for Medicare annual wellness exam 08/10/2015  . Medication management 05/03/2014  . Vitamin D deficiency 05/03/2014  . Essential hypertension   . GERD (gastroesophageal reflux disease)   . IBS (irritable bowel syndrome)   . OSA on CPAP   . Fibromyalgia   . BPH (benign prostatic hyperplasia)   . Testosterone deficiency     SUBJECTIVE: I am little stiff. I was standing in yard and just looking and I felt like I started swaying. I didn't fall but it was the first time it happened. Pt still reporting L knee giving away at least once a day, espeically with steps.   Pain: is patient experiencing pain?                VAS scale: 3/10              Pain location: Low Back  Pain description: intermittent, sharp and aching               Aggravating factors: getting out of the car; body turns              Relieving factors: rest.    OBJECTIVE:   Today's Treatment:  01/16/21:  Goals assessed Functional Gait assessment: 21/30 Gait training: SBA  10 feet of gravel,10 feet of mulch, 200 feet of grass, 100 feet on incline/decline moderate, going up and down curb step  01/14/21:  Sci fit: manual level 10 for 6'  Seated lumbar flexion: 10x  Static lunge: 5x R and L, CGA with R leg forward, but needed mod A during the last rep with L leg fwd due to R leg giving away due to fatigue.  Standing deadlift: 15lb KB from floor: 5x; 20lbs KB from floor: 5x  Patient education: pt  reports that he carries laundr basket up and down with back against wall/rail. Pt was advised to get a pop up laundry hamper with handles so he can carry basket with on e hand hold rail with other to improve safety.   Fwd step up: 8" box with 20lb KB in contralateral hand and ipsilateral hand on Rail of parallel bar: 10x R and L  Gait training: 240' in the gym with cognitive tasks: count backwards by 2s, 4s, 6s, count fwd by 3s, 5s. Pt tended to close his eyes to perform calculations and needed cues to keep eyes open as he bumped into obstacle 2x during walking.  01/11/21:  Bil leg press: 80lbs 10x (seat 12)  Uni leg press: 50lbs 10x R and L (Seat 12)  Statuc lunges with bil HHA: 2 x 5 R and L  Seated lumbar flexion: 10x  Static lunge: 5x R and L, min A with bil HHA with UE use  1/2 foam roll heel raises: 20x  Box taps: 14" box: 10x R and L  Walking fwd and bwd over 4' ramp: 10x, incline and decline  01/09/21:  Standing heel raises off edge of step: 2 x 10  Sit to stand: 20lbs 2 x 8  Standing deadlifts: 20lbs 5x from floor Functional tests:  5 times sit to stand: ; 21 sec (01/09/21) Functional gait assessment: 17/30- high fall risk Gait speed: 0.59ms- Slow per age related norms, increased   HOME EXERCISE PROGRAM:   Access Code: [email or web address]: [email or web address] Date: 12/19/2020 Prepared by: KBaldomero Lamy Exercises Romberg Stance with Eyes Closed - 1 x daily - 5 x weekly - 1 sets - 3 reps - 30 hold Romberg Stance with Head Rotation - 1 x daily - 5 x weekly - 2 sets - 10 reps Standing Romberg to 1/2 Tandem Stance - 1 x daily - 5 x weekly - 1 sets - 3 reps - 30 hold  Access Code: [email or web address] URL: [email or web address] Date: 12/26/2020 Prepared by: KMarkus Jarvis Exercises Supine Lower Trunk Rotation - 1 x daily - 7 x weekly - 2 sets - 10 reps Hooklying Single Knee to Chest Stretch - 1 x daily - 7 x weekly - 10 reps - 5 sec hold   Completed standing  without support reaching for cones on ground to challenge balance (as this is how patient has fallen), completed x 6 reps with minimal imbalance noted, completed with close supervision.   Patient Education:  Education details: Pt educated to not close eyes and walk backwards at home as that is not part of his home instructions.  It is not safe for him to perform everything that we do at home without supervision of skilled person Person educated: Patient Education method: Explanation, Handout, Demonstration Education comprehension: Verbalized understanding, Returned Demonstration     Home Exercise Program: HEP Issued. Handout Provided (Medbridge Code: [email or web address])    Assessment: Clinical impression:  Patient has been seen for total of 10 sessions from  12/12/20 to 01/16/21 for gait and balance disorder. Patient has progressed well and has met all of his short term and long term goals. Patient has demonstrated significant improvement in his functional strength and balance with functional tests. Patient is independent with home exercise program and will be discharged from skilled PT.  Rehab potential: Fair memory issues, chronic condition   Clinical decision making: Stable/uncomplicated   Evaluation complexity: Moderate     Goals: Goals reviewed with patient? Yes   SHORT TERM GOALS:   STG Name Target Date Goal status  1 Pt will be able to perform 5 sit to stands without HHA in under 25 seconds to improve functional strength. Comments: 31 sec (eval); 21 sec (01/09/21) 01/09/21 Goal met  2 Pt will demo FGA score to improve by at least 4 points to improve functional balance.  Comments: Eval = 12/30; 17/30 (01/09/21) 01/09/21 Goal met  3 Pt will be compliant with waiting for 5 seconds before he transition from one position to other to improve safety awareness and reduce fall risk  Comments:  01/09/21 Goal met    LONG TERM GOALS:    LTG Name Target Date Goal status  1 Pt will demo normal gait speed of  >0.73ms without AD to improve functional ambulation and reduce fall risk Comments:0.542m (eval);  0.83 m/s (01/16/21) 02/06/21 Goal met.  2 Pt will demo at least 6 points improvement on FGA score to reduce fall risk //Comments: 12/30 (eval); 17/30 (01/09/21), 21/30 (01/16/21) 02/06/21 Goal met.  3 Pt will be able to perform 5x sit to stand in under 20 seconds without HHA to improve functional strength Comments: 31 sec (eval); 21 sec (01/09/21) 02/06/21 Goal met  4 Pt will report <3 of near falls or "stumbles" in one week, on average, to reduce fall risk and improve safety awareness Comments: Eval= pt reports 8-10 stumbles a week; <3 stumbles per week per patient (01/16/21) 02/06/21 Goal met.    ( PLAN: Pt will be discharged from skilled PT.    KaKerrie PleasurePT, DPT 01/16/2021, [address]one: [phone] Fax:  [phone] name: [facility] SmTamara KenyonMRN: [id number]: [birth year]

## 2021-01-21 ENCOUNTER — Other Ambulatory Visit: Payer: Self-pay

## 2021-01-21 ENCOUNTER — Ambulatory Visit: Payer: Medicare Other

## 2021-01-21 ENCOUNTER — Ambulatory Visit (INDEPENDENT_AMBULATORY_CARE_PROVIDER_SITE_OTHER): Payer: Medicare Other | Admitting: Psychiatry

## 2021-01-21 ENCOUNTER — Encounter: Payer: Self-pay | Admitting: Psychiatry

## 2021-01-21 DIAGNOSIS — F6381 Intermittent explosive disorder: Secondary | ICD-10-CM

## 2021-01-21 DIAGNOSIS — F1021 Alcohol dependence, in remission: Secondary | ICD-10-CM

## 2021-01-21 DIAGNOSIS — F5105 Insomnia due to other mental disorder: Secondary | ICD-10-CM

## 2021-01-21 DIAGNOSIS — F331 Major depressive disorder, recurrent, moderate: Secondary | ICD-10-CM | POA: Diagnosis not present

## 2021-01-21 DIAGNOSIS — F411 Generalized anxiety disorder: Secondary | ICD-10-CM

## 2021-01-21 MED ORDER — LORAZEPAM 1 MG PO TABS
1.0000 mg | ORAL_TABLET | Freq: Every evening | ORAL | 5 refills | Status: DC | PRN
Start: 2021-01-21 — End: 2021-09-24

## 2021-01-21 MED ORDER — SERTRALINE HCL 100 MG PO TABS: 200.0000 mg | ORAL_TABLET | Freq: Every day | ORAL | 3 refills | Status: DC

## 2021-01-21 NOTE — Progress Notes (Signed)
Cody Hines. [id number] 1946-05-09 75 y.o.  Subjective:   Patient ID:  Cody Hines. is a 75 y.o. (DOB Dec 25, 1945) male.  Chief Complaint:  Chief Complaint  Patient presents with  . Follow-up  . Generalized anxiety disorder  . Major depressive disorder, recurrent episode, moderate (HCC)    Depression        Associated symptoms include headaches.  Associated symptoms include no decreased concentration and no suicidal ideas.   Cody Hines. presents to the office today for follow-up mood problems.  Last visit 11/21/19.  No med changes at last visit.  Still on sertraline 200 mg daily.   He's continued counseling.  05/21/20 appt with the following noted: Taking lorazepam 3-4 times weekly for sleep. About the same mentally, but physically back pain and can't do much and not much stamina from the fall he had before.  Fell off ladder while fixing downspout at wife's parents house and fx vertebra and wife took him back to care for him.   95% of time trying to get along with him.  Not losing his temper with her generally.    Able to see gkids more generally except for Covid with son.  Pt been vaccinated but still not good enough for son to see the gkids.   verall is improved but sx are not resolved. Plan no med changes  01/21/21 appt noted: Much better.  PT for 2 mos [facility] to deal with falls. Gotten better Still on sertraline 200 and lorazepam prn.  It is working and satisfied with the meds.  Mood is better.  Some memory problems but it is not markedly worse than when here.  Pending neuropsych testing.  Pt reports that mood is pretty even keel.  Things don't bother him like they used to.  Wife barely speaks to him since March. Anxiety in large groups of people has to get out.  Pt reports has interrupted sleep. Better sleep when not working. Goes to bed early. Trouble staying asleep.  Busy.  sUsually enough sleep.  Pt reports that appetite is  good. Pt reports that energy is poor DT heat. Better than it was.   Concentration is air.. Suicidal thoughts:  denied by patient.  Likes movies.   Less angry and resentful of others than he used to be. Outbursts are generally resolved. He believes he's handling anger better.  Kids see big difference in the way he handles things.  Therapy with [name] CSW helpful. Wife living with daughter.  Drinks caffeinated sodas all day until 9pm.  Sober  in OCT 2018.   Past Psychiatric Medication Trials: citalopram, Wellbutrin, sertraline 200,  Xanax, lorazepam.  Review of Systems:  Review of Systems  Respiratory: Negative for cough.   Musculoskeletal: Positive for back pain and gait problem.  Neurological: Positive for tremors and headaches. Negative for weakness.       Fewer falls.  Psychiatric/Behavioral: Positive for depression. Negative for agitation, behavioral problems, confusion, decreased concentration, dysphoric mood, hallucinations, self-injury, sleep disturbance and suicidal ideas. The patient is nervous/anxious. The patient is not hyperactive.   Increase in migraine HA.  Medications: I have reviewed the patient's current medications.  Current Outpatient Medications  Medication Sig Dispense Refill  . acetaminophen (TYLENOL) 650 MG CR tablet Take 650 mg by mouth every 8 (eight) hours as needed for pain (or back pain or migraines).     Marland Kitchen aspirin EC 81 MG tablet Take 81 mg by mouth daily.    Marland Kitchen  Cholecalciferol (VITAMIN D3) 250 MCG (10000 UT) capsule Take 10,000 Units by mouth daily.    . enalapril (VASOTEC) 20 MG tablet Take  1 tablet  Daily  for BP 90 tablet 1  . Flaxseed, Linseed, (FLAX SEED OIL) 1000 MG CAPS Take 2,000 mg by mouth daily.     Marland Kitchen LORazepam (ATIVAN) 1 MG tablet Take 1 tablet (1 mg total) by mouth at bedtime as needed for sleep. As needed 30 tablet 5  . Magnesium 500 MG TABS Take 500 mg by mouth daily.     . rosuvastatin (CRESTOR) 40 MG tablet Take  1 tablet  Daily   For  Cholesterol 90 tablet 0  . sertraline (ZOLOFT) 100 MG tablet Take 2 tablets (200 mg total) by mouth daily. 180 tablet 2   No current facility-administered medications for this visit.    Medication Side Effects: None  Allergies:  Allergies  Allergen Reactions  . Asa [Aspirin] Diarrhea, Nausea Only and Other (See Comments)    History of ulcers, also  . Atorvastatin Other (See Comments)    Muscle aches  . Celebrex [Celecoxib] Other (See Comments)    Headaches   . Codeine Nausea And Vomiting  . Savella [Milnacipran Hcl] Other (See Comments)    Reaction not recalled  . Viagra [Sildenafil Citrate] Other (See Comments)    Headaches  . Penicillins Rash and Other (See Comments)    Past Medical History:  Diagnosis Date  . BPH (benign prostatic hyperplasia)   . Fibromyalgia   . GERD (gastroesophageal reflux disease)   . Hyperlipidemia   . Hypertension   . Hypogonadism male   . IBS (irritable bowel syndrome)   . OSA (obstructive sleep apnea)     Family History  Problem Relation Age of Onset  . Cancer Mother        breast  . Cancer Father        lung  . Diabetes Father   . Heart disease Sister   . Arthritis Sister     Social History   Socioeconomic History  . Marital status: Divorced    Spouse name: Kennyth Lose  . Number of children: Not on file  . Years of education: Not on file  . Highest education level: Not on file  Occupational History  . 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 Smoking status: Former Smoker    Years: 30.00    Types: Cigarettes    Quit date: 10/06/1980    Years since quitting: 40.3  . Smokeless tobacco: Never Used  Vaping Use  . Vaping Use: Never used  Substance and Sexual Activity  . Alcohol use: No    Comment: Not drinking x 1 month  . Drug use: No  . Sexual activity: Not Currently  Other Topics Concern  . Not on file  Social History Narrative  . Not on file   Social Determinants of Health   Financial Resource Strain: Not on file   Food Insecurity: Not on file  Transportation Needs: Not on file  Physical Activity: Not on file  Stress: Not on file  Social Connections: Not on file  Intimate Partner Violence: Not on file    Past Medical History, Surgical history, Social history, and Family history were reviewed and updated as appropriate.   Please see review of systems for further details on the patient's review from today.   Objective:   Physical Exam:  There were no vitals taken for this visit.  Physical Exam Constitutional:  General: He is not in acute distress.    Appearance: He is well-developed.  Musculoskeletal:        General: No deformity.  Neurological:     Mental Status: He is alert and oriented to person, place, and time.     Motor: No tremor.     Coordination: Coordination normal.     Gait: Gait normal.  Psychiatric:        Attention and Perception: He is attentive. He does not perceive auditory hallucinations.        Mood and Affect: Mood is not anxious or depressed. Affect is not labile, blunt, angry or inappropriate.        Speech: Speech normal.        Behavior: Behavior normal.        Thought Content: Thought content normal. Thought content is not paranoid. Thought content does not include homicidal or suicidal ideation. Thought content does not include homicidal or suicidal plan.        Cognition and Memory: Cognition normal.        Judgment: Judgment normal.     Comments: Insight intact. No auditory or visual hallucinations.  Overall less irritable and angry with people. Less blunted, more humor. Talkative.     Lab Review:     Component Value Date/Time   NA 142 01/09/2021 0959   K 4.4 01/09/2021 0959   CL 104 01/09/2021 0959   CO2 32 01/09/2021 0959   GLUCOSE 91 01/09/2021 0959   BUN 10 01/09/2021 0959   CREATININE 1.42 (H) 01/09/2021 0959   CALCIUM 9.5 01/09/2021 0959   CALCIUM 9.5 01/09/2021 0959   PROT 6.3 01/09/2021 0959   ALBUMIN 3.9 06/22/2019 2128   AST 35  01/09/2021 0959   ALT 21 01/09/2021 0959   ALKPHOS 93 06/22/2019 2128   BILITOT 1.1 01/09/2021 0959   GFRNONAA 49 (L) 01/09/2021 0959   GFRAA 57 (L) 01/09/2021 0959       Component Value Date/Time   WBC 5.8 01/09/2021 0959   RBC 4.18 (L) 01/09/2021 0959   HGB 12.6 (L) 01/09/2021 0959   HCT 38.3 (L) 01/09/2021 0959   PLT 117 (L) 01/09/2021 0959   MCV 91.6 01/09/2021 0959   MCH 30.1 01/09/2021 0959   MCHC 32.9 01/09/2021 0959   RDW 13.9 01/09/2021 0959   LYMPHSABS 1,995 01/09/2021 0959   MONOABS 0.3 06/22/2019 2128   EOSABS 110 01/09/2021 0959   BASOSABS 29 01/09/2021 0959    No results found for: POCLITH, LITHIUM   No results found for: PHENYTOIN, PHENOBARB, VALPROATE, CBMZ   .res Assessment: Plan:    Bacilio was seen today for follow-up, generalized anxiety disorder and major depressive disorder, recurrent episode, moderate (hcc).  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 We discussed his chronic depression and anxiety and irritability, anger.  He's markedly better but has residual symptoms.  Most of mood problem is situational.   Benefit from meds and therapy. No change in therapy indicated.  Needs to continue the meds bc of a high relapse risk.  Supportive therapy re: dealing with stress of separation. Doesn't look like things will change.  Option potentiate with something but defer.  Not likely to help  bc it appears mostly situational.  Disc caffeine and sleep.  Takes it up to 9 pm.  Rec avoid late caffeine.  We discussed the short-term risks associated with benzodiazepines including sedation and increased fall risk among others.  Discussed long-term  side effect risk including dependence, potential withdrawal symptoms, and the potential eventual dose-related risk of dementia.  No evidence of abuse.Take LED lorazepam.  Used less since here.  Continue sertraline 200.  Disc sobriety maintenance.   Benefit from therapy.  No med changes indicated.  This appointment was 15 minutes  FU 6 mos.  Lynder Parents, MD, DFAPA   Please see After Visit Summary for patient specific instructions.  Future Appointments  Date Time Provider [place]  01/25/2021  8:30 AM Alphonzo Severance [facility] None  01/25/2021  9:30 AM [facility] NEUROPSYCH TECH2 [facility] None  01/28/2021  8:00 AM Shanon Ace, LCSW [facility] None  02/01/2021 10:00 AM Alphonzo Severance [facility] None  03/28/2021  9:00 AM Ward Givens, NP [facility] None  09/26/2021  9:00 AM Liane Comber, NP [facility] None  11/14/2021  9:30 AM Ward Givens, NP [facility] None  01/09/2022 10:00 AM [name], MD [facility] None    No orders of the defined types were placed in this encounter.     -------------------------------

## 2021-01-22 ENCOUNTER — Other Ambulatory Visit: Payer: Self-pay | Admitting: Internal Medicine

## 2021-01-22 DIAGNOSIS — E782 Mixed hyperlipidemia: Secondary | ICD-10-CM

## 2021-01-23 ENCOUNTER — Ambulatory Visit: Payer: Medicare Other

## 2021-01-25 ENCOUNTER — Other Ambulatory Visit: Payer: Self-pay

## 2021-01-25 ENCOUNTER — Ambulatory Visit: Payer: Medicare Other

## 2021-01-25 ENCOUNTER — Ambulatory Visit (INDEPENDENT_AMBULATORY_CARE_PROVIDER_SITE_OTHER): Payer: Medicare Other | Admitting: Counselor

## 2021-01-25 ENCOUNTER — Encounter: Payer: Self-pay | Admitting: Counselor

## 2021-01-25 DIAGNOSIS — R2689 Other abnormalities of gait and mobility: Secondary | ICD-10-CM | POA: Diagnosis not present

## 2021-01-25 DIAGNOSIS — F09 Unspecified mental disorder due to known physiological condition: Secondary | ICD-10-CM

## 2021-01-25 DIAGNOSIS — G3184 Mild cognitive impairment, so stated: Secondary | ICD-10-CM | POA: Diagnosis not present

## 2021-01-25 NOTE — Progress Notes (Signed)
   Psychometrist Note   Cognitive testing was administered to Cody Hines. by Lamar Benes, B.S. (Technician) under the supervision of Alphonzo Severance, Psy.D., ABN. Cody Hines was able to tolerate all test procedures. Dr. Nicole Kindred met with the patient as needed to manage any emotional reactions to the testing procedures. Rest breaks were offered.    The battery of tests administered was selected by Dr. Nicole Kindred with consideration to the patient's current level of functioning, the nature of his symptoms, emotional and behavioral responses during the interview, level of literacy, observed level of motivation/effort, and the nature of the referral question. This battery was communicated to the psychometrist. Communication between Dr. Nicole Kindred and the psychometrist was ongoing throughout the evaluation and Dr. Nicole Kindred was immediately accessible at all times. Dr. Nicole Kindred provided supervision to the technician on the date of this service, to the extent necessary to assure the quality of all services provided.    Cody Hines will return in approximately one week for an interactive feedback session with Dr. Nicole Kindred, at which time test performance, clinical impressions, and treatment recommendations will be reviewed in detail. The patient understands he can contact our office should he require our assistance before this time.   A total of 120 minutes of billable time were spent with Cody Hines. by the technician, including test administration and scoring time. Billing for these services is reflected in Dr. Les Pou note.   This note reflects time spent with the psychometrician and does not include test scores, clinical history, or any interpretations made by Dr. Nicole Kindred. The full report will follow in a separate note.

## 2021-01-25 NOTE — Progress Notes (Signed)
[facility]  Patient Name: Cody Hines. MRN: [id number] Date of Birth: April 30, 1946 Age: 75 y.o. Education: 14 years  Measurement properties of test scores: IQ, Index, and Standard Scores (SS): Mean = 100; Standard Deviation = 15 Scaled Scores (Ss): Mean = 10; Standard Deviation = 3 Z scores (Z): Mean = 0; Standard Deviation = 1 T scores (T); Mean = 50; Standard Deviation = 10  TEST SCORES:    Note: This summary of test scores accompanies the interpretive report and should not be interpreted by unqualified individuals or in isolation without reference to the report. Test scores are relative to age, gender, and educational history as available and appropriate.   Performance Validity        "A" Random Letter Test Raw  Descriptor      Errors 0 Within Expectation  The Dot Counting Test: 17 Below Expectation      Embedded Measures: Raw  Descriptor      NAB Effort Index 1 Within Expectation      Expected Functioning        Wide Range Achievement Test: Standard/Scaled Score Percentile      Word Reading 87 19      Attention/Processing Speed        Neuropsychological Assessment Battery (Attention Module, Form 1): Scaled/T-score Percentile      Digits Forward 40 16      Digits Backwards 49 46      Dots 47 38      Driving Scenes 35 7      Repeatable Battery for the Assessment of Neuropsychological Status (Form A): Standard Score Percentile     Coding 3 1      Language        Neuropsychological Assessment Battery T Score Percentile      Naming   (31) 58 79      Verbal Fluency:  T Score Percentile      Controlled Oral Word Association (F-A-S) 43 25      Semantic Fluency (Animals) 45 31      Memory:        Neuropsychological Assessment Battery (Memory Module, Form 1): T-score/Standard Score Percentile  Memory Index (MEM): 82 12      List Learning           List A Immediate Recall   (3 , 4 , 11) 45 31         List B  Immediate Recall   (2) 38 12         List A Short Delayed Recall   (7) 53 62         List A Long Delayed Recall   (7) 54 66         List A Percent Retention   (100 %) --- 58         List A Long Delayed Yes/No Recognition Hits   (11) --- 58         List A Long Delayed Yes/No Recognition False Alarms   (3) --- 62         List A Recognition Discriminability Index --- 66      Story Learning           Immediate Recall   (17, 30) 40 16         Delayed Recall   (30) 50 50         Percent Retention   (100 %) --- 62  Daily Living Memory            Immediate Recall   (18, 16) 40 16          Delayed Recall   (8, 7) 57 75          Percent Retention (100 %) --- 84          Recognition Hits   (9) --- 62      Repeatable Battery for the Assessment of Neuropsychological Status (Form A): Scaled Score Percentile         Figure Recall   (9) 8 25      Visuospatial/Constructional Functioning        Repeatable Battery for the Assessment of Neuropsychological Status (Form A): Standard/Scaled Score Percentile      Visuospatial/Constructional Index 69 2         Figure Copy   (15) 5 5         Judgment of Line Orientation   (10) --- 3-9      Executive Functioning        [employer] Test (MWCST): Standard/T-Score Percentile      Number of Categories Correct 33 5      Number of Perseverative Errors 31 3      Number of Total Errors 35 7      Percent Perseverative Errors 43 25  Executive Function Composite 70 2      Trail Making Test: T-Score Percentile      Part A 30 2      Part B 39 14      [place] Diagnostic Aphasia Exam: Raw Score Scaled Score      Complex Ideational Material 11 9      Rating Scales         Raw Score Descriptor  Patient Health Questionnaire - 9 12 Moderate  GAD-7 8 Mild      Clinical Dementia Rating Raw Score Descriptor      Sum of Boxes 1.5 Mild Cognitive Impairment      Global Score 0.5 MCI      Quick Dementia Rating System Raw Score Descriptor       Sum of Boxes 2.5 Very Mild Dementia      Total Score 5 MCI   Elanore Talcott V. Nicole Kindred PsyD, [place] Clinical Neuropsychologist
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
## 2021-01-28 ENCOUNTER — Other Ambulatory Visit: Payer: Self-pay

## 2021-01-28 ENCOUNTER — Ambulatory Visit (INDEPENDENT_AMBULATORY_CARE_PROVIDER_SITE_OTHER): Payer: Medicare Other | Admitting: Psychiatry

## 2021-01-28 DIAGNOSIS — F411 Generalized anxiety disorder: Secondary | ICD-10-CM

## 2021-01-28 NOTE — Progress Notes (Signed)
[facility]  Patient Name: Trasean Philmore Jacarie Pate. MRN: [id number] Date of Birth: 10-Jul-1946 Age: 75 y.o. Education: 14 years  Clinical Impressions  Selma Philmore Jerauld Bostwick. is a 75 y.o., right-hand dominant, separated man with a history of HTN, hypercholesterolemia, OSA, psychiatric difficulties and gait disturbance. He recently consulted with Dr. Brett Fairy who thought he had multifactorial gait dysfunction but also that Parkinson's should be considered and referred him for neuropsychological evaluation. He and his son notice mainly day-to-day forgetfulness and absentmindedness but no significant problems in terms of functional decline. These difficulties started around 3 years ago and have been an "ongoing" problem since then. He has an MRI that shows nonspecific volume loss although there is a fair degree of asymmetry showing in the lateral ventricles with L > R dilatation.   On neuropsychological evaluation, Mr. Gangemi demonstrated mild difficulties with respect to frontal-subcortical abilities including on measures of executive function and processing speed. He may have some additional "cortical" findings in terms of diminished visuospatial abilities although they are subtle at this point. He did well on measures of memory particularly with respect to retention of information over time. He screened in the moderate range for depression and the mild range for anxiety symptoms. His son rated him as functioning at a very mild dementia to MCI level of function and his CDR places him in the MCI range.   Mr. Reim is thus demonstrating a mild cognitive impairment level problem involving changes in frontal-subcortical abilities and perhaps also some mild visuospatial symptoms. He should be followed over time. Etiology of his symptoms is not yet clear, although an underlying condition is certainly within the differential. The pattern of findings is not  consistent with expectations in prodromal Alzheimer's disease, as memory represented an area of strength for this patient. Ongoing difficulties from OSA, affective concerns, and cumulative effects of stress may also be contributory. I will discuss with him brain health strategies for minimizing any further decline. He is already assertively treating his psychiatric issues and is using a CPAP for his OSA. He should follow up for reevaluation in 1 to 2 years.    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 I would like to highlight that at this point, the difficulties appear to be quite mild, but they are still enough to corroborate your impression that some changes has occurred. The best diagnosis considering all the information is mild cognitive impairment.   The major difference between mild cognitive impairment (MCI) and dementia is in severity and potential prognosis. Once someone reaches a level of severity adequate to be diagnosed with a dementia, there is usually progression over time, though this may be years. On the other hand, mild cognitive impairment, while a significant risk for dementia in future, does not always progress to dementia, and in some instances stays the same or can even revert to normal. It is important to realize that if MCI is due to underlying Alzheimer's disease, it will most likely progress to dementia eventually. The rate of conversion to Alzheimer's dementia from amnestic MCI is about 15% per year versus the general population risk of conversion of 2% per year.   In your case, I am not entirely sure what is causing your mild cognitive impairment. This certainly could be due to an  underlying condition affecting the  brain but it may also be due to reversible causes. I would encourage you to simply view it as a risk factor for decline, to implement healthy lifestyle changes, and to keep up the excellent work following with your mental health professionals.   Depression can affect cognitive functioning in several ways. For one, there are neurobiological changes in depression that can contribute to attention, concentration, and memory problems. These changes are so common they are part of the criteria we use to diagnose depressive disorders. Depression can also negatively impact your own appraisal of your cognitive abilities, leading you to feel like you are performing more poorly than is objectively warranted. I am glad that you are getting the treatment you need and suggest that you continue.   There is now good quality evidence from at least one large scale study that a modified mediterranean diet may help slow cognitive decline. This is known as the "MIND" diet. The Mind diet is not so much a specific diet as it is a set of recommendations for things that you should and should not eat.   Foods that are ENCOURAGED on the MIND Diet:  Green, leafy vegetables: Aim for six or more servings per week. This includes kale, spinach, cooked greens and salads.  All other vegetables: Try to eat another vegetable in addition to the green leafy vegetables at least once a day. It is best to choose non-starchy vegetables because they have a lot of nutrients with a low number of calories.  Berries: Eat berries at least twice a week. There is a plethora of research on strawberries, and other berries such as blueberries, raspberries and blackberries have also been found to have antioxidant and brain health benefits.  Nuts: Try to get five servings of nuts or more each week. The creators of the [place] don't specify what kind of nuts to consume, but it is probably best to vary the type of nuts you eat to obtain a variety of nutrients.  Peanuts are a legume and do not fall into this category.  Olive oil: Use olive oil as your main cooking oil. There may be other heart-healthy alternatives such as algae oil, though there is not yet sufficient research upon which to base a formal recommendation.  Whole grains: Aim for at least three servings daily. Choose minimally processed grains like oatmeal, quinoa, brown rice, whole-wheat pasta and 100% whole-wheat bread.  Fish: Eat fish at least once a week. It is best to choose fatty fish like salmon, sardines, trout, tuna and mackerel for their high amounts of omega-3 fatty acids.  Beans: Include beans in at least four meals every week. This includes all beans, lentils and soybeans.  Poultry: Try to eat chicken or [place] at least twice a week. Note that fried chicken is not encouraged on the MIND diet.  Wine: Aim for no more than one glass of alcohol daily. Both red and white wine may benefit the brain. However, much research has focused on the red wine compound resveratrol, which may help protect against Alzheimer's disease.  Foods that are DISCOURAGED on the MIND Diet: Butter and margarine: Try to eat less than 1 tablespoon (about 14 grams) daily. Instead, try using olive oil as your primary cooking fat, and dipping your bread in olive oil with herbs.  Cheese: The MIND diet recommends limiting your cheese consumption to less than once per week.  Red meat: Aim for no more than three servings each week. This  includes all beef, pork, lamb and products made from these meats.  [place] food: The MIND diet highly discourages fried food, especially the kind from fast-food restaurants. Limit your consumption to less than once per week.  Pastries and sweets: This includes most of the processed junk food and desserts you can think of. Ice cream, cookies, brownies, snack cakes, donuts, candy and more. Try to limit these to no more than four times a week.  Exercise is one of the best medicines for promoting  health and maintaining cognitive fitness at all stages in life. Exercise probably has the largest documented effect on brain health and performance of any lifestyle intervention. Studies have shown that even previously sedentary individuals who start exercising as late as age 61 show a significant survival benefit as compared to their non-exercising peers. In the [place], the current guidelines are for 30 minutes of moderate exercise per day, but increasing your activity level less than that may also be helpful. You do not have to get your 30 minutes of exercise in one shot and exercising for short periods of time spread throughout the day can be helpful. Go for several walks, learn to dance, or do something else you enjoy that gets your body moving. Of course, if you have an underlying medical condition or there is any question about whether it is safe for you to exercise, you should consult a medical treatment provider prior to beginning exercise.   I think it is excellent that you are working on your gait problems in PT and suggest that you continue to do so and adhere diligently to their recommendations for out of session work on your own.   You should return for reevaluation in 1 to 2 years time. If this is an underlying condition, it will become clear in time.   Test Findings  Test scores are summarized in additional documentation associated with this encounter. Test scores are relative to age, gender, and educational history as available and appropriate. There were no concerns about performance validity as all findings fell within normal expectations.   General Intellectual Functioning/Achievement:  Performance on single word reading was low average, which presents as a reasonable standard of comparison for the patient's cognitive test data.   Attention and Processing Efficiency: Indicators of attention and working memory were reasonable albeit with a low score. Digit repetition forward was  average and digit repetition backward was high average. Identifying changes to a series of printed stimulus materials was average for dot patterns but he had difficulty and scored in the unusually low range for driving scenes.   Measures of processing efficiency/speed generated mainly low scores with extremely low timed number-symbol coding and unusually low simple numeric sequencing (the latter is a very easy task).   Language: Language indicators showed visual object confrontation naming within normal limits. Generation of words was low average in response to the letters F-A-S. Generation of animals in one minute was average. He performed at an unusually low level on sentence repetition, which may reflect inattentiveness or hearing problems.   Visuospatial Function: Performance on visuospatial and constructional measures was below expectation with an extremely low overall performance level. He demonstrated unusually low figure copy and judgment of angular line orientations.   Learning and Memory: Performance on measures of learning and memory was generally an area of strength for Mr. Shober who demonstrated low average overall performance and good retention of information across time.   In the verbal realm, he learned 3, 4, and  11 words of a 12-item word list across three learning trials. This shows low single trial learning yet good learning curve and generated an average score. Delayed recall was good with 7 words on both short and long delay conditions, which is average. Yes/no recognition discriminability for targets vs. False choices was average. Short story learning was low average on immediate recall with average delayed recall. Memory for brief daily living type information was low average with high average delayed recall. His recognition disriminability for the information amongst false choices was average.   In the visual realm, delayed recall for a modestly complex figure was average.    Executive Functions: Performance on executive measures was variable. He performed at an unusually low level on the Executive Function Composite of the Modified [employer]. His scores were unusually low for both perseverative errors and for categories completed. Alternating sequencing of numbers and letters of the alphabet was low average. Generation of words in response to the letters F-A-S was low average.   Rating Scale(s): On self-report inventories of symptoms Mr. Caporale reported a moderate level of depressive problems and a mild yet potentially clinically significant level of difficulties with respect to anxiety. His son characterized him as functioning at a mild cognitive impairment to very mild dementia level problem. I was able to rate a CDR for him and would place him at an MCI level at most.   Viviano Simas. Nicole Kindred, PsyD, ABN Clinical Neuropsychologist  Coding and Compliance  Billing below reflects technician time, my direct face-to-face time with the patient, time spent in test administration, and time spent in professional activities including but not limited to: neuropsychological test interpretation, integration of neuropsychological test data with clinical history, report preparation, treatment planning, care coordination, and review of diagnostically pertinent medical history or studies.   Services associated with this encounter: 91 minutes (96116/96121 Neurobehavioral Status Exam) plus 120 minutes (96132/96133; Neuropsychological Evaluation by Professional)  120 minutes (96138/96139; Neuropsychological Testing by Technician)

## 2021-01-28 NOTE — Progress Notes (Signed)
[facility] Counselor/Therapist Progress Note  Patient ID: Warrick Philmore Guled Gahan., MRN: [id number],    Date: 01/28/2021  Time Spent: 50 minutes   8:00am to 8:50am  Treatment Type: Individual Therapy  Reported Symptoms: anxiety, some depression  Mental Status Exam:  Appearance:   Casual     Behavior:  Appropriate, Sharing and Motivated  Motor:  "not as steady on my feet but haven't fallen anymore since last appt."  Speech/Language:   Normal Rate  Affect:  Depressed and anxious  Mood:  anxious and depressed  Thought process:  normal  Thought content:    some ruminating  Sensory/Perceptual disturbances:    WNL  Orientation:  oriented to person, place, time/date, situation, day of week, month of year and year  Attention:  Good  Concentration:  Fair  Memory:  some forgetting and worse under stress  Fund of knowledge:   Good and Fair  Insight:    Fair  Judgment:   Good and Fair  Impulse Control:  Good   Risk Assessment: Danger to Self:  No Self-injurious Behavior: No Danger to Others: No Duty to Warn:no Physical Aggression / Violence:No  Access to Firearms a concern: No  Gang Involvement:No   Subjective: Patient today reporting anxiety as main symptom, along with some depression.  (See "Progress" not below.)  Interventions: Solution-Oriented/Positive Psychology and Ego-Supportive  Diagnosis:   ICD-10-CM   1. Generalized anxiety disorder  F41.1      Plan: Patient is not signing tx goals on computer screen due to [place].  Treatment Goals: Goals remain on tx plan as patient works on strategies to achieve his goals. Progress is documented each session in the "Progress" section of Plan.   Long term goal: Develop healthy cognitive patterns and beliefs about self and the world that lead to alleviation and help prevent relapse of depression.  Short term goal: Identify and replace depressive thinking that leads to depressive feelings and actions.He will  replace the depressive thinking with more positive, hopeful thoughts that can lead to more positive and hopeful feelings.  Strategy: Patient to more consistently monitor his thoughts and catch the depressive/negative thoughts, trying to change them to positiveand more reality-based thought patterns.Look at his negative thoughts and weigh them against the evidence.  PROGRESS: Patient in today reporting anxiety as main symptom, along with some depression. Has been involved with neurological eval recently and his son attended also. Patient to return to get results of eval end of this week. Staying in good contact with adult children and grandchildren. Some progress in not as overly focusing on negativity with separated wife. Spoke about several things before he ever mentioned separated wife, where there is still friction and some ill feelings. Difficult for him to stay on topic and tends to end up in ruminating some about various subjects. Processed some feelings re: recent family deaths and some tension with him and separated wife but also a couple of uneventful interactions with her. Does struggle with sometimes dwelling on past marital issues, and challenges he faces in aging. Reviewed re-direction of negative/anxious thoughts from prior session and patient acknowledges it's hard to "catch them sometimes."  Patient seems more aware of his own personal safety issues and this has helped him exercise caution more consistently including not letting his large dog jump on him, being careful as he rises from a seated position, and being more aware of his surroundings when walking.  Encouraged patient's frequent family interaction, looking for and focusing on  more of the positives and the negatives daily, maintaining contact with supportive longtime friends, getting outside daily as he is able, to use his cane when needed for stability (was not needed today), to remain on meds as prescribed, and accepting of his  son's help in managing his affairs as he encounters more aging issues.  Goal review and progress/challenges noted with patient.  Next appointment within 3 weeks.   Shanon Ace, LCSW

## 2021-01-31 ENCOUNTER — Ambulatory Visit: Payer: Medicare Other | Admitting: Psychiatry

## 2021-02-01 ENCOUNTER — Encounter: Payer: Self-pay | Admitting: Counselor

## 2021-02-01 ENCOUNTER — Other Ambulatory Visit: Payer: Self-pay

## 2021-02-01 ENCOUNTER — Ambulatory Visit (INDEPENDENT_AMBULATORY_CARE_PROVIDER_SITE_OTHER): Payer: Medicare Other | Admitting: Counselor

## 2021-02-01 DIAGNOSIS — F329 Major depressive disorder, single episode, unspecified: Secondary | ICD-10-CM | POA: Diagnosis not present

## 2021-02-01 DIAGNOSIS — G3184 Mild cognitive impairment, so stated: Secondary | ICD-10-CM

## 2021-02-01 DIAGNOSIS — F32A Depression, unspecified: Secondary | ICD-10-CM

## 2021-02-01 NOTE — Progress Notes (Signed)
NEUROPSYCHOLOGY FEEDBACK NOTE Cody Hines  Feedback Note: I met with Cody Hines Cody Hines. to review the findings resulting from his neuropsychological evaluation. Since the last appointment, he has been about the same. Time was spent reviewing the impressions and recommendations that are detailed in the evaluation report. We discussed impression of mild cognitive impairment, although in his case I do not think this represents prodromal Alzheimer's. Findings would go better with a Parkinson's type condition. I discussed healthy lifestyle changes with him, encouraged him to relax because his issues are quite mild, and we talked about compensatory strategies. Other recommendations as reflected in the patient instructions. I took time to explain the findings and answer all the patient's questions. I encouraged Cody Hines to contact me should he have any further questions or if further follow up is desired.   Current Medications and Medical History   Current Outpatient Medications  Medication Sig Dispense Refill  . acetaminophen (TYLENOL) 650 MG CR tablet Take 650 mg by mouth every 8 (eight) hours as needed for pain (or back pain or migraines).     Marland Kitchen aspirin EC 81 MG tablet Take 81 mg by mouth daily.    . Cholecalciferol (VITAMIN D3) 250 MCG (10000 UT) capsule Take 10,000 Units by mouth daily.    . enalapril (VASOTEC) 20 MG tablet Take  1 tablet  Daily  for BP 90 tablet 1  . Flaxseed, Linseed, (FLAX SEED OIL) 1000 MG CAPS Take 2,000 mg by mouth daily.     Marland Kitchen LORazepam (ATIVAN) 1 MG tablet Take 1 tablet (1 mg total) by mouth at bedtime as needed for sleep. As needed 30 tablet 5  . Magnesium 500 MG TABS Take 500 mg by mouth daily.     . rosuvastatin (CRESTOR) 40 MG tablet Take  1 tablet  Daily  for Cholesterol 90 tablet 3  . sertraline (ZOLOFT) 100 MG tablet Take 2 tablets (200 mg total) by mouth daily. 180 tablet 3   No current facility-administered medications for this visit.     Patient Active Problem List   Diagnosis Date Noted  . Small right kidney 04/05/2020  . Aortic atherosclerosis ([place]) by CT Scan on 06/22/2019 04/04/2020  . At moderate risk for fall 09/27/2019  . Compression fracture of T12 vertebra ([place]) 09/27/2019  . Hyperlipidemia, mixed 09/26/2019  . Personal history of skin cancer 09/26/2019  . CKD (chronic kidney disease) stage 3, GFR 30-59 ml/min ([facility]) 03/23/2019  . Major depressive disorder, recurrent episode, moderate ([place]) 07/19/2018  . Chronic lumbar pain 03/12/2018  . Benign essential tremor 12/19/2016  . Encounter for Medicare annual wellness exam 08/10/2015  . Medication management 05/03/2014  . Vitamin D deficiency 05/03/2014  . Essential hypertension   . GERD (gastroesophageal reflux disease)   . IBS (irritable bowel syndrome)   . OSA on CPAP   . Fibromyalgia   . BPH (benign prostatic hyperplasia)   . Testosterone deficiency     Mental Status and Behavioral Observations  Per Hines Darey Hershberger. presented on time to the present encounter and was alert and generally oriented. Speech was normal in rate, rhythm, volume, and prosody. Self-reported mood was "allright" and affect was neutral. Thought process was meandering though not frankly disorganized and thought content was appropriate to the topics discussed. There were no safety concerns identified at today's encounter, such as thoughts of harming self or others.   Plan  Feedback provided regarding the patient's neuropsychological evaluation. He has very mild neurocognitive disorder, which could certainly  be due to something like PD. Also possible that his MCI is due to reversible causes, like OSA, depression, poor sleep, etc. He should be followed over time. Cody Hines Howald Jr. was encouraged to contact me if any questions arise or if further follow up is desired.    V. , PsyD, ABN Clinical Neuropsychologist  Service(s) Provided at This Encounter: 35  minutes ([id number]; Psychotherapy with patient/family)   

## 2021-02-01 NOTE — Patient Instructions (Signed)
Your performance and presentaiton on neuropsychological assessment were consistent with some very mild declines on measures of processing speed, executive function, and also visual-spatial abilities. I would like to highlight that at this point, the difficulties appear to be quite mild, but they are still enough to corroborate your impression that some changes has occurred. The best diagnosis considering all the information is mild cognitive impairment.   The major difference between mild cognitive impairment (MCI) and dementia is in severity and potential prognosis. Once someone reaches a level of severity adequate to be diagnosed with a dementia, there is usually progression over time, though this may be years. On the other hand, mild cognitive impairment, while a significant risk for dementia in future, does not always progress to dementia, and in some instances stays the same or can even revert to normal.It is important to realize that if MCI is due to underlying Alzheimer's disease, it will most likely progress to dementia eventually. The rate of conversion to Alzheimer's dementiafrom amnestic MCI is about 15% per year versus the general population risk of conversion of 2% per year.  In your case, I am not entirely sure what is causing your mild cognitive impairment. This certainly could be due to an underlying condition affecting the brain but it may also be due to reversible causes. I would encourage you to simply view it as a risk factor for decline, to implement healthy lifestyle changes, and to keep up the excellent work following with your mental health professionals.   Depression can affect cognitive functioning in several ways. For one, there are neurobiological changes in depression that can contribute to attention, concentration, and memory problems. These changes are so common they are part of the criteria we use to diagnose depressive disorders. Depression can also negatively impact your  own appraisal of your cognitive abilities, leading you to feel like you are performing more poorly than is objectively warranted. I am glad that you are getting the treatment you need and suggest that you continue.   There is now good quality evidence from at least one large scale study that a modified mediterranean diet may help slow cognitive decline. This is known as the "MIND" diet. The Mind diet is not so much a specific diet as it is a set of recommendations for things that you should and should not eat.   Foods that are ENCOURAGED on the MIND Diet:  Green, leafy vegetables: Aim for six or more servings per week. This includes kale, spinach, cooked greens and salads.  All other vegetables: Try to eat another vegetable in addition to the green leafy vegetables at least once a day. It is best to choose non-starchy vegetables because they have a lot of nutrients with a low number of calories.  Berries: Eat berries at least twice a week. There is a plethora of research on strawberries, and other berries such as blueberries, raspberries and blackberries have also been found to have antioxidant and brain health benefits.  Nuts: Try to get five servings of nuts or more each week. The creators of the [place] don't specify what kind of nuts to consume, but it is probably best to vary the type of nuts you eat to obtain a variety of nutrients. Peanuts are a legume and do not fall into this category.  Olive oil: Use olive oil as your main cooking oil. There may be other heart-healthy alternatives such as algae oil, though there is not yet sufficient research upon which to base  a formal recommendation.  Whole grains: Aim for at least three servings daily. Choose minimally processed grains like oatmeal, quinoa, brown rice, whole-wheat pasta and 100% whole-wheat bread.  Fish: Eat fish at least once a week. It is best to choose fatty fish like salmon, sardines, trout, tuna and mackerel for their high amounts of  omega-3 fatty acids.  Beans: Include beans in at least four meals every week. This includes all beans, lentils and soybeans.  Poultry: Try to eat chicken or [place] at least twice a week. Note that fried chicken is not encouraged on the MIND diet.  Wine: Aim for no more than one glass of alcohol daily. Both red and white wine may benefit the brain. However, much research has focused on the red wine compound resveratrol, which may help protect against Alzheimer's disease.  Foods that are DISCOURAGED on the MIND Diet: Butter and margarine: Try to eat less than 1 tablespoon (about 14 grams) daily. Instead, try using olive oil as your primary cooking fat, and dipping your bread in olive oil with herbs.  Cheese: The MIND diet recommends limiting your cheese consumption to less than once per week.  Red meat: Aim for no more than three servings each week. This includes all beef, pork, lamb and products made from these meats.  [place] food: The MIND diet highly discourages fried food, especially the kind from fast-food restaurants. Limit your consumption to less than once per week.  Pastries and sweets: This includes most of the processed junk food and desserts you can think of. Ice cream, cookies, brownies, snack cakes, donuts, candy and more. Try to limit these to no more than four times a week.  Exercise is one of the best medicines for promoting health and maintaining cognitive fitness at all stages in life. Exercise probably has the largest documented effect on brain health and performance of any lifestyle intervention. Studies have shown that even previously sedentary individuals who start exercising as late as age 67 show a significant survival benefit as compared to their non-exercising peers. In the [place], the current guidelines are for 30 minutes of moderate exercise per day, but increasing your activity level less than that may also be helpful. You do not have to get your 30 minutes of exercise  in one shot and exercising for short periods of time spread throughout the day can be helpful. Go for several walks, learn to dance, or do something else you enjoy that gets your body moving. Of course, if you have an underlying medical condition or there is any question about whether it is safe for you to exercise, you should consult a medical treatment provider prior to beginning exercise.   I think it is excellent that you are working on your gait problems in PT and suggest that you continue to do so and adhere diligently to their recommendations for out of session work on your own.   You should return for reevaluation in 1 to 2 years time. If this is an underlying condition, it will become clear in time.

## 2021-02-13 ENCOUNTER — Telehealth: Payer: Self-pay | Admitting: Neurology

## 2021-02-13 NOTE — Telephone Encounter (Signed)
Pt's son(on DPR ) is asking for a call from RN to discuss the plan of care for pt.  He is aware that pt has upcoming appointment on next month he would like to discuss any recent results that may have come from [place] also

## 2021-02-13 NOTE — Telephone Encounter (Signed)
Called the son back to discuss his concerns.  He had spoke with his father and just felt he needed to follow-up with what all has been done through our office and what the next steps are.  Reviewed the visit notes from the February visit.  Advised at that time we completed MRI images as well as referred to neurocognitive and physical therapy.  Patient has completed his physical therapy in April according to the notes on file.  Patient had his MRIs completed and overall there was nothing concerning.  Reviewed the MRIs in more detail with the patient's son, he verbalized understanding. Advised that the follow-up visit that is scheduled for June will allow the nurse practitioner to review the MRIs a little further as well as reviewed the notes from the neuropsychologist along with the memory exam completed on the day of the appointment.  With looking at all that information it will allow them to decide what the next course of action is.  At this time the patient is not on medication.  Depending on the results from the neurocognitive test and the memory testing at the time of the appointment will determine if medication needs to be started.  Patient's son verbalized all this and understood the plan.  He had no further questions at this time.  Was appreciative for the call.

## 2021-02-21 ENCOUNTER — Other Ambulatory Visit: Payer: Self-pay

## 2021-02-21 ENCOUNTER — Ambulatory Visit (INDEPENDENT_AMBULATORY_CARE_PROVIDER_SITE_OTHER): Payer: Medicare Other | Admitting: Psychiatry

## 2021-02-21 DIAGNOSIS — F411 Generalized anxiety disorder: Secondary | ICD-10-CM | POA: Diagnosis not present

## 2021-02-21 NOTE — Progress Notes (Signed)
[facility] Counselor/Therapist Progress Note  Patient ID: Cody Philmore Read Bonelli., Cody Hines,    Date: 02/21/2021  Time Spent: 50 minutes   10:00am to 10:50am  Treatment Type: Individual Therapy  Reported Symptoms: anxiety, low motivation, depressed, irritable  Mental Status Exam:  Appearance:   Casual     Behavior:  Appropriate and Sharing  Motor:  Normal, although not as steady on his feet  Speech/Language:   Clear and Coherent  Affect:  anxious, depressed  Mood:  anxious and depressed  Thought process:  some tangentiality  Thought content:    some ruminating  Sensory/Perceptual disturbances:    WNL  Orientation:  oriented to person, place, time/date, situation, day of week, month of year and year  Attention:  Fair  Concentration:  [place]  Memory:  sometimes forget where I put things   Fund of knowledge:   Good  Insight:    Good and Fair  Judgment:   Good and Fair  Impulse Control:  Good   Risk Assessment: Danger to Self:  No Self-injurious Behavior: No Danger to Others: No Duty to Warn:no Physical Aggression / Violence:No  Access to Firearms a concern: No  Gang Involvement:No     Subjective: Patient in today reporting anxiety and depression mostly related to personal and family issues. Very focused today on irritability and frustration with another medical provider outside of our office. Reports feeling not heard and dismissed and that has been frustrating and added to his irritability. Got result of his neurological eval indicating "mild cognitive impairment", which he processed some in session today. Still get fixated at times mostly things that anger/frustrate him and very difficult to let go and move forward.  Worked today in session, using interventions below, on a couple specific examples (including results of his neuro eval noted above) of things he holds onto that aggravate him and add to his negativity at times. Stays in healthy contact with son  and daughter and grandchildren. Difficulty letting go of past issues with separated wife even thought we acknowledged in session how hanging on to past hurt keeps him from having more peace in the present.  Reports he is still not drinking. Trying to interrupt his negative/depressive/anxious thought patterns. Encouraged to see "his positives" and let them outweigh any negatives.  Was not as motivated today but did improve over the time during session.  Interventions: Solution-Oriented/Positive Psychology and Ego-Supportive  Diagnosis:   ICD-10-CM   1. Generalized anxiety disorder  F41.1     Treatment Goal Plan Patient is not signing tx goals on computer screen due to COVID. Treatment Goals: Goals remain on tx plan as patient works on strategies to achieve his goals. Progress is documented each session in the "Progress" section of Plan.  Long term goal: Develop healthy cognitive patterns and beliefs about self and the world that lead to alleviation and help prevent relapse of depression. Short term goal: Identify and replace depressive thinking that leads to depressive feelings and actions.He will replace the depressive thinking with more positive, hopeful thoughts that can lead to more positive and hopeful feelings. Strategy: Patient to more consistently monitor his thoughts and catch the depressive/negative thoughts, trying to change them to positiveand more reality-based thought patterns.Look at his negative thoughts and weigh them against the evidence.   Progress / Plan: Patient today in session with less motivation but it did improve some over time in our talking.  Very difficult for him to hold onto positives although reports  no SI.  He states he continues to stick with personal safety concerns and exercises caution whenever he is moving about the house, inside or outside.  He does seem a bit less steady in his walking and a little slower but is able to walk without assistance of  another person or a cane.  Encouraged his continued frequent family interactions, to practice more consistently positive self talk and self-care, to get outside daily as he is able, to stay in contact with people that are supportive of him especially his longtime friends, to use a cane (which he has at home) when necessary for stability, remain on his meds as prescribed, to try to stay more in the present focusing on what he can control or change versus cannot, to try to avoid getting stuck in the past and brooding on negative events or circumstances, and to take advantage of opportunities he has to be with other people through his church, family, or community.  His adult son is continuing to help in managing patient's affairs at this time and patient is fully accepting of this.  Review and progress/challenges noted with patient.  Next appointment within 3-4 weeks.   Shanon Ace, LCSW

## 2021-03-26 ENCOUNTER — Other Ambulatory Visit: Payer: Self-pay

## 2021-03-26 ENCOUNTER — Ambulatory Visit (INDEPENDENT_AMBULATORY_CARE_PROVIDER_SITE_OTHER): Payer: Medicare Other | Admitting: Psychiatry

## 2021-03-26 DIAGNOSIS — F411 Generalized anxiety disorder: Secondary | ICD-10-CM

## 2021-03-26 NOTE — Progress Notes (Signed)
[facility] Counselor/Therapist Progress Note  Patient ID: Cody Hines., MRN: [id number],    Date: 03/26/2021  Time Spent:  50 minutes  Treatment Type: Individual Therapy  Reported Symptoms: Anxiety, depression  Mental Status Exam:  Appearance:   Casual     Behavior:  Appropriate, Sharing, and Motivated  Motor:  Walks slower and sometimes not as steady  Speech/Language:   Clear and Coherent  Affect:  Some anxiousness and some depression  Mood:  anxious and depressed  Thought process:  Some tangentiality  Thought content:    Some ruminating  Sensory/Perceptual disturbances:    WNL  Orientation:  oriented to person, place, time/date, situation, day of week, month of year, and year  Attention:  Fair  Concentration:  Fair  Memory:  Some forgetting and states PCP is aware  Fund of knowledge:   Good  Insight:    Fair  Judgment:   Good and Fair  Impulse Control:  Good   Risk Assessment: Danger to Self:  No Self-injurious Behavior: No Danger to Others: No Duty to Warn:no Physical Aggression / Violence:No  Access to Firearms a concern: No  Gang Involvement:No   Subjective:  Patient in today reporting anxiety as main symptom and also some depression, with symptoms mostly related to "being by myself a lot, worrying about a lot of things, family issues, tends to look at the negatives and hang onto them often recycling them. Denies SI. States his motivation is some better today. Worked today on his hanging onto and accentuating "the negatives" which has been a long time habit for patient.  Very hard for him to see more than just the negative side to things, and worked today on this because it ends up feeding his anxiety and depression. Still easy for patient to get fixated on things and hard to let go, and struggles to see another point of view.(Per short term and long term goals in treatment plan.)  Issues with separated wife continue and there's more distance with  her living out of town now.  Reviewed some safety concerns with him based on recent history and encouraged him to be more careful moving about at home and outside of the home.  Encouraging more work in letting go of past hurts and it keeps him from more fully living and enjoying the in present moment.  Interventions: Solution-Oriented/Positive Psychology and Ego-Supportive  Diagnosis:   ICD-10-CM   1. Generalized anxiety disorder  F41.1       Plan:  Patient today showing better motivation today.  Discussed some personal safety concerns and how to best exercise caution when he is moving about inside or outside of his home.  Encouraged patient in some areas that have proven to be helpful to him in the past including practice more consistent positive self talk and positive self-care, to get outside daily as he is able, continue frequent family interactions with his adult children and grandchildren, stay in contact with people that are supportive of him including longtime friends, to use a cane when needed for stability, to stay more in the present focusing on what he can control, to focus more in the present moment so that he can enjoy situations more fully rather than focusing on negative from the past, to remain on his medications as prescribed, to avoid getting stuck in the past and brooding on negative events or circumstances, to take advantage of opportunities he has to be with other people through his church/family/community, and  to recognize the strength he shows when he does work on his treatment goals especially in trying to let go of negativity and look for more positives.  Adult son continues to help in the management of patient's affairs and so far patient has seemed accepting of this.  Looking forward to some family contact later in the week in [place].   Review and progress/challenges noted with patient.  Next appointment within 3 to 4 weeks.   Shanon Ace,  LCSW

## 2021-03-28 ENCOUNTER — Other Ambulatory Visit: Payer: Self-pay

## 2021-03-28 ENCOUNTER — Ambulatory Visit (INDEPENDENT_AMBULATORY_CARE_PROVIDER_SITE_OTHER): Payer: Medicare Other | Admitting: Adult Health

## 2021-03-28 ENCOUNTER — Encounter: Payer: Self-pay | Admitting: Adult Health

## 2021-03-28 VITALS — BP 122/62 | HR 69 | Ht 73.0 in | Wt 208.0 lb

## 2021-03-28 DIAGNOSIS — G3184 Mild cognitive impairment, so stated: Secondary | ICD-10-CM | POA: Diagnosis not present

## 2021-03-28 DIAGNOSIS — G4733 Obstructive sleep apnea (adult) (pediatric): Secondary | ICD-10-CM | POA: Diagnosis not present

## 2021-03-28 DIAGNOSIS — Z9989 Dependence on other enabling machines and devices: Secondary | ICD-10-CM | POA: Diagnosis not present

## 2021-03-28 NOTE — Progress Notes (Signed)
PATIENT: Cody Hines. DOB: [birth year]  REASON FOR VISIT: follow up HISTORY FROM: patient PRIMARY NEUROLOGIST: Dr. Brett Fairy  HISTORY OF PRESENT ILLNESS: Today 03/28/21: Mr. Montz is a 75 year old male with a history of obstructive sleep apnea on CPAP and memory disturbance.  He returns today for follow-up.  The patient had MRI of the brain that was relatively unremarkable.  Also had an MRI of the thoracic spine that showed an old compression fracture.  Overall the patient feels that his memory has remained relatively stable.  He states that he may forget where he leaves things.  Otherwise he lives at home alone.  He is able to complete all ADLs independently.  He operates a Teacher, music without difficulty.  He does report that he had a hearing test and will need hearing aids.  He feels that his hearing affects his ability to understand questions.  He returns today for evaluation.      REVIEW OF SYSTEMS: Out of a complete 14 system review of symptoms, the patient complains only of the following symptoms, and all other reviewed systems are negative.   ESS 13  ALLERGIES: Allergies  Allergen Reactions   Asa [Aspirin] Diarrhea, Nausea Only and Other (See Comments)    History of ulcers, also   Atorvastatin Other (See Comments)    Muscle aches   Celebrex [Celecoxib] Other (See Comments)    Headaches    Codeine Nausea And Vomiting   Savella [Milnacipran Hcl] Other (See Comments)    Reaction not recalled   Viagra [Sildenafil Citrate] Other (See Comments)    Headaches   Penicillins Rash and Other (See Comments)    HOME MEDICATIONS: Outpatient Medications Prior to Visit  Medication Sig Dispense Refill   acetaminophen (TYLENOL) 650 MG CR tablet Take 650 mg by mouth every 8 (eight) hours as needed for pain (or back pain or migraines).      aspirin EC 81 MG tablet Take 81 mg by mouth daily.     Cholecalciferol (VITAMIN D3) 250 MCG (10000 UT) capsule Take 10,000 Units  by mouth daily.     enalapril (VASOTEC) 20 MG tablet Take  1 tablet  Daily  for BP 90 tablet 1   Flaxseed, Linseed, (FLAX SEED OIL) 1000 MG CAPS Take 2,000 mg by mouth daily.      LORazepam (ATIVAN) 1 MG tablet Take 1 tablet (1 mg total) by mouth at bedtime as needed for sleep. As needed 30 tablet 5   Magnesium 500 MG TABS Take 500 mg by mouth daily.      rosuvastatin (CRESTOR) 40 MG tablet Take  1 tablet  Daily  for Cholesterol 90 tablet 3   sertraline (ZOLOFT) 100 MG tablet Take 2 tablets (200 mg total) by mouth daily. 180 tablet 3   No facility-administered medications prior to visit.    PAST MEDICAL HISTORY: Past Medical History:  Diagnosis Date   BPH (benign prostatic hyperplasia)    Fibromyalgia    GERD (gastroesophageal reflux disease)    Hyperlipidemia    Hypertension    Hypogonadism male    IBS (irritable bowel syndrome)    OSA (obstructive sleep apnea)     PAST SURGICAL HISTORY: Past Surgical History:  Procedure Laterality Date    nasal smr np3  1985   CATARACT EXTRACTION, BILATERAL Bilateral 2021   Dr. Katy Fitch, L 5/27, R 3/25   KNEE ARTHROSCOPY Left 1999   SKIN CANCER EXCISION  2020   nose   SPINE SURGERY  2007   L5 S 1 Disk   SPINE SURGERY  09/2019   Compression fracture stabilization by Dr. 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  Tobacco Use   Smoking status: Former    Years: 30.00    Pack years: 0.00    Types: Cigarettes    Quit date: 10/06/1980    Years since quitting: 40.5   Smokeless tobacco: Never  Vaping  Use   Vaping Use: Never used  Substance and Sexual Activity   Alcohol use: No    Comment: Not drinking x 1 month   Drug use: No   Sexual activity: Not Currently  Other Topics Concern   Not on file  Social History Narrative   Not on file   Social Determinants of Health   Financial Resource Strain: Not on file  Food Insecurity: Not on file  Transportation Needs: Not on file  Physical Activity: Not on file  Stress: Not on file  Social Connections: Not on file  Intimate Partner Violence: Not on file      PHYSICAL EXAM  Vitals:   03/28/21 0902  BP: 122/62  Pulse: 69  Weight: 208 lb (94.3 kg)  Height: 6\' 1"  (1.854 m)   Body mass index is 27.44 kg/m.  Montreal Cognitive Assessment  03/28/2021 11/22/2020  Visuospatial/ Executive (0/5) 4 4  Naming (0/3) 3 3  Attention: Read list of digits (0/2) 2 2  Attention: Read list of letters (0/1) 1 1  Attention: Serial 7 subtraction starting at 100 (0/3) 3 3  Language: Repeat phrase (0/2) 1 2  Language : Fluency (0/1) 1 1  Abstraction (0/2) 2 2  Delayed Recall (0/5) 1 3  Orientation (0/6) 6 6  Total 24 27     Generalized: Well developed, in no acute distress  Chest: Lungs clear to auscultation bilaterally  Neurological examination  Mentation: Alert oriented to time, place, history taking. Follows all commands speech and language fluent Cranial nerve II-XII: Extraocular movements were full, visual field were full on confrontational test Head turning and shoulder shrug  were normal and symmetric. Motor: The motor testing reveals 5 over 5 strength of all 4 extremities. Good symmetric motor tone is noted throughout.  Rapid alternating movement in the upper extremities only mildly impaired.  Sensory: Sensory testing is intact to soft touch on all 4 extremities. No evidence of extinction is noted.  Gait and station: Stands without assistance.  Good stride and arm swing.  3 steps with turns   DIAGNOSTIC DATA (LABS, IMAGING,  TESTING) - I reviewed patient records, labs, notes, testing and imaging myself where available.  Lab Results  Component Value Date   WBC 5.8 01/09/2021   HGB 12.6 (L) 01/09/2021   HCT 38.3 (L) 01/09/2021   MCV 91.6 01/09/2021   PLT 117 (L) 01/09/2021      Component Value Date/Time   NA 142  01/09/2021 0959   K 4.4 01/09/2021 0959   CL 104 01/09/2021 0959   CO2 32 01/09/2021 0959   GLUCOSE 91 01/09/2021 0959   BUN 10 01/09/2021 0959   CREATININE 1.42 (H) 01/09/2021 0959   CALCIUM 9.5 01/09/2021 0959   CALCIUM 9.5 01/09/2021 0959   PROT 6.3 01/09/2021 0959   ALBUMIN 3.9 06/22/2019 2128   AST 35 01/09/2021 0959   ALT 21 01/09/2021 0959   ALKPHOS 93 06/22/2019 2128   BILITOT 1.1 01/09/2021 0959   GFRNONAA 49 (L) 01/09/2021 0959   GFRAA 57 (L) 01/09/2021 0959   Lab Results  Component Value Date   CHOL 136 01/09/2021   HDL 55 01/09/2021   LDLCALC 65 01/09/2021   TRIG 82 01/09/2021   CHOLHDL 2.5 01/09/2021   Lab Results  Component Value Date   HGBA1C 5.5 01/09/2021   Lab Results  Component Value Date   VITAMINB12 370 07/19/2019   Lab Results  Component Value Date   TSH 3.29 01/09/2021      ASSESSMENT AND PLAN 75 y.o. year old male  has a past medical history of BPH (benign prostatic hyperplasia), Fibromyalgia, GERD (gastroesophageal reflux disease), Hyperlipidemia, Hypertension, Hypogonadism male, IBS (irritable bowel syndrome), and OSA (obstructive sleep apnea). here with:  OSA on CPAP  - CPAP compliance suboptimal encourage patient to use the machine greater than 4 hours each night - Good treatment of AHI   2.  Memory disturbance  - MOCA 24/30 previously 27/30 -Patient feels that the lower score is due to his hearing.  He would like to recheck his memory once he has hearing aids if it remains low he will be willing to try medication -Family history of Parkinson's disease however his physical exam is relatively unremarkable    Ward Givens, MSN, NP-C  03/28/2021, 9:07 AM [facility] [address], [place], [place] [address] [phone]

## 2021-03-28 NOTE — Patient Instructions (Signed)
Your Plan:  Continue to use CPAP nightly >4 hours each night Instead of using ativan can try OTC melatonin 1-3 mg 1 hour before bedtime If your symptoms worsen or you develop new symptoms please let us know.       Thank you for coming to see [place] at [facility]. I hope we have been able to provide you high quality care today.  You may receive a patient satisfaction survey over the next few weeks. We would appreciate your feedback and comments so that we may continue to improve ourselves and the health of our patients.

## 2021-04-25 ENCOUNTER — Ambulatory Visit (INDEPENDENT_AMBULATORY_CARE_PROVIDER_SITE_OTHER): Payer: Medicare Other | Admitting: Psychiatry

## 2021-04-25 ENCOUNTER — Other Ambulatory Visit: Payer: Self-pay

## 2021-04-25 ENCOUNTER — Other Ambulatory Visit: Payer: Self-pay | Admitting: Internal Medicine

## 2021-04-25 DIAGNOSIS — F411 Generalized anxiety disorder: Secondary | ICD-10-CM | POA: Diagnosis not present
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
## 2021-06-11 ENCOUNTER — Other Ambulatory Visit: Payer: Self-pay

## 2021-06-11 ENCOUNTER — Ambulatory Visit (INDEPENDENT_AMBULATORY_CARE_PROVIDER_SITE_OTHER): Payer: Medicare Other | Admitting: Psychiatry

## 2021-06-11 DIAGNOSIS — F411 Generalized anxiety disorder: Secondary | ICD-10-CM | POA: Diagnosis not present

## 2021-06-11 NOTE — Progress Notes (Signed)
[facility] Counselor/Therapist Progress Note  Patient ID: Cody Hines [place]., MRN: [id number],    Date: 06/11/2021  Time Spent: 50 minutes   Treatment Type: Individual Therapy  Reported Symptoms: anxiety, depression  Mental Status Exam:  Appearance:   Casual     Behavior:  Appropriate, Sharing, and some motivation  Motor:  Slowered walking but not requiring cane nor assistance  Speech/Language:   Clear and Coherent  Affect:  Anxious, depressed "but not worse"  Mood:  anxious and depressed  Thought process:  goal directed  Thought content:    Some ruminating  Sensory/Perceptual disturbances:    WNL  Orientation:  oriented to person, place, time/date, situation, day of week, month of year, year, and stated date of Sept. 6, 2022  Attention:  Fair  Concentration:  Fair  Memory:  Some "recent memory issues and forgetting things" and Dr is aware  Fund of knowledge:   Good  Insight:    Fair  Judgment:   Good  Impulse Control:  Good   Risk Assessment: Danger to Self:  No Self-injurious Behavior: No Danger to Others: No Duty to Warn:no Physical Aggression / Violence:No  Access to Firearms a concern: No  Gang Involvement:No   Subjective:  Patient in today reporting anxiety and some depression but overall had a better "July and August". Saw grandkids more over the summer so that was a positive for patient. Attended 49 yr old grandson's birthday party at an amusement park and had a great time, even though former wife was present also (which is usually very stressful/tense) but things went a little better this time, which was encouraging. Demonstrates still dwelling on the negative or "what may go wrong versus right", but able today to lean a little more in a positive direction. Denies any SI. Easily frustrated. Is being more careful in walking, as he is aware of not being as stable on his feet. Trying to see more positives, and doing a little better today as he cites some  positive family interactions. Reports working harder on letting go of negatives rather than getting so fixated on them. Motivation improved some. Still working to let go more of negativity re: former wife and not attach too quickly to things she may say or do that aggravate him. Continued work on letting go of other hurts/memories from the past. Encouraged patient to be careful in his walking especially since he has had some recent falls and is not as stable on his feet, but can walk fine when he paying close attention.    Interventions: Solution-Oriented/Positive Psychology and Ego-Supportive  Diagnosis:   ICD-10-CM   1. Generalized anxiety disorder  F41.1       Treatment Goal Plan:  Patient is not signing tx goals on computer screen due to [place]. Treatment Goals: Goals remain on tx plan as patient works on strategies to achieve his goals.  Progress is documented each session in the "Progress" section of Plan. Long term goal: Develop healthy cognitive patterns and beliefs about self and the world that lead to alleviation and help prevent relapse of depression. Short term goal: Identify and replace depressive thinking that leads to depressive feelings and actions.  He will replace the depressive thinking with more positive, hopeful thoughts that can lead to more positive and hopeful feelings. Strategy: Patient to more consistently monitor his thoughts and catch the depressive/negative thoughts, trying to change them to positive and more reality-based thought patterns. Look at his negative thoughts and  weigh them against the evidence.    Plan:  Patient today showing some good motivation as he worked with some issues from his past "to let go of" and also family concerns/challenges.  Enjoyed increased contact with grandchildren this summer. Encouraged patient to continue with his goal-directed behaviors as there has been more noticeable change since seeing him last session as far as his  interaction with other people and overall level of happiness.  Physically he is not as agile and he reports his PCP is aware.  Encouraged patient in practicing behaviors that have been helpful to him between sessions including: Frequent family interactions with adult children and grandchildren and his sister, staying in contact with people who are supportive of him including longtime friends, using a cane when needed for stability and walking, consistent positive self talk as discussed in sessions, getting outside daily as he is able, staying more focused on the present and what he can control, also focusing on the present to better enjoy situations rather than focusing on negativity from the past, remain on his prescribed medications, take advantage of opportunities he has to be with other people through his church/family/community, and to recognize the strength he is showing as he works with goal-directed behaviors in trying to let go of negativity and look for more positives as he moves forward towards improved emotional health.  Goal review and progress/challenges noted with patient.  Next appointment within approximately 1 month.   Shanon Ace, LCSW

## 2021-06-22 ENCOUNTER — Telehealth: Payer: Self-pay | Admitting: Internal Medicine

## 2021-06-22 ENCOUNTER — Emergency Department (HOSPITAL_COMMUNITY): Payer: Medicare Other

## 2021-06-22 ENCOUNTER — Inpatient Hospital Stay (HOSPITAL_COMMUNITY)
Admission: EM | Admit: 2021-06-22 | Discharge: 2021-06-28 | DRG: 552 | Disposition: A | Discharge status: Skilled Nursing Facility | Payer: Medicare Other | Attending: Internal Medicine | Admitting: Internal Medicine

## 2021-06-22 DIAGNOSIS — R911 Solitary pulmonary nodule: Secondary | ICD-10-CM | POA: Diagnosis present

## 2021-06-22 DIAGNOSIS — W19XXXA Unspecified fall, initial encounter: Secondary | ICD-10-CM | POA: Diagnosis present

## 2021-06-22 DIAGNOSIS — G4733 Obstructive sleep apnea (adult) (pediatric): Secondary | ICD-10-CM

## 2021-06-22 DIAGNOSIS — W28XXXA Contact with powered lawn mower, initial encounter: Principal | ICD-10-CM

## 2021-06-22 DIAGNOSIS — S32039A Unspecified fracture of third lumbar vertebra, initial encounter for closed fracture: Secondary | ICD-10-CM | POA: Diagnosis present

## 2021-06-22 DIAGNOSIS — R079 Chest pain, unspecified: Secondary | ICD-10-CM

## 2021-06-22 DIAGNOSIS — N183 Chronic kidney disease, stage 3 unspecified: Secondary | ICD-10-CM | POA: Diagnosis present

## 2021-06-22 DIAGNOSIS — Y92009 Unspecified place in unspecified non-institutional (private) residence as the place of occurrence of the external cause: Secondary | ICD-10-CM

## 2021-06-22 DIAGNOSIS — I10 Essential (primary) hypertension: Secondary | ICD-10-CM | POA: Diagnosis present

## 2021-06-22 DIAGNOSIS — X58XXXA Exposure to other specified factors, initial encounter: Secondary | ICD-10-CM

## 2021-06-22 DIAGNOSIS — E440 Moderate protein-calorie malnutrition: Secondary | ICD-10-CM | POA: Insufficient documentation

## 2021-06-22 DIAGNOSIS — Z9989 Dependence on other enabling machines and devices: Secondary | ICD-10-CM

## 2021-06-22 DIAGNOSIS — S32029A Unspecified fracture of second lumbar vertebra, initial encounter for closed fracture: Secondary | ICD-10-CM | POA: Diagnosis present

## 2021-06-22 DIAGNOSIS — G3184 Mild cognitive impairment, so stated: Secondary | ICD-10-CM | POA: Insufficient documentation

## 2021-06-22 DIAGNOSIS — Z7982 Long term (current) use of aspirin: Secondary | ICD-10-CM

## 2021-06-22 DIAGNOSIS — Z803 Family history of malignant neoplasm of breast: Secondary | ICD-10-CM

## 2021-06-22 DIAGNOSIS — I8510 Secondary esophageal varices without bleeding: Secondary | ICD-10-CM | POA: Diagnosis present

## 2021-06-22 DIAGNOSIS — Z880 Allergy status to penicillin: Secondary | ICD-10-CM

## 2021-06-22 DIAGNOSIS — N1832 Chronic kidney disease, stage 3b: Secondary | ICD-10-CM | POA: Diagnosis present

## 2021-06-22 DIAGNOSIS — Z833 Family history of diabetes mellitus: Secondary | ICD-10-CM

## 2021-06-22 DIAGNOSIS — E785 Hyperlipidemia, unspecified: Secondary | ICD-10-CM | POA: Diagnosis present

## 2021-06-22 DIAGNOSIS — N400 Enlarged prostate without lower urinary tract symptoms: Secondary | ICD-10-CM | POA: Diagnosis present

## 2021-06-22 DIAGNOSIS — N179 Acute kidney failure, unspecified: Secondary | ICD-10-CM | POA: Diagnosis present

## 2021-06-22 DIAGNOSIS — K219 Gastro-esophageal reflux disease without esophagitis: Secondary | ICD-10-CM | POA: Diagnosis present

## 2021-06-22 DIAGNOSIS — Z885 Allergy status to narcotic agent status: Secondary | ICD-10-CM

## 2021-06-22 DIAGNOSIS — Z87891 Personal history of nicotine dependence: Secondary | ICD-10-CM

## 2021-06-22 DIAGNOSIS — Z886 Allergy status to analgesic agent status: Secondary | ICD-10-CM

## 2021-06-22 DIAGNOSIS — Z801 Family history of malignant neoplasm of trachea, bronchus and lung: Secondary | ICD-10-CM

## 2021-06-22 DIAGNOSIS — Z20822 Contact with and (suspected) exposure to covid-19: Secondary | ICD-10-CM | POA: Diagnosis present

## 2021-06-22 DIAGNOSIS — M4856XA Collapsed vertebra, not elsewhere classified, lumbar region, initial encounter for fracture: Secondary | ICD-10-CM | POA: Diagnosis present

## 2021-06-22 DIAGNOSIS — M797 Fibromyalgia: Secondary | ICD-10-CM | POA: Diagnosis present

## 2021-06-22 DIAGNOSIS — Z79899 Other long term (current) drug therapy: Secondary | ICD-10-CM

## 2021-06-22 DIAGNOSIS — Z85828 Personal history of other malignant neoplasm of skin: Secondary | ICD-10-CM

## 2021-06-22 DIAGNOSIS — Z6825 Body mass index (BMI) 25.0-25.9, adult: Secondary | ICD-10-CM

## 2021-06-22 DIAGNOSIS — Z8261 Family history of arthritis: Secondary | ICD-10-CM

## 2021-06-22 DIAGNOSIS — Z8249 Family history of ischemic heart disease and other diseases of the circulatory system: Secondary | ICD-10-CM

## 2021-06-22 DIAGNOSIS — Z888 Allergy status to other drugs, medicaments and biological substances status: Secondary | ICD-10-CM

## 2021-06-22 DIAGNOSIS — I129 Hypertensive chronic kidney disease with stage 1 through stage 4 chronic kidney disease, or unspecified chronic kidney disease: Secondary | ICD-10-CM | POA: Diagnosis present

## 2021-06-22 LAB — COMPREHENSIVE METABOLIC PANEL
ALT: 26 U/L (ref 0–44)
AST: 46 U/L — ABNORMAL HIGH (ref 15–41)
Albumin: 3.6 g/dL (ref 3.5–5.0)
Alkaline Phosphatase: 79 U/L (ref 38–126)
Anion gap: 9 (ref 5–15)
BUN: 11 mg/dL (ref 8–23)
CO2: 24 mmol/L (ref 22–32)
Calcium: 9.6 mg/dL (ref 8.9–10.3)
Chloride: 107 mmol/L (ref 98–111)
Creatinine, Ser: 1.69 mg/dL — ABNORMAL HIGH (ref 0.61–1.24)
GFR, Estimated: 43 mL/min — ABNORMAL LOW (ref >60)
Glucose, Bld: 189 mg/dL — ABNORMAL HIGH (ref 70–99)
Potassium: 3.7 mmol/L (ref 3.5–5.1)
Sodium: 140 mmol/L (ref 135–145)
Total Bilirubin: 1.7 mg/dL — ABNORMAL HIGH (ref 0.3–1.2)
Total Protein: 6.4 g/dL — ABNORMAL LOW (ref 6.5–8.1)

## 2021-06-22 LAB — CBC
HCT: 39.2 % (ref 39.0–52.0)
Hemoglobin: 13.6 g/dL (ref 13.0–17.0)
MCH: 31.3 pg (ref 26.0–34.0)
MCHC: 34.7 g/dL (ref 30.0–36.0)
MCV: 90.3 fL (ref 80.0–100.0)
Platelets: 114 10*3/uL — ABNORMAL LOW (ref 150–400)
RBC: 4.34 MIL/uL (ref 4.22–5.81)
RDW: 13.2 % (ref 11.5–15.5)
WBC: 6 10*3/uL (ref 4.0–10.5)
nRBC: 0 % (ref 0.0–0.2)

## 2021-06-22 LAB — PROTIME-INR
INR: 1.4 — ABNORMAL HIGH (ref 0.8–1.2)
Prothrombin Time: 16.9 seconds — ABNORMAL HIGH (ref 11.4–15.2)

## 2021-06-22 LAB — I-STAT CHEM 8, ED
BUN: 12 mg/dL (ref 8–23)
Calcium, Ion: 1.19 mmol/L (ref 1.15–1.40)
Chloride: 106 mmol/L (ref 98–111)
Creatinine, Ser: 1.6 mg/dL — ABNORMAL HIGH (ref 0.61–1.24)
Glucose, Bld: 183 mg/dL — ABNORMAL HIGH (ref 70–99)
HCT: 39 % (ref 39.0–52.0)
Hemoglobin: 13.3 g/dL (ref 13.0–17.0)
Potassium: 3.7 mmol/L (ref 3.5–5.1)
Sodium: 141 mmol/L (ref 135–145)
TCO2: 23 mmol/L (ref 22–32)

## 2021-06-22 LAB — URINALYSIS, ROUTINE W REFLEX MICROSCOPIC
Bilirubin Urine: NEGATIVE
Glucose, UA: 100 mg/dL — AB
Ketones, ur: NEGATIVE mg/dL
Leukocytes,Ua: NEGATIVE
Nitrite: NEGATIVE
Protein, ur: NEGATIVE mg/dL
Specific Gravity, Urine: 1.01 (ref 1.005–1.030)
pH: 6 (ref 5.0–8.0)

## 2021-06-22 LAB — SAMPLE TO BLOOD BANK

## 2021-06-22 LAB — URINALYSIS, MICROSCOPIC (REFLEX): Bacteria, UA: NONE SEEN

## 2021-06-22 LAB — ETHANOL: Alcohol, Ethyl (B): <10 mg/dL (ref <10)

## 2021-06-22 LAB — RESP PANEL BY RT-PCR (FLU A&B, COVID) ARPGX2
Influenza A by PCR: NEGATIVE
Influenza B by PCR: NEGATIVE
SARS Coronavirus 2 by RT PCR: NEGATIVE

## 2021-06-22 LAB — LACTIC ACID, PLASMA: Lactic Acid, Venous: 2.4 mmol/L (ref 0.5–1.9)

## 2021-06-22 MED ORDER — IOHEXOL 300 MG/ML  SOLN
80.0000 mL | Freq: Once | INTRAMUSCULAR | Status: AC | PRN
  Administered 2021-06-22: 80 mL via INTRAVENOUS

## 2021-06-22 MED ORDER — ACETAMINOPHEN 650 MG RE SUPP: 650.0000 mg | Freq: Four times a day (QID) | RECTAL | Status: DC | PRN

## 2021-06-22 MED ORDER — HYDROMORPHONE HCL 1 MG/ML IJ SOLN
0.5000 mg | Freq: Once | INTRAMUSCULAR | Status: AC
  Administered 2021-06-22: 0.5 mg via INTRAVENOUS
  Filled 2021-06-22: qty 1

## 2021-06-22 MED ORDER — ENALAPRIL MALEATE 5 MG PO TABS
20.0000 mg | ORAL_TABLET | Freq: Every day | ORAL | Status: DC
  Administered 2021-06-23 – 2021-06-28 (×6): 20 mg via ORAL
  Filled 2021-06-22 (×4): qty 4
  Filled 2021-06-22: qty 1
  Filled 2021-06-22 (×4): qty 4

## 2021-06-22 MED ORDER — ACETAMINOPHEN 325 MG PO TABS
650.0000 mg | ORAL_TABLET | Freq: Four times a day (QID) | ORAL | Status: DC | PRN
  Administered 2021-06-23 – 2021-06-27 (×3): 650 mg via ORAL
  Filled 2021-06-22 (×3): qty 2

## 2021-06-22 MED ORDER — HYDROMORPHONE HCL 1 MG/ML IJ SOLN
0.5000 mg | INTRAMUSCULAR | Status: DC | PRN
  Administered 2021-06-23 – 2021-06-28 (×15): 1 mg via INTRAVENOUS
  Filled 2021-06-22 (×15): qty 1

## 2021-06-22 MED ORDER — ACETAMINOPHEN 500 MG PO TABS
1000.0000 mg | ORAL_TABLET | Freq: Once | ORAL | Status: AC
  Administered 2021-06-22: 1000 mg via ORAL
  Filled 2021-06-22: qty 2

## 2021-06-22 MED ORDER — ROSUVASTATIN CALCIUM 20 MG PO TABS
40.0000 mg | ORAL_TABLET | Freq: Every day | ORAL | Status: DC
  Administered 2021-06-23 – 2021-06-28 (×6): 40 mg via ORAL
  Filled 2021-06-22 (×6): qty 2

## 2021-06-22 MED ORDER — ONDANSETRON HCL 4 MG/2ML IJ SOLN: 4.0000 mg | Freq: Four times a day (QID) | INTRAMUSCULAR | Status: DC | PRN

## 2021-06-22 MED ORDER — ONDANSETRON HCL 4 MG PO TABS: 4.0000 mg | ORAL_TABLET | Freq: Four times a day (QID) | ORAL | Status: DC | PRN

## 2021-06-22 MED ORDER — SERTRALINE HCL 100 MG PO TABS
100.0000 mg | ORAL_TABLET | Freq: Two times a day (BID) | ORAL | Status: DC
  Administered 2021-06-22 – 2021-06-28 (×12): 100 mg via ORAL
  Filled 2021-06-22: qty 1
  Filled 2021-06-22: qty 2
  Filled 2021-06-22 (×11): qty 1

## 2021-06-22 NOTE — Progress Notes (Signed)
Orthopedic Tech Progress Note Patient Details:  Cody Hines. 04-16-1946 [id number]  Ortho Devices Ortho Device/Splint Location: TLSO Ortho Device/Splint Interventions: Ordered    TLSO left in room  Yasin Ducat A Sabra Sessler 06/22/2021, 6:16 PM

## 2021-06-22 NOTE — ED Notes (Signed)
Patient transported to CT 

## 2021-06-22 NOTE — Progress Notes (Signed)
Orthopedic Tech Progress Note Patient Details:  Cody Hines. 1946/02/20 [id number] Level 1 Trauma. Downgraded Patient ID: Cody Philmore [place]., male   DOB: Apr 22, 1946, 75 y.o.   MRN: [id number]  Chip Boer 06/22/2021, 3:34 PM

## 2021-06-22 NOTE — ED Notes (Signed)
Warm blanket applied

## 2021-06-22 NOTE — Telephone Encounter (Signed)
Encounter generated to pull history for hospital admission (chart merge patient).

## 2021-06-22 NOTE — ED Notes (Signed)
Pt assisted to stand at bedside with 2-staff assist.  Pt able to take several steps - stated pain 10/10.  MD notified.
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
## 2021-06-22 NOTE — ED Notes (Signed)
Pt friend at bedside 

## 2021-06-22 NOTE — ED Provider Notes (Signed)
[facility] EMERGENCY DEPARTMENT Provider Note   CSN: [id number] Arrival date & time: 06/22/21  1506     History No chief complaint on file.   Cody Hines. is a 75 y.o. male with PMHx HTN, CKD who presents for evaluation of injuries sustained in a lawn-mover accident.   Patient was working in his yard today when he accidentally lost control of his lawnmower, resulting in the lawnmower rolling backwards and pinning him.  He was not struck by the blades.  He states he tried to climb out from underneath but was unable to.  Neighbors witnessed this and were able to extricate the patient.  Upon EMS arrival, the patient was initially noted to be hypotensive, but subsequent blood pressure readings were within normal.  Patient was subsequently transported to our emergency department for further evaluation.  Upon arrival, the patient is ABC intact, hemodynamically stable, and complaining of low back pain.    No past medical history on file.  Patient Active Problem List   Diagnosis Date Noted   HTN (hypertension) 06/22/2021   Closed L2 vertebral fracture ([place]) 06/22/2021   Closed L3 vertebral fracture ([place]) 06/22/2021   Contact with powered lawnmower as cause of accidental injury at home as place of occurrence 06/22/2021   CKD (chronic kidney disease) stage 3, GFR 30-59 ml/min ([place]) 06/22/2021   OSA on CPAP 06/22/2021   MCI (mild cognitive impairment) 06/22/2021   Pulmonary nodule 06/22/2021       No family history on file.     Home Medications Prior to Admission medications   Medication Sig Start Date End Date Taking? Authorizing Provider  Cholecalciferol (VITAMIN D-3 PO) Take 1 capsule by mouth daily with breakfast.   Yes [provider]  enalapril (VASOTEC) 20 MG tablet Take 20 mg by mouth daily. 05/25/21  Yes [provider]  Flaxseed, Linseed, (FLAX SEED OIL PO) Take 1 capsule by mouth daily with breakfast.   Yes [provider]  MAGNESIUM PO Take 1 tablet by mouth daily with breakfast.   Yes [provider]  rosuvastatin (CRESTOR) 40 MG tablet Take 40 mg by mouth daily. 06/12/21  Yes [provider]  sertraline (ZOLOFT) 100 MG tablet Take 100 mg by mouth in the morning and at bedtime. 03/27/21  Yes [provider]  TYLENOL 8 HOUR 650 MG CR tablet Take 1,950 mg by mouth daily as needed for pain.   Yes [provider]    Allergies    Aspirin, Atorvastatin, Celebrex [celecoxib], Codeine, Savella [milnacipran], Viagra [sildenafil], and Penicillins  Review of Systems   Review of Systems  Constitutional:  Negative for chills and fever.  HENT:  Negative for ear pain and sore throat.   Eyes:  Negative for pain and visual disturbance.  Respiratory:  Negative for cough and shortness of breath.   Cardiovascular:  Negative for chest pain and palpitations.  Gastrointestinal:  Negative for abdominal pain and vomiting.  Genitourinary:  Negative for dysuria and hematuria.  Musculoskeletal:  Positive for back pain and myalgias. Negative for arthralgias.  Skin:  Positive for wound. Negative for color change and rash.  Neurological:  Negative for seizures and syncope.  All other systems reviewed and are negative.  Physical Exam Updated Vital Signs BP 138/67   Pulse 78   Temp 97.6 F (36.4 C) (Temporal)   Resp (!) 23   Ht '6\' 1"'$  (1.854 m)   Wt 88.5 kg   SpO2 93%   BMI  25.73 kg/m   Physical Exam Vitals and nursing note reviewed.  Constitutional:      Appearance: He is well-developed.  HENT:     Head: Normocephalic and atraumatic.     Comments: No palpable skull fracture or scalp hematoma Eyes:     Conjunctiva/sclera: Conjunctivae normal.  Neck:     Comments: No cervical spine tenderness to palpation, no step-offs or deformity.  [place] J collar in place. Cardiovascular:     Rate and Rhythm: Normal rate and regular rhythm.     Heart sounds: No murmur heard.     Comments: 2+ radial and DP pulses. Pulmonary:     Effort: Pulmonary effort is normal. No respiratory distress.     Breath sounds: Normal breath sounds.     Comments: Chest is stable to AP and lateral compression without crepitus or deformity.  Tenderness to palpation over the left lateral chest wall.  Clavicles are stable to compression without deformity. Chest:     Chest wall: Tenderness present.  Abdominal:     Palpations: Abdomen is soft.     Tenderness: There is no abdominal tenderness.     Comments: Abdomen is soft, nontender, nondistended.  No rebound or guarding.  Musculoskeletal:        General: Tenderness present. No swelling or deformity. Normal range of motion.     Cervical back: Neck supple.     Right lower leg: No edema.     Left lower leg: No edema.     Comments: No thoracic spine tenderness to palpation.  Lumbar spine is tender to palpation along the midline with no step-offs or deformities.  All 4 extremities warm well perfused without gross deformity.  Skin:    General: Skin is warm and dry.     Capillary Refill: Capillary refill takes less than 2 seconds.     Comments: Small abrasions over the back and forearms.  Neurological:     General: No focal deficit present.     Mental Status: He is alert and oriented to person, place, and time. Mental status is at baseline.    ED Results / Procedures / Treatments   Labs (all labs ordered are listed, but only abnormal results are displayed) Labs Reviewed  COMPREHENSIVE METABOLIC PANEL - Abnormal; Notable for the following components:      Result Value   Glucose, Bld 189 (*)    Creatinine, Ser 1.69 (*)    Total Protein 6.4 (*)    AST 46 (*)    Total Bilirubin 1.7 (*)    GFR, Estimated 43 (*)    All other components within normal limits  CBC - Abnormal; Notable for the following components:   Platelets 114 (*)    All other components within normal limits  URINALYSIS, ROUTINE W REFLEX MICROSCOPIC - Abnormal; Notable for  the following components:   Glucose, UA 100 (*)    Hgb urine dipstick TRACE (*)    All other components within normal limits  LACTIC ACID, PLASMA - Abnormal; Notable for the following components:   Lactic Acid, Venous 2.4 (*)    All other components within normal limits  PROTIME-INR - Abnormal; Notable for the following components:   Prothrombin Time 16.9 (*)    INR 1.4 (*)    All other components within normal limits  CBC - Abnormal; Notable for the following components:   Platelets 100 (*)    All other components within normal limits  BASIC METABOLIC PANEL - Abnormal; Notable for the following  components:   Glucose, Bld 126 (*)    Creatinine, Ser 1.55 (*)    GFR, Estimated 47 (*)    All other components within normal limits  I-STAT CHEM 8, ED - Abnormal; Notable for the following components:   Creatinine, Ser 1.60 (*)    Glucose, Bld 183 (*)    All other components within normal limits  RESP PANEL BY RT-PCR (FLU A&B, COVID) ARPGX2  ETHANOL  URINALYSIS, MICROSCOPIC (REFLEX)  SAMPLE TO BLOOD BANK    EKG None  Radiology CT HEAD WO CONTRAST  Result Date: 06/22/2021 CLINICAL DATA:  Head trauma. EXAM: CT HEAD WITHOUT CONTRAST CT CERVICAL SPINE WITHOUT CONTRAST TECHNIQUE: Multidetector CT imaging of the head and cervical spine was performed following the standard protocol without intravenous contrast. Multiplanar CT image reconstructions of the cervical spine were also generated. COMPARISON:  CT scan of the brain June 22, 2019. FINDINGS: CT HEAD FINDINGS Brain: No subdural, epidural, or subarachnoid hemorrhage. Ventricles are mildly prominent but stable. Sulci are stable. No mass effect or midline shift. No acute cortical ischemia or infarct. Cerebellum, brainstem, and basal cisterns are normal. Vascular: No hyperdense vessel or unexpected calcification. Skull: Normal. Negative for fracture or focal lesion. Sinuses/Orbits: No acute finding. Other: None. CT CERVICAL SPINE FINDINGS  Alignment: Normal. Skull base and vertebrae: No acute fracture. No primary bone lesion or focal pathologic process. Soft tissues and spinal canal: No prevertebral fluid or swelling. No visible canal hematoma. Disc levels: Multilevel degenerative disc disease, mild. Mild facet degenerative changes. Upper chest: Negative. Other: A sclerotic focus is seen in the C4 vertebral body. IMPRESSION: 1. No acute intracranial abnormalities. 2. No fracture or traumatic malalignment in the cervical spine. 3. The rounded region of sclerosis in the C4 vertebral body is nonspecific but may represent a bone island. Electronically Signed   By: Dorise Bullion III M.D.   On: 06/22/2021 15:53   CT CERVICAL SPINE WO CONTRAST  Result Date: 06/22/2021 CLINICAL DATA:  MVC, trauma EXAM: CT CERVICAL SPINE WITHOUT CONTRAST TECHNIQUE: Multidetector CT imaging of the cervical spine was performed without intravenous contrast. Multiplanar CT image reconstructions were also generated. COMPARISON:  None. FINDINGS: Alignment: Normal. Skull base and vertebrae: No acute fracture. No suspicious primary bone lesion or focal pathologic process. Incidental small benign densely ossified bone island of C4 (series 8, image 43). Soft tissues and spinal canal: No prevertebral fluid or swelling. No visible canal hematoma. Disc levels: Minimal multilevel disc space height loss and osteophytosis throughout. Upper chest: Negative. Other: None. IMPRESSION: 1. No fracture or static subluxation of the cervical spine. 2. Minimal multilevel disc space height loss and osteophytosis throughout. Electronically Signed   By: Eddie Candle M.D.   On: 06/22/2021 15:50   DG Pelvis Portable  Result Date: 06/22/2021 CLINICAL DATA:  Riding mower flipped on top of patient. EXAM: PORTABLE PELVIS 1-2 VIEWS COMPARISON:  None. FINDINGS: The left hip and lateral aspect of the left iliac crest are not included on this film. There are degenerative changes in the right hip with  osteophytes and mild loss of joint space. The left femoral head is unremarkable. Pelvic bones are unremarkable. No other bony or soft tissue abnormalities identified. IMPRESSION: The left hip and lateral left iliac crest are not included on this study. Degenerative changes in the right hip. No other abnormalities. Electronically Signed   By: Dorise Bullion III M.D.   On: 06/22/2021 15:43   CT CHEST ABDOMEN PELVIS W CONTRAST  Result Date: 06/22/2021 CLINICAL DATA:  Trauma. EXAM: CT CHEST, ABDOMEN, AND PELVIS WITH CONTRAST TECHNIQUE: Multidetector CT imaging of the chest, abdomen and pelvis was performed following the standard protocol during bolus administration of intravenous contrast. Reformats of the thoracic and lumbar spine were performed from CT chest abdomen and pelvis and viewed separately. CONTRAST:  80 mL Omnipaque 300 COMPARISON:  CT chest abdomen and pelvis 06/22/2019. FINDINGS: CT CHEST FINDINGS Cardiovascular: The heart is mildly enlarged, similar to prior. Aorta is normal in size. There is calcified atherosclerotic disease throughout the aorta. There is no pericardial effusion. Mediastinum/Nodes: No enlarged mediastinal, hilar, or axillary lymph nodes. Thyroid gland, trachea, and esophagus demonstrate no significant findings. Lungs/Pleura: Again seen is pleuroparenchymal scarring in both lung apices. There is minimal dependent atelectasis in the lung bases. There is a 6 mm nodular density in the left lower lobe image 5/112, new from the prior study. No pleural effusion or pneumothorax. Musculoskeletal: There are new vertebroplasty changes at T12. Compression deformity of T12 appears grossly unchanged. No acute fractures are seen. CT ABDOMEN PELVIS FINDINGS Hepatobiliary: The liver is mildly enlarged with nodular liver contour compatible with cirrhosis, unchanged from the prior examination. No focal liver lesions are identified. Gallstones are again seen. There is no biliary ductal dilatation.  Pancreas: Unremarkable. No pancreatic ductal dilatation or surrounding inflammatory changes. Spleen: Normal in size without focal abnormality. Adrenals/Urinary Tract: Severe right renal atrophy appears unchanged. The left kidney is within normal limits. The bilateral adrenal glands are within normal limits. There is no hydronephrosis or perinephric fluid. Bladder appears normal. Stomach/Bowel: Stomach is within normal limits. Appendix appears normal. No evidence of bowel wall thickening, distention, or inflammatory changes. Vascular/Lymphatic: Aortic atherosclerosis. No enlarged abdominal or pelvic lymph nodes. 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 Reproductive: Prostate is unremarkable. Other: There is no evidence for ascites or focal abdominal wall hernia. There is no retroperitoneal hernia. There is no focal body wall hematoma. Musculoskeletal: Degenerative changes affect the spine and hips. Please see below. CT thoracic spine findings There are new vertebroplasty changes at T12. No acute compression fractures are seen. There is no retropulsion of fracture fragments. The bones are osteopenic. There is disc space narrowing and endplate osteophyte formation throughout the thoracic spine compatible with mild degenerative change which is similar to the prior study. There is no significant central canal or neural foraminal stenosis. Perivertebral soft tissues are within limits. There is no central spinal canal hematoma. CT lumbar spine findings There is acute comminuted fracture through L2 vertebral body. There is approximately 25% loss vertebral body height. There is retropulsion of fracture fragments along the superior endplate measuring 3 mm. There is no significant central canal stenosis. There is no neural foraminal stenosis. There is also mild acute compression fracture of the superior endplate of L2 without retropulsion of fracture fragments. There is 5% vertebral body height loss. Spinal alignment is within  normal limits. There is mild disc space narrowing at L5-S1 compatible with degenerative change. Disc spaces are otherwise well maintained. There is mild bilateral neural foraminal stenosis, left greater than right at L4-L5 and L5-S1 secondary to facet arthropathy. Paravertebral soft tissues are within normal limits. There is no definite central spinal canal hematoma. IMPRESSION: 1. Acute fracture L3 vertebral body with mild retropulsion of fracture fragments along the superior endplate. Mild central spinal canal stenosis. 2. Mild acute compression fracture superior endplate of L2. No retropulsion of fracture fragments. 3. No acute additional posttraumatic sequelae in the chest, abdomen or pelvis. 4. Liver cirrhosis. 5. Cholelithiasis. 6. New 6  mm left lower lobe nodule Non-contrast chest CT at 6-12 months is recommended. If the nodule is stable at time of repeat CT, then future CT at 18-24 months (from today's scan) is considered optional for low-risk patients, but is recommended for high-risk patients. This recommendation follows the consensus statement: Guidelines for Management of Incidental Pulmonary Nodules Detected on CT Images: From the Fleischner Society 2017; Radiology 2017; 284:228-243. Electronically Signed   By: Ronney Asters M.D.   On: 06/22/2021 16:25   CT T-SPINE NO CHARGE  Result Date: 06/22/2021 CLINICAL DATA:  Trauma. EXAM: CT CHEST, ABDOMEN, AND PELVIS WITH CONTRAST TECHNIQUE: Multidetector CT imaging of the chest, abdomen and pelvis was performed following the standard protocol during bolus administration of intravenous contrast. Reformats of the thoracic and lumbar spine were performed from CT chest abdomen and pelvis and viewed separately. CONTRAST:  80 mL Omnipaque 300 COMPARISON:  CT chest abdomen and pelvis 06/22/2019. FINDINGS: CT CHEST FINDINGS Cardiovascular: The heart is mildly enlarged, similar to prior. Aorta is normal in size. There is calcified atherosclerotic disease throughout  the aorta. There is no pericardial effusion. Mediastinum/Nodes: No enlarged mediastinal, hilar, or axillary lymph nodes. Thyroid gland, trachea, and esophagus demonstrate no significant findings. Lungs/Pleura: Again seen is pleuroparenchymal scarring in both lung apices. There is minimal dependent atelectasis in the lung bases. There is a 6 mm nodular density in the left lower lobe image 5/112, new from the prior study. No pleural effusion or pneumothorax. Musculoskeletal: There are new vertebroplasty changes at T12. Compression deformity of T12 appears grossly unchanged. No acute fractures are seen. CT ABDOMEN PELVIS FINDINGS Hepatobiliary: The liver is mildly enlarged with nodular liver contour compatible with cirrhosis, unchanged from the prior examination. No focal liver lesions are identified. Gallstones are again seen. There is no biliary ductal dilatation. Pancreas: Unremarkable. No pancreatic ductal dilatation or surrounding inflammatory changes. Spleen: Normal in size without focal abnormality. Adrenals/Urinary Tract: Severe right renal atrophy appears unchanged. The left kidney is within normal limits. The bilateral adrenal glands are within normal limits. There is no hydronephrosis or perinephric fluid. Bladder appears normal. Stomach/Bowel: Stomach is within normal limits. Appendix appears normal. No evidence of bowel wall thickening, distention, or inflammatory changes. Vascular/Lymphatic: Aortic atherosclerosis. No enlarged abdominal or pelvic lymph nodes. 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 Reproductive: Prostate is unremarkable. Other: There is no evidence for ascites or focal abdominal wall hernia. There is no retroperitoneal hernia. There is no focal body wall hematoma. Musculoskeletal: Degenerative changes affect the spine and hips. Please see below. CT thoracic spine findings There are new vertebroplasty changes at T12. No acute compression fractures are seen. There is no retropulsion of fracture  fragments. The bones are osteopenic. There is disc space narrowing and endplate osteophyte formation throughout the thoracic spine compatible with mild degenerative change which is similar to the prior study. There is no significant central canal or neural foraminal stenosis. Perivertebral soft tissues are within limits. There is no central spinal canal hematoma. CT lumbar spine findings There is acute comminuted fracture through L2 vertebral body. There is approximately 25% loss vertebral body height. There is retropulsion of fracture fragments along the superior endplate measuring 3 mm. There is no significant central canal stenosis. There is no neural foraminal stenosis. There is also mild acute compression fracture of the superior endplate of L2 without retropulsion of fracture fragments. There is 5% vertebral body height loss. Spinal alignment is within normal limits. There is mild disc space narrowing at L5-S1 compatible with degenerative change.  Disc spaces are otherwise well maintained. There is mild bilateral neural foraminal stenosis, left greater than right at L4-L5 and L5-S1 secondary to facet arthropathy. Paravertebral soft tissues are within normal limits. There is no definite central spinal canal hematoma. IMPRESSION: 1. Acute fracture L3 vertebral body with mild retropulsion of fracture fragments along the superior endplate. Mild central spinal canal stenosis. 2. Mild acute compression fracture superior endplate of L2. No retropulsion of fracture fragments. 3. No acute additional posttraumatic sequelae in the chest, abdomen or pelvis. 4. Liver cirrhosis. 5. Cholelithiasis. 6. New 6 mm left lower lobe nodule Non-contrast chest CT at 6-12 months is recommended. If the nodule is stable at time of repeat CT, then future CT at 18-24 months (from today's scan) is considered optional for low-risk patients, but is recommended for high-risk patients. This recommendation follows the consensus statement:  Guidelines for Management of Incidental Pulmonary Nodules Detected on CT Images: From the Fleischner Society 2017; Radiology 2017; 284:228-243. Electronically Signed   By: Ronney Asters M.D.   On: 06/22/2021 16:25   CT L-SPINE NO CHARGE  Result Date: 06/22/2021 CLINICAL DATA:  Trauma. EXAM: CT CHEST, ABDOMEN, AND PELVIS WITH CONTRAST TECHNIQUE: Multidetector CT imaging of the chest, abdomen and pelvis was performed following the standard protocol during bolus administration of intravenous contrast. Reformats of the thoracic and lumbar spine were performed from CT chest abdomen and pelvis and viewed separately. CONTRAST:  80 mL Omnipaque 300 COMPARISON:  CT chest abdomen and pelvis 06/22/2019. FINDINGS: CT CHEST FINDINGS Cardiovascular: The heart is mildly enlarged, similar to prior. Aorta is normal in size. There is calcified atherosclerotic disease throughout the aorta. There is no pericardial effusion. Mediastinum/Nodes: No enlarged mediastinal, hilar, or axillary lymph nodes. Thyroid gland, trachea, and esophagus demonstrate no significant findings. Lungs/Pleura: Again seen is pleuroparenchymal scarring in both lung apices. There is minimal dependent atelectasis in the lung bases. There is a 6 mm nodular density in the left lower lobe image 5/112, new from the prior study. No pleural effusion or pneumothorax. Musculoskeletal: There are new vertebroplasty changes at T12. Compression deformity of T12 appears grossly unchanged. No acute fractures are seen. CT ABDOMEN PELVIS FINDINGS Hepatobiliary: The liver is mildly enlarged with nodular liver contour compatible with cirrhosis, unchanged from the prior examination. No focal liver lesions are identified. Gallstones are again seen. There is no biliary ductal dilatation. Pancreas: Unremarkable. No pancreatic ductal dilatation or surrounding inflammatory changes. Spleen: Normal in size without focal abnormality. Adrenals/Urinary Tract: Severe right renal atrophy  appears unchanged. The left kidney is within normal limits. The bilateral adrenal glands are within normal limits. There is no hydronephrosis or perinephric fluid. Bladder appears normal. Stomach/Bowel: Stomach is within normal limits. Appendix appears normal. No evidence of bowel wall thickening, distention, or inflammatory changes. Vascular/Lymphatic: Aortic atherosclerosis. No enlarged abdominal or pelvic lymph nodes. 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 Reproductive: Prostate is unremarkable. Other: There is no evidence for ascites or focal abdominal wall hernia. There is no retroperitoneal hernia. There is no focal body wall hematoma. Musculoskeletal: Degenerative changes affect the spine and hips. Please see below. CT thoracic spine findings There are new vertebroplasty changes at T12. No acute compression fractures are seen. There is no retropulsion of fracture fragments. The bones are osteopenic. There is disc space narrowing and endplate osteophyte formation throughout the thoracic spine compatible with mild degenerative change which is similar to the prior study. There is no significant central canal or neural foraminal stenosis. Perivertebral soft tissues are within limits. There  is no central spinal canal hematoma. CT lumbar spine findings There is acute comminuted fracture through L2 vertebral body. There is approximately 25% loss vertebral body height. There is retropulsion of fracture fragments along the superior endplate measuring 3 mm. There is no significant central canal stenosis. There is no neural foraminal stenosis. There is also mild acute compression fracture of the superior endplate of L2 without retropulsion of fracture fragments. There is 5% vertebral body height loss. Spinal alignment is within normal limits. There is mild disc space narrowing at L5-S1 compatible with degenerative change. Disc spaces are otherwise well maintained. There is mild bilateral neural foraminal stenosis, left  greater than right at L4-L5 and L5-S1 secondary to facet arthropathy. Paravertebral soft tissues are within normal limits. There is no definite central spinal canal hematoma. IMPRESSION: 1. Acute fracture L3 vertebral body with mild retropulsion of fracture fragments along the superior endplate. Mild central spinal canal stenosis. 2. Mild acute compression fracture superior endplate of L2. No retropulsion of fracture fragments. 3. No acute additional posttraumatic sequelae in the chest, abdomen or pelvis. 4. Liver cirrhosis. 5. Cholelithiasis. 6. New 6 mm left lower lobe nodule Non-contrast chest CT at 6-12 months is recommended. If the nodule is stable at time of repeat CT, then future CT at 18-24 months (from today's scan) is considered optional for low-risk patients, but is recommended for high-risk patients. This recommendation follows the consensus statement: Guidelines for Management of Incidental Pulmonary Nodules Detected on CT Images: From the Fleischner Society 2017; Radiology 2017; 284:228-243. Electronically Signed   By: Ronney Asters M.D.   On: 06/22/2021 16:25   DG Chest Port 1 View  Result Date: 06/22/2021 CLINICAL DATA:  Pain after trauma EXAM: PORTABLE CHEST 1 VIEW COMPARISON:  None. FINDINGS: The lung apices were not completely included on the study. Within this limitation, no pneumothorax. The lungs are clear. Probable cardiomegaly. No other acute abnormalities. IMPRESSION: No abnormality seen on this limited study.  See above for details. Electronically Signed   By: Dorise Bullion III M.D.   On: 06/22/2021 15:54    Procedures Procedures   Medications Ordered in ED Medications  acetaminophen (TYLENOL) tablet 650 mg (650 mg Oral Given 06/23/21 0109)    Or  acetaminophen (TYLENOL) suppository 650 mg ( Rectal See Alternative 06/23/21 0109)  ondansetron (ZOFRAN) tablet 4 mg (has no administration in time range)    Or  ondansetron (ZOFRAN) injection 4 mg (has no administration in time  range)  HYDROmorphone (DILAUDID) injection 0.5-1 mg (1 mg Intravenous Given 06/23/21 0652)  rosuvastatin (CRESTOR) tablet 40 mg (40 mg Oral Given 06/23/21 1133)  enalapril (VASOTEC) tablet 20 mg (20 mg Oral Given 06/23/21 1207)  sertraline (ZOLOFT) tablet 100 mg (100 mg Oral Given 06/23/21 1134)  iohexol (OMNIPAQUE) 300 MG/ML solution 80 mL (80 mLs Intravenous Contrast Given 06/22/21 1602)  acetaminophen (TYLENOL) tablet 1,000 mg (1,000 mg Oral Given 06/22/21 1752)  HYDROmorphone (DILAUDID) injection 0.5 mg (0.5 mg Intravenous Given 06/22/21 1754)  HYDROmorphone (DILAUDID) injection 0.5 mg (0.5 mg Intravenous Given 06/22/21 2247)    ED Course  I have reviewed the triage vital signs and the nursing notes.  Pertinent labs & imaging results that were available during my care of the patient were reviewed by me and considered in my medical decision making (see chart for details).    MDM Rules/Calculators/A&P  75 y.o. male with past medical history as above who presents for evaluation of injuries sustained in a lawn-mover accident. Primary and secondary surveys were performed.  Afebrile, ABC intact and hemodynamically stable.  Exam as detailed above.  Full trauma scans were performed due to mechanism of injury and age.  CT imaging was notable for acute fracture of the L3 vertebral body with mild retropulsion along the superior endplate with associated mild central spinal stenosis.  There is also mild acute compression fracture of the superior endplate of L2.  Neurosurgery was consulted for further recommendations, who recommend TLSO brace and WBAT. Patient will not require operative intervention.  Patient was placed in TLSO brace but continues to have persistent intractable pain despite treatment with IV narcotics.  He also has increased difficulty with ambulation.  Patient will require hospital admission for PT/OT and pain control, with possible placement.  Hospitalist accepted  patient for admission.  Final Clinical Impression(s) / ED Diagnoses Final diagnoses:  Chest pain  Contact with powered lawnmower as cause of accidental injury, initial encounter    Rx / DC Orders ED Discharge Orders     None        Violet Baldy, MD 06/23/21 1335    Blanchie Dessert, MD 06/25/21 1253

## 2021-06-22 NOTE — Progress Notes (Signed)
   06/22/21 1500  Clinical Encounter Type  Visited With Patient  Visit Type Initial  Referral From Nurse  Consult/Referral To Chaplain  Stress Factors  Patient Stress Factors None identified  Family Stress Factors None identified  Chaplain attended to incoming Trauma patient in ED - no emotional or spiritual distress exhibited.  No family present.   Will remain available as needed.  Rev. Mammie Lorenzo

## 2021-06-22 NOTE — H&P (Addendum)
History and Physical    Cody Hines. [id number] DOB: 1946/02/14 DOA: 06/22/2021   PCP: [name], MD  Patient coming from: Home  I have personally briefly reviewed patient's old medical records in [place]  Chief Complaint: [name]  HPI: Cody Hines. is a 75 y.o. male with medical history significant of HTN, OSA on CPAP, MCI, prior T12 compression fx s/p vertebroplasty by Dr. Arnoldo Morale in Dec 2020.  Pt presents to the ED as a trauma 1 after his lawn mower flipped and trapped him under it for about 15 mins.  Severe lower back pain following injury but no new neurologic deficit.  Back pain is midline, severe, worse with palpation or movement.  Small abrasions over back and forearms.  No fevers, chills.   ED Course: L3 Fx with retropulsion, L2 mild compression deformity.  Trauma has signed off.  NS said non-op management, TLSO brace, pain control.   Review of Systems: As per HPI, otherwise all review of systems negative.  Past Medical History:  Diagnosis Date   BPH (benign prostatic hyperplasia)    Fibromyalgia    GERD (gastroesophageal reflux disease)    Hyperlipidemia    Hypertension    Hypogonadism male    IBS (irritable bowel syndrome)    OSA (obstructive sleep apnea)     Past Surgical History:  Procedure Laterality Date    nasal smr np3  1985   CATARACT EXTRACTION, BILATERAL Bilateral 2021   Dr. Katy Fitch, L 5/27, R 3/25   KNEE ARTHROSCOPY Left 1999   SKIN CANCER EXCISION  2020   nose   SPINE SURGERY  2007   L5 S 1 Disk   SPINE SURGERY  09/2019   Compression fracture stabilization by Dr. Eber Jones  1983     reports that he quit smoking about 40 years ago. His smoking use included cigarettes. He has never used smokeless tobacco. He reports that he does not drink alcohol and does not use drugs.  Allergies  Allergen Reactions   Asa [Aspirin] Diarrhea, Nausea Only and Other (See Comments)     History of ulcers, also   Atorvastatin Other (See Comments)    Muscle aches   Celebrex [Celecoxib] Other (See Comments)    Headaches    Codeine Nausea And Vomiting   Savella [Milnacipran Hcl] Other (See Comments)    Reaction not recalled   Viagra [Sildenafil Citrate] Other (See Comments)    Headaches   Penicillins Rash and Other (See Comments)    Family History  Problem Relation Age of Onset   Cancer Mother        breast   Cancer Father        lung   Diabetes Father    Heart disease Sister    Arthritis Sister      Prior to Admission medications   Medication Sig Start Date End Date Taking? Authorizing Provider  acetaminophen (TYLENOL) 650 MG CR tablet Take 650 mg by mouth every 8 (eight) hours as needed for pain (or back pain or migraines).     [provider]  aspirin EC 81 MG tablet Take 81 mg by mouth daily.    [provider]  Cholecalciferol (VITAMIN D3) 250 MCG (10000 UT) capsule Take 10,000 Units by mouth daily.    [provider]  enalapril (VASOTEC) 20 MG tablet TAKE 1 TABLET BY MOUTH  DAILY FOR BLOOD PRESSURE 04/26/21   Liane Comber, NP  Flaxseed, Linseed, (FLAX  SEED OIL) 1000 MG CAPS Take 2,000 mg by mouth daily.     [provider]  LORazepam (ATIVAN) 1 MG tablet Take 1 tablet (1 mg total) by mouth at bedtime as needed for sleep. As needed 01/21/21   Cottle, Billey Co., MD  Magnesium 500 MG TABS Take 500 mg by mouth daily.     [provider]  rosuvastatin (CRESTOR) 40 MG tablet Take  1 tablet  Daily  for Cholesterol 01/23/21   [name], MD  sertraline (ZOLOFT) 100 MG tablet Take 2 tablets (200 mg total) by mouth daily. 01/21/21   Cottle, Billey Co., MD    Physical Exam: Vitals:   06/22/21 2000 06/22/21 2030  BP: 134/62 138/62  Pulse: 70 72  Resp: 19 (!) 25  SpO2: 92% 92%     Constitutional: Uncomfortable with back pain Eyes: PERRL, lids and conjunctivae normal ENMT: Mucous membranes are moist.  Posterior pharynx clear of any exudate or lesions.Normal dentition.  Neck: normal, supple, no masses, no thyromegaly Respiratory: clear to auscultation bilaterally, no wheezing, no crackles. Normal respiratory effort. No accessory muscle use.  Cardiovascular: Regular rate and rhythm, no murmurs / rubs / gallops. No extremity edema. 2+ pedal pulses. No carotid bruits.  Abdomen: no tenderness, no masses palpated. No hepatosplenomegaly. Bowel sounds positive.  Musculoskeletal: Lumbar back pain Skin: no rashes, lesions, ulcers. No induration Neurologic: CN 2-12 grossly intact. Sensation intact, DTR normal. Strength 5/5 in all 4.  Psychiatric: Normal judgment and insight. Alert and oriented x 3. Normal mood.    Labs on Admission: I have personally reviewed following labs and imaging studies  CBC: No results for input(s): WBC, NEUTROABS, HGB, HCT, MCV, PLT in the last 168 hours. Basic Metabolic Panel: No results for input(s): NA, K, CL, CO2, GLUCOSE, BUN, CREATININE, CALCIUM, MG, PHOS in the last 168 hours. GFR: CrCl cannot be calculated (Patient's most recent lab result is older than the maximum 21 days allowed.). Liver Function Tests: No results for input(s): AST, ALT, ALKPHOS, BILITOT, PROT, ALBUMIN in the last 168 hours. No results for input(s): LIPASE, AMYLASE in the last 168 hours. No results for input(s): AMMONIA in the last 168 hours. Coagulation Profile: No results for input(s): INR, PROTIME in the last 168 hours. Cardiac Enzymes: No results for input(s): CKTOTAL, CKMB, CKMBINDEX, TROPONINI in the last 168 hours. BNP (last 3 results) No results for input(s): PROBNP in the last 8760 hours. HbA1C: No results for input(s): HGBA1C in the last 72 hours. CBG: No results for input(s): GLUCAP in the last 168 hours. Lipid Profile: No results for input(s): CHOL, HDL, LDLCALC, TRIG, CHOLHDL, LDLDIRECT in the last 72 hours. Thyroid Function Tests: No results for input(s): TSH, T4TOTAL,  FREET4, T3FREE, THYROIDAB in the last 72 hours. Anemia Panel: No results for input(s): VITAMINB12, FOLATE, FERRITIN, TIBC, IRON, RETICCTPCT in the last 72 hours. Urine analysis:    Component Value Date/Time   COLORURINE YELLOW 01/09/2021 [place] 01/09/2021 0959   LABSPEC 1.011 01/09/2021 0959   PHURINE 8.0 01/09/2021 [place] 01/09/2021 0959   HGBUR NEGATIVE 01/09/2021 [place] 06/22/2019 2350   KETONESUR NEGATIVE 01/09/2021 0959   PROTEINUR NEGATIVE 01/09/2021 0959   UROBILINOGEN 0.2 05/05/2014 1114   NITRITE NEGATIVE 01/09/2021 0959   LEUKOCYTESUR NEGATIVE 01/09/2021 0959    Radiological Exams on Admission: No results found.  EKG: Independently reviewed.  Assessment/Plan Patient Active Problem List   Diagnosis Date Noted   HTN (  hypertension) 06/22/2021   Closed L2 vertebral fracture ([place]) 06/22/2021   Closed L3 vertebral fracture ([place]) 06/22/2021   Contact with powered lawnmower as cause of accidental injury at home as place of occurrence 06/22/2021   CKD (chronic kidney disease) stage 3, GFR 30-59 ml/min (HCC) 06/22/2021   OSA on CPAP 06/22/2021   MCI (mild cognitive impairment) 06/22/2021   Pulmonary nodule 06/22/2021       Lawnmower rollover causing L3 Fx with retropulsion and mild L2 compression fx - Per Dr. Kathyrn Sheriff: Stable fx TLSO brace Needs PT/OT Dilaudid PRN pain Pulmonary nodule on todays imaging - Needs follow up CT 6-12 months Baseline gait disturbance - (also MCI) ? Of early PD, see [place] office notes from throughout this year, most recently in June. HTN - Resume enalapril in AM CKD 3 - Baseline creat 1.4, only slightly above today Repeat BMP in AM  DVT prophylaxis: SCDs Code Status: Full Family Communication: No family in room Disposition Plan: TBD pending PT/OT eval Consults called: Dr. Kathyrn Sheriff Admission status: Place in obs    Cody Hines, [facility]  How to  contact the [facility] Attending or Consulting provider [place] or covering provider during after hours [place], for this patient?  Check the care team in [facility] and look for a) attending/consulting TRH provider listed and b) the [facility] team listed Log into [email or web address]  Amion Physician Scheduling and messaging for groups and whole hospitals  On call and physician scheduling software for group practices, residents, hospitalists and other medical providers for call, clinic, rotation and shift schedules. OnCall [employer] is a hospital-wide system for scheduling doctors and paging doctors on call. EasyPlot is for scientific plotting and data analysis.  [email or web address]  and use [place]'s universal password to access. If you do not have the password, please contact the hospital operator.  Locate the [facility] provider you are looking for under [facility] and page to a number that you can be directly reached. If you still have difficulty reaching the provider, please page the [facility] (Director on Call) for the Hospitalists listed on amion for assistance.  06/22/2021, 8:36 PM

## 2021-06-23 ENCOUNTER — Other Ambulatory Visit: Payer: Self-pay

## 2021-06-23 ENCOUNTER — Encounter (HOSPITAL_COMMUNITY): Payer: Self-pay | Admitting: Family Medicine

## 2021-06-23 DIAGNOSIS — W19XXXA Unspecified fall, initial encounter: Secondary | ICD-10-CM | POA: Diagnosis present

## 2021-06-23 DIAGNOSIS — N179 Acute kidney failure, unspecified: Secondary | ICD-10-CM | POA: Diagnosis present

## 2021-06-23 DIAGNOSIS — E440 Moderate protein-calorie malnutrition: Secondary | ICD-10-CM | POA: Diagnosis present

## 2021-06-23 DIAGNOSIS — N400 Enlarged prostate without lower urinary tract symptoms: Secondary | ICD-10-CM | POA: Diagnosis present

## 2021-06-23 DIAGNOSIS — Z8249 Family history of ischemic heart disease and other diseases of the circulatory system: Secondary | ICD-10-CM | POA: Diagnosis not present

## 2021-06-23 DIAGNOSIS — Y92009 Unspecified place in unspecified non-institutional (private) residence as the place of occurrence of the external cause: Secondary | ICD-10-CM | POA: Diagnosis not present

## 2021-06-23 DIAGNOSIS — Z833 Family history of diabetes mellitus: Secondary | ICD-10-CM | POA: Diagnosis not present

## 2021-06-23 DIAGNOSIS — Z20822 Contact with and (suspected) exposure to covid-19: Secondary | ICD-10-CM | POA: Diagnosis present

## 2021-06-23 DIAGNOSIS — S32029A Unspecified fracture of second lumbar vertebra, initial encounter for closed fracture: Secondary | ICD-10-CM | POA: Diagnosis not present

## 2021-06-23 DIAGNOSIS — Z888 Allergy status to other drugs, medicaments and biological substances status: Secondary | ICD-10-CM | POA: Diagnosis not present

## 2021-06-23 DIAGNOSIS — N1832 Chronic kidney disease, stage 3b: Secondary | ICD-10-CM | POA: Diagnosis present

## 2021-06-23 DIAGNOSIS — W28XXXA Contact with powered lawn mower, initial encounter: Secondary | ICD-10-CM | POA: Diagnosis not present

## 2021-06-23 DIAGNOSIS — Z801 Family history of malignant neoplasm of trachea, bronchus and lung: Secondary | ICD-10-CM | POA: Diagnosis not present

## 2021-06-23 DIAGNOSIS — R911 Solitary pulmonary nodule: Secondary | ICD-10-CM | POA: Diagnosis present

## 2021-06-23 DIAGNOSIS — M4856XA Collapsed vertebra, not elsewhere classified, lumbar region, initial encounter for fracture: Secondary | ICD-10-CM | POA: Diagnosis present

## 2021-06-23 DIAGNOSIS — Z880 Allergy status to penicillin: Secondary | ICD-10-CM | POA: Diagnosis not present

## 2021-06-23 DIAGNOSIS — E785 Hyperlipidemia, unspecified: Secondary | ICD-10-CM | POA: Diagnosis present

## 2021-06-23 DIAGNOSIS — I8510 Secondary esophageal varices without bleeding: Secondary | ICD-10-CM | POA: Diagnosis present

## 2021-06-23 DIAGNOSIS — Z8261 Family history of arthritis: Secondary | ICD-10-CM | POA: Diagnosis not present

## 2021-06-23 DIAGNOSIS — I129 Hypertensive chronic kidney disease with stage 1 through stage 4 chronic kidney disease, or unspecified chronic kidney disease: Secondary | ICD-10-CM | POA: Diagnosis present

## 2021-06-23 DIAGNOSIS — Z885 Allergy status to narcotic agent status: Secondary | ICD-10-CM | POA: Diagnosis not present

## 2021-06-23 DIAGNOSIS — Z803 Family history of malignant neoplasm of breast: Secondary | ICD-10-CM | POA: Diagnosis not present

## 2021-06-23 DIAGNOSIS — G4733 Obstructive sleep apnea (adult) (pediatric): Secondary | ICD-10-CM | POA: Diagnosis present

## 2021-06-23 DIAGNOSIS — S32039A Unspecified fracture of third lumbar vertebra, initial encounter for closed fracture: Secondary | ICD-10-CM | POA: Diagnosis present

## 2021-06-23 DIAGNOSIS — Z87891 Personal history of nicotine dependence: Secondary | ICD-10-CM | POA: Diagnosis not present

## 2021-06-23 DIAGNOSIS — Z886 Allergy status to analgesic agent status: Secondary | ICD-10-CM | POA: Diagnosis not present

## 2021-06-23 DIAGNOSIS — Z85828 Personal history of other malignant neoplasm of skin: Secondary | ICD-10-CM | POA: Diagnosis not present

## 2021-06-23 DIAGNOSIS — N183 Chronic kidney disease, stage 3 unspecified: Secondary | ICD-10-CM | POA: Diagnosis not present

## 2021-06-23 LAB — BASIC METABOLIC PANEL
Anion gap: 8 (ref 5–15)
BUN: 12 mg/dL (ref 8–23)
CO2: 25 mmol/L (ref 22–32)
Calcium: 9.3 mg/dL (ref 8.9–10.3)
Chloride: 106 mmol/L (ref 98–111)
Creatinine, Ser: 1.55 mg/dL — ABNORMAL HIGH (ref 0.61–1.24)
GFR, Estimated: 47 mL/min — ABNORMAL LOW (ref >60)
Glucose, Bld: 126 mg/dL — ABNORMAL HIGH (ref 70–99)
Potassium: 4.1 mmol/L (ref 3.5–5.1)
Sodium: 139 mmol/L (ref 135–145)

## 2021-06-23 LAB — CBC
HCT: 40.7 % (ref 39.0–52.0)
Hemoglobin: 13.9 g/dL (ref 13.0–17.0)
MCH: 31.4 pg (ref 26.0–34.0)
MCHC: 34.2 g/dL (ref 30.0–36.0)
MCV: 91.9 fL (ref 80.0–100.0)
Platelets: 100 10*3/uL — ABNORMAL LOW (ref 150–400)
RBC: 4.43 MIL/uL (ref 4.22–5.81)
RDW: 13.6 % (ref 11.5–15.5)
WBC: 7.2 10*3/uL (ref 4.0–10.5)
nRBC: 0 % (ref 0.0–0.2)

## 2021-06-23 MED ORDER — ENSURE ENLIVE PO LIQD
237.0000 mL | Freq: Two times a day (BID) | ORAL | Status: DC
  Administered 2021-06-24 – 2021-06-28 (×9): 237 mL via ORAL

## 2021-06-23 NOTE — Evaluation (Signed)
Occupational Therapy Evaluation Patient Details Name: Cody Hines. MRN: [id number] DOB: [birth year] Today's Date: 06/23/2021   History of Present Illness 75 y.o. male presented to the ED 06/22/21 after his lawn mower flipped and trapped him under it for about 15 mins. L3 Fx with retropulsion and mild L2 compression fx--both stable per neurosurgery.  PMH  significant of HTN, OSA on CPAP, MCI, prior T12 compression fx s/p vertebroplasty Dec 2020, ?early PD   Clinical Impression   Pt admitted with above. He demonstrates the below listed deficits and will benefit from continued OT to maximize safety and independence with BADLs.  Pt presents with increased pain, decreased knowledge of precautions, decreased activity tolerance.  He currently requires min - mod A +2 for bed mobility and standing/side stepping up the edge of the stretcher, and requires mod - total A for ADLs.  He lives alone and reports he was fully independent with ADLs and IADLs, but reports a h/o falls.   Recommend SNF level rehab at discharge.  BP supine 131/73; seated 110/72; 127/66.  Sp02 on RA 86%, and 88-93% with 2L during activity        Recommendations for follow up therapy are one component of a multi-disciplinary discharge planning process, led by the attending physician.  Recommendations may be updated based on patient status, additional functional criteria and insurance authorization.   Follow Up Recommendations  Supervision/Assistance - 24 hour;SNF    Equipment Recommendations  None recommended by OT    Recommendations for Other Services       Precautions / Restrictions Precautions Precautions: Back;Fall;Cervical Precaution Booklet Issued: No Precaution Comments: reviewed back precautions with pt and daughter Required Braces or Orthoses: Cervical Brace;Spinal Brace Cervical Brace: Hard collar;At all times Spinal Brace: Thoracolumbosacral orthotic;Applied in sitting position Restrictions Weight  Bearing Restrictions: No      Mobility Bed Mobility Overal bed mobility: Needs Assistance Bed Mobility: Rolling;Sidelying to Sit;Sit to Sidelying Rolling: Mod assist Sidelying to sit: Mod assist;+2 for physical assistance;+2 for safety/equipment     Sit to sidelying: Mod assist;+2 for physical assistance;+2 for safety/equipment General bed mobility comments: pt limited by pain.  He required cues for log rolling and sequencing    Transfers Overall transfer level: Needs assistance Equipment used: Rolling walker (2 wheeled) Transfers: Sit to/from [facility] Sit to Stand: Min assist;+2 physical assistance;+2 safety/equipment         General transfer comment: Pt stood from ED stretcher with min A +2 and side stepped up EOB with min A +2 and mod cues for sequencing    Balance Overall balance assessment: Needs assistance Sitting-balance support: Feet supported;Single extremity supported Sitting balance-Leahy Scale: Fair     Standing balance support: Bilateral upper extremity supported Standing balance-Leahy Scale: Poor Standing balance comment: requires UE support                           ADL either performed or assessed with clinical judgement   ADL Overall ADL's : Needs assistance/impaired Eating/Feeding: Minimal assistance;Bed level   Grooming: Wash/dry hands;Wash/dry face;Oral care;Brushing hair;Moderate assistance;Bed level   Upper Body Bathing: Maximal assistance;Bed level   Lower Body Bathing: Total assistance;Sit to/from stand   Upper Body Dressing : Total assistance;Sitting   Lower Body Dressing: Total assistance;Sit to/from stand   Toilet Transfer: Total assistance Toilet Transfer Details (indicate cue type and reason): unable to perform due to increased pain Toileting- Clothing Manipulation and Hygiene: Total assistance;Sit to/from stand  Functional mobility during ADLs: Minimal assistance;Rolling walker;Cueing for  sequencing;Cueing for safety;+2 for physical assistance General ADL Comments: Pt limited by pain and orthostasis     Vision Patient Visual Report: No change from baseline       Perception     Praxis      Pertinent Vitals/Pain Pain Assessment: 0-10 Pain Score: 6  Pain Location: back Pain Descriptors / Indicators: Grimacing;Guarding Pain Intervention(s): Monitored during session;Limited activity within patient's tolerance;Repositioned     Hand Dominance Right   Extremity/Trunk Assessment Upper Extremity Assessment Upper Extremity Assessment: Generalized weakness   Lower Extremity Assessment Lower Extremity Assessment: Defer to PT evaluation   Cervical / Trunk Assessment Cervical / Trunk Assessment: Other exceptions Cervical / Trunk Exceptions: L3 fx; cervical spine immobilized in cervical collar   Communication Communication Communication: No difficulties   Cognition Arousal/Alertness: Awake/alert Behavior During Therapy: Flat affect Overall Cognitive Status: Impaired/Different from baseline Area of Impairment: Attention;Following commands;Problem solving                   Current Attention Level: Sustained;Selective   Following Commands: Follows one step commands consistently;Follows one step commands with increased time;Follows multi-step commands inconsistently     Problem Solving: Slow processing;Difficulty sequencing;Requires verbal cues General Comments: Pt is slow to process info.  He does have a h/o of mild cognitive impairment   General Comments  BP supine 131/73; seated 110/72; standing 127/66.  Sp02 on RA 86%, on 2L 88-93% during activity    Exercises     Shoulder Instructions      Home Living Family/patient expects to be discharged to:: Private residence Living Arrangements: Alone Available Help at Discharge: Family;Available PRN/intermittently Type of Home: House Home Access: Stairs to enter Entrance Stairs-Number of Steps: 3   Home  Layout: Multi-level (tri level)     Bathroom Shower/Tub: Occupational psychologist: Standard     Home Equipment: Environmental consultant - 2 wheels;Cane - single point;Shower seat - built in   Additional Comments: Pt lives alone      Prior Functioning/Environment Level of Independence: Independent        Comments: Pt reports h/o falls and was followed by OPPT in the spring.  He reports he was fully independent PTA        OT Problem List: Decreased strength;Decreased activity tolerance;Impaired balance (sitting and/or standing);Decreased cognition;Decreased safety awareness;Decreased knowledge of use of DME or AE;Decreased knowledge of precautions;Cardiopulmonary status limiting activity;Pain      OT Treatment/Interventions: Self-care/ADL training;DME and/or AE instruction;Therapeutic activities;Cognitive remediation/compensation;Patient/family education;Balance training    OT Goals(Current goals can be found in the care plan section) Acute Rehab OT Goals Patient Stated Goal: to have less pain OT Goal Formulation: With patient/family Time For Goal Achievement: 07/07/21 Potential to Achieve Goals: Good ADL Goals Pt Will Perform Eating: with modified independence;sitting Pt Will Perform Grooming: standing;with min guard assist Pt Will Perform Upper Body Bathing: with set-up;with supervision;sitting Pt Will Perform Lower Body Bathing: with min assist;with adaptive equipment;sit to/from stand Pt Will Perform Upper Body Dressing: with set-up;with supervision;sitting Pt Will Perform Lower Body Dressing: with min assist;with adaptive equipment;sit to/from stand Pt Will Transfer to Toilet: with min assist;ambulating;regular height toilet;bedside commode;grab bars Pt Will Perform Toileting - Clothing Manipulation and hygiene: with min assist;sit to/from stand;with adaptive equipment  OT Frequency: Min 2X/week   Barriers to D/C: Decreased caregiver support  family lives out of town        Co-evaluation PT/OT/SLP Co-Evaluation/Treatment: Yes Reason for Co-Treatment: For Scientist, research (physical sciences)  safety;To address functional/ADL transfers   OT goals addressed during session: ADL's and self-care;Strengthening/ROM      AM-PAC OT "6 Clicks" Daily Activity     Outcome Measure Help from another person eating meals?: A Little Help from another person taking care of personal grooming?: A Lot Help from another person toileting, which includes using toliet, bedpan, or urinal?: Total Help from another person bathing (including washing, rinsing, drying)?: A Lot Help from another person to put on and taking off regular upper body clothing?: Total Help from another person to put on and taking off regular lower body clothing?: Total 6 Click Score: 10   End of Session Equipment Utilized During Treatment: Gait belt;Rolling walker;Back brace;Cervical collar;Oxygen Nurse Communication: Mobility status;Precautions  Activity Tolerance: Patient limited by pain Patient left: in bed;with call bell/phone within reach;with family/visitor present  OT Visit Diagnosis: Unsteadiness on feet (R26.81);Pain Pain - part of body:  (back)                Time: 1027-1110 OT Time Calculation (min): 43 min Charges:  OT General Charges $OT Visit: 1 Visit OT Evaluation $OT Eval Moderate Complexity: 1 Mod OT Treatments $Therapeutic Activity: 8-22 mins  Nilsa Nutting., OTR/L Acute Rehabilitation Services Pager [phone] Office [place], [place] 06/23/2021, 12:42 PM

## 2021-06-23 NOTE — Evaluation (Signed)
Physical Therapy Evaluation Patient Details Name: Cody Hines. MRN: [id number] DOB: [birth year] Today's Date: 06/23/2021  History of Present Illness  75 y.o. male presented to the ED 06/22/21 after his lawn mower flipped and trapped him under it for about 15 mins. L3 Fx with retropulsion and mild L2 compression fx--both stable per neurosurgery.  PMH  significant of HTN, OSA on CPAP, MCI, prior T12 compression fx s/p vertebroplasty Dec 2020, ?early PD  Clinical Impression   Pt admitted secondary to problem above with deficits below. PTA patient was living alone, however has history of falls and reports he fell x2 on day of admission prior to accident with lawnmower.  Pt currently requires +2 moderate assist for bed mobility and transfer, however was unable to ambulate due to decreased BP and feeling weak, lightheaded with request to return to sitting.  Anticipate patient will benefit from PT to address problems listed below.Will continue to follow acutely to maximize functional mobility independence and safety.          Recommendations for follow up therapy are one component of a multi-disciplinary discharge planning process, led by the attending physician.  Recommendations may be updated based on patient status, additional functional criteria and insurance authorization.  Follow Up Recommendations SNF (family present and cannot provide level of care pt requires at this time)    Equipment Recommendations  Rolling walker with 5" wheels    Recommendations for Other Services       Precautions / Restrictions Precautions Precautions: Back;Fall;Cervical Precaution Booklet Issued: No Precaution Comments: reviewed back precautions with pt and daughter Required Braces or Orthoses: Cervical Brace;Spinal Brace Cervical Brace: Hard collar;At all times Spinal Brace: Thoracolumbosacral orthotic;Applied in sitting position Restrictions Weight Bearing Restrictions: No      Mobility   Bed Mobility Overal bed mobility: Needs Assistance Bed Mobility: Rolling;Sidelying to Sit;Sit to Sidelying Rolling: Mod assist Sidelying to sit: Mod assist;+2 for physical assistance;+2 for safety/equipment     Sit to sidelying: Mod assist;+2 for physical assistance;+2 for safety/equipment General bed mobility comments: pt limited by pain.  He required cues for log rolling and sequencing    Transfers Overall transfer level: Needs assistance Equipment used: Rolling walker (2 wheeled) Transfers: Sit to/from [facility] Sit to Stand: Min assist;+2 physical assistance;+2 safety/equipment;From elevated surface         General transfer comment: Pt stood from ED stretcher with min A +2 and side stepped up EOB with min A +2 and mod cues for sequencing  Ambulation/Gait Ambulation/Gait assistance: Min assist;+2 physical assistance;+2 safety/equipment Gait Distance (Feet): 2 Feet Assistive device: Rolling walker (2 wheeled) Gait Pattern/deviations: Step-to pattern     General Gait Details: side-stepping toward HOB due to decreased BP and pt feeling weak  Stairs            Wheelchair Mobility    Modified Rankin (Stroke Patients Only)       Balance Overall balance assessment: Needs assistance Sitting-balance support: Feet supported;Single extremity supported Sitting balance-Leahy Scale: Fair     Standing balance support: Bilateral upper extremity supported Standing balance-Leahy Scale: Poor Standing balance comment: requires UE support                             Pertinent Vitals/Pain Pain Assessment: 0-10 Pain Score: 6  Pain Location: back Pain Descriptors / Indicators: Grimacing;Guarding Pain Intervention(s): Limited activity within patient's tolerance;Monitored during session;Repositioned    Home Living Family/patient expects to be  discharged to:: Private residence Living Arrangements: Alone Available Help at Discharge:  Family;Available PRN/intermittently Type of Home: House Home Access: Stairs to enter   Entrance Stairs-Number of Steps: 3 Home Layout: Multi-level (tri level) Home Equipment: Walker - 2 wheels;Cane - single point;Shower seat - built in Additional Comments: Pt lives alone    Prior Function Level of Independence: Independent         Comments: Pt reports h/o falls and was followed by OPPT in the spring.  He reports he was fully independent PTA     Hand Dominance   Dominant Hand: Right    Extremity/Trunk Assessment   Upper Extremity Assessment Upper Extremity Assessment: Defer to OT evaluation    Lower Extremity Assessment Lower Extremity Assessment: Generalized weakness    Cervical / Trunk Assessment Cervical / Trunk Assessment: Other exceptions Cervical / Trunk Exceptions: L3 fx; cervical spine immobilized in cervical collar  Communication   Communication: No difficulties  Cognition Arousal/Alertness: Awake/alert Behavior During Therapy: Flat affect Overall Cognitive Status: Impaired/Different from baseline Area of Impairment: Attention;Following commands;Problem solving                   Current Attention Level: Sustained;Selective   Following Commands: Follows one step commands consistently;Follows one step commands with increased time;Follows multi-step commands inconsistently     Problem Solving: Slow processing;Difficulty sequencing;Requires verbal cues General Comments: Pt is slow to process info.  He does have a h/o of mild cognitive impairment      General Comments General comments (skin integrity, edema, etc.): BP supine 131/73; seated 110/72; standing 127/66.  Sp02 on RA 86%, on 2L 88-93% during activity    Exercises     Assessment/Plan    PT Assessment Patient needs continued PT services  PT Problem List Decreased strength;Decreased activity tolerance;Decreased balance;Decreased mobility;Decreased cognition;Decreased knowledge of use of  DME;Decreased safety awareness;Decreased knowledge of precautions;Cardiopulmonary status limiting activity;Pain       PT Treatment Interventions DME instruction;Gait training;Stair training;Functional mobility training;Therapeutic activities;Therapeutic exercise;Balance training;Cognitive remediation;Patient/family education    PT Goals (Current goals can be found in the Care Plan section)  Acute Rehab PT Goals Patient Stated Goal: to have less pain PT Goal Formulation: With patient Time For Goal Achievement: 07/07/21 Potential to Achieve Goals: Good    Frequency Min 3X/week   Barriers to discharge        Co-evaluation PT/OT/SLP Co-Evaluation/Treatment: Yes Reason for Co-Treatment: For patient/therapist safety;To address functional/ADL transfers PT goals addressed during session: Mobility/safety with mobility;Balance;Proper use of DME OT goals addressed during session: ADL's and self-care;Strengthening/ROM       AM-PAC PT "6 Clicks" Mobility  Outcome Measure Help needed turning from your back to your side while in a flat bed without using bedrails?: Total Help needed moving from lying on your back to sitting on the side of a flat bed without using bedrails?: Total Help needed moving to and from a bed to a chair (including a wheelchair)?: Total Help needed standing up from a chair using your arms (e.g., wheelchair or bedside chair)?: Total Help needed to walk in hospital room?: Total Help needed climbing 3-5 steps with a railing? : Total 6 Click Score: 6    End of Session Equipment Utilized During Treatment: Gait belt;Back brace;Oxygen Activity Tolerance: Patient limited by pain;Treatment limited secondary to medical complications (Comment) (lightheadedness) Patient left: in bed;with family/visitor present (on stretcher in ED) Nurse Communication: Mobility status;Other (comment) (unable to ambulate due to lightheadedness) PT Visit Diagnosis: Repeated falls (R29.6);Muscle  weakness (generalized) (  M62.81);Difficulty in walking, not elsewhere classified (R26.2);Dizziness and giddiness (R42)    Time: [id number] PT Time Calculation (min) (ACUTE ONLY): 40 min   Charges:   PT Evaluation $PT Eval Moderate Complexity: 1 Mod           Arby Barrette, PT Pager [phone]   Rexanne Mano 06/23/2021, 2:59 PM

## 2021-06-23 NOTE — Progress Notes (Signed)
[facility]  PROGRESS NOTE  Cody Hines. MG:[id number] DOB: 1946/06/17 DOA: 06/22/2021 PCP: [name], MD   Brief HPI:   75 year old male with history of hypertension, OSA on CPAP, prior T12 compression fracture status post vertebroplasty by Dr. Arnoldo Morale in December 2020 presented to ED after patient had trauma at home.  Patient lawnmower flipped and trapped him under it for about 15 minutes.  Patient developed severe low back pain but no neurological deficit. In the ED, CT of the lumbar spine showed L3 vertebral body fracture with mild retropulsion of fracture fragments along the superior endplate.  Neurosurgery was consulted and recommended TLSO brace.  Nonoperative management.    Subjective   Patient seen and examined, pain well controlled.   Assessment/Plan:     L3 fracture with retropulsion/mild L2 compression fracture -Caused by a lawnmower rollover -Neurosurgery Dr. Kathyrn Sheriff recommended TLSO brace -PT/OT consulted; patient will need skilled nursing facility -Continue pain management with Dilaudid as needed  Pulmonary nodule -Seen on CT thoracic spine -Will need repeat CT in 6 to 12 months  Hypertension -Continue enalapril  CKD stage III -Creatinine 1.55 -  ? at baseline     Data Reviewed:   CBG:  No results for input(s): GLUCAP in the last 168 hours.  SpO2: 91 % O2 Flow Rate (L/min): 2 L/min    Vitals:   06/23/21 1207 06/23/21 1259 06/23/21 1436 06/23/21 1514  BP: 127/66 138/67 130/62 136/62  Pulse:  78 80 83  Resp:  (!) 23 (!) 22 16  Temp:    99.2 F (37.3 C)  TempSrc:    Oral  SpO2:  93% 92% 91%  Weight:      Height:         Intake/Output Summary (Last 24 hours) at 06/23/2021 1548 Last data filed at 06/22/2021 2047 Gross per 24 hour  Intake --  Output 450 ml  Net -450 ml    09/16 1901 - 09/18 0700 In: 300 [I.V.:300] Out: 450 [Urine:450]  Filed Weights   06/22/21 1523  Weight: 88.5 kg    Data  Reviewed: Basic Metabolic Panel: Recent Labs  Lab 06/22/21 1519 06/22/21 1524 06/23/21 0410  NA 140 141 139  K 3.7 3.7 4.1  CL 107 106 106  CO2 24  --  25  GLUCOSE 189* 183* 126*  BUN '11 12 12  '$ CREATININE 1.69* 1.60* 1.55*  CALCIUM 9.6  --  9.3   Liver Function Tests: Recent Labs  Lab 06/22/21 1519  AST 46*  ALT 26  ALKPHOS 79  BILITOT 1.7*  PROT 6.4*  ALBUMIN 3.6   No results for input(s): LIPASE, AMYLASE in the last 168 hours. No results for input(s): AMMONIA in the last 168 hours. CBC: Recent Labs  Lab 06/22/21 1519 06/22/21 1524 06/23/21 0410  WBC 6.0  --  7.2  HGB 13.6 13.3 13.9  HCT 39.2 39.0 40.7  MCV 90.3  --  91.9  PLT 114*  --  100*   Cardiac Enzymes: No results for input(s): CKTOTAL, CKMB, CKMBINDEX, TROPONINI in the last 168 hours. BNP (last 3 results) No results for input(s): BNP in the last 8760 hours.  ProBNP (last 3 results) No results for input(s): PROBNP in the last 8760 hours.  CBG: No results for input(s): GLUCAP in the last 168 hours.  Recent Results (from the past 240 hour(s))  Resp Panel by RT-PCR (Flu A&B, Covid) Nasopharyngeal Swab     Status: None   Collection Time: 06/22/21  3:56  PM   Specimen: Nasopharyngeal Swab; Nasopharyngeal(NP) swabs in vial transport medium  Result Value Ref Range Status   SARS Coronavirus 2 by RT PCR NEGATIVE NEGATIVE Final    Comment: (NOTE) SARS-CoV-2 target nucleic acids are NOT DETECTED.  The SARS-CoV-2 RNA is generally detectable in upper respiratory specimens during the acute phase of infection. The lowest concentration of SARS-CoV-2 viral copies this assay can detect is 138 copies/mL. A negative result does not preclude SARS-Cov-2 infection and should not be used as the sole basis for treatment or other patient management decisions. A negative result may occur with  improper specimen collection/handling, submission of specimen other than nasopharyngeal swab, presence of viral mutation(s)  within the areas targeted by this assay, and inadequate number of viral copies(<138 copies/mL). A negative result must be combined with clinical observations, patient history, and epidemiological information. The expected result is Negative.  Fact Sheet for Patients:  EntrepreneurPulse.com.au  Fact Sheet for Healthcare Providers:  IncredibleEmployment.be  This test is no t yet approved or cleared by the [place] FDA and  has been authorized for detection and/or diagnosis of SARS-CoV-2 by FDA under an Emergency Use Authorization (EUA). This EUA will remain  in effect (meaning this test can be used) for the duration of the COVID-19 declaration under Section 564(b)(1) of the Act, 21 U.S.C.section 360bbb-3(b)(1), unless the authorization is terminated  or revoked sooner.       Influenza A by PCR NEGATIVE NEGATIVE Final   Influenza B by PCR NEGATIVE NEGATIVE Final    Comment: (NOTE) The Xpert Xpress SARS-CoV-2/FLU/RSV plus assay is intended as an aid in the diagnosis of influenza from Nasopharyngeal swab specimens and should not be used as a sole basis for treatment. Nasal washings and aspirates are unacceptable for Xpert Xpress SARS-CoV-2/FLU/RSV testing.  Fact Sheet for Patients: EntrepreneurPulse.com.au  Fact Sheet for Healthcare Providers: IncredibleEmployment.be  This test is not yet approved or cleared by the [place] FDA and has been authorized for detection and/or diagnosis of SARS-CoV-2 by FDA under an Emergency Use Authorization (EUA). This EUA will remain in effect (meaning this test can be used) for the duration of the COVID-19 declaration under Section 564(b)(1) of the Act, 21 U.S.C. section 360bbb-3(b)(1), unless the authorization is terminated or revoked.  Performed at [facility], [place] [address] [place], [place] [address]         Scheduled medications:    enalapril   20 mg Oral Daily   rosuvastatin  40 mg Oral Daily   sertraline  100 mg Oral BID    Antibiotics: Anti-infectives (From admission, onward)    None        Radiology Reports  CT HEAD WO CONTRAST  Result Date: 06/22/2021 CLINICAL DATA:  Head trauma. EXAM: CT HEAD WITHOUT CONTRAST CT CERVICAL SPINE WITHOUT CONTRAST TECHNIQUE: Multidetector CT imaging of the head and cervical spine was performed following the standard protocol without intravenous contrast. Multiplanar CT image reconstructions of the cervical spine were also generated. COMPARISON:  CT scan of the brain June 22, 2019. FINDINGS: CT HEAD FINDINGS Brain: No subdural, epidural, or subarachnoid hemorrhage. Ventricles are mildly prominent but stable. Sulci are stable. No mass effect or midline shift. No acute cortical ischemia or infarct. Cerebellum, brainstem, and basal cisterns are normal. Vascular: No hyperdense vessel or unexpected calcification. Skull: Normal. Negative for fracture or focal lesion. Sinuses/Orbits: No acute finding. Other: None. CT CERVICAL SPINE FINDINGS Alignment: Normal. Skull base and vertebrae: No acute fracture. No primary bone lesion  or focal pathologic process. Soft tissues and spinal canal: No prevertebral fluid or swelling. No visible canal hematoma. Disc levels: Multilevel degenerative disc disease, mild. Mild facet degenerative changes. Upper chest: Negative. Other: A sclerotic focus is seen in the C4 vertebral body. IMPRESSION: 1. No acute intracranial abnormalities. 2. No fracture or traumatic malalignment in the cervical spine. 3. The rounded region of sclerosis in the C4 vertebral body is nonspecific but may represent a bone island. Electronically Signed   By: Dorise Bullion III M.D.   On: 06/22/2021 15:53   CT CERVICAL SPINE WO CONTRAST  Result Date: 06/22/2021 CLINICAL DATA:  MVC, trauma EXAM: CT CERVICAL SPINE WITHOUT CONTRAST TECHNIQUE: Multidetector CT imaging of the cervical spine was  performed without intravenous contrast. Multiplanar CT image reconstructions were also generated. COMPARISON:  None. FINDINGS: Alignment: Normal. Skull base and vertebrae: No acute fracture. No suspicious primary bone lesion or focal pathologic process. Incidental small benign densely ossified bone island of C4 (series 8, image 43). Soft tissues and spinal canal: No prevertebral fluid or swelling. No visible canal hematoma. Disc levels: Minimal multilevel disc space height loss and osteophytosis throughout. Upper chest: Negative. Other: None. IMPRESSION: 1. No fracture or static subluxation of the cervical spine. 2. Minimal multilevel disc space height loss and osteophytosis throughout. Electronically Signed   By: Eddie Candle M.D.   On: 06/22/2021 15:50   DG Pelvis Portable  Result Date: 06/22/2021 CLINICAL DATA:  Riding mower flipped on top of patient. EXAM: PORTABLE PELVIS 1-2 VIEWS COMPARISON:  None. FINDINGS: The left hip and lateral aspect of the left iliac crest are not included on this film. There are degenerative changes in the right hip with osteophytes and mild loss of joint space. The left femoral head is unremarkable. Pelvic bones are unremarkable. No other bony or soft tissue abnormalities identified. IMPRESSION: The left hip and lateral left iliac crest are not included on this study. Degenerative changes in the right hip. No other abnormalities. Electronically Signed   By: Dorise Bullion III M.D.   On: 06/22/2021 15:43   CT CHEST ABDOMEN PELVIS W CONTRAST  Result Date: 06/22/2021 CLINICAL DATA:  Trauma. EXAM: CT CHEST, ABDOMEN, AND PELVIS WITH CONTRAST TECHNIQUE: Multidetector CT imaging of the chest, abdomen and pelvis was performed following the standard protocol during bolus administration of intravenous contrast. Reformats of the thoracic and lumbar spine were performed from CT chest abdomen and pelvis and viewed separately. CONTRAST:  80 mL Omnipaque 300 COMPARISON:  CT chest abdomen  and pelvis 06/22/2019. FINDINGS: CT CHEST FINDINGS Cardiovascular: The heart is mildly enlarged, similar to prior. Aorta is normal in size. There is calcified atherosclerotic disease throughout the aorta. There is no pericardial effusion. Mediastinum/Nodes: No enlarged mediastinal, hilar, or axillary lymph nodes. Thyroid gland, trachea, and esophagus demonstrate no significant findings. Lungs/Pleura: Again seen is pleuroparenchymal scarring in both lung apices. There is minimal dependent atelectasis in the lung bases. There is a 6 mm nodular density in the left lower lobe image 5/112, new from the prior study. No pleural effusion or pneumothorax. Musculoskeletal: There are new vertebroplasty changes at T12. Compression deformity of T12 appears grossly unchanged. No acute fractures are seen. CT ABDOMEN PELVIS FINDINGS Hepatobiliary: The liver is mildly enlarged with nodular liver contour compatible with cirrhosis, unchanged from the prior examination. No focal liver lesions are identified. Gallstones are again seen. There is no biliary ductal dilatation. Pancreas: Unremarkable. No pancreatic ductal dilatation or surrounding inflammatory changes. Spleen: Normal in size without focal abnormality.  Adrenals/Urinary Tract: Severe right renal atrophy appears unchanged. The left kidney is within normal limits. The bilateral adrenal glands are within normal limits. There is no hydronephrosis or perinephric fluid. Bladder appears normal. Stomach/Bowel: Stomach is within normal limits. Appendix appears normal. No evidence of bowel wall thickening, distention, or inflammatory changes. Vascular/Lymphatic: Aortic atherosclerosis. No enlarged abdominal or pelvic lymph nodes. 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 Reproductive: Prostate is unremarkable. Other: There is no evidence for ascites or focal abdominal wall hernia. There is no retroperitoneal hernia. There is no focal body wall hematoma. Musculoskeletal: Degenerative changes  affect the spine and hips. Please see below. CT thoracic spine findings There are new vertebroplasty changes at T12. No acute compression fractures are seen. There is no retropulsion of fracture fragments. The bones are osteopenic. There is disc space narrowing and endplate osteophyte formation throughout the thoracic spine compatible with mild degenerative change which is similar to the prior study. There is no significant central canal or neural foraminal stenosis. Perivertebral soft tissues are within limits. There is no central spinal canal hematoma. CT lumbar spine findings There is acute comminuted fracture through L2 vertebral body. There is approximately 25% loss vertebral body height. There is retropulsion of fracture fragments along the superior endplate measuring 3 mm. There is no significant central canal stenosis. There is no neural foraminal stenosis. There is also mild acute compression fracture of the superior endplate of L2 without retropulsion of fracture fragments. There is 5% vertebral body height loss. Spinal alignment is within normal limits. There is mild disc space narrowing at L5-S1 compatible with degenerative change. Disc spaces are otherwise well maintained. There is mild bilateral neural foraminal stenosis, left greater than right at L4-L5 and L5-S1 secondary to facet arthropathy. Paravertebral soft tissues are within normal limits. There is no definite central spinal canal hematoma. IMPRESSION: 1. Acute fracture L3 vertebral body with mild retropulsion of fracture fragments along the superior endplate. Mild central spinal canal stenosis. 2. Mild acute compression fracture superior endplate of L2. No retropulsion of fracture fragments. 3. No acute additional posttraumatic sequelae in the chest, abdomen or pelvis. 4. Liver cirrhosis. 5. Cholelithiasis. 6. New 6 mm left lower lobe nodule Non-contrast chest CT at 6-12 months is recommended. If the nodule is stable at time of repeat CT, then  future CT at 18-24 months (from today's scan) is considered optional for low-risk patients, but is recommended for high-risk patients. This recommendation follows the consensus statement: Guidelines for Management of Incidental Pulmonary Nodules Detected on CT Images: From the Fleischner Society 2017; Radiology 2017; 284:228-243. Electronically Signed   By: Ronney Asters M.D.   On: 06/22/2021 16:25   CT T-SPINE NO CHARGE  Result Date: 06/22/2021 CLINICAL DATA:  Trauma. EXAM: CT CHEST, ABDOMEN, AND PELVIS WITH CONTRAST TECHNIQUE: Multidetector CT imaging of the chest, abdomen and pelvis was performed following the standard protocol during bolus administration of intravenous contrast. Reformats of the thoracic and lumbar spine were performed from CT chest abdomen and pelvis and viewed separately. CONTRAST:  80 mL Omnipaque 300 COMPARISON:  CT chest abdomen and pelvis 06/22/2019. FINDINGS: CT CHEST FINDINGS Cardiovascular: The heart is mildly enlarged, similar to prior. Aorta is normal in size. There is calcified atherosclerotic disease throughout the aorta. There is no pericardial effusion. Mediastinum/Nodes: No enlarged mediastinal, hilar, or axillary lymph nodes. Thyroid gland, trachea, and esophagus demonstrate no significant findings. Lungs/Pleura: Again seen is pleuroparenchymal scarring in both lung apices. There is minimal dependent atelectasis in the lung bases. There is a  6 mm nodular density in the left lower lobe image 5/112, new from the prior study. No pleural effusion or pneumothorax. Musculoskeletal: There are new vertebroplasty changes at T12. Compression deformity of T12 appears grossly unchanged. No acute fractures are seen. CT ABDOMEN PELVIS FINDINGS Hepatobiliary: The liver is mildly enlarged with nodular liver contour compatible with cirrhosis, unchanged from the prior examination. No focal liver lesions are identified. Gallstones are again seen. There is no biliary ductal dilatation.  Pancreas: Unremarkable. No pancreatic ductal dilatation or surrounding inflammatory changes. Spleen: Normal in size without focal abnormality. Adrenals/Urinary Tract: Severe right renal atrophy appears unchanged. The left kidney is within normal limits. The bilateral adrenal glands are within normal limits. There is no hydronephrosis or perinephric fluid. Bladder appears normal. Stomach/Bowel: Stomach is within normal limits. Appendix appears normal. No evidence of bowel wall thickening, distention, or inflammatory changes. Vascular/Lymphatic: Aortic atherosclerosis. No enlarged abdominal or pelvic lymph nodes. 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 Reproductive: Prostate is unremarkable. Other: There is no evidence for ascites or focal abdominal wall hernia. There is no retroperitoneal hernia. There is no focal body wall hematoma. Musculoskeletal: Degenerative changes affect the spine and hips. Please see below. CT thoracic spine findings There are new vertebroplasty changes at T12. No acute compression fractures are seen. There is no retropulsion of fracture fragments. The bones are osteopenic. There is disc space narrowing and endplate osteophyte formation throughout the thoracic spine compatible with mild degenerative change which is similar to the prior study. There is no significant central canal or neural foraminal stenosis. Perivertebral soft tissues are within limits. There is no central spinal canal hematoma. CT lumbar spine findings There is acute comminuted fracture through L2 vertebral body. There is approximately 25% loss vertebral body height. There is retropulsion of fracture fragments along the superior endplate measuring 3 mm. There is no significant central canal stenosis. There is no neural foraminal stenosis. There is also mild acute compression fracture of the superior endplate of L2 without retropulsion of fracture fragments. There is 5% vertebral body height loss. Spinal alignment is within  normal limits. There is mild disc space narrowing at L5-S1 compatible with degenerative change. Disc spaces are otherwise well maintained. There is mild bilateral neural foraminal stenosis, left greater than right at L4-L5 and L5-S1 secondary to facet arthropathy. Paravertebral soft tissues are within normal limits. There is no definite central spinal canal hematoma. IMPRESSION: 1. Acute fracture L3 vertebral body with mild retropulsion of fracture fragments along the superior endplate. Mild central spinal canal stenosis. 2. Mild acute compression fracture superior endplate of L2. No retropulsion of fracture fragments. 3. No acute additional posttraumatic sequelae in the chest, abdomen or pelvis. 4. Liver cirrhosis. 5. Cholelithiasis. 6. New 6 mm left lower lobe nodule Non-contrast chest CT at 6-12 months is recommended. If the nodule is stable at time of repeat CT, then future CT at 18-24 months (from today's scan) is considered optional for low-risk patients, but is recommended for high-risk patients. This recommendation follows the consensus statement: Guidelines for Management of Incidental Pulmonary Nodules Detected on CT Images: From the Fleischner Society 2017; Radiology 2017; 284:228-243. Electronically Signed   By: Ronney Asters M.D.   On: 06/22/2021 16:25   CT L-SPINE NO CHARGE  Result Date: 06/22/2021 CLINICAL DATA:  Trauma. EXAM: CT CHEST, ABDOMEN, AND PELVIS WITH CONTRAST TECHNIQUE: Multidetector CT imaging of the chest, abdomen and pelvis was performed following the standard protocol during bolus administration of intravenous contrast. Reformats of the thoracic and lumbar spine  were performed from CT chest abdomen and pelvis and viewed separately. CONTRAST:  80 mL Omnipaque 300 COMPARISON:  CT chest abdomen and pelvis 06/22/2019. FINDINGS: CT CHEST FINDINGS Cardiovascular: The heart is mildly enlarged, similar to prior. Aorta is normal in size. There is calcified atherosclerotic disease throughout  the aorta. There is no pericardial effusion. Mediastinum/Nodes: No enlarged mediastinal, hilar, or axillary lymph nodes. Thyroid gland, trachea, and esophagus demonstrate no significant findings. Lungs/Pleura: Again seen is pleuroparenchymal scarring in both lung apices. There is minimal dependent atelectasis in the lung bases. There is a 6 mm nodular density in the left lower lobe image 5/112, new from the prior study. No pleural effusion or pneumothorax. Musculoskeletal: There are new vertebroplasty changes at T12. Compression deformity of T12 appears grossly unchanged. No acute fractures are seen. CT ABDOMEN PELVIS FINDINGS Hepatobiliary: The liver is mildly enlarged with nodular liver contour compatible with cirrhosis, unchanged from the prior examination. No focal liver lesions are identified. Gallstones are again seen. There is no biliary ductal dilatation. Pancreas: Unremarkable. No pancreatic ductal dilatation or surrounding inflammatory changes. Spleen: Normal in size without focal abnormality. Adrenals/Urinary Tract: Severe right renal atrophy appears unchanged. The left kidney is within normal limits. The bilateral adrenal glands are within normal limits. There is no hydronephrosis or perinephric fluid. Bladder appears normal. Stomach/Bowel: Stomach is within normal limits. Appendix appears normal. No evidence of bowel wall thickening, distention, or inflammatory changes. Vascular/Lymphatic: Aortic atherosclerosis. No enlarged abdominal or pelvic lymph nodes. 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 Reproductive: Prostate is unremarkable. Other: There is no evidence for ascites or focal abdominal wall hernia. There is no retroperitoneal hernia. There is no focal body wall hematoma. Musculoskeletal: Degenerative changes affect the spine and hips. Please see below. CT thoracic spine findings There are new vertebroplasty changes at T12. No acute compression fractures are seen. There is no retropulsion of fracture  fragments. The bones are osteopenic. There is disc space narrowing and endplate osteophyte formation throughout the thoracic spine compatible with mild degenerative change which is similar to the prior study. There is no significant central canal or neural foraminal stenosis. Perivertebral soft tissues are within limits. There is no central spinal canal hematoma. CT lumbar spine findings There is acute comminuted fracture through L2 vertebral body. There is approximately 25% loss vertebral body height. There is retropulsion of fracture fragments along the superior endplate measuring 3 mm. There is no significant central canal stenosis. There is no neural foraminal stenosis. There is also mild acute compression fracture of the superior endplate of L2 without retropulsion of fracture fragments. There is 5% vertebral body height loss. Spinal alignment is within normal limits. There is mild disc space narrowing at L5-S1 compatible with degenerative change. Disc spaces are otherwise well maintained. There is mild bilateral neural foraminal stenosis, left greater than right at L4-L5 and L5-S1 secondary to facet arthropathy. Paravertebral soft tissues are within normal limits. There is no definite central spinal canal hematoma. IMPRESSION: 1. Acute fracture L3 vertebral body with mild retropulsion of fracture fragments along the superior endplate. Mild central spinal canal stenosis. 2. Mild acute compression fracture superior endplate of L2. No retropulsion of fracture fragments. 3. No acute additional posttraumatic sequelae in the chest, abdomen or pelvis. 4. Liver cirrhosis. 5. Cholelithiasis. 6. New 6 mm left lower lobe nodule Non-contrast chest CT at 6-12 months is recommended. If the nodule is stable at time of repeat CT, then future CT at 18-24 months (from today's scan) is considered optional for low-risk patients,  but is recommended for high-risk patients. This recommendation follows the consensus statement:  Guidelines for Management of Incidental Pulmonary Nodules Detected on CT Images: From the Fleischner Society 2017; Radiology 2017; 284:228-243. Electronically Signed   By: Ronney Asters M.D.   On: 06/22/2021 16:25   DG Chest Port 1 View  Result Date: 06/22/2021 CLINICAL DATA:  Pain after trauma EXAM: PORTABLE CHEST 1 VIEW COMPARISON:  None. FINDINGS: The lung apices were not completely included on the study. Within this limitation, no pneumothorax. The lungs are clear. Probable cardiomegaly. No other acute abnormalities. IMPRESSION: No abnormality seen on this limited study.  See above for details. Electronically Signed   By: Dorise Bullion III M.D.   On: 06/22/2021 15:54      DVT prophylaxis: SCDs  Code Status: Full code  Family Communication: No family at bedside   Consultants:   Procedures:     Objective    Physical Examination:   General-appears in no acute distress Heart-S1-S2, regular, no murmur auscultated Lungs-clear to auscultation bilaterally, no wheezing or crackles auscultated Abdomen-soft, nontender, no organomegaly Extremities-no edema in the lower extremities Neuro-alert, oriented x3, no focal deficit noted  Status is: Inpatient  Dispo: The patient is from: Home              Anticipated d/c is to: Skilled nursing facility              Anticipated d/c date is: 06/26/2021              Patient currently not stable for discharge  Barrier to discharge-we will need skilled nursing facility for rehab  COVID-19 Labs  No results for input(s): DDIMER, FERRITIN, LDH, CRP in the last 72 hours.  Lab Results  Component Value Date   [place] NEGATIVE 06/22/2021         Oswald Hillock   [facility] If 7PM-7AM, please contact night-coverage at [email or web address], Office  [phone]   06/23/2021, 3:48 PM  LOS: 0 days

## 2021-06-23 NOTE — Progress Notes (Signed)
New admission to 6N 9 due to continuation of care. Patient is alert and oriented x4. VSS, RA. C-Collar on

## 2021-06-23 NOTE — ED Notes (Signed)
Patient denies pain and is resting comfortably.  

## 2021-06-23 NOTE — Progress Notes (Signed)
   06/23/21 1556  Vitals  Temp 98.5 F (36.9 C)  Temp Source Oral  BP 132/65  MAP (mmHg) 84  BP Location Left Arm  BP Method Automatic  Patient Position (if appropriate) Lying  Pulse Rate 80  Pulse Rate Source Dinamap  Resp 16  Level of Consciousness  Level of Consciousness Alert  MEWS COLOR  MEWS Score Color Green  Oxygen Therapy  SpO2 94 %  O2 Device Room Air  Pain Assessment  Pain Scale 0-10  Pain Score 6  Pain Type Acute pain  Pain Location Neck  Pain Orientation Mid  Pain Intervention(s) RN made aware  MEWS Score  MEWS Temp 0  MEWS Systolic 0  MEWS Pulse 0  MEWS RR 0  MEWS LOC 0  MEWS Score 0

## 2021-06-23 NOTE — ED Notes (Signed)
Pt given a glasses of water and assisted to using the urinal. Pain is 8/10, PRN pain medication given

## 2021-06-24 DIAGNOSIS — S32029A Unspecified fracture of second lumbar vertebra, initial encounter for closed fracture: Secondary | ICD-10-CM | POA: Diagnosis not present

## 2021-06-24 DIAGNOSIS — S32039A Unspecified fracture of third lumbar vertebra, initial encounter for closed fracture: Secondary | ICD-10-CM | POA: Diagnosis not present

## 2021-06-24 MED ORDER — ADULT MULTIVITAMIN W/MINERALS CH
1.0000 | ORAL_TABLET | Freq: Every day | ORAL | Status: DC
  Administered 2021-06-25 – 2021-06-28 (×4): 1 via ORAL
  Filled 2021-06-24 (×4): qty 1

## 2021-06-24 NOTE — Plan of Care (Signed)
  Problem: Education: Goal: Knowledge of General Education information will improve Description: Including pain rating scale, medication(s)/side effects and non-pharmacologic comfort measures Outcome: Progressing   Problem: Activity: Goal: Risk for activity intolerance will decrease Outcome: Progressing   Problem: Coping: Goal: Level of anxiety will decrease Outcome: Progressing   Problem: Elimination: Goal: Will not experience complications related to bowel motility Outcome: Progressing   

## 2021-06-24 NOTE — TOC Initial Note (Signed)
Transition of Care (TOC) - Initial/Assessment Note    Patient Details  Name: Cody Hines. MRN: [id number] Date of Birth: 02/11/1946  [facility]) CM/SW Contact:    Emeterio Reeve, LCSW Phone Number: 06/24/2021, 2:29 PM  Clinical Narrative:                  CSW received SNF consult. CSW met with pt at bedside. CSW introduced self and explained role at the hospital. Pt reports that PTA pt lived at home alone. Pt was independent with mobility and ADL's.   CSW reviewed PT/OT recommendations for SNF. Pt reports he is fine with SNF.  Pt gave CSW permission to fax out to facilities in the area. Pt prefers [facility]. CSW gave pt medicare.gov rating list to review. CSW explained insurance auth process. Pt reports they are covid vaccinated with 1 booster.   CSW will continue to follow.   Expected Discharge Plan: Skilled Nursing Facility Barriers to Discharge: Continued Medical Work up   Patient Goals and CMS Choice Patient states their goals for this hospitalization and ongoing recovery are:: to get better CMS Medicare.gov Compare Post Acute Care list provided to:: Patient Choice offered to / list presented to : Patient  Expected Discharge Plan and Services Expected Discharge Plan: [place]       Living arrangements for the past 2 months: Single Family Home                                      Prior Living Arrangements/Services Living arrangements for the past 2 months: Single Family Home Lives with:: Self Patient language and need for interpreter reviewed:: Yes Do you feel safe going back to the place where you live?: Yes      Need for Family Participation in Patient Care: Yes (Comment) Care giver support system in place?: Yes (comment)   Criminal Activity/Legal Involvement Pertinent to Current Situation/Hospitalization: No - Comment as needed  Activities of Daily Living Home Assistive Devices/Equipment: Gilford Rile (specify  type) (front wheel but do not use) ADL Screening (condition at time of admission) Patient's cognitive ability adequate to safely complete daily activities?: Yes Is the patient deaf or have difficulty hearing?: Yes Does the patient have difficulty seeing, even when wearing glasses/contacts?: No Does the patient have difficulty concentrating, remembering, or making decisions?: No Patient able to express need for assistance with ADLs?: No Does the patient have difficulty dressing or bathing?: No Independently performs ADLs?: Yes (appropriate for developmental age) Does the patient have difficulty walking or climbing stairs?: No Weakness of Legs: None Weakness of Arms/Hands: None  Permission Sought/Granted Permission sought to share information with : Chartered certified accountant granted to share information with : Yes, Verbal Permission Granted     Permission granted to share info w AGENCY: SNF        Emotional Assessment Appearance:: Appears stated age Attitude/Demeanor/Rapport: Engaged Affect (typically observed): Appropriate Orientation: : Oriented to Self, Oriented to Place, Oriented to  Time, Oriented to Situation Alcohol / Substance Use: Not Applicable Psych Involvement: No (comment)  Admission diagnosis:  Crush accident [X58.XXXA] Closed L3 vertebral fracture ([place]) [S32.039A] Chest pain [R07.9] Contact with powered lawnmower as cause of accidental injury, initial encounter [W28.XXXA] Fall [W19.XXXA] Patient Active Problem List   Diagnosis Date Noted   Fall 06/23/2021   HTN (hypertension) 06/22/2021   Closed L2 vertebral fracture ([place]) 06/22/2021  Closed L3 vertebral fracture ([place]) 06/22/2021   Contact with powered lawnmower as cause of accidental injury at home as place of occurrence 06/22/2021   CKD (chronic kidney disease) stage 3, GFR 30-59 ml/min (HCC) 06/22/2021   OSA on CPAP 06/22/2021   MCI (mild cognitive impairment) 06/22/2021   Pulmonary nodule  06/22/2021   PCP:  [name], MD Pharmacy:   [facility], [address] [place] [address] Phone: [phone] Fax: [phone]     Social Determinants of Health (SDOH) Interventions    Readmission Risk Interventions No flowsheet data found.  Emeterio Reeve, LCSW Clinical Social Worker

## 2021-06-24 NOTE — Progress Notes (Signed)
Physical Therapy Treatment Patient Details Name: Cody Hines. MRN: [id number] DOB: 04-24-1946 Today's Date: 06/24/2021   History of Present Illness 75 y.o. male presented to the ED 06/22/21 after his lawn mower flipped and trapped him under it for about 15 mins. L3 Fx with retropulsion and mild L2 compression fx--both stable per neurosurgery.  PMH  significant of HTN, OSA on CPAP, MCI, prior T12 compression fx s/p vertebroplasty Dec 2020, ?early PD    PT Comments    Pt was seen for mobility to get up to walk a few steps and to sit up in his chair.  Has a hard collar with no findings, and discussed with MD and nursing to conclude it could be removed.  Follow along with him to progress gait and transfers with cues for safety with balance, to control his posture and reinforce safety of back precautions.  Follow for acute PT goals.    Recommendations for follow up therapy are one component of a multi-disciplinary discharge planning process, led by the attending physician.  Recommendations may be updated based on patient status, additional functional criteria and insurance authorization.  Follow Up Recommendations  SNF     Equipment Recommendations  Rolling walker with 5" wheels    Recommendations for Other Services       Precautions / Restrictions Precautions Precautions: Back;Fall Precaution Booklet Issued: No Precaution Comments: reviewed back precautions with pt and son Required Braces or Orthoses: Spinal Brace Cervical Brace: Other (comment) (discontinued today) Spinal Brace: Thoracolumbosacral orthotic;Applied in sitting position Restrictions Weight Bearing Restrictions: No     Mobility  Bed Mobility Overal bed mobility: Needs Assistance Bed Mobility: Rolling;Sidelying to Sit Rolling: Mod assist Sidelying to sit: Mod assist       General bed mobility comments: mod to support trunk to sit up    Transfers Overall transfer level: Needs  assistance Equipment used: Rolling walker (2 wheeled) Transfers: Sit to/from Stand Sit to Stand: Mod assist         General transfer comment: mod to stand and mod to support initially  Ambulation/Gait Ambulation/Gait assistance: Min assist;+2 physical assistance;+2 safety/equipment Gait Distance (Feet): 4 Feet Assistive device: Rolling walker (2 wheeled) Gait Pattern/deviations: Step-to pattern;Shuffle;Wide base of support Gait velocity: reduced Gait velocity interpretation: <1.8 ft/sec, indicate of risk for recurrent falls General Gait Details: Pt took steps forward on floor with min assist to control balance and mod to support to sit   Stairs             Wheelchair Mobility    Modified Rankin (Stroke Patients Only)       Balance Overall balance assessment: Needs assistance Sitting-balance support: Feet supported Sitting balance-Leahy Scale: Fair     Standing balance support: Bilateral upper extremity supported;During functional activity Standing balance-Leahy Scale: Poor                              Cognition Arousal/Alertness: Awake/alert Behavior During Therapy: Flat affect Overall Cognitive Status: Impaired/Different from baseline Area of Impairment: Problem solving;Awareness;Safety/judgement;Following commands;Memory;Attention;Orientation                 Orientation Level: Situation Current Attention Level: Selective Memory: Decreased recall of precautions;Decreased short-term memory Following Commands: Follows one step commands inconsistently;Follows one step commands with increased time Safety/Judgement: Decreased awareness of safety Awareness: Intellectual Problem Solving: Slow processing;Requires verbal cues;Requires tactile cues        Exercises      General  Comments General comments (skin integrity, edema, etc.): pt is up to stand to maneuver and walk, with close guard chair due to his instability in standing.  Checked on  his collar, and does not need it per chart      Pertinent Vitals/Pain Pain Assessment: Faces Faces Pain Scale: Hurts little more Pain Location: back Pain Descriptors / Indicators: Guarding;Grimacing Pain Intervention(s): Repositioned;Limited activity within patient's tolerance;Monitored during session    Home Living                      Prior Function            PT Goals (current goals can now be found in the care plan section) Acute Rehab PT Goals Patient Stated Goal: to have less pain Potential to Achieve Goals: Good Progress towards PT goals: Progressing toward goals    Frequency    Min 3X/week      PT Plan Current plan remains appropriate    Co-evaluation              AM-PAC PT "6 Clicks" Mobility   Outcome Measure  Help needed turning from your back to your side while in a flat bed without using bedrails?: A Lot Help needed moving from lying on your back to sitting on the side of a flat bed without using bedrails?: A Lot Help needed moving to and from a bed to a chair (including a wheelchair)?: A Lot Help needed standing up from a chair using your arms (e.g., wheelchair or bedside chair)?: A Lot Help needed to walk in hospital room?: A Lot Help needed climbing 3-5 steps with a railing? : A Lot 6 Click Score: 12    End of Session Equipment Utilized During Treatment: Gait belt;Back brace;Oxygen Activity Tolerance: No increased pain;Treatment limited secondary to medical complications (Comment) Patient left: with call bell/phone within reach;in chair;with family/visitor present;with chair alarm set Nurse Communication: Mobility status PT Visit Diagnosis: Repeated falls (R29.6);Muscle weakness (generalized) (M62.81);Difficulty in walking, not elsewhere classified (R26.2);Dizziness and giddiness (R42)     Time: 5397-6734 PT Time Calculation (min) (ACUTE ONLY): 50 min  Charges:  $Gait Training: 8-22 mins $Therapeutic Activity: 8-22  mins $Neuromuscular Re-education: 8-22 mins             Ramond Dial 06/24/2021, 1:39 PM  Mee Hives, PT MS Acute Rehab Dept. Number: [place]

## 2021-06-24 NOTE — Progress Notes (Signed)
Placed pt on CPAP via nasal mask, auto titrate settings 2L O2 bleed in.

## 2021-06-24 NOTE — Progress Notes (Addendum)
Initial Nutrition Assessment  DOCUMENTATION CODES:   Non-severe (moderate) malnutrition in context of chronic illness  INTERVENTION:  - Recommend liberalizing pt diet to increase PO intake         - Encourage good PO intake   - Continue Ensure Enlive po BID, each supplement provides 350 kcal and 20 grams of protein  - Magic Cup BID with meals, each supplement provides 290 kcal and 9 grams of protein  - MVI w/ mineral daily   NUTRITION DIAGNOSIS:   Moderate Malnutrition related to chronic illness (Back Pain) as evidenced by mild muscle depletion, energy intake < 75% for > or equal to 1 month, moderate fat depletion.   GOAL:   Patient will meet greater than or equal to 90% of their needs   MONITOR:   PO intake, Supplement acceptance  REASON FOR ASSESSMENT:   Malnutrition Screening Tool    ASSESSMENT:   75 y.o. male presented to the ED after lawn mower accident. PMH of HTN, GERD, IBS, Vitamin D deficiency, and CKD III. Pt admitted with L2 and L3 fracture.   Pt reports that his appetite has not be great, but was good prior to admission. Pt reports that he typically only eats 1 meal/day and it varies; can be waffles, soup and sandwich, or a frozen dinner. Pt reports that this has been going on for about 2 years now. Son reports that the pt is able to cook and make food he just usually does not. Son brought pt chicken and biscuit this morning, but did not eat it.   Pt reports that his UBW was 238# around 4 years ago and the had a back injury that dropped him down to 185#. Reports that he started to gain weight back when he was living with his wife, but could not report how much he had gained back. Reports that he will occasionally use a cane when out of the house if he knows that he will be walking a lot. Son reports that the pt will not do much the next day if he mowes the lawn due to back pain. Per EMR marked for merge, pt has had 6.2% weight loss in 3 months, not clinically  significant.   Discussed the ONS with pt; pt agreeable to keep the Ensures and also add in Magic Cup BID.   Medications reviewed. Labs reviewed.   NUTRITION - FOCUSED PHYSICAL EXAM:  Flowsheet Row Most Recent Value  Orbital Region Moderate depletion  Upper Arm Region Mild depletion  Thoracic and Lumbar Region No depletion  Buccal Region Moderate depletion  Temple Region Mild depletion  Clavicle Bone Region Mild depletion  Clavicle and Acromion Bone Region Mild depletion  Scapular Bone Region Mild depletion  Dorsal Hand Mild depletion  Patellar Region Mild depletion  Anterior Thigh Region Mild depletion  Posterior Calf Region Mild depletion  Edema (RD Assessment) None  Hair Reviewed  Eyes Reviewed  Mouth Reviewed  Skin Reviewed  Nails Reviewed       Diet Order:   Diet Order             Diet regular Room service appropriate? Yes; Fluid consistency: Thin  Diet effective now                   EDUCATION NEEDS:   No education needs have been identified at this time  Skin:  Skin Assessment: Reviewed RN Assessment  Last BM:  Prior to Admission  Height:   Ht Readings from Last  1 Encounters:  06/22/21 '6\' 1"'$  (1.854 m)    Weight:   Wt Readings from Last 1 Encounters:  06/22/21 88.5 kg    Ideal Body Weight:  83.6 kg  BMI:  Body mass index is 25.73 kg/m.  Estimated Nutritional Needs:   Kcal:  2200-2400  Protein:  110-125 grams  Fluid:  >/= 2.2 L   Romy Ipock BS, PLDN Clinical Dietitian See [facility] for contact information.

## 2021-06-24 NOTE — Progress Notes (Addendum)
[facility]  PROGRESS NOTE  Cody Hines. [id number] DOB: November 30, 1945 DOA: 06/22/2021 PCP: [name], MD   Brief HPI:   75 year old male with history of hypertension, OSA on CPAP, prior T12 compression fracture status post vertebroplasty by Dr. Arnoldo Morale in December 2020 presented to ED after patient had trauma at home.  Patient lawnmower flipped and trapped him under it for about 15 minutes.  Patient developed severe low back pain but no neurological deficit. In the ED, CT of the lumbar spine showed L3 vertebral body fracture with mild retropulsion of fracture fragments along the superior endplate.  Neurosurgery was consulted and recommended TLSO brace.  Nonoperative management.    Subjective   Patient seen and examined, denies any complaints.  Pain well controlled.   Assessment/Plan:     L3 fracture with retropulsion/mild L2 compression fracture -Caused by a lawnmower rollover -Neurosurgery Dr. Kathyrn Sheriff recommended TLSO brace -PT/OT consulted; patient will need skilled nursing facility -Continue pain management with Dilaudid as needed  Pulmonary nodule -Seen on CT thoracic spine -Will need repeat CT in 6 to 12 months  Hypertension -Continue enalapril  CKD stage III -Creatinine 1.55 -  ? at baseline -Follow BMP in am     Data Reviewed:   CBG:  No results for input(s): GLUCAP in the last 168 hours.  SpO2: 96 % O2 Flow Rate (L/min): 6 L/min    Vitals:   06/23/21 2241 06/24/21 0245 06/24/21 0754 06/24/21 1614  BP:  139/66 136/67 (!) 110/49  Pulse: 92 79 75 73  Resp:  '18 18 16  '$ Temp:  (!) 97.4 F (36.3 C) 98 F (36.7 C) 98.1 F (36.7 C)  TempSrc:  Oral Oral Oral  SpO2: 90% 96% 98% 96%  Weight:      Height:         Intake/Output Summary (Last 24 hours) at 06/24/2021 1846 Last data filed at 06/24/2021 0900 Gross per 24 hour  Intake 120 ml  Output 1450 ml  Net -1330 ml    09/17 1901 - 09/19 0700 In: -  Out: 1550  [id number]  Filed Weights   06/22/21 1523  Weight: 88.5 kg    Data Reviewed: Basic Metabolic Panel: Recent Labs  Lab 06/22/21 1519 06/22/21 1524 06/23/21 0410  NA 140 141 139  K 3.7 3.7 4.1  CL 107 106 106  CO2 24  --  25  GLUCOSE 189* 183* 126*  BUN '11 12 12  '$ CREATININE 1.69* 1.60* 1.55*  CALCIUM 9.6  --  9.3   Liver Function Tests: Recent Labs  Lab 06/22/21 1519  AST 46*  ALT 26  ALKPHOS 79  BILITOT 1.7*  PROT 6.4*  ALBUMIN 3.6   No results for input(s): LIPASE, AMYLASE in the last 168 hours. No results for input(s): AMMONIA in the last 168 hours. CBC: Recent Labs  Lab 06/22/21 1519 06/22/21 1524 06/23/21 0410  WBC 6.0  --  7.2  HGB 13.6 13.3 13.9  HCT 39.2 39.0 40.7  MCV 90.3  --  91.9  PLT 114*  --  100*   Cardiac Enzymes: No results for input(s): CKTOTAL, CKMB, CKMBINDEX, TROPONINI in the last 168 hours. BNP (last 3 results) No results for input(s): BNP in the last 8760 hours.  ProBNP (last 3 results) No results for input(s): PROBNP in the last 8760 hours.  CBG: No results for input(s): GLUCAP in the last 168 hours.  Recent Results (from the past 240 hour(s))  Resp Panel by RT-PCR (Flu A&B, Covid)  Nasopharyngeal Swab     Status: None   Collection Time: 06/22/21  3:56 PM   Specimen: Nasopharyngeal Swab; Nasopharyngeal(NP) swabs in vial transport medium  Result Value Ref Range Status   SARS Coronavirus 2 by RT PCR NEGATIVE NEGATIVE Final    Comment: (NOTE) SARS-CoV-2 target nucleic acids are NOT DETECTED.  The SARS-CoV-2 RNA is generally detectable in upper respiratory specimens during the acute phase of infection. The lowest concentration of SARS-CoV-2 viral copies this assay can detect is 138 copies/mL. A negative result does not preclude SARS-Cov-2 infection and should not be used as the sole basis for treatment or other patient management decisions. A negative result may occur with  improper specimen collection/handling, submission of  specimen other than nasopharyngeal swab, presence of viral mutation(s) within the areas targeted by this assay, and inadequate number of viral copies(<138 copies/mL). A negative result must be combined with clinical observations, patient history, and epidemiological information. The expected result is Negative.  Fact Sheet for Patients:  EntrepreneurPulse.com.au  Fact Sheet for Healthcare Providers:  IncredibleEmployment.be  This test is no t yet approved or cleared by the [place] FDA and  has been authorized for detection and/or diagnosis of SARS-CoV-2 by FDA under an Emergency Use Authorization (EUA). This EUA will remain  in effect (meaning this test can be used) for the duration of the COVID-19 declaration under Section 564(b)(1) of the Act, 21 U.S.C.section 360bbb-3(b)(1), unless the authorization is terminated  or revoked sooner.       Influenza A by PCR NEGATIVE NEGATIVE Final   Influenza B by PCR NEGATIVE NEGATIVE Final    Comment: (NOTE) The Xpert Xpress SARS-CoV-2/FLU/RSV plus assay is intended as an aid in the diagnosis of influenza from Nasopharyngeal swab specimens and should not be used as a sole basis for treatment. Nasal washings and aspirates are unacceptable for Xpert Xpress SARS-CoV-2/FLU/RSV testing.  Fact Sheet for Patients: EntrepreneurPulse.com.au  Fact Sheet for Healthcare Providers: IncredibleEmployment.be  This test is not yet approved or cleared by the [place] FDA and has been authorized for detection and/or diagnosis of SARS-CoV-2 by FDA under an Emergency Use Authorization (EUA). This EUA will remain in effect (meaning this test can be used) for the duration of the COVID-19 declaration under Section 564(b)(1) of the Act, 21 U.S.C. section 360bbb-3(b)(1), unless the authorization is terminated or revoked.  Performed at [facility], [place] [address],  [place], [place] [address]         Scheduled medications:    enalapril  20 mg Oral Daily   feeding supplement  237 mL Oral BID BM   multivitamin with minerals  1 tablet Oral Daily   rosuvastatin  40 mg Oral Daily   sertraline  100 mg Oral BID    Antibiotics: Anti-infectives (From admission, onward)    None        Radiology Reports  No results found.    DVT prophylaxis: SCDs  Code Status: Full code  Family Communication: Discussed with son at bedside   Consultants:   Procedures:     Objective    Physical Examination:   General-appears in no acute distress Heart-S1-S2, regular, no murmur auscultated Lungs-clear to auscultation bilaterally, no wheezing or crackles auscultated Abdomen-soft, nontender, no organomegaly Extremities-no edema in the lower extremities Neuro-alert, oriented x3, no focal deficit noted  Status is: Inpatient  Dispo: The patient is from: Home              Anticipated d/c is to: Skilled  nursing facility              Anticipated d/c date is: 06/26/2021              Patient currently not stable for discharge  Barrier to discharge-we will need skilled nursing facility for rehab  COVID-19 Labs  No results for input(s): DDIMER, FERRITIN, LDH, CRP in the last 72 hours.  Lab Results  Component Value Date   [place] NEGATIVE 06/22/2021         Oswald Hillock   [facility] If 7PM-7AM, please contact night-coverage at [email or web address], Office  [phone]   06/24/2021, 6:46 PM  LOS: 1 day

## 2021-06-24 NOTE — NC FL2 (Signed)
[place] LEVEL OF CARE SCREENING TOOL     IDENTIFICATION  Patient Name: Cody Hines. Birthdate: 25-Oct-1945 Sex: male Admission Date (Current Location): 06/22/2021  [facility] and [place] Number:  Herbalist and Address:  [facility] [place], [place] [address], [place], [place] [address]      Provider Number: [id number]  Attending Physician Name and Address:  Oswald Hillock, MD  Relative Name and Phone Number:       Current Level of Care: Hospital Recommended Level of Care: [place] Prior Approval Number:    Date Approved/Denied:   PASRR Number: [id number]  Discharge Plan: SNF    Current Diagnoses: Patient Active Problem List   Diagnosis Date Noted   Fall 06/23/2021   HTN (hypertension) 06/22/2021   Closed L2 vertebral fracture ([place]) 06/22/2021   Closed L3 vertebral fracture ([place]) 06/22/2021   Contact with powered lawnmower as cause of accidental injury at home as place of occurrence 06/22/2021   CKD (chronic kidney disease) stage 3, GFR 30-59 ml/min ([place]) 06/22/2021   OSA on CPAP 06/22/2021   MCI (mild cognitive impairment) 06/22/2021   Pulmonary nodule 06/22/2021    Orientation RESPIRATION BLADDER Height & Weight     Self, Time, Situation, Place  Normal Continent Weight: 195 lb (88.5 kg) Height:  6\' 1"  (185.4 cm)  BEHAVIORAL SYMPTOMS/MOOD NEUROLOGICAL BOWEL NUTRITION STATUS      Continent Diet  AMBULATORY STATUS COMMUNICATION OF NEEDS Skin   Extensive Assist                           Personal Care Assistance Level of Assistance  Bathing, Feeding, Dressing Bathing Assistance: Maximum assistance Feeding assistance: Limited assistance Dressing Assistance: Maximum assistance     Functional Limitations Info  Sight, Hearing, Speech Sight Info: Adequate Hearing Info: Adequate Speech Info: Adequate    SPECIAL CARE FACTORS FREQUENCY  PT (By licensed PT), OT (By licensed OT)     PT  Frequency: 5x a week OT Frequency: 5x a week            Contractures Contractures Info: Not present    Additional Factors Info  Code Status, Allergies Code Status Info: Full Allergies Info: Aspirin   Atorvastatin   Celebrex (Celecoxib)   Codeine   Savella (Milnacipran)   Viagra (Sildenafil)   Penicillins           Current Medications (06/24/2021):  This is the current hospital active medication list Current Facility-Administered Medications  Medication Dose Route Frequency Provider Last Rate Last Admin   acetaminophen (TYLENOL) tablet 650 mg  650 mg Oral Q6H PRN Etta Quill, DO   650 mg at 06/23/21 0109   Or   acetaminophen (TYLENOL) suppository 650 mg  650 mg Rectal Q6H PRN Etta Quill, DO       enalapril (VASOTEC) tablet 20 mg  20 mg Oral Daily Jennette Kettle M, DO   20 mg at 06/24/21 0954   feeding supplement (ENSURE ENLIVE / ENSURE PLUS) liquid 237 mL  237 mL Oral BID BM Oswald Hillock, MD   237 mL at 06/24/21 0955   HYDROmorphone (DILAUDID) injection 0.5-1 mg  0.5-1 mg Intravenous Q2H PRN Etta Quill, DO   1 mg at 06/24/21 1155   ondansetron (ZOFRAN) tablet 4 mg  4 mg Oral Q6H PRN Etta Quill, DO       Or   ondansetron [facility])  injection 4 mg  4 mg Intravenous Q6H PRN Etta Quill, DO       rosuvastatin (CRESTOR) tablet 40 mg  40 mg Oral Daily Jennette Kettle M, DO   40 mg at 06/24/21 0954   sertraline (ZOLOFT) tablet 100 mg  100 mg Oral BID Etta Quill, DO   100 mg at 06/24/21 8184     Discharge Medications: Please see discharge summary for a list of discharge medications.  Relevant Imaging Results:  Relevant Lab Results:   Additional Information SSN: [id number], covid vax, 1 booster  Emeterio Reeve, [place]

## 2021-06-25 DIAGNOSIS — S32039A Unspecified fracture of third lumbar vertebra, initial encounter for closed fracture: Secondary | ICD-10-CM | POA: Diagnosis not present

## 2021-06-25 DIAGNOSIS — G4733 Obstructive sleep apnea (adult) (pediatric): Secondary | ICD-10-CM | POA: Diagnosis not present

## 2021-06-25 DIAGNOSIS — R911 Solitary pulmonary nodule: Secondary | ICD-10-CM

## 2021-06-25 DIAGNOSIS — S32029A Unspecified fracture of second lumbar vertebra, initial encounter for closed fracture: Secondary | ICD-10-CM | POA: Diagnosis not present

## 2021-06-25 DIAGNOSIS — N183 Chronic kidney disease, stage 3 unspecified: Secondary | ICD-10-CM | POA: Diagnosis not present

## 2021-06-25 LAB — BASIC METABOLIC PANEL
Anion gap: 9 (ref 5–15)
BUN: 18 mg/dL (ref 8–23)
CO2: 27 mmol/L (ref 22–32)
Calcium: 9.4 mg/dL (ref 8.9–10.3)
Chloride: 100 mmol/L (ref 98–111)
Creatinine, Ser: 1.44 mg/dL — ABNORMAL HIGH (ref 0.61–1.24)
GFR, Estimated: 52 mL/min — ABNORMAL LOW (ref >60)
Glucose, Bld: 110 mg/dL — ABNORMAL HIGH (ref 70–99)
Potassium: 3.6 mmol/L (ref 3.5–5.1)
Sodium: 136 mmol/L (ref 135–145)

## 2021-06-25 MED ORDER — METHOCARBAMOL 500 MG PO TABS
500.0000 mg | ORAL_TABLET | Freq: Four times a day (QID) | ORAL | Status: DC | PRN
  Administered 2021-06-26 – 2021-06-27 (×3): 500 mg via ORAL
  Filled 2021-06-25 (×3): qty 1

## 2021-06-25 NOTE — TOC Progression Note (Signed)
Transition of Care (TOC) - Progression Note    Patient Details  Name: Cody Hines. MRN: [id number] Date of Birth: 1945-12-14  Transition of Care [facility]) CM/SW Contact  Emeterio Reeve, [place] Phone Number: 06/25/2021, 3:32 PM  Clinical Narrative:     CSW gave bed offers to pt at bedside. Pt asked CSW to follow up with son.   CSW spoke to pts son via phone. Alroy Dust states he would like for pt to go to a SNF in [place]. He asked for referrals to be sent out to [facility] in [place], F: [phone]) , and [facility] in [place] [phone], F [phone]) and [facility] does not take pts insurance.   TOC will follow.   Expected Discharge Plan: [place] Barriers to Discharge: Continued Medical Work up  Expected Discharge Plan and Services Expected Discharge Plan: [place] arrangements for the past 2 months: Single Family Home                                       Social Determinants of Health (SDOH) Interventions    Readmission Risk Interventions No flowsheet data found.  Emeterio Reeve, LCSW Clinical Social Worker

## 2021-06-25 NOTE — Progress Notes (Signed)
[facility]  PROGRESS NOTE  Cody Hines. JQB:[id number] DOB: 1946-03-10 DOA: 06/22/2021 PCP: [name], MD   Brief HPI:   75 year old male with history of hypertension, OSA on CPAP, prior T12 compression fracture status post vertebroplasty by Dr. Arnoldo Morale in December 2020 presented to ED after patient had trauma at home.  Patient lawnmower flipped and trapped him under it for about 15 minutes.  Patient developed severe low back pain but no neurological deficit. In the ED, CT of the lumbar spine showed L3 vertebral body fracture with mild retropulsion of fracture fragments along the superior endplate.  Neurosurgery was consulted and recommended TLSO brace.  Nonoperative management.    Subjective   Patient seen and examined, complains of back muscle spasm.   Assessment/Plan:     L3 fracture with retropulsion/mild L2 compression fracture -Caused by a lawnmower rollover -Neurosurgery Dr. Kathyrn Sheriff recommended TLSO brace -PT/OT consulted; patient will need skilled nursing facility -Continue pain management with Dilaudid as needed -Start Robaxin 500 mg p.o. every 6 hours as needed for muscle spasm  Pulmonary nodule -Seen on CT thoracic spine -Will need repeat CT chest in 6 to 12 months  Hypertension -Continue enalapril  CKD stage III -Creatinine 1.44 -At baseline     Data Reviewed:   CBG:  No results for input(s): GLUCAP in the last 168 hours.  SpO2: 98 % O2 Flow Rate (L/min): 3 L/min    Vitals:   06/24/21 1614 06/24/21 1931 06/25/21 0412 06/25/21 0729  BP: (!) 110/49 (!) 144/69 116/61 (!) 141/74  Pulse: 73 88 70 74  Resp: 16 18 19 18   Temp: 98.1 F (36.7 C) 98.6 F (37 C) 98.7 F (37.1 C) 98.3 F (36.8 C)  TempSrc: Oral Oral Oral Oral  SpO2: 96% 93% 96% 98%  Weight:      Height:         Intake/Output Summary (Last 24 hours) at 06/25/2021 1715 Last data filed at 06/25/2021 4097 Gross per 24 hour  Intake 240 ml  Output 725 ml   Net -485 ml    09/18 1901 - 09/20 0700 In: 360 [P.O.:360] Out: 1975 [[id number]]  Filed Weights   06/22/21 1523  Weight: 88.5 kg    Data Reviewed: Basic Metabolic Panel: Recent Labs  Lab 06/22/21 1519 06/22/21 1524 06/23/21 0410 06/25/21 0341  NA 140 141 139 136  K 3.7 3.7 4.1 3.6  CL 107 106 106 100  CO2 24  --  25 27  GLUCOSE 189* 183* 126* 110*  BUN 11 12 12 18   CREATININE 1.69* 1.60* 1.55* 1.44*  CALCIUM 9.6  --  9.3 9.4   Liver Function Tests: Recent Labs  Lab 06/22/21 1519  AST 46*  ALT 26  ALKPHOS 79  BILITOT 1.7*  PROT 6.4*  ALBUMIN 3.6   No results for input(s): LIPASE, AMYLASE in the last 168 hours. No results for input(s): AMMONIA in the last 168 hours. CBC: Recent Labs  Lab 06/22/21 1519 06/22/21 1524 06/23/21 0410  WBC 6.0  --  7.2  HGB 13.6 13.3 13.9  HCT 39.2 39.0 40.7  MCV 90.3  --  91.9  PLT 114*  --  100*   Cardiac Enzymes: No results for input(s): CKTOTAL, CKMB, CKMBINDEX, TROPONINI in the last 168 hours. BNP (last 3 results) No results for input(s): BNP in the last 8760 hours.  ProBNP (last 3 results) No results for input(s): PROBNP in the last 8760 hours.  CBG: No results for input(s): GLUCAP in the  last 168 hours.  Recent Results (from the past 240 hour(s))  Resp Panel by RT-PCR (Flu A&B, Covid) Nasopharyngeal Swab     Status: None   Collection Time: 06/22/21  3:56 PM   Specimen: Nasopharyngeal Swab; Nasopharyngeal(NP) swabs in vial transport medium  Result Value Ref Range Status   SARS Coronavirus 2 by RT PCR NEGATIVE NEGATIVE Final    Comment: (NOTE) SARS-CoV-2 target nucleic acids are NOT DETECTED.  The SARS-CoV-2 RNA is generally detectable in upper respiratory specimens during the acute phase of infection. The lowest concentration of SARS-CoV-2 viral copies this assay can detect is 138 copies/mL. A negative result does not preclude SARS-Cov-2 infection and should not be used as the sole basis for treatment  or other patient management decisions. A negative result may occur with  improper specimen collection/handling, submission of specimen other than nasopharyngeal swab, presence of viral mutation(s) within the areas targeted by this assay, and inadequate number of viral copies(<138 copies/mL). A negative result must be combined with clinical observations, patient history, and epidemiological information. The expected result is Negative.  Fact Sheet for Patients:  EntrepreneurPulse.com.au  Fact Sheet for Healthcare Providers:  IncredibleEmployment.be  This test is no t yet approved or cleared by the [place] FDA and  has been authorized for detection and/or diagnosis of SARS-CoV-2 by FDA under an Emergency Use Authorization (EUA). This EUA will remain  in effect (meaning this test can be used) for the duration of the COVID-19 declaration under Section 564(b)(1) of the Act, 21 U.S.C.section 360bbb-3(b)(1), unless the authorization is terminated  or revoked sooner.       Influenza A by PCR NEGATIVE NEGATIVE Final   Influenza B by PCR NEGATIVE NEGATIVE Final    Comment: (NOTE) The Xpert Xpress SARS-CoV-2/FLU/RSV plus assay is intended as an aid in the diagnosis of influenza from Nasopharyngeal swab specimens and should not be used as a sole basis for treatment. Nasal washings and aspirates are unacceptable for Xpert Xpress SARS-CoV-2/FLU/RSV testing.  Fact Sheet for Patients: EntrepreneurPulse.com.au  Fact Sheet for Healthcare Providers: IncredibleEmployment.be  This test is not yet approved or cleared by the [place] FDA and has been authorized for detection and/or diagnosis of SARS-CoV-2 by FDA under an Emergency Use Authorization (EUA). This EUA will remain in effect (meaning this test can be used) for the duration of the COVID-19 declaration under Section 564(b)(1) of the Act, 21 U.S.C. section  360bbb-3(b)(1), unless the authorization is terminated or revoked.  Performed at [facility], [place] [address] [place], [place] [address]         Scheduled medications:    enalapril  20 mg Oral Daily   feeding supplement  237 mL Oral BID BM   multivitamin with minerals  1 tablet Oral Daily   rosuvastatin  40 mg Oral Daily   sertraline  100 mg Oral BID    Antibiotics: Anti-infectives (From admission, onward)    None        Radiology Reports  No results found.    DVT prophylaxis: SCDs  Code Status: Full code  Family Communication: Discussed with son at bedside   Consultants:   Procedures:     Objective    Physical Examination:   General-appears in no acute distress Heart-S1-S2, regular, no murmur auscultated Lungs-clear to auscultation bilaterally, no wheezing or crackles auscultated Abdomen-soft, nontender, no organomegaly Extremities-no edema in the lower extremities Neuro-alert, oriented x3, no focal deficit noted  Status is: Inpatient  Dispo: The patient is from:  Home              Anticipated d/c is to: Skilled nursing facility              Anticipated d/c date is: 06/26/2021              Patient currently not stable for discharge  Barrier to discharge-we will need skilled nursing facility for rehab  COVID-19 Labs  No results for input(s): DDIMER, FERRITIN, LDH, CRP in the last 72 hours.  Lab Results  Component Value Date   Person NEGATIVE 06/22/2021         Oswald Hillock   [facility] If 7PM-7AM, please contact night-coverage at [email or web address], Office  [phone]   06/25/2021, 5:15 PM  LOS: 2 days

## 2021-06-26 DIAGNOSIS — E440 Moderate protein-calorie malnutrition: Secondary | ICD-10-CM | POA: Insufficient documentation

## 2021-06-26 DIAGNOSIS — S32039A Unspecified fracture of third lumbar vertebra, initial encounter for closed fracture: Secondary | ICD-10-CM | POA: Diagnosis not present

## 2021-06-26 DIAGNOSIS — N183 Chronic kidney disease, stage 3 unspecified: Secondary | ICD-10-CM | POA: Diagnosis not present

## 2021-06-26 DIAGNOSIS — W28XXXA Contact with powered lawn mower, initial encounter: Secondary | ICD-10-CM | POA: Diagnosis not present

## 2021-06-26 DIAGNOSIS — S32029A Unspecified fracture of second lumbar vertebra, initial encounter for closed fracture: Secondary | ICD-10-CM | POA: Diagnosis not present

## 2021-06-26 DIAGNOSIS — W19XXXD Unspecified fall, subsequent encounter: Secondary | ICD-10-CM

## 2021-06-26 LAB — URINALYSIS, MICROSCOPIC (REFLEX)

## 2021-06-26 LAB — URINALYSIS, ROUTINE W REFLEX MICROSCOPIC
Bilirubin Urine: NEGATIVE
Glucose, UA: NEGATIVE mg/dL
Ketones, ur: NEGATIVE mg/dL
Nitrite: NEGATIVE
Protein, ur: NEGATIVE mg/dL
Specific Gravity, Urine: 1.01 (ref 1.005–1.030)
pH: 6 (ref 5.0–8.0)

## 2021-06-26 LAB — SARS CORONAVIRUS 2 (TAT 6-24 HRS): SARS Coronavirus 2: NEGATIVE

## 2021-06-26 MED ORDER — TRAMADOL HCL 50 MG PO TABS
50.0000 mg | ORAL_TABLET | Freq: Four times a day (QID) | ORAL | Status: DC | PRN
Start: 2021-06-26 — End: 2021-06-28
  Administered 2021-06-26 – 2021-06-28 (×3): 50 mg via ORAL
  Filled 2021-06-26 (×3): qty 1

## 2021-06-26 NOTE — Care Management Important Message (Signed)
Important Message  Patient Details  Name: Cody Hines. MRN: [id number] Date of Birth: [birth year]   Medicare Important Message Given:  Yes     Orbie Pyo 06/26/2021, 12:27 PM

## 2021-06-26 NOTE — TOC CAGE-AID Note (Signed)
Transition of Care (TOC) - CAGE-AID Screening   Patient Details  Name: Cody Hines. 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

## 2021-06-26 NOTE — Progress Notes (Signed)
Pt has urinated clear yellow urine and UA sent to lab per order. Pt states he feels he is emptying his bladder, usually urinating 200-300 at a time.

## 2021-06-26 NOTE — Progress Notes (Signed)
Physical Therapy Treatment Patient Details Name: Cody Hines. MRN: [id number] DOB: [birth year] Today's Date: 06/26/2021   History of Present Illness 75 y.o. male presented to the ED 06/22/21 after his lawn mower flipped and trapped him under it for about 15 mins. L3 Fx with retropulsion and mild L2 compression fx--both stable per neurosurgery.  PMH  significant of HTN, OSA on CPAP, MCI, prior T12 compression fx s/p vertebroplasty Dec 2020, ?early PD    PT Comments    Pt admitted with above diagnosis. Pt was able to ambulate with RW with min assist of 2 and cues for safety and stability. Pt with poor endurance and fatigues quickly. Also needs mod assist for transition to stand. Son called and spoke with son about transporting pt to SNF as SW told son if therapy agreed, he could drive pt there in [place].  Discussed need for education prior to pt d/c and plan to educate son tomorrow.   Pt currently with functional limitations due to balance and endurance deficits. Pt will benefit from skilled PT to increase their independence and safety with mobility to allow discharge to the venue listed below.      Recommendations for follow up therapy are one component of a multi-disciplinary discharge planning process, led by the attending physician.  Recommendations may be updated based on patient status, additional functional criteria and insurance authorization.  Follow Up Recommendations  SNF     Equipment Recommendations  Rolling walker with 5" wheels    Recommendations for Other Services       Precautions / Restrictions Precautions Precautions: Back;Fall Precaution Booklet Issued: No Precaution Comments: reviewed back precautions with pt and son Required Braces or Orthoses: Spinal Brace Spinal Brace: Thoracolumbosacral orthotic;Applied in sitting position Restrictions Weight Bearing Restrictions: No     Mobility  Bed Mobility Overal bed mobility: Needs Assistance Bed  Mobility: Rolling;Sidelying to Sit Rolling: Min guard (with rail) Sidelying to sit: Min guard (with rail)       General bed mobility comments: ming uard to support trunk to sit up with cues for log roll.    Transfers Overall transfer level: Needs assistance Equipment used: Rolling walker (2 wheeled) Transfers: Sit to/from Stand Sit to Stand: Mod assist;From elevated surface         General transfer comment: mod to stand and mod to support initially  Ambulation/Gait Ambulation/Gait assistance: Min assist;+2 physical assistance;+2 safety/equipment Gait Distance (Feet): 25 Feet Assistive device: Rolling walker (2 wheeled) Gait Pattern/deviations: Step-to pattern;Shuffle;Wide base of support;Trunk flexed Gait velocity: reduced Gait velocity interpretation: <1.8 ft/sec, indicate of risk for recurrent falls General Gait Details: Total assist to don brace at EOB.  Son called while PT in room and was asking why brace was riding up to his chin.  Discussed that straps can go under arms and adjusted the brace as such.  Son to come tomorrow for education regarding brace, mobility and car transfer as son is going to drive him to his SNF. Pt able to ambulate to door and back with RW with cues for sequencing steps and RW.  Pt flexed forward and could not stand tall due to pain in back.  Pt needed min guard assist to control balance and mod to support to sit.   Stairs             Wheelchair Mobility    Modified Rankin (Stroke Patients Only)       Balance Overall balance assessment: Needs assistance Sitting-balance support: Feet supported Sitting balance-Leahy  Scale: Fair     Standing balance support: Bilateral upper extremity supported;During functional activity Standing balance-Leahy Scale: Poor Standing balance comment: requires UE support and RW                            Cognition Arousal/Alertness: Awake/alert Behavior During Therapy: Flat affect Overall  Cognitive Status: Impaired/Different from baseline Area of Impairment: Problem solving;Awareness;Safety/judgement;Following commands;Memory;Attention;Orientation                 Orientation Level: Situation Current Attention Level: Selective Memory: Decreased recall of precautions;Decreased short-term memory Following Commands: Follows one step commands inconsistently;Follows one step commands with increased time Safety/Judgement: Decreased awareness of safety Awareness: Intellectual Problem Solving: Slow processing;Requires verbal cues;Requires tactile cues General Comments: Pt is slow to process info.  He does have a h/o of mild cognitive impairment      Exercises      General Comments General comments (skin integrity, edema, etc.): did not need chair follow today.      Pertinent Vitals/Pain Pain Assessment: Faces Faces Pain Scale: Hurts even more Pain Location: back Pain Descriptors / Indicators: Guarding;Grimacing;Stabbing Pain Intervention(s): Limited activity within patient's tolerance;Monitored during session;Repositioned;Patient requesting pain meds-RN notified    Home Living                      Prior Function            PT Goals (current goals can now be found in the care plan section) Acute Rehab PT Goals Patient Stated Goal: to have less pain Progress towards PT goals: Progressing toward goals    Frequency    Min 3X/week      PT Plan Current plan remains appropriate    Co-evaluation              AM-PAC PT "6 Clicks" Mobility   Outcome Measure  Help needed turning from your back to your side while in a flat bed without using bedrails?: A Little Help needed moving from lying on your back to sitting on the side of a flat bed without using bedrails?: A Little Help needed moving to and from a bed to a chair (including a wheelchair)?: A Lot Help needed standing up from a chair using your arms (e.g., wheelchair or bedside chair)?: A  Lot Help needed to walk in hospital room?: A Lot Help needed climbing 3-5 steps with a railing? : A Lot 6 Click Score: 14    End of Session Equipment Utilized During Treatment: Gait belt;Back brace;Oxygen Activity Tolerance: Patient limited by fatigue;Patient limited by pain Patient left: with call bell/phone within reach;in chair;with chair alarm set Nurse Communication: Mobility status PT Visit Diagnosis: Repeated falls (R29.6);Muscle weakness (generalized) (M62.81);Difficulty in walking, not elsewhere classified (R26.2);Dizziness and giddiness (R42)     Time: 1610-9604 PT Time Calculation (min) (ACUTE ONLY): 33 min  Charges:  $Gait Training: 23-37 mins                     Rhenda [place] M,PT Acute Rehab Services [phone] (pager)    Alvira Philips 06/26/2021, 12:53 PM

## 2021-06-26 NOTE — TOC Progression Note (Signed)
Transition of Care (TOC) - Progression Note    Patient Details  Name: Cody Hines. MRN: [id number] Date of Birth: [birth year]  Transition of Care [facility]) CM/SW Contact  Emeterio Reeve, [place] Phone Number: 06/26/2021, 1:15 PM  Clinical Narrative:     [facility] in [place] offered pt a SNF bed for tomorrow. [facility] is the preferred SNF and that referral is still pending.   Pts son prefers to transport pt by family car. PT will do teaching with pt and son before DC tomorrow. CSW requested covid test for pt.   Expected Discharge Plan: [place] Barriers to Discharge: Continued Medical Work up  Expected Discharge Plan and Services Expected Discharge Plan: [place] arrangements for the past 2 months: Single Family Home                                       Social Determinants of Health (SDOH) Interventions    Readmission Risk Interventions No flowsheet data found.  Emeterio Reeve, LCSW Clinical Social Worker

## 2021-06-26 NOTE — Care Management Important Message (Signed)
Important Message  Patient Details  Name: Cody Hines. MRN: [id number] Date of Birth: March 06, 1946   Medicare Important Message Given:  Yes     Loveah Like [place] [place] 06/26/2021, 12:25 PM

## 2021-06-26 NOTE — Progress Notes (Addendum)
PROGRESS NOTE  Cody Hines. NTZ:[id number] DOB: [birth year] DOA: 06/22/2021 PCP: Cody Pinto, MD  Brief History   75 year old male with history of hypertension, OSA on CPAP, prior T12 compression fracture status post vertebroplasty by Cody Hines in December 2020 presented to ED after patient had trauma at home.  Patient lawnmower flipped and trapped him under it for about 15 minutes.  Patient developed severe low back pain but no neurological deficit. In the ED, CT of the lumbar spine showed L3 vertebral body fracture with mild retropulsion of fracture fragments along the superior endplate.  Cody was consulted and recommended TLSO brace.  Nonoperative management.  Recommendation is for SNF. Plan is for son to take patient by private car tomorrow.  Consultants  None  Procedures  None  Antibiotics   Anti-infectives (From admission, onward)    None      Subjective  The patient is complaining of not being able to sleep, because he awakens every couple of hours to urinate.  Objective   Vitals:  Vitals:   06/26/21 0850 06/26/21 1636  BP: 134/79 (!) 180/165  Pulse: 75 72  Resp: 20 16  Temp: 98.4 F (36.9 C) 98 F (36.7 C)  SpO2: 93% 97%    Exam:  Constitutional:  The patient is awake, alert, and oriented x 3. No acute distress. ERespiratory:  CTA bilaterally, no w/r/r.  Respiratory effort normal. No retractions or accessory muscle use Cardiovascular:  RRR, no m/r/g No LE extremity edema   Normal pedal pulses Abdomen:  Abdomen appears normal; no tenderness or masses No hernias No HSM Musculoskeletal:  Digits/nails BUE: no clubbing, cyanosis, petechiae, infection exam of joints, bones, muscles of at least one of following: head/neck, RUE, LUE, RLE, LLE   strength and tone normal, no atrophy, no abnormal movements Skin:  No rashes, lesions, ulcers palpation of skin: no induration or nodules Neurologic:  CN 2-12 intact Sensation all 4  extremities intact Psychiatric:  Mental status: Awake, alert, and oriented x 3. Mood, affect appropriate Orientation to person, place, time  judgment and insight appear intact     I have personally reviewed the following:   Today's Data  Vitals  Micro Data  COVID -  Imaging  CT C-spine CT Chest, Abdomen, and Pelvis CT head without contrast  Cardiology Data  EKG  Scheduled Meds:  enalapril  20 mg Oral Daily   feeding supplement  237 mL Oral BID BM   multivitamin with minerals  1 tablet Oral Daily   rosuvastatin  40 mg Oral Daily   sertraline  100 mg Oral BID   Continuous Infusions:  Principal Problem:   Contact with powered lawnmower as cause of accidental injury at home as place of occurrence Active Problems:   HTN (hypertension)   Closed L2 vertebral fracture (HCC)   Closed L3 vertebral fracture (HCC)   CKD (chronic kidney disease) stage 3, GFR 30-59 ml/min (HCC)   OSA on CPAP   Pulmonary nodule   Fall   Malnutrition of moderate degree   LOS: 3 days   A & P  L3 fracture with retropulsion/mild L2 compression fracture -Caused by a lawnmower rollover -Cody Hines recommended TLSO brace -PT/OT consulted; patient will need skilled nursing facility -Continue pain management with Dilaudid as needed -Start Robaxin 500 mg p.o. every 6 hours as needed for muscle spasm   Pulmonary nodule -Seen on CT thoracic spine -Will need repeat CT chest in 6 to 12 months   Hypertension -Continue enalapril  CKD stage IIIb, AKI now resolving. -Creatinine 1.44 -At baseline  I have seen and examined this patient myself. I have spent 34 minutes in his evaluation and care.  Status is: Inpatient   Dispo: The patient is from: Home              Anticipated d/c is to: Skilled nursing facility              Anticipated d/c date is: 06/27/2021              Patient currently not stable for discharge due to lack of safe discharge.   Barrier to discharge-we will  need skilled nursing facility for rehab   Cody Vanes, DO [facility] Direct contact: see [email or web address]  7PM-7AM contact night coverage as above 06/26/2021, 6:51 PM  LOS: 3 days

## 2021-06-26 NOTE — Progress Notes (Signed)
Placed patient on CPAP via nasal mask, auto titrate with 1L O2 bleed in.

## 2021-06-27 DIAGNOSIS — S32039A Unspecified fracture of third lumbar vertebra, initial encounter for closed fracture: Secondary | ICD-10-CM | POA: Diagnosis not present

## 2021-06-27 DIAGNOSIS — W28XXXA Contact with powered lawn mower, initial encounter: Secondary | ICD-10-CM | POA: Diagnosis not present

## 2021-06-27 DIAGNOSIS — N183 Chronic kidney disease, stage 3 unspecified: Secondary | ICD-10-CM | POA: Diagnosis not present

## 2021-06-27 DIAGNOSIS — S32029A Unspecified fracture of second lumbar vertebra, initial encounter for closed fracture: Secondary | ICD-10-CM | POA: Diagnosis not present

## 2021-06-27 MED ORDER — ADULT MULTIVITAMIN W/MINERALS CH: 1.0000 | ORAL_TABLET | Freq: Every day | ORAL | 0 refills | Status: DC

## 2021-06-27 MED ORDER — TRAMADOL HCL 50 MG PO TABS: 50.0000 mg | ORAL_TABLET | Freq: Four times a day (QID) | ORAL | 0 refills | Status: DC | PRN

## 2021-06-27 MED ORDER — HYDROXYZINE HCL 25 MG PO TABS
25.0000 mg | ORAL_TABLET | Freq: Once | ORAL | Status: AC
  Administered 2021-06-27: 25 mg via ORAL
  Filled 2021-06-27: qty 1

## 2021-06-27 MED ORDER — METHOCARBAMOL 500 MG PO TABS: 500.0000 mg | ORAL_TABLET | Freq: Four times a day (QID) | ORAL | 0 refills | Status: DC | PRN

## 2021-06-27 MED ORDER — TYLENOL 8 HOUR 650 MG PO TBCR: 1300.0000 mg | EXTENDED_RELEASE_TABLET | Freq: Every day | ORAL | 0 refills | Status: DC | PRN

## 2021-06-27 MED ORDER — ENSURE ENLIVE PO LIQD: 237.0000 mL | Freq: Two times a day (BID) | ORAL | 12 refills | Status: DC

## 2021-06-27 MED ORDER — PHENYLEPHRINE HCL-NACL 20-0.9 MG/250ML-% IV SOLN
INTRAVENOUS | Status: AC
  Filled 2021-06-27: qty 500

## 2021-06-27 NOTE — Progress Notes (Signed)
Occupational Therapy Treatment Patient Details Name: Cody Hines. MRN: [id number] DOB: [birth year] Today's Date: 06/27/2021   History of present illness 75 y.o. male presented to the ED 06/22/21 after his lawn mower flipped and trapped him under it for about 15 mins. L3 Fx with retropulsion and mild L2 compression fx--both stable per neurosurgery.  PMH  significant of HTN, OSA on CPAP, MCI, prior T12 compression fx s/p vertebroplasty Dec 2020, ?early PD   OT comments  Pt progressing towards established OT goals. Pt continues to present with decreased cognition, balance, and strength. Pt donning brace with Mod A for sequencing and positioning. Pt performing grooming at sink with Min Guard-Min A for balance and cues for compensatory techniques to adhere to back precautions. Continue to recommend dc to SNF and will continue to follow acutely as admitted.   Recommendations for follow up therapy are one component of a multi-disciplinary discharge planning process, led by the attending physician.  Recommendations may be updated based on patient status, additional functional criteria and insurance authorization.    Follow Up Recommendations  SNF;Supervision/Assistance - 24 hour    Equipment Recommendations  None recommended by OT    Recommendations for Other Services PT consult    Precautions / Restrictions Precautions Precautions: Back;Fall Precaution Comments: reviewed back precautions with pt and son Required Braces or Orthoses: Spinal Brace Spinal Brace: Thoracolumbosacral orthotic;Applied in sitting position Restrictions Weight Bearing Restrictions: No       Mobility Bed Mobility Overal bed mobility: Needs Assistance Bed Mobility: Rolling;Sidelying to Sit Rolling: Min guard Sidelying to sit: Min guard       General bed mobility comments: Min Guard A for safety    Transfers Overall transfer level: Needs assistance Equipment used: Rolling walker (2  wheeled) Transfers: Sit to/from Stand Sit to Stand: Min assist         General transfer comment: Min A for balance in standing    Balance Overall balance assessment: Needs assistance Sitting-balance support: Feet supported;No upper extremity supported Sitting balance-Leahy Scale: Fair     Standing balance support: No upper extremity supported;During functional activity;Single extremity supported Standing balance-Leahy Scale: Fair Standing balance comment: able to maintain standing at sink without ue support                           ADL either performed or assessed with clinical judgement   ADL Overall ADL's : Needs assistance/impaired     Grooming: Oral care;Minimal assistance;Min guard;Standing;Cueing for compensatory techniques;Cueing for sequencing Grooming Details (indicate cue type and reason): Min A for balance; moments of MIn GUard A         Upper Body Dressing : Moderate assistance;Sitting Upper Body Dressing Details (indicate cue type and reason): Providing education on brace management. Mod A for donning brace                 Functional mobility during ADLs: Minimal assistance;Rolling walker General ADL Comments: Pt with decreased balance, cognition, and strength     Vision       Perception     Praxis      Cognition Arousal/Alertness: Awake/alert Behavior During Therapy: WFL for tasks assessed/performed;Impulsive;Flat affect Overall Cognitive Status: Impaired/Different from baseline Area of Impairment: Problem solving;Awareness;Safety/judgement;Following commands;Memory;Attention;Orientation                   Current Attention Level: Selective Memory: Decreased recall of precautions;Decreased short-term memory Following Commands: Follows one step commands inconsistently;Follows one  step commands with increased time Safety/Judgement: Decreased awareness of safety;Decreased awareness of deficits Awareness:  Intellectual Problem Solving: Slow processing;Requires verbal cues;Requires tactile cues General Comments: Pt with no recall of back precautions. Presenting with decreased arousal and closing his eyes often at the begining of session; once out of bed, pt more awake. Pt requiring cues and increased time throughout        Exercises     Shoulder Instructions       General Comments      Pertinent Vitals/ Pain       Pain Assessment: Faces Faces Pain Scale: Hurts little more Pain Location: back Pain Descriptors / Indicators: Constant;Discomfort;Grimacing Pain Intervention(s): Monitored during session;Limited activity within patient's tolerance;Repositioned  Home Living                                          Prior Functioning/Environment              Frequency  Min 2X/week        Progress Toward Goals  OT Goals(current goals can now be found in the care plan section)  Progress towards OT goals: Progressing toward goals  Acute Rehab OT Goals Patient Stated Goal: to have less pain OT Goal Formulation: With patient/family Time For Goal Achievement: 07/07/21 Potential to Achieve Goals: Good ADL Goals Pt Will Perform Eating: with modified independence;sitting Pt Will Perform Grooming: standing;with min guard assist Pt Will Perform Upper Body Bathing: with set-up;with supervision;sitting Pt Will Perform Lower Body Bathing: with min assist;with adaptive equipment;sit to/from stand Pt Will Perform Upper Body Dressing: with set-up;with supervision;sitting Pt Will Perform Lower Body Dressing: with min assist;with adaptive equipment;sit to/from stand Pt Will Transfer to Toilet: with min assist;ambulating;regular height toilet;bedside commode;grab bars Pt Will Perform Toileting - Clothing Manipulation and hygiene: with min assist;sit to/from stand;with adaptive equipment  Plan Discharge plan remains appropriate    Co-evaluation    PT/OT/SLP  Co-Evaluation/Treatment: Yes Reason for Co-Treatment: For patient/therapist safety;To address functional/ADL transfers   OT goals addressed during session: ADL's and self-care      AM-PAC OT "6 Clicks" Daily Activity     Outcome Measure   Help from another person eating meals?: A Little Help from another person taking care of personal grooming?: A Lot Help from another person toileting, which includes using toliet, bedpan, or urinal?: Total Help from another person bathing (including washing, rinsing, drying)?: A Lot Help from another person to put on and taking off regular upper body clothing?: A Lot Help from another person to put on and taking off regular lower body clothing?: Total 6 Click Score: 11    End of Session Equipment Utilized During Treatment: Back brace;Rolling walker  OT Visit Diagnosis: Unsteadiness on feet (R26.81);Pain Pain - part of body:  (Back)   Activity Tolerance Patient tolerated treatment well   Patient Left in chair;with call bell/phone within reach;with chair alarm set   Nurse Communication Mobility status        Time: 8469-6295 OT Time Calculation (min): 28 min  Charges: OT General Charges $OT Visit: 1 Visit OT Treatments $Self Care/Home Management : 23-37 mins  [place], OTR/L Acute Rehab Pager: [phone] Office: [place] 06/27/2021, 10:51 AM

## 2021-06-27 NOTE — Progress Notes (Signed)
PROGRESS NOTE  Glendon Philmore [name]. ZTI:[id number] DOB: 11/30/1945 DOA: 06/22/2021 PCP: [name], MD  Brief History   75 year old male with history of hypertension, OSA on CPAP, prior T12 compression fracture status post vertebroplasty by Dr. Arnoldo Morale in December 2020 presented to ED after patient had trauma at home.  Patient lawnmower flipped and trapped him under it for about 15 minutes.  Patient developed severe low back pain but no neurological deficit. In the ED, CT of the lumbar spine showed L3 vertebral body fracture with mild retropulsion of fracture fragments along the superior endplate.  Neurosurgery was consulted and recommended TLSO brace.  Nonoperative management.  Recommendation is for SNF. Plan is for son to take patient by private car tomorrow.  Consultants  None  Procedures  None  Antibiotics   Anti-infectives (From admission, onward)    None      Subjective  The patient is without new complaints. His son is at bedside.  Objective   Vitals:  Vitals:   06/27/21 0808 06/27/21 1718  BP: (!) 141/80 125/64  Pulse: 70 64  Resp: 18 18  Temp: 98.4 F (36.9 C) (!) 97.4 F (36.3 C)  SpO2: 97% 96%    Exam:  Constitutional:  The patient is awake, alert, and oriented x 3. No acute distress. ERespiratory:  CTA bilaterally, no w/r/r.  Respiratory effort normal. No retractions or accessory muscle use Cardiovascular:  RRR, no m/r/g No LE extremity edema   Normal pedal pulses Abdomen:  Abdomen appears normal; no tenderness or masses No hernias No HSM Musculoskeletal:  Digits/nails BUE: no clubbing, cyanosis, petechiae, infection exam of joints, bones, muscles of at least one of following: head/neck, RUE, LUE, RLE, LLE   strength and tone normal, no atrophy, no abnormal movements Skin:  No rashes, lesions, ulcers palpation of skin: no induration or nodules Neurologic:  CN 2-12 intact Sensation all 4 extremities intact Psychiatric:   Mental status: Awake, alert, and oriented x 3. Mood, affect appropriate Orientation to person, place, time  judgment and insight appear intact     I have personally reviewed the following:   Today's Data  Vitals  Micro Data  COVID -  Imaging  CT C-spine CT Chest, Abdomen, and Pelvis CT head without contrast  Cardiology Data  EKG  Scheduled Meds:  enalapril  20 mg Oral Daily   feeding supplement  237 mL Oral BID BM   multivitamin with minerals  1 tablet Oral Daily   rosuvastatin  40 mg Oral Daily   sertraline  100 mg Oral BID   Continuous Infusions:  Principal Problem:   Contact with powered lawnmower as cause of accidental injury at home as place of occurrence Active Problems:   HTN (hypertension)   Closed L2 vertebral fracture (HCC)   Closed L3 vertebral fracture (HCC)   CKD (chronic kidney disease) stage 3, GFR 30-59 ml/min (HCC)   OSA on CPAP   Pulmonary nodule   Fall   Malnutrition of moderate degree   LOS: 4 days   A & P  L3 fracture with retropulsion/mild L2 compression fracture -Caused by a lawnmower rollover -Neurosurgery Dr. Kathyrn Sheriff recommended TLSO brace -PT/OT consulted; patient will need skilled nursing facility -Continue pain management with Dilaudid as needed -Start Robaxin 500 mg p.o. every 6 hours as needed for muscle spasm   Pulmonary nodule -Seen on CT thoracic spine -Will need repeat CT chest in 6 to 12 months   Hypertension -Continue enalapril   CKD stage IIIb, AKI now  resolving. -Creatinine 1.44 -At baseline  I have seen and examined this patient myself. I have spent 32 minutes in his evaluation and care.  Status is: Inpatient   Dispo: The patient is from: Home              Anticipated d/c is to: Skilled nursing facility              Anticipated d/c date is: 06/28/2021              Patient currently not stable for discharge due to lack of safe discharge.   Barrier to discharge-we will need skilled nursing facility for  rehab   Jasenia Weilbacher, DO [facility] Direct contact: see [email or web address]  7PM-7AM contact night coverage as above 06/27/2021, 6:29 PM  LOS: 3 days

## 2021-06-27 NOTE — Progress Notes (Signed)
Pt refused to wear CPAP tonight. ?

## 2021-06-27 NOTE — Progress Notes (Signed)
Pt refused CPAP multiple times.  CPAP removed from room.

## 2021-06-27 NOTE — TOC Progression Note (Signed)
Transition of Care (TOC) - Progression Note    Patient Details  Name: Cody Hines. MRN: [id number] Date of Birth: 02/05/1946  Transition of Care [facility]) CM/SW Contact  Emeterio Reeve, [place] Phone Number: 06/27/2021, 3:22 PM  Clinical Narrative:     CSW confirmed that [facility] can accept pt for SNF. They can accept pt tomorrow. CSW started insurance auth, references number [id number]. Pts covid test was completed on 06/26/21 and he does not need another.   Pts son is location covid vaccine card. CSW will try to find it through other avenues.   CSW will fax DC summary and covid card to [facility] at [phone].   4:33pm- Insurance Josem Kaufmann is approved , Josem Kaufmann ID number is [id number]  Expected Discharge Plan: [place] Barriers to Discharge: Continued Medical Work up  Expected Discharge Plan and Services Expected Discharge Plan: [place] arrangements for the past 2 months: Single Family Home Expected Discharge Date: 06/27/21                                     Social Determinants of Health (SDOH) Interventions    Readmission Risk Interventions No flowsheet data found.  Emeterio Reeve, LCSW Clinical Social Worker

## 2021-06-27 NOTE — Progress Notes (Signed)
Physical Therapy Treatment Patient Details Name: Cody Hines. MRN: [id number] DOB: 1945/10/27 Today's Date: 06/27/2021   History of Present Illness 75 y.o. male presented to the ED 06/22/21 after his lawn mower flipped and trapped him under it for about 15 mins. L3 Fx with retropulsion and mild L2 compression fx--both stable per neurosurgery.  PMH  significant of HTN, OSA on CPAP, MCI, prior T12 compression fx s/p vertebroplasty Dec 2020, ?early PD    PT Comments    Pt admitted with above diagnosis. Pt was able to ambulate with RW and incr distance today. Education with son complete with son feeling comfortable asssisting pt into car tomorrow to take pt to [place]. Also son understands brace application and back precautions. Issued gait belt for son to use.   Pt currently with functional limitations due to balance and endurance deficits. Pt will benefit from skilled PT to increase their independence and safety with mobility to allow discharge to the venue listed below.      Recommendations for follow up therapy are one component of a multi-disciplinary discharge planning process, led by the attending physician.  Recommendations may be updated based on patient status, additional functional criteria and insurance authorization.  Follow Up Recommendations  SNF     Equipment Recommendations  Rolling walker with 5" wheels    Recommendations for Other Services       Precautions / Restrictions Precautions Precautions: Back;Fall Precaution Booklet Issued: No Precaution Comments: reviewed back precautions with pt and son Required Braces or Orthoses: Spinal Brace Cervical Brace: Other (comment) (discontinued today) Spinal Brace: Thoracolumbosacral orthotic;Applied in sitting position Restrictions Weight Bearing Restrictions: No     Mobility  Bed Mobility Overal bed mobility: Needs Assistance Bed Mobility: Rolling;Sidelying to Sit Rolling: Min guard Sidelying to sit: Min  guard       General bed mobility comments: Pt in chair on arrival.  Educated pt and son regarding brace application, back precautions and use of gait belt. Also educated in how to assist pt.in and out of bed and chair as well as simulation of in and out of car.    Transfers Overall transfer level: Needs assistance Equipment used: Rolling walker (2 wheeled) Transfers: Sit to/from Stand Sit to Stand: Min assist         General transfer comment: Min A for balance in standing  Ambulation/Gait Ambulation/Gait assistance: Min assist Gait Distance (Feet): 100 Feet Assistive device: Rolling walker (2 wheeled) Gait Pattern/deviations: Step-to pattern;Shuffle;Wide base of support;Trunk flexed Gait velocity: reduced Gait velocity interpretation: <1.8 ft/sec, indicate of risk for recurrent falls General Gait Details: Pt able to incr ambulation with RW with cues for sequencing steps and RW.  Pt flexed forward PT cued pt to stand tall.  Pt needed min guard assist to control balance.   Stairs             Wheelchair Mobility    Modified Rankin (Stroke Patients Only)       Balance Overall balance assessment: Needs assistance Sitting-balance support: Feet supported;No upper extremity supported Sitting balance-Leahy Scale: Fair     Standing balance support: No upper extremity supported;During functional activity;Single extremity supported Standing balance-Leahy Scale: Poor Standing balance comment: relies on UE support                            Cognition Arousal/Alertness: Awake/alert Behavior During Therapy: WFL for tasks assessed/performed;Impulsive;Flat affect Overall Cognitive Status: Impaired/Different from baseline Area of Impairment: Problem  solving;Awareness;Safety/judgement;Following commands;Memory;Attention;Orientation                 Orientation Level: Situation Current Attention Level: Selective Memory: Decreased recall of  precautions;Decreased short-term memory Following Commands: Follows one step commands inconsistently;Follows one step commands with increased time Safety/Judgement: Decreased awareness of safety;Decreased awareness of deficits Awareness: Intellectual Problem Solving: Slow processing;Requires verbal cues;Requires tactile cues General Comments: Pt with no recall of back precautions. Presenting with decreased arousal and closing his eyes often. Pt requiring cues and increased time throughout      Exercises      General Comments General comments (skin integrity, edema, etc.): Gave pt handout regarding back precautions and discussed with pt and son.      Pertinent Vitals/Pain Pain Assessment: Faces Faces Pain Scale: Hurts little more Pain Location: back Pain Descriptors / Indicators: Constant;Discomfort;Grimacing Pain Intervention(s): Limited activity within patient's tolerance;Monitored during session;Premedicated before session;Repositioned    Home Living                      Prior Function            PT Goals (current goals can now be found in the care plan section) Acute Rehab PT Goals Patient Stated Goal: to have less pain Progress towards PT goals: Progressing toward goals    Frequency    Min 3X/week      PT Plan Current plan remains appropriate    Co-evaluation   Reason for Co-Treatment: For patient/therapist safety;To address functional/ADL transfers   OT goals addressed during session: ADL's and self-care      AM-PAC PT "6 Clicks" Mobility   Outcome Measure  Help needed turning from your back to your side while in a flat bed without using bedrails?: A Little Help needed moving from lying on your back to sitting on the side of a flat bed without using bedrails?: A Little Help needed moving to and from a bed to a chair (including a wheelchair)?: A Lot Help needed standing up from a chair using your arms (e.g., wheelchair or bedside chair)?: A  Little Help needed to walk in hospital room?: A Little Help needed climbing 3-5 steps with a railing? : A Lot 6 Click Score: 16    End of Session Equipment Utilized During Treatment: Gait belt;Back brace;Oxygen Activity Tolerance: Patient limited by fatigue;Patient limited by pain Patient left: with call bell/phone within reach;in chair;with chair alarm set;with family/visitor present Nurse Communication: Mobility status PT Visit Diagnosis: Repeated falls (R29.6);Muscle weakness (generalized) (M62.81);Difficulty in walking, not elsewhere classified (R26.2);Dizziness and giddiness (R42)     Time: 7902-4097 PT Time Calculation (min) (ACUTE ONLY): 35 min  Charges:  $Gait Training: 8-22 mins $Self Care/Home Management: 8-22                     [facility] M,PT Acute Rehab Services [phone] (pager)    Alvira Philips 06/27/2021, 1:13 PM

## 2021-06-28 ENCOUNTER — Encounter: Payer: Self-pay | Admitting: Adult Health

## 2021-06-28 DIAGNOSIS — S32039A Unspecified fracture of third lumbar vertebra, initial encounter for closed fracture: Secondary | ICD-10-CM | POA: Diagnosis not present

## 2021-06-28 DIAGNOSIS — W28XXXA Contact with powered lawn mower, initial encounter: Secondary | ICD-10-CM | POA: Diagnosis not present

## 2021-06-28 DIAGNOSIS — S32029A Unspecified fracture of second lumbar vertebra, initial encounter for closed fracture: Secondary | ICD-10-CM | POA: Diagnosis not present

## 2021-06-28 DIAGNOSIS — N183 Chronic kidney disease, stage 3 unspecified: Secondary | ICD-10-CM | POA: Diagnosis not present

## 2021-06-28 MED ORDER — OXYCODONE HCL 5 MG PO TABS: 5.0000 mg | ORAL_TABLET | Freq: Three times a day (TID) | ORAL | 0 refills | Status: DC | PRN

## 2021-06-28 NOTE — TOC Transition Note (Signed)
Transition of Care [facility]) - CM/SW Discharge Note   Patient Details  Name: Cody Hines. MRN: [id number] Date of Birth: 05-09-1946  [facility] Care [facility]) CM/SW Contact:  Trula Ore, [place] Phone Number: 06/28/2021, 10:16 AM   Clinical Narrative:     Patient will DC to: [facility]   Anticipated DC date: 06/28/2021  Family notified: Alroy Dust  Transport by: Patients son Alroy Dust  ?  Per MD patient ready for DC to [facility]. RN, patient, patient's family, and facility notified of DC. Patients son confirmed he will bring patients Cpap from home to SNF. Facility notified.Discharge Summary sent to facility. RN given number for report tele# [phone] RM#[id number]. DC packet on chart. Patients son will  transport patient to SNF. CSW signing off.   Final next level of care: Skilled Nursing Facility Barriers to Discharge: No Barriers Identified   Patient Goals and CMS Choice Patient states their goals for this hospitalization and ongoing recovery are:: SNF CMS Medicare.gov Compare Post Acute Care list provided to:: Patient Represenative (must comment) Alroy Dust) Choice offered to / list presented to : Adult Children Alroy Dust)  Discharge Placement              Patient chooses bed at:  ([facility]) Patient to be transferred to facility by: by Patients Son Alroy Dust Name of family member notified: Alroy Dust Patient and family notified of of transfer: 06/28/21  Discharge Plan and Services                                     Social Determinants of Health (SDOH) Interventions     Readmission Risk Interventions No flowsheet data found.

## 2021-06-28 NOTE — Progress Notes (Signed)
Cody Hines [name]. to be D/C'd  per MD order.  Discussed with the patient and all questions fully answered.  VSS, Skin clean, dry and intact without evidence of skin break down, no evidence of skin tears noted.  IV catheter discontinued intact. Site without signs and symptoms of complications. Dressing and pressure applied.  An After Visit Summary was printed and given to the patient. Patient received prescription. Son to escort patient to [facility] in [place], [place].  Patient instructed to return to ED, call 911, or call MD for any changes in condition.   Patient to be escorted via [place], and D/C home via private auto.

## 2021-06-28 NOTE — Discharge Summary (Signed)
Physician Discharge Summary  Cody Hines Cody Hines. YNW:[id number] DOB: 12/09/1945 DOA: 06/22/2021  PCP: [name], MD  Admit date: 06/22/2021 Discharge date: 06/28/2021  Recommendations for Outpatient Follow-up:  Patient is to discharge to SNF today for ongoing rehab. Follow up with Dr. Kathyrn Sheriff in 4 weeks. Wear brace when out of bed.  Discharge Diagnoses: Principal diagnosis is #1 L3 frcture with retropulsion Mild L2 fracture Pulmonary nodule  Hypertension CKD IIIb  Discharge Condition: fair Disposition: SNF  Diet recommendation: Heart healthy  Filed Weights   06/22/21 1523  Weight: 88.5 kg    History of present illness:  Cody Hines Cody Hines. is a 75 y.o. male with medical history significant of HTN, OSA on CPAP, MCI, prior T12 compression fx s/p vertebroplasty by Dr. Arnoldo Morale in Dec 2020. Pt presents to the ED as a trauma 1 after his lawn mower flipped and trapped him under it for about 15 mins.  Severe lower back pain following injury but no new neurologic deficit. Back pain is midline, severe, worse with palpation or movement. Small abrasions over back and forearms.  No fevers, chills. L3 Fx with retropulsion, L2 mild compression deformity. Trauma has signed off. NS said non-op management, TLSO brace, pain control.  Patient has been accepted to SNF for rehab.  Hospital Course:  The patient was admitted to a regular bed. He has been fitted with a TLSO brace and has been evaluated by PT/OT. His pain has been controlled.   He is doing well and is appropriate for discharge to rehab.  Today's assessment: S: The patient is resting comfortably. No new complaints. O: Vitals:  Vitals:   06/27/21 2132 06/28/21 0445  BP: 137/77 (!) 135/55  Pulse: 69 66  Resp: 18 16  Temp: 97.9 F (36.6 C) 97.8 F (36.6 C)  SpO2: 94% 93%    Constitutional:  The patient is awake, alert, and oriented x 3. No acute distress. Respiratory:  CTA bilaterally, no  w/r/r.  Respiratory effort normal. No retractions or accessory muscle use Cardiovascular:  RRR, no m/r/g No LE extremity edema   Normal pedal pulses Abdomen:  Abdomen appears normal; no tenderness or masses No hernias No HSM Musculoskeletal:  Digits/nails: no clubbing, cyanosis, petechiae, infection exam of joints, bones, muscles of at least one of following: head/neck, RUE, LUE, RLE, LLE   Skin:  No rashes, lesions, ulcers palpation of skin: no induration or nodules Neurologic:  CN 2-12 intact Sensation all 4 extremities intact Movign all extremities Psychiatric:  judgement and insight appear normal Mental status Mood, affect appropriate Orientation to person, place, time   Discharge Instructions  Discharge Instructions     Activity as tolerated - No restrictions   Complete by: As directed    Call MD for:  persistant nausea and vomiting   Complete by: As directed    Call MD for:  severe uncontrolled pain   Complete by: As directed    Diet - low sodium heart healthy   Complete by: As directed    Discharge instructions   Complete by: As directed    Discharge to SNF for rehab. Diet 2 gm Na Needs to have follow up CT chest to follow lung nodule in 3-6 months. Follow up with PCP in 7-10 days after discharge from facility. Have chemistry drawn at that visit and reported to PCP.   Increase activity slowly   Complete by: As directed       Allergies as of 06/28/2021       Reactions  Aspirin Diarrhea, Nausea Only, Other (See Comments)   History of ulcers, also   Atorvastatin Other (See Comments)   Muscle aches   Celebrex [celecoxib] Other (See Comments)   Headaches   Codeine Nausea And Vomiting   Savella [milnacipran] Other (See Comments)   Reaction not recalled   Viagra [sildenafil] Other (See Comments)   Headaches   Penicillins Rash        Medication List     STOP taking these medications    FLAX SEED OIL PO   MAGNESIUM PO       TAKE these  medications    enalapril 20 MG tablet Commonly known as: VASOTEC Take 20 mg by mouth daily.   feeding supplement Liqd Take 237 mLs by mouth 2 (two) times daily between meals.   methocarbamol 500 MG tablet Commonly known as: ROBAXIN Take 1 tablet (500 mg total) by mouth every 6 (six) hours as needed for muscle spasms.   multivitamin with minerals Tabs tablet Take 1 tablet by mouth daily.   oxyCODONE 5 MG immediate release tablet Commonly known as: Roxicodone Take 1 tablet (5 mg total) by mouth every 8 (eight) hours as needed.   rosuvastatin 40 MG tablet Commonly known as: CRESTOR Take 40 mg by mouth daily.   sertraline 100 MG tablet Commonly known as: ZOLOFT Take 100 mg by mouth in the morning and at bedtime.   Tylenol 8 Hour 650 MG CR tablet Generic drug: acetaminophen Take 2 tablets (1,300 mg total) by mouth daily as needed for pain. What changed: how much to take   VITAMIN D-3 PO Take 1 capsule by mouth daily with breakfast.       Allergies  Allergen Reactions   Aspirin Diarrhea, Nausea Only and Other (See Comments)    History of ulcers, also   Atorvastatin Other (See Comments)    Muscle aches   Celebrex [Celecoxib] Other (See Comments)    Headaches    Codeine Nausea And Vomiting   Savella [Milnacipran] Other (See Comments)    Reaction not recalled   Viagra [Sildenafil] Other (See Comments)    Headaches   Penicillins Rash    The results of significant diagnostics from this hospitalization (including imaging, microbiology, ancillary and laboratory) are listed below for reference.    Significant Diagnostic Studies: CT HEAD WO CONTRAST  Result Date: 06/22/2021 CLINICAL DATA:  Head trauma. EXAM: CT HEAD WITHOUT CONTRAST CT CERVICAL SPINE WITHOUT CONTRAST TECHNIQUE: Multidetector CT imaging of the head and cervical spine was performed following the standard protocol without intravenous contrast. Multiplanar CT image reconstructions of the cervical spine were  also generated. COMPARISON:  CT scan of the brain June 22, 2019. FINDINGS: CT HEAD FINDINGS Brain: No subdural, epidural, or subarachnoid hemorrhage. Ventricles are mildly prominent but stable. Sulci are stable. No mass effect or midline shift. No acute cortical ischemia or infarct. Cerebellum, brainstem, and basal cisterns are normal. Vascular: No hyperdense vessel or unexpected calcification. Skull: Normal. Negative for fracture or focal lesion. Sinuses/Orbits: No acute finding. Other: None. CT CERVICAL SPINE FINDINGS Alignment: Normal. Skull base and vertebrae: No acute fracture. No primary bone lesion or focal pathologic process. Soft tissues and spinal canal: No prevertebral fluid or swelling. No visible canal hematoma. Disc levels: Multilevel degenerative disc disease, mild. Mild facet degenerative changes. Upper chest: Negative. Other: A sclerotic focus is seen in the C4 vertebral body. IMPRESSION: 1. No acute intracranial abnormalities. 2. No fracture or traumatic malalignment in the cervical spine. 3. The rounded region of  sclerosis in the C4 vertebral body is nonspecific but may represent a bone island. Electronically Signed   By: Dorise Bullion III M.D.   On: 06/22/2021 15:53   CT CERVICAL SPINE WO CONTRAST  Result Date: 06/22/2021 CLINICAL DATA:  MVC, trauma EXAM: CT CERVICAL SPINE WITHOUT CONTRAST TECHNIQUE: Multidetector CT imaging of the cervical spine was performed without intravenous contrast. Multiplanar CT image reconstructions were also generated. COMPARISON:  None. FINDINGS: Alignment: Normal. Skull base and vertebrae: No acute fracture. No suspicious primary bone lesion or focal pathologic process. Incidental small benign densely ossified bone island of C4 (series 8, image 43). Soft tissues and spinal canal: No prevertebral fluid or swelling. No visible canal hematoma. Disc levels: Minimal multilevel disc space height loss and osteophytosis throughout. Upper chest: Negative. Other:  None. IMPRESSION: 1. No fracture or static subluxation of the cervical spine. 2. Minimal multilevel disc space height loss and osteophytosis throughout. Electronically Signed   By: Eddie Candle M.D.   On: 06/22/2021 15:50   DG Pelvis Portable  Result Date: 06/22/2021 CLINICAL DATA:  Riding mower flipped on top of patient. EXAM: PORTABLE PELVIS 1-2 VIEWS COMPARISON:  None. FINDINGS: The left hip and lateral aspect of the left iliac crest are not included on this film. There are degenerative changes in the right hip with osteophytes and mild loss of joint space. The left femoral head is unremarkable. Pelvic bones are unremarkable. No other bony or soft tissue abnormalities identified. IMPRESSION: The left hip and lateral left iliac crest are not included on this study. Degenerative changes in the right hip. No other abnormalities. Electronically Signed   By: Dorise Bullion III M.D.   On: 06/22/2021 15:43   CT CHEST ABDOMEN PELVIS W CONTRAST  Result Date: 06/22/2021 CLINICAL DATA:  Trauma. EXAM: CT CHEST, ABDOMEN, AND PELVIS WITH CONTRAST TECHNIQUE: Multidetector CT imaging of the chest, abdomen and pelvis was performed following the standard protocol during bolus administration of intravenous contrast. Reformats of the thoracic and lumbar spine were performed from CT chest abdomen and pelvis and viewed separately. CONTRAST:  80 mL Omnipaque 300 COMPARISON:  CT chest abdomen and pelvis 06/22/2019. FINDINGS: CT CHEST FINDINGS Cardiovascular: The heart is mildly enlarged, similar to prior. Aorta is normal in size. There is calcified atherosclerotic disease throughout the aorta. There is no pericardial effusion. Mediastinum/Nodes: No enlarged mediastinal, hilar, or axillary lymph nodes. Thyroid gland, trachea, and esophagus demonstrate no significant findings. Lungs/Pleura: Again seen is pleuroparenchymal scarring in both lung apices. There is minimal dependent atelectasis in the lung bases. There is a 6 mm  nodular density in the left lower lobe image 5/112, new from the prior study. No pleural effusion or pneumothorax. Musculoskeletal: There are new vertebroplasty changes at T12. Compression deformity of T12 appears grossly unchanged. No acute fractures are seen. CT ABDOMEN PELVIS FINDINGS Hepatobiliary: The liver is mildly enlarged with nodular liver contour compatible with cirrhosis, unchanged from the prior examination. No focal liver lesions are identified. Gallstones are again seen. There is no biliary ductal dilatation. Pancreas: Unremarkable. No pancreatic ductal dilatation or surrounding inflammatory changes. Spleen: Normal in size without focal abnormality. Adrenals/Urinary Tract: Severe right renal atrophy appears unchanged. The left kidney is within normal limits. The bilateral adrenal glands are within normal limits. There is no hydronephrosis or perinephric fluid. Bladder appears normal. Stomach/Bowel: Stomach is within normal limits. Appendix appears normal. No evidence of bowel wall thickening, distention, or inflammatory changes. Vascular/Lymphatic: Aortic atherosclerosis. No enlarged abdominal or pelvic lymph nodes. 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 Reproductive: Prostate is unremarkable. Other: There is no evidence for ascites or focal abdominal wall hernia. There is no retroperitoneal hernia. There is no focal body wall hematoma. Musculoskeletal: Degenerative changes affect the spine and hips. Please see below. CT thoracic spine findings There are new vertebroplasty changes at T12. No acute compression fractures are seen. There is no retropulsion of fracture fragments. The bones are osteopenic. There is disc space narrowing and endplate osteophyte formation throughout the thoracic spine compatible with mild degenerative change which is similar to the prior study. There is no significant central canal or neural foraminal stenosis. Perivertebral soft tissues are within limits. There is no central  spinal canal hematoma. CT lumbar spine findings There is acute comminuted fracture through L2 vertebral body. There is approximately 25% loss vertebral body height. There is retropulsion of fracture fragments along the superior endplate measuring 3 mm. There is no significant central canal stenosis. There is no neural foraminal stenosis. There is also mild acute compression fracture of the superior endplate of L2 without retropulsion of fracture fragments. There is 5% vertebral body height loss. Spinal alignment is within normal limits. There is mild disc space narrowing at L5-S1 compatible with degenerative change. Disc spaces are otherwise well maintained. There is mild bilateral neural foraminal stenosis, left greater than right at L4-L5 and L5-S1 secondary to facet arthropathy. Paravertebral soft tissues are within normal limits. There is no definite central spinal canal hematoma. IMPRESSION: 1. Acute fracture L3 vertebral body with mild retropulsion of fracture fragments along the superior endplate. Mild central spinal canal stenosis. 2. Mild acute compression fracture superior endplate of L2. No retropulsion of fracture fragments. 3. No acute additional posttraumatic sequelae in the chest, abdomen or pelvis. 4. Liver cirrhosis. 5. Cholelithiasis. 6. New 6 mm left lower lobe nodule Non-contrast chest CT at 6-12 months is recommended. If the nodule is stable at time of repeat CT, then future CT at 18-24 months (from today's scan) is considered optional for low-risk patients, but is recommended for high-risk patients. This recommendation follows the consensus statement: Guidelines for Management of Incidental Pulmonary Nodules Detected on CT Images: From the Fleischner Society 2017; Radiology 2017; 284:228-243. Electronically Signed   By: Ronney Asters M.D.   On: 06/22/2021 16:25   CT T-SPINE NO CHARGE  Result Date: 06/22/2021 CLINICAL DATA:  Trauma. EXAM: CT CHEST, ABDOMEN, AND PELVIS WITH CONTRAST  TECHNIQUE: Multidetector CT imaging of the chest, abdomen and pelvis was performed following the standard protocol during bolus administration of intravenous contrast. Reformats of the thoracic and lumbar spine were performed from CT chest abdomen and pelvis and viewed separately. CONTRAST:  80 mL Omnipaque 300 COMPARISON:  CT chest abdomen and pelvis 06/22/2019. FINDINGS: CT CHEST FINDINGS Cardiovascular: The heart is mildly enlarged, similar to prior. Aorta is normal in size. There is calcified atherosclerotic disease throughout the aorta. There is no pericardial effusion. Mediastinum/Nodes: No enlarged mediastinal, hilar, or axillary lymph nodes. Thyroid gland, trachea, and esophagus demonstrate no significant findings. Lungs/Pleura: Again seen is pleuroparenchymal scarring in both lung apices. There is minimal dependent atelectasis in the lung bases. There is a 6 mm nodular density in the left lower lobe image 5/112, new from the prior study. No pleural effusion or pneumothorax. Musculoskeletal: There are new vertebroplasty changes at T12. Compression deformity of T12 appears grossly unchanged. No acute fractures are seen. CT ABDOMEN PELVIS FINDINGS Hepatobiliary: The liver is mildly enlarged with nodular liver contour compatible with cirrhosis, unchanged from the prior examination. No  focal liver lesions are identified. Gallstones are again seen. There is no biliary ductal dilatation. Pancreas: Unremarkable. No pancreatic ductal dilatation or surrounding inflammatory changes. Spleen: Normal in size without focal abnormality. Adrenals/Urinary Tract: Severe right renal atrophy appears unchanged. The left kidney is within normal limits. The bilateral adrenal glands are within normal limits. There is no hydronephrosis or perinephric fluid. Bladder appears normal. Stomach/Bowel: Stomach is within normal limits. Appendix appears normal. No evidence of bowel wall thickening, distention, or inflammatory changes.  Vascular/Lymphatic: Aortic atherosclerosis. No enlarged abdominal or pelvic lymph nodes. 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 Reproductive: Prostate is unremarkable. Other: There is no evidence for ascites or focal abdominal wall hernia. There is no retroperitoneal hernia. There is no focal body wall hematoma. Musculoskeletal: Degenerative changes affect the spine and hips. Please see below. CT thoracic spine findings There are new vertebroplasty changes at T12. No acute compression fractures are seen. There is no retropulsion of fracture fragments. The bones are osteopenic. There is disc space narrowing and endplate osteophyte formation throughout the thoracic spine compatible with mild degenerative change which is similar to the prior study. There is no significant central canal or neural foraminal stenosis. Perivertebral soft tissues are within limits. There is no central spinal canal hematoma. CT lumbar spine findings There is acute comminuted fracture through L2 vertebral body. There is approximately 25% loss vertebral body height. There is retropulsion of fracture fragments along the superior endplate measuring 3 mm. There is no significant central canal stenosis. There is no neural foraminal stenosis. There is also mild acute compression fracture of the superior endplate of L2 without retropulsion of fracture fragments. There is 5% vertebral body height loss. Spinal alignment is within normal limits. There is mild disc space narrowing at L5-S1 compatible with degenerative change. Disc spaces are otherwise well maintained. There is mild bilateral neural foraminal stenosis, left greater than right at L4-L5 and L5-S1 secondary to facet arthropathy. Paravertebral soft tissues are within normal limits. There is no definite central spinal canal hematoma. IMPRESSION: 1. Acute fracture L3 vertebral body with mild retropulsion of fracture fragments along the superior endplate. Mild central spinal canal stenosis. 2.  Mild acute compression fracture superior endplate of L2. No retropulsion of fracture fragments. 3. No acute additional posttraumatic sequelae in the chest, abdomen or pelvis. 4. Liver cirrhosis. 5. Cholelithiasis. 6. New 6 mm left lower lobe nodule Non-contrast chest CT at 6-12 months is recommended. If the nodule is stable at time of repeat CT, then future CT at 18-24 months (from today's scan) is considered optional for low-risk patients, but is recommended for high-risk patients. This recommendation follows the consensus statement: Guidelines for Management of Incidental Pulmonary Nodules Detected on CT Images: From the Fleischner Society 2017; Radiology 2017; 284:228-243. Electronically Signed   By: Ronney Asters M.D.   On: 06/22/2021 16:25   CT L-SPINE NO CHARGE  Result Date: 06/22/2021 CLINICAL DATA:  Trauma. EXAM: CT CHEST, ABDOMEN, AND PELVIS WITH CONTRAST TECHNIQUE: Multidetector CT imaging of the chest, abdomen and pelvis was performed following the standard protocol during bolus administration of intravenous contrast. Reformats of the thoracic and lumbar spine were performed from CT chest abdomen and pelvis and viewed separately. CONTRAST:  80 mL Omnipaque 300 COMPARISON:  CT chest abdomen and pelvis 06/22/2019. FINDINGS: CT CHEST FINDINGS Cardiovascular: The heart is mildly enlarged, similar to prior. Aorta is normal in size. There is calcified atherosclerotic disease throughout the aorta. There is no pericardial effusion. Mediastinum/Nodes: No enlarged mediastinal, hilar, or axillary lymph  nodes. Thyroid gland, trachea, and esophagus demonstrate no significant findings. Lungs/Pleura: Again seen is pleuroparenchymal scarring in both lung apices. There is minimal dependent atelectasis in the lung bases. There is a 6 mm nodular density in the left lower lobe image 5/112, new from the prior study. No pleural effusion or pneumothorax. Musculoskeletal: There are new vertebroplasty changes at T12.  Compression deformity of T12 appears grossly unchanged. No acute fractures are seen. CT ABDOMEN PELVIS FINDINGS Hepatobiliary: The liver is mildly enlarged with nodular liver contour compatible with cirrhosis, unchanged from the prior examination. No focal liver lesions are identified. Gallstones are again seen. There is no biliary ductal dilatation. Pancreas: Unremarkable. No pancreatic ductal dilatation or surrounding inflammatory changes. Spleen: Normal in size without focal abnormality. Adrenals/Urinary Tract: Severe right renal atrophy appears unchanged. The left kidney is within normal limits. The bilateral adrenal glands are within normal limits. There is no hydronephrosis or perinephric fluid. Bladder appears normal. Stomach/Bowel: Stomach is within normal limits. Appendix appears normal. No evidence of bowel wall thickening, distention, or inflammatory changes. Vascular/Lymphatic: Aortic atherosclerosis. No enlarged abdominal or pelvic lymph nodes. 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 Reproductive: Prostate is unremarkable. Other: There is no evidence for ascites or focal abdominal wall hernia. There is no retroperitoneal hernia. There is no focal body wall hematoma. Musculoskeletal: Degenerative changes affect the spine and hips. Please see below. CT thoracic spine findings There are new vertebroplasty changes at T12. No acute compression fractures are seen. There is no retropulsion of fracture fragments. The bones are osteopenic. There is disc space narrowing and endplate osteophyte formation throughout the thoracic spine compatible with mild degenerative change which is similar to the prior study. There is no significant central canal or neural foraminal stenosis. Perivertebral soft tissues are within limits. There is no central spinal canal hematoma. CT lumbar spine findings There is acute comminuted fracture through L2 vertebral body. There is approximately 25% loss vertebral body height. There is  retropulsion of fracture fragments along the superior endplate measuring 3 mm. There is no significant central canal stenosis. There is no neural foraminal stenosis. There is also mild acute compression fracture of the superior endplate of L2 without retropulsion of fracture fragments. There is 5% vertebral body height loss. Spinal alignment is within normal limits. There is mild disc space narrowing at L5-S1 compatible with degenerative change. Disc spaces are otherwise well maintained. There is mild bilateral neural foraminal stenosis, left greater than right at L4-L5 and L5-S1 secondary to facet arthropathy. Paravertebral soft tissues are within normal limits. There is no definite central spinal canal hematoma. IMPRESSION: 1. Acute fracture L3 vertebral body with mild retropulsion of fracture fragments along the superior endplate. Mild central spinal canal stenosis. 2. Mild acute compression fracture superior endplate of L2. No retropulsion of fracture fragments. 3. No acute additional posttraumatic sequelae in the chest, abdomen or pelvis. 4. Liver cirrhosis. 5. Cholelithiasis. 6. New 6 mm left lower lobe nodule Non-contrast chest CT at 6-12 months is recommended. If the nodule is stable at time of repeat CT, then future CT at 18-24 months (from today's scan) is considered optional for low-risk patients, but is recommended for high-risk patients. This recommendation follows the consensus statement: Guidelines for Management of Incidental Pulmonary Nodules Detected on CT Images: From the Fleischner Society 2017; Radiology 2017; 284:228-243. Electronically Signed   By: Ronney Asters M.D.   On: 06/22/2021 16:25   DG Chest Port 1 View  Result Date: 06/22/2021 CLINICAL DATA:  Pain after trauma EXAM: PORTABLE  CHEST 1 VIEW COMPARISON:  None. FINDINGS: The lung apices were not completely included on the study. Within this limitation, no pneumothorax. The lungs are clear. Probable cardiomegaly. No other acute  abnormalities. IMPRESSION: No abnormality seen on this limited study.  See above for details. Electronically Signed   By: Dorise Bullion III M.D.   On: 06/22/2021 15:54    Microbiology: Recent Results (from the past 240 hour(s))  Resp Panel by RT-PCR (Flu A&B, Covid) Nasopharyngeal Swab     Status: None   Collection Time: 06/22/21  3:56 PM   Specimen: Nasopharyngeal Swab; Nasopharyngeal(NP) swabs in vial transport medium  Result Value Ref Range Status   SARS Coronavirus 2 by RT PCR NEGATIVE NEGATIVE Final    Comment: (NOTE) SARS-CoV-2 target nucleic acids are NOT DETECTED.  The SARS-CoV-2 RNA is generally detectable in upper respiratory specimens during the acute phase of infection. The lowest concentration of SARS-CoV-2 viral copies this assay can detect is 138 copies/mL. A negative result does not preclude SARS-Cov-2 infection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

## 2021-06-28 NOTE — TOC Progression Note (Signed)
Transition of Care (TOC) - Progression Note    Patient Details  Name: Cody Hines. MRN: [id number] Date of Birth: 1946/01/13  Transition of Care [facility]) CM/SW [place], [place] Phone Number: 06/28/2021, 9:56 AM  Clinical Narrative:     CSW was unable to pull up patient approval information in navi portal. CSW called [employer] to check status on insurance authorization. CSW spoke with [facility] who confirmed that patients insurance authorization was approved. Start date is for 9/22-9/26. Next review date is 9/26. Approval number [id number]. [place] ID# [id number]. Patient has SNF bed at [facility].  Expected Discharge Plan: [place] Barriers to Discharge: Continued Medical Work up  Expected Discharge Plan and Services Expected Discharge Plan: B and E arrangements for the past 2 months: Single Family Home Expected Discharge Date: 06/27/21                                     Social Determinants of Health (SDOH) Interventions    Readmission Risk Interventions No flowsheet data found.

## 2021-07-23 ENCOUNTER — Ambulatory Visit: Payer: Medicare Other | Admitting: Psychiatry

## 2021-07-25 ENCOUNTER — Ambulatory Visit: Payer: Medicare Other | Admitting: Psychiatry

## 2021-09-10 ENCOUNTER — Ambulatory Visit (INDEPENDENT_AMBULATORY_CARE_PROVIDER_SITE_OTHER): Payer: Medicare Other | Admitting: Psychiatry

## 2021-09-10 DIAGNOSIS — F411 Generalized anxiety disorder: Secondary | ICD-10-CM
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
## 2021-09-23 ENCOUNTER — Other Ambulatory Visit: Payer: Self-pay | Admitting: Internal Medicine

## 2021-09-23 ENCOUNTER — Telehealth: Payer: Self-pay | Admitting: Psychiatry

## 2021-09-23 ENCOUNTER — Other Ambulatory Visit: Payer: Self-pay | Admitting: Psychiatry

## 2021-09-23 MED ORDER — METHOCARBAMOL 500 MG PO TABS: 500.0000 mg | ORAL_TABLET | Freq: Four times a day (QID) | ORAL | 0 refills | Status: DC | PRN

## 2021-09-23 NOTE — Telephone Encounter (Signed)
Pt LM on VM stating he has requested 2 times refill for Sertraline  100 mg to [facility]. He is home bound.  Will call Pt to schedule virtual apt w/ CC

## 2021-09-23 NOTE — Telephone Encounter (Signed)
Pt has apt 12/20 telephone apt w/CC

## 2021-09-23 NOTE — Telephone Encounter (Signed)
Should we send a 90 day?

## 2021-09-24 ENCOUNTER — Ambulatory Visit (INDEPENDENT_AMBULATORY_CARE_PROVIDER_SITE_OTHER): Payer: Medicare Other | Admitting: Psychiatry

## 2021-09-24 ENCOUNTER — Encounter: Payer: Self-pay | Admitting: Psychiatry

## 2021-09-24 DIAGNOSIS — F331 Major depressive disorder, recurrent, moderate: Secondary | ICD-10-CM | POA: Diagnosis not present

## 2021-09-24 DIAGNOSIS — F411 Generalized anxiety disorder: Secondary | ICD-10-CM | POA: Diagnosis not present

## 2021-09-24 DIAGNOSIS — F5105 Insomnia due to other mental disorder: Secondary | ICD-10-CM

## 2021-09-24 DIAGNOSIS — F6381 Intermittent explosive disorder: Secondary | ICD-10-CM | POA: Diagnosis not present

## 2021-09-24 DIAGNOSIS — F1021 Alcohol dependence, in remission: Secondary | ICD-10-CM

## 2021-09-24 MED ORDER — SERTRALINE HCL 100 MG PO TABS: 100.0000 mg | ORAL_TABLET | Freq: Two times a day (BID) | ORAL | 1 refills | Status: DC

## 2021-09-24 MED ORDER — LORAZEPAM 1 MG PO TABS: 1.0000 mg | ORAL_TABLET | Freq: Every evening | ORAL | 1 refills | Status: DC | PRN

## 2021-09-24 NOTE — Progress Notes (Signed)
Carrington Philmore Jorell Agne. [id number] [birth year] 75 y.o.  Subjective:   Patient ID:  Cody Hines. is a 75 y.o. (DOB 11-14-1945) male.  Chief Complaint:  Chief Complaint  Patient presents with   Follow-up   Anxiety   Depression    Depression        Associated symptoms include headaches.  Associated symptoms include no decreased concentration and no suicidal ideas.  Cody Hines. presents to the office today for follow-up mood problems.  visit 11/21/19.  No med changes at last visit.  Still on sertraline 200 mg daily.   He's continued counseling.  05/21/20 appt with the following noted: Taking lorazepam 3-4 times weekly for sleep. About the same mentally, but physically back pain and can't do much and not much stamina from the fall he had before.  Fell off ladder while fixing downspout at wife's parents house and fx vertebra and wife took him back to care for him.   95% of time trying to get along with him.  Not losing his temper with her generally.    Able to see gkids more generally except for Covid with son.  Pt been vaccinated but still not good enough for son to see the gkids.   verall is improved but sx are not resolved. Plan no med changes  01/21/21 appt noted: Much better.  PT for 2 mos [facility] to deal with falls. Gotten better Still on sertraline 200 and lorazepam prn.  It is working and satisfied with the meds.  Mood is better.  Some memory problems but it is not markedly worse than when here.  Pending neuropsych testing. Pt reports that mood is pretty even keel.  Things don't bother him like they used to.  Wife barely speaks to him since March. Anxiety in large groups of people has to get out.  Pt reports has interrupted sleep. Better sleep when not working. Goes to bed early. Trouble staying asleep.  Busy.  sUsually enough sleep.  Pt reports that appetite is good. Pt reports that energy is poor DT heat. Better than it was.   Concentration is  air.. Suicidal thoughts:  denied by patient.  Likes movies.   Less angry and resentful of others than he used to be. Outbursts are generally resolved. He believes he's handling anger better.  Kids see big difference in the way he handles things. Plan continue to new sertraline 200 mg daily and lorazepam as needed  09/24/2021 appointment with the following noted: Cody Hines mower accident and trapped and broken back and hospitalized a week and 4 weeks of therapy. Can't get comfortable and pain awakens him often.  Just got Robaxin.  Not taking anything for sleep. House bound right now DT injuries. Over all mood is ok under the circumstances.  No SI.  Anxiety manageable. Satisfied with meds and no SE  Therapy with [name] CSW helpful. Wife living with daughter.  Drinks caffeinated sodas all day until 9pm.  Sober  in OCT 2018.   Past Psychiatric Medication Trials: citalopram, Wellbutrin, sertraline 200,  Xanax, lorazepam.  Review of Systems:  Review of Systems  Respiratory:  Negative for cough.   Musculoskeletal:  Positive for back pain, gait problem and neck pain.  Neurological:  Positive for tremors and headaches. Negative for weakness.       Fewer falls.  Psychiatric/Behavioral:  Negative for agitation, behavioral problems, confusion, decreased concentration, dysphoric mood, hallucinations, self-injury, sleep disturbance and suicidal ideas. The patient is nervous/anxious. The  patient is not hyperactive.  Increase in migraine HA.  Medications: I have reviewed the patient's current medications.  Current Outpatient Medications  Medication Sig Dispense Refill   acetaminophen (TYLENOL) 650 MG CR tablet Take 650 mg by mouth every 8 (eight) hours as needed for pain (or back pain or migraines).      aspirin EC 81 MG tablet Take 81 mg by mouth daily.     enalapril (VASOTEC) 20 MG tablet TAKE 1 TABLET BY MOUTH  DAILY FOR BLOOD PRESSURE 120 tablet 2   methocarbamol (ROBAXIN) 500 MG tablet Take 1  tablet (500 mg total) by mouth every 6 (six) hours as needed for muscle spasms. 20 tablet 0   rosuvastatin (CRESTOR) 40 MG tablet Take 40 mg by mouth daily.     TYLENOL 8 HOUR 650 MG CR tablet Take 2 tablets (1,300 mg total) by mouth daily as needed for pain. 30 tablet 0   Cholecalciferol (VITAMIN D-3 PO) Take 1 capsule by mouth daily with breakfast. (Patient not taking: Reported on 09/24/2021)     Cholecalciferol (VITAMIN D3) 250 MCG (10000 UT) capsule Take 10,000 Units by mouth daily. (Patient not taking: Reported on 09/24/2021)     feeding supplement (ENSURE ENLIVE / ENSURE PLUS) LIQD Take 237 mLs by mouth 2 (two) times daily between meals. (Patient not taking: Reported on 09/24/2021) 237 mL 12   Flaxseed, Linseed, (FLAX SEED OIL) 1000 MG CAPS Take 2,000 mg by mouth daily.  (Patient not taking: Reported on 09/24/2021)     LORazepam (ATIVAN) 1 MG tablet Take 1 tablet (1 mg total) by mouth at bedtime as needed for sleep. 30 tablet 1   Magnesium 500 MG TABS Take 500 mg by mouth daily.  (Patient not taking: Reported on 09/24/2021)     Multiple Vitamin (MULTIVITAMIN WITH MINERALS) TABS tablet Take 1 tablet by mouth daily. (Patient not taking: Reported on 09/24/2021) 30 tablet 0   oxyCODONE (ROXICODONE) 5 MG immediate release tablet Take 1 tablet (5 mg total) by mouth every 8 (eight) hours as needed. (Patient not taking: Reported on 09/24/2021) 30 tablet 0   sertraline (ZOLOFT) 100 MG tablet Take 1 tablet (100 mg total) by mouth in the morning and at bedtime. 180 tablet 1   No current facility-administered medications for this visit.    Medication Side Effects: None  Allergies:  Allergies  Allergen Reactions   Asa [Aspirin] Diarrhea, Nausea Only and Other (See Comments)    History of ulcers, also   Atorvastatin Other (See Comments)    Muscle aches   Celebrex [Celecoxib] Other (See Comments)    Headaches    Codeine Nausea And Vomiting   Savella [Milnacipran Hcl] Other (See Comments)     Reaction not recalled   Savella [Milnacipran] Other (See Comments)    Reaction not recalled   Viagra [Sildenafil Citrate] Other (See Comments)    Headaches   Viagra [Sildenafil] Other (See Comments)    Headaches   Penicillins Rash and Other (See Comments)   Penicillins Rash    Past Medical History:  Diagnosis Date   BPH (benign prostatic hyperplasia)    Fibromyalgia    GERD (gastroesophageal reflux disease)    Hyperlipidemia    Hypertension    Hypogonadism male    IBS (irritable bowel syndrome)    OSA (obstructive sleep apnea)     Family History  Problem Relation Age of Onset   Cancer Mother        breast   Cancer Father  lung   Diabetes Father    Heart disease Sister    Arthritis Sister     Social History   Socioeconomic History   Marital status: Divorced    Spouse name: Kennyth Lose   Number of children: Not on file   Years of education: Not on file   Highest education level: Not on file  Occupational History   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  Tobacco Use   Smoking status: Never   Smokeless tobacco: Never  Vaping Use   Vaping Use: Never used  Substance and Sexual Activity   Alcohol use: Not Currently    Comment: Not drinking x 1 month   Drug use: Never   Sexual activity: Not Currently  Other Topics Concern   Not on file  Social History Narrative   ** Merged History Encounter **       Social Determinants of Health   Financial Resource Strain: Not on file  Food Insecurity: Not on file  Transportation Needs: Not on file  Physical Activity: Not on file  Stress: Not on file  Social Connections: Not on file  Intimate Partner Violence: Not on file    Past Medical History, Surgical history, Social history, and Family history were reviewed and updated as appropriate.   Please see review of systems for further details on the patient's review from today.   Objective:   Physical Exam:  There were no vitals taken for this visit.  Physical  Exam Neurological:     Mental Status: He is alert and oriented to person, place, and time.     Cranial Nerves: No dysarthria.  Psychiatric:        Attention and Perception: Attention and perception normal.        Mood and Affect: Mood is anxious and depressed.        Speech: Speech normal. Speech is not slurred.        Behavior: Behavior is cooperative.        Thought Content: Thought content normal. Thought content is not paranoid or delusional. Thought content does not include homicidal or suicidal ideation. Thought content does not include homicidal or suicidal plan.        Cognition and Memory: Cognition and memory normal.        Judgment: Judgment normal.     Comments: Insight intact Mild anxiety and dysphoria.    Lab Review:     Component Value Date/Time   NA 136 06/25/2021 0341   K 3.6 06/25/2021 0341   CL 100 06/25/2021 0341   CO2 27 06/25/2021 0341   GLUCOSE 110 (H) 06/25/2021 0341   BUN 18 06/25/2021 0341   CREATININE 1.44 (H) 06/25/2021 0341   CREATININE 1.42 (H) 01/09/2021 0959   CALCIUM 9.4 06/25/2021 0341   PROT 6.4 (L) 06/22/2021 1519   ALBUMIN 3.6 06/22/2021 1519   AST 46 (H) 06/22/2021 1519   ALT 26 06/22/2021 1519   ALKPHOS 79 06/22/2021 1519   BILITOT 1.7 (H) 06/22/2021 1519   GFRNONAA 52 (L) 06/25/2021 0341   GFRNONAA 49 (L) 01/09/2021 0959   GFRAA 57 (L) 01/09/2021 0959       Component Value Date/Time   WBC 7.2 06/23/2021 0410   RBC 4.43 06/23/2021 0410   HGB 13.9 06/23/2021 0410   HCT 40.7 06/23/2021 0410   PLT 100 (L) 06/23/2021 0410   MCV 91.9 06/23/2021 0410   MCH 31.4 06/23/2021 0410   MCHC 34.2 06/23/2021 0410   RDW 13.6 06/23/2021 0410   LYMPHSABS  1,995 01/09/2021 0959   MONOABS 0.3 06/22/2019 2128   EOSABS 110 01/09/2021 0959   BASOSABS 29 01/09/2021 0959    No results found for: POCLITH, LITHIUM   No results found for: PHENYTOIN, PHENOBARB, VALPROATE, CBMZ   .res Assessment: Plan:    Kamdyn was seen today for follow-up,  anxiety and depression.  Diagnoses and all orders for this visit:  Major depressive disorder, recurrent episode, moderate (HCC) -     sertraline (ZOLOFT) 100 MG tablet; Take 1 tablet (100 mg total) by mouth in the morning and at bedtime.  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  Generalized anxiety disorder -     sertraline (ZOLOFT) 100 MG tablet; Take 1 tablet (100 mg total) by mouth in the morning and at bedtime.  Insomnia due to mental condition -     LORazepam (ATIVAN) 1 MG tablet; Take 1 tablet (1 mg total) by mouth at bedtime as needed for sleep.  Alcohol dependence, in remission ([place])   Greater than 50% of 30 min non face to face time with patient was spent on counseling and coordination of care. We discussed his chronic depression and anxiety and irritability, anger.  He's markedly better but has residual symptoms.  Most of mood problem is situational.   Benefit from meds and therapy. No change in therapy indicated.  Needs to continue the meds bc of a high relapse risk.  Supportive therapy re: dealing with stress of separation. Doesn't look like things will change.  Option potentiate with something but defer.  Not likely to help bc it appears mostly situational.  Disc caffeine and sleep.  Takes it up to 9 pm.  Rec avoid late caffeine.  We discussed the short-term risks associated with benzodiazepines including sedation and increased fall risk among others.  Discussed long-term side effect risk including dependence, potential withdrawal symptoms, and the potential eventual dose-related risk of dementia.  No evidence of abuse.Take LED lorazepam.  Used less since here.  Continue sertraline 200.  Disc sobriety maintenance.   Benefit from therapy.  No med changes indicated.  FU 6 mos.  Lynder Parents, MD, DFAPA   Please see After Visit Summary for patient specific  instructions.  Future Appointments  Date Time Provider [place]  09/26/2021 11:30 AM [name], MD [facility] None  11/14/2021  9:30 AM Ward Givens, NP [facility] None  01/09/2022 10:00 AM [name], MD [facility] None    No orders of the defined types were placed in this encounter.     -------------------------------

## 2021-09-26 ENCOUNTER — Encounter: Payer: Self-pay | Admitting: Internal Medicine

## 2021-09-26 ENCOUNTER — Other Ambulatory Visit: Payer: Self-pay

## 2021-09-26 ENCOUNTER — Ambulatory Visit: Payer: Medicare Other | Admitting: Adult Health

## 2021-09-26 ENCOUNTER — Ambulatory Visit (INDEPENDENT_AMBULATORY_CARE_PROVIDER_SITE_OTHER): Payer: Medicare Other | Admitting: Internal Medicine

## 2021-09-26 VITALS — BP 136/80 | HR 70 | Temp 98.2°F | Resp 16 | Ht 73.0 in | Wt 205.8 lb

## 2021-09-26 DIAGNOSIS — E559 Vitamin D deficiency, unspecified: Secondary | ICD-10-CM | POA: Diagnosis not present

## 2021-09-26 DIAGNOSIS — E782 Mixed hyperlipidemia: Secondary | ICD-10-CM

## 2021-09-26 DIAGNOSIS — Z79899 Other long term (current) drug therapy: Secondary | ICD-10-CM

## 2021-09-26 DIAGNOSIS — I700 Atherosclerosis of aorta: Secondary | ICD-10-CM

## 2021-09-26 DIAGNOSIS — I10 Essential (primary) hypertension: Secondary | ICD-10-CM

## 2021-09-26 DIAGNOSIS — R7309 Other abnormal glucose: Secondary | ICD-10-CM | POA: Diagnosis not present

## 2021-09-26 DIAGNOSIS — N1831 Chronic kidney disease, stage 3a: Secondary | ICD-10-CM

## 2021-09-26 NOTE — Progress Notes (Signed)
Date Time Provider Department  09/26/2021 11:30 AM [name], MD [facility]  11/14/2021  9:30 AM Ward Givens, NP [facility]  01/09/2022            CPE  10:00 AM [name], MD [facility]  03/25/2022  2:00 PM Cottle, Billey Co., MD [facility]  20 MG tablet, TAKE 1 TABLET  DAILY    rosuvastatin (CRESTOR) 40 MG tablet, Take 40 mg by mouth daily.   acetaminophen (TYLENOL) 650 MG CR tablet, Take 650 mg by mouth every 8 (eight) hours as needed for pain (or back pain or migraines).    aspirin EC 81 MG tablet, Take 81 mg by mouth daily.   oxyCODONE (ROXICODONE) 5 MG immediate release tablet, Take 1 tablet (5 mg total) by mouth every 8 (eight) hours as needed. (Patient not taking: Reported on 09/24/2021)   TYLENOL 8 HOUR 650 MG CR tablet, Take 2 tablets (1,300 mg total) by mouth daily as needed for pain.   Current Outpatient Medications (Other):    Cholecalciferol (VITAMIN D-3 PO), Take 1 capsule by mouth daily with breakfast. (Patient not taking: Reported on 09/24/2021)   Cholecalciferol (VITAMIN D3) 250 MCG (10000 UT) capsule, Take 10,000 Units by mouth daily. (Patient not taking: Reported on 09/24/2021)   feeding supplement (ENSURE ENLIVE / ENSURE PLUS) LIQD, Take 237 mLs by mouth 2 (two) times daily between meals. (Patient not taking: Reported on 09/24/2021)   Flaxseed, Linseed, (FLAX SEED OIL) 1000 MG CAPS, Take 2,000 mg by mouth daily.  (Patient not taking: Reported on 09/24/2021)   LORazepam (ATIVAN) 1 MG tablet, Take 1 tablet (1 mg total) by mouth at bedtime as needed for sleep.   Magnesium 500 MG TABS, Take 500 mg by mouth daily.  (Patient not taking: Reported on 09/24/2021)   methocarbamol (ROBAXIN) 500 MG tablet, Take 1 tablet (500 mg total) by mouth every 6 (six) hours as needed for muscle spasms.   Multiple Vitamin (MULTIVITAMIN WITH MINERALS) TABS tablet, Take 1 tablet by mouth daily. (Patient not taking: Reported on 09/24/2021)   sertraline (ZOLOFT) 100 MG tablet, Take 1  tablet (100 mg total) by mouth in the morning and at bedtime.  Problem list He has Essential hypertension; GERD (gastroesophageal reflux disease); IBS (irritable bowel syndrome); OSA on CPAP; Fibromyalgia; BPH (benign prostatic hyperplasia); Testosterone deficiency; Medication management; Vitamin D deficiency; Encounter for Medicare annual wellness exam; Benign essential tremor; Chronic lumbar pain; Major depressive disorder, recurrent episode, moderate (HCC); CKD (chronic kidney disease) stage 3, GFR 30-59 ml/min ([place]); Hyperlipidemia, mixed; Personal history of skin cancer; At moderate risk for fall; Compression fracture of T12 vertebra ([place]); Aortic atherosclerosis ([place]) by CT Scan on 06/22/2019; Small right kidney; HTN (hypertension); Closed L2 vertebral fracture ([place]); Closed L3 vertebral fracture ([place]); Contact with powered lawnmower as cause of accidental injury at home as place of occurrence; CKD (chronic kidney disease) stage 3, GFR 30-59 ml/min (HCC); OSA on CPAP; MCI (mild cognitive impairment); Pulmonary nodule; Fall; and Malnutrition of moderate degree on their problem list.   Observations/Objective:  BP 136/80    Pulse 70    Temp 98.2 F (36.8 C)    Resp 16    Ht 6\' 1"  (1.854 m)    Wt 205 lb 12.8 oz (93.4 kg)    SpO2 97%    BMI 27.15 kg/m   HEENT - WNL. Neck - supple.  Chest - Clear equal BS. Cor - Nl HS. RRR w/o sig MGR. PP 1(+). No edema.  MS- FROM w/o deformities.  Gait Nl. Neuro -  Nl w/o focal abnormalities.   Assessment and Plan:  1. Essential hypertension  - CBC with Differential/Platelet - COMPLETE METABOLIC PANEL WITH GFR - Magnesium - TSH  2. Hyperlipidemia, mixed - Lipid panel - TSH  3. Abnormal glucose  - Hemoglobin A1c - Insulin, random  4. Vitamin D deficiency  - VITAMIN D 25 Hydroxy (Vit-D Deficiency, Fractures)  5. Stage 3a chronic kidney disease (HCC)  - COMPLETE METABOLIC PANEL WITH GFR  6. Aortic atherosclerosis ([place]) by CT Scan on  06/22/2019  - Lipid panel  7. Medication management  - CBC with Differential/Platelet - COMPLETE METABOLIC PANEL WITH GFR - Magnesium - Lipid panel - TSH - Hemoglobin A1c - Insulin, random - VITAMIN D 25 Hydroxy  Follow Up Instructions:        I discussed the assessment and treatment plan with the patient. The patient was provided an opportunity to ask questions and all were answered. The patient agreed with the plan and demonstrated an understanding of the instructions.       The patient was advised to call back or seek an in-person evaluation if the symptoms worsen or if the condition fails to improve as anticipated.    Kirtland Bouchard, MD

## 2021-09-26 NOTE — Patient Instructions (Signed)

## 2021-09-27 LAB — CBC WITH DIFFERENTIAL/PLATELET
Absolute Monocytes: 410 cells/uL (ref 200–950)
Basophils Absolute: 22 cells/uL (ref 0–200)
Basophils Relative: 0.4 %
Eosinophils Absolute: 167 cells/uL (ref 15–500)
Eosinophils Relative: 3.1 %
HCT: 40.7 % (ref 38.5–50.0)
Hemoglobin: 13.7 g/dL (ref 13.2–17.1)
Lymphs Abs: 2057 cells/uL (ref 850–3900)
MCH: 31.1 pg (ref 27.0–33.0)
MCHC: 33.7 g/dL (ref 32.0–36.0)
MCV: 92.5 fL (ref 80.0–100.0)
MPV: 11.5 fL (ref 7.5–12.5)
Monocytes Relative: 7.6 %
Neutro Abs: 2743 cells/uL (ref 1500–7800)
Neutrophils Relative %: 50.8 %
Platelets: 138 10*3/uL — ABNORMAL LOW (ref 140–400)
RBC: 4.4 10*6/uL (ref 4.20–5.80)
RDW: 14.2 % (ref 11.0–15.0)
Total Lymphocyte: 38.1 %
WBC: 5.4 10*3/uL (ref 3.8–10.8)

## 2021-09-27 LAB — COMPLETE METABOLIC PANEL WITH GFR
AG Ratio: 1.6 (calc) (ref 1.0–2.5)
ALT: 21 U/L (ref 9–46)
AST: 42 U/L — ABNORMAL HIGH (ref 10–35)
Albumin: 4.3 g/dL (ref 3.6–5.1)
Alkaline phosphatase (APISO): 88 U/L (ref 35–144)
BUN/Creatinine Ratio: 7 (calc) (ref 6–22)
BUN: 11 mg/dL (ref 7–25)
CO2: 23 mmol/L (ref 20–32)
Calcium: 9.7 mg/dL (ref 8.6–10.3)
Chloride: 107 mmol/L (ref 98–110)
Creat: 1.48 mg/dL — ABNORMAL HIGH (ref 0.70–1.28)
Globulin: 2.7 g/dL (calc) (ref 1.9–3.7)
Glucose, Bld: 105 mg/dL — ABNORMAL HIGH (ref 65–99)
Potassium: 4.1 mmol/L (ref 3.5–5.3)
Sodium: 140 mmol/L (ref 135–146)
Total Bilirubin: 1.1 mg/dL (ref 0.2–1.2)
Total Protein: 7 g/dL (ref 6.1–8.1)
eGFR: 50 mL/min/{1.73_m2} — ABNORMAL LOW (ref >=60)

## 2021-09-27 LAB — LIPID PANEL
Cholesterol: 129 mg/dL (ref <200)
HDL: 58 mg/dL (ref >=40)
LDL Cholesterol (Calc): 55 mg/dL (calc)
Non-HDL Cholesterol (Calc): 71 mg/dL (calc) (ref <130)
Total CHOL/HDL Ratio: 2.2 (calc) (ref <5.0)
Triglycerides: 84 mg/dL (ref <150)

## 2021-09-27 LAB — MAGNESIUM: Magnesium: 2.3 mg/dL (ref 1.5–2.5)

## 2021-09-27 LAB — HEMOGLOBIN A1C
Hgb A1c MFr Bld: 5.4 % of total Hgb (ref <5.7)
Mean Plasma Glucose: 108 mg/dL
eAG (mmol/L): 6 mmol/L

## 2021-09-27 LAB — INSULIN, RANDOM: Insulin: 26.1 u[IU]/mL — ABNORMAL HIGH

## 2021-09-27 LAB — TSH: TSH: 1.88 mIU/L (ref 0.40–4.50)

## 2021-09-27 LAB — VITAMIN D 25 HYDROXY (VIT D DEFICIENCY, FRACTURES): Vit D, 25-Hydroxy: 50 ng/mL (ref 30–100)

## 2021-09-29 NOTE — Progress Notes (Signed)
============================================================ °-   Test results slightly outside the reference range are not unusual. If there is anything important, I will review this with you,  otherwise it is considered normal test values.  If you have further questions,  please do not hesitate to contact me at the office or via My Chart.  ============================================================ ============================================================  -  Kidney functions  still in Stage 3 & stable  ============================================================ ============================================================  -  Total Chol = 129    &    LDL Chol = 55    - Both    Excellent   - Very low risk for Heart Attack  / Stroke ============================================================ ============================================================  -  A1c = 5.4% - Normal - No Diabetes  -    Great ! ============================================================ ============================================================  -  Vitamin D = 50    a little low   - Vitamin D goal is between 70-100.   - Please make sure that you are taking your Vitamin D as directed.   - It is very important as a natural anti-inflammatory and helping the  immune system protect against viral infections, like the Covid-19    helping hair, skin, and nails, as well as reducing stroke and  heart attack risk.   - It helps your bones and helps with mood.  - It also decreases numerous cancer risks so please  take it as directed.   - Low Vit D is associated with a 200-300% higher risk for  CANCER   and 200-300% higher risk for HEART   ATTACK  &  STROKE.    - It is also associated with higher death rate at younger ages,   autoimmune diseases like Rheumatoid arthritis, Lupus,  Multiple Sclerosis.     - Also many other serious conditions, like depression, Alzheimer's  Dementia, infertility,  muscle aches, fatigue, fibromyalgia   - just to name a few. ============================================================ ============================================================  - All Else - CBC - Kidneys - Electrolytes - Liver - Magnesium & Thyroid    - all  Normal / OK  ============================================================ ============================================================

## 2021-10-03 ENCOUNTER — Ambulatory Visit: Payer: Medicare Other | Admitting: Adult Health

## 2021-10-04 ENCOUNTER — Ambulatory Visit (INDEPENDENT_AMBULATORY_CARE_PROVIDER_SITE_OTHER): Payer: Medicare Other | Admitting: Psychiatry

## 2021-10-04 DIAGNOSIS — F331 Major depressive disorder, recurrent, moderate: Secondary | ICD-10-CM | POA: Diagnosis not present

## 2021-10-04 NOTE — Progress Notes (Signed)
[facility] Counselor/Therapist Progress Note  Patient ID: Cody Philmore Shalamar Crays., MRN: [id number],    Date: 10/04/2021  Time Spent: 45 minutes   Virtual Visit via Telehealth Note: Telephone only, patient not able to do Video visit Connected with patient by a telemedicine/telehealth application, with their informed consent, and verified patient privacy and that I am speaking with the correct person using two identifiers. I discussed the limitations, risks, security and privacy concerns of performing psychotherapy and the availability of in person appointments. I also discussed with the patient that there may be a patient responsible charge related to this service. The patient expressed understanding and agreed to proceed. I discussed the treatment planning with the patient. The patient was provided an opportunity to ask questions and all were answered. The patient agreed with the plan and demonstrated an understanding of the instructions. The patient was advised to call  our office if  symptoms worsen or feel they are in a crisis state and need immediate contact.   Therapist Location: [facility] Patient Location: home  Treatment Type: Individual Therapy  Reported Symptoms: anxiety, depression (main symptom)  Mental Status Exam:  Appearance:   N/a  Telehealth      Behavior:  Appropriate, Sharing, and Motivated  Motor:  Normal  Speech/Language:   Normal Rate  Affect:  N/a  telehealth  Mood:  anxious and some depression  Thought process:  Some tangentiality  Thought content:    Some obsessiveness and overthinking  Sensory/Perceptual disturbances:    WNL  Orientation:  oriented to person, place, time/date, situation, day of week, month of year, year, and stated date of Dec. 30, 2022  Attention:  Fair  Concentration:  Fair  Memory:  Some "recent memory problems sometimes"; states his Dr is aware  Fund of knowledge:   Good and Fair  Insight:    Fair  Judgment:    Good and Fair  Impulse Control:  Good   Risk Assessment: Danger to Self:  No Self-injurious Behavior: No Danger to Others: No Duty to Warn:no Physical Aggression / Violence:No  Access to Firearms a concern: No  Gang Involvement:No   Subjective:  Patient today reporting depression (main symptom) and anxiety. "I think about the lawnmower accident and that depresses me and makes me anxious."  Difficult not to think about serveral "negatives in my past" including his marital stress and breakup with wife. Discouraged with some physical issues from his lawnmower accident, other health concerns, and personal/family issues.  A real positive for him is that he has some friends that do check in on him and he reaches out to them, as I been concerned about his isolating at times especially since he has not been able to drive for physical reasons most recently.  He talked very openly and almost nonstop today which seems to be a reflection of him not having a lot of people to talk with and some loneliness at times.  He is to see the doctor soon about when he can return to driving and feels that that will help him be able to get out of the house more.  He seemed to feel he can drive now but I have encouraged him to wait until he gets doctor clearance to drive.  Also encouraged his staying in touch with his adult son and daughter and other friends nearby that care about him and have befriended him especially in recent years.  Interventions: Solution-Oriented/Positive Psychology and Ego-Supportive  Treatment Goal Plan:  Patient is not signing tx goals on computer screen due to [place]. Treatment Goals: Goals remain on tx plan as patient works on strategies to achieve his goals.  Progress is documented each session in the "Progress" section of Plan. Long term goal: Develop healthy cognitive patterns and beliefs about self and the world that lead to alleviation and help prevent relapse of depression. Short term  goal: Identify and replace depressive thinking that leads to depressive feelings and actions.  He will replace the depressive thinking with more positive, hopeful thoughts that can lead to more positive and hopeful feelings. Strategy: Patient to more consistently monitor his thoughts and catch the depressive/negative thoughts, trying to change them to positive and more reality-based thought patterns. Look at his negative thoughts and weigh them against the evidence.    Diagnosis:   ICD-10-CM   1. Major depressive disorder, recurrent episode, moderate ([place])  F33.1      Plan:  Patient today showing good motivation and participated well in session as we worked on some personal, health, and family situations that contribute to his anxiety and depression.  Having some thoughts "still about my lawnmower accident but not as much now".  Still involved in physical therapy and is waiting to hear from his doctor as to when he is safe to drive.  Not driving has led him to spend more time at home, oftentimes alone, which I encouraged him to still reach out to friends.  He has had several friends reach out to him including spending some time with him and sharing meals.  Patient states he does continue to improve after several weeks of PT and is being encouraged to walk more.  Hoping that his next appointment can be here in our office in person instead of telehealth but was appreciative to have telehealth available today.  He did seem to perk up more the longer he talked and was definitely more grounded and seemed to feel heard by session end.  Encouraged him in his practice of positive behaviors including: Reaching out to family to initiate interactions especially with his adult children and grandchildren and sister, remain in contact with other people who are supportive of him including longtime friends, use his cane when needed for stability and walking, consistent encouraging self talk as discussed with specific  examples in sessions, getting outside some daily as he is able to do so carefully and with doctor approval, stay focused on the present and what he can control versus cannot control, focus on enjoying situations more currently rather than going back and focusing on negativity from the past, remain on his prescribed medication, taking advantage of opportunities he has to be with other people through his church/family/community, and to feel good about the strength he shows working with goal-directed behaviors and trying to let go of negativity from the past and look for more positives as he moves forward in a direction that supports improved emotional health and overall wellbeing.   Goal review and progress/challenges noted with patient.  Next appointment within 3 weeks.  This record has been created using [employer].  Chart creation errors have been sought, but may not always have been located and corrected.  Such creation errors do not reflect on the standard of medical care provided.   Shanon Ace, LCSW

## 2021-10-29 ENCOUNTER — Other Ambulatory Visit: Payer: Self-pay

## 2021-10-29 ENCOUNTER — Ambulatory Visit (INDEPENDENT_AMBULATORY_CARE_PROVIDER_SITE_OTHER): Payer: Medicare Other | Admitting: Psychiatry

## 2021-10-29 DIAGNOSIS — F331 Major depressive disorder, recurrent, moderate: Secondary | ICD-10-CM

## 2021-10-29 NOTE — Progress Notes (Signed)
[facility] Counselor/Therapist Progress Note  Patient ID: Cody Hines., MRN: [id number],    Date: 10/29/2021  Time Spent: 50 minutes   Treatment Type: Individual Therapy  Reported Symptoms: anxiety, depression, broke 2 vertebra in back more recently and still healing  Mental Status Exam:  Appearance:   Casual     Behavior:  Appropriate, Sharing, and Motivated  Motor:  Slower and not quite as steady in walking  Speech/Language:   Clear and Coherent  Affect:  Depressed and anxious  Mood:  anxious and depressed  Thought process:  Some tangentiality  Thought content:    Some obsessiveness and overthinking  Sensory/Perceptual disturbances:    WNL  Orientation:  oriented to person, place, time/date, situation, day of week, month of year, year, and stated date of Jan. 24, 2023  Attention:  Fair  Concentration:  Fair  Memory:  Some forgetting  Fund of knowledge:   Fair  Insight:    Fair  Judgment:   Good and Fair  Impulse Control:  Good   Risk Assessment: Danger to Self:  No Self-injurious Behavior: No Danger to Others: No Duty to Warn:no Physical Aggression / Violence:No  Access to Firearms a concern: No  Gang Involvement:No   Subjective: Patient in today reporting depression and anxiety. Been out of his back brace 2 weeks and plans to try and get out more as he is able so as to not be as isolated. Adult children check on him. Attended a Bible study recently with a group of people enjoyed that, although having some pain. Still strained relations with separated wife. Has contact with sister and a few friends. Has met his minister and seems to feel that is a good resource. Loneliness has been more of a problem recently but states he is getting out a little more and can increase that soon. Shared and talked about some of his discouraging feelings regarding his injuries from an accident with lawnmower several wks ago, and also his concerns in aging. Sees that he  isn't able to do some things at home as well, like cleaning and other chores which now he has the help from agency [employer]. Discussed his being more careful as he walks around so as to not be as likely to fall. Discouraged with some of his aging and having to rely on others, but also able to recognize some progress he is making, and we emphasized his progress which seemed to help patient also feel more encouraged. Has a few good friends that continue to check in on him.Encouraged him again to stay in touch with family and friends, his church, trying not to isolate himself.   Interventions: Solution-Oriented/Positive Psychology and Ego-Supportive  Treatment Goal Plan:  Patient is not signing tx goals on computer screen due to [place]. Treatment Goals: Goals remain on tx plan as patient works on strategies to achieve his goals.  Progress is documented each session in the "Progress" section of Plan. Long term goal: Develop healthy cognitive patterns and beliefs about self and the world that lead to alleviation and help prevent relapse of depression. Short term goal: Identify and replace depressive thinking that leads to depressive feelings and actions.  He will replace the depressive thinking with more positive, hopeful thoughts that can lead to more positive and hopeful feelings. Strategy: Patient to more consistently monitor his thoughts and catch the depressive/negative thoughts, trying to change them to positive and more reality-based thought patterns. Look at his negative thoughts  and weigh them against the evidence.   Diagnosis:   ICD-10-CM   1. Major depressive disorder, recurrent episode, moderate ([facility])  F33.1      Plan: Patient today showing motivation and good participation as he processed some of his fears and anxieties, considering what all he has been through since his lawnmower accident, hospitalization and rehab, and also his longtime separation from his wife.  Thankful for his  support system which includes his 2 adult children, his sister, his church, and others that have been friends with him for years and live close by.  Encouraged him to be sure and follow doctor's recommendations about his moving around, driving, and doing other tasks.  Remaining on his medication as prescribed.  Did encourage him several times on not isolating at home and being around other people as much as he is able and he agrees to this.  Encouraged him in his practice of more positive behaviors including: Recognizing his progress more often, for every negative thought create 2 positive thoughts, reaching out to family to initiate interactions especially with his adult children and grandchildren and sister, remain in contact with other people who are supportive of him including longtime friends, use his cane when needed for stability and walking, consistent encouraging self talk as discussed with specific examples in session, getting outside some daily as he is able to do so carefully and with doctor approval, stay focused on the present and what he can control versus cannot control, focus on enjoying situations more currently rather than going back and focusing on negativity from the past, remain on his prescribed medication, take advantage of opportunities he has to be with other people through his church/family/community, and feel good about the strength he shows working with goal-directed behaviors and trying to let go of negativity from the past while looking for more positives as he moves forward in a direction that supports improved emotional health.  Goal review and progress/challenges noted with patient.  Next appointment within 3 to 4 weeks.  Can call before then if needed.  This record has been created using [employer].  Chart creation errors have been sought, but may not always have been located and corrected.  Such creation errors do not reflect on the standard of medical care  provided.  Shanon Ace, LCSW

## 2021-11-14 ENCOUNTER — Ambulatory Visit: Payer: Medicare Other | Admitting: Adult Health

## 2021-11-14 ENCOUNTER — Encounter: Payer: Self-pay | Admitting: Adult Health

## 2021-11-14 VITALS — BP 122/82 | HR 74 | Ht 73.0 in | Wt 201.0 lb

## 2021-11-14 DIAGNOSIS — Z9989 Dependence on other enabling machines and devices: Secondary | ICD-10-CM | POA: Diagnosis not present

## 2021-11-14 DIAGNOSIS — G4733 Obstructive sleep apnea (adult) (pediatric): Secondary | ICD-10-CM

## 2021-11-14 DIAGNOSIS — G3184 Mild cognitive impairment, so stated: Secondary | ICD-10-CM | POA: Diagnosis not present

## 2021-11-14 NOTE — Progress Notes (Signed)
PATIENT: Cody Hines Cody Hines. DOB: [birth year]  REASON FOR VISIT: follow up HISTORY FROM: patient PRIMARY NEUROLOGIST: Dr. Brett Fairy  HISTORY OF PRESENT ILLNESS: Today 11/14/21:  Cody Hines is a 76 year old male with a history of mild cognitive disturbance and obstructive sleep apnea on CPAP.  He returns today for follow-up.  The patient states in September he flipped his lawn more on top of him.  Fractured several vertebrae in his spine.  Spent 6 weeks in rehab.  He states during this time he has not been using the CPAP consistently.  Although he reports that he is trying to improve.  Feels that his memory has remained fairly stable.  Continues to have trouble remembering where he places things.  He currently lives at home.  But he does have [employer] that help him.  His son in [place] is involved in his care.   03/28/21: Cody Hines is a 76 year old male with a history of obstructive sleep apnea on CPAP and memory disturbance.  He returns today for follow-up.  The patient had MRI of the brain that was relatively unremarkable.  Also had an MRI of the thoracic spine that showed an old compression fracture.  Overall the patient feels that his memory has remained relatively stable.  He states that he may forget where he leaves things.  Otherwise he lives at home alone.  He is able to complete all ADLs independently.  He operates a Teacher, music without difficulty.  He does report that he had a hearing test and will need hearing aids.  He feels that his hearing affects his ability to understand questions.  He returns today for evaluation.      REVIEW OF SYSTEMS: Out of a complete 14 system review of symptoms, the patient complains only of the following symptoms, and all other reviewed systems are negative.   ESS 13  ALLERGIES: Allergies  Allergen Reactions   Asa [Aspirin] Diarrhea, Nausea Only and Other (See Comments)    History of ulcers, also   Atorvastatin Other (See  Comments)    Muscle aches   Celebrex [Celecoxib] Other (See Comments)    Headaches    Codeine Nausea And Vomiting   Savella [Milnacipran Hcl] Other (See Comments)    Reaction not recalled   Savella [Milnacipran] Other (See Comments)    Reaction not recalled   Viagra [Sildenafil Citrate] Other (See Comments)    Headaches   Viagra [Sildenafil] Other (See Comments)    Headaches   Penicillins Rash and Other (See Comments)   Penicillins Rash    HOME MEDICATIONS: Outpatient Medications Prior to Visit  Medication Sig Dispense Refill   acetaminophen (TYLENOL) 650 MG CR tablet Take 650 mg by mouth every 8 (eight) hours as needed for pain (or back pain or migraines).      aspirin EC 81 MG tablet Take 81 mg by mouth daily.     enalapril (VASOTEC) 20 MG tablet TAKE 1 TABLET BY MOUTH  DAILY FOR BLOOD PRESSURE 120 tablet 2   LORazepam (ATIVAN) 1 MG tablet Take 1 tablet (1 mg total) by mouth at bedtime as needed for sleep. 30 tablet 1   methocarbamol (ROBAXIN) 500 MG tablet Take 1 tablet (500 mg total) by mouth every 6 (six) hours as needed for muscle spasms. 20 tablet 0   Multiple Vitamin (MULTIVITAMIN) capsule Take 1 capsule by mouth daily.     rosuvastatin (CRESTOR) 40 MG tablet Take 40 mg by mouth daily.     sertraline (  ZOLOFT) 100 MG tablet Take 1 tablet (100 mg total) by mouth in the morning and at bedtime. 180 tablet 1   TYLENOL 8 HOUR 650 MG CR tablet Take 2 tablets (1,300 mg total) by mouth daily as needed for pain. 30 tablet 0   No facility-administered medications prior to visit.    PAST MEDICAL HISTORY: Past Medical History:  Diagnosis Date   BPH (benign prostatic hyperplasia)    Fibromyalgia    GERD (gastroesophageal reflux disease)    Hyperlipidemia    Hypertension    Hypogonadism male    IBS (irritable bowel syndrome)    OSA (obstructive sleep apnea)     PAST SURGICAL HISTORY: Past Surgical History:  Procedure Laterality Date    nasal smr np3  1985   CATARACT  EXTRACTION, BILATERAL Bilateral 2021   Dr. Katy Fitch, L 5/27, R 3/25   KNEE ARTHROSCOPY Left 1999   SKIN CANCER EXCISION  2020   nose   SPINE SURGERY  2007   L5 S 1 Disk   SPINE SURGERY  09/2019   Compression fracture stabilization by Dr. 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  Tobacco Use   Smoking status: Never   Smokeless tobacco: Never  Vaping Use   Vaping Use: Never used  Substance and Sexual Activity   Alcohol use: Not Currently    Comment: Not drinking x 1 month   Drug use: Never   Sexual activity: Not Currently  Other Topics Concern   Not on file  Social History Narrative   ** Merged History Encounter **       Social Determinants of Health   Financial Resource Strain: Not on file  Food Insecurity: Not on file  Transportation Needs: Not on file  Physical Activity: Not on file  Stress: Not on file  Social Connections: Not on file  Intimate Partner Violence: Not on file      PHYSICAL EXAM  Vitals:   11/14/21 0901  BP: 122/82  Pulse: 74  SpO2: 96%  Weight: 201 lb (91.2 kg)  Height: 6\' 1"  (1.854 m)   Body mass index is 26.52 kg/m.  Montreal Cognitive Assessment  11/14/2021 03/28/2021 11/22/2020  Visuospatial/ Executive (0/5) 4 4 4   Naming (0/3) 3 3 3   Attention: Read list of digits (0/2) 2 2 2   Attention: Read list of letters (0/1) 1 1 1   Attention: Serial 7 subtraction starting at 100 (0/3) 3 3 3   Language: Repeat phrase (0/2) 0 1 2  Language : Fluency (0/1) 1 1 1   Abstraction (0/2) 2 2 2   Delayed Recall  (0/5) 4 1 3   Orientation (0/6) 6 6 6   Total 26 24 27      Generalized: Well developed, in no acute distress  Chest: Lungs clear to auscultation bilaterally  Neurological examination  Mentation: Alert oriented to time, place, history taking. Follows all commands speech and  language fluent Cranial nerve II-XII: Extraocular movements were full, visual field were full on confrontational test Head turning and shoulder shrug  were normal and symmetric. Motor: The motor testing reveals 5 over 5 strength of all 4 extremities. Good symmetric motor tone is noted throughout.  Rapid alternating movement in the upper extremities only mildly impaired.  Sensory: Sensory testing is intact to soft touch on all 4 extremities. No evidence of extinction is noted.  Gait and station: Stands without assistance.  Good stride and arm swing.  3 steps with turns   DIAGNOSTIC DATA (LABS, IMAGING, TESTING) - I reviewed patient records, labs, notes, testing and imaging myself where available.  Lab Results  Component Value Date   WBC 5.4 09/26/2021   HGB 13.7 09/26/2021   HCT 40.7 09/26/2021   MCV 92.5 09/26/2021   PLT 138 (L) 09/26/2021      Component Value Date/Time   NA 140 09/26/2021 1240   K 4.1 09/26/2021 1240   CL 107 09/26/2021 1240   CO2 23 09/26/2021 1240   GLUCOSE 105 (H) 09/26/2021 1240   BUN 11 09/26/2021 1240   CREATININE 1.48 (H) 09/26/2021 1240   CALCIUM 9.7 09/26/2021 1240   PROT 7.0 09/26/2021 1240   ALBUMIN 3.6 06/22/2021 1519   AST 42 (H) 09/26/2021 1240   ALT 21 09/26/2021 1240   ALKPHOS 79 06/22/2021 1519   BILITOT 1.1 09/26/2021 1240   GFRNONAA 52 (L) 06/25/2021 0341   GFRNONAA 49 (L) 01/09/2021 0959   GFRAA 57 (L) 01/09/2021 0959   Lab Results  Component Value Date   CHOL 129 09/26/2021   HDL 58 09/26/2021   LDLCALC 55 09/26/2021   TRIG 84 09/26/2021   CHOLHDL 2.2 09/26/2021   Lab Results  Component Value Date   HGBA1C 5.4 09/26/2021   Lab Results  Component Value  Date   VITAMINB12 370 07/19/2019   Lab Results  Component Value Date   TSH 1.88 09/26/2021      ASSESSMENT AND PLAN 76 y.o. year old male  has a past medical history of BPH (benign prostatic hyperplasia), Fibromyalgia, GERD (gastroesophageal reflux disease), Hyperlipidemia, Hypertension, Hypogonadism male, IBS (irritable bowel syndrome), and OSA (obstructive sleep apnea). here with:  OSA on CPAP  - CPAP compliance suboptimal encourage patient to use the machine greater than 4 hours each night - Good treatment of AHI when using machine  2.  Memory disturbance  - MOCA 26/30 previously 24/30 -Stable- will continue to monitor   FU in 1 year or sooner if needed    Ward Givens, MSN, NP-C 11/14/2021, 9:30 AM [facility] [address] [place], [place] [address] [phone]

## 2021-11-14 NOTE — Patient Instructions (Signed)
Continue using CPAP nightly and greater than 4 hours each night Memory score stable- continue to monitor If your symptoms worsen or you develop new symptoms please let us know.

## 2021-11-25 ENCOUNTER — Ambulatory Visit: Payer: Medicare Other | Admitting: Psychiatry

## 2021-11-29 ENCOUNTER — Other Ambulatory Visit: Payer: Self-pay

## 2021-11-29 ENCOUNTER — Ambulatory Visit (INDEPENDENT_AMBULATORY_CARE_PROVIDER_SITE_OTHER): Payer: Medicare Other | Admitting: Psychiatry

## 2021-11-29 DIAGNOSIS — F331 Major depressive disorder, recurrent, moderate: Secondary | ICD-10-CM

## 2021-11-29 NOTE — Progress Notes (Signed)
[facility] Counselor/Therapist Progress Note  Patient ID: Cody Philmore Keilen Kahl., MRN: [id number],    Date: 11/29/2021  Time Spent: 50 minutes   Treatment Type: Individual Therapy  Reported Symptoms: depression, anxiety, continued friction with separated wife  Mental Status Exam:  Appearance:   Casual     Behavior:  Appropriate, Sharing, and some motivation  Motor:  A little slower due to some health issues with aging  Speech/Language:   Clear and Coherent  Affect:  Depressed, anxiou  Mood:  anxious and depressed  Thought process:  Some tangentiality  Thought content:    Rumination  Sensory/Perceptual disturbances:    WNL  Orientation:  oriented to person, place, time/date, situation, day of week, month of year, year, and stated date of Feb. 24, 2023  Attention:  Fair  Concentration:  Fair  Memory:  Reports short term memory issues and states PCP is aware  Fund of knowledge:   Fair  Insight:    Fair  Judgment:   Good and Fair  Impulse Control:  Good and Fair   Risk Assessment: Danger to Self:  No Self-injurious Behavior: No Danger to Others: No Duty to Warn:no Physical Aggression / Violence:No  Access to Firearms a concern: No  Gang Involvement:No   Subjective: Patient in today reporting anxiety and depression with depression being the stronger symptom re: personal and family issues, strained relations with separated wife (although has lessened contactw with her) and "ex son-in-law" creating stress that impacts patient. Processed these stressors today and seemed to feel heard and supported.  Ruminating. Drifts off subject easily. Tending to look for what may go wrong vs rights. Difficulty staying on subject. Adult children check on him often. Maintains frequent contact with a sister and some long-time friends so loneliness has not been as much of a problem. Reviewed his being more careful in moving about and in any driving. Comfort Cody Hines still coming out to help  with cleaning and other household chores.Some discouragement in some of his aging and needing the help of others, and was also able to notice some progress he's made in healing over past few weeks, and was encouraging to him. To continue to refrain from isolating himself by staying in contact with others that are supportive of him.   Interventions: Solution-Oriented/Positive Psychology and Ego-Supportive  Treatment Goal Plan:  Patient is not signing tx goals on computer screen due to [place]. Treatment Goals: Goals remain on tx plan as patient works on strategies to achieve his goals.  Progress is documented each session in the "Progress" section of Plan. Long term goal: Develop healthy cognitive patterns and beliefs about self and the world that lead to alleviation and help prevent relapse of depression. Short term goal: Identify and replace depressive thinking that leads to depressive feelings and actions.  He will replace the depressive thinking with more positive, hopeful thoughts that can lead to more positive and hopeful feelings. Strategy: Patient to more consistently monitor his thoughts and catch the depressive/negative thoughts, trying to change them to positive and more reality-based thought patterns. Look at his negative thoughts and weigh them against the evidence.    Diagnosis:   ICD-10-CM   1. Major depressive disorder, recurrent episode, moderate (HCC)  F33.1       Plan:  Patient today showing motivation and actively participated in session today processing more of his anxious/depressed thoughts especially as they relate to patient and his separated wife and his former son-in-law where there's significant stress  dudeto some financial issues.Patient has refrained from sharing that info with his son but states he is calling son today and plans to talk with him about recent mail received re: former son-in-law. Patient does seem to have made some gains re: not quite as obsessed over  negative thoughts within some family relationships but tends to have difficulty and "letting go".  Does state that it helps him to calm and be able to vent and feel heard as he processes those concerns.  Encouraged patient in following doctor's recommendations about his moving about and driving, in addition to other tasks.  Encouraged him also in his practice of more positive behaviors including: Recognizing his progress more often, reaching out to family to initiate interactions especially with his adult children and grandchildren and sister, remain in contact with people who are supportive of him including longtime friends, use his cane when needed for stability and walking, consistent encouraging self talk as discussed with specific examples during session, getting outside some daily as he is able to do so carefully and with doctor approval, staying focused on the present and what he can control or change versus cannot, focus on enjoying situations more currently rather than going back and focusing on negativity from the past, remain on his prescribed medication, take advantage of opportunities he has to be with other people including his church/family/community, and recognize the strength he shows working with goal-directed behaviors to move forward in a more positive direction that supports improved emotional health.  Goal review and progress/challenges noted with patient.  Next appointment within 3 to 4 weeks.  This record has been created using [employer].  Chart creation errors have been sought, but may not always have been located and corrected.  Such creation errors do not reflect on the standard of medical care provided.   Shanon Ace, LCSW

## 2021-12-27 ENCOUNTER — Other Ambulatory Visit: Payer: Self-pay | Admitting: Internal Medicine

## 2022-01-03 ENCOUNTER — Ambulatory Visit (INDEPENDENT_AMBULATORY_CARE_PROVIDER_SITE_OTHER): Payer: Medicare Other | Admitting: Psychiatry

## 2022-01-03 DIAGNOSIS — F331 Major depressive disorder, recurrent, moderate: Secondary | ICD-10-CM
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
## 2022-01-08 NOTE — Progress Notes (Signed)
? ? ? ?Annual  Screening/Preventative Visit  ?& Comprehensive Evaluation & Examination ? ?Future Appointments  ?Date Time Provider Department  ?01/09/2022 10:00 AM [name], MD [facility]  ?02/04/2022  8:00 AM Shanon Ace, LCSW [facility]  ?03/25/2022  2:00 PM Cottle, Billey Co., MD [facility]  ?11/17/2022 10:00 AM Ward Givens, NP [facility]  ?01/15/2023 10:00 AM [name], MD [facility]  ? ?    ?     This very nice 76 y.o.  separated WM  presents for a Screening /Preventative Visit & comprehensive evaluation and management of multiple medical co-morbidities.  Patient has been followed for HTN, HLD, Prediabetes and Vitamin D Deficiency. Patient is followed by Dr Dohmeier & Ward Givens, NP  for mild cognitive impairment and OSA on CPAP.  Patient is still in psych therapy for counseling for Major Depression consequent of his marital separation.  CT Scan in 2020 showed Aortic atherosclerosis. ? ? ?    HTN predates since  40. Patient's BP has been controlled at home.  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 Today's BP is at goal - 128/70. Patient denies any cardiac symptoms as chest pain, palpitations, shortness of breath, dizziness or ankle swelling. ? ? ?    Patient's hyperlipidemia is controlled with diet and Rosuvastatin. Patient denies myalgias or other medication SE's. Last lipids were at goal : ? ?Lab Results  ?Component Value Date  ? CHOL 129 09/26/2021  ? HDL 58 09/26/2021  ? [place] 55 09/26/2021  ? TRIG 84 09/26/2021  ? CHOLHDL 2.2 09/26/2021  ? ? ? ?    Patient has hx/o prediabetes (A1c 6.0% /2011) and patient denies reactive hypoglycemic symptoms, visual blurring, diabetic polys or paresthesias. Last A1c was normal & at goal : ?  ?Lab Results  ?Component Value Date  ? HGBA1C 5.4 09/26/2021  ?  ? ? ?    Finally, patient has history of Vitamin D Deficiency ("29" /2008) and last vitamin D was still slightly low  : ?  ?Lab Results  ?Component Value Date  ? VD25OH 50 09/26/2021  ? ? ? ?Current  Outpatient Medications on File Prior to Visit  ?Medication Sig  ? acetaminophen 650 MG CR tablet Take 650 mg every 8  hours as needed for pain  ? aspirin EC 81 MG tablet Take daily  ? enalapril  20 MG tablet TAKE 1 TABLET  DAILY   ? LORazepam 1 MG tablet Take 1 tablet  at bedtime as needed for sleep.  ? methocarbamol 500 MG tablet Take 1 tablet every 6  hours as needed  ? Multiple Vitamin  Take 1 capsule th daily.  ? rosuvastatin 40 MG tablet TAKE 1 TABLET DAILY  ? sertraline 100 MG tablet Take 1 tablet in the morning and at bedtime.  ? ? ?Allergies  ?Allergen Reactions  ? Asa [Aspirin] Diarrhea, Nausea  and Hx/o  ulcers  ? Atorvastatin Muscle aches  ? Celebrex [Celecoxib] Headaches  ? Codeine Nausea And Vomiting  ? Savella [Milnacipran Hcl] not recalled  ? Savella [Milnacipran] not recalled  ? Viagra [Sildenafil Citrate] Headaches  ? Viagra [Sildenafil] Headaches  ? Penicillins Rash and   ? ? ?Past Medical History:  ?Diagnosis Date  ? BPH (benign prostatic hyperplasia)   ? Fibromyalgia   ? GERD (gastroesophageal reflux disease)   ? Hyperlipidemia   ? Hypertension   ? Hypogonadism male   ? IBS (irritable bowel syndrome)   ? OSA (obstructive sleep apnea)   ? ? ?  Health Maintenance  ?Topic Date Due  ? Zoster Vaccines- Shingrix (1 of 2) Never done  ? COVID-19 Vaccine  01/08/2020  ? TETANUS/TDAP  01/14/2020  ? INFLUENZA VACCINE  05/06/2022  ? Pneumonia Vaccine 59+ Years old  Completed  ? Hepatitis C Screening  Completed  ? HPV VACCINES  Aged Out  ? ? ? ?Immunization History  ?Administered Date(s) Administered  ? PFIZER SARS-COV-2 Vacc 11/19/2019, 12/11/2019  ? Pneumococcal -13 09/17/2018  ? Pneumococcal -23 09/26/2020  ? Pneumococcal -23 07/16/2011  ? Tdap 01/13/2010  ? Zoster, Live 08/09/2013  ? ? ?Last Colon -  04/22/2013 -  recommended  5 yr f/u  - overdue ? ?Past Surgical History:  ?Procedure Laterality Date  ?  nasal smr np3  1985  ? CATARACT EXTRACTION, BILATERAL Bilateral 2021  ? Dr. Katy Fitch, L 5/27, R 3/25  ? KNEE  ARTHROSCOPY Left 1999  ? SKIN CANCER EXCISION  2020  ? nose  ? [place] SURGERY  2007  ? L5 S 1 Disk  ? SPINE SURGERY  09/2019  ? Compression fracture stabilization by Dr. Arnoldo Morale  ? VASECTOMY  1983  ? ? ? ?Family History  ?Problem Relation Age of Onset  ? Cancer Mother   ?     breast  ? Cancer Father   ?     lung  ? Diabetes Father   ? Heart disease Sister   ? Arthritis Sister   ? ? ? ?Social History  ? ?Socioeconomic History  ? Marital status: Divorced  ?      ?Occupational History  ? 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 ?Tobacco Use  ? Smoking status: Never  ? Smokeless tobacco: Never  ?Vaping Use  ? Vaping Use: Never used  ?Substance Use Topics  ? Alcohol use: Not drinking x 1 month  ? Drug use: Never  ? ? ? ? ROS ?Constitutional: Denies fever, chills, weight loss/gain, headaches, insomnia,  night sweats or change in appetite. Does c/o fatigue. ?Eyes: Denies redness, blurred vision, diplopia, discharge, itchy or watery eyes.  ?ENT: Denies discharge, congestion, post nasal drip, epistaxis, sore throat, earache, hearing loss, dental pain, Tinnitus, Vertigo, Sinus pain or snoring.  ?Cardio: Denies chest pain, palpitations, irregular heartbeat, syncope, dyspnea, diaphoresis, orthopnea, PND, claudication or edema ?Respiratory: denies cough, dyspnea, DOE, pleurisy, hoarseness, laryngitis or wheezing.  ?Gastrointestinal: Denies dysphagia, heartburn, reflux, water brash, pain, cramps, nausea, vomiting, bloating, diarrhea, constipation, hematemesis, melena, hematochezia, jaundice or hemorrhoids ?Genitourinary: Denies dysuria, frequency, urgency, nocturia, hesitancy, discharge, hematuria or flank pain ?Musculoskeletal: Denies arthralgia, myalgia, stiffness, Jt. Swelling, pain, limp or strain/sprain. Denies Falls. ?Skin: Denies puritis, rash, hives, warts, acne, eczema or change in skin lesion ?Neuro: No weakness, tremor, incoordination, spasms, paresthesia or pain ?Psychiatric: Denies confusion, memory loss or sensory loss. Denies  Depression. ?Endocrine: Denies change in weight, skin, hair change, nocturia, and paresthesia, diabetic polys, visual blurring or hyper / hypo glycemic episodes.  ?Heme/Lymph: No excessive bleeding, bruising or enlarged lymph nodes. ? ? ?Physical Exam ? ?BP 128/70   Pulse 67   Temp 97.9 ?F (36.6 ?C)   Resp 16   Ht '6\' 1"'$  (1.854 m)   Wt 207 lb (93.9 kg)   SpO2 96%   BMI 27.31 kg/m?  ? ?General Appearance: Well nourished and in no apparent distress. ? ?Eyes: PERRLA, EOMs, conjunctiva no swelling or erythema, normal fundi and vessels. ?Sinuses: No frontal/maxillary tenderness ?ENT/Mouth: EACs patent / TMs  nl. Nares clear without erythema, swelling, mucoid exudates. Oral hygiene is good.  No erythema, swelling, or exudate. Tongue normal, non-obstructing. Tonsils not swollen or erythematous. Hearing normal.  ?Neck: Supple, thyroid not palpable. No bruits, nodes or JVD. ?Respiratory: Respiratory effort normal.  BS equal and clear bilateral without rales, rhonci, wheezing or stridor. ?Cardio: Heart sounds are normal with regular rate and rhythm and no murmurs, rubs or gallops. Peripheral pulses are normal and equal bilaterally without edema. No aortic or femoral bruits. ?Chest: symmetric with normal excursions and percussion.  ?Abdomen: Soft, with Nl bowel sounds. Nontender, no guarding, rebound, hernias, masses, or organomegaly.  ?Lymphatics: Non tender without lymphadenopathy.  ?Musculoskeletal: Full ROM all peripheral extremities, joint stability, 5/5 strength, and normal gait. ?Skin: Warm and dry without rashes, lesions, cyanosis, clubbing or  ecchymosis.  ?Neuro: Cranial nerves intact, reflexes equal bilaterally. Normal muscle tone, no cerebellar symptoms. Sensation intact.  ?Pysch: Alert and oriented x 3 with flat affect  & limited insight and judgment appropriate.  ? ?Assessment and Plan ? ?1. Annual Preventative/Screening Exam  ? ? ?2. Essential hypertension ? ?- EKG 12-Lead ?- [place], RETROPERITNL ABD,  LTD ?-  Urinalysis, Routine w reflex microscopic ?- Microalbumin / creatinine urine ratio ?- CBC with Differential/Platelet ?- COMPLETE METABOLIC PANEL WITH GFR ?- Magnesium ?- TSH ? ?3. Hyperlipidemia, mixed ? ?- EKG

## 2022-01-09 ENCOUNTER — Ambulatory Visit: Payer: Medicare Other | Admitting: Internal Medicine

## 2022-01-09 ENCOUNTER — Encounter: Payer: Self-pay | Admitting: Internal Medicine

## 2022-01-09 VITALS — BP 128/70 | HR 67 | Temp 97.9°F | Resp 16 | Ht 73.0 in | Wt 207.0 lb

## 2022-01-09 DIAGNOSIS — G250 Essential tremor: Secondary | ICD-10-CM

## 2022-01-09 DIAGNOSIS — N1831 Chronic kidney disease, stage 3a: Secondary | ICD-10-CM

## 2022-01-09 DIAGNOSIS — Z125 Encounter for screening for malignant neoplasm of prostate: Secondary | ICD-10-CM

## 2022-01-09 DIAGNOSIS — E559 Vitamin D deficiency, unspecified: Secondary | ICD-10-CM

## 2022-01-09 DIAGNOSIS — E782 Mixed hyperlipidemia: Secondary | ICD-10-CM

## 2022-01-09 DIAGNOSIS — Z79899 Other long term (current) drug therapy: Secondary | ICD-10-CM

## 2022-01-09 DIAGNOSIS — Z136 Encounter for screening for cardiovascular disorders: Secondary | ICD-10-CM

## 2022-01-09 DIAGNOSIS — I10 Essential (primary) hypertension: Secondary | ICD-10-CM

## 2022-01-09 DIAGNOSIS — F331 Major depressive disorder, recurrent, moderate: Secondary | ICD-10-CM

## 2022-01-09 DIAGNOSIS — I700 Atherosclerosis of aorta: Secondary | ICD-10-CM | POA: Diagnosis not present

## 2022-01-09 DIAGNOSIS — Z8249 Family history of ischemic heart disease and other diseases of the circulatory system: Secondary | ICD-10-CM | POA: Diagnosis not present

## 2022-01-09 DIAGNOSIS — Z87891 Personal history of nicotine dependence: Secondary | ICD-10-CM | POA: Diagnosis not present

## 2022-01-09 DIAGNOSIS — N401 Enlarged prostate with lower urinary tract symptoms: Secondary | ICD-10-CM

## 2022-01-09 DIAGNOSIS — Z0001 Encounter for general adult medical examination with abnormal findings: Secondary | ICD-10-CM

## 2022-01-09 DIAGNOSIS — G3184 Mild cognitive impairment, so stated: Secondary | ICD-10-CM

## 2022-01-09 DIAGNOSIS — Z0000 Encounter for general adult medical examination without abnormal findings: Secondary | ICD-10-CM

## 2022-01-09 DIAGNOSIS — G4733 Obstructive sleep apnea (adult) (pediatric): Secondary | ICD-10-CM

## 2022-01-09 DIAGNOSIS — R7309 Other abnormal glucose: Secondary | ICD-10-CM

## 2022-01-09 DIAGNOSIS — K219 Gastro-esophageal reflux disease without esophagitis: Secondary | ICD-10-CM

## 2022-01-09 DIAGNOSIS — Z1211 Encounter for screening for malignant neoplasm of colon: Secondary | ICD-10-CM

## 2022-01-09 NOTE — Patient Instructions (Signed)
Due to recent changes in healthcare laws, you may see the results of your imaging and laboratory studies on MyChart before your provider has had a chance to review them.  We understand that in some cases there may be results that are confusing or concerning to you. Not all laboratory results come back in the same time frame and the provider may be waiting for multiple results in order to interpret others.  Please give us 48 hours in order for your provider to thoroughly review all the results before contacting the office for clarification of your results.  ? ?+++++++++++++++++++++++++++++++ ? Vit D  & ?Vit C 1,000 mg   ?are recommended to help protect  ?against the Covid-19 and other Corona viruses.  ? ? Also it's recommended  ?to take  ?Zinc 50 mg  ?to help  ?protect against the Covid-19   ?and best place to get ? is also on [employer]  ?and don't pay more than 6-8 cents /pill !  ?================================ ?Coronavirus (COVID-19) Are you at risk? ? ?Are you at risk for the Coronavirus (COVID-19)? ? ?To be considered HIGH RISK for Coronavirus (COVID-19), you have to meet the following criteria: ? ?Traveled to [place], [place], [place], [place] or [place]; or in the [place] to [place], [place], [place]  ?or [place]; and have fever, cough, and shortness of breath within the last 2 weeks of travel OR ?Been in close contact with a person diagnosed with COVID-19 within the last 2 weeks and have  ?fever, cough,and shortness of breath ? ?IF YOU DO NOT MEET THESE CRITERIA, YOU ARE CONSIDERED LOW RISK FOR COVID-19. ? ?What to do if you are HIGH RISK for COVID-19? ? ?If you are having a medical emergency, call 911. ?Seek medical care right away. Before you go to a doctor?s office, urgent care or emergency department, ? call ahead and tell them about your recent travel, contact with someone diagnosed with COVID-19  ? and your symptoms.  ?You should receive instructions from your physician?s office regarding  next steps of care.  ?When you arrive at healthcare provider, tell the healthcare staff immediately you have returned from  ?visiting [place], [place], [place], [place] or [place]; or traveled in the [place] to [place], [place],  ?[place] or [place] in the last two weeks or you have been in close contact with a person diagnosed with  ?COVID-19 in the last 2 weeks.   ?Tell the health care staff about your symptoms: fever, cough and shortness of breath. ?After you have been seen by a medical provider, you will be either: ?Tested for (COVID-19) and discharged home on quarantine except to seek medical care if  ?symptoms worsen, and asked to  ?Stay home and avoid contact with others until you get your results (4-5 days)  ?Avoid travel on public transportation if possible (such as bus, train, or airplane) or ?Sent to the Emergency Department by EMS for evaluation, COVID-19 testing  and  ?possible admission depending on your condition and test results. ? ?What to do if you are LOW RISK for COVID-19? ? ?Reduce your risk of any infection by using the same precautions used for avoiding the common cold or flu:  ?Wash your hands often with soap and warm water for at least 20 seconds.  If soap and water are not readily available,  ?use an alcohol-based hand sanitizer with at least 60% alcohol.  ?If coughing or sneezing, cover your mouth and nose by coughing   or sneezing into the elbow areas of your shirt or coat, ? into a tissue or into your sleeve (not your hands). ?Avoid shaking hands with others and consider head nods or verbal greetings only. ?Avoid touching your eyes, nose, or mouth with unwashed hands.  ?Avoid close contact with people who are sick. ?Avoid places or events with large numbers of people in one location, like concerts or sporting events. ?Carefully consider travel plans you have or are making. ?If you are planning any travel outside or inside the [place], visit the CDC?s Travelers? Health webpage for  the latest health notices. ?If you have some symptoms but not all symptoms, continue to monitor at home and seek medical attention  ?if your symptoms worsen. ?If you are having a medical emergency, call 911. ?>>>>>>>>>>>>>>>>>>>>>>>>>>>>>>>>>>>>>>>>>>>>>>>>>>> ?We Do NOT Approve of [employer] ?> > > > > > > > > > > > > > > > > > > > > > > > > > > > > > > > > > >  > >  ? ? ?Preventive Care for Adults ? ?A healthy lifestyle and preventive care can promote health and wellness. Preventive health guidelines for men include the following key practices: ?A routine yearly physical is a good way to check with your health care provider about your health and preventative screening. It is a chance to share any concerns and updates on your health and to receive a thorough exam. ?Visit your dentist for a routine exam and preventative care every 6 months. Brush your teeth twice a day and floss once a day. Good oral hygiene prevents tooth decay and gum disease. ?The frequency of eye exams is based on your age, health, family medical history, use of contact lenses, and other factors. Follow your health care provider's recommendations for frequency of eye exams. ?Eat a healthy diet. Foods such as vegetables, fruits, whole grains, low-fat dairy products, and lean protein foods contain the nutrients you need without too many calories. Decrease your intake of foods high in solid fats, added sugars, and salt. Eat the right amount of calories for you. Get information about a proper diet from your health care provider, if necessary. ?Regular physical exercise is one of the most important things you can do for your health. Most adults should get at least 150 minutes of moderate-intensity exercise (any activity that increases your heart rate and causes you to sweat) each week. In addition, most adults need muscle-strengthening exercises on 2 or more days a week. ?Maintain a healthy weight. The body mass index (BMI) is a screening  tool to identify possible weight problems. It provides an estimate of body fat based on height and weight. Your health care provider can find your BMI and can help you achieve or maintain a healthy weight. For adults 20 years and older: ?A BMI below 18.5 is considered underweight. ?A BMI of 18.5 to 24.9 is normal. ?A BMI of 25 to 29.9 is considered overweight. ?A BMI of 30 and above is considered obese. ?Maintain normal blood lipids and cholesterol levels by exercising and minimizing your intake of saturated fat. Eat a balanced diet with plenty of fruit and vegetables. Blood tests for lipids and cholesterol should begin at age 20 and be repeated every 5 years. If your lipid or cholesterol levels are high, you are over 50, or you are at high risk for heart disease, you may need your cholesterol levels checked more frequently. Ongoing high lipid and cholesterol levels should be   treated with medicines if diet and exercise are not working. ?If you smoke, find out from your health care provider how to quit. If you do not use tobacco, do not start. ?Lung cancer screening is recommended for adults aged 55-80 years who are at high risk for developing lung cancer because of a history of smoking. A yearly low-dose CT scan of the lungs is recommended for people who have at least a 30-pack-year history of smoking and are a current smoker or have quit within the past 15 years. A pack year of smoking is smoking an average of 1 pack of cigarettes a day for 1 year (for example: 1 pack a day for 30 years or 2 packs a day for 15 years). Yearly screening should continue until the smoker has stopped smoking for at least 15 years. Yearly screening should be stopped for people who develop a health problem that would prevent them from having lung cancer treatment. ?If you choose to drink alcohol, do not have more than 2 drinks per day. One drink is considered to be 12 ounces (355 mL) of beer, 5 ounces (148 mL) of wine, or 1.5 ounces (44  mL) of liquor. ?Avoid use of street drugs. Do not share needles with anyone. Ask for help if you need support or instructions about stopping the use of drugs. ?High blood pressure causes heart disease and

## 2022-01-10 LAB — CBC WITH DIFFERENTIAL/PLATELET
Absolute Monocytes: 456 cells/uL (ref 200–950)
Basophils Absolute: 32 cells/uL (ref 0–200)
Basophils Relative: 0.6 %
Eosinophils Absolute: 212 cells/uL (ref 15–500)
Eosinophils Relative: 4 %
HCT: 39.9 % (ref 38.5–50.0)
Hemoglobin: 13.5 g/dL (ref 13.2–17.1)
Lymphs Abs: 1919 cells/uL (ref 850–3900)
MCH: 31.3 pg (ref 27.0–33.0)
MCHC: 33.8 g/dL (ref 32.0–36.0)
MCV: 92.4 fL (ref 80.0–100.0)
MPV: 10.8 fL (ref 7.5–12.5)
Monocytes Relative: 8.6 %
Neutro Abs: 2682 cells/uL (ref 1500–7800)
Neutrophils Relative %: 50.6 %
Platelets: 136 10*3/uL — ABNORMAL LOW (ref 140–400)
RBC: 4.32 10*6/uL (ref 4.20–5.80)
RDW: 13.3 % (ref 11.0–15.0)
Total Lymphocyte: 36.2 %
WBC: 5.3 10*3/uL (ref 3.8–10.8)

## 2022-01-10 LAB — COMPLETE METABOLIC PANEL WITH GFR
AG Ratio: 1.6 (calc) (ref 1.0–2.5)
ALT: 19 U/L (ref 9–46)
AST: 34 U/L (ref 10–35)
Albumin: 4.2 g/dL (ref 3.6–5.1)
Alkaline phosphatase (APISO): 91 U/L (ref 35–144)
BUN/Creatinine Ratio: 7 (calc) (ref 6–22)
BUN: 11 mg/dL (ref 7–25)
CO2: 23 mmol/L (ref 20–32)
Calcium: 9.7 mg/dL (ref 8.6–10.3)
Chloride: 108 mmol/L (ref 98–110)
Creat: 1.49 mg/dL — ABNORMAL HIGH (ref 0.70–1.28)
Globulin: 2.7 g/dL (calc) (ref 1.9–3.7)
Glucose, Bld: 121 mg/dL — ABNORMAL HIGH (ref 65–99)
Potassium: 4 mmol/L (ref 3.5–5.3)
Sodium: 139 mmol/L (ref 135–146)
Total Bilirubin: 1.4 mg/dL — ABNORMAL HIGH (ref 0.2–1.2)
Total Protein: 6.9 g/dL (ref 6.1–8.1)
eGFR: 49 mL/min/{1.73_m2} — ABNORMAL LOW (ref >=60)

## 2022-01-10 LAB — LIPID PANEL
Cholesterol: 137 mg/dL (ref <200)
HDL: 57 mg/dL (ref >=40)
LDL Cholesterol (Calc): 64 mg/dL (calc)
Non-HDL Cholesterol (Calc): 80 mg/dL (calc) (ref <130)
Total CHOL/HDL Ratio: 2.4 (calc) (ref <5.0)
Triglycerides: 75 mg/dL (ref <150)

## 2022-01-10 LAB — URINALYSIS, ROUTINE W REFLEX MICROSCOPIC
Bilirubin Urine: NEGATIVE
Glucose, UA: NEGATIVE
Hgb urine dipstick: NEGATIVE
Ketones, ur: NEGATIVE
Leukocytes,Ua: NEGATIVE
Nitrite: NEGATIVE
Protein, ur: NEGATIVE
Specific Gravity, Urine: 1.013 (ref 1.001–1.035)
pH: <=5 (ref 5.0–8.0)

## 2022-01-10 LAB — MICROALBUMIN / CREATININE URINE RATIO
Creatinine, Urine: 97 mg/dL (ref 20–320)
Microalb Creat Ratio: 4 mcg/mg creat (ref <30)
Microalb, Ur: 0.4 mg/dL

## 2022-01-10 LAB — PSA: PSA: 0.63 ng/mL (ref <=4.00)

## 2022-01-10 LAB — HEMOGLOBIN A1C
Hgb A1c MFr Bld: 5.5 % of total Hgb (ref <5.7)
Mean Plasma Glucose: 111 mg/dL
eAG (mmol/L): 6.2 mmol/L

## 2022-01-10 LAB — MAGNESIUM: Magnesium: 2.2 mg/dL (ref 1.5–2.5)

## 2022-01-10 LAB — VITAMIN D 25 HYDROXY (VIT D DEFICIENCY, FRACTURES): Vit D, 25-Hydroxy: 39 ng/mL (ref 30–100)

## 2022-01-10 LAB — INSULIN, RANDOM: Insulin: 47.3 u[IU]/mL — ABNORMAL HIGH

## 2022-01-10 LAB — TSH: TSH: 1.81 mIU/L (ref 0.40–4.50)

## 2022-01-10 NOTE — Progress Notes (Signed)
. . . . . . . . . . . . . . . . . . . . . . . . . . . . . . . . . . . . . . . . . . . . . . . . . . . . . . . . . . . . . . . . . . . . . . . . ? . . . . . . . . . . . . . . . . . . . . . . . . . . . . . . . . . . . . . . . . . . . . . . . . . . . . . . . . . . . . . . . . . . . . . . . . ?-  Test results slightly outside the reference range are not unusual. ?If there is anything important, I will review this with you,  ?otherwise it is considered normal test values.  ?If you have further questions,  ?please do not hesitate to contact me at the office or via My Chart.  ?. . . . . . . . . . . . . . . . . . . . . . . . . . . . . . . . . . . . . . . . . . . . . . . . . . . . . . . . . . . . . . . . . . . . . . . . ? . . . . . . . . . . . . . . . . . . . . . . . . . . . . . . . . . . . . . . . . . . . . . . . . . . . . . . . . . . . . . . . . . . . . . . . . ? ?-  Kidney functions still Stage 3a  & Stable  ?. . . . . . . . . . . . . . . . . . . . . . . . . . . . . . . . . . . . . . . . . . . . . . . . . . . . . . . . . . . . . . . . . . . . . . . . ? . . . . . . . . . . . . . . . . . . . . . . . . . . . . . . . . . . . . . . . . . . . . . . . . . . . . . . . . . . . . . . . . . . . . . . . . ? ?-  PSA still Low - Great  ! ?. . . . . . . . . . . . . . . . . . . . . . . . . . . . . . . . . . . . . . . . . . . . . . . . . . . . . . . . . . . . . . . . . . . . . . . . ? . . . . . . . . . . . . . . . . . . . . . . . . . . . . . . . . . . . . . . . . . . . . . . . . . . . . . . . . . . . . . . . . . . . . . . . . ? ?-  Total Chol  = 137    &   LDL Chol = 634  - Both  Excellent  ? ?- Very low risk for Heart Attack  / Stroke ?. . . . . . . . . . . . . . . . . . . . . . . . . . . . . . . . . . . . . . . . . . . . . . . . . . . . . . . . . . . . . . . . . . . . . . . . ? . . . . . . . . . . . . . . . . . . . . . . . . . . . . . . . . . . . . . . . . . . . . . . . . . . . . . . . . . . . . . . . . . . . . . . . . ? ?-  A1c  -  Normal - No Diabetes   -   Great ! ?. . . . . . . . . . . . . . . . . . . . . . . . . . . . . . . . . . . . . . . . . . . . . . . . . . . . . . . . . . . . . . . . . . . . . . . . ? . . . . . . . . . . . . . . . . . . . . . . . . . . . . . . . . . . . . . . . . . . . . . . . . . . . . . . . . . . . . . . . . . . . . . . . . ? ?-  Vitamin D = 39 is very low  ? ?- Vitamin D goal is between 70-100.  ? ?- Please take  Vitamin D 10,000 units Daily  ?           ( Vit D 5,000 x 2 capsules = 10,000 units )  ? ?- It is very important as a natural anti-inflammatory and helping the  ?immune system protect against viral infections, like the Covid-19  ? ? ?helping hair, skin, and nails, as well as reducing stroke and  ?heart attack risk.  ? ?- It helps your bones and helps with mood. ? ?- It also decreases numerous cancer risks so please  ?take it as directed.  ? ?- Low Vit D is associated with a 200-300% higher risk for  ?CANCER  ? ?and 200-300% higher risk for HEART   ATTACK  &  STROKE.   ? ?- It is also associated with higher death rate at younger ages,  ? ?autoimmune diseases like Rheumatoid arthritis, Lupus,  ?Multiple Sclerosis.    ? ?- Also many other serious conditions, like depression, Alzheimer's ? ?Dementia, infertility, muscle aches, fatigue, fibromyalgia  ? ?- just to name a few. ?. . . . . . . . . . . . . . . . . . . . . . . . . . . . . . . . . . . . . . . . . . . . . . . . . . . . . . . . . . . . . . . . . . . . . . . . ? . . . . . . . . . . . . . . . . . . . . . . . . . . . . . . . . . . . . . . . . . . . . . . . . . . . . . . . . . . . . . . . . . . . . . . . . ? ?-  All Else - CBC -  Electrolytes - Liver - Magnesium & Thyroid   ? ?- all  Normal / OK ?Marland Kitchen . . . . . . . . . . . . . . . . . . . . . . . . . . . . . . . . . . . . . . . . . . . . . . . . . . . . . . . . . . . . . . . . . . . . . . . ? . . . . . . . . . . . . . . . . . . . . . . . . . . . . . . . . . . . . . . . . . . . . . . . . . . . . . . . . . . .  . . . . . . . . . . . . . ? ? ? ? ? ? ? ? ? ? ? ? ? ? ? ? ? ? ? ? ? ? ? ? ? ? ? ?

## 2022-01-15 NOTE — Progress Notes (Signed)
Please mail copy of labs to patient

## 2022-02-04 ENCOUNTER — Ambulatory Visit (INDEPENDENT_AMBULATORY_CARE_PROVIDER_SITE_OTHER): Payer: Medicare Other | Admitting: Psychiatry

## 2022-02-04 DIAGNOSIS — F331 Major depressive disorder, recurrent, moderate: Secondary | ICD-10-CM | POA: Diagnosis not present

## 2022-02-04 NOTE — Progress Notes (Signed)
?    [facility] Counselor/Therapist Progress Note ? ?Patient ID: Cody Philmore Torion Hulgan., MRN: [id number],   ? ?Date: 02/04/2022 ? ?Time Spent: 50 minutes  ? ?Treatment Type: Individual Therapy ? ?Reported Symptoms: depression, anxiety ? ?Mental Status Exam: ? ?Appearance:   Casual     ?Behavior:  Appropriate, Sharing, and Motivated  ?Motor:  Normal  ?Speech/Language:   Clear and Coherent  ?Affect:  Depressed, anxious  ?Mood:  anxious and depressed  ?Thought process:  goal directed  ?Thought content:    Rumination and some obsessiveness  ?Sensory/Perceptual disturbances:    WNL  ?Orientation:  oriented to person, place, time/date, situation, day of week, month of year, year, and stated date of Feb 04, 2022  ?Attention:  Good  ?Concentration:  Fair  ?Memory:  WNL  ?Fund of knowledge:   Good  ?Insight:    Good and Fair  ?Judgment:   Good and Fair  ?Impulse Control:  Good  ? ?Risk Assessment: ?Danger to Self:  No ?Self-injurious Behavior: No ?Danger to Others: No ?Duty to Warn:no ?Physical Aggression / Violence:No  ?Access to Firearms a concern: No  ?Gang Involvement:No  ? ?Subjective:  Patient in today with symptoms of depression and anxiety, with depression being the stronger symptom.  Reports his anxiety and depression are mostly related to personal and family issues including challenging relationships with separated wife, adult daughter and her former husband. Easily gets stuck worrying about certain things over which he has no control. Very difficult to let go of annoyances, or things that don't go quite as planned. Tendency to heavily focus only on the "negatives". Later in session discussed some positives with patient including several really good friends that are very supportive of him, improvement made since his lawnmower accident several months ago able to live on his own right now, supportive adult children, his church friends, and still being able to drive.  Continue to encourage patient's use of healthy  boundaries when needed with some extended family members, good self-care, frequent contact with family members that are supportive including his adult children and a sister and some longtime friends that are almost like family, not isolating himself from other people, being careful as he moves about and in his driving, and trying to focus more on the positives versus negatives each day. ?  ?Interventions: Solution-Oriented/Positive Psychology, Ego-Supportive, and Insight-Oriented ? ?Treatment Goals: ?Goals remain on tx plan as patient works on strategies to achieve his goals.  Progress is documented each session in the "Progress" section of Plan. ?Long term goal: ?Develop healthy cognitive patterns and beliefs about self and the world that lead to alleviation and help prevent relapse of depression. ?Short term goal: ?Identify and replace depressive thinking that leads to depressive feelings and actions.  He will replace the depressive thinking with more positive, hopeful thoughts that can lead to more positive and hopeful feelings. ?Strategy: ?Patient to more consistently monitor his thoughts and catch the depressive/negative thoughts, trying to change them to positive and more reality-based thought patterns. Look at his negative thoughts and weigh them against the evidence. ? ?Diagnosis: ?  ICD-10-CM   ?1. Major depressive disorder, recurrent episode, moderate (HCC)  F33.1   ?  ? ?Plan:  Patient today participating well in session working more on his depression and some anxiety.  Processes a lot of his depressive and anxious thoughts/feelings regarding some family relationships, left over issues with separated wife that he finds it difficult to let go of, and feeling some hurt  from one of his adult kids.  Very difficult to let go of the negatives and tends to focus on them unless redirected which we did some in session today.  Highlighted some of the positives including friendships that he can really count on, his  still being able to drive, and the connection he has with his church which feels supportive for him.  Does seem to help patient to come in and talk through a lot of his frustrations, anger, misunderstandings, losses, and lingering resentment and anger that is hard for him to release. Encouraged patient in trying to practice more positive behaviors including: Reaching out to family and initiating interactions with adult children and grandchildren and sister, recognizing his progress often, remaining in contact with people who are supportive, using his cane when needed for stability and walking her doctor recommendation, consistent encouraging self talk as discussed with specific examples in sessions, getting outside daily as he is able and with doctor approval, staying focused on the present and what he can control or change versus cannot, focus on enjoying situations more currently rather than going back and focusing on negativity from the past, remain on his prescribed medication and take advantage of opportunities he has to be with other people including church/family/community, and recognize the strength he shows when working with goal-directed behaviors to move forward in a more positive direction that supports his improved emotional health and overall wellbeing. ? ?Goal review and progress/challenges noted with patient. ? ?Next appointment within 4 to 5 weeks. ? ?This record has been created using [employer].  Chart creation errors have been sought, but may not always have been located and corrected.  Such creation errors do not reflect on the standard of medical care provided. ? ? ?Shanon Ace, LCSW ? ? ? ? ? ? ? ? ? ? ? ? ? ? ? ? ? ? ?

## 2022-03-13 NOTE — Progress Notes (Unsigned)
03/13/2022 Cody Hines [id number] [birth year]  Referring provider: [name], MD Primary GI doctor: {acdocs:27040} (previously seen by Dr. Amedeo Plenty)  ASSESSMENT AND PLAN:   There are no diagnoses linked to this encounter.   Patient Care Team: [name], MD as PCP - General (Internal Medicine) Druscilla Brownie, MD as Consulting Physician (Dermatology) Teena Irani, MD (Inactive) as Consulting Physician (Gastroenterology) Newman Pies, MD as Consulting Physician (Neurosurgery) Clent Jacks, MD as Consulting Physician (Ophthalmology) [name], MD (Internal Medicine)  HISTORY OF PRESENT ILLNESS: 76 y.o. male with a past medical history of hypertension, hyperlipidemia, prediabetes, vitamin D D deficiency, OSA on CPAP, mild cognitive impairment, depression, CKD stage IIIa and others listed below presents for evaluation of possible colonoscopy.   04/22/2013 colonoscopy 6 mm polyp adenomatous transverse colon recommended 5-year follow-up.  With Dr. Amedeo Plenty  Current Medications:    Current Outpatient Medications (Cardiovascular):    enalapril (VASOTEC) 20 MG tablet, TAKE 1 TABLET BY MOUTH  DAILY FOR BLOOD PRESSURE   rosuvastatin (CRESTOR) 40 MG tablet, TAKE 1 TABLET BY MOUTH  DAILY FOR CHOLESTEROL   Current Outpatient Medications (Analgesics):    acetaminophen (TYLENOL) 650 MG CR tablet, Take 650 mg by mouth every 8 (eight) hours as needed for pain (or back pain or migraines).    aspirin EC 81 MG tablet, Take 81 mg by mouth daily.   Current Outpatient Medications (Other):    LORazepam (ATIVAN) 1 MG tablet, Take 1 tablet (1 mg total) by mouth at bedtime as needed for sleep.   methocarbamol (ROBAXIN) 500 MG tablet, Take 1 tablet (500 mg total) by mouth every 6 (six) hours as needed for muscle spasms.   Multiple Vitamin (MULTIVITAMIN) capsule, Take 1 capsule by mouth daily.   sertraline (ZOLOFT) 100 MG tablet, Take 1 tablet (100 mg total) by  mouth in the morning and at bedtime.  Medical History:  Past Medical History:  Diagnosis Date   BPH (benign prostatic hyperplasia)    Fibromyalgia    GERD (gastroesophageal reflux disease)    Hyperlipidemia    Hypertension    Hypogonadism male    IBS (irritable bowel syndrome)    OSA (obstructive sleep apnea)    Allergies:  Allergies  Allergen Reactions   Asa [Aspirin] Diarrhea, Nausea Only and Other (See Comments)    History of ulcers, also   Atorvastatin Other (See Comments)    Muscle aches   Celebrex [Celecoxib] Other (See Comments)    Headaches    Codeine Nausea And Vomiting   Savella [Milnacipran Hcl] Other (See Comments)    Reaction not recalled   Viagra [Sildenafil Citrate] Other (See Comments)    Headaches   Penicillins Rash     Surgical History:  He  has a past surgical history that includes Spine surgery (2007); Knee arthroscopy (Left, 1999);  nasal smr np3 (1985); Vasectomy (1983); Skin cancer excision (2020); Spine surgery (09/2019); and Cataract extraction, bilateral (Bilateral, 2021). Family History:  His family history includes Arthritis in his sister; Cancer in his father and mother; Diabetes in his father; Heart disease in his sister. Social History:   reports that he has never smoked. He has never used smokeless tobacco. He reports that he does not currently use alcohol. He reports that he does not use drugs.  REVIEW OF SYSTEMS  : All other systems reviewed and negative except where noted in the History of Present Illness.   PHYSICAL EXAM: There were no vitals taken for this visit. General:  Pleasant, well developed male in no acute distress Head:   Normocephalic and atraumatic. Eyes:  {sclerae:26738},conjunctive {conjuctiva:26739}  Heart:   {HEART EXAM HEM/ONC:21750} Pulm:  Clear anteriorly; no wheezing Abdomen:   {BlankSingle:19197::"Distended","Ridged","Soft"}, {BlankSingle:19197::"Flat","Obese","Non-distended"} AB,  {BlankSingle:19197::"Absent","Hyperactive, tinkling","Hypoactive","Sluggish","Active"} bowel sounds. {actendernessAB:27319} tenderness {anatomy; site abdomen:5010}. {BlankMultiple:19196::"Without guarding","With guarding","Without rebound","With rebound"}, No organomegaly appreciated. Rectal: {acrectalexam:27461} Extremities:  {With/Without:[id number]} edema. Msk: Symmetrical without gross deformities. Peripheral pulses intact.  Neurologic:  Alert and  oriented x4;  No focal deficits.  Skin:   Dry and intact without significant lesions or rashes. Psychiatric:  Cooperative. Normal mood and affect.    Vladimir Crofts, PA-C 12:44 PM

## 2022-03-17 ENCOUNTER — Other Ambulatory Visit (INDEPENDENT_AMBULATORY_CARE_PROVIDER_SITE_OTHER): Payer: Medicare Other

## 2022-03-17 ENCOUNTER — Ambulatory Visit: Payer: Medicare Other | Admitting: Physician Assistant

## 2022-03-17 ENCOUNTER — Encounter: Payer: Self-pay | Admitting: Physician Assistant

## 2022-03-17 VITALS — BP 130/48 | HR 97 | Ht 73.0 in | Wt 208.0 lb

## 2022-03-17 DIAGNOSIS — K7460 Unspecified cirrhosis of liver: Secondary | ICD-10-CM

## 2022-03-17 DIAGNOSIS — Z86010 Personal history of colonic polyps: Secondary | ICD-10-CM | POA: Diagnosis not present

## 2022-03-17 DIAGNOSIS — D696 Thrombocytopenia, unspecified: Secondary | ICD-10-CM | POA: Diagnosis not present

## 2022-03-17 DIAGNOSIS — K766 Portal hypertension: Secondary | ICD-10-CM | POA: Diagnosis not present

## 2022-03-17 DIAGNOSIS — K7682 Hepatic encephalopathy: Secondary | ICD-10-CM

## 2022-03-17 LAB — PROTIME-INR
INR: 1.3 ratio — ABNORMAL HIGH (ref 0.8–1.0)
Prothrombin Time: 14.5 s — ABNORMAL HIGH (ref 9.6–13.1)

## 2022-03-17 LAB — CBC WITH DIFFERENTIAL/PLATELET
Basophils Absolute: 0 10*3/uL (ref 0.0–0.1)
Basophils Relative: 0.7 % (ref 0.0–3.0)
Eosinophils Absolute: 0.2 10*3/uL (ref 0.0–0.7)
Eosinophils Relative: 4.5 % (ref 0.0–5.0)
HCT: 39.5 % (ref 39.0–52.0)
Hemoglobin: 13.4 g/dL (ref 13.0–17.0)
Lymphocytes Relative: 35.9 % (ref 12.0–46.0)
Lymphs Abs: 1.7 10*3/uL (ref 0.7–4.0)
MCHC: 34 g/dL (ref 30.0–36.0)
MCV: 91.7 fl (ref 78.0–100.0)
Monocytes Absolute: 0.5 10*3/uL (ref 0.1–1.0)
Monocytes Relative: 9.8 % (ref 3.0–12.0)
Neutro Abs: 2.3 10*3/uL (ref 1.4–7.7)
Neutrophils Relative %: 49.1 % (ref 43.0–77.0)
Platelets: 124 10*3/uL — ABNORMAL LOW (ref 150.0–400.0)
RBC: 4.31 Mil/uL (ref 4.22–5.81)
RDW: 14.6 % (ref 11.5–15.5)
WBC: 4.7 10*3/uL (ref 4.0–10.5)

## 2022-03-17 LAB — IBC + FERRITIN
Ferritin: 34.3 ng/mL (ref 22.0–322.0)
Iron: 126 ug/dL (ref 42–165)
Saturation Ratios: 37.3 % (ref 20.0–50.0)
TIBC: 337.4 ug/dL (ref 250.0–450.0)
Transferrin: 241 mg/dL (ref 212.0–360.0)

## 2022-03-17 LAB — COMPREHENSIVE METABOLIC PANEL
ALT: 12 U/L (ref 0–53)
AST: 25 U/L (ref 0–37)
Albumin: 3.7 g/dL (ref 3.5–5.2)
Alkaline Phosphatase: 81 U/L (ref 39–117)
BUN: 11 mg/dL (ref 6–23)
CO2: 26 mEq/L (ref 19–32)
Calcium: 9.3 mg/dL (ref 8.4–10.5)
Chloride: 106 mEq/L (ref 96–112)
Creatinine, Ser: 1.6 mg/dL — ABNORMAL HIGH (ref 0.40–1.50)
GFR: 42.46 mL/min — ABNORMAL LOW (ref >60.00)
Glucose, Bld: 168 mg/dL — ABNORMAL HIGH (ref 70–99)
Potassium: 3.9 mEq/L (ref 3.5–5.1)
Sodium: 140 mEq/L (ref 135–145)
Total Bilirubin: 1.4 mg/dL — ABNORMAL HIGH (ref 0.2–1.2)
Total Protein: 6.5 g/dL (ref 6.0–8.3)

## 2022-03-17 LAB — AMMONIA: Ammonia: 75 umol/L — ABNORMAL HIGH (ref 11–35)

## 2022-03-17 MED ORDER — PLENVU 140 G PO SOLR: 1.0000 | ORAL | 0 refills | Status: DC

## 2022-03-17 MED ORDER — LACTULOSE 10 GM/15ML PO SOLN
ORAL | 1 refills | Status: DC
Start: 2022-03-17 — End: 2022-12-08

## 2022-03-17 NOTE — Patient Instructions (Signed)
If you are age 74 or older, your body mass index should be between 23-30. Your Body mass index is 27.44 kg/m. If this is out of the aforementioned range listed, please consider follow up with your Primary Care Provider. ________________________________________________________  The [facility] providers would like to encourage you to use [facility] to communicate with providers for non-urgent requests or questions.  Due to long hold times on the telephone, sending your provider a message by [facility] may be a faster and more efficient way to get a response.  Please allow 48 business hours for a response.  Please remember that this is for non-urgent requests.  _______________________________________________________  Dennis Bast have been scheduled for an endoscopy and colonoscopy. Please follow the written instructions given to you at your visit today. Please pick up your prep supplies at the pharmacy within the next 1-3 days. If you use inhalers (even only as needed), please bring them with you on the day of your procedure.  Your provider has requested that you go to the basement level for lab work before leaving today. Press "B" on the elevator. The lab is located at the first door on the left as you exit the elevator.  You have been scheduled for an abdominal ultrasound at [facility] (1st floor of hospital) on 03/29/2022 at 9:00 am. Please arrive 15 minutes prior to your appointment for registration. Make certain not to have anything to eat or drink 6 hours prior to your appointment. Should you need to reschedule your appointment, please contact radiology at [phone]. This test typically takes about 30 minutes to perform.  Follow up pending.  Thank you for entrusting me with your care and choosing [facility].  Vicie Mutters, PA-C   You should have an imaging study every 6 months to monitor for the development of hepatocellular carcinoma (liver cancer). The risk is low, but, if liver  cancer is diagnosed early, there are better treatment options. Will get AFP, INR, CBC, and CMET.  Will follow up in 6 months to check labs and evaluate.   I recommend a high-protein, primarily plant-based diet. Avoid red meat. Work to maintain a health weight. Weigh yourself daily- call if you have weight gain of greater than 5 lbs in a 1-2 days, leg swelling, or new swelling in your abdomen. Minimize salt intake- VERY important. Please do not consume more than 2000 mg of sodium every day. Monitor your blood pressure at home.  Stay active. Weight-based exercise for 30 minutes at least 3 days a week is recommended. I recommend that you not drink any alcohol including beer, wine, liquor, and non-alcoholic beer.   You are at increased risk of osteopenia and osteoporosis. You should be screened for these metabolic bone diseases if you have not already had the testing performed.  Cirrhosis Cirrhosis is long-term (chronic) liver injury. The liver is the body's largest internal organ, and it performs many functions. It converts food into energy, removes toxic material from the blood, makes important proteins, and absorbs necessary vitamins from food. In cirrhosis, healthy liver cells are replaced by scar tissue. This prevents blood from flowing through the liver and makes it difficult for the liver to complete its functions. What are the causes? Common causes of this condition are hepatitis C and long-term alcohol abuse. Other causes include: Nonalcoholic fatty liver disease (NAFLD). This happens when fat is deposited in the liver by causes other than alcohol. Hepatitis B infection. Autoimmune hepatitis. In this condition, the body's defense system (  immune system) mistakenly attacks the liver cells, causing inflammation. Diseases that cause blockage of ducts inside the liver. Inherited liver diseases, such as hemochromatosis. This is one of the most common inherited liver diseases. In this disease,  deposits of iron collect in the liver and other organs. Reactions to certain long-term medicines, such as amiodarone, a heart medicine. Parasitic infections. These include schistosomiasis, which is caused by a flatworm. Long-term contact to certain toxins. These toxins include certain organic solvents, such as toluene and chloroform. What increases the risk? You are more likely to develop this condition if: You have certain types of viral hepatitis. You abuse alcohol, especially if you are male. You are overweight. You use IV drugs and share needles. You have unprotected sex with someone who has viral hepatitis. What are the signs or symptoms? You may not have any signs and symptoms at first. Symptoms may not develop until the damage to your liver starts to get worse. Early symptoms may include: Weakness and tiredness (fatigue). Changes in sleep patterns or having trouble sleeping. Itchiness. Tenderness in the right-upper part of your abdomen. Weight loss and muscle loss. Nausea. Loss of appetite. Later symptoms may include: Fatigue or weakness that is getting worse. Yellow skin and eyes (jaundice). Buildup of fluid in the abdomen (ascites). You may notice that your clothes are tight around your waist. Weight gain and swelling of the feet and ankles (edema). Trouble breathing. Easy bruising and bleeding. Vomiting blood, or black or bloody stool. Mental confusion. How is this diagnosed? Your health care provider may suspect cirrhosis based on your symptoms and medical history, especially if you have other medical conditions or a history of alcohol abuse. Your health care provider will do a physical exam to feel your liver and to check for signs of cirrhosis. Tests may include: Blood tests to check: For hepatitis B or C. Kidney function. Liver function. Imaging tests such as: MRI or CT scan to look for changes seen in advanced cirrhosis. Ultrasound to see if normal liver tissue  is being replaced by scar tissue. A procedure in which a long needle is used to take a sample of liver tissue to be checked in a lab (biopsy). Liver biopsy can confirm the diagnosis of cirrhosis. How is this treated? Treatment for this condition depends on how damaged your liver is and what caused the damage. It may include treating the symptoms of cirrhosis, or treating the underlying causes to slow the damage. Treatment may include: Making lifestyle changes, such as: Eating a healthy diet. You may need to work with your health care provider or a dietitian to develop an eating plan. Restricting salt intake. Maintaining a healthy weight. Not abusing drugs or alcohol. Taking medicines to: Treat liver infections or other infections. Control itching. Reduce fluid buildup. Reduce certain blood toxins. Reduce risk of bleeding from enlarged blood vessels in the stomach or esophagus (varices). Liver transplant. In this procedure, a liver from a donor is used to replace your diseased liver. This is done if cirrhosis has caused liver failure. Other treatments and procedures may be done depending on the problems that you get from cirrhosis. Common problems include liver-related kidney failure (hepatorenal syndrome). Follow these instructions at home:  Take medicines only as told by your health care provider. Do not use medicines that are toxic to your liver. Ask your health care provider before taking any new medicines, including over-the-counter medicines such as NSAIDs. Rest as needed. Eat a well-balanced diet. Limit your salt or water  intake, if your health care provider asks you to do this. Do not drink alcohol. This is especially important if you routinely take acetaminophen. Keep all follow-up visits. This is important. Contact a health care provider if you: Have fatigue or weakness that is getting worse. Develop swelling of the hands, feet, or legs, or a buildup of fluid in the abdomen  (ascites). Have a fever or chills. Develop loss of appetite. Have nausea or vomiting. Develop jaundice. Develop easy bruising or bleeding. Get help right away if you: Vomit bright red blood or a material that looks like coffee grounds. Have blood in your stools. Notice that your stools appear black and tarry. Become confused. Have chest pain or trouble breathing. These symptoms may represent a serious problem that is an emergency. Do not wait to see if the symptoms will go away. Get medical help right away. Call your local emergency services (911 in the [place] Do not drive yourself to the hospital. Summary Cirrhosis is chronic liver injury. Common causes are hepatitis C and long-term alcohol abuse. Tests used to diagnose cirrhosis include blood tests, imaging tests, and liver biopsy. Treatment for this condition involves treating the underlying cause. Avoid alcohol, drugs, salt, and medicines that may damage your liver. Get help right away if you vomit bright red blood or a material that looks like coffee grounds. This information is not intended to replace advice given to you by your health care provider. Make sure you discuss any questions you have with your health care provider. Document Revised: 07/05/2020 Document Reviewed: 07/05/2020 Elsevier Patient Education  [place].

## 2022-03-17 NOTE — Progress Notes (Signed)
Agree with assessment and plan as outlined. He very likely has cirrhosis given prior imaging and lab findings.  Sounds like this is more than likely related to alcohol, he should completely abstain from alcohol. He should have some basic serologies sent for chronic liver disease otherwise, would send TIBC/ferritin, hep C antibody, hepatitis B surface antigen, hepatitis B surface antibody, hepatitis A total antibody, if he can help him go to the lab for that while we await his ultrasound and endoscopic evaluation.  Thanks

## 2022-03-18 LAB — AFP TUMOR MARKER: AFP-Tumor Marker: 5.4 ng/mL

## 2022-03-19 ENCOUNTER — Ambulatory Visit (INDEPENDENT_AMBULATORY_CARE_PROVIDER_SITE_OTHER): Payer: Medicare Other | Admitting: Psychiatry

## 2022-03-19 DIAGNOSIS — F331 Major depressive disorder, recurrent, moderate: Secondary | ICD-10-CM | POA: Diagnosis not present

## 2022-03-19 NOTE — Progress Notes (Signed)
[facility] Counselor/Therapist Progress Note  Patient ID: Cody Philmore Antwoine Zorn., MRN: [id number],    Date: 03/19/2022  Time Spent: 50 minutes   Treatment Type: Individual Therapy  Reported Symptoms: depression, anxiety  Mental Status Exam:  Appearance:   Casual     Behavior:  Appropriate, Sharing, and Motivated  Motor:  Some slower in walking; occasional use of cane in walking  Speech/Language:   Clear and Coherent  Affect:  Depressed and anxious  Mood:  anxious and depressed  Thought process:  Some tangentiality  Thought content:    Rumination  Sensory/Perceptual disturbances:    WNL  Orientation:  oriented to person, place, time/date, situation, day of week, month of year, year, and stated date of June 14  Attention:  Good  Concentration:  Fair  Memory:  Some short term memory issue and Dr is aware  Fund of knowledge:   Good and Fair  Insight:    Fair  Judgment:   Good and Fair  Impulse Control:  Good and Fair   Risk Assessment: Danger to Self:  No Self-injurious Behavior: No Danger to Others: No Duty to Warn:no Physical Aggression / Violence:No  Access to Firearms a concern: No  Gang Involvement:No   Subjective: Patient in today reporting depression as his stronger symptom along with anxiety.  Symptoms mostly relate to personal and family issues that continue. Contact with separated wife continues to be conflictual. Patient easily annoyed and upset with contacts with separated wife. States is does help him to talk through his concerns at his appts like today. My observation is that patient finds it very difficult to let go of the frustrations, anger, and resentment in relation to his separated wife. Often gets stuck in worrying cycles. Working in session today on confronting this more to be able to enjoy the positives more and see how the negatives always lead to him feeling more anger and resentment.  Patient encouraged to follow through on practice from his  doctors and take medications as prescribed.  Also encouraged to continue contacts with longtime friends and his adult children and refrain from isolating himself.  Encouraged to focus more on the positives versus negatives daily and exercise care and caution when moving about and driving.  Interventions: Solution-Oriented/Positive Psychology and Ego-Supportive  Long term goal: Develop healthy cognitive patterns and beliefs about self and the world that lead to alleviation and help prevent relapse of depression. Short term goal: Identify and replace depressive thinking that leads to depressive feelings and actions.  He will replace the depressive thinking with more positive, hopeful thoughts that can lead to more positive and hopeful feelings. Strategy: Patient to more consistently monitor his thoughts and catch the depressive/negative thoughts, trying to change them to positive and more reality-based thought patterns. Look at his negative thoughts and weigh them against the evidence.  Diagnosis:   ICD-10-CM   1. Major depressive disorder, recurrent episode, moderate ([place])  F33.1      Plan: Patient today participated well in session as we worked further on his depression and anxiety, with his depression being more aggravated today which she explains is related to recent contacts with separated wife which always tends to be rather conflictual.  As noted above it is very difficult for patient to step away from these, interactions and work to let go of negativity.  It does seem to help him and he also agrees, to talk through these kind of situations and tried to gain some clarity  about how he may inadvertently contribute to some of this but also how he could better manage his feelings after these difficult interactions.  Some loneliness at times and states that his dog helps him a lot.  Feels slighted at times by certain family members not being in what he feels is frequent contact, and I encouraged him  to reach out to them.  Did review with patient some of the positives for him which includes certain non-family friendships, some church friends, some longtime "buddies" that he still connects with, he is still being able to drive with caution, and his sister who is very supportive of him.  Leave session today seeming lighter, able to laugh some and less anger and frustration.Encouraged patient in his practice of more positive outlook and behaviors including: Remaining in contact with people who are supportive, reaching out to family and initiating interactions with adult children and grandchildren and sister, using his cane when needed for stability and walking per doctor recommendation, consistent encouraging self talk as discussed with examples in sessions, getting outside daily as he is able with doctor approval, staying focused on the present and what he can change or control versus cannot, focus on enjoying situations more currently rather than going back and focusing on negative issues and situations from the past, remain on his prescribed medication and take advantage of opportunities he has to be with other people including church/family/community, and realize the strength he shows when working with goal directed behaviors to move forward in a more positive direction that supports his improved emotional health.  Goal review and progress/challenges noted with patient.  Next appointment within approximately 4 weeks.  This record has been created using [employer].  Chart creation errors have been sought, but may not always have been located and corrected.  Such creation errors do not reflect on the standard of medical care provided.   Shanon Ace, LCSW

## 2022-03-21 ENCOUNTER — Other Ambulatory Visit: Payer: Self-pay | Admitting: Adult Health

## 2022-03-21 ENCOUNTER — Other Ambulatory Visit: Payer: Medicare Other

## 2022-03-21 DIAGNOSIS — K7460 Unspecified cirrhosis of liver: Secondary | ICD-10-CM

## 2022-03-23 ENCOUNTER — Other Ambulatory Visit: Payer: Self-pay | Admitting: Internal Medicine

## 2022-03-24 LAB — HEPATITIS A ANTIBODY, TOTAL: Hepatitis A AB,Total: REACTIVE — AB

## 2022-03-24 LAB — HEPATITIS B SURFACE ANTIGEN: Hepatitis B Surface Ag: NONREACTIVE

## 2022-03-24 LAB — HEPATITIS B SURFACE ANTIBODY,QUALITATIVE: Hep B S Ab: REACTIVE — AB

## 2022-03-24 LAB — HEPATITIS B CORE ANTIBODY, TOTAL: Hep B Core Total Ab: NONREACTIVE

## 2022-03-25 ENCOUNTER — Ambulatory Visit (INDEPENDENT_AMBULATORY_CARE_PROVIDER_SITE_OTHER): Payer: Medicare Other | Admitting: Psychiatry

## 2022-03-25 ENCOUNTER — Encounter: Payer: Self-pay | Admitting: Psychiatry

## 2022-03-25 DIAGNOSIS — F411 Generalized anxiety disorder: Secondary | ICD-10-CM | POA: Diagnosis not present

## 2022-03-25 DIAGNOSIS — F5105 Insomnia due to other mental disorder: Secondary | ICD-10-CM

## 2022-03-25 DIAGNOSIS — E7220 Disorder of urea cycle metabolism, unspecified: Secondary | ICD-10-CM

## 2022-03-25 DIAGNOSIS — F6381 Intermittent explosive disorder: Secondary | ICD-10-CM

## 2022-03-25 DIAGNOSIS — F331 Major depressive disorder, recurrent, moderate: Secondary | ICD-10-CM

## 2022-03-25 MED ORDER — SERTRALINE HCL 100 MG PO TABS: 100.0000 mg | ORAL_TABLET | Freq: Two times a day (BID) | ORAL | 1 refills | Status: DC

## 2022-03-25 MED ORDER — LORAZEPAM 1 MG PO TABS: 1.0000 mg | ORAL_TABLET | Freq: Every evening | ORAL | 1 refills | Status: DC | PRN

## 2022-03-25 NOTE — Progress Notes (Signed)
Cody Hines. [id number] January 11, 1946 76 y.o.  Subjective:   Patient ID:  Cody Hines. is a 76 y.o. (DOB 1946-04-06) male.  Chief Complaint:  Chief Complaint  Patient presents with   Follow-up   Depression   Anxiety    Depression        Associated symptoms include fatigue and headaches.  Associated symptoms include no decreased concentration and no suicidal ideas.  Past medical history includes anxiety.   Anxiety Symptoms include nervous/anxious behavior. Patient reports no confusion, decreased concentration or suicidal ideas.      Cody Hines. presents to the office today for follow-up mood problems.  visit 11/21/19.  No med changes at last visit.  Still on sertraline 200 mg daily.   He's continued counseling.  05/21/20 appt with the following noted: Taking lorazepam 3-4 times weekly for sleep. About the same mentally, but physically back pain and can't do much and not much stamina from the fall he had before.  Fell off ladder while fixing downspout at wife's parents house and fx vertebra and wife took him back to care for him.   95% of time trying to get along with him.  Not losing his temper with her generally.    Able to see gkids more generally except for Covid with son.  Pt been vaccinated but still not good enough for son to see the gkids.   verall is improved but sx are not resolved. Plan no med changes  01/21/21 appt noted: Much better.  PT for 2 mos [facility] to deal with falls. Gotten better Still on sertraline 200 and lorazepam prn.  It is working and satisfied with the meds.  Mood is better.  Some memory problems but it is not markedly worse than when here.  Pending neuropsych testing. Pt reports that mood is pretty even keel.  Things don't bother him like they used to.  Wife barely speaks to him since March. Anxiety in large groups of people has to get out.  Pt reports has interrupted sleep. Better sleep when not working.  Goes to bed early. Trouble staying asleep.  Busy.  sUsually enough sleep.  Pt reports that appetite is good. Pt reports that energy is poor DT heat. Better than it was.   Concentration is air.. Suicidal thoughts:  denied by patient.  Likes movies.   Less angry and resentful of others than he used to be. Outbursts are generally resolved. He believes he's handling anger better.  Kids see big difference in the way he handles things. Plan continue to new sertraline 200 mg daily and lorazepam as needed  09/24/2021 appointment with the following noted: Cody Hines mower accident and trapped and broken back and hospitalized a week and 4 weeks of therapy. Can't get comfortable and pain awakens him often.  Just got Robaxin.  Not taking anything for sleep. House bound right now DT injuries. Over all mood is ok under the circumstances.  No SI.  Anxiety manageable. Satisfied with meds and no SE Plan: No med changes indicated.  03/25/22 appt noted: Still stiffness and weakness in back and can't stand for long. Can't help but worry bc cirrhosis dx.  Not drinking for 5 years.  May have had hepatitis in the past. Lives alone.   Was doing pretty well emotionally until last month wife came in wanting to split up the property and divorce.  She wants to buy house in [place] and be near gkids. She says she doesn't see  any change in him but other family and kids say he is different.  Son is supportive.  Tends to stay away from wife.  Son says he's done all he can do to please wife. No SE Ativan 1 weekly.  Therapy with Cody Hines CSW helpful. Wife living with daughter.  Drinks caffeinated sodas all day until 9pm.  Sober  in OCT 2018.   Past Psychiatric Medication Trials: citalopram, Wellbutrin, sertraline 200,  Xanax, lorazepam.  Review of Systems:  Review of Systems  Constitutional:  Positive for fatigue.  Musculoskeletal:  Positive for back pain, gait problem and neck pain.  Neurological:  Positive for tremors and  headaches. Negative for weakness.       Fewer falls.  Psychiatric/Behavioral:  Positive for depression. Negative for agitation, behavioral problems, confusion, decreased concentration, dysphoric mood, hallucinations, self-injury, sleep disturbance and suicidal ideas. The patient is nervous/anxious. The patient is not hyperactive.   Increase in migraine HA.  Medications: I have reviewed the patient's current medications.  Current Outpatient Medications  Medication Sig Dispense Refill   acetaminophen (TYLENOL) 650 MG CR tablet Take 650 mg by mouth every 8 (eight) hours as needed for pain (or back pain or migraines).      aspirin EC 81 MG tablet Take 81 mg by mouth daily.     enalapril (VASOTEC) 20 MG tablet TAKE 1 TABLET BY MOUTH  DAILY FOR BLOOD PRESSURE 120 tablet 2   lactulose (CHRONULAC) 10 GM/15ML solution Take 15 mL up to 3 times daily, goal is to have 2-3 bowel movements daily. 1800 mL 1   methocarbamol (ROBAXIN) 500 MG tablet Take 1 tablet (500 mg total) by mouth every 6 (six) hours as needed for muscle spasms. 20 tablet 0   Multiple Vitamin (MULTIVITAMIN) capsule Take 1 capsule by mouth daily.     PEG-KCl-NaCl-NaSulf-Na Asc-C (PLENVU) 140 g SOLR Take 1 kit by mouth as directed. 1 each 0   rosuvastatin (CRESTOR) 40 MG tablet Take  1 tablet  Daily  for Cholesterol 90 tablet 3   LORazepam (ATIVAN) 1 MG tablet Take 1 tablet (1 mg total) by mouth at bedtime as needed for sleep. 30 tablet 1   sertraline (ZOLOFT) 100 MG tablet Take 1 tablet (100 mg total) by mouth in the morning and at bedtime. 180 tablet 1   No current facility-administered medications for this visit.    Medication Side Effects: None  Allergies:  Allergies  Allergen Reactions   Asa [Aspirin] Diarrhea, Nausea Only and Other (See Comments)    History of ulcers, also   Atorvastatin Other (See Comments)    Muscle aches   Celebrex [Celecoxib] Other (See Comments)    Headaches    Codeine Nausea And Vomiting   Savella  [Milnacipran Hcl] Other (See Comments)    Reaction not recalled   Viagra [Sildenafil Citrate] Other (See Comments)    Headaches   Penicillins Rash    Past Medical History:  Diagnosis Date   BPH (benign prostatic hyperplasia)    Fibromyalgia    GERD (gastroesophageal reflux disease)    Hyperlipidemia    Hypertension    Hypogonadism male    IBS (irritable bowel syndrome)    OSA (obstructive sleep apnea)     Family History  Problem Relation Age of Onset   Cancer Mother        breast   Cancer Father        lung   Diabetes Father    Heart disease Sister  Arthritis Sister     Social History   Socioeconomic History   Marital status: Divorced    Spouse name: Kennyth Lose   Number of children: Not on file   Years of education: Not on file   Highest education level: Not on file  Occupational History   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  Tobacco Use   Smoking status: Never   Smokeless tobacco: Never  Vaping Use   Vaping Use: Never used  Substance and Sexual Activity   Alcohol use: Not Currently    Comment: Not drinking x 1 month   Drug use: Never   Sexual activity: Not Currently  Other Topics Concern   Not on file  Social History Narrative   ** Merged History Encounter **       Social Determinants of Health   Financial Resource Strain: Not on file  Food Insecurity: Not on file  Transportation Needs: Not on file  Physical Activity: Not on file  Stress: Not on file  Social Connections: Not on file  Intimate Partner Violence: Not on file    Past Medical History, Surgical history, Social history, and Family history were reviewed and updated as appropriate.   Please see review of systems for further details on the patient's review from today.   Objective:   Physical Exam:  There were no vitals taken for this visit.  Physical Exam Neurological:     Mental Status: He is alert and oriented to person, place, and time.     Cranial Nerves: No dysarthria.  Psychiatric:         Attention and Perception: Attention and perception normal.        Mood and Affect: Mood is anxious and depressed.        Speech: Speech normal.        Behavior: Behavior is cooperative.        Thought Content: Thought content normal. Thought content is not paranoid or delusional. Thought content does not include homicidal or suicidal ideation. Thought content does not include suicidal plan.        Cognition and Memory: Cognition and memory normal.        Judgment: Judgment normal.     Comments: Insight intact Mild anxiety and dysphoria. Talkative without pressure     Lab Review:     Component Value Date/Time   NA 140 03/17/2022 1140   K 3.9 03/17/2022 1140   CL 106 03/17/2022 1140   CO2 26 03/17/2022 1140   GLUCOSE 168 (H) 03/17/2022 1140   BUN 11 03/17/2022 1140   CREATININE 1.60 (H) 03/17/2022 1140   CREATININE 1.49 (H) 01/09/2022 1108   CALCIUM 9.3 03/17/2022 1140   PROT 6.5 03/17/2022 1140   ALBUMIN 3.7 03/17/2022 1140   AST 25 03/17/2022 1140   ALT 12 03/17/2022 1140   ALKPHOS 81 03/17/2022 1140   BILITOT 1.4 (H) 03/17/2022 1140   GFRNONAA 52 (L) 06/25/2021 0341   GFRNONAA 49 (L) 01/09/2021 0959   GFRAA 57 (L) 01/09/2021 0959       Component Value Date/Time   WBC 4.7 03/17/2022 1140   RBC 4.31 03/17/2022 1140   HGB 13.4 03/17/2022 1140   HCT 39.5 03/17/2022 1140   PLT 124.0 (L) 03/17/2022 1140   MCV 91.7 03/17/2022 1140   MCH 31.3 01/09/2022 1108   MCHC 34.0 03/17/2022 1140   RDW 14.6 03/17/2022 1140   LYMPHSABS 1.7 03/17/2022 1140   MONOABS 0.5 03/17/2022 1140   EOSABS 0.2 03/17/2022 1140   BASOSABS  0.0 03/17/2022 1140    No results found for: "POCLITH", "LITHIUM"   No results found for: "PHENYTOIN", "PHENOBARB", "VALPROATE", "CBMZ"   .res Assessment: Plan:    Aeden was seen today for follow-up, depression and anxiety.  Diagnoses and all orders for this visit:  Major depressive disorder, recurrent episode, moderate (HCC) -     sertraline  (ZOLOFT) 100 MG tablet; Take 1 tablet (100 mg total) by mouth in the morning and at bedtime.  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  Generalized anxiety disorder -     sertraline (ZOLOFT) 100 MG tablet; Take 1 tablet (100 mg total) by mouth in the morning and at bedtime.  Insomnia due to mental condition -     LORazepam (ATIVAN) 1 MG tablet; Take 1 tablet (1 mg total) by mouth at bedtime as needed for sleep.  Hyperammonemia (HCC)   Greater than 50% of 30 min face to face time with patient was spent on counseling and coordination of care. We discussed his chronic depression and anxiety and irritability, anger.  He's markedly better but has residual symptoms.  Most of mood problem is situational.   Benefit from meds and therapy. No change in therapy indicated.  Needs to continue the meds bc of a high relapse risk.  Supportive therapy re: dealing with stress of separation. Doesn't look like things will change.  And also disc recent health concern  Disc recent dx hepatittis and cirrhosis and mental health effects of high ammonia levels.  Option potentiate with something but defer.  Not likely to help bc it appears mostly situational.  Disc caffeine and sleep.  Takes it up to 9 pm.  Rec avoid late caffeine.  We discussed the short-term risks associated with benzodiazepines including sedation and increased fall risk among others.  Discussed long-term side effect risk including dependence, potential withdrawal symptoms, and the potential eventual dose-related risk of dementia.  No evidence of abuse.Take LED lorazepam.  Used less since here.  Continue sertraline 200.  Disc sobriety maintenance.   Benefit from therapy.  No med changes indicated.  FU 6 mos.  Lynder Parents, MD, DFAPA   Please see After Visit Summary for patient specific instructions.  Future Appointments  Date  Time Provider [place]  04/01/2022  9:00 AM [facility] 1 [facility] [place]  04/22/2022  9:30 AM Darrol Jump, NP [facility] None  04/22/2022  2:00 PM Armbruster, Carlota Raspberry, MD [facility]  07/24/2022  9:30 AM [name], MD [facility] None  11/17/2022 10:00 AM Ward Givens, NP [facility] None  01/15/2023 10:00 AM [name], MD [facility] None    No orders of the defined types were placed in this encounter.     -------------------------------

## 2022-04-01 ENCOUNTER — Ambulatory Visit (HOSPITAL_COMMUNITY)
Admission: RE | Admit: 2022-04-01 | Discharge: 2022-04-01 | Disposition: A | Discharge status: Home / Self Care | Payer: Medicare Other | Source: Ambulatory Visit | Attending: Physician Assistant | Admitting: Physician Assistant

## 2022-04-01 DIAGNOSIS — K7460 Unspecified cirrhosis of liver: Secondary | ICD-10-CM | POA: Diagnosis present

## 2022-04-02 ENCOUNTER — Telehealth: Payer: Self-pay | Admitting: Physician Assistant

## 2022-04-02 NOTE — Telephone Encounter (Signed)
Refer to imaging result note 6/27.

## 2022-04-15 ENCOUNTER — Encounter: Payer: Self-pay | Admitting: Gastroenterology

## 2022-04-16 ENCOUNTER — Encounter: Payer: Self-pay | Admitting: Certified Registered Nurse Anesthetist

## 2022-04-22 ENCOUNTER — Ambulatory Visit: Payer: Medicare Other | Admitting: Nurse Practitioner

## 2022-04-22 ENCOUNTER — Encounter: Payer: Self-pay | Admitting: Gastroenterology

## 2022-04-22 ENCOUNTER — Ambulatory Visit (AMBULATORY_SURGERY_CENTER): Payer: Medicare Other | Admitting: Gastroenterology

## 2022-04-22 VITALS — BP 134/71 | HR 81 | Temp 98.9°F | Resp 16 | Ht 73.0 in | Wt 208.0 lb

## 2022-04-22 DIAGNOSIS — Z09 Encounter for follow-up examination after completed treatment for conditions other than malignant neoplasm: Secondary | ICD-10-CM | POA: Diagnosis not present

## 2022-04-22 DIAGNOSIS — K2970 Gastritis, unspecified, without bleeding: Secondary | ICD-10-CM

## 2022-04-22 DIAGNOSIS — K3189 Other diseases of stomach and duodenum: Secondary | ICD-10-CM | POA: Diagnosis not present

## 2022-04-22 DIAGNOSIS — K766 Portal hypertension: Secondary | ICD-10-CM

## 2022-04-22 DIAGNOSIS — D125 Benign neoplasm of sigmoid colon: Secondary | ICD-10-CM

## 2022-04-22 DIAGNOSIS — Z86010 Personal history of colonic polyps: Secondary | ICD-10-CM

## 2022-04-22 DIAGNOSIS — K7460 Unspecified cirrhosis of liver: Secondary | ICD-10-CM

## 2022-04-22 MED ORDER — SODIUM CHLORIDE 0.9 % IV SOLN: 500.0000 mL | INTRAVENOUS | Status: DC

## 2022-04-22 NOTE — Progress Notes (Signed)
[facility] History and Physical   Primary Care Physician:  [name], MD   Reason for Procedure:   Cirrhosis - rule out varices, history of colon polyps  Plan:    EGD and colonoscopy     HPI: Cody Hines. is a 76 y.o. male  here for EGD and colonoscopy to evaluate issues above. 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 Last colonoscopy 04/2013 with an adenoma removed. No family history of colon cancer known. Otherwise feels well without any cardiopulmonary symptoms. Have discussed risks / benefits of procedures and anesthesia with him and he wants to proceed.   Past Medical History:  Diagnosis Date   BPH (benign prostatic hyperplasia)    Fibromyalgia    GERD (gastroesophageal reflux disease)    Hyperlipidemia    Hypertension    Hypogonadism male    IBS (irritable bowel syndrome)    OSA (obstructive sleep apnea)     Past Surgical History:  Procedure Laterality Date    nasal smr np3  1985   CATARACT EXTRACTION, BILATERAL Bilateral 2021   Dr. Katy Fitch, L 5/27, R 3/25   COLONOSCOPY     KNEE ARTHROSCOPY Left 1999   SKIN CANCER EXCISION  2020   nose   SPINE SURGERY  2007   L5 S 1 Disk   SPINE SURGERY  09/2019   Compression fracture stabilization by Dr. Eber Jones  1983    Prior to Admission medications   Medication Sig Start Date End Date Taking? Authorizing Provider  acetaminophen (TYLENOL) 650 MG CR tablet Take 650 mg by mouth every 8 (eight) hours as needed for pain (or back pain or migraines).    Yes [provider]  aspirin EC 81 MG tablet Take 81 mg by mouth daily.   Yes [provider]  enalapril (VASOTEC) 20 MG tablet TAKE 1 TABLET BY MOUTH  DAILY FOR BLOOD PRESSURE 03/21/22  Yes Liane Comber, NP  LORazepam (ATIVAN) 1 MG tablet Take 1 tablet (1 mg total) by mouth at bedtime as needed for sleep. 03/25/22  Yes Cottle, Billey Co., MD  rosuvastatin (CRESTOR) 40 MG tablet Take  1 tablet   Daily  for Cholesterol 03/23/22  Yes [name], MD  sertraline (ZOLOFT) 100 MG tablet Take 1 tablet (100 mg total) by mouth in the morning and at bedtime. 03/25/22  Yes Cottle, Billey Co., MD  lactulose (CHRONULAC) 10 GM/15ML solution Take 15 mL up to 3 times daily, goal is to have 2-3 bowel movements daily. 03/17/22   Vladimir Crofts, PA-C  methocarbamol (ROBAXIN) 500 MG tablet Take 1 tablet (500 mg total) by mouth every 6 (six) hours as needed for muscle spasms. 09/23/21   [name], MD  Multiple Vitamin (MULTIVITAMIN) capsule Take 1 capsule by mouth daily.    [provider]    Current Outpatient Medications  Medication Sig Dispense Refill   acetaminophen (TYLENOL) 650 MG CR tablet Take 650 mg by mouth every 8 (eight) hours as needed for pain (or back pain or migraines).      aspirin EC 81 MG tablet Take 81 mg by mouth daily.     enalapril (VASOTEC) 20 MG tablet TAKE 1 TABLET BY MOUTH  DAILY FOR BLOOD PRESSURE 120 tablet 2   LORazepam (ATIVAN) 1 MG tablet Take 1 tablet (1 mg total) by mouth at bedtime as needed for sleep. 30 tablet 1   rosuvastatin (CRESTOR) 40 MG tablet Take  1 tablet  Daily  for Cholesterol 90 tablet 3   sertraline (ZOLOFT) 100 MG tablet Take 1 tablet (100 mg total) by mouth in the morning and at bedtime. 180 tablet 1   lactulose (CHRONULAC) 10 GM/15ML solution Take 15 mL up to 3 times daily, goal is to have 2-3 bowel movements daily. 1800 mL 1   methocarbamol (ROBAXIN) 500 MG tablet Take 1 tablet (500 mg total) by mouth every 6 (six) hours as needed for muscle spasms. 20 tablet 0   Multiple Vitamin (MULTIVITAMIN) capsule Take 1 capsule by mouth daily.     Current Facility-Administered Medications  Medication Dose Route Frequency Provider Last Rate Last Admin   0.9 %  sodium chloride infusion  500 mL Intravenous Continuous Muntaha Vermette, Carlota Raspberry, MD        Allergies as of 04/22/2022 - Review Complete 04/22/2022  Allergen Reaction Noted   Asa  [aspirin] Diarrhea, Nausea Only, and Other (See Comments) 08/09/2013   Atorvastatin Other (See Comments) 12/12/2014   Celebrex [celecoxib] Other (See Comments) 11/08/2013   Codeine Nausea And Vomiting 08/09/2013   Savella [milnacipran hcl] Other (See Comments) 07/15/2013   Viagra [sildenafil citrate] Other (See Comments) 07/15/2013   Penicillins Rash 06/22/2021    Family History  Problem Relation Age of Onset   Cancer Mother        breast   Cancer Father        lung   Diabetes Father    Heart disease Sister    Arthritis Sister    Colon cancer Neg Hx    Colon polyps Neg Hx     Social History   Socioeconomic History   Marital status: Divorced    Spouse name: Kennyth Lose   Number of children: Not on file   Years of education: Not on file   Highest education level: Not on file  Occupational History   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  Tobacco Use   Smoking status: Never   Smokeless tobacco: Never  Vaping Use   Vaping Use: Never used  Substance and Sexual Activity   Alcohol use: Not Currently    Comment: Not drinking x 1 month   Drug use: Never   Sexual activity: Not Currently  Other Topics Concern   Not on file  Social History Narrative   ** Merged History Encounter **       Social Determinants of Health   Financial Resource Strain: Not on file  Food Insecurity: Not on file  Transportation Needs: Not on file  Physical Activity: Not on file  Stress: Not on file  Social Connections: Not on file  Intimate Partner Violence: Not on file    Review of Systems: All other review of systems negative except as mentioned in the HPI.  Physical Exam: Vital signs BP (!) 167/84   Pulse 68   Temp 98.9 F (37.2 C) (Temporal)   Resp 10   Ht '6\' 1"'$  (1.854 m)   Wt 208 lb (94.3 kg)   SpO2 98%   BMI 27.44 kg/m   General:   Alert,  Well-developed, pleasant and cooperative in NAD Lungs:  Clear throughout to auscultation.   Heart:  Regular rate and rhythm Abdomen:  Soft, nontender and  nondistended.   Neuro/Psych:  Alert and cooperative. Normal mood and affect. A and O x 3  Jolly Mango, MD [facility]

## 2022-04-22 NOTE — Progress Notes (Signed)
Report given to PACU, vss 

## 2022-04-22 NOTE — Op Note (Signed)
Cody Hines: Cody Hines Procedure Date: 04/22/2022 2:02 PM MRN: [id number] Endoscopist: Cody Hines , MD Age: 76 Referring MD:  Date of Birth: December 19, 1945 Gender: Male Account #: [id number] Procedure:                Upper GI endoscopy Indications:              newly diagnosied cirrhosis, mild hepatic                            encephalopathy, rule out esophageal varices Medicines:                Monitored Anesthesia Care Procedure:                Pre-Anesthesia Assessment:                           - Prior to the procedure, a History and Physical                            was performed, and patient medications and                            allergies were reviewed. The patient's tolerance of                            previous anesthesia was also reviewed. The risks                            and benefits of the procedure and the sedation                            options and risks were discussed with the patient.                            All questions were answered, and informed consent                            was obtained. Prior Anticoagulants: The patient has                            taken no previous anticoagulant or antiplatelet                            agents. ASA Grade Assessment: III - A patient with                            severe systemic disease. After reviewing the risks                            and benefits, the patient was deemed in                            satisfactory condition to undergo the procedure.  After obtaining informed consent, the endoscope was                            passed under direct vision. Throughout the                            procedure, the patient's blood pressure, pulse, and                            oxygen saturations were monitored continuously. The                            Endoscope was introduced through the mouth, and                            advanced to  the second part of duodenum. The upper                            GI endoscopy was accomplished without difficulty.                            The patient tolerated the procedure well. Scope In: Scope Out: Findings:                 Esophagogastric landmarks were identified: the                            Z-line was found at 41 cm, the gastroesophageal                            junction was found at 41 cm and the upper extent of                            the gastric folds was found at 41 cm from the                            incisors.                           The Z-line was regular.                           The exam of the esophagus was otherwise normal. No                            varices                           Diffuse mildly erythematous mucosa was found in the                            gastric fundus and in the gastric body, consistent                            with mild portal  hypertensive gastritis.                           The exam of the stomach was otherwise normal. No                            varices                           Biopsies were taken with a cold forceps in the                            gastric body, at the incisura and in the gastric                            antrum for Helicobacter pylori testing.                           The examined duodenum was normal. Complications:            No immediate complications. Estimated blood loss:                            Minimal. Estimated Blood Loss:     Estimated blood loss was minimal. Impression:               - Esophagogastric landmarks identified.                           - Z-line regular.                           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  Biopsies                            taken to rule out H pylori                           - Normal examined duodenum. Recommendation:           - Patient has a contact number available for                            emergencies. The signs and symptoms of potential  delayed complications were discussed with the                            patient. Return to normal activities tomorrow.                            Written discharge instructions were provided to the                            patient.                           - Resume previous diet.                           - Continue present medications.                           - Await pathology results.                           - Repeat EGD in 1-2 years for surveillance Cody Lipps P. Cody Croslin, MD 04/22/2022 2:58:55 PM This report has been signed electronically.

## 2022-04-22 NOTE — Progress Notes (Signed)
1400 Robinul 0.1 mg IV given due large amount of secretions upon assessment.  MD made aware, vss 

## 2022-04-22 NOTE — Progress Notes (Signed)
Called to room to assist during endoscopic procedure.  Patient ID and intended procedure confirmed with present staff. Received instructions for my participation in the procedure from the performing physician.  

## 2022-04-22 NOTE — Op Note (Signed)
[place] Patient Name: Cody Hines Procedure Date: 04/22/2022 2:01 PM MRN: [id number] Endoscopist: Remo Lipps P. Havery Moros , MD Age: 76 Referring MD:  Date of Birth: June 17, 1946 Gender: Male Account #: [id number] Procedure:                Colonoscopy Indications:              High risk colon cancer surveillance: Personal                            history of colonic polyps - adenoma removed 04/2013 Medicines:                Monitored Anesthesia Care Procedure:                Pre-Anesthesia Assessment:                           - Prior to the procedure, a History and Physical                            was performed, and patient medications and                            allergies were reviewed. The patient's tolerance of                            previous anesthesia was also reviewed. The risks                            and benefits of the procedure and the sedation                            options and risks were discussed with the patient.                            All questions were answered, and informed consent                            was obtained. Prior Anticoagulants: The patient has                            taken no previous anticoagulant or antiplatelet                            agents. ASA Grade Assessment: III - A patient with                            severe systemic disease. After reviewing the risks                            and benefits, the patient was deemed in                            satisfactory condition to undergo the procedure.  After obtaining informed consent, the colonoscope                            was passed under direct vision. Throughout the                            procedure, the patient's blood pressure, pulse, and                            oxygen saturations were monitored continuously. The                            CF [id number] #[id number] was introduced through the anus                            and  advanced to the the cecum, identified by                            appendiceal orifice and ileocecal valve. The                            colonoscopy was performed without difficulty. The                            patient tolerated the procedure well. The quality                            of the bowel preparation was good. The ileocecal                            valve, appendiceal orifice, and rectum were                            photographed. Scope In: 2:26:22 PM Scope Out: 2:45:19 PM Scope Withdrawal Time: 0 hours 14 minutes 1 second  Total Procedure Duration: 0 hours 18 minutes 57 seconds  Findings:                 The perianal and digital rectal examinations were                            normal.                           Multiple small angiodysplastic lesions were found                            in the sigmoid colon (one), in the transverse colon                            (a few) and in the ascending colon (a few).                           Congested mucosa was found in the right colon due  to portal hypertension.                           A 4 mm polyp was found in the sigmoid colon. The                            polyp was sessile. The polyp was removed with a                            cold snare. Resection and retrieval were complete.                           Internal hemorrhoids were found during                            retroflexion. The hemorrhoids were moderate.                           The exam was otherwise without abnormality. Complications:            No immediate complications. Estimated blood loss:                            Minimal. Estimated Blood Loss:     Estimated blood loss was minimal. Impression:               - Multiple colonic angiodysplastic lesions.                           - Congested mucosa in the right colon likely from                            portal hypertension.                           - One 4 mm polyp in  the sigmoid colon, removed with                            a cold snare. Resected and retrieved.                           - Internal hemorrhoids.                           - The examination was otherwise normal. Recommendation:           - Patient has a contact number available for                            emergencies. The signs and symptoms of potential                            delayed complications were discussed with the                            patient. Return to normal activities tomorrow.  Written discharge instructions were provided to the                            patient.                           - Resume previous diet.                           - Continue present medications.                           - Await pathology results. Remo Lipps P. [name], MD 04/22/2022 2:54:01 PM This report has been signed electronically.

## 2022-04-22 NOTE — Progress Notes (Signed)
1442 Ephedrine 10 mg given IV due to low BP, MD updated.

## 2022-04-22 NOTE — Progress Notes (Signed)
Pt's states no medical or surgical changes since previsit or office visit. 

## 2022-04-22 NOTE — Patient Instructions (Signed)
Information on polyps, hemorrhoids and gastritis given to you today.  Await pathology results.  Resume previous diet and medications.  Repeat EGD in 1-2 years for surveillance.    YOU HAD AN ENDOSCOPIC PROCEDURE TODAY AT [facility]:   Refer to the procedure report that was given to you for any specific questions about what was found during the examination.  If the procedure report does not answer your questions, please call your gastroenterologist to clarify.  If you requested that your care partner not be given the details of your procedure findings, then the procedure report has been included in a sealed envelope for you to review at your convenience later.  YOU SHOULD EXPECT: Some feelings of bloating in the abdomen. Passage of more gas than usual.  Walking can help get rid of the air that was put into your GI tract during the procedure and reduce the bloating. If you had a lower endoscopy (such as a colonoscopy or flexible sigmoidoscopy) you may notice spotting of blood in your stool or on the toilet paper. If you underwent a bowel prep for your procedure, you may not have a normal bowel movement for a few days.  Please Note:  You might notice some irritation and congestion in your nose or some drainage.  This is from the oxygen used during your procedure.  There is no need for concern and it should clear up in a day or so.  SYMPTOMS TO REPORT IMMEDIATELY:  Following lower endoscopy (colonoscopy or flexible sigmoidoscopy):  Excessive amounts of blood in the stool  Significant tenderness or worsening of abdominal pains  Swelling of the abdomen that is new, acute  Fever of 100F or higher  Following upper endoscopy (EGD)  Vomiting of blood or coffee ground material  New chest pain or pain under the shoulder blades  Painful or persistently difficult swallowing  New shortness of breath  Fever of 100F or higher  Black, tarry-looking stools  For urgent or emergent  issues, a gastroenterologist can be reached at any hour by calling [phone]. Do not use MyChart messaging for urgent concerns.    DIET:  We do recommend a small meal at first, but then you may proceed to your regular diet.  Drink plenty of fluids but you should avoid alcoholic beverages for 24 hours.  ACTIVITY:  You should plan to take it easy for the rest of today and you should NOT DRIVE or use heavy machinery until tomorrow (because of the sedation medicines used during the test).    FOLLOW UP: Our staff will call the number listed on your records the next business day following your procedure.  We will call around 7:15- 8:00 am to check on you and address any questions or concerns that you may have regarding the information given to you following your procedure. If we do not reach you, we will leave a message.  If you develop any symptoms (ie: fever, flu-like symptoms, shortness of breath, cough etc.) before then, please call [phone].  If you test positive for Covid 19 in the 2 weeks post procedure, please call and report this information to [place].    If any biopsies were taken you will be contacted by phone or by letter within the next 1-3 weeks.  Please call us at [phone] if you have not heard about the biopsies in 3 weeks.    SIGNATURES/CONFIDENTIALITY: You and/or your care partner have signed paperwork which will be entered into your electronic  medical record.  These signatures attest to the fact that that the information above on your After Visit Summary has been reviewed and is understood.  Full responsibility of the confidentiality of this discharge information lies with you and/or your care-partner.  

## 2022-04-23 ENCOUNTER — Telehealth: Payer: Self-pay | Admitting: *Deleted

## 2022-04-23 NOTE — Telephone Encounter (Signed)
Attempt for follow up phone call. No answer at number given.  Left message on voicemail.   

## 2022-05-12 ENCOUNTER — Other Ambulatory Visit: Payer: Self-pay | Admitting: Psychiatry

## 2022-05-12 ENCOUNTER — Other Ambulatory Visit: Payer: Self-pay | Admitting: Internal Medicine

## 2022-05-12 DIAGNOSIS — F5105 Insomnia due to other mental disorder: Secondary | ICD-10-CM

## 2022-07-09 ENCOUNTER — Ambulatory Visit (INDEPENDENT_AMBULATORY_CARE_PROVIDER_SITE_OTHER): Payer: Medicare Other | Admitting: Psychiatry

## 2022-07-09 DIAGNOSIS — F331 Major depressive disorder, recurrent, moderate: Secondary | ICD-10-CM

## 2022-07-09 NOTE — Progress Notes (Signed)
[facility] Counselor/Therapist Progress Note  Patient ID: Cody Philmore Jaxx Huish., MRN: [id number],    Date: 07/09/2022  Time Spent: 55 minutes   Treatment Type: Individual Therapy  Reported Symptoms: anxiety, frustration, depression  Mental Status Exam:  Appearance:   Casual     Behavior:  Appropriate, Sharing, and Motivated  Motor:  Some slowness in his walking  Speech/Language:   Clear and Coherent  Affect:  Depressed and anxious  Mood:  anxious and depressed  Thought process:  goal directed  Thought content:    Some overthinking  Sensory/Perceptual disturbances:    WNL  Orientation:  oriented to person, place, time/date, situation, day of week, month of year, year, and stated date of Oct. 4, 2023  Attention:  Fair  Concentration:  Good and Fair  Memory:  Some short term memory issues and Dr is aware  Fund of knowledge:   Good  Insight:    Fair  Judgment:   Good and Fair  Impulse Control:  Good   Risk Assessment: Danger to Self:  No Self-injurious Behavior: No Danger to Others: No Duty to Warn:no Physical Aggression / Violence:No  Access to Firearms a concern: No  Gang Involvement:No   Subjective:  Patient in today reporting "some better" but depressed, anxious, and frustrated when "things don't go right".  Mood does seem some better today. Not as overly focused on negatives. Affect more full and smiling a little more often. Dressed neatly today and commented on his color coordination, seemed to be proud of this which is a positive for patient. Did share and process some doubts and frustrations re: family and former marital relationship. Processed some doubts (current and old) and working on not getting stuck in past disappointments. "Some better mentally but not physically."  Finding he has more physical aches and pains "the older I get". Some back problems. "But I try sometimes to be positive about things but also get depressed some." Shared some of the things  that tend to depress him, "mostly things he can't change re: family, illness, aging." Was also able to refocus on some positives. Did attend a family birthday even that included his ex-wife and reports "it actually went ok." Denies any SI.   Interventions: Cognitive Behavioral Therapy and Ego-Supportive  Long term goal: Develop healthy cognitive patterns and beliefs about self and the world that lead to alleviation and help prevent relapse of depression. Short term goal: Identify and replace depressive thinking that leads to depressive feelings and actions.  He will replace the depressive thinking with more positive, hopeful thoughts that can lead to more positive and hopeful feelings. Strategy: Patient to more consistently monitor his thoughts and catch the depressive/negative thoughts, trying to change them to positive and more reality-based thought patterns. Look at his negative thoughts and weigh them against the evidence.   Diagnosis:   ICD-10-CM   1. Major depressive disorder, recurrent episode, moderate ([place])  F33.1      Plan:  Patient in today and showing good participation and motivation in session focusing on his depression and anxiety mostly related to personal and family issues, particularly his difficult past with separating from his wife. Patient attended 2 different family events recently where he and ex-wife were both present and patient reports it went fine without incident, which as he noted, is something that indicates some real growth and "letting go" on both of their parts in order to make this happen as there has been some long-term resentment  and anger over the years.  Discussed how this was some real progress for patient.  He came in today wearing his hearing aids and did much better in communicating in session.  Able to enjoy and seem more positive although this is a work in progress.  Does not seem to be caring as much resentment as previously, which is also a work in  progress and encouraging to see patient having some better times, especially with family.  Encouraged him to continue focusing more on the positives versus negatives and to take advantage of times to be around family as well as other people including his church friends that he has become a little closer to recently. Encouraged patient in more positive behaviors and outlook including staying in contact with people who care about him and are supportive, reaching out to family and initiating interactions with adult children and grandchildren and sister, using his cane when needed for stability and walking per Dr. Recommendation, consistent encouraging self talk as discussed with examples in session, getting outside daily as he is able with doctor approval, staying focused on the present and what he can change versus cannot, focus on enjoying situations more currently rather than going back and focusing on negative issues and situations from the past, stay on his prescribed medication, take advantage of opportunities he has to be with other people including church/community/family, and recognize the strength he shows working with goal directed behaviors to move forward in a direction that supports his improved emotional health and overall outlook.  Goal review and progress/challenges noted with patient.  Next appointment within 1 month.  This record has been created using [employer].  Chart creation errors have been sought, but may not always have been located and corrected.  Such creation errors do not reflect on the standard of medical care provided.   Shanon Ace, LCSW

## 2022-07-23 ENCOUNTER — Encounter: Payer: Self-pay | Admitting: Internal Medicine

## 2022-07-23 NOTE — Patient Instructions (Signed)

## 2022-07-23 NOTE — Progress Notes (Unsigned)
Future Appointments  Date Time Provider Department  07/24/2022                       6 mo ov  9:30 AM [name], MD [facility]  08/13/2022  8:00 AM Shanon Ace, LCSW [facility]  10/09/2022  9:30 AM Cottle, Billey Co., MD [facility]  11/17/2022 10:00 AM Ward Givens, NP [facility]  01/15/2023                         cpe 10:00 AM [name], MD [facility]    History of Present Illness:       This very nice 76 y.o. SepWM  presents for 6 month follow up with HTN, HLD, Pre-Diabetes and Vitamin D Deficiency. Patient is followed by Dr Dohmeier & Ward Givens, NP  for mild cognitive impairment and OSA on CPAP.   Patient is still in psych therapy with Dr Clovis Pu for counseling for Major Depression consequent of his marital separation.        Patient is treated for HTN  (1993)   & BP has been controlled at home. Today's BP is at goal - 138/78. 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  Patient has had no complaints of any cardiac type chest pain, palpitations, dyspnea Vertell Limber /PND, dizziness, claudication or dependent edema.       Hyperlipidemia is controlled with diet & meds. Patient denies myalgias or other med SE's. Last Lipids were at goal :  Lab Results  Component Value Date   CHOL 137 01/09/2022   HDL 57 01/09/2022   LDLCALC 64 01/09/2022   TRIG 75 01/09/2022   CHOLHDL 2.4 01/09/2022    Also, the patient has history of PreDiabetes (A1c 6.0% /2011) and has had no symptoms of reactive hypoglycemia, diabetic polys, paresthesias or visual blurring.  Last A1c was at goal :  Lab Results  Component Value Date   HGBA1C 5.5 01/09/2022                                                        Further, the patient also has history of Vitamin D Deficiency ("29" /2008) and supplements vitamin D . Last vitamin D was  low (goal 70-100) :  Lab Results  Component Value Date   VD25OH 39 01/09/2022     Current Outpatient Medications on File Prior to Visit  Medication Sig    acetaminophen 650 MG CR tablet Take every 8 (eight) hours as needed for pain (or back pain or migraines).    aspirin EC 81 MG tablet Take daily.   enalapril  20 MG tablet TAKE 1 TABLET  DAILY    lactulose 10 GM/15ML solution Take 15 mL up to 3 times daily   LORazepam 1 MG tablet TAKE 1 TABLET   AT BEDTIME    methocarbamol 500 MG tablet TAKE 1 TABLET EVERY 6 HOURS AS NEEDED    Multiple Vitamin  Take 1 capsule  daily.   rosuvastatin 40 MG tablet Take  1 tablet  Daily     sertraline  100 MG tablet Take 1 tablet in the morning and at bedtime.      Allergies  Allergen Reactions   Asa [Aspirin] Diarrhea, Nausea Only and Other (See Comments)  History of ulcers, also   Atorvastatin Other (See Comments)    Muscle aches   Celebrex [Celecoxib] Other (See Comments)    Headaches    Codeine Nausea And Vomiting   Savella [Milnacipran Hcl] Other (See Comments)    Reaction not recalled   Viagra [Sildenafil Citrate] Other (See Comments)    Headaches   Penicillins Rash     PMHx:   Past Medical History:  Diagnosis Date   BPH (benign prostatic hyperplasia)    Fibromyalgia    GERD (gastroesophageal reflux disease)    Hyperlipidemia    Hypertension    Hypogonadism male    IBS (irritable bowel syndrome)    OSA (obstructive sleep apnea)      Immunization History  Administered Date(s) Administered   PFIZER-SARS-COV-2 Vacc 11/19/2019, 12/11/2019   Pneumococcal -13 09/17/2018   Pneumococcal -23 09/26/2020   Pneumococcal - 23 07/16/2011   Tdap 01/13/2010   Zoster, Live 08/09/2013     Past Surgical History:  Procedure Laterality Date    nasal smr np3  1985   CATARACT EXTRACTION, BILATERAL Bilateral 2021   Dr. Katy Fitch, L 5/27, R 3/25   COLONOSCOPY     KNEE ARTHROSCOPY Left 1999   SKIN CANCER EXCISION  2020   nose   SPINE SURGERY  2007   L5 S 1 Disk   SPINE SURGERY  09/2019   Compression fracture stabilization by Dr. Arnoldo Morale   VASECTOMY  1983    FHx:    Reviewed /  unchanged  SHx:    Reviewed / unchanged   Systems Review:  Constitutional: Denies fever, chills, wt changes, headaches, insomnia, fatigue, night sweats, change in appetite. Eyes: Denies redness, blurred vision, diplopia, discharge, itchy, watery eyes.  ENT: Denies discharge, congestion, post nasal drip, epistaxis, sore throat, earache, hearing loss, dental pain, tinnitus, vertigo, sinus pain, snoring.  CV: Denies chest pain, palpitations, irregular heartbeat, syncope, dyspnea, diaphoresis, orthopnea, PND, claudication or edema. Respiratory: denies cough, dyspnea, DOE, pleurisy, hoarseness, laryngitis, wheezing.  Gastrointestinal: Denies dysphagia, odynophagia, heartburn, reflux, water brash, abdominal pain or cramps, nausea, vomiting, bloating, diarrhea, constipation, hematemesis, melena, hematochezia  or hemorrhoids. Genitourinary: Denies dysuria, frequency, urgency, nocturia, hesitancy, discharge, hematuria or flank pain. Musculoskeletal: Denies arthralgias, myalgias, stiffness, jt. swelling, pain, limping or strain/sprain.  Skin: Denies pruritus, rash, hives, warts, acne, eczema or change in skin lesion(s). Neuro: No weakness, tremor, incoordination, spasms, paresthesia or pain. Psychiatric: Denies confusion, memory loss or sensory loss. Endo: Denies change in weight, skin or hair change.  Heme/Lymph: No excessive bleeding, bruising or enlarged lymph nodes.  Physical Exam  BP 138/78   Pulse 88   Temp 97.8 F (36.6 C)   Resp 16   Ht '6\' 1"'$  (1.854 m)   Wt 207 lb 3.2 oz (94 kg)   SpO2 98%   BMI 27.34 kg/m   Appears  well nourished, well groomed  and in no distress.  Eyes: PERRLA, EOMs, Aphakia-ou, conjunctiva -no swelling or erythema. Disc's flat Sinuses: No frontal/maxillary tenderness ENT/Mouth: EAC's clear, TM's nl w/o erythema, bulging. Nares clear w/o erythema, swelling, exudates. Oropharynx clear without erythema or exudates. Oral hygiene is good. Tongue normal, non  obstructing. Hearing intact.  Neck: Supple. Thyroid not palpable. Car 2+/2+ without bruits, nodes or JVD. Chest: Respirations nl with BS clear & equal w/o rales, rhonchi, wheezing or stridor.  Cor: Heart sounds normal w/ regular rate and rhythm without sig. murmurs, gallops, clicks or rubs. Peripheral pulses normal and equal  without edema.  Abdomen: Soft &  bowel sounds normal. Non-tender w/o guarding, rebound, hernias, masses or organomegaly.  Lymphatics: Unremarkable.  Musculoskeletal: Full ROM all peripheral extremities, joint stability, 5/5 strength and normal gait.  Skin: Warm, dry without exposed rashes, lesions or ecchymosis apparent.  Neuro: Cranial nerves intact, reflexes equal bilaterally. Sensory-motor testing grossly intact. Tendon reflexes grossly intact.  Pysch: Alert & oriented x 3.  Insight and judgement nl & appropriate. No ideations.  Assessment and Plan:  1. Essential hypertension  - Continue medication, monitor blood pressure at home.  - Continue DASH diet.  Reminder to go to the ER if any CP,  SOB, nausea, dizziness, severe HA, changes vision/speech.   - CBC with Differential/Platelet - COMPLETE METABOLIC PANEL WITH GFR - Magnesium - TSH  2. Hyp - Continue diet/meds, exercise,& lifestyle modifications.  - Continue monitor periodic cholesterol/liver & renal functions   erlipidemia, mixed  - Lipid panel - TSH  3. Abnormal glucose  - Continue diet, exercise  - Lifestyle modifications.  - Monitor appropriate labs   - Hemoglobin A1c - Insulin, random  4. Vitamin D deficiency  - Continue supplementation   - VITAMIN D 25 Hydroxy   5. Stage 3a chronic kidney disease (HCC)  - COMPLETE METABOLIC PANEL WITH GFR  6. Medication management  - CBC with Differential/Platelet - COMPLETE METABOLIC PANEL WITH GFR - Magnesium - Lipid panel - TSH - Hemoglobin A1c - Insulin, random - VITAMIN D 25 Hydroxy          Discussed  regular exercise, BP  monitoring, weight control to achieve/maintain BMI less than 25 and discussed med and SE's. Recommended labs to assess /monitor clinical status .  I discussed the assessment and treatment plan with the patient. The patient was provided an opportunity to ask questions and all were answered. The patient agreed with the plan and demonstrated an understanding of the instructions.  I provided over 30 minutes of exam, counseling, chart review and  complex critical decision making.        The patient was advised to call back or seek an in-person evaluation if the symptoms worsen or if the condition fails to improve as anticipated.   Kirtland Bouchard, MD

## 2022-07-24 ENCOUNTER — Ambulatory Visit: Payer: Medicare Other | Admitting: Internal Medicine

## 2022-07-24 ENCOUNTER — Encounter: Payer: Self-pay | Admitting: Internal Medicine

## 2022-07-24 VITALS — BP 138/78 | HR 88 | Temp 97.8°F | Resp 16 | Ht 73.0 in | Wt 207.2 lb

## 2022-07-24 DIAGNOSIS — R7309 Other abnormal glucose: Secondary | ICD-10-CM | POA: Diagnosis not present

## 2022-07-24 DIAGNOSIS — N1831 Chronic kidney disease, stage 3a: Secondary | ICD-10-CM

## 2022-07-24 DIAGNOSIS — E782 Mixed hyperlipidemia: Secondary | ICD-10-CM

## 2022-07-24 DIAGNOSIS — E559 Vitamin D deficiency, unspecified: Secondary | ICD-10-CM | POA: Diagnosis not present

## 2022-07-24 DIAGNOSIS — I10 Essential (primary) hypertension: Secondary | ICD-10-CM | POA: Diagnosis not present

## 2022-07-24 DIAGNOSIS — Z79899 Other long term (current) drug therapy: Secondary | ICD-10-CM

## 2022-07-25 LAB — INSULIN, RANDOM: Insulin: 94.5 u[IU]/mL — ABNORMAL HIGH

## 2022-07-25 LAB — CBC WITH DIFFERENTIAL/PLATELET
Absolute Monocytes: 304 cells/uL (ref 200–950)
Basophils Absolute: 20 cells/uL (ref 0–200)
Basophils Relative: 0.5 %
Eosinophils Absolute: 90 cells/uL (ref 15–500)
Eosinophils Relative: 2.3 %
HCT: 41.6 % (ref 38.5–50.0)
Hemoglobin: 13.6 g/dL (ref 13.2–17.1)
Lymphs Abs: 1630 cells/uL (ref 850–3900)
MCH: 30.8 pg (ref 27.0–33.0)
MCHC: 32.7 g/dL (ref 32.0–36.0)
MCV: 94.1 fL (ref 80.0–100.0)
MPV: 11.3 fL (ref 7.5–12.5)
Monocytes Relative: 7.8 %
Neutro Abs: 1856 cells/uL (ref 1500–7800)
Neutrophils Relative %: 47.6 %
Platelets: 116 10*3/uL — ABNORMAL LOW (ref 140–400)
RBC: 4.42 10*6/uL (ref 4.20–5.80)
RDW: 14.5 % (ref 11.0–15.0)
Total Lymphocyte: 41.8 %
WBC: 3.9 10*3/uL (ref 3.8–10.8)

## 2022-07-25 LAB — COMPLETE METABOLIC PANEL WITH GFR
AG Ratio: 1.5 (calc) (ref 1.0–2.5)
ALT: 24 U/L (ref 9–46)
AST: 47 U/L — ABNORMAL HIGH (ref 10–35)
Albumin: 3.8 g/dL (ref 3.6–5.1)
Alkaline phosphatase (APISO): 99 U/L (ref 35–144)
BUN/Creatinine Ratio: 7 (calc) (ref 6–22)
BUN: 10 mg/dL (ref 7–25)
CO2: 27 mmol/L (ref 20–32)
Calcium: 9.3 mg/dL (ref 8.6–10.3)
Chloride: 108 mmol/L (ref 98–110)
Creat: 1.49 mg/dL — ABNORMAL HIGH (ref 0.70–1.28)
Globulin: 2.6 g/dL (calc) (ref 1.9–3.7)
Glucose, Bld: 157 mg/dL — ABNORMAL HIGH (ref 65–99)
Potassium: 4 mmol/L (ref 3.5–5.3)
Sodium: 142 mmol/L (ref 135–146)
Total Bilirubin: 1.3 mg/dL — ABNORMAL HIGH (ref 0.2–1.2)
Total Protein: 6.4 g/dL (ref 6.1–8.1)
eGFR: 49 mL/min/{1.73_m2} — ABNORMAL LOW (ref >=60)

## 2022-07-25 LAB — LIPID PANEL
Cholesterol: 124 mg/dL (ref <200)
HDL: 52 mg/dL (ref >=40)
LDL Cholesterol (Calc): 56 mg/dL (calc)
Non-HDL Cholesterol (Calc): 72 mg/dL (calc) (ref <130)
Total CHOL/HDL Ratio: 2.4 (calc) (ref <5.0)
Triglycerides: 75 mg/dL (ref <150)

## 2022-07-25 LAB — MAGNESIUM: Magnesium: 2.2 mg/dL (ref 1.5–2.5)

## 2022-07-25 LAB — TSH: TSH: 2.2 mIU/L (ref 0.40–4.50)

## 2022-07-25 LAB — HEMOGLOBIN A1C
Hgb A1c MFr Bld: 5.3 % of total Hgb (ref <5.7)
Mean Plasma Glucose: 105 mg/dL
eAG (mmol/L): 5.8 mmol/L

## 2022-07-25 LAB — VITAMIN D 25 HYDROXY (VIT D DEFICIENCY, FRACTURES): Vit D, 25-Hydroxy: 34 ng/mL (ref 30–100)

## 2022-07-25 NOTE — Progress Notes (Signed)
<><><><><><><><><><><><><><><><><><><><><><><><><><><><><><><><><> <><><><><><><><><><><><><><><><><><><><><><><><><><><><><><><><><> - Test results slightly outside the reference range are not unusual. If there is anything important, I will review this with you,  otherwise it is considered normal test values.  If you have further questions,  please do not hesitate to contact me at the office or via My Chart.  <><><><><><><><><><><><><><><><><><><><><><><><><><><><><><><><><> <><><><><><><><><><><><><><><><><><><><><><><><><><><><><><><><><>  -  Vitamin D = 34  is extremely low   - Vitamin D goal is between 70-100.   - Please take  Vitamin D 5,000 unit capsule  x 2 capsules = 10,000 units /day  ! .   - It is very important as a natural anti-inflammatory and helping the                            immune system protect against viral infections, like the Covid-19    helping hair, skin, and nails, as well as reducing stroke and heart attack risk.   - It helps your bones and helps with mood.  - It also decreases numerous cancer risks so please                                                                                                                take it as directed.   - Low Vit D is associated with a 200-300% higher risk for        CANCER   and 200-300% higher risk for HEART   ATTACK  &  STROKE.    - It is also associated with higher death rate at younger ages,   autoimmune diseases like Rheumatoid arthritis, Lupus, Multiple Sclerosis.     - Also many other serious conditions, like depression, Alzheimer's  Dementia,  Muscle aches, fatigue, fibromyalgia   <><><><><><><><><><><><><><><><><><><><><><><><><><><><><><><><><> <><><><><><><><><><><><><><><><><><><><><><><><><><><><><><><><><>  -  Total Chol = 124  -  Excellent   - Very low risk for Heart Attack  / Stroke <><><><><><><><><><><><><><><><><><><><><><><><><><><><><><><><><>  -  A1c is Normal / OK , But   Insulin = 94.5 is very high  (Normal is less than 20 !  )   And shows insulin resistance - a sign of early diabetes and associated with a   300 % greater risk for heart attacks, strokes, cancer & Alzheimer type vascular dementia   - All this can be cured  and prevented with losing weight   - get Dr Fara Olden Fuhrman's book 'the End of Diabetes" and "the End of Dieting" -   -  It is very important that you work harder with diet by                       avoiding all foods that are white except chicken, fish & calliflower.  - Avoid white rice  (brown & wild rice is OK),   - Avoid white potatoes  (sweet potatoes in moderation is OK),   White bread or wheat bread or anything made out of   white flour like bagels, donuts, rolls, buns, biscuits, cakes,  - pastries, cookies, pizza crust, and pasta (made from  white flour & egg whites)   - vegetarian pasta or spinach or wheat pasta is OK.  - Multigrain breads like Arnold's, Pepperidge Farm or                                                multigrain sandwich thins or high fiber breads like   [place] bread or "Dave's Killer" breads that are                                                                   4 to 5 grams fiber per slice !  are best.    Diet, exercise and weight loss can reverse and cure                                                                                  diabetes in the early stages.    - Diet, exercise and weight loss is very important in the   control and prevention of complications of diabetes  which                                                                   affects every system in your body, ie.   -Brain - dementia/stroke,  -  eyes - glaucoma/blindness,  - heart - heart attack/heart failure,  - kidneys - dialysis,  - stomach - gastric paralysis,  - intestines - malabsorption,  - nerves - severe painful neuritis,  - circulation - gangrene & loss of a leg(s)  - and finally  . . . . . . . . . . . . . . . . . .    - cancer and Alzheimers. <><><><><><><><><><><><><><><><><><><><><><><><><><><><><><><><><> <><><><><><><><><><><><><><><><><><><><><><><><><><><><><><><><><>

## 2022-08-13 ENCOUNTER — Ambulatory Visit (INDEPENDENT_AMBULATORY_CARE_PROVIDER_SITE_OTHER): Payer: Medicare Other | Admitting: Psychiatry

## 2022-08-13 DIAGNOSIS — F331 Major depressive disorder, recurrent, moderate: Secondary | ICD-10-CM

## 2022-08-13 NOTE — Progress Notes (Signed)
[facility] Counselor/Therapist Progress Note  Patient ID: Fabrizzio Philmore Ronie Barnhart., MRN: [id number],    Date: 08/13/2022  Time Spent: 50 minutes   Treatment Type: Individual Therapy  Reported Symptoms: depression, anxiety  Mental Status Exam:  Appearance:   Casual     Behavior:  Appropriate, Sharing, and Motivated  Motor:  Walking more slowly  Speech/Language:   Clear and Coherent  Affect:  Depressed  Mood:  anxious and depressed  Thought process:  normal  Thought content:    Some ruminating  Sensory/Perceptual disturbances:    WNL  Orientation:  oriented to person, place, time/date, situation, day of week, month of year, year, and stated date of Nov. 8, 2023  Attention:  Good  Concentration:  Good and Fair  Memory:  Some occasional  short term memory issues and states his Dr is aware  Fund of knowledge:   Good and Fair  Insight:    Good and Fair  Judgment:   Good and Fair  Impulse Control:  Good   Risk Assessment: Danger to Self:  No Self-injurious Behavior: No Danger to Others: No Duty to Warn:no Physical Aggression / Violence:No  Access to Firearms a concern: No  Gang Involvement:No   Subjective: Patient  in today reporting depression "a little stronger than my anxiety but both still here." Some reminiscing of past family life, his earlier years. Also reflecting on health issues past and present, and reports no new health concerns. "Hard to deal with things when they go wrong". Noticeable that he does get more easily aggravated by things that are out of his control. But also able to talk through some of those situations he was aggravated about, and let go of some of his frustration. Staying in contact with long-time friends locally and also some family contacts. Some tendency to focus more heavily on the negatives, and can acknowledge some positives when pointed out by someone else as noted in session today. Reflecting on past and former disappointments and  second-guessing other family member, but some less than he has in the past. Smiles appropriately occasionally. Seemed to really appreciate being here and talking through his concerns and feeling heard.  Has limited interactions with others and he is encouraged today to take advantage of more opportunities to be with others especially some of his long time friends that live locally and often invite patient to their home.  Some physical issues he states still persist and states he does follow up on any med appts he has and is using a calendar that he find helpful. Has had some family interactions that he expected to go poorly and they actually went better than patient expected, so encouraging him also "not to assume the worst." Denies any SI.  Interventions: Cognitive Behavioral Therapy and Ego-Supportive  Long term goal: Develop healthy cognitive patterns and beliefs about self and the world that lead to alleviation and help prevent relapse of depression. Short term goal: Identify and replace depressive thinking that leads to depressive feelings and actions.  He will replace the depressive thinking with more positive, hopeful thoughts that can lead to more positive and hopeful feelings. Strategy: Patient to more consistently monitor his thoughts and catch the depressive/negative thoughts, trying to change them to positive and more reality-based thought patterns. Look at his negative thoughts and weigh them against the evidence.  Diagnosis:   ICD-10-CM   1. Major depressive disorder, recurrent episode, moderate ([facility])  F33.1      Plan:  Patient in office today actively participating in session working on his depression, anxiety, and tendency to isolate himself at times, and discussed how that can exacerbate his depression and anxiety. Has attended a couple more events where ex-wife attended and there was less friction noticed which is progress. Encouraged his continued wearing of his hearing aids to  better interact with others. Will often not wear them and misses out on conversations. To follow up in working on decreasing negative thought patterns as we did in session today. Continued working on letting go of old resentments and he is noticing some improvement. Encouraged patient in his practice of more positive behaviors and outlook including: Remaining in contact with people who care about him and are supportive, reaching out more to family and initiating interactions with adult children and grandchildren and a sister, using his cane when needed for stability and walking per doctor recommendations, encouraging self talk similar to examples discussed in session, getting outside daily as he is able with Dr. Christie Beckers, staying focused on the present and what he can change versus cannot, focus on enjoying situations currently rather than going back and focusing on negative issues in situations from the past, remain on his prescribed medication, take advantage of opportunities he has to be with other people including church/community/family, and realize the strength he shows working with goal-directed behaviors to move forward in a direction that supports his improved emotional health.  Goal review and progress/challenges noted with patient.  Next appointment within 4-5 weeks.  This record has been created using [employer].  Chart creation errors have been sought, but may not always have been located and corrected.  Such creation errors do not reflect on the standard of medical care provided.   Shanon Ace, LCSW

## 2022-09-16 ENCOUNTER — Ambulatory Visit (INDEPENDENT_AMBULATORY_CARE_PROVIDER_SITE_OTHER): Payer: Medicare Other | Admitting: Psychiatry

## 2022-09-16 DIAGNOSIS — F331 Major depressive disorder, recurrent, moderate: Secondary | ICD-10-CM | POA: Diagnosis not present

## 2022-09-16 NOTE — Progress Notes (Signed)
[facility] Counselor/Therapist Progress Note  Patient ID: Daquawn Philmore Cheo Selvey., MRN: [id number],    Date: 09/16/2022  Time Spent: 50 minutes   Treatment Type: Individual Therapy  Reported Symptoms: anxiety, depression, some "holiday" related "down-ness", denies any SI  Mental Status Exam:  Appearance:   Casual     Behavior:  Appropriate, Sharing, and Motivated  Motor:  Not quite as agile, sometime uses a cane in walking and helping with balance  Speech/Language:   Clear and Coherent  Affect:  Depressed and anxious  Mood:  anxious and depressed  Thought process:  goal directed  Thought content:    Rumination  Sensory/Perceptual disturbances:    WNL  Orientation:  oriented to person, place, time/date, situation, day of week, month of year, year, and stated date of Dec. 12, 2023  Attention:  Fair  Concentration:  Fair  Memory:  Some "short term memory issues" and reports Dr is aware  Fund of knowledge:   Good and Fair  Insight:    Good and Fair  Judgment:   Good and Fair  Impulse Control:  Good   Risk Assessment: Danger to Self:  No Self-injurious Behavior: No Danger to Others: No Duty to Warn:no Physical Aggression / Violence:No  Access to Firearms a concern: No  Gang Involvement:No   Subjective: Patient in today with symptoms of anxiety and depression, and some "holiday down-ness".  "Hard to not reflect and ruminate on past hurtful issues within his marriage and family life. Dealing with health issues "but no new problems". Sleep is sometimes off and on. Reflecting often on his past, sharing some of past and present "but try not to dwell on all the negatives, which remains challenging for patient." Struggles with things out of his control. Continues to stay in touch with friends outside of the family. Sometimes hard to stay on subject in conversation. Difficulty when he's been with family and when "its time to leave I feel sad", some related to restrictions from  a couple of family members re: patient not driving with kids in his car due to some prior driving concerns. Talked through this some today as well as looking at some of the positives for patient. Is seeing family and former wife present but "not much talking nor interaction with her." Talked about how holidays can be difficult and will be seeing family at Christmas. Also has plans with a married couple who are long time friends as they've invited him over during week of Christmas. Continues to "take my meds right" and has them organized. Denies SI.  Interventions: Cognitive Behavioral Therapy, Solution-Oriented/Positive Psychology, and Ego-Supportive  Long term goal: Develop healthy cognitive patterns and beliefs about self and the world that lead to alleviation and help prevent relapse of depression. Short term goal: Identify and replace depressive thinking that leads to depressive feelings and actions.  He will replace the depressive thinking with more positive, hopeful thoughts that can lead to more positive and hopeful feelings. Strategy: Patient to more consistently monitor his thoughts and catch the depressive/negative thoughts, trying to change them to positive and more reality-based thought patterns. Look at his negative thoughts and weigh them against the evidence.  Diagnosis:   ICD-10-CM   1. Major depressive disorder, recurrent episode, moderate ([place])  F33.1      Plan: Patient today needing the session time to process his anxiety and depression with depression being a little more prominent here and holiday time and patient acknowledged that he was  having "holiday down-ness".  Ruminating some about the past including issues former wife but also able to eventually reflect some on the positives of how he does get to stay in touch with his adult kids and grandchildren.  Has a difficult time excepting some of the limits on grandchildren riding in the car as one of his adult children especially  has concerns about his driving.  Does have plans coming up both with family and with longtime friends, during the holiday season.  Looked at the things and people in his life that encouraged him including some family members and his sister but also several longtime friends, some of whom are aging also with health issues.  Some continued work on old resentments that are hard to let go of. Is looking forward to being with family.  Encouraged his wearing of his hearing aids as he wore them today and can definitely hear interact better allowing him to be more fully participate in events and conversations. Encourage patient and practicing more positive behaviors including: Staying in contact with people who care about him and are supportive, initiate interactions with adult children and grandchildren and a sister, using his cane when needed for stability and per doctor recommendations, encouraging self talk similar to examples discussed in session, getting outside daily as he is able with doctor approval, staying focused in the present and what he can change versus cannot, focus on enjoying situations currently rather than going back and focusing on negative issues and situations from the past, stay on his prescribed medication, take advantage of opportunities he has to be with other people including church/community/family, and recognize the strength he shows working with goal-directed behaviors to move forward in a direction that supports his improved emotional health and overall outlook.   Goal review and progress/challenges noted with patient.  Next appointment within 4 weeks.  This record has been created using [employer].  Chart creation errors have been sought, but may not always have been located and corrected.  Such creation errors do not reflect on the standard of medical care provided.   Shanon Ace, LCSW

## 2022-10-02 ENCOUNTER — Other Ambulatory Visit: Payer: Self-pay

## 2022-10-02 DIAGNOSIS — R7989 Other specified abnormal findings of blood chemistry: Secondary | ICD-10-CM

## 2022-10-02 DIAGNOSIS — D696 Thrombocytopenia, unspecified: Secondary | ICD-10-CM

## 2022-10-09 ENCOUNTER — Ambulatory Visit (INDEPENDENT_AMBULATORY_CARE_PROVIDER_SITE_OTHER): Payer: Medicare Other | Admitting: Psychiatry

## 2022-10-09 ENCOUNTER — Encounter: Payer: Self-pay | Admitting: Psychiatry

## 2022-10-09 DIAGNOSIS — F5105 Insomnia due to other mental disorder: Secondary | ICD-10-CM | POA: Diagnosis not present

## 2022-10-09 DIAGNOSIS — F411 Generalized anxiety disorder: Secondary | ICD-10-CM

## 2022-10-09 DIAGNOSIS — F331 Major depressive disorder, recurrent, moderate: Secondary | ICD-10-CM

## 2022-10-09 DIAGNOSIS — F6381 Intermittent explosive disorder: Secondary | ICD-10-CM

## 2022-10-09 DIAGNOSIS — F1021 Alcohol dependence, in remission: Secondary | ICD-10-CM

## 2022-10-09 MED ORDER — SERTRALINE HCL 100 MG PO TABS: 100.0000 mg | ORAL_TABLET | Freq: Two times a day (BID) | ORAL | 3 refills | Status: DC

## 2022-10-09 NOTE — Progress Notes (Signed)
Sabastien Philmore Bentley Fissel. [id number] 05/24/1946 77 y.o.  Subjective:   Patient ID:  Koree Philmore Shashank Kwasnik. is a 77 y.o. (DOB 26-Apr-1946) male.  Chief Complaint:  Chief Complaint  Patient presents with   Follow-up   Depression   Anxiety    Depression        Associated symptoms include fatigue and headaches.  Associated symptoms include no decreased concentration and no suicidal ideas.  Past medical history includes anxiety.   Anxiety Symptoms include nervous/anxious behavior. Patient reports no confusion, decreased concentration, palpitations or suicidal ideas.      Roderic Philmore Hollis Tuller. presents to the office today for follow-up mood problems.  visit 11/21/19.  No med changes at last visit.  Still on sertraline 200 mg daily.   He's continued counseling.  05/21/20 appt with the following noted: Taking lorazepam 3-4 times weekly for sleep. About the same mentally, but physically back pain and can't do much and not much stamina from the fall he had before.  Fell off ladder while fixing downspout at wife's parents house and fx vertebra and wife took him back to care for him.   95% of time trying to get along with him.  Not losing his temper with her generally.    Able to see gkids more generally except for Covid with son.  Pt been vaccinated but still not good enough for son to see the gkids.   verall is improved but sx are not resolved. Plan no med changes  01/21/21 appt noted: Much better.  PT for 2 mos [facility] to deal with falls. Gotten better Still on sertraline 200 and lorazepam prn.  It is working and satisfied with the meds.  Mood is better.  Some memory problems but it is not markedly worse than when here.  Pending neuropsych testing. Pt reports that mood is pretty even keel.  Things don't bother him like they used to.  Wife barely speaks to him since March. Anxiety in large groups of people has to get out.  Pt reports has interrupted sleep. Better sleep when  not working. Goes to bed early. Trouble staying asleep.  Busy.  sUsually enough sleep.  Pt reports that appetite is good. Pt reports that energy is poor DT heat. Better than it was.   Concentration is air.. Suicidal thoughts:  denied by patient.  Likes movies.   Less angry and resentful of others than he used to be. Outbursts are generally resolved. He believes he's handling anger better.  Kids see big difference in the way he handles things. Plan continue to new sertraline 200 mg daily and lorazepam as needed  09/24/2021 appointment with the following noted: [name] mower accident and trapped and broken back and hospitalized a week and 4 weeks of therapy. Can't get comfortable and pain awakens him often.  Just got Robaxin.  Not taking anything for sleep. House bound right now DT injuries. Over all mood is ok under the circumstances.  No SI.  Anxiety manageable. Satisfied with meds and no SE Plan: No med changes indicated.  03/25/22 appt noted: Still stiffness and weakness in back and can't stand for long. Can't help but worry bc cirrhosis dx.  Not drinking for 5 years.  May have had hepatitis in the past. Lives alone.   Was doing pretty well emotionally until last month wife came in wanting to split up the property and divorce.  She wants to buy house in [place] and be near gkids. She says she doesn't  see any change in him but other family and kids say he is different.  Son is supportive.  Tends to stay away from wife.  Son says he's done all he can do to please wife. No SE Ativan 1 weekly.  10/09/22 appt noted: Christmas hard but otherwise good and bad days. Chronic back pain.  On good day can get out and work half day but the next day laid up and spent and don't feel like doing much .  Not motivated to do a lot. Has back spasms.  They can interfere with sleep. Not often with lorazepam.   Not really anger outbursts.  Concentrated on not letting that get out of hand. Son is active.  Therapy  with [name] CSW helpful. Wife living with daughter.  Drinks caffeinated sodas all day until 9pm.  Sober  in OCT 2018.   Past Psychiatric Medication Trials: citalopram, Wellbutrin, sertraline 200,   Xanax, lorazepam.  Review of Systems:  Review of Systems  Constitutional:  Positive for fatigue.  Cardiovascular:  Negative for palpitations.  Musculoskeletal:  Positive for back pain, gait problem and neck pain.  Neurological:  Positive for tremors and headaches. Negative for weakness.       Fewer falls.  Psychiatric/Behavioral:  Negative for agitation, behavioral problems, confusion, decreased concentration, dysphoric mood, hallucinations, self-injury, sleep disturbance and suicidal ideas. The patient is nervous/anxious. The patient is not hyperactive.   Increase in migraine HA.  Medications: I have reviewed the patient's current medications.  Current Outpatient Medications  Medication Sig Dispense Refill   acetaminophen (TYLENOL) 650 MG CR tablet Take 650 mg by mouth every 8 (eight) hours as needed for pain (or back pain or migraines).      aspirin EC 81 MG tablet Take 81 mg by mouth daily.     enalapril (VASOTEC) 20 MG tablet TAKE 1 TABLET BY MOUTH  DAILY FOR BLOOD PRESSURE 120 tablet 2   lactulose (CHRONULAC) 10 GM/15ML solution Take 15 mL up to 3 times daily, goal is to have 2-3 bowel movements daily. 1800 mL 1   LORazepam (ATIVAN) 1 MG tablet TAKE 1 TABLET BY MOUTH EACH NIGHT AT BEDTIME FOR SLEEP 30 tablet 3   methocarbamol (ROBAXIN) 500 MG tablet TAKE 1 TABLET BY MOUTH EVERY 6 HOURS AS NEEDED FOR MUSCLE SPASMS 20 tablet 0   Multiple Vitamin (MULTIVITAMIN) capsule Take 1 capsule by mouth daily.     rosuvastatin (CRESTOR) 40 MG tablet Take  1 tablet  Daily  for Cholesterol 90 tablet 3   sertraline (ZOLOFT) 100 MG tablet Take 1 tablet (100 mg total) by mouth in the morning and at bedtime. 180 tablet 3   No current facility-administered medications for this visit.    Medication  Side Effects: None  Allergies:  Allergies  Allergen Reactions   Asa [Aspirin] Diarrhea, Nausea Only and Other (See Comments)    History of ulcers, also   Atorvastatin Other (See Comments)    Muscle aches   Celebrex [Celecoxib] Other (See Comments)    Headaches    Codeine Nausea And Vomiting   Savella [Milnacipran Hcl] Other (See Comments)    Reaction not recalled   Viagra [Sildenafil Citrate] Other (See Comments)    Headaches   Penicillins Rash    Past Medical History:  Diagnosis Date   BPH (benign prostatic hyperplasia)    Fibromyalgia    GERD (gastroesophageal reflux disease)    Hyperlipidemia    Hypertension    Hypogonadism male  IBS (irritable bowel syndrome)    OSA (obstructive sleep apnea)     Family History  Problem Relation Age of Onset   Cancer Mother        breast   Cancer Father        lung   Diabetes Father    Heart disease Sister    Arthritis Sister    Colon cancer Neg Hx    Colon polyps Neg Hx     Social History   Socioeconomic History   Marital status: Divorced    Spouse name: Kennyth Lose   Number of children: Not on file   Years of education: Not on file   Highest education level: Not on file  Occupational History   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  Tobacco Use   Smoking status: Never   Smokeless tobacco: Never  Vaping Use   Vaping Use: Never used  Substance and Sexual Activity   Alcohol use: Not Currently    Comment: Not drinking x 1 month   Drug use: Never   Sexual activity: Not Currently  Other Topics Concern   Not on file  Social History Narrative   ** Merged History Encounter **       Social Determinants of Health   Financial Resource Strain: Not on file  Food Insecurity: Not on file  Transportation Needs: Not on file  Physical Activity: Not on file  Stress: Not on file  Social Connections: Not on file  Intimate Partner Violence: Not on file    Past Medical History, Surgical history, Social history, and Family history were  reviewed and updated as appropriate.   Please see review of systems for further details on the patient's review from today.   Objective:   Physical Exam:  There were no vitals taken for this visit.  Physical Exam Neurological:     Mental Status: He is alert and oriented to person, place, and time.     Cranial Nerves: No dysarthria.     Gait: Gait abnormal.  Psychiatric:        Attention and Perception: Attention and perception normal.        Mood and Affect: Mood is anxious and depressed.        Speech: Speech normal.        Behavior: Behavior is cooperative.        Thought Content: Thought content normal. Thought content is not paranoid or delusional. Thought content does not include homicidal or suicidal ideation. Thought content does not include suicidal plan.        Cognition and Memory: Cognition and memory normal.        Judgment: Judgment normal.     Comments: Insight intact Mild anxiety and dysphoria and no worse. Talkative without pressure     Lab Review:     Component Value Date/Time   NA 142 07/24/2022 0000   K 4.0 07/24/2022 0000   CL 108 07/24/2022 0000   CO2 27 07/24/2022 0000   GLUCOSE 157 (H) 07/24/2022 0000   BUN 10 07/24/2022 0000   CREATININE 1.49 (H) 07/24/2022 0000   CALCIUM 9.3 07/24/2022 0000   PROT 6.4 07/24/2022 0000   ALBUMIN 3.7 03/17/2022 1140   AST 47 (H) 07/24/2022 0000   ALT 24 07/24/2022 0000   ALKPHOS 81 03/17/2022 1140   BILITOT 1.3 (H) 07/24/2022 0000   GFRNONAA 52 (L) 06/25/2021 0341   GFRNONAA 49 (L) 01/09/2021 0959   GFRAA 57 (L) 01/09/2021 0959       Component  Value Date/Time   WBC 3.9 07/24/2022 0000   RBC 4.42 07/24/2022 0000   HGB 13.6 07/24/2022 0000   HCT 41.6 07/24/2022 0000   PLT 116 (L) 07/24/2022 0000   MCV 94.1 07/24/2022 0000   MCH 30.8 07/24/2022 0000   MCHC 32.7 07/24/2022 0000   RDW 14.5 07/24/2022 0000   LYMPHSABS 1,630 07/24/2022 0000   MONOABS 0.5 03/17/2022 1140   EOSABS 90 07/24/2022 0000    BASOSABS 20 07/24/2022 0000    No results found for: "POCLITH", "LITHIUM"   No results found for: "PHENYTOIN", "PHENOBARB", "VALPROATE", "CBMZ"   .res Assessment: Plan:    Chrles was seen today for follow-up, depression and anxiety.  Diagnoses and all orders for this visit:  Major depressive disorder, recurrent episode, moderate (HCC) -     sertraline (ZOLOFT) 100 MG tablet; Take 1 tablet (100 mg total) by mouth in the morning and at bedtime.  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  Generalized anxiety disorder -     sertraline (ZOLOFT) 100 MG tablet; Take 1 tablet (100 mg total) by mouth in the morning and at bedtime.  Insomnia due to mental condition  Alcohol dependence, in remission ([place])   Greater than 50% of 30 min face to face time with patient was spent on counseling and coordination of care. We discussed his chronic depression and anxiety and irritability, anger.  He's markedly better but has residual symptoms.  Most of mood problem is situational.   Benefit from meds and therapy. No change in therapy indicated.  Needs to continue the meds bc of a high relapse risk.  Supportive therapy re: dealing with stress of separation. Doesn't look like things will change.  And also disc recent health concern  Disc recent dx hepatittis and cirrhosis and mental health effects of high ammonia levels.  Option potentiate with something but defer.  Not likely to help bc it appears mostly situational.   Supportive therapy trying to enjoy the time with gkids.  Disc caffeine and sleep.  Takes it up to 9 pm.  Rec avoid late caffeine.  We discussed the short-term risks associated with benzodiazepines including sedation and increased fall risk among others.  Discussed long-term side effect risk including dependence, potential withdrawal symptoms, and the potential eventual  dose-related risk of dementia.  No evidence of abuse.Take LED lorazepam.  Used less since here.  Continue sertraline 200.  Disc sobriety maintenance.   Benefit from therapy.  No med changes indicated.  FU 12 mos.  Lynder Parents, MD, DFAPA   Please see After Visit Summary for patient specific instructions.  Future Appointments  Date Time Provider [place]  10/14/2022 10:00 AM [facility] 1 [facility] [place]  10/30/2022  8:00 AM Shanon Ace, LCSW [facility] None  11/04/2022 10:30 AM Darrol Jump, NP [facility] None  11/17/2022 10:00 AM Ward Givens, NP [facility] None  03/04/2023 10:00 AM [name], MD [facility] None    No orders of the defined types were placed in this encounter.     -------------------------------

## 2022-10-14 ENCOUNTER — Ambulatory Visit (HOSPITAL_COMMUNITY): Admission: RE | Admit: 2022-10-14 | Payer: Medicare Other | Source: Ambulatory Visit

## 2022-10-15 ENCOUNTER — Ambulatory Visit (HOSPITAL_COMMUNITY)
Admission: RE | Admit: 2022-10-15 | Discharge: 2022-10-15 | Disposition: A | Discharge status: Home / Self Care | Payer: Medicare Other | Source: Ambulatory Visit | Attending: Physician Assistant | Admitting: Physician Assistant

## 2022-10-15 DIAGNOSIS — D696 Thrombocytopenia, unspecified: Secondary | ICD-10-CM | POA: Diagnosis present

## 2022-10-15 DIAGNOSIS — R7989 Other specified abnormal findings of blood chemistry: Secondary | ICD-10-CM | POA: Diagnosis present

## 2022-10-30 ENCOUNTER — Ambulatory Visit (INDEPENDENT_AMBULATORY_CARE_PROVIDER_SITE_OTHER): Payer: Medicare Other | Admitting: Psychiatry

## 2022-10-30 DIAGNOSIS — F331 Major depressive disorder, recurrent, moderate: Secondary | ICD-10-CM

## 2022-10-30 NOTE — Progress Notes (Signed)
[facility] Counselor/Therapist Progress Note  Patient ID: Cody Hines., MRN: [id number],    Date: 10/30/2022  Time Spent: 50 minutes   Treatment Type: Individual Therapy  Reported Symptoms: depression, anxiety  Mental Status Exam:  Appearance:   Casual     Behavior:  Appropriate, Sharing, and Motivated  Motor:  Walking is slower  Speech/Language:   Clear and Coherent  Affect:  Depressed and anxious  Mood:  anxious and depressed  Thought process:  goal directed  Thought content:    Some obsessive thoughts and rumination  Sensory/Perceptual disturbances:    WNL  Orientation:  oriented to person, place, time/date, situation, day of week, month of year, year, and stated date of Jan. 25, 2024  Attention:  Good  Concentration:  Fair  Memory:  Some short term issues and Dr is aware  Fund of knowledge:   Good and Fair  Insight:    Good and Fair  Judgment:   Good  Impulse Control:  Good   Risk Assessment: Danger to Self:  No Self-injurious Behavior: No Danger to Others: No Duty to Warn:no Physical Aggression / Violence:No  Access to Firearms a concern: No  Gang Involvement:No   Subjective: Patient in today reporting depression and anxiety mostly related to being alone a lot, but doesn't particularly like getting out much. Family that he is closer to live out of town and he finds it difficult not seeing them as much. Difficult to accept that family have "busy schedules" and are not able to get together quite as often as patient would like, but it does sound like family makes intentional efforts to be with patient. When he reflected back on recent holidays, he did recall a little more contact with family including an extra trip up by his daughter at Christmas. Stated sleep is some ok some nights and other nights "not so good". Dreams about his parents and "whatever is on my mind". Enjoys his dog. Shared visits with friends that he visited this past week, a married  couple that check in with him typically weekly. Still carries some resentment re: former wife but not quite as strong as it was. States he visits sister locally each week. Does seem to appreciate coming to therapy and being able to talk through his feelings and concerns and have encouragement to make good decisions including getting out more as he is able, trying to refrain from a lot of negative assumptions, and looking for more of the positives even though things have changed in relationships over the years.   Interventions: Cognitive Behavioral Therapy, Solution-Oriented/Positive Psychology, and Ego-Supportive  Long term goal: Develop healthy cognitive patterns and beliefs about self and the world that lead to alleviation and help prevent relapse of depression. Short term goal: Identify and replace depressive thinking that leads to depressive feelings and actions.  He will replace the depressive thinking with more positive, hopeful thoughts that can lead to more positive and hopeful feelings. Strategy: Patient to more consistently monitor his thoughts and catch the depressive/negative thoughts, trying to change them to positive and more reality-based thought patterns. Look at his negative thoughts and weigh them against the evidence.   Diagnosis:   ICD-10-CM   1. Major depressive disorder, recurrent episode, moderate ([place])  F33.1      Plan: Patient today sharing and working on his depression and anxiety especially regarding time spent with family and his spending too much time alone, which we discussed in detail today and  patient has agreed to step up his contacts with his sister that lives locally, at least 2 family friends that frequently check in on him or invite him over, his church, and phone contact with family when they cannot be in person.  Ruminating a good bit about past issues but was able to be refocused multiple times on the present and some of the things and people in his life that  are really good for him.  To continue with goal-directed behaviors working to move in a forward direction and enjoy family and other relationships as much as he is able.  Encouraged patient in practicing positive behaviors including: Wearing his hearing aids more often as they definitely make his interactions better with others, staying in contact with people who care about him and are supportive, initiate interactions with adult children and grandchildren and his sister, using his cane when needed for stability and per doctor recommendations, encouraging self talk similar to examples discussed in session, getting outside daily as he is able with Dr. Christie Beckers, staying focused in the present and what he can change versus cannot, focus more on enjoying situations in the present rather than going back and focusing on negative issues from the past, stay on his prescribed medication, take advantage of opportunities he has to be with other people including church/community/family, and realize the strength he shows working with goal-directed behaviors to move in a direction that supports his improved emotional health and wellbeing.  Goal review and progress/challenges noted with patient.  Next appt within 5 to 6 weeks.  This record has been created using [employer].  Chart creation errors have been sought, but may not always have been located and corrected.  Such creation errors do not reflect on the standard of medical care provided.   Shanon Ace, LCSW

## 2022-11-04 ENCOUNTER — Encounter: Payer: Self-pay | Admitting: Nurse Practitioner

## 2022-11-04 ENCOUNTER — Emergency Department (HOSPITAL_COMMUNITY): Payer: Medicare Other

## 2022-11-04 ENCOUNTER — Inpatient Hospital Stay (HOSPITAL_COMMUNITY): Payer: Medicare Other

## 2022-11-04 ENCOUNTER — Inpatient Hospital Stay (HOSPITAL_COMMUNITY)
Admission: EM | Admit: 2022-11-04 | Discharge: 2022-11-08 | DRG: 309 | Disposition: A | Discharge status: Home Health | Payer: Medicare Other | Attending: Family Medicine | Admitting: Family Medicine

## 2022-11-04 ENCOUNTER — Other Ambulatory Visit: Payer: Self-pay

## 2022-11-04 ENCOUNTER — Encounter (HOSPITAL_COMMUNITY): Payer: Self-pay

## 2022-11-04 ENCOUNTER — Ambulatory Visit: Payer: Medicare Other | Admitting: Nurse Practitioner

## 2022-11-04 VITALS — BP 132/82 | HR 85 | Temp 97.7°F | Ht 73.0 in | Wt 206.8 lb

## 2022-11-04 DIAGNOSIS — S22080S Wedge compression fracture of T11-T12 vertebra, sequela: Secondary | ICD-10-CM

## 2022-11-04 DIAGNOSIS — N1831 Chronic kidney disease, stage 3a: Secondary | ICD-10-CM | POA: Diagnosis present

## 2022-11-04 DIAGNOSIS — N1832 Chronic kidney disease, stage 3b: Secondary | ICD-10-CM | POA: Diagnosis present

## 2022-11-04 DIAGNOSIS — Z8261 Family history of arthritis: Secondary | ICD-10-CM

## 2022-11-04 DIAGNOSIS — I480 Paroxysmal atrial fibrillation: Secondary | ICD-10-CM | POA: Diagnosis not present

## 2022-11-04 DIAGNOSIS — E785 Hyperlipidemia, unspecified: Secondary | ICD-10-CM | POA: Diagnosis present

## 2022-11-04 DIAGNOSIS — F331 Major depressive disorder, recurrent, moderate: Secondary | ICD-10-CM | POA: Diagnosis not present

## 2022-11-04 DIAGNOSIS — Q2112 Patent foramen ovale: Secondary | ICD-10-CM | POA: Diagnosis not present

## 2022-11-04 DIAGNOSIS — Z85828 Personal history of other malignant neoplasm of skin: Secondary | ICD-10-CM | POA: Diagnosis not present

## 2022-11-04 DIAGNOSIS — K580 Irritable bowel syndrome with diarrhea: Secondary | ICD-10-CM | POA: Diagnosis present

## 2022-11-04 DIAGNOSIS — Z0001 Encounter for general adult medical examination with abnormal findings: Secondary | ICD-10-CM | POA: Diagnosis not present

## 2022-11-04 DIAGNOSIS — I083 Combined rheumatic disorders of mitral, aortic and tricuspid valves: Secondary | ICD-10-CM | POA: Diagnosis present

## 2022-11-04 DIAGNOSIS — N400 Enlarged prostate without lower urinary tract symptoms: Secondary | ICD-10-CM | POA: Diagnosis present

## 2022-11-04 DIAGNOSIS — I110 Hypertensive heart disease with heart failure: Secondary | ICD-10-CM | POA: Diagnosis not present

## 2022-11-04 DIAGNOSIS — I4891 Unspecified atrial fibrillation: Secondary | ICD-10-CM | POA: Diagnosis present

## 2022-11-04 DIAGNOSIS — F32A Depression, unspecified: Secondary | ICD-10-CM | POA: Diagnosis present

## 2022-11-04 DIAGNOSIS — Z8249 Family history of ischemic heart disease and other diseases of the circulatory system: Secondary | ICD-10-CM

## 2022-11-04 DIAGNOSIS — Z833 Family history of diabetes mellitus: Secondary | ICD-10-CM

## 2022-11-04 DIAGNOSIS — K219 Gastro-esophageal reflux disease without esophagitis: Secondary | ICD-10-CM | POA: Diagnosis present

## 2022-11-04 DIAGNOSIS — Z0000 Encounter for general adult medical examination without abnormal findings: Secondary | ICD-10-CM

## 2022-11-04 DIAGNOSIS — Z136 Encounter for screening for cardiovascular disorders: Secondary | ICD-10-CM | POA: Diagnosis not present

## 2022-11-04 DIAGNOSIS — Z87891 Personal history of nicotine dependence: Secondary | ICD-10-CM

## 2022-11-04 DIAGNOSIS — M797 Fibromyalgia: Secondary | ICD-10-CM | POA: Diagnosis present

## 2022-11-04 DIAGNOSIS — I5022 Chronic systolic (congestive) heart failure: Secondary | ICD-10-CM | POA: Diagnosis present

## 2022-11-04 DIAGNOSIS — Z886 Allergy status to analgesic agent status: Secondary | ICD-10-CM

## 2022-11-04 DIAGNOSIS — I959 Hypotension, unspecified: Secondary | ICD-10-CM | POA: Diagnosis present

## 2022-11-04 DIAGNOSIS — Z885 Allergy status to narcotic agent status: Secondary | ICD-10-CM

## 2022-11-04 DIAGNOSIS — G4730 Sleep apnea, unspecified: Secondary | ICD-10-CM | POA: Diagnosis not present

## 2022-11-04 DIAGNOSIS — I700 Atherosclerosis of aorta: Secondary | ICD-10-CM | POA: Diagnosis present

## 2022-11-04 DIAGNOSIS — I10 Essential (primary) hypertension: Secondary | ICD-10-CM | POA: Diagnosis not present

## 2022-11-04 DIAGNOSIS — K7460 Unspecified cirrhosis of liver: Secondary | ICD-10-CM | POA: Diagnosis present

## 2022-11-04 DIAGNOSIS — Z9841 Cataract extraction status, right eye: Secondary | ICD-10-CM | POA: Diagnosis not present

## 2022-11-04 DIAGNOSIS — R6889 Other general symptoms and signs: Secondary | ICD-10-CM | POA: Diagnosis not present

## 2022-11-04 DIAGNOSIS — K766 Portal hypertension: Secondary | ICD-10-CM | POA: Diagnosis present

## 2022-11-04 DIAGNOSIS — K589 Irritable bowel syndrome without diarrhea: Secondary | ICD-10-CM

## 2022-11-04 DIAGNOSIS — E782 Mixed hyperlipidemia: Secondary | ICD-10-CM

## 2022-11-04 DIAGNOSIS — R7309 Other abnormal glucose: Secondary | ICD-10-CM | POA: Diagnosis not present

## 2022-11-04 DIAGNOSIS — I130 Hypertensive heart and chronic kidney disease with heart failure and stage 1 through stage 4 chronic kidney disease, or unspecified chronic kidney disease: Secondary | ICD-10-CM | POA: Diagnosis present

## 2022-11-04 DIAGNOSIS — Z7982 Long term (current) use of aspirin: Secondary | ICD-10-CM

## 2022-11-04 DIAGNOSIS — R9431 Abnormal electrocardiogram [ECG] [EKG]: Secondary | ICD-10-CM | POA: Diagnosis not present

## 2022-11-04 DIAGNOSIS — Z79899 Other long term (current) drug therapy: Secondary | ICD-10-CM

## 2022-11-04 DIAGNOSIS — Z880 Allergy status to penicillin: Secondary | ICD-10-CM

## 2022-11-04 DIAGNOSIS — G4733 Obstructive sleep apnea (adult) (pediatric): Secondary | ICD-10-CM | POA: Diagnosis present

## 2022-11-04 DIAGNOSIS — R079 Chest pain, unspecified: Secondary | ICD-10-CM | POA: Diagnosis present

## 2022-11-04 DIAGNOSIS — I4510 Unspecified right bundle-branch block: Secondary | ICD-10-CM | POA: Diagnosis present

## 2022-11-04 DIAGNOSIS — D696 Thrombocytopenia, unspecified: Secondary | ICD-10-CM | POA: Diagnosis present

## 2022-11-04 DIAGNOSIS — Z1211 Encounter for screening for malignant neoplasm of colon: Secondary | ICD-10-CM

## 2022-11-04 DIAGNOSIS — E559 Vitamin D deficiency, unspecified: Secondary | ICD-10-CM

## 2022-11-04 DIAGNOSIS — N270 Small kidney, unilateral: Secondary | ICD-10-CM

## 2022-11-04 DIAGNOSIS — Z801 Family history of malignant neoplasm of trachea, bronchus and lung: Secondary | ICD-10-CM

## 2022-11-04 DIAGNOSIS — Z803 Family history of malignant neoplasm of breast: Secondary | ICD-10-CM

## 2022-11-04 DIAGNOSIS — Z9842 Cataract extraction status, left eye: Secondary | ICD-10-CM | POA: Diagnosis not present

## 2022-11-04 DIAGNOSIS — Z888 Allergy status to other drugs, medicaments and biological substances status: Secondary | ICD-10-CM

## 2022-11-04 DIAGNOSIS — N179 Acute kidney failure, unspecified: Secondary | ICD-10-CM | POA: Diagnosis not present

## 2022-11-04 DIAGNOSIS — I509 Heart failure, unspecified: Secondary | ICD-10-CM | POA: Diagnosis not present

## 2022-11-04 DIAGNOSIS — N289 Disorder of kidney and ureter, unspecified: Secondary | ICD-10-CM | POA: Diagnosis not present

## 2022-11-04 DIAGNOSIS — N183 Chronic kidney disease, stage 3 unspecified: Secondary | ICD-10-CM | POA: Diagnosis present

## 2022-11-04 DIAGNOSIS — N401 Enlarged prostate with lower urinary tract symptoms: Secondary | ICD-10-CM

## 2022-11-04 DIAGNOSIS — I499 Cardiac arrhythmia, unspecified: Secondary | ICD-10-CM

## 2022-11-04 DIAGNOSIS — Z9181 History of falling: Secondary | ICD-10-CM

## 2022-11-04 LAB — CBC
HCT: 40.7 % (ref 39.0–52.0)
Hemoglobin: 13.8 g/dL (ref 13.0–17.0)
MCH: 31.7 pg (ref 26.0–34.0)
MCHC: 33.9 g/dL (ref 30.0–36.0)
MCV: 93.3 fL (ref 80.0–100.0)
Platelets: 116 10*3/uL — ABNORMAL LOW (ref 150–400)
RBC: 4.36 MIL/uL (ref 4.22–5.81)
RDW: 14.6 % (ref 11.5–15.5)
WBC: 5.5 10*3/uL (ref 4.0–10.5)
nRBC: 0 % (ref 0.0–0.2)

## 2022-11-04 LAB — BASIC METABOLIC PANEL
Anion gap: 8 (ref 5–15)
BUN: 7 mg/dL — ABNORMAL LOW (ref 8–23)
CO2: 22 mmol/L (ref 22–32)
Calcium: 8.9 mg/dL (ref 8.9–10.3)
Chloride: 109 mmol/L (ref 98–111)
Creatinine, Ser: 1.5 mg/dL — ABNORMAL HIGH (ref 0.61–1.24)
GFR, Estimated: 49 mL/min — ABNORMAL LOW (ref >60)
Glucose, Bld: 79 mg/dL (ref 70–99)
Potassium: 4 mmol/L (ref 3.5–5.1)
Sodium: 139 mmol/L (ref 135–145)

## 2022-11-04 LAB — PROTIME-INR
INR: 1.4 — ABNORMAL HIGH (ref 0.8–1.2)
Prothrombin Time: 17.4 seconds — ABNORMAL HIGH (ref 11.4–15.2)

## 2022-11-04 LAB — TSH: TSH: 2.068 u[IU]/mL (ref 0.350–4.500)

## 2022-11-04 LAB — MAGNESIUM: Magnesium: 2 mg/dL (ref 1.7–2.4)

## 2022-11-04 LAB — TROPONIN I (HIGH SENSITIVITY)
Troponin I (High Sensitivity): 8 ng/L (ref <18)
Troponin I (High Sensitivity): 10 ng/L (ref <18)

## 2022-11-04 MED ORDER — SODIUM CHLORIDE 0.9 % IV BOLUS
1000.0000 mL | Freq: Once | INTRAVENOUS | Status: AC
  Administered 2022-11-04: 1000 mL via INTRAVENOUS

## 2022-11-04 MED ORDER — LACTULOSE 10 GM/15ML PO SOLN: 10.0000 g | Freq: Two times a day (BID) | ORAL | Status: DC | PRN

## 2022-11-04 MED ORDER — LORAZEPAM 0.5 MG PO TABS
0.5000 mg | ORAL_TABLET | Freq: Every day | ORAL | Status: DC
  Administered 2022-11-04 – 2022-11-07 (×4): 0.5 mg via ORAL
  Filled 2022-11-04 (×4): qty 1

## 2022-11-04 MED ORDER — ASPIRIN 81 MG PO TBEC
81.0000 mg | DELAYED_RELEASE_TABLET | Freq: Every day | ORAL | Status: DC
  Administered 2022-11-05: 81 mg via ORAL
  Filled 2022-11-04: qty 1

## 2022-11-04 MED ORDER — HEPARIN (PORCINE) 25000 UT/250ML-% IV SOLN
1350.0000 [IU]/h | INTRAVENOUS | Status: DC
  Administered 2022-11-04 – 2022-11-06 (×3): 1350 [IU]/h via INTRAVENOUS
  Filled 2022-11-04 (×3): qty 250

## 2022-11-04 MED ORDER — DILTIAZEM LOAD VIA INFUSION
10.0000 mg | Freq: Once | INTRAVENOUS | Status: AC
  Administered 2022-11-04: 10 mg via INTRAVENOUS
  Filled 2022-11-04: qty 10

## 2022-11-04 MED ORDER — METHOCARBAMOL 500 MG PO TABS: 500.0000 mg | ORAL_TABLET | Freq: Four times a day (QID) | ORAL | Status: DC | PRN

## 2022-11-04 MED ORDER — DILTIAZEM HCL-DEXTROSE 125-5 MG/125ML-% IV SOLN (PREMIX)
5.0000 mg/h | INTRAVENOUS | Status: DC
  Administered 2022-11-04: 5 mg/h via INTRAVENOUS
  Filled 2022-11-04: qty 125

## 2022-11-04 MED ORDER — DIGOXIN 0.25 MG/ML IJ SOLN
0.1250 mg | Freq: Four times a day (QID) | INTRAMUSCULAR | Status: AC
  Administered 2022-11-04 – 2022-11-05 (×4): 0.125 mg via INTRAVENOUS
  Filled 2022-11-04 (×4): qty 2

## 2022-11-04 MED ORDER — SERTRALINE HCL 100 MG PO TABS
100.0000 mg | ORAL_TABLET | Freq: Every day | ORAL | Status: DC
  Administered 2022-11-04 – 2022-11-08 (×5): 100 mg via ORAL
  Filled 2022-11-04 (×5): qty 1

## 2022-11-04 MED ORDER — ROSUVASTATIN CALCIUM 20 MG PO TABS
40.0000 mg | ORAL_TABLET | Freq: Every day | ORAL | Status: DC
  Administered 2022-11-04 – 2022-11-08 (×5): 40 mg via ORAL
  Filled 2022-11-04 (×5): qty 2

## 2022-11-04 MED ORDER — ACETAMINOPHEN 325 MG PO TABS: 650.0000 mg | ORAL_TABLET | Freq: Three times a day (TID) | ORAL | Status: DC | PRN

## 2022-11-04 MED ORDER — METOPROLOL TARTRATE 5 MG/5ML IV SOLN
2.5000 mg | Freq: Four times a day (QID) | INTRAVENOUS | Status: DC | PRN
  Administered 2022-11-05: 2.5 mg via INTRAVENOUS
  Filled 2022-11-04: qty 5

## 2022-11-04 MED ORDER — SODIUM CHLORIDE 0.9 % IV BOLUS
500.0000 mL | Freq: Once | INTRAVENOUS | Status: AC
  Administered 2022-11-04: 500 mL via INTRAVENOUS

## 2022-11-04 MED ORDER — HEPARIN BOLUS VIA INFUSION
5000.0000 [IU] | Freq: Once | INTRAVENOUS | Status: AC
  Administered 2022-11-04: 5000 [IU] via INTRAVENOUS
  Filled 2022-11-04: qty 5000

## 2022-11-04 MED ORDER — METOPROLOL TARTRATE 25 MG PO TABS
25.0000 mg | ORAL_TABLET | Freq: Two times a day (BID) | ORAL | Status: DC
  Administered 2022-11-04: 25 mg via ORAL
  Filled 2022-11-04: qty 1

## 2022-11-04 MED ORDER — PANTOPRAZOLE SODIUM 40 MG PO TBEC
40.0000 mg | DELAYED_RELEASE_TABLET | Freq: Every day | ORAL | Status: DC
  Administered 2022-11-04 – 2022-11-08 (×5): 40 mg via ORAL
  Filled 2022-11-04 (×5): qty 1

## 2022-11-04 NOTE — ED Triage Notes (Signed)
From doctor's office. Chest pain and shortness of breath. A fib with RVR. Given total of '35mg'$  of cardizem per EMS over 3 doses, last given '@1313'$  '15mg'$ . Also given 1L of NS. Chest pressure and shortness of breath has resolved upon arrival. Took '81mg'$  aspirin this morning, cannot have more due to hx of ulcers. Denies history of a fib.

## 2022-11-04 NOTE — Progress Notes (Signed)
Patient BP was running low earlier and cardizem drip D/Ced.  Last hour BP has continuously to be low, ordered 500 ml bolus initially, will order another 1000 ml bolus.  HR back and forth in 90-110s, will start patient on digoxin loading.

## 2022-11-04 NOTE — H&P (Signed)
History and Physical    Cody Hines Cody Hines. IRW:[id number] DOB: [birth year] DOA: 11/04/2022  PCP: [name], MD (Confirm with patient/family/NH records and if not entered, this has to be entered at [facility] point of entry) Patient coming from: Home  I have personally briefly reviewed patient's old medical records in [place]  Chief Complaint: Chest pain, heart racing  HPI: Cody Hines Cody Hines. is a 77 y.o. male with medical history significant of liver cirrhosis, HTN, HLD, CKD stage II, OSA, sent from PCPs office for evaluation of new side effect.  Patient complains about been feeling tired, malaise and episode of chest pain for about 1 week.  Chest pain has been random and not associated with activity, pressure-like 2-3/10 associated with shortness of breath, usually resolved by self in couple minutes.  No chest pain at night.  Today he went to see his PCP on regular 26-monthcheckup was found in rapid A-fib and sent to ED.  He also reported has a remote history of GI bleed when he was in his 362s for which he underwent EGD but no history of transfusion.  Last year he was diagnosed with cirrhosis with no clear etiology and underwent EGD again which showed portal hypertensive gastritis but no clear evidence of varices.  ED Course: Heart rate in the 140-170s and received IV bolus of Cardizem and started on Cardizem drip.  Chest x-ray lungs are clear.  Troponin negative x 2.  Review of Systems: As per HPI otherwise 14 point review of systems negative.    Past Medical History:  Diagnosis Date   BPH (benign prostatic hyperplasia)    Fibromyalgia    GERD (gastroesophageal reflux disease)    Hyperlipidemia    Hypertension    Hypogonadism male    IBS (irritable bowel syndrome)    OSA (obstructive sleep apnea)     Past Surgical History:  Procedure Laterality Date    nasal smr np3  1985   CATARACT EXTRACTION, BILATERAL Bilateral 2021   Dr. GKaty Fitch L 5/27, R 3/25    COLONOSCOPY     KNEE ARTHROSCOPY Left 1999   SKIN CANCER EXCISION  2020   nose   SPINE SURGERY  2007   L5 S 1 Disk   SPINE SURGERY  09/2019   Compression fracture stabilization by Dr. JEber Jones 1983     reports that he has never smoked. He has never used smokeless tobacco. He reports that he does not currently use alcohol. He reports that he does not use drugs.  Allergies  Allergen Reactions   Asa [Aspirin] Diarrhea, Nausea Only and Other (See Comments)    History of ulcers, also   Atorvastatin Other (See Comments)    Muscle aches   Celebrex [Celecoxib] Other (See Comments)    Headaches    Codeine Nausea And Vomiting   Savella [Milnacipran Hcl] Other (See Comments)    Reaction not recalled   Viagra [Sildenafil Citrate] Other (See Comments)    Headaches   Penicillins Rash    Family History  Problem Relation Age of Onset   Cancer Mother        breast   Cancer Father        lung   Diabetes Father    Heart disease Sister    Arthritis Sister    Colon cancer Neg Hx    Colon polyps Neg Hx      Prior to Admission medications   Medication Sig Start Date End Date  Taking? Authorizing Provider  acetaminophen (TYLENOL) 650 MG CR tablet Take 650 mg by mouth every 8 (eight) hours as needed for pain (or back pain or migraines).     [provider]  aspirin EC 81 MG tablet Take 81 mg by mouth daily.    [provider]  enalapril (VASOTEC) 20 MG tablet TAKE 1 TABLET BY MOUTH  DAILY FOR BLOOD PRESSURE 03/21/22   Liane Comber, NP  lactulose (CHRONULAC) 10 GM/15ML solution Take 15 mL up to 3 times daily, goal is to have 2-3 bowel movements daily. 03/17/22   Vladimir Crofts, PA-C  LORazepam (ATIVAN) 1 MG tablet TAKE 1 TABLET BY MOUTH EACH NIGHT AT BEDTIME FOR SLEEP 05/12/22   Cottle, Billey Co., MD  methocarbamol (ROBAXIN) 500 MG tablet TAKE 1 TABLET BY MOUTH EVERY 6 HOURS AS NEEDED FOR MUSCLE SPASMS 05/13/22   Alycia Rossetti, NP  Multiple Vitamin  (MULTIVITAMIN) capsule Take 1 capsule by mouth daily.    [provider]  rosuvastatin (CRESTOR) 40 MG tablet Take  1 tablet  Daily  for Cholesterol 03/23/22   [name], MD  sertraline (ZOLOFT) 100 MG tablet Take 1 tablet (100 mg total) by mouth in the morning and at bedtime. 10/09/22   Purnell Shoemaker., MD    Physical Exam: Vitals:   11/04/22 1515 11/04/22 1516 11/04/22 1545 11/04/22 1554  BP: (!) 116/93   120/71  Pulse:  (!) 136 (!) 149 (!) 131  Resp:  13 16   Temp:      TempSrc:      SpO2:  98% 99%     Constitutional: NAD, calm, comfortable Vitals:   11/04/22 1515 11/04/22 1516 11/04/22 1545 11/04/22 1554  BP: (!) 116/93   120/71  Pulse:  (!) 136 (!) 149 (!) 131  Resp:  13 16   Temp:      TempSrc:      SpO2:  98% 99%    Eyes: PERRL, lids and conjunctivae normal ENMT: Mucous membranes are moist. Posterior pharynx clear of any exudate or lesions.Normal dentition.  Neck: normal, supple, no masses, no thyromegaly Respiratory: clear to auscultation bilaterally, no wheezing, no crackles. Normal respiratory effort. No accessory muscle use.  Cardiovascular: Irregular heart rate, no murmurs / rubs / gallops. No extremity edema. 2+ pedal pulses. No carotid bruits.  Abdomen: no tenderness, no masses palpated. No hepatosplenomegaly. Bowel sounds positive.  Musculoskeletal: no clubbing / cyanosis. No joint deformity upper and lower extremities. Good ROM, no contractures. Normal muscle tone.  Skin: no rashes, lesions, ulcers. No induration Neurologic: CN 2-12 grossly intact. Sensation intact, DTR normal. Strength 5/5 in all 4.  Psychiatric: Normal judgment and insight. Alert and oriented x 3. Normal mood.    Labs on Admission: I have personally reviewed following labs and imaging studies  CBC: Recent Labs  Lab 11/04/22 1348  WBC 5.5  HGB 13.8  HCT 40.7  MCV 93.3  PLT 021*   Basic Metabolic Panel: Recent Labs  Lab 11/04/22 1348  NA 139  K 4.0  CL 109  CO2  22  GLUCOSE 79  BUN 7*  CREATININE 1.50*  CALCIUM 8.9   GFR: Estimated Creatinine Clearance: 48.8 mL/min (A) (by C-G formula based on SCr of 1.5 mg/dL (H)). Liver Function Tests: No results for input(s): "AST", "ALT", "ALKPHOS", "BILITOT", "PROT", "ALBUMIN" in the last 168 hours. No results for input(s): "LIPASE", "AMYLASE" in the last 168 hours. No results for input(s): "AMMONIA" in the last 168  hours. Coagulation Profile: No results for input(s): "INR", "PROTIME" in the last 168 hours. Cardiac Enzymes: No results for input(s): "CKTOTAL", "CKMB", "CKMBINDEX", "TROPONINI" in the last 168 hours. BNP (last 3 results) No results for input(s): "PROBNP" in the last 8760 hours. HbA1C: No results for input(s): "HGBA1C" in the last 72 hours. CBG: No results for input(s): "GLUCAP" in the last 168 hours. Lipid Profile: No results for input(s): "CHOL", "HDL", "LDLCALC", "TRIG", "CHOLHDL", "LDLDIRECT" in the last 72 hours. Thyroid Function Tests: No results for input(s): "TSH", "T4TOTAL", "FREET4", "T3FREE", "THYROIDAB" in the last 72 hours. Anemia Panel: No results for input(s): "VITAMINB12", "FOLATE", "FERRITIN", "TIBC", "IRON", "RETICCTPCT" in the last 72 hours. Urine analysis:    Component Value Date/Time   COLORURINE YELLOW 01/09/2022 1108   APPEARANCEUR CLEAR 01/09/2022 1108   LABSPEC 1.013 01/09/2022 1108   PHURINE < OR = 5.0 01/09/2022 1108   GLUCOSEU NEGATIVE 01/09/2022 1108   HGBUR NEGATIVE 01/09/2022 1108   [place] 06/26/2021 [place] 01/09/2022 1108   PROTEINUR NEGATIVE 01/09/2022 1108   UROBILINOGEN 0.2 05/05/2014 1114   NITRITE NEGATIVE 01/09/2022 1108   LEUKOCYTESUR NEGATIVE 01/09/2022 1108    Radiological Exams on Admission: DG Chest Portable 1 View  Result Date: 11/04/2022 CLINICAL DATA:  Shortness of breath EXAM: PORTABLE CHEST 1 VIEW COMPARISON:  None Available. FINDINGS: No pleural effusion. No pneumothorax. No focal airspace  opacity. Unchanged cardiac and mediastinal contours. Visualized upper abdomen is unremarkable. No displaced rib fractures. IMPRESSION: No focal airspace opacity. Electronically Signed   By: Marin Roberts M.D.   On: 11/04/2022 14:59    EKG: Independently reviewed.  Rapid A-fib with no acute ST changes.  Assessment/Plan Principal Problem:   A-fib (HCC) Active Problems:   Essential hypertension   CKD (chronic kidney disease) stage 3, GFR 30-59 ml/min (HCC)   Cirrhosis ([place])  (please populate well all problems here in Problem List. (For example, if patient is on BP meds at home and you resume or decide to hold them, it is a problem that needs to be her. Same for CAD, COPD, HLD and so on)  New onset A-fib with RVR -Estimated onset about 7 days ago -Continue Cardizem drip, start p.o. metoprolol to tartrate Cardizem drip. -Cardiology consulted -CHADS2=2. However, patient has Hx of cirrhosis with portal hypertensive gastritis and increasing INR, thus in increasing bleeding risk. Case discussed with [facility] Dr. Hilarie Fredrickson, who agreed with ASA, and recommend adding PPI for prophylaxis.  -For now, it appears that patient on 15 mcg/HR of Cardizem but heart rate not controlled, expect that patient may need cardioversion, start patient on heparin drip until we know for sure he does not need cardioversion. Patient and his daughter agreed with the plan. -Check INR, TSH, K=4.0, Mg level pending -Check echocardiogram  Hx liver cirrhosis with known portal hypertension and history of hepatic encephalopathy -Mentation at baseline, continue lactulose twice daily as needed to titrate bowel movement -No overt symptoms signs of bleeding, check INR -Outpatient follow-up with GI.  HTN -Hold off home BP meds as patient is on Cardizem drip now.  OSA -CPAP at bedtime  CKD stage II -Euvolemic, Creatinine level stable.  DVT prophylaxis: Heparin drip Code Status: Full code Family Communication: Daughter at  bedside Disposition Plan: Patient is sick with new onset A-fib requiring IV medication for rate control and may need cardioversion, expect more than 2 midnight hospital stay. Consults called: Already and curbside consultation to [facility], reconsult if needed. Admission status: PCU  Lequita Halt MD [facility] Pager [phone]  11/04/2022, 4:37 PM

## 2022-11-04 NOTE — Progress Notes (Signed)
[facility] night cross cover note:   I was contacted by RN requesting clarification on whether or not the patient's metoprolol, scheduled for 2200 this evening, should be given, noting that the patient had a significant drop in blood pressure when he last received metoprolol.  Current systolic blood pressures in the low 100s with current heart rates also in the low 100s bpm.  Based upon this information, I conveyed that it is okay to hold the next scheduled dose of metoprolol for now, while closely monitoring ensuing heart rate and BP.     Babs Bertin, DO Hospitalist

## 2022-11-04 NOTE — ED Notes (Signed)
Got patient undressed into a gown on the monitor got patient some warm blankets did EKG shown to Dr Jeanell Sparrow patient is resting with call bell in reach

## 2022-11-04 NOTE — ED Provider Notes (Signed)
[name]   CSN: [id number] Arrival date & time: 11/04/22  1331     History  Chief Complaint  Patient presents with   Chest Pain    Cody Hines. is a 77 y.o. male.  77 year old male with a past medical history of high blood pressure presents to the ED from the doctor's office after he was getting a Medicare checkup for new onset of A-fib with RVR.  Patient reports he was there for a routine checkup with Medicare, reports he noted his pulse to be elevated, they performed an EKG which showed A-fib with RVR with a heart rate in the 100s.  He reports no prior history of A-fib.  He has been feeling somewhat fatigued, and a left-sided heart squeezing over the last month, he does report he has been getting over a viral infection.  He does report feeling more fatigue over the last month.  He does state his sister was diagnosed with A-fib last week.  He is currently not anticoagulated, not previously followed by cardiology.  Patient did receive 35 mg of Cardizem over 3 doses the last 1 being given at 1313 of '15mg'$ . He continues to have a HR in the 120's. No cough, no fever, no leg swelling.    From doctor's office. Chest pain and shortness of breath. A fib with RVR. Given total of '35mg'$  of cardizem per EMS over 3 doses, last given '@1313'$  '15mg'$ . Also given 1L of NS. Chest pressure and shortness of breath has resolved upon arrival. Took '81mg'$  aspirin this morning, cannot have more due to hx of ulcers. Denies history of a fib.  The history is provided by the patient and medical records.  Chest Pain Pain location:  L chest Pain quality: tightness   Pain radiates to:  Does not radiate Pain severity:  Moderate Onset quality:  Gradual Duration:  1 month Timing:  Intermittent Progression:  Unchanged Chronicity:  New Context: movement   Relieved by:  Nothing Worsened by:  Nothing Associated symptoms: fatigue and shortness of  breath   Associated symptoms: no abdominal pain, no back pain, no fever, no headache, no nausea and no vomiting        Home Medications Prior to Admission medications   Medication Sig Start Date End Date Taking? Authorizing Provider  acetaminophen (TYLENOL) 650 MG CR tablet Take 650 mg by mouth every 8 (eight) hours as needed for pain (or back pain or migraines).     [provider]  aspirin EC 81 MG tablet Take 81 mg by mouth daily.    [provider]  enalapril (VASOTEC) 20 MG tablet TAKE 1 TABLET BY MOUTH  DAILY FOR BLOOD PRESSURE 03/21/22   Liane Comber, NP  lactulose (CHRONULAC) 10 GM/15ML solution Take 15 mL up to 3 times daily, goal is to have 2-3 bowel movements daily. 03/17/22   Vladimir Crofts, PA-C  LORazepam (ATIVAN) 1 MG tablet TAKE 1 TABLET BY MOUTH EACH NIGHT AT BEDTIME FOR SLEEP 05/12/22   Cottle, Billey Co., MD  methocarbamol (ROBAXIN) 500 MG tablet TAKE 1 TABLET BY MOUTH EVERY 6 HOURS AS NEEDED FOR MUSCLE SPASMS 05/13/22   Alycia Rossetti, NP  Multiple Vitamin (MULTIVITAMIN) capsule Take 1 capsule by mouth daily.    [provider]  rosuvastatin (CRESTOR) 40 MG tablet Take  1 tablet  Daily  for Cholesterol 03/23/22   [name], MD  sertraline (ZOLOFT) 100 MG tablet Take  1 tablet (100 mg total) by mouth in the morning and at bedtime. 10/09/22   Cottle, Billey Co., MD      Allergies    Asa [aspirin], Atorvastatin, Celebrex [celecoxib], Codeine, Savella [milnacipran hcl], Viagra [sildenafil citrate], and Penicillins    Review of Systems   Review of Systems  Constitutional:  Positive for fatigue. Negative for chills and fever.  HENT:  Negative for sore throat.   Respiratory:  Positive for shortness of breath.   Cardiovascular:  Positive for chest pain.  Gastrointestinal:  Negative for abdominal pain, nausea and vomiting.  Genitourinary:  Negative for flank pain.  Musculoskeletal:  Negative for back pain.  Neurological:  Negative for  light-headedness and headaches.  All other systems reviewed and are negative.   Physical Exam Updated Vital Signs BP (!) 116/93   Pulse (!) 136   Temp 98.3 F (36.8 C) (Oral)   Resp 13   SpO2 98%  Physical Exam Vitals and nursing note reviewed.  Constitutional:      Appearance: He is well-developed. He is not ill-appearing.  HENT:     Head: Normocephalic.  Cardiovascular:     Rate and Rhythm: Rhythm irregular.     Heart sounds: No murmur heard.    Comments: Irregularly irregular.  Pulmonary:     Effort: Pulmonary effort is normal. No tachypnea.     Breath sounds: Normal breath sounds. No decreased breath sounds or wheezing.  Neurological:     Mental Status: He is alert.     ED Results / Procedures / Treatments   Labs (all labs ordered are listed, but only abnormal results are displayed) Labs Reviewed  BASIC METABOLIC PANEL - Abnormal; Notable for the following components:      Result Value   BUN 7 (*)    Creatinine, Ser 1.50 (*)    GFR, Estimated 49 (*)    All other components within normal limits  CBC - Abnormal; Notable for the following components:   Platelets 116 (*)    All other components within normal limits  TROPONIN I (HIGH SENSITIVITY)  TROPONIN I (HIGH SENSITIVITY)    EKG None  Radiology DG Chest Portable 1 View  Result Date: 11/04/2022 CLINICAL DATA:  Shortness of breath EXAM: PORTABLE CHEST 1 VIEW COMPARISON:  None Available. FINDINGS: No pleural effusion. No pneumothorax. No focal airspace opacity. Unchanged cardiac and mediastinal contours. Visualized upper abdomen is unremarkable. No displaced rib fractures. IMPRESSION: No focal airspace opacity. Electronically Signed   By: Marin Roberts M.D.   On: 11/04/2022 14:59    Procedures .Critical Care  Performed by: Janeece Fitting, PA-C Authorized by: Janeece Fitting, PA-C   Critical care provider statement:    Critical care time (minutes):  45   Critical care start time:  11/04/2022 2:30 PM   Critical  care end time:  11/04/2022 3:15 PM   Critical care was necessary to treat or prevent imminent or life-threatening deterioration of the following conditions:  Circulatory failure and cardiac failure   Critical care was time spent personally by me on the following activities:  Development of treatment plan with patient or surrogate, discussions with consultants, evaluation of patient's response to treatment, examination of patient, ordering and review of laboratory studies, ordering and review of radiographic studies, ordering and performing treatments and interventions, pulse oximetry, re-evaluation of patient's condition and review of old charts     Medications Ordered in ED Medications  diltiazem (CARDIZEM) 1 mg/mL load via infusion 10 mg (10 mg Intravenous  Bolus from Bag 11/04/22 1441)    And  diltiazem (CARDIZEM) 125 mg in dextrose 5% 125 mL (1 mg/mL) infusion (10 mg/hr Intravenous Rate/Dose Change 11/04/22 1516)  metoprolol tartrate (LOPRESSOR) tablet 25 mg (has no administration in time range)    ED Course/ Medical Decision Making/ A&P                             Medical Decision Making Amount and/or Complexity of Data Reviewed Labs: ordered. Radiology: ordered.  Risk Prescription drug management.     This patient presents to the ED for concern of Afib, this involves a number of treatment options, and is a complaint that carries with it a high risk of complications and morbidity.  The differential diagnosis includes PAF versus new onset afib versus underlying metabolic condition causing Afib.   Co morbidities: Discussed in HPI   Brief History:  Patient with underlying history of high blood pressure here with new onset of A-fib with RVR found at [facility] checkup where he was going to be seen for a routine checkup.  Has felt fatigued, thought it was likely a postviral fatigue from an upper respiratory infection earlier last month.  Also endorsing shortness of breath.  Given  Cardizem push via EMS with a total of 35 mg with some improvement in HR.   EMR reviewed including pt PMHx, past surgical history and past visits to ER.   See HPI for more details   Lab Tests:  I ordered and independently interpreted labs.  The pertinent results include:    I personally reviewed all laboratory work and imaging. Metabolic panel without any acute abnormality specifically kidney function within normal limits and no significant electrolyte abnormalities. CBC without leukocytosis or significant anemia.   Imaging Studies:  NAD. I personally reviewed all imaging studies and no acute abnormality found. I agree with radiology interpretation.    Cardiac Monitoring:  The patient was maintained on a cardiac monitor.  I personally viewed and interpreted the cardiac monitored which showed an underlying rhythm of: Irregularly irregular HR 137 EKG non-ischemic   Medicines ordered:  I ordered medication including cardizem load and infusion  for rate control  Reevaluation of the patient after these medicines showed that the patient stayed the same I have reviewed the patients home medicines and have made adjustments as needed   Critical Interventions:  Patient given Cardizem load and infusion with normal blood pressure at this time. Will call cardiology for further recommendations.   Consults:  I requested consultation with Dr. Terrence Dupont of cardiology,  and discussed lab and imaging findings as well as pertinent plan - they recommend: admission via hospitalist service and they will follow in consultation.   Reevaluation:  After the interventions noted above I re-evaluated patient and found that they have :stayed the same   Social Determinants of Health:  The patient's social determinants of health were a factor in the care of this patient  Problem List / ED Course:  Patient presents to the ED with a chief complaint of sudden onset of A-fib and RVR while at [facility]  office today.  He reports he has been feeling fatigued for the past month, is unsure whether he has been in A-fib this entire time, he is not anticoagulated.  He does not have a prior cardiac history aside from high blood pressure.  Labs reviewed and interpreted by me without any acute findings.  Electrolytes are within normal limits, creatinine  level slightly elevated.  However, this is without his prior baseline.  Chest x-ray did not show any acute finding.  No signs of infection or superimposed component to be causing this A-fib at this time.  I discussed the case with Dr. Terrence Dupont of cardiology who recommended admission.  Patient was also started on a Cardizem infusion and loading. CHA2DS2-VASc Score = 1  (HTN) All workup has been reassuring at this time, he remains normotensive.  I we will place a call for hospitalist admission at this time.  Dispostion:  Patient admitted to hospitalist team for further management of Afib with RVR.Patient is hemodynamically stable. Spoke to Dr. Roosevelt Locks who will admit patient for further management.    Portions of this note were generated with [employer]. Dictation errors may occur despite best attempts at proofreading.  Final Clinical Impression(s) / ED Diagnoses Final diagnoses:  Atrial fibrillation, unspecified type [facility])    Rx / DC Orders ED Discharge Orders     None         Janeece Fitting, PA-C 11/04/22 1532    Pattricia Boss, MD 11/05/22 1313

## 2022-11-04 NOTE — ED Notes (Signed)
Md notified of pt's blood pressure

## 2022-11-04 NOTE — Progress Notes (Signed)
ANTICOAGULATION CONSULT NOTE - Initial Consult  Pharmacy Consult for heparin Indication: atrial fibrillation  Allergies  Allergen Reactions   Asa [Aspirin] Diarrhea, Nausea Only and Other (See Comments)    History of ulcers, also   Atorvastatin Other (See Comments)    Muscle aches   Celebrex [Celecoxib] Other (See Comments)    Headaches    Codeine Nausea And Vomiting   Savella [Milnacipran Hcl] Other (See Comments)    Reaction not recalled   Viagra [Sildenafil Citrate] Other (See Comments)    Headaches   Penicillins Rash     Vital Signs: Temp: 98.3 F (36.8 C) (01/30 1337) Temp Source: Oral (01/30 1337) BP: 120/71 (01/30 1554) Pulse Rate: 131 (01/30 1554)  Labs: Recent Labs    11/04/22 1348  HGB 13.8  HCT 40.7  PLT 116*  CREATININE 1.50*  TROPONINIHS 8    Estimated Creatinine Clearance: 48.8 mL/min (A) (by C-G formula based on SCr of 1.5 mg/dL (H)).   Medical History: Past Medical History:  Diagnosis Date   BPH (benign prostatic hyperplasia)    Fibromyalgia    GERD (gastroesophageal reflux disease)    Hyperlipidemia    Hypertension    Hypogonadism male    IBS (irritable bowel syndrome)    OSA (obstructive sleep apnea)      Assessment: Patient presenting to the ED from doctors office with new afib with RVR. No AC PTA. Pharmacy consulted to dose heparin.   Patient started on cardizem infusion.  Scr 1.5 with history of CKD. Hgb 13.8 and plt 116. Platelets at baseline with a history of thrombocytopenia.   Goal of Therapy:  Heparin level 0.3-0.7 units/ml Monitor platelets by anticoagulation protocol: Yes   Plan:  Give heparin 5000 units IV x1  Start heparin infusion at 1350 units/hr  Will f/u heparin level in 8 hours Monitor daily heparin level and CBC Continue to monitor H&H     Gena Fray, PharmD PGY1 Pharmacy Resident   11/04/2022 4:41 PM

## 2022-11-04 NOTE — Patient Instructions (Signed)
Atrial Fibrillation  Atrial fibrillation is a type of heartbeat that is irregular or fast. If you have this condition, your heart beats without any order. This makes it hard for your heart to pump blood in a normal way. Atrial fibrillation may come and go, or it may become a long-lasting problem. If this condition is not treated, it can put you at higher risk for stroke, heart failure, and other heart problems. What are the causes? This condition may be caused by diseases that damage the heart. They include: High blood pressure. Heart failure. Heart valve disease. Heart surgery. Other causes include: Diabetes. Thyroid disease. Being overweight. Kidney disease. Sometimes the cause is not known. What increases the risk? You are more likely to develop this condition if: You are older. You smoke. You exercise often and very hard. You have a family history of this condition. You are a man. You use drugs. You drink a lot of alcohol. You have lung conditions, such as emphysema, pneumonia, or COPD. You have sleep apnea. What are the signs or symptoms? Common symptoms of this condition include: A feeling that your heart is beating very fast. Chest pain or discomfort. Feeling short of breath. Suddenly feeling light-headed or weak. Getting tired easily during activity. Fainting. Sweating. In some cases, there are no symptoms. How is this treated? Treatment for this condition depends on underlying conditions and how you feel when you have atrial fibrillation. They include: Medicines to: Prevent blood clots. Treat heart rate or heart rhythm problems. Using devices, such as a pacemaker, to correct heart rhythm problems. Doing surgery to remove the part of the heart that sends bad signals. Closing an area where clots can form in the heart (left atrial appendage). In some cases, your doctor will treat other underlying conditions. Follow these instructions at home: Medicines Take  over-the-counter and prescription medicines only as told by your doctor. Do not take any new medicines without first talking to your doctor. If you are taking blood thinners: Talk with your doctor before you take any medicines that have aspirin or NSAIDs, such as ibuprofen, in them. Take your medicine exactly as told by your doctor. Take it at the same time each day. Avoid activities that could hurt or bruise you. Follow instructions about how to prevent falls. Wear a bracelet that says you are taking blood thinners. Or, carry a card that lists what medicines you take. Lifestyle     Do not use any products that have nicotine or tobacco in them. These include cigarettes, e-cigarettes, and chewing tobacco. If you need help quitting, ask your doctor. Eat heart-healthy foods. Talk with your doctor about the right eating plan for you. Exercise regularly as told by your doctor. Do not drink alcohol. Lose weight if you are overweight. Do not use drugs, including cannabis. General instructions If you have a condition that causes breathing to stop for a short period of time (apnea), treat it as told by your doctor. Keep a healthy weight. Do not use diet pills unless your doctor says they are safe for you. Diet pills may make heart problems worse. Keep all follow-up visits as told by your doctor. This is important. Contact a doctor if: You notice a change in the speed, rhythm, or strength of your heartbeat. You are taking a blood-thinning medicine and you get more bruising. You get tired more easily when you move or exercise. You have a sudden change in weight. Get help right away if:  You have pain in  your chest or your belly (abdomen). You have trouble breathing. You have side effects of blood thinners, such as blood in your vomit, poop (stool), or pee (urine), or bleeding that cannot stop. You have any signs of a stroke. "BE FAST" is an easy way to remember the main warning signs: B -  Balance. Signs are dizziness, sudden trouble walking, or loss of balance. E - Eyes. Signs are trouble seeing or a change in how you see. F - Face. Signs are sudden weakness or loss of feeling in the face, or the face or eyelid drooping on one side. A - Arms. Signs are weakness or loss of feeling in an arm. This happens suddenly and usually on one side of the body. S - Speech. Signs are sudden trouble speaking, slurred speech, or trouble understanding what people say. T - Time. Time to call emergency services. Write down what time symptoms started. You have other signs of a stroke, such as: A sudden, very bad headache with no known cause. Feeling like you may vomit (nausea). Vomiting. A seizure. These symptoms may be an emergency. Do not wait to see if the symptoms will go away. Get medical help right away. Call your local emergency services (911 in the [place] Do not drive yourself to the hospital. Summary Atrial fibrillation is a type of heartbeat that is irregular or fast. You are at higher risk of this condition if you smoke, are older, have diabetes, or are overweight. Follow your doctor's instructions about medicines, diet, exercise, and follow-up visits. Get help right away if you have signs or symptoms of a stroke. Get help right away if you cannot catch your breath, or you have chest pain or discomfort. This information is not intended to replace advice given to you by your health care provider. Make sure you discuss any questions you have with your health care provider. Document Revised: 03/16/2019 Document Reviewed: 03/16/2019 Elsevier Patient Education  [employer].

## 2022-11-04 NOTE — Consult Note (Signed)
Reason for Consult: Atrial fibrillation with rapid ventricular response Referring Physician: [facility] hospitalist  Rinaldo Philmore Javarian Jakubiak. is an 77 y.o. male.  HPI: Patient is 77 year old male with past medical history significant for hypertension, hyperlipidemia, history of cirrhosis of liver, benign hypertrophy of prostate, fibromyalgia, GERD, irritable bowel syndrome, obstructive sleep apnea, was referred to ED from PCPs office as patient was noted to be in A-fib with RVR.  Patient complains of feeling tired fatigue for almost 1 month.  Patient denies any palpitation lightheadedness or syncope.  Complains of vague localized left-sided chest pain lasting few minutes off-and-on without associated symptoms.  Patient denies any exertional anginal chest pain.  Patient denies any PND orthopnea leg swelling.  States has not had any alcoholic drink for last 5 years and quit smoking 40+ years ago.  Past Medical History:  Diagnosis Date   BPH (benign prostatic hyperplasia)    Fibromyalgia    GERD (gastroesophageal reflux disease)    Hyperlipidemia    Hypertension    Hypogonadism male    IBS (irritable bowel syndrome)    OSA (obstructive sleep apnea)     Past Surgical History:  Procedure Laterality Date    nasal smr np3  1985   CATARACT EXTRACTION, BILATERAL Bilateral 2021   Dr. Katy Fitch, L 5/27, R 3/25   COLONOSCOPY     KNEE ARTHROSCOPY Left 1999   SKIN CANCER EXCISION  2020   nose   SPINE SURGERY  2007   L5 S 1 Disk   SPINE SURGERY  09/2019   Compression fracture stabilization by Dr. Eber Jones  1983    Family History  Problem Relation Age of Onset   Cancer Mother        breast   Cancer Father        lung   Diabetes Father    Heart disease Sister    Arthritis Sister    Colon cancer Neg Hx    Colon polyps Neg Hx     Social History:  reports that he has never smoked. He has never used smokeless tobacco. He reports that he does not currently use alcohol. He reports that  he does not use drugs.  Allergies:  Allergies  Allergen Reactions   Asa [Aspirin] Diarrhea, Nausea Only and Other (See Comments)    History of ulcers, also   Atorvastatin Other (See Comments)    Muscle aches   Celebrex [Celecoxib] Other (See Comments)    Headaches    Codeine Nausea And Vomiting   Savella [Milnacipran Hcl] Other (See Comments)    Reaction not recalled   Viagra [Sildenafil Citrate] Other (See Comments)    Headaches   Penicillins Rash    Medications: I have reviewed the patient's current medications.  Results for orders placed or performed during the hospital encounter of 11/04/22 (from the past 48 hour(s))  Basic metabolic panel     Status: Abnormal   Collection Time: 11/04/22  1:48 PM  Result Value Ref Range   Sodium 139 135 - 145 mmol/L   Potassium 4.0 3.5 - 5.1 mmol/L    Comment: HEMOLYSIS AT THIS LEVEL MAY AFFECT RESULT   Chloride 109 98 - 111 mmol/L   CO2 22 22 - 32 mmol/L   Glucose, Bld 79 70 - 99 mg/dL    Comment: Glucose reference range applies only to samples taken after fasting for at least 8 hours.   BUN 7 (L) 8 - 23 mg/dL   Creatinine, Ser 1.50 (H) 0.61 -  1.24 mg/dL   Calcium 8.9 8.9 - 10.3 mg/dL   GFR, Estimated 49 (L) >60 mL/min    Comment: (NOTE) Calculated using the CKD-EPI Creatinine Equation (2021)    Anion gap 8 5 - 15    Comment: Performed at [facility], [place] [address] [place], [place] [address]  CBC     Status: Abnormal   Collection Time: 11/04/22  1:48 PM  Result Value Ref Range   WBC 5.5 4.0 - 10.5 K/uL   RBC 4.36 4.22 - 5.81 MIL/uL   Hemoglobin 13.8 13.0 - 17.0 g/dL   HCT 40.7 39.0 - 52.0 %   MCV 93.3 80.0 - 100.0 fL   MCH 31.7 26.0 - 34.0 pg   MCHC 33.9 30.0 - 36.0 g/dL   RDW 14.6 11.5 - 15.5 %   Platelets 116 (L) 150 - 400 K/uL   nRBC 0.0 0.0 - 0.2 %    Comment: Performed at [facility], [place] [address] [place], [place] [address]  Troponin I (High Sensitivity)     Status: None   Collection Time: 11/04/22   1:48 PM  Result Value Ref Range   Troponin I (High Sensitivity) 8 <18 ng/L    Comment: (NOTE) Elevated high sensitivity troponin I (hsTnI) values and significant  changes across serial measurements may suggest ACS but many other  chronic and acute conditions are known to elevate hsTnI results.  Refer to the "Links" section for chest pain algorithms and additional  guidance. Performed at [facility], [place] [address] [place], [place] [address]     DG Chest Portable 1 View  Result Date: 11/04/2022 CLINICAL DATA:  Shortness of breath EXAM: PORTABLE CHEST 1 VIEW COMPARISON:  None Available. FINDINGS: No pleural effusion. No pneumothorax. No focal airspace opacity. Unchanged cardiac and mediastinal contours. Visualized upper abdomen is unremarkable. No displaced rib fractures. IMPRESSION: No focal airspace opacity. Electronically Signed   By: Marin Roberts M.D.   On: 11/04/2022 14:59    Review of Systems  Constitutional:  Negative for diaphoresis and fatigue.  HENT:  Negative for sore throat.   Eyes:  Negative for discharge.  Respiratory:  Negative for cough and shortness of breath.   Cardiovascular:  Positive for chest pain. Negative for palpitations and leg swelling.  Gastrointestinal:  Negative for abdominal pain.  Genitourinary:  Negative for difficulty urinating.  Neurological:  Negative for dizziness.   Blood pressure 100/66, pulse 69, temperature 97.9 F (36.6 C), temperature source Oral, resp. rate 18, SpO2 100 %. Physical Exam Constitutional:      Appearance: He is well-developed.  HENT:     Head: Normocephalic and atraumatic.  Eyes:     Extraocular Movements: Extraocular movements intact.     Pupils: Pupils are equal, round, and reactive to light.  Cardiovascular:     Rate and Rhythm: Tachycardia present. Rhythm irregular.     Heart sounds: Murmur (1/6 systolic murmur noted) heard.     Systolic murmur is present.  Pulmonary:     Effort: Pulmonary effort is  normal.     Breath sounds: Normal breath sounds.  Abdominal:     General: Bowel sounds are normal.     Palpations: Abdomen is soft.  Skin:    General: Skin is warm and dry.  Neurological:     General: No focal deficit present.     Mental Status: He is alert and oriented to person, place, and time.     Assessment/Plan: New onset  A-fib with RVR CHA2DS2-VASc score of 2 Status post noncardiac chest pain Hypertension Hyperlipidemia Obstructive sleep apnea Irritable bowel syndrome Benign hypertrophy of prostate History of cirrhosis of the liver Fibromyalgia Plan Check serial enzymes and EKG Check lipid panel/TSH Check 2D echo Agree with p.o. metoprolol and wean off diltiazem Discussed with patient and his daughter at length various options of treatment i.e. chronic anticoagulation with rate control versus rhythm control using TEE cardioversion as patient may have A-fib for approximately 1 month versus rate control and chronic anticoagulation for now and then chemical cardioversion after 4 weeks, agrees for conservative approach with chemical cardioversion after 4 weeks as outpatient.  If still very symptomatic we will consider.  Synchronized DC cardioversion as outpatient.  May switch heparin to Eliquis and DC aspirin if okay with GI Agree with present management  Charolette Forward 11/04/2022, 5:36 PM

## 2022-11-04 NOTE — ED Notes (Signed)
Report receive by previous RN. Pt remains on VS monitoring. HR 100-110s. BP stable 123/109. Pt IV heparin infusing. Call bell within reach. Pt denies any additional needs.

## 2022-11-04 NOTE — ED Notes (Signed)
Pt finished with meal, laid pt down in bed, repeat blood pressure, pt is awake, skin is warm and dry, repeat bp is 76/46 md notified

## 2022-11-04 NOTE — ED Notes (Signed)
D/c dilt gtt per md instructions

## 2022-11-04 NOTE — ED Notes (Signed)
Pt awaiting transfer from room 27 to room 6

## 2022-11-04 NOTE — Progress Notes (Signed)
MEDICARE ANNUAL WELLNESS VISIT AND FOLLOW UP Assessment:   Encounter for Medicare annual wellness exam Due annually  Atherosclerosis of aorta Control blood pressure, cholesterol, glucose, increase exercise.   Essential hypertension Continue ASA, Enalapril Discussed DASH (Dietary Approaches to Stop Hypertension) DASH diet is lower in sodium than a typical American diet. Cut back on foods that are high in saturated fat, cholesterol, and trans fats. Eat more whole-grain foods, fish, poultry, and nuts Remain active and exercise as tolerated daily.  Monitor BP at home-Call if greater than 130/80.  Check CMP/CBC  Hyperlipidemia Continue Rosuvastatin Discussed lifestyle modifications. Recommended diet heavy in fruits and veggies, omega 3's. Decrease consumption of animal meats, cheeses, and dairy products. Remain active and exercise as tolerated. Continue to monitor. Check lipids/TSH  Prediabetes Education: Reviewed 'ABCs' of diabetes management  Discussed goals to be met and/or maintained include A1C (<7) Blood pressure (<130/80) Cholesterol (LDL <70) Continue Eye Exam yearly  Continue Dental Exam Q6 mo Discussed dietary recommendations Discussed Physical Activity recommendations Foot exam UTD Check A1C  Depression, major, in active ([place]) Continue follow up with psych/counseling - Dr. Charlott Holler Continue medications :  diet/exerise, sleep hygiene, stress management, hydration Sertraline QD, Lorazepam PRN  OSA on CPAP Continue CPAP  Gastroesophageal reflux disease, esophagitis presence not specified No suspected reflux complications ([place]/stricture). Lifestyle modification:  wt loss, avoid meals 2-3h before bedtime. Consider eliminating food triggers:  chocolate, caffeine, EtOH, acid/spicy food.  IBS (irritable bowel syndrome) Diet controlled Avoid triggers  Fibromyalgia Remain active Methocarbamol PRN  BPH (benign prostatic hyperplasia) Continue to  monitor  Vitamin D deficiency Continue supplement Monitor levels  Medication management All medications discussed and reviewed in full. All questions and concerns regarding medications addressed.    CKD 3  Continue ACE, bASA, statin Discussed how what you eat and drink can aide in kidney protection. Stay well hydrated. Avoid high salt foods. Avoid NSAIDS. Keep BP and BG well controlled.   Take medications as prescribed. Remain active and exercise as tolerated daily. Maintain weight.  Continue to monitor. 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 Arnoldo Morale 09/2019  Continue Tylenol, Voltaren gel. Rest when flared. Declines referral for further management at this time   Moderate fall risk  R/t recent injury with fall; has completed PT, declines referral back  Falls prevention reviewed; information provided  Personal history of skin cancer Continue follow up with dermatology   Screening for colon cancer Colonoscopy ordered - Due 202  New onset afib Sent to ER for via EMS for further review and evaluation d/t CP and HR fluctuation between 150-180.  Orders Placed This Encounter  Procedures   CBC with Differential/Platelet   COMPLETE METABOLIC PANEL WITH GFR   Lipid panel   Hemoglobin A1c   EKG 12-Lead   Notify office for further evaluation and treatment, questions or concerns if any reported s/s fail to improve.   The patient was advised to call back or seek an in-person evaluation if any symptoms worsen or if the condition fails to improve as anticipated.   Further disposition pending results of labs. Discussed med's effects and SE's.    I discussed the assessment and treatment plan with the patient. The patient was provided an opportunity to ask questions and all were answered. The patient  agreed with the plan and demonstrated an understanding of the instructions.  Discussed  med's effects and SE's. Screening labs and tests as requested with regular follow-up as recommended.  I provided 45 minutes of face-to-face time during this encounter including counseling, chart review, and critical decision making was preformed.   Future Appointments  Date Time Provider [place]  11/17/2022 10:00 AM Ward Givens, NP [facility] None  12/04/2022  8:00 AM Shanon Ace, LCSW [facility] None  12/08/2022 10:00 AM Vladimir Crofts, PA-C [facility]  03/04/2023 10:00 AM [name], MD [facility] None     Plan:   During the course of the visit the patient was educated and counseled about appropriate screening and preventive services including:   Pneumococcal vaccine  Influenza vaccine Prevnar 13 Td vaccine Screening electrocardiogram Colorectal cancer screening Diabetes screening Glaucoma screening Nutrition counseling    Subjective:  Cody Hines. is a 77 y.o. male who presents for Medicare Annual Wellness Visit and 3 month follow up for HTN, hyperlipidemia, prediabetes, and vitamin D Def.   During today's exam patient was noted to have irregularly irregulary heart beat with elevation to HR.  EKG was performed to show new onset atrial fibrillation.  He has been asymptomatic other than having chest pain once diagnosed, most likely r/t stress of going to ER.  He does not having an URI over the last month.  He does not currently follow with cardiology.  BP well controlled.  He has been separated from wife for nearly 2 years until fall 2020, fell off of a ladder and had T12 compression fracture, underwent surgical correction by Dr. Arnoldo Morale early December 2020, moved back in with wife to avoid SNF. He reports improved for a few weeks, but recurrent pain, taking tylenol arthritis. Declines further interventions at this time.   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 and a therapist this as well, he is on zoloft, and his depression is somewhat better, now doing therapy once a month with Cody Hines. Daughter is psychologist. He is on lorazepam PRN.  Takes sparingly.   On CPAP for OSA , reports 100% compliance and that this is helping.   He has personal history of skin cancer to nose and follows with dermatology. Denies any new concerns today.    BMI is Body mass index is 27.28 kg/m., he has not been working on diet and exercise. Wt Readings from Last 3 Encounters:  11/04/22 206 lb 12.8 oz (93.8 kg)  07/24/22 207 lb 3.2 oz (94 kg)  04/22/22 208 lb (94.3 kg)   His blood pressure has been controlled at home, today their BP is BP: 132/82  He does not workout. He denies chest pain, shortness of breath, dizziness.  Aortic atherosclerosis per CT 09/2019.    He is on cholesterol medication and denies myalgias. His cholesterol is at goal. The cholesterol last visit was:   Lab Results  Component Value Date   CHOL 124 07/24/2022   HDL 52 07/24/2022   LDLCALC 56 07/24/2022   TRIG 75 07/24/2022   CHOLHDL 2.4 07/24/2022    He has been working on diet and exercise for prediabetes, and denies paresthesia of the feet, polydipsia, polyuria and visual disturbances. Last A1C in the office was:  Lab Results  Component Value Date   HGBA1C 5.3 07/24/2022   He estimates 80 fluid ounces of water intake, CKD III range, GFR 40 range predating 2017 on review. Last 3  GFR:  Lab Results  Component Value Date   GFRNONAA 52 (L) 06/25/2021   GFRNONAA 47 (L) 06/23/2021   GFRNONAA 43 (L) 06/22/2021   Patient is on Vitamin D supplement.   Lab Results  Component Value Date   VD25OH 34 07/24/2022       Medication Review: Current Outpatient Medications on File Prior to Visit  Medication Sig Dispense Refill   acetaminophen (TYLENOL) 650 MG  CR tablet Take 650 mg by mouth every 8 (eight) hours as needed for pain (or back pain or migraines).      aspirin EC 81 MG tablet Take 81 mg by mouth daily.     enalapril (VASOTEC) 20 MG tablet TAKE 1 TABLET BY MOUTH  DAILY FOR BLOOD PRESSURE 120 tablet 2   lactulose (CHRONULAC) 10 GM/15ML solution Take 15 mL up to 3 times daily, goal is to have 2-3 bowel movements daily. 1800 mL 1   LORazepam (ATIVAN) 1 MG tablet TAKE 1 TABLET BY MOUTH EACH NIGHT AT BEDTIME FOR SLEEP 30 tablet 3   methocarbamol (ROBAXIN) 500 MG tablet TAKE 1 TABLET BY MOUTH EVERY 6 HOURS AS NEEDED FOR MUSCLE SPASMS 20 tablet 0   Multiple Vitamin (MULTIVITAMIN) capsule Take 1 capsule by mouth daily.     rosuvastatin (CRESTOR) 40 MG tablet Take  1 tablet  Daily  for Cholesterol 90 tablet 3   sertraline (ZOLOFT) 100 MG tablet Take 1 tablet (100 mg total) by mouth in the morning and at bedtime. 180 tablet 3   No current facility-administered medications on file prior to visit.    Allergies: Allergies  Allergen Reactions   Asa [Aspirin] Diarrhea, Nausea Only and Other (See Comments)    History of ulcers, also   Atorvastatin Other (See Comments)    Muscle aches   Celebrex [Celecoxib] Other (See Comments)    Headaches    Codeine Nausea And Vomiting   Savella [Milnacipran Hcl] Other (See Comments)    Reaction not recalled   Viagra [Sildenafil Citrate] Other (See Comments)    Headaches   Penicillins Rash    Current Problems (verified) has Essential hypertension; GERD (gastroesophageal reflux disease); IBS (irritable bowel syndrome); Fibromyalgia; BPH (benign prostatic hyperplasia); Testosterone deficiency; Medication management; Vitamin D deficiency; Benign essential tremor; Chronic lumbar pain; Major depressive disorder, recurrent episode, moderate ([place]); Hyperlipidemia, mixed; Personal history of skin cancer; At moderate risk for fall; Compression fracture of T12 vertebra ([place]); Aortic atherosclerosis ([place]) by CT Scan on  06/22/2019; Small right kidney; Closed L2 vertebral fracture ([place]); Closed L3 vertebral fracture ([place]); Contact with powered lawnmower as cause of accidental injury at home as place of occurrence; CKD (chronic kidney disease) stage 3, GFR 30-59 ml/min (HCC); OSA on CPAP; MCI (mild cognitive impairment); Pulmonary nodule; and Malnutrition of moderate degree on their problem list.  Screening Tests Immunization History  Administered Date(s) Administered   PFIZER(Purple Top)SARS-COV-2 Vaccination 11/19/2019, 12/11/2019   Pneumococcal Conjugate-13 09/17/2018   Pneumococcal Polysaccharide-23 09/26/2020   Pneumococcal-Unspecified 07/16/2011   Tdap 01/13/2010   Zoster, Live 08/09/2013   Preventative care: Last colonoscopy: 04/2022 - Due 10 years CXR 2009 CT AB 06/2019 - aortic atherosclerosis, small R kidney Sleep study 06/2017  Prior vaccinations: TD or Tdap: 2011 defer due to cost, get PRN Influenza: declines  Pneumococcal: 2012, TODAY  Prevnar13: 2019 Shingles/Zostavax: 2014 Covid 19: 2/3, 2021, pfizer  Names of Other Physician/Practitioners you currently use: 1. [facility] here for primary care 2. Dr Katy Fitch, eye doctor, last visit  2022 - Due 3. Dr Radford Pax, DDS , dentist, last visit 2023   Patient Care Team: [name], MD as PCP - General (Internal Medicine) Druscilla Brownie, MD as Consulting Physician (Dermatology) Teena Irani, MD (Inactive) as Consulting Physician (Gastroenterology) Newman Pies, MD as Consulting Physician (Neurosurgery) Clent Jacks, MD as Consulting Physician (Ophthalmology) [name], MD (Internal Medicine)  Surgical: He  has a past surgical history that includes Spine surgery (2007); Knee arthroscopy (Left, 1999);  nasal smr np3 (1985); Vasectomy (1983); Skin cancer excision (2020); Spine surgery (09/2019); Cataract extraction, bilateral (Bilateral, 2021); and Colonoscopy. Family His family history  includes Arthritis in his sister; Cancer in his father and mother; Diabetes in his father; Heart disease in his sister. Social history  He reports that he has never smoked. He has never used smokeless tobacco. He reports that he does not currently use alcohol. He reports that he does not use drugs.  MEDICARE WELLNESS OBJECTIVES: Physical activity:   Cardiac risk factors:   Depression/mood screen:      11/04/2022    1:09 PM  Depression screen PHQ 2/9  Decreased Interest 0  Down, Depressed, Hopeless 0  PHQ - 2 Score 0    ADLs:     11/04/2022   11:03 AM 07/23/2022   10:33 PM  In your present state of health, do you have any difficulty performing the following activities:  Hearing? 0 0  Vision? 0 0  Difficulty concentrating or making decisions? 0 0  Walking or climbing stairs? 1 0  Comment Back pain   Dressing or bathing? 0 0  Doing errands, shopping? 0 0  Preparing Food and eating ? N   Using the Toilet? N   In the past six months, have you accidently leaked urine? N   Do you have problems with loss of bowel control? N   Managing your Medications? N   Managing your Finances? N   Housekeeping or managing your Housekeeping? N      Cognitive Testing  Alert? Yes  Normal Appearance?Yes  Oriented to person? Yes  Place? Yes   Time? Yes  Recall of three objects?  Yes  Can perform simple calculations? Yes  Displays appropriate judgment?Yes  Can read the correct time from a watch face?Yes  EOL planning: Does Patient Have a Medical Advance Directive?: No   Objective:   Today's Vitals   11/04/22 1042  BP: 132/82  Pulse: 85  Temp: 97.7 F (36.5 C)  SpO2: 99%  Weight: 206 lb 12.8 oz (93.8 kg)  Height: '6\' 1"'$  (1.854 m)    Body mass index is 27.28 kg/m.  General appearance: alert, no distress, WD/WN, male HEENT: normocephalic, sclerae anicteric, TMs pearly, nares patent, no discharge or erythema, pharynx normal Oral cavity: MMM, no lesions Neck: supple, no  lymphadenopathy, no thyromegaly, no masses Heart: RRR, normal S1, S2, no murmurs Lungs: CTA bilaterally, no wheezes, rhonchi, or rales Abdomen: +bs, soft, non tender, non distended, no masses, no hepatomegaly, no splenomegaly Musculoskeletal: nontender, no swelling, no obvious deformity Extremities: no edema, no cyanosis, no clubbing Pulses: 2+ symmetric, upper and lower extremities, normal cap refill Neurological: alert, oriented x 3, CN2-12 intact, strength normal upper extremities and lower extremities, sensation normal throughout, DTRs 2+ throughout, no cerebellar signs, gait normal. Has chronic resting tremor of LUE.  Psychiatric: depressed affect, behavior normal, pleasant   Medicare Attestation I have personally reviewed: The patient's medical and social history Their use of alcohol, tobacco or illicit drugs Their current medications and supplements  The patient's functional ability including ADLs,fall risks, home safety risks, cognitive, and hearing and visual impairment Diet and physical activities Evidence for depression or mood disorders  The patient's weight, height, BMI, and visual acuity have been recorded in the chart.  I have made referrals, counseling, and provided education to the patient based on review of the above and I have provided the patient with a written personalized care plan for preventive services.     Darrol Jump, NP   11/04/2022

## 2022-11-05 ENCOUNTER — Inpatient Hospital Stay (HOSPITAL_COMMUNITY): Payer: Medicare Other

## 2022-11-05 DIAGNOSIS — R9431 Abnormal electrocardiogram [ECG] [EKG]: Secondary | ICD-10-CM | POA: Diagnosis not present

## 2022-11-05 DIAGNOSIS — I480 Paroxysmal atrial fibrillation: Secondary | ICD-10-CM | POA: Diagnosis not present

## 2022-11-05 LAB — HEMOGLOBIN A1C
Hgb A1c MFr Bld: 5.5 % of total Hgb (ref <5.7)
Mean Plasma Glucose: 111 mg/dL
eAG (mmol/L): 6.2 mmol/L

## 2022-11-05 LAB — CBC WITH DIFFERENTIAL/PLATELET
Absolute Monocytes: 455 cells/uL (ref 200–950)
Basophils Absolute: 20 cells/uL (ref 0–200)
Basophils Relative: 0.4 %
Eosinophils Absolute: 150 cells/uL (ref 15–500)
Eosinophils Relative: 3 %
HCT: 40.6 % (ref 38.5–50.0)
Hemoglobin: 13.9 g/dL (ref 13.2–17.1)
Lymphs Abs: 1880 cells/uL (ref 850–3900)
MCH: 31.7 pg (ref 27.0–33.0)
MCHC: 34.2 g/dL (ref 32.0–36.0)
MCV: 92.7 fL (ref 80.0–100.0)
MPV: 10.9 fL (ref 7.5–12.5)
Monocytes Relative: 9.1 %
Neutro Abs: 2495 cells/uL (ref 1500–7800)
Neutrophils Relative %: 49.9 %
Platelets: 121 10*3/uL — ABNORMAL LOW (ref 140–400)
RBC: 4.38 10*6/uL (ref 4.20–5.80)
RDW: 13.2 % (ref 11.0–15.0)
Total Lymphocyte: 37.6 %
WBC: 5 10*3/uL (ref 3.8–10.8)

## 2022-11-05 LAB — ECHOCARDIOGRAM COMPLETE
Area-P 1/2: 3.93 cm2
Calc EF: 47.8 %
Height: 73 in
S' Lateral: 3.3 cm
Single Plane A2C EF: 46.4 %
Single Plane A4C EF: 43.8 %
Weight: 3280 oz

## 2022-11-05 LAB — HEPARIN LEVEL (UNFRACTIONATED)
Heparin Unfractionated: 0.35 IU/mL (ref 0.30–0.70)
Heparin Unfractionated: 0.59 IU/mL (ref 0.30–0.70)

## 2022-11-05 LAB — CBC
HCT: 40 % (ref 39.0–52.0)
Hemoglobin: 13.5 g/dL (ref 13.0–17.0)
MCH: 31.5 pg (ref 26.0–34.0)
MCHC: 33.8 g/dL (ref 30.0–36.0)
MCV: 93.5 fL (ref 80.0–100.0)
Platelets: 131 10*3/uL — ABNORMAL LOW (ref 150–400)
RBC: 4.28 MIL/uL (ref 4.22–5.81)
RDW: 14.9 % (ref 11.5–15.5)
WBC: 6.9 10*3/uL (ref 4.0–10.5)
nRBC: 0 % (ref 0.0–0.2)

## 2022-11-05 LAB — COMPLETE METABOLIC PANEL WITH GFR
AG Ratio: 1.3 (calc) (ref 1.0–2.5)
ALT: 22 U/L (ref 9–46)
AST: 42 U/L — ABNORMAL HIGH (ref 10–35)
Albumin: 3.6 g/dL (ref 3.6–5.1)
Alkaline phosphatase (APISO): 106 U/L (ref 35–144)
BUN/Creatinine Ratio: 6 (calc) (ref 6–22)
BUN: 9 mg/dL (ref 7–25)
CO2: 29 mmol/L (ref 20–32)
Calcium: 9.4 mg/dL (ref 8.6–10.3)
Chloride: 109 mmol/L (ref 98–110)
Creat: 1.54 mg/dL — ABNORMAL HIGH (ref 0.70–1.28)
Globulin: 2.8 g/dL (calc) (ref 1.9–3.7)
Glucose, Bld: 114 mg/dL — ABNORMAL HIGH (ref 65–99)
Potassium: 4.1 mmol/L (ref 3.5–5.3)
Sodium: 142 mmol/L (ref 135–146)
Total Bilirubin: 1.7 mg/dL — ABNORMAL HIGH (ref 0.2–1.2)
Total Protein: 6.4 g/dL (ref 6.1–8.1)
eGFR: 47 mL/min/{1.73_m2} — ABNORMAL LOW (ref >=60)

## 2022-11-05 LAB — LIPID PANEL
Cholesterol: 125 mg/dL (ref <200)
HDL: 51 mg/dL (ref >=40)
LDL Cholesterol (Calc): 60 mg/dL (calc)
Non-HDL Cholesterol (Calc): 74 mg/dL (calc) (ref <130)
Total CHOL/HDL Ratio: 2.5 (calc) (ref <5.0)
Triglycerides: 60 mg/dL (ref <150)

## 2022-11-05 LAB — BASIC METABOLIC PANEL
Anion gap: 11 (ref 5–15)
BUN: 11 mg/dL (ref 8–23)
CO2: 22 mmol/L (ref 22–32)
Calcium: 8.9 mg/dL (ref 8.9–10.3)
Chloride: 106 mmol/L (ref 98–111)
Creatinine, Ser: 1.85 mg/dL — ABNORMAL HIGH (ref 0.61–1.24)
GFR, Estimated: 38 mL/min — ABNORMAL LOW (ref >60)
Glucose, Bld: 106 mg/dL — ABNORMAL HIGH (ref 70–99)
Potassium: 3.8 mmol/L (ref 3.5–5.1)
Sodium: 139 mmol/L (ref 135–145)

## 2022-11-05 LAB — PROTIME-INR
INR: 1.5 — ABNORMAL HIGH (ref 0.8–1.2)
Prothrombin Time: 18.3 seconds — ABNORMAL HIGH (ref 11.4–15.2)

## 2022-11-05 MED ORDER — DILTIAZEM HCL 30 MG PO TABS
30.0000 mg | ORAL_TABLET | Freq: Four times a day (QID) | ORAL | Status: DC
  Administered 2022-11-05 – 2022-11-06 (×4): 30 mg via ORAL
  Filled 2022-11-05 (×5): qty 1

## 2022-11-05 MED ORDER — METOPROLOL TARTRATE 25 MG PO TABS
25.0000 mg | ORAL_TABLET | Freq: Two times a day (BID) | ORAL | Status: DC
  Administered 2022-11-05: 25 mg via ORAL
  Filled 2022-11-05 (×2): qty 1

## 2022-11-05 MED ORDER — METOPROLOL TARTRATE 50 MG PO TABS
50.0000 mg | ORAL_TABLET | Freq: Two times a day (BID) | ORAL | Status: DC
  Administered 2022-11-05: 50 mg via ORAL
  Filled 2022-11-05: qty 1

## 2022-11-05 MED ORDER — METOPROLOL TARTRATE 5 MG/5ML IV SOLN
2.5000 mg | Freq: Once | INTRAVENOUS | Status: AC
  Administered 2022-11-05: 2.5 mg via INTRAVENOUS

## 2022-11-05 NOTE — Progress Notes (Signed)
Echocardiogram 2D Echocardiogram has been performed.  Cody Hines 11/05/2022, 10:58 AM

## 2022-11-05 NOTE — ED Notes (Signed)
Assumed care of patient.

## 2022-11-05 NOTE — Progress Notes (Signed)
Subjective:  Patient denies any chest pain or shortness of breath still remains in A-fib with RVR Cardizem drip was DC'd earlier  Objective:  Vital Signs in the last 24 hours: Temp:  [97.7 F (36.5 C)-99.2 F (37.3 C)] 99.2 F (37.3 C) (01/31 0420) Pulse Rate:  [41-149] 126 (01/31 0600) Resp:  [12-31] 20 (01/31 0600) BP: (62-132)/(46-109) 118/75 (01/31 0600) SpO2:  [90 %-100 %] 95 % (01/31 0600) Weight:  [93.8 kg] 93.8 kg (01/30 1042)  Intake/Output from previous day: 01/30 0701 - 01/31 0700 In: 1507.7 [I.V.:50.4; IV Piggyback:1457.3] Out: -  Intake/Output from this shift: No intake/output data recorded.  Physical Exam: Exam unchanged  Lab Results: Recent Labs    11/04/22 1348 11/05/22 0418  WBC 5.5 6.9  HGB 13.8 13.5  PLT 116* 131*   Recent Labs    11/04/22 1348 11/05/22 0418  NA 139 139  K 4.0 3.8  CL 109 106  CO2 22 22  GLUCOSE 79 106*  BUN 7* 11  CREATININE 1.50* 1.85*   No results for input(s): "TROPONINI" in the last 72 hours.  Invalid input(s): "CK", "MB" Hepatic Function Panel Recent Labs    11/04/22 0000  PROT 6.4  AST 42*  ALT 22  BILITOT 1.7*   Recent Labs    11/04/22 0000  CHOL 125   No results for input(s): "PROTIME" in the last 72 hours.  Imaging: Imaging results have been reviewed and DG Chest 1 View  Result Date: 11/04/2022 CLINICAL DATA:  Congestive failure EXAM: PORTABLE CHEST 1 VIEW COMPARISON:  11/04/2022 FINDINGS: Cardiac shadow is mildly prominent. Mild central vascular congestion is seen without edema. No focal infiltrate is noted. No bony abnormality is seen. IMPRESSION: Mild congestive failure. Electronically Signed   By: Inez Catalina M.D.   On: 11/04/2022 20:05   DG Chest Portable 1 View  Result Date: 11/04/2022 CLINICAL DATA:  Shortness of breath EXAM: PORTABLE CHEST 1 VIEW COMPARISON:  None Available. FINDINGS: No pleural effusion. No pneumothorax. No focal airspace opacity. Unchanged cardiac and mediastinal contours.  Visualized upper abdomen is unremarkable. No displaced rib fractures. IMPRESSION: No focal airspace opacity. Electronically Signed   By: Marin Roberts M.D.   On: 11/04/2022 14:59    Cardiac Studies:  Assessment/Plan:  New onset A-fib with RVR CHA2DS2-VASc score of 2 Status post noncardiac chest pain Hypertension Hyperlipidemia Obstructive sleep apnea Irritable bowel syndrome Benign hypertrophy of prostate History of cirrhosis of the liver Fibromyalgia Plan Start Cardizem CD 180 mg p.o. daily Continue metoprolol tartrate 25 mg twice daily Lopressor 2.5 mg IV x 2 now and then every 4 hour as needed for heart rate above 110  LOS: 1 day    Charolette Forward 11/05/2022, 8:16 AM

## 2022-11-05 NOTE — ED Notes (Signed)
ED TO INPATIENT HANDOFF REPORT  ED Nurse Name and Phone #:  Noretta Frier [phone]  S Name/Age/Gender Cody Hines [name]. 77 y.o. male Room/Bed: 006C/006C  Code Status   Code Status: Full Code  Home/SNF/Other Home Patient oriented to: situation Is this baseline? Yes   Triage Complete: Triage complete  Chief Complaint A-fib [facility]) [I48.91]  Triage Note From doctor's office. Chest pain and shortness of breath. A fib with RVR. Given total of '35mg'$  of cardizem per EMS over 3 doses, last given '@1313'$  '15mg'$ . Also given 1L of NS. Chest pressure and shortness of breath has resolved upon arrival. Took '81mg'$  aspirin this morning, cannot have more due to hx of ulcers. Denies history of a fib.   Allergies Allergies  Allergen Reactions   Asa [Aspirin] Diarrhea, Nausea Only and Other (See Comments)    History of ulcers, also   Atorvastatin Other (See Comments)    Muscle aches   Celebrex [Celecoxib] Other (See Comments)    Headaches    Codeine Nausea And Vomiting   Savella [Milnacipran Hcl] Other (See Comments)    Reaction not recalled   Viagra [Sildenafil Citrate] Other (See Comments)    Headaches   Penicillins Rash    Level of Care/Admitting Diagnosis ED Disposition     ED Disposition  Admit   Condition  --   [place]: [place] [[id number]]  Level of Care: Progressive [102]  Admit to Progressive based on following criteria: CARDIOVASCULAR & THORACIC of moderate stability with acute coronary syndrome symptoms/low risk myocardial infarction/hypertensive urgency/arrhythmias/heart failure potentially compromising stability and stable post cardiovascular intervention patients.  May admit patient to [facility] or [facility] if equivalent level of care is available:: No  Covid Evaluation: Asymptomatic - no recent exposure (last 10 days) testing not required  Diagnosis: A-fib [facility]) [[id number]]  Admitting Physician: Lequita Halt [[id number]]  Attending  Physician: Lequita Halt [[id number]]  Certification:: I certify this patient will need inpatient services for at least 2 midnights  Estimated Length of Stay: 2          B Medical/Surgery History Past Medical History:  Diagnosis Date   BPH (benign prostatic hyperplasia)    Fibromyalgia    GERD (gastroesophageal reflux disease)    Hyperlipidemia    Hypertension    Hypogonadism male    IBS (irritable bowel syndrome)    OSA (obstructive sleep apnea)    Past Surgical History:  Procedure Laterality Date    nasal smr np3  1985   CATARACT EXTRACTION, BILATERAL Bilateral 2021   Dr. Katy Fitch, L 5/27, R 3/25   COLONOSCOPY     KNEE ARTHROSCOPY Left 1999   SKIN CANCER EXCISION  2020   nose   SPINE SURGERY  2007   L5 S 1 Disk   SPINE SURGERY  09/2019   Compression fracture stabilization by Dr. Arnoldo Morale   VASECTOMY  1983     A IV Location/Drains/Wounds Patient Lines/Drains/Airways Status     Active Line/Drains/Airways     Name Placement date Placement time Site Days   Peripheral IV 11/04/22 20 G Anterior;Left Hand 11/04/22  --  Hand  1   Peripheral IV 11/04/22 18 G Anterior;Right Forearm 11/04/22  1502  Forearm  1            Intake/Output Last 24 hours  Intake/Output Summary (Last 24 hours) at 11/05/2022 1414 Last data filed at 11/05/2022 0848 Gross per 24 hour  Intake 1756.85 ml  Output --  Net 1756.85 ml    Labs/Imaging Results for orders placed or performed during the hospital encounter of 11/04/22 (from the past 48 hour(s))  Basic metabolic panel     Status: Abnormal   Collection Time: 11/04/22  1:48 PM  Result Value Ref Range   Sodium 139 135 - 145 mmol/L   Potassium 4.0 3.5 - 5.1 mmol/L    Comment: HEMOLYSIS AT THIS LEVEL MAY AFFECT RESULT   Chloride 109 98 - 111 mmol/L   CO2 22 22 - 32 mmol/L   Glucose, Bld 79 70 - 99 mg/dL    Comment: Glucose reference range applies only to samples taken after fasting for at least 8 hours.   BUN 7 (L) 8 - 23 mg/dL    Creatinine, Ser 1.50 (H) 0.61 - 1.24 mg/dL   Calcium 8.9 8.9 - 10.3 mg/dL   GFR, Estimated 49 (L) >60 mL/min    Comment: (NOTE) Calculated using the CKD-EPI Creatinine Equation (2021)    Anion gap 8 5 - 15    Comment: Performed at [facility] [address], [place], [place] [address]  CBC     Status: Abnormal   Collection Time: 11/04/22  1:48 PM  Result Value Ref Range   WBC 5.5 4.0 - 10.5 K/uL   RBC 4.36 4.22 - 5.81 MIL/uL   Hemoglobin 13.8 13.0 - 17.0 g/dL   HCT 40.7 39.0 - 52.0 %   MCV 93.3 80.0 - 100.0 fL   MCH 31.7 26.0 - 34.0 pg   MCHC 33.9 30.0 - 36.0 g/dL   RDW 14.6 11.5 - 15.5 %   Platelets 116 (L) 150 - 400 K/uL   nRBC 0.0 0.0 - 0.2 %    Comment: Performed at [facility], [place] [address], [place], [place] [address]  Troponin I (High Sensitivity)     Status: None   Collection Time: 11/04/22  1:48 PM  Result Value Ref Range   Troponin I (High Sensitivity) 8 <18 ng/L    Comment: (NOTE) Elevated high sensitivity troponin I (hsTnI) values and significant  changes across serial measurements may suggest ACS but many other  chronic and acute conditions are known to elevate hsTnI results.  Refer to the "Links" section for chest pain algorithms and additional  guidance. Performed at [facility], [place] [address], [place], [place] [address]   Troponin I (High Sensitivity)     Status: None   Collection Time: 11/04/22  5:34 PM  Result Value Ref Range   Troponin I (High Sensitivity) 10 <18 ng/L    Comment: (NOTE) Elevated high sensitivity troponin I (hsTnI) values and significant  changes across serial measurements may suggest ACS but many other  chronic and acute conditions are known to elevate hsTnI results.  Refer to the "Links" section for chest pain algorithms and additional  guidance. Performed at [facility], [place] [address], [place], [place] [address]   Protime-INR     Status: Abnormal   Collection Time: 11/04/22  5:34 PM  Result Value  Ref Range   Prothrombin Time 17.4 (H) 11.4 - 15.2 seconds   INR 1.4 (H) 0.8 - 1.2    Comment: (NOTE) INR goal varies based on device and disease states. Performed at [facility], [place] [address] [place], [place] [address]   TSH     Status: None   Collection Time: 11/04/22  5:34 PM  Result Value Ref Range   TSH 2.068 0.350 -  4.500 uIU/mL    Comment: Performed by a 3rd Generation assay with a functional sensitivity of <=0.01 uIU/mL. Performed at [facility], [place] [address] [place], [place] [address]   Magnesium     Status: None   Collection Time: 11/04/22  5:34 PM  Result Value Ref Range   Magnesium 2.0 1.7 - 2.4 mg/dL    Comment: Performed at [facility], [place] [address], [place], [place] [address]  Heparin level (unfractionated)     Status: None   Collection Time: 11/05/22 12:01 AM  Result Value Ref Range   Heparin Unfractionated 0.59 0.30 - 0.70 IU/mL    Comment: (NOTE) The clinical reportable range upper limit is being lowered to >1.10 to align with the FDA approved guidance for the current laboratory assay.  If heparin results are below expected values, and patient dosage has  been confirmed, suggest follow up testing of antithrombin III levels. Performed at [facility], [place] [address], [place], [place] [address]   Basic metabolic panel     Status: Abnormal   Collection Time: 11/05/22  4:18 AM  Result Value Ref Range   Sodium 139 135 - 145 mmol/L   Potassium 3.8 3.5 - 5.1 mmol/L   Chloride 106 98 - 111 mmol/L   CO2 22 22 - 32 mmol/L   Glucose, Bld 106 (H) 70 - 99 mg/dL    Comment: Glucose reference range applies only to samples taken after fasting for at least 8 hours.   BUN 11 8 - 23 mg/dL   Creatinine, Ser 1.85 (H) 0.61 - 1.24 mg/dL   Calcium 8.9 8.9 - 10.3 mg/dL   GFR, Estimated 38 (L) >60 mL/min    Comment: (NOTE) Calculated using the CKD-EPI Creatinine Equation (2021)    Anion gap 11 5 - 15    Comment: Performed at [facility] [address] [place], [place] [address]  Protime-INR     Status: Abnormal   Collection Time: 11/05/22  4:18 AM  Result Value Ref Range   Prothrombin Time 18.3 (H) 11.4 - 15.2 seconds   INR 1.5 (H) 0.8 - 1.2    Comment: (NOTE) INR goal varies based on device and disease states. Performed at [facility], [place] [address], [place], [place] [address]   Heparin level (unfractionated)     Status: None   Collection Time: 11/05/22  4:18 AM  Result Value Ref Range   Heparin Unfractionated 0.35 0.30 - 0.70 IU/mL    Comment: (NOTE) The clinical reportable range upper limit is being lowered to >1.10 to align with the FDA approved guidance for the current laboratory assay.  If heparin results are below expected values, and patient dosage has  been confirmed, suggest follow up testing of antithrombin III levels. Performed at [facility], [place] [address] [place], [place] [address]   CBC     Status: Abnormal   Collection Time: 11/05/22  4:18 AM  Result Value Ref Range   WBC 6.9 4.0 - 10.5 K/uL   RBC 4.28 4.22 - 5.81 MIL/uL   Hemoglobin 13.5 13.0 - 17.0 g/dL   HCT 40.0 39.0 - 52.0 %   MCV 93.5 80.0 - 100.0 fL   MCH 31.5 26.0 - 34.0 pg   MCHC 33.8 30.0 - 36.0 g/dL   RDW 14.9 11.5 - 15.5 %   Platelets 131 (L) 150 - 400 K/uL    Comment: REPEATED TO VERIFY   nRBC  0.0 0.0 - 0.2 %    Comment: Performed at [facility], [place] [address] [place], [place] [address]   ECHOCARDIOGRAM COMPLETE  Result Date: 11/05/2022    ECHOCARDIOGRAM REPORT   Patient Name:   Tanor Hines Oracio Galen. Date of Exam: 11/05/2022 Medical Rec #:  [id number]                   Height:       73.0 in Accession #:    [id number]                  Weight:       206.8 lb Date of Birth:  1946-09-16                  BSA:          2.182 m Patient Age:    4 years                    BP:           103/88 mmHg Patient Gender: M                           HR:           112 bpm. Exam Location:  Inpatient  Procedure: 2D Echo, Cardiac Doppler and Color Doppler Indications:    R94.31 Abnormal EKG  History:        Patient has no prior history of Echocardiogram examinations.                 Risk Factors:Hypertension and Dyslipidemia.  Sonographer:    Phineas Douglas Referring Phys: [id number] [place]  1. Left ventricular ejection fraction, by estimation, is 45 to 50%. The left ventricle has mildly decreased function. The left ventricle demonstrates global hypokinesis. There is mild concentric left ventricular hypertrophy. Left ventricular diastolic parameters are indeterminate.  2. Right ventricular systolic function is normal. The right ventricular size is normal. There is normal pulmonary artery systolic pressure.  3. Right atrial size was severely dilated.  4. There is no evidence of cardiac tamponade.  5. The mitral valve is normal in structure. Trivial mitral valve regurgitation. No evidence of mitral stenosis.  6. The aortic valve is tricuspid. Aortic valve regurgitation is not visualized. No aortic stenosis is present.  7. The inferior vena cava is dilated in size with <50% respiratory variability, suggesting right atrial pressure of 15 mmHg. FINDINGS  Left Ventricle: Left ventricular ejection fraction, by estimation, is 45 to 50%. The left ventricle has mildly decreased function. The left ventricle demonstrates global hypokinesis. The left ventricular internal cavity size was normal in size. There is  mild concentric left ventricular hypertrophy. Left ventricular diastolic parameters are indeterminate. Indeterminate filling pressures. Right Ventricle: The right ventricular size is normal. No increase in right ventricular wall thickness. Right ventricular systolic function is normal. There is normal pulmonary artery systolic pressure. The tricuspid regurgitant velocity is 2.05 m/s, and  with an assumed right atrial pressure of 15 mmHg, the estimated right ventricular systolic pressure is 44.0 mmHg. Left  Atrium: Left atrial size was normal in size. Right Atrium: Right atrial size was severely dilated. Pericardium: Trivial pericardial effusion is present. There is no evidence of cardiac tamponade. Mitral Valve: The mitral valve is normal in structure. Trivial mitral valve regurgitation. No evidence of mitral valve stenosis. Tricuspid Valve: The tricuspid valve is normal in structure.  Tricuspid valve regurgitation is mild . No evidence of tricuspid stenosis. Aortic Valve: The aortic valve is tricuspid. Aortic valve regurgitation is not visualized. No aortic stenosis is present. Pulmonic Valve: The pulmonic valve was normal in structure. Pulmonic valve regurgitation is not visualized. No evidence of pulmonic stenosis. Aorta: The aortic root is normal in size and structure. Venous: The inferior vena cava is dilated in size with less than 50% respiratory variability, suggesting right atrial pressure of 15 mmHg. IAS/Shunts: No atrial level shunt detected by color flow Doppler.  LEFT VENTRICLE PLAX 2D LVIDd:         4.30 cm      Diastology LVIDs:         3.30 cm      LV e' medial:    9.25 cm/s LV PW:         1.10 cm      LV E/e' medial:  11.2 LV IVS:        1.10 cm      LV e' lateral:   10.80 cm/s LVOT diam:     2.10 cm      LV E/e' lateral: 9.6 LV SV:         50 LV SV Index:   23 LVOT Area:     3.46 cm  LV Volumes (MOD) LV vol d, MOD A2C: 116.0 ml LV vol d, MOD A4C: 103.0 ml LV vol s, MOD A2C: 62.2 ml LV vol s, MOD A4C: 57.9 ml LV SV MOD A2C:     53.8 ml LV SV MOD A4C:     103.0 ml LV SV MOD BP:      55.7 ml RIGHT VENTRICLE             IVC RV Basal diam:  4.80 cm     IVC diam: 2.60 cm RV S prime:     12.10 cm/s TAPSE (M-mode): 1.9 cm LEFT ATRIUM             Index        RIGHT ATRIUM           Index LA diam:        4.40 cm 2.02 cm/m   RA Area:     28.90 cm LA Vol (A2C):   55.8 ml 25.57 ml/m  RA Volume:   102.00 ml 46.74 ml/m LA Vol (A4C):   50.0 ml 22.91 ml/m LA Biplane Vol: 53.9 ml 24.70 ml/m  AORTIC VALVE LVOT  Vmax:   79.40 cm/s LVOT Vmean:  54.400 cm/s LVOT VTI:    0.143 m  AORTA Ao Root diam: 3.20 cm Ao Asc diam:  2.90 cm MITRAL VALVE                TRICUSPID VALVE MV Area (PHT): 3.93 cm     TR Peak grad:   16.8 mmHg MV Decel Time: 193 msec     TR Vmax:        205.00 cm/s MV E velocity: 104.00 cm/s                             SHUNTS                             Systemic VTI:  0.14 m  Systemic Diam: 2.10 cm Skeet Latch MD Electronically signed by Skeet Latch MD Signature Date/Time: 11/05/2022/12:27:50 PM    Final    DG Chest 1 View  Result Date: 11/04/2022 CLINICAL DATA:  Congestive failure EXAM: PORTABLE CHEST 1 VIEW COMPARISON:  11/04/2022 FINDINGS: Cardiac shadow is mildly prominent. Mild central vascular congestion is seen without edema. No focal infiltrate is noted. No bony abnormality is seen. IMPRESSION: Mild congestive failure. Electronically Signed   By: Inez Catalina M.D.   On: 11/04/2022 20:05   DG Chest Portable 1 View  Result Date: 11/04/2022 CLINICAL DATA:  Shortness of breath EXAM: PORTABLE CHEST 1 VIEW COMPARISON:  None Available. FINDINGS: No pleural effusion. No pneumothorax. No focal airspace opacity. Unchanged cardiac and mediastinal contours. Visualized upper abdomen is unremarkable. No displaced rib fractures. IMPRESSION: No focal airspace opacity. Electronically Signed   By: Marin Roberts M.D.   On: 11/04/2022 14:59    Pending Labs Unresulted Labs (From admission, onward)     Start     Ordered   11/05/22 0500  Heparin level (unfractionated)  Daily,   R      11/04/22 1638   11/05/22 0500  CBC  Daily,   R      11/04/22 1638            Vitals/Pain Today's Vitals   11/05/22 1250 11/05/22 1300 11/05/22 1345 11/05/22 1414  BP: 121/89 (!) 117/106  118/75  Pulse:  (!) 135 (!) 129 (!) 118  Resp:  (!) '24 16 15  '$ Temp:    97.8 F (36.6 C)  TempSrc:      SpO2:  100% 97% 96%  Weight:      Height:      PainSc:    0-No pain    Isolation  Precautions No active isolations  Medications Medications  acetaminophen (TYLENOL) tablet 650 mg (has no administration in time range)  aspirin EC tablet 81 mg (81 mg Oral Given 11/05/22 1023)  rosuvastatin (CRESTOR) tablet 40 mg (40 mg Oral Given 11/05/22 1023)  LORazepam (ATIVAN) tablet 0.5 mg (0.5 mg Oral Given 11/04/22 2207)  sertraline (ZOLOFT) tablet 100 mg (100 mg Oral Given 11/05/22 1023)  lactulose (CHRONULAC) 10 GM/15ML solution 10 g (has no administration in time range)  methocarbamol (ROBAXIN) tablet 500 mg (has no administration in time range)  pantoprazole (PROTONIX) EC tablet 40 mg (40 mg Oral Given 11/05/22 1023)  heparin ADULT infusion 100 units/mL (25000 units/25m) (1,350 Units/hr Intravenous New Bag/Given 11/05/22 0850)  metoprolol tartrate (LOPRESSOR) injection 2.5 mg (2.5 mg Intravenous Given 11/05/22 0822)  digoxin (LANOXIN) 0.25 MG/ML injection 0.125 mg (0.125 mg Intravenous Given 11/05/22 0840)  metoprolol tartrate (LOPRESSOR) tablet 25 mg (25 mg Oral Given 11/05/22 0839)  diltiazem (CARDIZEM) tablet 30 mg (30 mg Oral Given 11/05/22 1250)  diltiazem (CARDIZEM) 1 mg/mL load via infusion 10 mg (10 mg Intravenous Bolus from Bag 11/04/22 1441)  heparin bolus via infusion 5,000 Units (5,000 Units Intravenous Bolus from Bag 11/04/22 1731)  sodium chloride 0.9 % bolus 500 mL (0 mLs Intravenous Stopped 11/04/22 1943)  sodium chloride 0.9 % bolus 1,000 mL (0 mLs Intravenous Stopped 11/04/22 2327)  metoprolol tartrate (LOPRESSOR) injection 2.5 mg (2.5 mg Intravenous Given 11/05/22 0852)    Mobility walks     Focused Assessments Cardiac Assessment Handoff:  Cardiac Rhythm: Atrial fibrillation No results found for: "CKTOTAL", "CKMB", "CKMBINDEX", "TROPONINI" No results found for: "DDIMER" Does the Patient currently have chest pain? No    R Recommendations: See Admitting  Provider Note  Report given to:   Additional Notes: A&O x 4. Beta blockers drop his pressue. Just began  cardiazem PO. Uses bed side commode. No complaints of chest pain or shortness of breath.

## 2022-11-05 NOTE — ED Notes (Signed)
Patient stated he needed to use the bathroom and was having diarrhea. Patient was assisted to the bedside commode. Patient was encouraged to use the call bell when finished. Advised patient not to get up without help. Walked into room as the patient had not used the call bell. Patient was walking around the room, finishing his hygiene. Assisted the patient back into bed. Patient appeared short of breath. Patient is now resting in bed.

## 2022-11-05 NOTE — Progress Notes (Signed)
[place] for heparin Indication: atrial fibrillation Brief A/P: Heparin level within goal range Continue Heparin at current rate   Allergies  Allergen Reactions   Asa [Aspirin] Diarrhea, Nausea Only and Other (See Comments)    History of ulcers, also   Atorvastatin Other (See Comments)    Muscle aches   Celebrex [Celecoxib] Other (See Comments)    Headaches    Codeine Nausea And Vomiting   Savella [Milnacipran Hcl] Other (See Comments)    Reaction not recalled   Viagra [Sildenafil Citrate] Other (See Comments)    Headaches   Penicillins Rash     Vital Signs: Temp: 98 F (36.7 C) (01/30 2129) Temp Source: Oral (01/30 2129) BP: 112/78 (01/31 0015) Pulse Rate: 118 (01/31 0020)  Labs: Recent Labs    11/04/22 0000 11/04/22 1348 11/04/22 1734 11/05/22 0001  HGB 13.9 13.8  --   --   HCT 40.6 40.7  --   --   PLT 121* 116*  --   --   LABPROT  --   --  17.4*  --   INR  --   --  1.4*  --   HEPARINUNFRC  --   --   --  0.59  CREATININE  --  1.50*  --   --   TROPONINIHS  --  8 10  --      Estimated Creatinine Clearance: 48.8 mL/min (A) (by C-G formula based on SCr of 1.5 mg/dL (H)).   Assessment: 77 y.o. male with Afib for heparin  Goal of Therapy:  Heparin level 0.3-0.7 units/ml Monitor platelets by anticoagulation protocol: Yes   Plan:  Continue Heparin at current rate  Follow-up am labs.  Phillis Knack, PharmD, BCPS  11/05/2022 12:36 AM

## 2022-11-05 NOTE — ED Notes (Signed)
Cardiology paged

## 2022-11-05 NOTE — Progress Notes (Signed)
ANTICOAGULATION CONSULT NOTE  Pharmacy Consult for heparin Indication: atrial fibrillation  Allergies  Allergen Reactions   Asa [Aspirin] Diarrhea, Nausea Only and Other (See Comments)    History of ulcers, also   Atorvastatin Other (See Comments)    Muscle aches   Celebrex [Celecoxib] Other (See Comments)    Headaches    Codeine Nausea And Vomiting   Savella [Milnacipran Hcl] Other (See Comments)    Reaction not recalled   Viagra [Sildenafil Citrate] Other (See Comments)    Headaches   Penicillins Rash     Vital Signs: Temp: 99.2 F (37.3 C) (01/31 0420) Temp Source: Oral (01/31 0420) BP: 118/75 (01/31 0600) Pulse Rate: 126 (01/31 0600)  Labs: Recent Labs    11/04/22 0000 11/04/22 1348 11/04/22 1734 11/05/22 0001 11/05/22 0418  HGB 13.9 13.8  --   --  13.5  HCT 40.6 40.7  --   --  40.0  PLT 121* 116*  --   --  131*  LABPROT  --   --  17.4*  --  18.3*  INR  --   --  1.4*  --  1.5*  HEPARINUNFRC  --   --   --  0.59 0.35  CREATININE 1.54* 1.50*  --   --  1.85*  TROPONINIHS  --  8 10  --   --      Estimated Creatinine Clearance: 39.6 mL/min (A) (by C-G formula based on SCr of 1.85 mg/dL (H)).   Assessment: Patient presenting to the ED from doctors office with new afib with RVR. No AC PTA. Pharmacy consulted to dose heparin. Hx chronic thrombocytopenia.  Heparin level 0.35 units/mL (therapeutic) on heparin 1350 units/hr. CBC stable. Plts stable at 131 - no signs of bleeding  Goal of Therapy:  Heparin level 0.3-0.7 units/ml Monitor platelets by anticoagulation protocol: Yes   Plan:  Continue heparin 1350 units/hr Daily CBC and heparin level F/u cardiology plans for DOAC  Erskine Speed, PharmD Clinical Pharmacist 11/05/2022 7:13 AM

## 2022-11-05 NOTE — Progress Notes (Signed)
[facility] PROGRESS NOTE  Cody Hines. (DOB: [birth year]) KCL:[id number] PCP: [name], MD  Brief Narrative:  Cody Hines. is a 77 y.o. male with medical history significant of liver cirrhosis, HTN, HLD, CKD stage II, OSA, sent from PCPs office for evaluation of new side effect.   Patient complains about been feeling tired, malaise and episode of chest pain for about 1 week.  Chest pain has been random and not associated with activity, pressure-like 2-3/10 associated with shortness of breath, usually resolved by self in couple minutes.  No chest pain at night.  Today he went to see his PCP on regular 40-monthcheckup was found in rapid A-fib and sent to ED.  He also reported has a remote history of GI bleed when he was in his 361s for which he underwent EGD but no history of transfusion.  Last year he was diagnosed with cirrhosis with no clear etiology and underwent EGD again which showed portal hypertensive gastritis but no clear evidence of varices.   ED Course: Heart rate in the 140-170s and received IV bolus of Cardizem and started on Cardizem drip.  Chest x-ray lungs are clear.  Troponin negative x 2.  Subjective: Feeling chest discomfort with HR in 140's this morning. Had 2 loose stools overnight that were urgent, [facility] No dyspnea. No leg swelling.  Objective: BP 121/89   Pulse (!) 119   Temp 98.2 F (36.8 C) (Oral)   Resp 17   Ht '6\' 1"'$  (1.854 m)   Wt 93 kg   SpO2 93%   BMI 27.05 kg/m   Gen: Elderly male in no acute distress Pulm: Clear, nonlabored  CV: Irreg tachycardia, no MRG GI: Soft, NT, ND, +BS Neuro: Alert and oriented. No new focal deficits. Ext: Warm, no deformities Skin: No rashes, lesions or ulcers on visualized skin   Assessment & Plan: AFib with RVR: Rate elevated, new diagnosis. TSH 2.068 - Ordered to restart metoprolol this AM with improved BP. Also give IV  metoprolol prn. - Continue digoxin 0.'125mg'$  IV q6h. - Diltiazem ordered per cardiology. Echo pending at that time shows LVEF 45-50%, global hypokinesis, mild LVH, normal size/function of RV, nl PASP. Severe RAE. IVC is dilated and blunted. Will d/w cardiology. - Continue IV heparin as he may require DCCV if BP remains low, HR remains elevated.  - Keep K, Mg replete. Give K 20 now  Hepatic cirrhosis, portal HTN: Hx hepatic encephalopathy (none currently).  - Continue lactulose.  - Monitor thrombocytopenia. INR is 1.4-1.5. Albumin 3.6. No ascites.  - Continue PPI given anticoagulation  HTN:  - Hold BP meds  HLD: LDL is 60.   OSA:  - CPAP qHS  Depression:  - Continue SSRI  Stage IIIa CKD: SCr slightly up since arrival, suspect hemodynamically medicated hypoperfusion from rapid AFib. - Bladder scan given hx BPH - Avoid nephrotoxins.   IBS: Reports loose stools, though between IBS and lactulose, with benign abdominal exam, no leukocytosis, no further work up is currently planned.   RPatrecia Pour MD [facility] [email or web address] 11/05/2022, 12:44 PM

## 2022-11-05 NOTE — ED Notes (Signed)
Pt requesting water. Pt informed he is currently NPO with nothing to eat or drinking. Pt verbalized understanding and agreeable to plan.

## 2022-11-06 ENCOUNTER — Other Ambulatory Visit (HOSPITAL_COMMUNITY): Payer: Self-pay

## 2022-11-06 DIAGNOSIS — I480 Paroxysmal atrial fibrillation: Secondary | ICD-10-CM | POA: Diagnosis not present

## 2022-11-06 LAB — BASIC METABOLIC PANEL
Anion gap: 9 (ref 5–15)
BUN: 10 mg/dL (ref 8–23)
CO2: 24 mmol/L (ref 22–32)
Calcium: 8.8 mg/dL — ABNORMAL LOW (ref 8.9–10.3)
Chloride: 103 mmol/L (ref 98–111)
Creatinine, Ser: 1.9 mg/dL — ABNORMAL HIGH (ref 0.61–1.24)
GFR, Estimated: 37 mL/min — ABNORMAL LOW (ref >60)
Glucose, Bld: 114 mg/dL — ABNORMAL HIGH (ref 70–99)
Potassium: 4 mmol/L (ref 3.5–5.1)
Sodium: 136 mmol/L (ref 135–145)

## 2022-11-06 LAB — CBC
HCT: 39 % (ref 39.0–52.0)
Hemoglobin: 13.5 g/dL (ref 13.0–17.0)
MCH: 32.2 pg (ref 26.0–34.0)
MCHC: 34.6 g/dL (ref 30.0–36.0)
MCV: 93.1 fL (ref 80.0–100.0)
Platelets: 122 10*3/uL — ABNORMAL LOW (ref 150–400)
RBC: 4.19 MIL/uL — ABNORMAL LOW (ref 4.22–5.81)
RDW: 14.9 % (ref 11.5–15.5)
WBC: 6.9 10*3/uL (ref 4.0–10.5)
nRBC: 0 % (ref 0.0–0.2)

## 2022-11-06 LAB — HEPARIN LEVEL (UNFRACTIONATED): Heparin Unfractionated: 0.51 IU/mL (ref 0.30–0.70)

## 2022-11-06 LAB — MAGNESIUM: Magnesium: 2.1 mg/dL (ref 1.7–2.4)

## 2022-11-06 MED ORDER — METOPROLOL TARTRATE 50 MG PO TABS
75.0000 mg | ORAL_TABLET | Freq: Two times a day (BID) | ORAL | Status: DC
  Administered 2022-11-06: 75 mg via ORAL
  Filled 2022-11-06: qty 1

## 2022-11-06 MED ORDER — METOPROLOL TARTRATE 25 MG PO TABS
25.0000 mg | ORAL_TABLET | Freq: Two times a day (BID) | ORAL | Status: DC
  Administered 2022-11-06 – 2022-11-08 (×3): 25 mg via ORAL
  Filled 2022-11-06 (×3): qty 1

## 2022-11-06 MED ORDER — DILTIAZEM HCL 60 MG PO TABS
60.0000 mg | ORAL_TABLET | Freq: Four times a day (QID) | ORAL | Status: DC
  Administered 2022-11-06 – 2022-11-08 (×7): 60 mg via ORAL
  Filled 2022-11-06 (×7): qty 1

## 2022-11-06 NOTE — Progress Notes (Signed)
[facility] PROGRESS NOTE  Neville Philmore Khairi Garman. (DOB: May 20, 1946) TIW:[id number] PCP: [name], MD  Brief Narrative:  Cody Hines. is a 77 y.o. male with medical history significant of liver cirrhosis, HTN, HLD, CKD stage II, OSA, sent from PCPs office for evaluation of rapid atrial fibrillation. He complained of chest pain and was sent to the ED where he was admitted with diltiazem and heparin infusions. Troponins negative. Cardiology consulted, recommending TEE-DCCV which the patient is considering. Optimizing medical therapy for now.    Subjective: Having indigestion which is typical for him. No further mention of diarrhea. No abdominal or chest pain or dyspnea.   Objective: BP 101/65 (BP Location: Left Arm)   Pulse (!) 108   Temp 98.1 F (36.7 C) (Oral)   Resp 16   Ht '6\' 1"'$  (1.854 m)   Wt 93.3 kg   SpO2 96%   BMI 27.14 kg/m   Gen: No distress Pulm: Clear, nonlabored  CV: Irreg irreg, rate between 100-120's, no MRG or pitting edema GI: Soft, NT, ND, +BS Neuro: Alert and oriented. No new focal deficits. Ext: Warm, no deformities Skin: No rashes, lesions or ulcers on visualized skin   Assessment & Plan: AFib with RVR: New diagnosis. TSH 2.068 - Increased metoprolol as rate remained elevated, though cardiology lowered it, diltiazem increased to '60mg'$  po q6h. Received IV digoxin x4 1/31.  - Continue digoxin 0.'125mg'$  IV q6h.  Chronic HFrEF: Newly diagnosed, not acutely overloaded. LVEF 45-50%, global hypokinesis, mild LVH, normal size/function of RV, nl PASP. Severe RAE. IVC is dilated and blunted. Likely plethoric due to rapid AFib at time of exam.  - Continue IV heparin as he may require DCCV. D/w cardiology who can perform this if patient consents tomorrow. DC aspirin while on anticoagulation. - Keep K, Mg replete. At goal today.  Hepatic cirrhosis, portal HTN: Hx hepatic encephalopathy (none currently).  - Continue lactulose.  - Monitor  thrombocytopenia, stable. INR is 1.4-1.5. Albumin 3.6. No ascites.  - Continue PPI given initiation of anticoagulation  HTN:  - Hold BP meds  HLD: LDL is 60.  - Continue rosuvastatin  OSA:  - CPAP qHS  Depression:  - Continue SSRI  Stage IIIa CKD: SCr slightly up, decreasing rate of rise since arrival, suspect hemodynamically medicated hypoperfusion from rapid AFib. - Avoid nephrotoxins.  - Monitor in AM.  IBS: Reports loose stools, though between IBS and lactulose, with benign abdominal exam, no leukocytosis, no further work up is currently planned.   Patrecia Pour, MD [facility] [email or web address] 11/06/2022, 10:45 AM

## 2022-11-06 NOTE — Progress Notes (Signed)
Pt states he wears CPAP at home, but does not want to wear one here at hospital

## 2022-11-06 NOTE — Evaluation (Signed)
Physical Therapy Evaluation Patient Details Name: Cody Hines. MRN: [id number] DOB: 12/03/1945 Today's Date: 11/06/2022  History of Present Illness  77 y.o. male admitted 1/30 from PCPs office for evaluation of rapid atrial fibrillation. PMHx: liver cirrhosis, HTN, HLD, CKD stage II, OSA,  Clinical Impression  Pt admitted with above diagnosis. Mobilizing with some effort to get out of bed but performed today without physical assistance. Ambulates in hallway, reliant on RW for support. HR maintained around 105. BP actually increased from 81/59 supine to 103/68 sitting, and 100/72 standing. Denies dizziness. Returned to bed with BP 119/85. Lives alone but anticipate he will recover to mod I level prior to d/c. May need HHPT at d/c, will update as he progresses. Pt currently with functional limitations due to the deficits listed below (see PT Problem List). Pt will benefit from skilled PT to increase their independence and safety with mobility to allow discharge to the venue listed below.          Recommendations for follow up therapy are one component of a multi-disciplinary discharge planning process, led by the attending physician.  Recommendations may be updated based on patient status, additional functional criteria and insurance authorization.  Follow Up Recommendations Home health PT (May progress to OPPT - will update as appropriate)      Assistance Recommended at Discharge PRN  Patient can return home with the following  A little help with walking and/or transfers;Assistance with cooking/housework;A little help with bathing/dressing/bathroom;Assist for transportation;Help with stairs or ramp for entrance    Equipment Recommendations Rolling walker (2 wheels)  Recommendations for Other Services       Functional Status Assessment Patient has had a recent decline in their functional status and demonstrates the ability to make significant improvements in function in a  reasonable and predictable amount of time.     Precautions / Restrictions Precautions Precautions: None Precaution Comments: Monitor HR and BP Restrictions Weight Bearing Restrictions: No      Mobility  Bed Mobility Overal bed mobility: Needs Assistance Bed Mobility: Supine to Sit, Sit to Supine     Supine to sit: Min guard Sit to supine: Supervision   General bed mobility comments: Min guard to rise to EOB, requires extra time, guarded due to LOB. No assist returning to bed.    Transfers Overall transfer level: Needs assistance Equipment used: Rolling walker (2 wheels) Transfers: Sit to/from Stand Sit to Stand: Min guard           General transfer comment: Close guard for safety. Very slow and effortful to rise. Cues for technique. RW provided for support upon standing.    Ambulation/Gait Ambulation/Gait assistance: Supervision Gait Distance (Feet): 110 Feet Assistive device: Rolling walker (2 wheels) Gait Pattern/deviations: Step-through pattern, Decreased stride length, Trunk flexed Gait velocity: decr Gait velocity interpretation: <1.8 ft/sec, indicate of risk for recurrent falls   General Gait Details: Educated on safe AD use with RW, cues for upright posture. VS monitored during bout. No evidence of overt LOB or buckling however fairly reliant on RW for support today. Supervision for safety. HR 105  Stairs            Wheelchair Mobility    Modified Rankin (Stroke Patients Only)       Balance Overall balance assessment: Mild deficits observed, not formally tested  Pertinent Vitals/Pain Pain Assessment Pain Assessment: Faces Faces Pain Scale: Hurts little more Pain Location: back - chronic Pain Descriptors / Indicators: Aching, Constant Pain Intervention(s): Monitored during session, Repositioned    Home Living Family/patient expects to be discharged to:: Private residence Living  Arrangements: Alone   Type of Home: House Home Access: Ramped entrance     Alternate Level Stairs-Number of Steps: 7 (split level) Home Layout: Multi-level Home Equipment: Cane - single point;BSC/3in1 Additional Comments: lives alone, limited assistance    Prior Function Prior Level of Function : Independent/Modified Independent;Driving             Mobility Comments: active, drives, no AD needed PTA       Hand Dominance   Dominant Hand: Right    Extremity/Trunk Assessment   Upper Extremity Assessment Upper Extremity Assessment: Defer to OT evaluation    Lower Extremity Assessment Lower Extremity Assessment: Generalized weakness       Communication   Communication: No difficulties  Cognition Arousal/Alertness: Awake/alert Behavior During Therapy: WFL for tasks assessed/performed Overall Cognitive Status: Within Functional Limits for tasks assessed                                          General Comments General comments (skin integrity, edema, etc.): Orthostatics taken - see vitals tab at 1513    Exercises     Assessment/Plan    PT Assessment Patient needs continued PT services  PT Problem List Decreased strength;Decreased range of motion;Decreased activity tolerance;Decreased balance;Decreased mobility;Decreased knowledge of use of DME;Cardiopulmonary status limiting activity;Pain       PT Treatment Interventions DME instruction;Gait training;Stair training;Functional mobility training;Therapeutic activities;Therapeutic exercise;Balance training;Neuromuscular re-education;Patient/family education    PT Goals (Current goals can be found in the Care Plan section)  Acute Rehab PT Goals Patient Stated Goal: Get well PT Goal Formulation: With patient Time For Goal Achievement: 11/20/22 Potential to Achieve Goals: Good    Frequency Min 3X/week     Co-evaluation               AM-PAC PT "6 Clicks" Mobility  Outcome Measure  Help needed turning from your back to your side while in a flat bed without using bedrails?: None Help needed moving from lying on your back to sitting on the side of a flat bed without using bedrails?: None Help needed moving to and from a bed to a chair (including a wheelchair)?: A Little Help needed standing up from a chair using your arms (e.g., wheelchair or bedside chair)?: A Little Help needed to walk in hospital room?: A Little Help needed climbing 3-5 steps with a railing? : A Little 6 Click Score: 20    End of Session Equipment Utilized During Treatment: Gait belt Activity Tolerance: Patient tolerated treatment well Patient left: in bed;with call bell/phone within reach;with bed alarm set (Encouraged getting OOB frequently with staff. Sit up in chair for dinner. Declines recliner at this time.) Nurse Communication: Mobility status PT Visit Diagnosis: Unsteadiness on feet (R26.81);Other abnormalities of gait and mobility (R26.89);Muscle weakness (generalized) (M62.81)    Time: 9562-1308 PT Time Calculation (min) (ACUTE ONLY): 37 min   Charges:   PT Evaluation $PT Eval Low Complexity: 1 Low PT Treatments $Gait Training: 8-22 mins        Candie Mile, PT, DPT Physical Therapist Acute Rehabilitation Services [facility]  [place] 11/06/2022, 3:34 PM

## 2022-11-06 NOTE — Progress Notes (Signed)
ANTICOAGULATION CONSULT NOTE  Pharmacy Consult for heparin Indication: atrial fibrillation  Allergies  Allergen Reactions   Asa [Aspirin] Diarrhea, Nausea Only and Other (See Comments)    History of ulcers, also   Atorvastatin Other (See Comments)    Muscle aches   Celebrex [Celecoxib] Other (See Comments)    Headaches    Codeine Nausea And Vomiting   Savella [Milnacipran Hcl] Other (See Comments)    Reaction not recalled   Viagra [Sildenafil Citrate] Other (See Comments)    Headaches   Penicillins Rash     Vital Signs: Temp: 98 F (36.7 C) (02/01 0414) Temp Source: Oral (02/01 0414) BP: 128/78 (02/01 0742) Pulse Rate: 110 (02/01 0742)  Labs: Recent Labs    11/04/22 1348 11/04/22 1734 11/05/22 0001 11/05/22 0418 11/06/22 0051  HGB 13.8  --   --  13.5 13.5  HCT 40.7  --   --  40.0 39.0  PLT 116*  --   --  131* 122*  LABPROT  --  17.4*  --  18.3*  --   INR  --  1.4*  --  1.5*  --   HEPARINUNFRC  --   --  0.59 0.35 0.51  CREATININE 1.50*  --   --  1.85* 1.90*  TROPONINIHS 8 10  --   --   --     Estimated Creatinine Clearance: 38.5 mL/min (A) (by C-G formula based on SCr of 1.9 mg/dL (H)).   Assessment: Patient presenting to the ED from doctors office with new afib with RVR. No AC PTA. Pharmacy consulted to dose heparin. Hx chronic thrombocytopenia.  Heparin level 0.51 is therapeutic on 1350 units/hr. CBC stable   Goal of Therapy:  Heparin level 0.3-0.7 units/ml Monitor platelets by anticoagulation protocol: Yes   Plan:  Continue heparin 1350 units/hr Daily CBC and heparin level F/u cardiology plans for [place], PharmD, BCPS, [place] Pharmacist  Please check AMION for all [place] phone numbers After 10:00 PM, call [place] [phone]

## 2022-11-06 NOTE — TOC Benefit Eligibility Note (Signed)
Patient Teacher, English as a foreign language completed.    The patient is currently admitted and upon discharge could be taking Eliquis 5 mg.  The current 30 day co-pay is $172.93 Can use copay card.   The patient is insured through [place], [place] Patient [place] Patient Advocate Team Direct Number: [phone]  Fax: [phone]

## 2022-11-06 NOTE — Consult Note (Signed)
Ref: [name], MD/M. Harwani, MD   Subjective:  Atrial fibrillation with controlled ventricular response. TEE with external cardioversion scheduled for tomorrow. Patient understood procedure and risks and wants to proceed.  Transthoracic echocardiogram yesterday showed severe RV dilatation and normal LA size with mild LV systolic dysfunction.  Objective:  Vital Signs in the last 24 hours: Temp:  [97.8 F (36.6 C)-98.1 F (36.7 C)] 97.9 F (36.6 C) (02/01 1131) Pulse Rate:  [81-132] 96 (02/01 1532) Cardiac Rhythm: Atrial fibrillation (02/01 0700) Resp:  [16-18] 16 (02/01 1131) BP: (95-128)/(57-88) 96/62 (02/01 1131) SpO2:  [93 %-97 %] 93 % (02/01 1131) Weight:  [93.3 kg] 93.3 kg (02/01 0414)  Physical Exam: BP Readings from Last 1 Encounters:  11/06/22 96/62     Wt Readings from Last 1 Encounters:  11/06/22 93.3 kg    Weight change:  Body mass index is 27.14 kg/m. HEENT: [place]/AT, Eyes-Blue, Conjunctiva-Pink, Sclera-Non-icteric Neck: No JVD, No bruit, Trachea midline. Lungs:  Clear, Bilateral. Cardiac:  Irregular rhythm, normal S1 and S2, no S3. II/VI systolic murmur. Abdomen:  Soft, non-tender. BS present. Extremities:  No edema present. No cyanosis. No clubbing. CNS: AxOx3, Cranial nerves grossly intact, moves all 4 extremities.  Skin: Warm and dry.   Intake/Output from previous day: 01/31 0701 - 02/01 0700 In: 854 [P.O.:480; I.V.:374] Out: 600 [Urine:600]    Lab Results: BMET    Component Value Date/Time   NA 136 11/06/2022 0051   NA 139 11/05/2022 0418   NA 139 11/04/2022 1348   K 4.0 11/06/2022 0051   K 3.8 11/05/2022 0418   K 4.0 11/04/2022 1348   CL 103 11/06/2022 0051   CL 106 11/05/2022 0418   CL 109 11/04/2022 1348   CO2 24 11/06/2022 0051   CO2 22 11/05/2022 0418   CO2 22 11/04/2022 1348   GLUCOSE 114 (H) 11/06/2022 0051   GLUCOSE 106 (H) 11/05/2022 0418   GLUCOSE 79 11/04/2022 1348   BUN 10 11/06/2022 0051   BUN 11 11/05/2022 0418    BUN 7 (L) 11/04/2022 1348   CREATININE 1.90 (H) 11/06/2022 0051   CREATININE 1.85 (H) 11/05/2022 0418   CREATININE 1.50 (H) 11/04/2022 1348   CREATININE 1.54 (H) 11/04/2022 0000   CREATININE 1.49 (H) 07/24/2022 0000   CREATININE 1.49 (H) 01/09/2022 1108   CALCIUM 8.8 (L) 11/06/2022 0051   CALCIUM 8.9 11/05/2022 0418   CALCIUM 8.9 11/04/2022 1348   GFRNONAA 37 (L) 11/06/2022 0051   GFRNONAA 38 (L) 11/05/2022 0418   GFRNONAA 49 (L) 11/04/2022 1348   GFRNONAA 49 (L) 01/09/2021 0959   GFRNONAA 48 (L) 07/12/2020 1101   GFRNONAA 50 (L) 04/05/2020 1015   GFRAA 57 (L) 01/09/2021 0959   GFRAA 55 (L) 07/12/2020 1101   GFRAA 58 (L) 04/05/2020 1015   CBC    Component Value Date/Time   WBC 6.9 11/06/2022 0051   RBC 4.19 (L) 11/06/2022 0051   HGB 13.5 11/06/2022 0051   HCT 39.0 11/06/2022 0051   PLT 122 (L) 11/06/2022 0051   MCV 93.1 11/06/2022 0051   MCH 32.2 11/06/2022 0051   MCHC 34.6 11/06/2022 0051   RDW 14.9 11/06/2022 0051   LYMPHSABS 1,880 11/04/2022 0000   MONOABS 0.5 03/17/2022 1140   EOSABS 150 11/04/2022 0000   BASOSABS 20 11/04/2022 0000   HEPATIC Function Panel Recent Labs    03/17/22 1140 07/24/22 0000 11/04/22 0000  PROT 6.5 6.4 6.4  ALBUMIN 3.7  --   --   AST 25  47* 42*  ALT '12 24 22  '$ ALKPHOS 81  --   --    HEMOGLOBIN A1C Lab Results  Component Value Date   MPG 111 11/04/2022   CARDIAC ENZYMES No results found for: "CKTOTAL", "CKMB", "CKMBINDEX", "TROPONINI" BNP No results for input(s): "PROBNP" in the last 8760 hours. TSH Recent Labs    01/09/22 1108 07/24/22 0000 11/04/22 1734  TSH 1.81 2.20 2.068   CHOLESTEROL Recent Labs    01/09/22 1108 07/24/22 0000 11/04/22 0000  CHOL 137 124 125    Scheduled Meds:  diltiazem  60 mg Oral QID   LORazepam  0.5 mg Oral QHS   metoprolol tartrate  25 mg Oral BID   pantoprazole  40 mg Oral Daily   rosuvastatin  40 mg Oral Daily   sertraline  100 mg Oral Daily   Continuous Infusions:  heparin  1,350 Units/hr (11/05/22 1806)   PRN Meds:.acetaminophen, lactulose, methocarbamol, metoprolol tartrate  Assessment/Plan: Atrial fibrillation with controlled ventricular response HTN HLD OSA IBS BPH H/O liver cirrhosis Fibromyalgia CKD, IIIb  Plan: TEE with possible external cardioversion tomorrow. NPO after mid-night.   LOS: 2 days   Time spent including chart review, lab review, examination, discussion with patient/Doctor/Nurse : 30 min   Dixie Dials  MD  11/06/2022, 4:54 PM

## 2022-11-06 NOTE — Progress Notes (Signed)
Subjective:  Patient denies any chest pain or shortness of breath remains in A-fib with moderate to rapid ventricular response heart rate  better improved after starting Cardizem  Objective:  Vital Signs in the last 24 hours: Temp:  [97.7 F (36.5 C)-98.1 F (36.7 C)] 98.1 F (36.7 C) (02/01 0927) Pulse Rate:  [81-155] 108 (02/01 0927) Resp:  [15-24] 16 (02/01 0927) BP: (95-128)/(57-106) 101/65 (02/01 0927) SpO2:  [93 %-100 %] 96 % (02/01 0927) Weight:  [93.1 kg-93.3 kg] 93.3 kg (02/01 0414)  Intake/Output from previous day: 01/31 0701 - 02/01 0700 In: 854 [P.O.:480; I.V.:374] Out: 600 [Urine:600] Intake/Output from this shift: Total I/O In: 50 [P.O.:50] Out: 0   Physical Exam: Neck: no adenopathy, no carotid bruit, no JVD, and supple, symmetrical, trachea midline Lungs: clear to auscultation bilaterally Heart: irregularly irregular rhythm, S1, S2 normal, and soft systolic murmur noted Abdomen: soft, non-tender; bowel sounds normal; no masses,  no organomegaly Extremities: extremities normal, atraumatic, no cyanosis or edema  Lab Results: Recent Labs    11/05/22 0418 11/06/22 0051  WBC 6.9 6.9  HGB 13.5 13.5  PLT 131* 122*   Recent Labs    11/05/22 0418 11/06/22 0051  NA 139 136  K 3.8 4.0  CL 106 103  CO2 22 24  GLUCOSE 106* 114*  BUN 11 10  CREATININE 1.85* 1.90*   No results for input(s): "TROPONINI" in the last 72 hours.  Invalid input(s): "CK", "MB" Hepatic Function Panel Recent Labs    11/04/22 0000  PROT 6.4  AST 42*  ALT 22  BILITOT 1.7*   Recent Labs    11/04/22 0000  CHOL 125   No results for input(s): "PROTIME" in the last 72 hours.  Imaging: Imaging results have been reviewed and ECHOCARDIOGRAM COMPLETE  Result Date: 11/05/2022    ECHOCARDIOGRAM REPORT   Patient Name:   Cody Hines. Date of Exam: 11/05/2022 Medical Rec #:  [id number]                   Height:       73.0 in Accession #:    [id number]                   Weight:       206.8 lb Date of Birth:  25-Oct-1945                  BSA:          2.182 m Patient Age:    77 years                    BP:           103/88 mmHg Patient Gender: M                           HR:           112 bpm. Exam Location:  Inpatient Procedure: 2D Echo, Cardiac Doppler and Color Doppler Indications:    R94.31 Abnormal EKG  History:        Patient has no prior history of Echocardiogram examinations.                 Risk Factors:Hypertension and Dyslipidemia.  Sonographer:    Phineas Douglas Referring Phys: [id number]  1. Left ventricular ejection fraction, by estimation, is 45 to 50%. The left ventricle has mildly decreased function. The left  ventricle demonstrates global hypokinesis. There is mild concentric left ventricular hypertrophy. Left ventricular diastolic parameters are indeterminate.  2. Right ventricular systolic function is normal. The right ventricular size is normal. There is normal pulmonary artery systolic pressure.  3. Right atrial size was severely dilated.  4. There is no evidence of cardiac tamponade.  5. The mitral valve is normal in structure. Trivial mitral valve regurgitation. No evidence of mitral stenosis.  6. The aortic valve is tricuspid. Aortic valve regurgitation is not visualized. No aortic stenosis is present.  7. The inferior vena cava is dilated in size with <50% respiratory variability, suggesting right atrial pressure of 15 mmHg. FINDINGS  Left Ventricle: Left ventricular ejection fraction, by estimation, is 45 to 50%. The left ventricle has mildly decreased function. The left ventricle demonstrates global hypokinesis. The left ventricular internal cavity size was normal in size. There is  mild concentric left ventricular hypertrophy. Left ventricular diastolic parameters are indeterminate. Indeterminate filling pressures. Right Ventricle: The right ventricular size is normal. No increase in right ventricular wall thickness. Right ventricular  systolic function is normal. There is normal pulmonary artery systolic pressure. The tricuspid regurgitant velocity is 2.05 m/s, and  with an assumed right atrial pressure of 15 mmHg, the estimated right ventricular systolic pressure is 65.9 mmHg. Left Atrium: Left atrial size was normal in size. Right Atrium: Right atrial size was severely dilated. Pericardium: Trivial pericardial effusion is present. There is no evidence of cardiac tamponade. Mitral Valve: The mitral valve is normal in structure. Trivial mitral valve regurgitation. No evidence of mitral valve stenosis. Tricuspid Valve: The tricuspid valve is normal in structure. Tricuspid valve regurgitation is mild . No evidence of tricuspid stenosis. Aortic Valve: The aortic valve is tricuspid. Aortic valve regurgitation is not visualized. No aortic stenosis is present. Pulmonic Valve: The pulmonic valve was normal in structure. Pulmonic valve regurgitation is not visualized. No evidence of pulmonic stenosis. Aorta: The aortic root is normal in size and structure. Venous: The inferior vena cava is dilated in size with less than 50% respiratory variability, suggesting right atrial pressure of 15 mmHg. IAS/Shunts: No atrial level shunt detected by color flow Doppler.  LEFT VENTRICLE PLAX 2D LVIDd:         4.30 cm      Diastology LVIDs:         3.30 cm      LV e' medial:    9.25 cm/s LV PW:         1.10 cm      LV E/e' medial:  11.2 LV IVS:        1.10 cm      LV e' lateral:   10.80 cm/s LVOT diam:     2.10 cm      LV E/e' lateral: 9.6 LV SV:         50 LV SV Index:   23 LVOT Area:     3.46 cm  LV Volumes (MOD) LV vol d, MOD A2C: 116.0 ml LV vol d, MOD A4C: 103.0 ml LV vol s, MOD A2C: 62.2 ml LV vol s, MOD A4C: 57.9 ml LV SV MOD A2C:     53.8 ml LV SV MOD A4C:     103.0 ml LV SV MOD BP:      55.7 ml RIGHT VENTRICLE             IVC RV Basal diam:  4.80 cm     IVC diam: 2.60 cm RV S prime:  12.10 cm/s TAPSE (M-mode): 1.9 cm LEFT ATRIUM             Index         RIGHT ATRIUM           Index LA diam:        4.40 cm 2.02 cm/m   RA Area:     28.90 cm LA Vol (A2C):   55.8 ml 25.57 ml/m  RA Volume:   102.00 ml 46.74 ml/m LA Vol (A4C):   50.0 ml 22.91 ml/m LA Biplane Vol: 53.9 ml 24.70 ml/m  AORTIC VALVE LVOT Vmax:   79.40 cm/s LVOT Vmean:  54.400 cm/s LVOT VTI:    0.143 m  AORTA Ao Root diam: 3.20 cm Ao Asc diam:  2.90 cm MITRAL VALVE                TRICUSPID VALVE MV Area (PHT): 3.93 cm     TR Peak grad:   16.8 mmHg MV Decel Time: 193 msec     TR Vmax:        205.00 cm/s MV E velocity: 104.00 cm/s                             SHUNTS                             Systemic VTI:  0.14 m                             Systemic Diam: 2.10 cm Skeet Latch MD Electronically signed by Skeet Latch MD Signature Date/Time: 11/05/2022/12:27:50 PM    Final    DG Chest 1 View  Result Date: 11/04/2022 CLINICAL DATA:  Congestive failure EXAM: PORTABLE CHEST 1 VIEW COMPARISON:  11/04/2022 FINDINGS: Cardiac shadow is mildly prominent. Mild central vascular congestion is seen without edema. No focal infiltrate is noted. No bony abnormality is seen. IMPRESSION: Mild congestive failure. Electronically Signed   By: Inez Catalina M.D.   On: 11/04/2022 20:05   DG Chest Portable 1 View  Result Date: 11/04/2022 CLINICAL DATA:  Shortness of breath EXAM: PORTABLE CHEST 1 VIEW COMPARISON:  None Available. FINDINGS: No pleural effusion. No pneumothorax. No focal airspace opacity. Unchanged cardiac and mediastinal contours. Visualized upper abdomen is unremarkable. No displaced rib fractures. IMPRESSION: No focal airspace opacity. Electronically Signed   By: Marin Roberts M.D.   On: 11/04/2022 14:59    Cardiac Studies:  Assessment/Plan:  New onset A-fib with RVR CHA2DS2-VASc score of 2 Status post noncardiac chest pain Hypertension Hyperlipidemia Obstructive sleep apnea Irritable bowel syndrome Benign hypertrophy of prostate History of cirrhosis of the  liver Fibromyalgia Plan Increase Cardizem to 60 mg p.o. every 6 hours Reduce metoprolol to tartrate to 25 mg twice daily Awaiting patient's decision regarding TEE cardioversion versus medical management he will discuss with his son today  LOS: 2 days    Charolette Forward 11/06/2022, 10:38 AM

## 2022-11-06 NOTE — TOC Initial Note (Signed)
Transition of Care (TOC) - Initial/Assessment Note    Patient Details  Name: Cody Hines Cody Hines. MRN: [id number] Date of Birth: 07-08-1946  Transition [facility]) CM/SW Contact:    Ninfa Meeker, RN Phone Number: 11/06/2022, 11:28 AM  Clinical Narrative:                 Transition of Care Screening Note: Patient is a 77 yr old male, admitted from PCP office for Rapid afib.   Transition of Care [facility]) Department has reviewed patient and no TOC needs have been identified at this time. We will continue to monitor patient advancement through Interdisciplinary progressions and if new patient needs arise, please place a consult.        Patient Goals and CMS Choice            Expected Discharge Plan and Services                                              Prior Living Arrangements/Services                       Activities of Daily Living Home Assistive Devices/Equipment: None, Other (Comment) (cane) ADL Screening (condition at time of admission) Patient's cognitive ability adequate to safely complete daily activities?: Yes Is the patient deaf or have difficulty hearing?: No Does the patient have difficulty seeing, even when wearing glasses/contacts?: No Does the patient have difficulty concentrating, remembering, or making decisions?: No Patient able to express need for assistance with ADLs?: No Does the patient have difficulty dressing or bathing?: No Independently performs ADLs?: Yes (appropriate for developmental age) Does the patient have difficulty walking or climbing stairs?: No Weakness of Legs: None Weakness of Arms/Hands: None  Permission Sought/Granted                  Emotional Assessment              Admission diagnosis:  A-fib ([place]) [I48.91] Atrial fibrillation, unspecified type ([place]) [I48.91] Patient Active Problem List   Diagnosis Date Noted   A-fib ([place]) 11/04/2022   Cirrhosis ([place]) 11/04/2022    Malnutrition of moderate degree 06/26/2021   Closed L2 vertebral fracture ([place]) 06/22/2021   Closed L3 vertebral fracture ([place]) 06/22/2021   Contact with powered lawnmower as cause of accidental injury at home as place of occurrence 06/22/2021   CKD (chronic kidney disease) stage 3, GFR 30-59 ml/min ([place]) 06/22/2021   OSA on CPAP 06/22/2021   MCI (mild cognitive impairment) 06/22/2021   Pulmonary nodule 06/22/2021   Small right kidney 04/05/2020   Aortic atherosclerosis ([place]) by CT Scan on 06/22/2019 04/04/2020   At moderate risk for fall 09/27/2019   Compression fracture of T12 vertebra ([place]) 09/27/2019   Hyperlipidemia, mixed 09/26/2019   Personal history of skin cancer 09/26/2019   Major depressive disorder, recurrent episode, moderate ([place]) 07/19/2018   Chronic lumbar pain 03/12/2018   Benign essential tremor 12/19/2016   Medication management 05/03/2014   Vitamin D deficiency 05/03/2014   Essential hypertension    GERD (gastroesophageal reflux disease)    IBS (irritable bowel syndrome)    Fibromyalgia    BPH (benign prostatic hyperplasia)    Testosterone deficiency    PCP:  [name], MD Pharmacy:   [facility] - [place], [address] [place] [address] Phone: [phone] Fax: [place], [place] [address] [place] [address] Phone: [phone] Fax: [phone]  [place], [place] [address] [place] [address] Phone: [phone] Fax: [phone]     Social Determinants of Health (SDOH) Social History: [place]: No Food Insecurity (11/05/2022)  Housing: Low Risk  (11/05/2022)  Transportation Needs: No Transportation Needs (11/05/2022)  Utilities: Not At Risk (11/05/2022)  Depression  (PHQ2-9): Low Risk  (11/04/2022)  Tobacco Use: Low Risk  (11/04/2022)   SDOH Interventions:     Readmission Risk Interventions     No data to display

## 2022-11-06 NOTE — H&P (View-Only) (Signed)
Ref: [name], MD/M. Harwani, MD   Subjective:  Atrial fibrillation with controlled ventricular response. TEE with external cardioversion scheduled for tomorrow. Patient understood procedure and risks and wants to proceed.  Transthoracic echocardiogram yesterday showed severe RV dilatation and normal LA size with mild LV systolic dysfunction.  Objective:  Vital Signs in the last 24 hours: Temp:  [97.8 F (36.6 C)-98.1 F (36.7 C)] 97.9 F (36.6 C) (02/01 1131) Pulse Rate:  [81-132] 96 (02/01 1532) Cardiac Rhythm: Atrial fibrillation (02/01 0700) Resp:  [16-18] 16 (02/01 1131) BP: (95-128)/(57-88) 96/62 (02/01 1131) SpO2:  [93 %-97 %] 93 % (02/01 1131) Weight:  [93.3 kg] 93.3 kg (02/01 0414)  Physical Exam: BP Readings from Last 1 Encounters:  11/06/22 96/62     Wt Readings from Last 1 Encounters:  11/06/22 93.3 kg    Weight change:  Body mass index is 27.14 kg/m. HEENT: [place]/AT, Eyes-Blue, Conjunctiva-Pink, Sclera-Non-icteric Neck: No JVD, No bruit, Trachea midline. Lungs:  Clear, Bilateral. Cardiac:  Irregular rhythm, normal S1 and S2, no S3. II/VI systolic murmur. Abdomen:  Soft, non-tender. BS present. Extremities:  No edema present. No cyanosis. No clubbing. CNS: AxOx3, Cranial nerves grossly intact, moves all 4 extremities.  Skin: Warm and dry.   Intake/Output from previous day: 01/31 0701 - 02/01 0700 In: 854 [P.O.:480; I.V.:374] Out: 600 [Urine:600]    Lab Results: BMET    Component Value Date/Time   NA 136 11/06/2022 0051   NA 139 11/05/2022 0418   NA 139 11/04/2022 1348   K 4.0 11/06/2022 0051   K 3.8 11/05/2022 0418   K 4.0 11/04/2022 1348   CL 103 11/06/2022 0051   CL 106 11/05/2022 0418   CL 109 11/04/2022 1348   CO2 24 11/06/2022 0051   CO2 22 11/05/2022 0418   CO2 22 11/04/2022 1348   GLUCOSE 114 (H) 11/06/2022 0051   GLUCOSE 106 (H) 11/05/2022 0418   GLUCOSE 79 11/04/2022 1348   BUN 10 11/06/2022 0051   BUN 11 11/05/2022 0418    BUN 7 (L) 11/04/2022 1348   CREATININE 1.90 (H) 11/06/2022 0051   CREATININE 1.85 (H) 11/05/2022 0418   CREATININE 1.50 (H) 11/04/2022 1348   CREATININE 1.54 (H) 11/04/2022 0000   CREATININE 1.49 (H) 07/24/2022 0000   CREATININE 1.49 (H) 01/09/2022 1108   CALCIUM 8.8 (L) 11/06/2022 0051   CALCIUM 8.9 11/05/2022 0418   CALCIUM 8.9 11/04/2022 1348   GFRNONAA 37 (L) 11/06/2022 0051   GFRNONAA 38 (L) 11/05/2022 0418   GFRNONAA 49 (L) 11/04/2022 1348   GFRNONAA 49 (L) 01/09/2021 0959   GFRNONAA 48 (L) 07/12/2020 1101   GFRNONAA 50 (L) 04/05/2020 1015   GFRAA 57 (L) 01/09/2021 0959   GFRAA 55 (L) 07/12/2020 1101   GFRAA 58 (L) 04/05/2020 1015   CBC    Component Value Date/Time   WBC 6.9 11/06/2022 0051   RBC 4.19 (L) 11/06/2022 0051   HGB 13.5 11/06/2022 0051   HCT 39.0 11/06/2022 0051   PLT 122 (L) 11/06/2022 0051   MCV 93.1 11/06/2022 0051   MCH 32.2 11/06/2022 0051   MCHC 34.6 11/06/2022 0051   RDW 14.9 11/06/2022 0051   LYMPHSABS 1,880 11/04/2022 0000   MONOABS 0.5 03/17/2022 1140   EOSABS 150 11/04/2022 0000   BASOSABS 20 11/04/2022 0000   HEPATIC Function Panel Recent Labs    03/17/22 1140 07/24/22 0000 11/04/22 0000  PROT 6.5 6.4 6.4  ALBUMIN 3.7  --   --   AST 25  47* 42*  ALT '12 24 22  '$ ALKPHOS 81  --   --    HEMOGLOBIN A1C Lab Results  Component Value Date   MPG 111 11/04/2022   CARDIAC ENZYMES No results found for: "CKTOTAL", "CKMB", "CKMBINDEX", "TROPONINI" BNP No results for input(s): "PROBNP" in the last 8760 hours. TSH Recent Labs    01/09/22 1108 07/24/22 0000 11/04/22 1734  TSH 1.81 2.20 2.068   CHOLESTEROL Recent Labs    01/09/22 1108 07/24/22 0000 11/04/22 0000  CHOL 137 124 125    Scheduled Meds:  diltiazem  60 mg Oral QID   LORazepam  0.5 mg Oral QHS   metoprolol tartrate  25 mg Oral BID   pantoprazole  40 mg Oral Daily   rosuvastatin  40 mg Oral Daily   sertraline  100 mg Oral Daily   Continuous Infusions:  heparin  1,350 Units/hr (11/05/22 1806)   PRN Meds:.acetaminophen, lactulose, methocarbamol, metoprolol tartrate  Assessment/Plan: Atrial fibrillation with controlled ventricular response HTN HLD OSA IBS BPH H/O liver cirrhosis Fibromyalgia CKD, IIIb  Plan: TEE with possible external cardioversion tomorrow. NPO after mid-night.   LOS: 2 days   Time spent including chart review, lab review, examination, discussion with patient/Doctor/Nurse : 30 min   Dixie Dials  MD  11/06/2022, 4:54 PM

## 2022-11-06 NOTE — Plan of Care (Signed)
  Problem: Education: ?Goal: Knowledge of General Education information will improve ?Description: Including pain rating scale, medication(s)/side effects and non-pharmacologic comfort measures ?Outcome: Progressing ?  ?Problem: Health Behavior/Discharge Planning: ?Goal: Ability to manage health-related needs will improve ?Outcome: Progressing ?  ?Problem: Clinical Measurements: ?Goal: Will remain free from infection ?Outcome: Progressing ?  ?

## 2022-11-07 ENCOUNTER — Encounter (HOSPITAL_COMMUNITY): Payer: Self-pay | Admitting: Internal Medicine

## 2022-11-07 ENCOUNTER — Inpatient Hospital Stay (HOSPITAL_COMMUNITY): Payer: Medicare Other

## 2022-11-07 ENCOUNTER — Inpatient Hospital Stay (HOSPITAL_COMMUNITY): Payer: Medicare Other | Admitting: Certified Registered Nurse Anesthetist

## 2022-11-07 ENCOUNTER — Encounter (HOSPITAL_COMMUNITY)
Admission: EM | Disposition: A | Discharge status: Home Health | Payer: Self-pay | Source: Home / Self Care | Attending: Family Medicine

## 2022-11-07 ENCOUNTER — Other Ambulatory Visit (HOSPITAL_COMMUNITY): Payer: Self-pay

## 2022-11-07 DIAGNOSIS — I480 Paroxysmal atrial fibrillation: Secondary | ICD-10-CM | POA: Diagnosis not present

## 2022-11-07 DIAGNOSIS — I4891 Unspecified atrial fibrillation: Secondary | ICD-10-CM

## 2022-11-07 DIAGNOSIS — I509 Heart failure, unspecified: Secondary | ICD-10-CM

## 2022-11-07 DIAGNOSIS — I110 Hypertensive heart disease with heart failure: Secondary | ICD-10-CM

## 2022-11-07 DIAGNOSIS — N289 Disorder of kidney and ureter, unspecified: Secondary | ICD-10-CM

## 2022-11-07 DIAGNOSIS — G4730 Sleep apnea, unspecified: Secondary | ICD-10-CM

## 2022-11-07 HISTORY — PX: TEE WITHOUT CARDIOVERSION: SHX5443

## 2022-11-07 HISTORY — PX: CARDIOVERSION: SHX1299

## 2022-11-07 HISTORY — PX: BUBBLE STUDY: SHX6837

## 2022-11-07 LAB — CBC
HCT: 37.9 % — ABNORMAL LOW (ref 39.0–52.0)
Hemoglobin: 13.2 g/dL (ref 13.0–17.0)
MCH: 32 pg (ref 26.0–34.0)
MCHC: 34.8 g/dL (ref 30.0–36.0)
MCV: 91.8 fL (ref 80.0–100.0)
Platelets: 116 10*3/uL — ABNORMAL LOW (ref 150–400)
RBC: 4.13 MIL/uL — ABNORMAL LOW (ref 4.22–5.81)
RDW: 14.8 % (ref 11.5–15.5)
WBC: 6.6 10*3/uL (ref 4.0–10.5)
nRBC: 0 % (ref 0.0–0.2)

## 2022-11-07 LAB — URINALYSIS, COMPLETE (UACMP) WITH MICROSCOPIC
Bacteria, UA: NONE SEEN
Bilirubin Urine: NEGATIVE
Glucose, UA: NEGATIVE mg/dL
Hgb urine dipstick: NEGATIVE
Ketones, ur: NEGATIVE mg/dL
Leukocytes,Ua: NEGATIVE
Nitrite: NEGATIVE
Protein, ur: 30 mg/dL — AB
Specific Gravity, Urine: 1.015 (ref 1.005–1.030)
pH: 5 (ref 5.0–8.0)

## 2022-11-07 LAB — BASIC METABOLIC PANEL
Anion gap: 6 (ref 5–15)
BUN: 15 mg/dL (ref 8–23)
CO2: 22 mmol/L (ref 22–32)
Calcium: 8.8 mg/dL — ABNORMAL LOW (ref 8.9–10.3)
Chloride: 106 mmol/L (ref 98–111)
Creatinine, Ser: 2.22 mg/dL — ABNORMAL HIGH (ref 0.61–1.24)
GFR, Estimated: 30 mL/min — ABNORMAL LOW (ref >60)
Glucose, Bld: 110 mg/dL — ABNORMAL HIGH (ref 70–99)
Potassium: 3.9 mmol/L (ref 3.5–5.1)
Sodium: 134 mmol/L — ABNORMAL LOW (ref 135–145)

## 2022-11-07 LAB — PROTEIN / CREATININE RATIO, URINE
Creatinine, Urine: 236 mg/dL
Protein Creatinine Ratio: 0.12 mg/mg{Cre} (ref 0.00–0.15)
Total Protein, Urine: 28 mg/dL

## 2022-11-07 LAB — ECHO TEE

## 2022-11-07 LAB — MAGNESIUM: Magnesium: 2.2 mg/dL (ref 1.7–2.4)

## 2022-11-07 LAB — SODIUM, URINE, RANDOM: Sodium, Ur: 26 mmol/L

## 2022-11-07 LAB — HEPARIN LEVEL (UNFRACTIONATED): Heparin Unfractionated: 0.64 IU/mL (ref 0.30–0.70)

## 2022-11-07 SURGERY — ECHOCARDIOGRAM, TRANSESOPHAGEAL
Anesthesia: General

## 2022-11-07 MED ORDER — PANTOPRAZOLE SODIUM 40 MG PO TBEC
40.0000 mg | DELAYED_RELEASE_TABLET | Freq: Every day | ORAL | 0 refills | Status: DC
  Filled 2022-11-07: qty 30, 30d supply, fill #0

## 2022-11-07 MED ORDER — PROPOFOL 500 MG/50ML IV EMUL
INTRAVENOUS | Status: DC | PRN
  Administered 2022-11-07: 80 ug/kg/min via INTRAVENOUS

## 2022-11-07 MED ORDER — PROPOFOL 10 MG/ML IV BOLUS
INTRAVENOUS | Status: DC | PRN
  Administered 2022-11-07: 50 mg via INTRAVENOUS
  Administered 2022-11-07: 40 mg via INTRAVENOUS

## 2022-11-07 MED ORDER — PHENYLEPHRINE HCL-NACL 20-0.9 MG/250ML-% IV SOLN
INTRAVENOUS | Status: DC | PRN
  Administered 2022-11-07: 40 ug/min via INTRAVENOUS

## 2022-11-07 MED ORDER — METOPROLOL SUCCINATE ER 50 MG PO TB24
50.0000 mg | ORAL_TABLET | Freq: Every day | ORAL | 0 refills | Status: DC
  Filled 2022-11-07: qty 30, 30d supply, fill #0

## 2022-11-07 MED ORDER — METOPROLOL TARTRATE 5 MG/5ML IV SOLN
INTRAVENOUS | Status: AC
  Filled 2022-11-07: qty 5

## 2022-11-07 MED ORDER — SODIUM CHLORIDE 0.9 % IV SOLN: INTRAVENOUS | Status: DC

## 2022-11-07 MED ORDER — DILTIAZEM HCL ER COATED BEADS 180 MG PO CP24
180.0000 mg | ORAL_CAPSULE | Freq: Every day | ORAL | 0 refills | Status: DC
  Filled 2022-11-07: qty 30, 30d supply, fill #0

## 2022-11-07 MED ORDER — ESMOLOL HCL 100 MG/10ML IV SOLN
INTRAVENOUS | Status: DC | PRN
  Administered 2022-11-07 (×2): 30 mg via INTRAVENOUS

## 2022-11-07 MED ORDER — LOSARTAN POTASSIUM 25 MG PO TABS
25.0000 mg | ORAL_TABLET | Freq: Every day | ORAL | 0 refills | Status: DC
  Filled 2022-11-07: qty 30, 30d supply, fill #0

## 2022-11-07 MED ORDER — DIGOXIN 0.25 MG/ML IJ SOLN
0.2500 mg | Freq: Once | INTRAMUSCULAR | Status: AC
  Administered 2022-11-07: .25 mg via INTRAVENOUS
  Filled 2022-11-07 (×2): qty 1

## 2022-11-07 MED ORDER — ETOMIDATE 2 MG/ML IV SOLN
INTRAVENOUS | Status: DC | PRN
  Administered 2022-11-07: 6 mg via INTRAVENOUS

## 2022-11-07 MED ORDER — APIXABAN 5 MG PO TABS
5.0000 mg | ORAL_TABLET | Freq: Two times a day (BID) | ORAL | 0 refills | Status: DC
  Filled 2022-11-07: qty 60, 30d supply, fill #0

## 2022-11-07 MED ORDER — METOPROLOL TARTRATE 5 MG/5ML IV SOLN: 5.0000 mg | Freq: Once | INTRAVENOUS | Status: DC

## 2022-11-07 MED ORDER — LACTATED RINGERS IV SOLN: INTRAVENOUS | Status: AC

## 2022-11-07 MED ORDER — PHENYLEPHRINE HCL (PRESSORS) 10 MG/ML IV SOLN
INTRAVENOUS | Status: DC | PRN
  Administered 2022-11-07 (×2): 160 ug via INTRAVENOUS
  Administered 2022-11-07 (×2): 80 ug via INTRAVENOUS

## 2022-11-07 MED ORDER — APIXABAN 5 MG PO TABS
5.0000 mg | ORAL_TABLET | Freq: Two times a day (BID) | ORAL | Status: DC
  Administered 2022-11-07 – 2022-11-08 (×3): 5 mg via ORAL
  Filled 2022-11-07 (×3): qty 1

## 2022-11-07 NOTE — Progress Notes (Signed)
  Pt states he wears CPAP at home, but does not want to wear one here at hospital

## 2022-11-07 NOTE — Progress Notes (Signed)
Per chart review, currently in procedure/surgery- will attempt to return later if time/schedule and post-op precautions allow.  Deniece Ree PT DPT PN2

## 2022-11-07 NOTE — TOC Transition Note (Signed)
Discharge medications (5) are being stored in the main pharmacy on the ground floor until patient is ready for discharge.   

## 2022-11-07 NOTE — Progress Notes (Signed)
Subjective:  Patient successfully underwent TEE assisted synchronized C cardioversion.  Remains in sinus rhythm with right bundle branch block pattern.  Denies any chest pain or shortness of breath Little groggy after the procedure.  Objective:  Vital Signs in the last 24 hours: Temp:  [97.4 F (36.3 C)-97.9 F (36.6 C)] 97.5 F (36.4 C) (02/02 0830) Pulse Rate:  [57-96] 57 (02/02 1030) Resp:  [14-30] 17 (02/02 1030) BP: (91-138)/(54-81) 138/68 (02/02 1030) SpO2:  [91 %-99 %] 99 % (02/02 1030) Weight:  [94.3 kg] 94.3 kg (02/02 0658)  Intake/Output from previous day: 02/01 0701 - 02/02 0700 In: 640.3 [P.O.:170; I.V.:470.3] Out: 650 [Urine:650] Intake/Output from this shift: Total I/O In: 250 [I.V.:250] Out: 0   Physical Exam: Neck: no adenopathy, no carotid bruit, no JVD, and supple, symmetrical, trachea midline Lungs: clear to auscultation bilaterally Heart: regular rate and rhythm, S1, S2 normal, and 2/6 systolic murmur noted Abdomen: soft, non-tender; bowel sounds normal; no masses,  no organomegaly Extremities: extremities normal, atraumatic, no cyanosis or edema  Lab Results: Recent Labs    11/06/22 0051 11/07/22 0140  WBC 6.9 6.6  HGB 13.5 13.2  PLT 122* 116*   Recent Labs    11/06/22 0051 11/07/22 0140  NA 136 134*  K 4.0 3.9  CL 103 106  CO2 24 22  GLUCOSE 114* 110*  BUN 10 15  CREATININE 1.90* 2.22*   No results for input(s): "TROPONINI" in the last 72 hours.  Invalid input(s): "CK", "MB" Hepatic Function Panel No results for input(s): "PROT", "ALBUMIN", "AST", "ALT", "ALKPHOS", "BILITOT", "BILIDIR", "IBILI" in the last 72 hours. No results for input(s): "CHOL" in the last 72 hours. No results for input(s): "PROTIME" in the last 72 hours.  Imaging: Imaging results have been reviewed and ECHOCARDIOGRAM COMPLETE  Result Date: 11/05/2022    ECHOCARDIOGRAM REPORT   Patient Name:   Cody Hines. Date of Exam: 11/05/2022 Medical Rec #:   [id number]                   Height:       73.0 in Accession #:    [id number]                  Weight:       206.8 lb Date of Birth:  77 years-18-1947                  BSA:          2.182 m Patient Age:    77 years                    BP:           103/88 mmHg Patient Gender: M                           HR:           112 bpm. Exam Location:  Inpatient Procedure: 2D Echo, Cardiac Doppler and Color Doppler Indications:    R94.31 Abnormal EKG  History:        Patient has no prior history of Echocardiogram examinations.                 Risk Factors:Hypertension and Dyslipidemia.  Sonographer:    Phineas Douglas Referring Phys: [id number] [place]  1. Left ventricular ejection fraction, by estimation, is 45 to 50%. The left ventricle has mildly decreased function. The  left ventricle demonstrates global hypokinesis. There is mild concentric left ventricular hypertrophy. Left ventricular diastolic parameters are indeterminate.  2. Right ventricular systolic function is normal. The right ventricular size is normal. There is normal pulmonary artery systolic pressure.  3. Right atrial size was severely dilated.  4. There is no evidence of cardiac tamponade.  5. The mitral valve is normal in structure. Trivial mitral valve regurgitation. No evidence of mitral stenosis.  6. The aortic valve is tricuspid. Aortic valve regurgitation is not visualized. No aortic stenosis is present.  7. The inferior vena cava is dilated in size with <50% respiratory variability, suggesting right atrial pressure of 15 mmHg. FINDINGS  Left Ventricle: Left ventricular ejection fraction, by estimation, is 45 to 50%. The left ventricle has mildly decreased function. The left ventricle demonstrates global hypokinesis. The left ventricular internal cavity size was normal in size. There is  mild concentric left ventricular hypertrophy. Left ventricular diastolic parameters are indeterminate. Indeterminate filling pressures. Right Ventricle: The right  ventricular size is normal. No increase in right ventricular wall thickness. Right ventricular systolic function is normal. There is normal pulmonary artery systolic pressure. The tricuspid regurgitant velocity is 2.05 m/s, and  with an assumed right atrial pressure of 15 mmHg, the estimated right ventricular systolic pressure is 38.1 mmHg. Left Atrium: Left atrial size was normal in size. Right Atrium: Right atrial size was severely dilated. Pericardium: Trivial pericardial effusion is present. There is no evidence of cardiac tamponade. Mitral Valve: The mitral valve is normal in structure. Trivial mitral valve regurgitation. No evidence of mitral valve stenosis. Tricuspid Valve: The tricuspid valve is normal in structure. Tricuspid valve regurgitation is mild . No evidence of tricuspid stenosis. Aortic Valve: The aortic valve is tricuspid. Aortic valve regurgitation is not visualized. No aortic stenosis is present. Pulmonic Valve: The pulmonic valve was normal in structure. Pulmonic valve regurgitation is not visualized. No evidence of pulmonic stenosis. Aorta: The aortic root is normal in size and structure. Venous: The inferior vena cava is dilated in size with less than 50% respiratory variability, suggesting right atrial pressure of 15 mmHg. IAS/Shunts: No atrial level shunt detected by color flow Doppler.  LEFT VENTRICLE PLAX 2D LVIDd:         4.30 cm      Diastology LVIDs:         3.30 cm      LV e' medial:    9.25 cm/s LV PW:         1.10 cm      LV E/e' medial:  11.2 LV IVS:        1.10 cm      LV e' lateral:   10.80 cm/s LVOT diam:     2.10 cm      LV E/e' lateral: 9.6 LV SV:         50 LV SV Index:   23 LVOT Area:     3.46 cm  LV Volumes (MOD) LV vol d, MOD A2C: 116.0 ml LV vol d, MOD A4C: 103.0 ml LV vol s, MOD A2C: 62.2 ml LV vol s, MOD A4C: 57.9 ml LV SV MOD A2C:     53.8 ml LV SV MOD A4C:     103.0 ml LV SV MOD BP:      55.7 ml RIGHT VENTRICLE             IVC RV Basal diam:  4.80 cm     IVC diam:  2.60 cm RV S  prime:     12.10 cm/s TAPSE (M-mode): 1.9 cm LEFT ATRIUM             Index        RIGHT ATRIUM           Index LA diam:        4.40 cm 2.02 cm/m   RA Area:     28.90 cm LA Vol (A2C):   55.8 ml 25.57 ml/m  RA Volume:   102.00 ml 46.74 ml/m LA Vol (A4C):   50.0 ml 22.91 ml/m LA Biplane Vol: 53.9 ml 24.70 ml/m  AORTIC VALVE LVOT Vmax:   79.40 cm/s LVOT Vmean:  54.400 cm/s LVOT VTI:    0.143 m  AORTA Ao Root diam: 3.20 cm Ao Asc diam:  2.90 cm MITRAL VALVE                TRICUSPID VALVE MV Area (PHT): 3.93 cm     TR Peak grad:   16.8 mmHg MV Decel Time: 193 msec     TR Vmax:        205.00 cm/s MV E velocity: 104.00 cm/s                             SHUNTS                             Systemic VTI:  0.14 m                             Systemic Diam: 2.10 cm Skeet Latch MD Electronically signed by Skeet Latch MD Signature Date/Time: 11/05/2022/12:27:50 PM    Final     Cardiac Studies:  Assessment/Plan:  New onset A-fib with RVR CHA2DS2-VASc score of 2 Status post TEE sisters synchronized DC cardioversion Status post noncardiac chest pain Hypertension Hyperlipidemia Obstructive sleep apnea Irritable bowel syndrome Benign hypertrophy of prostate History of cirrhosis of the liver Fibromyalgia Plan Switch IV heparin to Eliquis Current switch Cardizem to 180 mg, Cardizem CD daily and switch metoprolol tartrate to metoprolol succinate 50 mg daily Add losartan as blood pressure tolerates. Okay to discharge from cardiac point of view.  Follow-up with me in one to 2 weeks  LOS: 3 days    Charolette Forward 11/07/2022, 10:54 AM

## 2022-11-07 NOTE — Transfer of Care (Signed)
Immediate Anesthesia Transfer of Care Note  Patient: Cody Hines.  Procedure(s) Performed: TRANSESOPHAGEAL ECHOCARDIOGRAM (TEE) CARDIOVERSION BUBBLE STUDY  Patient Location: PACU  Anesthesia Type:General  Level of Consciousness: awake, alert , and oriented  Airway & Oxygen Therapy: Patient connected to face mask oxygen  Post-op Assessment: Report given to RN, Post -op Vital signs reviewed and stable, Patient moving all extremities X 4, and Patient able to stick tongue midline  Post vital signs: Reviewed  Last Vitals:  Vitals Value Taken Time  BP 128/78 11/07/22 0830  Temp 97.6   Pulse 57 11/07/22 0832  Resp 26 11/07/22 0832  SpO2 93 % 11/07/22 0832  Vitals shown include unvalidated device data.  Last Pain:  Vitals:   11/07/22 0658  TempSrc:   PainSc: 6       Patients Stated Pain Goal: 5 (10/40/45 9136)  Complications: No notable events documented.

## 2022-11-07 NOTE — Discharge Instructions (Signed)
Recommendations at discharge:  Follow up with PCP in 1-2 weeks, suggest recheck BMP and CBC.  Follow up with cardiology, Dr. Sharyn Lull, in the next 2 weeks. Continued on cardizem, metoprolol, losartan (replaces enalapril) for mildly reduced LV function and to maintain sinus rhythm s/p DCCV 2/2.     Information on my medicine - ELIQUIS (apixaban)  This medication education was reviewed with me or my healthcare representative as part of my discharge preparation.     Why was Eliquis prescribed for you? Eliquis was prescribed for you to reduce the risk of a blood clot forming that can cause a stroke if you have a medical condition called atrial fibrillation (a type of irregular heartbeat).  What do You need to know about Eliquis ? Take your Eliquis TWICE DAILY - one tablet in the morning and one tablet in the evening with or without food. If you have difficulty swallowing the tablet whole please discuss with your pharmacist how to take the medication safely.  Take Eliquis exactly as prescribed by your doctor and DO NOT stop taking Eliquis without talking to the doctor who prescribed the medication.  Stopping may increase your risk of developing a stroke.  Refill your prescription before you run out.  After discharge, you should have regular check-up appointments with your healthcare provider that is prescribing your Eliquis.  In the future your dose may need to be changed if your kidney function or weight changes by a significant amount or as you get older.  What do you do if you miss a dose? If you miss a dose, take it as soon as you remember on the same day and resume taking twice daily.  Do not take more than one dose of ELIQUIS at the same time to make up a missed dose.  Important Safety Information A possible side effect of Eliquis is bleeding. You should call your healthcare provider right away if you experience any of the following: Bleeding from an injury or your nose that does  not stop. Unusual colored urine (red or dark brown) or unusual colored stools (red or black). Unusual bruising for unknown reasons. A serious fall or if you hit your head (even if there is no bleeding).  Some medicines may interact with Eliquis and might increase your risk of bleeding or clotting while on Eliquis. To help avoid this, consult your healthcare provider or pharmacist prior to using any new prescription or non-prescription medications, including herbals, vitamins, non-steroidal anti-inflammatory drugs (NSAIDs) and supplements.  This website has more information on Eliquis (apixaban): http://www.eliquis.com/eliquis/home =============================================================  Atrial Fibrillation    Atrial fibrillation is a type of heartbeat that is irregular or fast. If you have this condition, your heart beats without any order. This makes it hard for your heart to pump blood in a normal way. Atrial fibrillation may come and go, or it may become a long-lasting problem. If this condition is not treated, it can put you at higher risk for stroke, heart failure, and other heart problems.  What are the causes? This condition may be caused by diseases that damage the heart. They include: High blood pressure. Heart failure. Heart valve disease. Heart surgery. Other causes include: Diabetes. Thyroid disease. Being overweight. Kidney disease. Sometimes the cause is not known.  What increases the risk? You are more likely to develop this condition if: You are older. You smoke. You exercise often and very hard. You have a family history of this condition. You are a man. You use drugs.  You drink a lot of alcohol. You have lung conditions, such as emphysema, pneumonia, or COPD. You have sleep apnea.  What are the signs or symptoms? Common symptoms of this condition include: A feeling that your heart is beating very fast. Chest pain or discomfort. Feeling short of  breath. Suddenly feeling light-headed or weak. Getting tired easily during activity. Fainting. Sweating. In some cases, there are no symptoms.  How is this treated? Treatment for this condition depends on underlying conditions and how you feel when you have atrial fibrillation. They include: Medicines to: Prevent blood clots. Treat heart rate or heart rhythm problems. Using devices, such as a pacemaker, to correct heart rhythm problems. Doing surgery to remove the part of the heart that sends bad signals. Closing an area where clots can form in the heart (left atrial appendage). In some cases, your doctor will treat other underlying conditions.  Follow these instructions at home:  Medicines Take over-the-counter and prescription medicines only as told by your doctor. Do not take any new medicines without first talking to your doctor. If you are taking blood thinners: Talk with your doctor before you take any medicines that have aspirin or NSAIDs, such as ibuprofen, in them. Take your medicine exactly as told by your doctor. Take it at the same time each day. Avoid activities that could hurt or bruise you. Follow instructions about how to prevent falls. Wear a bracelet that says you are taking blood thinners. Or, carry a card that lists what medicines you take. Lifestyle         Do not use any products that have nicotine or tobacco in them. These include cigarettes, e-cigarettes, and chewing tobacco. If you need help quitting, ask your doctor. Eat heart-healthy foods. Talk with your doctor about the right eating plan for you. Exercise regularly as told by your doctor. Do not drink alcohol. Lose weight if you are overweight. Do not use drugs, including cannabis.  General instructions If you have a condition that causes breathing to stop for a short period of time (apnea), treat it as told by your doctor. Keep a healthy weight. Do not use diet pills unless your doctor says they  are safe for you. Diet pills may make heart problems worse. Keep all follow-up visits as told by your doctor. This is important.  Contact a doctor if: You notice a change in the speed, rhythm, or strength of your heartbeat. You are taking a blood-thinning medicine and you get more bruising. You get tired more easily when you move or exercise. You have a sudden change in weight.  Get help right away if:    You have pain in your chest or your belly (abdomen). You have trouble breathing. You have side effects of blood thinners, such as blood in your vomit, poop (stool), or pee (urine), or bleeding that cannot stop. You have any signs of a stroke. "BE FAST" is an easy way to remember the main warning signs: B - Balance. Signs are dizziness, sudden trouble walking, or loss of balance. E - Eyes. Signs are trouble seeing or a change in how you see. F - Face. Signs are sudden weakness or loss of feeling in the face, or the face or eyelid drooping on one side. A - Arms. Signs are weakness or loss of feeling in an arm. This happens suddenly and usually on one side of the body. S - Speech. Signs are sudden trouble speaking, slurred speech, or trouble understanding what people say.  T - Time. Time to call emergency services. Write down what time symptoms started. You have other signs of a stroke, such as: A sudden, very bad headache with no known cause. Feeling like you may vomit (nausea). Vomiting. A seizure.  These symptoms may be an emergency. Do not wait to see if the symptoms will go away. Get medical help right away. Call your local emergency services (911 in the [place] Do not drive yourself to the hospital. Summary Atrial fibrillation is a type of heartbeat that is irregular or fast. You are at higher risk of this condition if you smoke, are older, have diabetes, or are overweight. Follow your doctor's instructions about medicines, diet, exercise, and follow-up visits. Get help right away  if you have signs or symptoms of a stroke. Get help right away if you cannot catch your breath, or you have chest pain or discomfort. This information is not intended to replace advice given to you by your health care provider. Make sure you discuss any questions you have with your health care provider. Document Revised: 03/16/2019 Document Reviewed: 03/16/2019 Elsevier Patient Education  2020 [employer].

## 2022-11-07 NOTE — CV Procedure (Signed)
PRE-OP DIAGNOSIS:  Atrial fibrillation.  POST-OP DIAGNOSIS:  Sinus rhythm.  OPERATOR:  Dixie Dials, MD .     ANESTHESIA:  270 mg. Of IV propofol..  COMPLICATIONS:  None.   OPERATIVE TERM:  DC cardioversion.  The nature of the procedure, risks and alternatives were discussed with the patient who gave informed consent.  OPERATIVE TECHNIQUE:  The patient was sedated with 270 mg. Of propofol as part od TEE to clear for intracardiac blood clot.  When the patient was no longer responsive to quiet voice, DC cardioversion was performed with 120 J biphasically and synchronously.  Successful cardioversion to sinus rhythm.  The patient was then monitored until fully alert and left the procedure area in stable condition.  IMPRESSION:  Successful DC cardioversion to sinus rhythm.

## 2022-11-07 NOTE — CV Procedure (Signed)
INDICATIONS:   The patient is 77 years old male has atrial fibrillation with moderate ventricular rate response.Cody Hines  PROCEDURE:  Informed consent was discussed including risks, benefits and alternatives for the procedure.  Risks include, but are not limited to, cough, sore throat, vomiting, nausea, somnolence, esophageal and stomach trauma or perforation, bleeding, low blood pressure, aspiration, pneumonia, infection, trauma to the teeth and death.    Patient was given sedation of 270 mg of propofol.  The oropharynx was anesthetized with topical lidocaine.  The transesophageal probe was inserted in the esophagus and stomach and multiple views were obtained.  Agitated saline was used after the transesophageal probe was removed from the body.  The patient was kept under observation until the patient left the procedure room.  The patient left the procedure room in stable condition.   COMPLICATIONS:  There were no immediate complications.  FINDINGS:  LEFT VENTRICLE: The left ventricle is normal in structure and has mild generalized dysfunction.  Wall motion is preserved with LVEF 45-50 %.  No thrombus or masses seen in the left ventricle.  RIGHT VENTRICLE:  The right ventricle is dilated and has mild dysfunction.without any thrombus or masses.    LEFT ATRIUM:  The left atrium is moderately dilated without any thrombus or masses.  LEFT ATRIAL APPENDAGE:  The left atrial appendage is free of any thrombus or masses.  RIGHT ATRIUM:  The right atrium is severely dilated and is free of any thrombus or masses.    ATRIAL SEPTUM:  The atrial septum is normal without any ASD but positive for PFO by sonicated saline injection.  MITRAL VALVE:  The mitral valve is normal in structure and function with moderate central jet regurgitation, no masses, stenosis or vegetations.  TRICUSPID VALVE:  The tricuspid valve is normal in structure and function with moderate regurgitation, no masses, stenosis or  vegetations.  AORTIC VALVE:  The aortic valve is calcific, thick but moves well. It has mild regurgitation, no masses, stenosis or vegetations.  PULMONIC VALVE:  The pulmonic valve is normal in structure and function with trivial regurgitation, no masses, stenosis or vegetations.  AORTIC ARCH, ASCENDING AND DESCENDING AORTA:  The aorta had mild to moderate atherosclerosis in the descending aorta.  The aortic arch was normal.   Superior Vena Cava : No thrombus or catheter.   Pulmonary Veins: Visible.   Pulmonary artery: visible and normal.   IMPRESSION:   Preserved LV systolic function. Mild RV systolic dysfunction with dilated RV Moderate MR and moderate TR Moderately dilated LA and severely dilated RA Positive PFO by sonicated saline injection.  RECOMMENDATIONS:    Patient underwent external cardioversion resulting in SR.Cody Hines

## 2022-11-07 NOTE — Progress Notes (Signed)
  Echocardiogram Echocardiogram Transesophageal has been performed.  Cody Hines 11/07/2022, 8:45 AM

## 2022-11-07 NOTE — Progress Notes (Signed)
ANTICOAGULATION CONSULT NOTE  Pharmacy Consult for heparin Indication: atrial fibrillation  Allergies  Allergen Reactions   Asa [Aspirin] Diarrhea, Nausea Only and Other (See Comments)    History of ulcers, also   Atorvastatin Other (See Comments)    Muscle aches   Celebrex [Celecoxib] Other (See Comments)    Headaches    Codeine Nausea And Vomiting   Savella [Milnacipran Hcl] Other (See Comments)    Reaction not recalled   Viagra [Sildenafil Citrate] Other (See Comments)    Headaches   Penicillins Rash     Vital Signs: Temp: 97.5 F (36.4 C) (02/02 0830) Temp Source: Tympanic (02/02 0830) BP: 125/69 (02/02 0921) Pulse Rate: 60 (02/02 0921)  Labs: Recent Labs    11/04/22 1348 11/04/22 1734 11/05/22 0001 11/05/22 0418 11/06/22 0051 11/07/22 0140  HGB 13.8  --   --  13.5 13.5 13.2  HCT 40.7  --   --  40.0 39.0 37.9*  PLT 116*  --   --  131* 122* 116*  LABPROT  --  17.4*  --  18.3*  --   --   INR  --  1.4*  --  1.5*  --   --   HEPARINUNFRC  --   --    < > 0.35 0.51 0.64  CREATININE 1.50*  --   --  1.85* 1.90* 2.22*  TROPONINIHS 8 10  --   --   --   --    < > = values in this interval not displayed.    Estimated Creatinine Clearance: 33 mL/min (A) (by C-G formula based on SCr of 2.22 mg/dL (H)).   Assessment: Patient presenting to the ED from doctors office with new afib with RVR. No AC PTA. Pharmacy consulted to dose heparin. Hx chronic thrombocytopenia.  Heparin level 0.64 is therapeutic but up trending on 1350 units/hr. CBC stable. Patient was successfully cardioverted 2/2. Discussed with MD, ok to switch to apixaban. Noted Scr trending up but patient does not meet criteria for apixaban dose decrease.   Goal of Therapy:  Heparin level 0.3-0.7 units/ml Monitor platelets by anticoagulation protocol: Yes   Plan:  Stop heparin Start apixaban '5mg'$  BID Monitor for signs/symptoms of bleeding    Benetta Spar, PharmD, BCPS, BCCP Clinical Pharmacist  Please  check AMION for all [place] phone numbers After 10:00 PM, call [place] [phone]

## 2022-11-07 NOTE — Progress Notes (Signed)
Pt refuses CPAP will sleep on room air until unable to tolerate. Will continue to monitor

## 2022-11-07 NOTE — Progress Notes (Signed)
OT Cancellation Note  Patient Details Name: Cody Hines. MRN: [id number] DOB: 1946/01/08   Cancelled Treatment:    Reason Eval/Treat Not Completed: Patient at procedure or test/ unavailable. Will attempt later time.  Delmer Kowalski,HILLARY 11/07/2022, 7:39 AM Maurie Boettcher, OT/L   Acute OT Clinical Specialist Acute Rehabilitation Services Pager [phone] Office [phone]

## 2022-11-07 NOTE — Discharge Summary (Incomplete)
Physician Discharge Summary   Patient: Cody Hines. MRN: [id number] DOB: [birth year]  Admit date:     11/04/2022  Discharge date: 11/08/22  Discharge Physician: Patrecia Pour   PCP: [name], MD   Recommendations at discharge:  Follow up with PCP in 1-2 weeks, suggest recheck BMP and CBC.  Follow up with cardiology, Dr. Terrence Dupont, in the next 2 weeks. Continued on cardizem, metoprolol, losartan (replaces enalapril) for mildly reduced LV function and to maintain sinus rhythm s/p DCCV 2/2.  Discharge Diagnoses: Principal Problem:   A-fib [facility]) Active Problems:   Essential hypertension   CKD (chronic kidney disease) stage 3, GFR 30-59 ml/min (HCC)   Cirrhosis [facility])  Hospital Course: Cody Hines. is a 77 y.o. male with medical history significant of liver cirrhosis, HTN, HLD, CKD stage II, OSA, sent from PCPs office for evaluation of rapid atrial fibrillation. He complained of chest pain and was sent to the ED where he was admitted with diltiazem and heparin infusions. Troponins negative. Cardiology consulted, recommending TEE-DCCV which the patient underwent 2/2 by Dr. Doylene Canard with restoration of sinus rhythm. Having tolerated GDMT and rate control medications, he is cleared for discharge by the medical and cardiology teams once he recovers from procedural sedation.    Assessment and Plan: AFib with RVR: New diagnosis. TSH 2.068 - Rate remained uncontrolled, further titration was limited by hypotension, underwent successful TEE-DCCV 2/2. Plan to continue dilt/metop per cardiology, Dr. Terrence Dupont.  - Eliquis sent to [place]. DC aspirin while starting that. Give PPI given risk of GI bleeding as well.    Chronic HFrEF: Newly diagnosed, not acutely overloaded. LVEF 45-50%, global hypokinesis, mild LVH, normal size/function of RV, nl PASP. Severe RAE. IVC is dilated and blunted. Likely plethoric due to rapid AFib at time of exam.  - Starting ARB (replaces  home ACEi), metop succinate. Monitor for tolerance, follow up with cardiology after DC.   Hepatic cirrhosis, portal HTN: Hx hepatic encephalopathy (none currently).  - Continue lactulose.  - Monitor thrombocytopenia, stable. Albumin 3.6. No ascites.  - Continue PPI given initiation of anticoagulation   HTN:  - Meds as above   HLD: LDL is 60.  - Continue rosuvastatin   OSA:  - CPAP qHS   Depression:  - Continue SSRI   Stage IIIa CKD: SCr has peaked and expected to continue improvement. UA bland, renal U/S with normal left, atrophic right kidney. UOP normal. Now that out of AFib w/RVR, anticipate continued improvement.  - Avoid nephrotoxins.  - Monitor at follow up   IBS: Reports loose stools, though between IBS and lactulose, with benign abdominal exam, no leukocytosis, no further work up is currently planned.   Consultants: Cardiology Procedures performed: TEE-DCCV Dr. Doylene Canard 11/07/2022  Disposition: Home Diet recommendation:  Discharge Diet Orders (From admission, onward)     Start     Ordered   11/07/22 0000  Diet - low sodium heart healthy        11/07/22 1115           Cardiac diet DISCHARGE MEDICATION: Allergies as of 11/08/2022       Reactions   Asa [aspirin] Diarrhea, Nausea Only, Other (See Comments)   History of ulcers, also   Atorvastatin Other (See Comments)   Muscle aches   Celebrex [celecoxib] Other (See Comments)   Headaches   Codeine Nausea And Vomiting   Savella [milnacipran Hcl] Other (See Comments)   Reaction not recalled   Viagra [sildenafil Citrate]  Other (See Comments)   Headaches   Penicillins Rash        Medication List     STOP taking these medications    aspirin EC 81 MG tablet   enalapril 20 MG tablet Commonly known as: VASOTEC       TAKE these medications    acetaminophen 650 MG CR tablet Commonly known as: TYLENOL Take 650 mg by mouth every 8 (eight) hours as needed for pain (or back pain or migraines).   Cartia  XT 180 MG 24 hr capsule Generic drug: diltiazem Take 1 capsule (180 mg total) by mouth daily.   Eliquis 5 MG Tabs tablet Generic drug: apixaban Take 1 tablet (5 mg total) by mouth 2 (two) times daily.   lactulose 10 GM/15ML solution Commonly known as: CHRONULAC Take 15 mL up to 3 times daily, goal is to have 2-3 bowel movements daily.   LORazepam 1 MG tablet Commonly known as: ATIVAN TAKE 1 TABLET BY MOUTH EACH NIGHT AT BEDTIME FOR SLEEP What changed: See the new instructions.   losartan 25 MG tablet Commonly known as: Cozaar Take 1 tablet (25 mg total) by mouth daily.   methocarbamol 500 MG tablet Commonly known as: ROBAXIN TAKE 1 TABLET BY MOUTH EVERY 6 HOURS AS NEEDED FOR MUSCLE SPASMS   metoprolol succinate 50 MG 24 hr tablet Commonly known as: Toprol XL Take 1 tablet (50 mg total) by mouth daily. Take with or immediately following a meal.   multivitamin capsule Take 1 capsule by mouth daily.   pantoprazole 40 MG tablet Commonly known as: PROTONIX Take 1 tablet (40 mg total) by mouth daily.   psyllium 58.6 % packet Commonly known as: METAMUCIL Take 1 packet by mouth daily.   rosuvastatin 40 MG tablet Commonly known as: CRESTOR Take  1 tablet  Daily  for Cholesterol What changed:  how much to take how to take this when to take this   sertraline 100 MG tablet Commonly known as: ZOLOFT Take 1 tablet (100 mg total) by mouth in the morning and at bedtime. What changed: when to take this        Follow-up Information     [name], MD. Go on 11/13/2022.   Specialty: Internal Medicine Why: '@4'$ :00pm Contact information: [address] [place] [address] [phone]                Discharge Exam: Filed Weights   11/07/22 0525 11/07/22 0658 11/08/22 0400  Weight: 94.3 kg 94.3 kg 95.2 kg  BP (!) 144/67 (BP Location: Left Arm)   Pulse 70   Temp 97.6 F (36.4 C) (Oral)   Resp 16   Ht '6\' 1"'$  (1.854 m)   Wt 95.2 kg    SpO2 93%   BMI 27.69 kg/m   No distress, pleasant, working with PT Clear, nonlabored RRR, NSR on monitor, no MRG or pitting edema Soft, NT, ND, +BS Alert, oriented  Condition at discharge: stable  The results of significant diagnostics from this hospitalization (including imaging, microbiology, ancillary and laboratory) are listed below for reference.   Imaging Studies: US RENAL  Result Date: 11/07/2022 CLINICAL DATA:  Acute kidney injury EXAM: RENAL / URINARY TRACT ULTRASOUND COMPLETE COMPARISON:  CT 06/22/2021 FINDINGS: Right Kidney: Not visualized, atrophic on prior CT. Left Kidney: Renal measurements: 13.6 x 7.1 x 5.6 cm = volume: 289.7 mL. No hydronephrosis. Normal cortical echogenicity. Prominent column of Bertin. Bladder: Appears normal for degree of bladder distention. Other: Trace perihepatic  ascites.  Trace pleural effusions. IMPRESSION: Nonvisualization of an atrophic right kidney. No hydronephrosis of the left kidney. Trace perihepatic ascites. Trace pleural effusions. Electronically Signed   By: Maurine Simmering M.D.   On: 11/07/2022 16:23   ECHO TEE  Result Date: 11/07/2022    TRANSESOPHOGEAL ECHO REPORT   Patient Name:   Srihaan Philmore Ehab Humber. Date of Exam: 11/07/2022 Medical Rec #:  [id number]                   Height:       73.0 in Accession #:    [id number]                  Weight:       207.9 lb Date of Birth:  Nov 22, 1945                  BSA:          2.187 m Patient Age:    72 years                    BP:           114/58 mmHg Patient Gender: M                           HR:           106 bpm. Exam Location:  Inpatient Procedure: Transesophageal Echo, Cardiac Doppler and Color Doppler Indications:     atrial fibrillation  History:         Patient has prior history of Echocardiogram examinations, most                  recent 11/05/2022. Chronic kidney disease. Cirrhosis,                  Arrythmias:Atrial Fibrillation; Risk Factors:Dyslipidemia,                  Hypertension and  Sleep Apnea.  Sonographer:     Johny Chess RDCS Referring Phys:  [place] Diagnosing Phys: Dixie Dials MD PROCEDURE: After discussion of the risks and benefits of a TEE, an informed consent was obtained from the patient. The transesophogeal probe was passed without difficulty through the esophogus of the patient. Imaged were obtained with the patient in a left lateral decubitus position. Sedation performed by different physician. The patient was monitored while under deep sedation. Anesthestetic sedation was provided intravenously by Anesthesiology: '271mg'$  of Propofol. The patient developed no complications during the procedure. A direct current cardioversion was performed.  IMPRESSIONS  1. Left ventricular ejection fraction, by estimation, is 40 to 45%. The left ventricle has mildly decreased function. The left ventricle demonstrates global hypokinesis. Left ventricular diastolic parameters are indeterminate.  2. Right ventricular systolic function is mildly reduced. The right ventricular size is moderately enlarged.  3. Left atrial size was moderately dilated. No left atrial/left atrial appendage thrombus was detected.  4. Right atrial size was severely dilated. Sontaneous echo contrast.  5. The mitral valve is degenerative. Moderate mitral valve regurgitation.  6. Tricuspid valve regurgitation is moderate to severe.  7. The aortic valve is tricuspid. There is moderate calcification of the aortic valve. There is moderate thickening of the aortic valve. Aortic valve regurgitation is mild.  8. There is mild (Grade II) atheroma plaque involving the descending aorta.  9. The inferior vena cava is dilated in size with >50% respiratory variability,  suggesting right atrial pressure of 8 mmHg. 10. Cannot exclude a small PFO. Agitated saline contrast bubble study was positive with shunting observed within 3-6 cardiac cycles suggestive of interatrial shunt. There is a small patent foramen ovale with  predominantly right to left shunting across the atrial septum. FINDINGS  Left Ventricle: Left ventricular ejection fraction, by estimation, is 40 to 45%. The left ventricle has mildly decreased function. The left ventricle demonstrates global hypokinesis. The left ventricular internal cavity size was normal in size. There is  no left ventricular hypertrophy. Left ventricular diastolic parameters are indeterminate. Right Ventricle: The right ventricular size is moderately enlarged. No increase in right ventricular wall thickness. Right ventricular systolic function is mildly reduced. Left Atrium: Left atrial size was moderately dilated. No left atrial/left atrial appendage thrombus was detected. Right Atrium: Right atrial size was severely dilated. Sontaneous echo contrast. Pericardium: There is no evidence of pericardial effusion. Mitral Valve: The mitral valve is degenerative in appearance. Moderate mitral valve regurgitation. Tricuspid Valve: The tricuspid valve is normal in structure. Tricuspid valve regurgitation is moderate to severe. Aortic Valve: The aortic valve is tricuspid. There is moderate calcification of the aortic valve. There is moderate thickening of the aortic valve. There is mild aortic valve annular calcification. Aortic valve regurgitation is mild. Pulmonic Valve: The pulmonic valve was normal in structure. Pulmonic valve regurgitation is trivial. Aorta: The aortic root is normal in size and structure. There is mild (Grade II) atheroma plaque involving the descending aorta. Venous: The inferior vena cava is dilated in size with greater than 50% respiratory variability, suggesting right atrial pressure of 8 mmHg. IAS/Shunts: Cannot exclude a small PFO. Agitated saline contrast was given intravenously to evaluate for intracardiac shunting. Agitated saline contrast bubble study was positive with shunting observed within 3-6 cardiac cycles suggestive of interatrial shunt. A small patent foramen  ovale is detected with predominantly right to left shunting across the atrial septum.  TRICUSPID VALVE TR Peak grad:   13.7 mmHg TR Vmax:        185.00 cm/s Dixie Dials MD Electronically signed by Dixie Dials MD Signature Date/Time: 11/07/2022/3:05:01 PM    Final    ECHOCARDIOGRAM COMPLETE  Result Date: 11/05/2022    ECHOCARDIOGRAM REPORT   Patient Name:   Kaitlyn Philmore Dominyk Law. Date of Exam: 11/05/2022 Medical Rec #:  [id number]                   Height:       73.0 in Accession #:    [id number]                  Weight:       206.8 lb Date of Birth:  04/23/1946                  BSA:          2.182 m Patient Age:    22 years                    BP:           103/88 mmHg Patient Gender: M                           HR:           112 bpm. Exam Location:  Inpatient Procedure: 2D Echo, Cardiac Doppler and Color Doppler Indications:    R94.31 Abnormal EKG  History:  Patient has no prior history of Echocardiogram examinations.                 Risk Factors:Hypertension and Dyslipidemia.  Sonographer:    Phineas Douglas Referring Phys: [id number] [place]  1. Left ventricular ejection fraction, by estimation, is 45 to 50%. The left ventricle has mildly decreased function. The left ventricle demonstrates global hypokinesis. There is mild concentric left ventricular hypertrophy. Left ventricular diastolic parameters are indeterminate.  2. Right ventricular systolic function is normal. The right ventricular size is normal. There is normal pulmonary artery systolic pressure.  3. Right atrial size was severely dilated.  4. There is no evidence of cardiac tamponade.  5. The mitral valve is normal in structure. Trivial mitral valve regurgitation. No evidence of mitral stenosis.  6. The aortic valve is tricuspid. Aortic valve regurgitation is not visualized. No aortic stenosis is present.  7. The inferior vena cava is dilated in size with <50% respiratory variability, suggesting right atrial pressure of 15  mmHg. FINDINGS  Left Ventricle: Left ventricular ejection fraction, by estimation, is 45 to 50%. The left ventricle has mildly decreased function. The left ventricle demonstrates global hypokinesis. The left ventricular internal cavity size was normal in size. There is  mild concentric left ventricular hypertrophy. Left ventricular diastolic parameters are indeterminate. Indeterminate filling pressures. Right Ventricle: The right ventricular size is normal. No increase in right ventricular wall thickness. Right ventricular systolic function is normal. There is normal pulmonary artery systolic pressure. The tricuspid regurgitant velocity is 2.05 m/s, and  with an assumed right atrial pressure of 15 mmHg, the estimated right ventricular systolic pressure is 10.6 mmHg. Left Atrium: Left atrial size was normal in size. Right Atrium: Right atrial size was severely dilated. Pericardium: Trivial pericardial effusion is present. There is no evidence of cardiac tamponade. Mitral Valve: The mitral valve is normal in structure. Trivial mitral valve regurgitation. No evidence of mitral valve stenosis. Tricuspid Valve: The tricuspid valve is normal in structure. Tricuspid valve regurgitation is mild . No evidence of tricuspid stenosis. Aortic Valve: The aortic valve is tricuspid. Aortic valve regurgitation is not visualized. No aortic stenosis is present. Pulmonic Valve: The pulmonic valve was normal in structure. Pulmonic valve regurgitation is not visualized. No evidence of pulmonic stenosis. Aorta: The aortic root is normal in size and structure. Venous: The inferior vena cava is dilated in size with less than 50% respiratory variability, suggesting right atrial pressure of 15 mmHg. IAS/Shunts: No atrial level shunt detected by color flow Doppler.  LEFT VENTRICLE PLAX 2D LVIDd:         4.30 cm      Diastology LVIDs:         3.30 cm      LV e' medial:    9.25 cm/s LV PW:         1.10 cm      LV E/e' medial:  11.2 LV IVS:         1.10 cm      LV e' lateral:   10.80 cm/s LVOT diam:     2.10 cm      LV E/e' lateral: 9.6 LV SV:         50 LV SV Index:   23 LVOT Area:     3.46 cm  LV Volumes (MOD) LV vol d, MOD A2C: 116.0 ml LV vol d, MOD A4C: 103.0 ml LV vol s, MOD A2C: 62.2 ml LV vol s, MOD A4C: 57.9 ml  LV SV MOD A2C:     53.8 ml LV SV MOD A4C:     103.0 ml LV SV MOD BP:      55.7 ml RIGHT VENTRICLE             IVC RV Basal diam:  4.80 cm     IVC diam: 2.60 cm RV S prime:     12.10 cm/s TAPSE (M-mode): 1.9 cm LEFT ATRIUM             Index        RIGHT ATRIUM           Index LA diam:        4.40 cm 2.02 cm/m   RA Area:     28.90 cm LA Vol (A2C):   55.8 ml 25.57 ml/m  RA Volume:   102.00 ml 46.74 ml/m LA Vol (A4C):   50.0 ml 22.91 ml/m LA Biplane Vol: 53.9 ml 24.70 ml/m  AORTIC VALVE LVOT Vmax:   79.40 cm/s LVOT Vmean:  54.400 cm/s LVOT VTI:    0.143 m  AORTA Ao Root diam: 3.20 cm Ao Asc diam:  2.90 cm MITRAL VALVE                TRICUSPID VALVE MV Area (PHT): 3.93 cm     TR Peak grad:   16.8 mmHg MV Decel Time: 193 msec     TR Vmax:        205.00 cm/s MV E velocity: 104.00 cm/s                             SHUNTS                             Systemic VTI:  0.14 m                             Systemic Diam: 2.10 cm Skeet Latch MD Electronically signed by Skeet Latch MD Signature Date/Time: 11/05/2022/12:27:50 PM    Final    DG Chest 1 View  Result Date: 11/04/2022 CLINICAL DATA:  Congestive failure EXAM: PORTABLE CHEST 1 VIEW COMPARISON:  11/04/2022 FINDINGS: Cardiac shadow is mildly prominent. Mild central vascular congestion is seen without edema. No focal infiltrate is noted. No bony abnormality is seen. IMPRESSION: Mild congestive failure. Electronically Signed   By: Inez Catalina M.D.   On: 11/04/2022 20:05   DG Chest Portable 1 View  Result Date: 11/04/2022 CLINICAL DATA:  Shortness of breath EXAM: PORTABLE CHEST 1 VIEW COMPARISON:  None Available. FINDINGS: No pleural effusion. No pneumothorax. No focal airspace  opacity. Unchanged cardiac and mediastinal contours. Visualized upper abdomen is unremarkable. No displaced rib fractures. IMPRESSION: No focal airspace opacity. Electronically Signed   By: Marin Roberts M.D.   On: 11/04/2022 14:59   US Abdomen Limited RUQ (LIVER/GB)  Result Date: 10/16/2022 CLINICAL DATA:  elevated LFT's; thrombocytopenia EXAM: ULTRASOUND ABDOMEN LIMITED RIGHT UPPER QUADRANT COMPARISON:  April 01, 2022 FINDINGS: Gallbladder: No wall thickening visualized. Biliary sludge. Previously described cholelithiasis are not visualized on current exam. No sonographic Murphy sign noted by sonographer. Common bile duct: Diameter: Visualized portion measures 3 mm, within normal limits Liver: No focal lesion identified. Heterogeneous parenchymal echogenicity with nodular liver contours. Portal vein is grossly patent on color Doppler imaging with normal direction of blood flow towards the  liver. Other: None. IMPRESSION: 1. Cirrhotic liver morphology. 2. Biliary sludge without evidence of acute cholecystitis. Electronically Signed   By: Valentino Saxon M.D.   On: 10/16/2022 07:47    Microbiology: Results for orders placed or performed during the hospital encounter of 06/22/21  Resp Panel by RT-PCR (Flu A&B, Covid) Nasopharyngeal Swab     Status: None   Collection Time: 06/22/21  3:56 PM   Specimen: Nasopharyngeal Swab; Nasopharyngeal(NP) swabs in vial transport medium  Result Value Ref Range Status   SARS Coronavirus 2 by RT PCR NEGATIVE NEGATIVE Final    Comment: (NOTE) SARS-CoV-2 target nucleic acids are NOT DETECTED.  The SARS-CoV-2 RNA is generally detectable in upper respiratory specimens during the acute phase of infection. The lowest concentration of SARS-CoV-2 viral copies this assay can detect is 138 copies/mL. A negative result does not preclude SARS-Cov-2 infection and should not be used as the sole basis for treatment or other patient management decisions. A negative result may  occur with  improper specimen collection/handling, submission of specimen other than nasopharyngeal swab, presence of viral mutation(s) within the areas targeted by this assay, and inadequate number of viral copies(<138 copies/mL). A negative result must be combined with clinical observations, patient history, and epidemiological information. The expected result is Negative.  Fact Sheet for Patients:  EntrepreneurPulse.com.au  Fact Sheet for Healthcare Providers:  IncredibleEmployment.be  This test is no t yet approved or cleared by the [place] FDA and  has been authorized for detection and/or diagnosis of SARS-CoV-2 by FDA under an Emergency Use Authorization (EUA). This EUA will remain  in effect (meaning this test can be used) for the duration of the COVID-19 declaration under Section 564(b)(1) of the Act, 21 U.S.C.section 360bbb-3(b)(1), unless the authorization is terminated  or revoked sooner.       Influenza A by PCR NEGATIVE NEGATIVE Final   Influenza B by PCR NEGATIVE NEGATIVE Final    Comment: (NOTE) The Xpert Xpress SARS-CoV-2/FLU/RSV plus assay is intended as an aid in the diagnosis of influenza from Nasopharyngeal swab specimens and should not be used as a sole basis for treatment. Nasal washings and aspirates are unacceptable for Xpert Xpress SARS-CoV-2/FLU/RSV testing.  Fact Sheet for Patients: EntrepreneurPulse.com.au  Fact Sheet for Healthcare Providers: IncredibleEmployment.be  This test is not yet approved or cleared by the [place] FDA and has been authorized for detection and/or diagnosis of SARS-CoV-2 by FDA under an Emergency Use Authorization (EUA). This EUA will remain in effect (meaning this test can be used) for the duration of the COVID-19 declaration under Section 564(b)(1) of the Act, 21 U.S.C. section 360bbb-3(b)(1), unless the authorization is terminated  or revoked.  Performed at [facility], [place] [address] [place], [place] [address]   SARS CORONAVIRUS 2 (TAT 6-24 HRS) Nasopharyngeal Nasopharyngeal Swab     Status: None   Collection Time: 06/26/21  1:35 PM   Specimen: Nasopharyngeal Swab  Result Value Ref Range Status   SARS Coronavirus 2 NEGATIVE NEGATIVE Final    Comment: (NOTE) SARS-CoV-2 target nucleic acids are NOT DETECTED.  The SARS-CoV-2 RNA is generally detectable in upper and lower respiratory specimens during the acute phase of infection. Negative results do not preclude SARS-CoV-2 infection, do not rule out co-infections with other pathogens, and should not be used as the sole basis for treatment or other patient management decisions. Negative results must be combined with clinical observations, patient history, and epidemiological information. The expected result is Negative.  Fact Sheet for  Patients: SugarRoll.be  Fact Sheet for Healthcare Providers: https://www.woods-mathews.com/  This test is not yet approved or cleared by the [place] FDA and  has been authorized for detection and/or diagnosis of SARS-CoV-2 by FDA under an Emergency Use Authorization (EUA). This EUA will remain  in effect (meaning this test can be used) for the duration of the COVID-19 declaration under Se ction 564(b)(1) of the Act, 21 U.S.C. section 360bbb-3(b)(1), unless the authorization is terminated or revoked sooner.  Performed at [facility], [place] [address], [place], [place] [address]     Labs: CBC: Recent Labs  Lab 11/04/22 0000 11/04/22 1348 11/05/22 0418 11/06/22 0051 11/07/22 0140 11/08/22 0053  WBC 5.0 5.5 6.9 6.9 6.6 5.3  NEUTROABS 2,495  --   --   --   --   --   HGB 13.9 13.8 13.5 13.5 13.2 12.4*  HCT 40.6 40.7 40.0 39.0 37.9* 36.2*  MCV 92.7 93.3 93.5 93.1 91.8 91.6  PLT 121* 116* 131* 122* 116* 035*   Basic Metabolic Panel: Recent Labs  Lab  11/04/22 1348 11/04/22 1734 11/05/22 0418 11/06/22 0051 11/07/22 0140 11/08/22 0053  NA 139  --  139 136 134* 135  K 4.0  --  3.8 4.0 3.9 3.6  CL 109  --  106 103 106 109  CO2 22  --  '22 24 22 '$ 20*  GLUCOSE 79  --  106* 114* 110* 102*  BUN 7*  --  '11 10 15 15  '$ CREATININE 1.50*  --  1.85* 1.90* 2.22* 1.92*  CALCIUM 8.9  --  8.9 8.8* 8.8* 8.8*  MG  --  2.0  --  2.1 2.2 2.0   Liver Function Tests: Recent Labs  Lab 11/04/22 0000  AST 42*  ALT 22  BILITOT 1.7*  PROT 6.4   CBG: No results for input(s): "GLUCAP" in the last 168 hours.  Discharge time spent: greater than 30 minutes.  Signed: Patrecia Pour, MD [facility] 11/08/2022

## 2022-11-07 NOTE — Progress Notes (Signed)
[facility] PROGRESS NOTE  Cody Hines. (DOB: 1946/06/05) CHE:[id number] PCP: [name], MD  Brief Narrative: Cody Hines. is a 77 y.o. male with medical history significant of liver cirrhosis, HTN, HLD, CKD stage II, OSA, sent from PCPs office for evaluation of rapid atrial fibrillation. He complained of chest pain and was sent to the ED where he was admitted with diltiazem and heparin infusions. Troponins negative. Cardiology consulted, recommending TEE-DCCV which the patient underwent 2/2 by Dr. Doylene Canard with restoration of sinus rhythm. Having tolerated GDMT and rate control medications, he was cleared for discharge by the cardiology team. He remains groggy several hours after the procedure and renal function has continued to decline. Work up underway, will require additional day as inpatient.  Subjective: "Can't get awake" since procedure, but has no chest pain or other complaints, "it all went better than I expected."   Objective: BP 130/66   Pulse (!) 57   Temp (!) 97.5 F (36.4 C) (Tympanic)   Resp 11   Ht '6\' 1"'$  (1.854 m)   Wt 94.3 kg   SpO2 99%   BMI 27.43 kg/m   Gen: No distress, sleeping soundly with SpO2 into 88% range, snoring without CPAP.  Pulm: Clear, nonlabored. SpO2 into mid 90%'s once awakened. CV: Regular borderline bradycardia, NSR on monitor GI: Soft, NT, ND, +BS  Neuro: Drowsy but fully rousable and interactive, no deficits in strength or sensation. Ext: Warm, no deformities. Skin: No rashes, lesions or ulcers on visualized skin   Assessment & Plan: AFib with RVR: New diagnosis. TSH 2.068 - Rate remained uncontrolled, further titration was limited by hypotension, underwent successful TEE-DCCV 2/2. Plan to continue dilt/metop per cardiology, Dr. Terrence Dupont.  - Eliquis, cardizem, metoprolol sent to [facility] pharmacy to have on hand for discharge. DC aspirin. Give PPI given risk of GI bleeding as well.    Chronic HFrEF: Newly  diagnosed, not acutely overloaded. LVEF 45-50%, global hypokinesis, mild LVH, normal size/function of RV, nl PASP. Severe RAE. IVC is dilated and blunted. Likely plethoric due to rapid AFib at time of exam.  - Once renal function known to stabilized, will proceed with cardiology plan of starting ARB (replaces home ACEi), metop succinate.  - Monitor for tolerance, follow up with cardiology after DC.  Hepatic cirrhosis, portal HTN: Hx hepatic encephalopathy (none currently).  - Continue lactulose. Pt has some drowsiness which is most likely simply related to procedural sedation. BUN is only 15. He has no asterixis or focal deficits. If this fails to improve, would check ammonia among other work up. - Thrombocytopenia, stable. INR is 1.4-1.5. Albumin 3.6. No ascites.  - Continue PPI given initiation of anticoagulation  AKI on stage IIIa CKD: SCr has continued upward trend. Continue to suspect it's hemodynamically mediated from rapid atrial fibrillation and will improve with restoration of NSR. Urinalysis bland with hyaline casts and 30 of protein on dipstick (0.12 spot Pr:Cr). Check renal U/S - Avoid nephrotoxins.  - Monitor in AM.  HTN:  - Hold BP meds  HLD: LDL is 60.  - Continue rosuvastatin  OSA:  - CPAP qHS  Depression:  - Continue SSRI  IBS: Reports loose stools, though between IBS and lactulose, with benign abdominal exam, no leukocytosis, no further work up is currently planned.   Patrecia Pour, MD [facility] [email or web address] 11/07/2022, 12:29 PM

## 2022-11-07 NOTE — Interval H&P Note (Signed)
History and Physical Interval Note:  11/07/2022 7:32 AM  Cody Hines.  has presented today for surgery, with the diagnosis of afib.  The various methods of treatment have been discussed with the patient and family. After consideration of risks, benefits and other options for treatment, the patient has consented to  Procedure(s): TRANSESOPHAGEAL ECHOCARDIOGRAM (TEE) (N/A) CARDIOVERSION (N/A) as a surgical intervention.  The patient's history has been reviewed, patient examined, no change in status, stable for surgery.  I have reviewed the patient's chart and labs.  Questions were answered to the patient's satisfaction.     Birdie Riddle

## 2022-11-08 DIAGNOSIS — I480 Paroxysmal atrial fibrillation: Secondary | ICD-10-CM | POA: Diagnosis not present

## 2022-11-08 LAB — BASIC METABOLIC PANEL
Anion gap: 6 (ref 5–15)
BUN: 15 mg/dL (ref 8–23)
CO2: 20 mmol/L — ABNORMAL LOW (ref 22–32)
Calcium: 8.8 mg/dL — ABNORMAL LOW (ref 8.9–10.3)
Chloride: 109 mmol/L (ref 98–111)
Creatinine, Ser: 1.92 mg/dL — ABNORMAL HIGH (ref 0.61–1.24)
GFR, Estimated: 36 mL/min — ABNORMAL LOW (ref >60)
Glucose, Bld: 102 mg/dL — ABNORMAL HIGH (ref 70–99)
Potassium: 3.6 mmol/L (ref 3.5–5.1)
Sodium: 135 mmol/L (ref 135–145)

## 2022-11-08 LAB — MAGNESIUM: Magnesium: 2 mg/dL (ref 1.7–2.4)

## 2022-11-08 LAB — CBC
HCT: 36.2 % — ABNORMAL LOW (ref 39.0–52.0)
Hemoglobin: 12.4 g/dL — ABNORMAL LOW (ref 13.0–17.0)
MCH: 31.4 pg (ref 26.0–34.0)
MCHC: 34.3 g/dL (ref 30.0–36.0)
MCV: 91.6 fL (ref 80.0–100.0)
Platelets: 110 10*3/uL — ABNORMAL LOW (ref 150–400)
RBC: 3.95 MIL/uL — ABNORMAL LOW (ref 4.22–5.81)
RDW: 14.6 % (ref 11.5–15.5)
WBC: 5.3 10*3/uL (ref 4.0–10.5)
nRBC: 0.4 % — ABNORMAL HIGH (ref 0.0–0.2)

## 2022-11-08 NOTE — Evaluation (Signed)
Occupational Therapy Evaluation Patient Details Name: Cody Hines. MRN: [id number] DOB: [birth year] Today's Date: 11/08/2022   History of Present Illness 77 y.o. male admitted 1/30 from PCPs office for evaluation of rapid atrial fibrillation. PMHx: liver cirrhosis, HTN, HLD, CKD stage II, OSA,   Clinical Impression   Pt. Was able to participate in OT evaluation. Pt. States his nose was bleeding but it had stoopped and OT had patient wash the blood off of his face. Pt. Was SOB during evaluaton but vitals were WNL. Pt. Requested pain medicine and nurse came into room with medicine. This therapist informed nurse of nose bleed and that pt. Did seem SOB. Pt. States he does have family that can assist as needed. Pt. Is supposed to dc home today and recommend that his family assist at home until he regains strength. Pt. States they can assist with his care.      Recommendations for follow up therapy are one component of a multi-disciplinary discharge planning process, led by the attending physician.  Recommendations may be updated based on patient status, additional functional criteria and insurance authorization.   Follow Up Recommendations  Home health OT     Assistance Recommended at Discharge Intermittent Supervision/Assistance  Patient can return home with the following A little help with walking and/or transfers;A little help with bathing/dressing/bathroom;Assistance with cooking/housework;Help with stairs or ramp for entrance    Functional Status Assessment  Patient has had a recent decline in their functional status and demonstrates the ability to make significant improvements in function in a reasonable and predictable amount of time.  Equipment Recommendations  None recommended by OT    Recommendations for Other Services       Precautions / Restrictions Precautions Precautions: None Precaution Comments: Monitor HR and BP Restrictions Weight Bearing Restrictions: No       Mobility Bed Mobility                    Transfers Overall transfer level: Needs assistance Equipment used: Rolling walker (2 wheels) Transfers: Sit to/from Stand Sit to Stand: Min guard           General transfer comment: Close guard for safety. Very slow and effortful to rise. Cues for technique. RW provided for support upon standing.      Balance Overall balance assessment: Mild deficits observed, not formally tested                                         ADL either performed or assessed with clinical judgement   ADL Overall ADL's : Needs assistance/impaired Eating/Feeding: Independent   Grooming: Wash/dry hands;Wash/dry face;Supervision/safety;Standing   Upper Body Bathing: Supervision/ safety;Set up;Sitting   Lower Body Bathing: Min guard;Sit to/from stand   Upper Body Dressing : Set up;Sitting   Lower Body Dressing: Min guard;Sit to/from stand   Toilet Transfer: Min guard;Ambulation   Toileting- Clothing Manipulation and Hygiene: Min guard;Sit to/from stand       Functional mobility during ADLs: Min guard General ADL Comments: Pt. will need assit initally.     Vision Baseline Vision/History: 1 Wears glasses (hx of cataract sx) Ability to See in Adequate Light: 0 Adequate Patient Visual Report: No change from baseline Vision Assessment?: No apparent visual deficits     Perception     Praxis      Pertinent Vitals/Pain Pain Assessment Pain Assessment: 0-10 Pain Score:  5  Pain Location: back Pain Descriptors / Indicators: Dull Pain Intervention(s): Patient requesting pain meds-RN notified     Hand Dominance Right   Extremity/Trunk Assessment Upper Extremity Assessment Upper Extremity Assessment: Overall WFL for tasks assessed   Lower Extremity Assessment Lower Extremity Assessment: Defer to PT evaluation       Communication Communication Communication: No difficulties   Cognition Arousal/Alertness:  Awake/alert Behavior During Therapy: WFL for tasks assessed/performed Overall Cognitive Status: Within Functional Limits for tasks assessed                                       General Comments  BP after amb 141/70    Exercises     Shoulder Instructions      Home Living Family/patient expects to be discharged to:: Private residence Living Arrangements: Alone   Type of Home: House Home Access: Ramped entrance     Home Layout: Multi-level Alternate Level Stairs-Number of Steps: 7 (split level) Alternate Level Stairs-Rails: Left;Right Bathroom Shower/Tub: Occupational psychologist: Standard     Home Equipment: Radio producer - single point;BSC/3in1;Shower seat   Additional Comments: Pt. states he has family and friends he can call if he needs assist.      Prior Functioning/Environment Prior Level of Function : Independent/Modified Independent;Driving             Mobility Comments: active, drives, no AD needed PTA ADLs Comments: i to mod i        OT Problem List: Decreased strength;Decreased activity tolerance      OT Treatment/Interventions: Self-care/ADL training;DME and/or AE instruction;Therapeutic activities    OT Goals(Current goals can be found in the care plan section) Acute Rehab OT Goals Patient Stated Goal: go home today OT Goal Formulation: With patient  OT Frequency:      Co-evaluation              AM-PAC OT "6 Clicks" Daily Activity     Outcome Measure Help from another person eating meals?: None Help from another person taking care of personal grooming?: A Little Help from another person toileting, which includes using toliet, bedpan, or urinal?: A Little Help from another person bathing (including washing, rinsing, drying)?: A Little Help from another person to put on and taking off regular upper body clothing?: A Little Help from another person to put on and taking off regular lower body clothing?: A Little 6 Click  Score: 19   End of Session Equipment Utilized During Treatment: Rolling walker (2 wheels) Nurse Communication:  (ok therapy)  Activity Tolerance: Patient tolerated treatment well Patient left: in chair;with chair alarm set;with nursing/sitter in room  OT Visit Diagnosis: Unsteadiness on feet (R26.81)                Time: 8937-3428 OT Time Calculation (min): 28 min Charges:  OT General Charges $OT Visit: 1 Visit OT Evaluation $OT Eval Moderate Complexity: 1 Mod  [place] OT/L   Cody Hines 11/08/2022, 9:46 AM

## 2022-11-08 NOTE — TOC Transition Note (Signed)
Transition of Care [facility]) - CM/SW Discharge Note   Patient Details  Name: Cody Hines. MRN: [id number] Date of Birth: 12/31/1945  Transition of Care [facility]) CM/SW Contact:  Carles Collet, RN Phone Number: 11/08/2022, 10:20 AM   Clinical Narrative:     Attempted to reach patient on room number and mobile, unsuccessful. Spoke w his son Cody Hines to set up [facility] services. No preferences for agency, but Bebe Shaggy requested to be point of contact.  Referral accepted by [facility] and liaison provided with son's number to make appointments through. Patient has needed DME at home per [facility].  Cody Hines will provide transportation home later today. No other TOC needs for DC.    Hines,Cody (Son) [phone]   Final next level of care: Home w Home Health Services Barriers to Discharge: No Barriers Identified   Patient Goals and CMS Choice CMS Medicare.gov Compare Post Acute Care list provided to:: Other (Comment Required) Choice offered to / list presented to : Adult Children  Discharge Placement                         Discharge Plan and Services Additional resources added to the After Visit Summary for                            [facility] Arranged: PT, OT [facility] Agency: [place] Date [facility] Agency Contacted: 11/08/22 Time [place]: 31 Representative spoke with at [place]: [place] Determinants of Health ([place]) Interventions SDOH Screenings   Food Insecurity: No Food Insecurity (11/05/2022)  Housing: Low Risk  (11/05/2022)  Transportation Needs: No Transportation Needs (11/05/2022)  Utilities: Not At Risk (11/05/2022)  Depression (PHQ2-9): Low Risk  (11/04/2022)  Tobacco Use: Low Risk  (11/07/2022)     Readmission Risk Interventions     No data to display

## 2022-11-08 NOTE — Progress Notes (Signed)
Cody Hines Cody Hines. to be D/C'd home per MD order. Discussed with the patient and son Alroy Dust and all questions fully answered. Medications delivered to bedside. Patient's son stated they will purchase a walker on the way home. Skin clean and dry.  IV catheter discontinued intact. Site without signs and symptoms of complications. Dressing and pressure applied.  An After Visit Summary was printed and given to the patient.  Patient escorted via [place], and [place] home via private auto.  Melonie Florida  11/08/2022 2:43 PM

## 2022-11-08 NOTE — Progress Notes (Addendum)
Physical Therapy Treatment Patient Details Name: Cody Hines. MRN: [id number] DOB: Nov 13, 1945 Today's Date: 11/08/2022   History of Present Illness 77 y.o. male admitted 1/30 from PCPs office for evaluation of rapid atrial fibrillation. Pt underwent cardioversion 11/07/22. PMHx: liver cirrhosis, HTN, HLD, CKD stage II, OSA,    PT Comments    Pt received in bed, soundly sleeping. Mod I supine to sit. Pt sat EOB for several minutes to wake up and clear. Min guard assist sit to stand and min guard assist ambulation 150' with RW. Pt in recliner for breakfast at end of session. Pt reports family (sisters, son, nieces) able to provide needed level of assist at home. He has a ramped entry to accommodate new use of RW.     Recommendations for follow up therapy are one component of a multi-disciplinary discharge planning process, led by the attending physician.  Recommendations may be updated based on patient status, additional functional criteria and insurance authorization.  Follow Up Recommendations  Home health PT     Assistance Recommended at Discharge PRN  Patient can return home with the following A little help with walking and/or transfers;Assistance with cooking/housework;A little help with bathing/dressing/bathroom;Assist for transportation;Help with stairs or ramp for entrance   Equipment Recommendations  Rolling walker (2 wheels)    Recommendations for Other Services       Precautions / Restrictions Precautions Precautions: Fall;Other (comment) Precaution Comments: Monitor HR and BP     Mobility  Bed Mobility Overal bed mobility: Modified Independent       Supine to sit: Modified independent (Device/Increase time)     General bed mobility comments: +rail, increased time and effort    Transfers Overall transfer level: Needs assistance Equipment used: Rolling walker (2 wheels) Transfers: Sit to/from Stand Sit to Stand: Min guard           General  transfer comment: increased time and effort to power up    Ambulation/Gait Ambulation/Gait assistance: Min guard Gait Distance (Feet): 150 Feet Assistive device: Rolling walker (2 wheels) Gait Pattern/deviations: Step-through pattern, Decreased stride length, Trunk flexed Gait velocity: decreased Gait velocity interpretation: <1.8 ft/sec, indicate of risk for recurrent falls   General Gait Details: Steady gait with RW. HR in 70s   Stairs             Wheelchair Mobility    Modified Rankin (Stroke Patients Only)       Balance Overall balance assessment: Mild deficits observed, not formally tested                                          Cognition Arousal/Alertness: Awake/alert Behavior During Therapy: WFL for tasks assessed/performed Overall Cognitive Status: Within Functional Limits for tasks assessed                                 General Comments: Awoken from deep sleep this AM. Initially groggy but cleared appropriately.        Exercises      General Comments General comments (skin integrity, edema, etc.): BP after amb 141/70      Pertinent Vitals/Pain Pain Assessment Pain Assessment: Faces Faces Pain Scale: Hurts a little bit Pain Location: back - chronic Pain Descriptors / Indicators: Discomfort Pain Intervention(s): Monitored during session, Repositioned    Home Living  Prior Function            PT Goals (current goals can now be found in the care plan section) Acute Rehab PT Goals Patient Stated Goal: home to his dog Progress towards PT goals: Progressing toward goals    Frequency    Min 3X/week      PT Plan Current plan remains appropriate    Co-evaluation              AM-PAC PT "6 Clicks" Mobility   Outcome Measure  Help needed turning from your back to your side while in a flat bed without using bedrails?: None Help needed moving from lying on your  back to sitting on the side of a flat bed without using bedrails?: None Help needed moving to and from a bed to a chair (including a wheelchair)?: A Little Help needed standing up from a chair using your arms (e.g., wheelchair or bedside chair)?: A Little Help needed to walk in hospital room?: A Little Help needed climbing 3-5 steps with a railing? : A Little 6 Click Score: 20    End of Session Equipment Utilized During Treatment: Gait belt Activity Tolerance: Patient tolerated treatment well Patient left: in chair;with call bell/phone within reach;with chair alarm set Nurse Communication: Mobility status PT Visit Diagnosis: Unsteadiness on feet (R26.81);Other abnormalities of gait and mobility (R26.89);Muscle weakness (generalized) (M62.81)     Time: 2683-4196 PT Time Calculation (min) (ACUTE ONLY): 28 min  Charges:  $Gait Training: 23-37 mins                     Gloriann Loan., PT  Office # [phone]    Lorriane Shire 11/08/2022, 8:46 AM

## 2022-11-08 NOTE — Consult Note (Signed)
Ref: [name], MD   Subjective:  No chest pain or palpitation. Monitor shows sinus rhythm. Undergoing PT/OT evaluation. Tolerating Eliquis.  Objective:  Vital Signs in the last 24 hours: Temp:  [97.3 F (36.3 C)-98.2 F (36.8 C)] 97.6 F (36.4 C) (02/03 0400) Pulse Rate:  [34-128] 70 (02/03 0600) Cardiac Rhythm: Sinus bradycardia (02/02 2000) Resp:  [11-25] 16 (02/03 0400) BP: (104-149)/(47-72) 144/67 (02/03 0400) SpO2:  [81 %-99 %] 93 % (02/03 0400) Weight:  [95.2 kg] 95.2 kg (02/03 0400)  Physical Exam: BP Readings from Last 1 Encounters:  11/08/22 (!) 144/67     Wt Readings from Last 1 Encounters:  11/08/22 95.2 kg    Weight change: 0.862 kg Body mass index is 27.69 kg/m. HEENT: [place]/AT, Eyes-Blue, Conjunctiva-Pink, Sclera-Non-icteric Neck: No JVD, No bruit, Trachea midline. Lungs:  Clear, Bilateral. Cardiac:  Regular rhythm, normal S1 and S2, no S3. II/VI systolic murmur. Abdomen:  Soft, non-tender. BS present. Extremities:  No edema present. No cyanosis. No clubbing. CNS: AxOx3, Cranial nerves grossly intact, moves all 4 extremities.  Skin: Warm and dry.   Intake/Output from previous day: 02/02 0701 - 02/03 0700 In: 915.3 [P.O.:240; I.V.:675.3] Out: 1100 [Urine:1100]    Lab Results: BMET    Component Value Date/Time   NA 135 11/08/2022 0053   NA 134 (L) 11/07/2022 0140   NA 136 11/06/2022 0051   K 3.6 11/08/2022 0053   K 3.9 11/07/2022 0140   K 4.0 11/06/2022 0051   CL 109 11/08/2022 0053   CL 106 11/07/2022 0140   CL 103 11/06/2022 0051   CO2 20 (L) 11/08/2022 0053   CO2 22 11/07/2022 0140   CO2 24 11/06/2022 0051   GLUCOSE 102 (H) 11/08/2022 0053   GLUCOSE 110 (H) 11/07/2022 0140   GLUCOSE 114 (H) 11/06/2022 0051   BUN 15 11/08/2022 0053   BUN 15 11/07/2022 0140   BUN 10 11/06/2022 0051   CREATININE 1.92 (H) 11/08/2022 0053   CREATININE 2.22 (H) 11/07/2022 0140   CREATININE 1.90 (H) 11/06/2022 0051   CREATININE 1.54 (H) 11/04/2022  0000   CREATININE 1.49 (H) 07/24/2022 0000   CREATININE 1.49 (H) 01/09/2022 1108   CALCIUM 8.8 (L) 11/08/2022 0053   CALCIUM 8.8 (L) 11/07/2022 0140   CALCIUM 8.8 (L) 11/06/2022 0051   GFRNONAA 36 (L) 11/08/2022 0053   GFRNONAA 30 (L) 11/07/2022 0140   GFRNONAA 37 (L) 11/06/2022 0051   GFRNONAA 49 (L) 01/09/2021 0959   GFRNONAA 48 (L) 07/12/2020 1101   GFRNONAA 50 (L) 04/05/2020 1015   GFRAA 57 (L) 01/09/2021 0959   GFRAA 55 (L) 07/12/2020 1101   GFRAA 58 (L) 04/05/2020 1015   CBC    Component Value Date/Time   WBC 5.3 11/08/2022 0053   RBC 3.95 (L) 11/08/2022 0053   HGB 12.4 (L) 11/08/2022 0053   HCT 36.2 (L) 11/08/2022 0053   PLT 110 (L) 11/08/2022 0053   MCV 91.6 11/08/2022 0053   MCH 31.4 11/08/2022 0053   MCHC 34.3 11/08/2022 0053   RDW 14.6 11/08/2022 0053   LYMPHSABS 1,880 11/04/2022 0000   MONOABS 0.5 03/17/2022 1140   EOSABS 150 11/04/2022 0000   BASOSABS 20 11/04/2022 0000   HEPATIC Function Panel Recent Labs    03/17/22 1140 07/24/22 0000 11/04/22 0000  PROT 6.5 6.4 6.4  ALBUMIN 3.7  --   --   AST 25 47* 42*  ALT '12 24 22  '$ ALKPHOS 81  --   --    HEMOGLOBIN  A1C Lab Results  Component Value Date   MPG 111 11/04/2022   CARDIAC ENZYMES No results found for: "CKTOTAL", "CKMB", "CKMBINDEX", "TROPONINI" BNP No results for input(s): "PROBNP" in the last 8760 hours. TSH Recent Labs    01/09/22 1108 07/24/22 0000 11/04/22 1734  TSH 1.81 2.20 2.068   CHOLESTEROL Recent Labs    01/09/22 1108 07/24/22 0000 11/04/22 0000  CHOL 137 124 125    Scheduled Meds:  apixaban  5 mg Oral BID   diltiazem  60 mg Oral QID   LORazepam  0.5 mg Oral QHS   metoprolol tartrate  25 mg Oral BID   pantoprazole  40 mg Oral Daily   rosuvastatin  40 mg Oral Daily   sertraline  100 mg Oral Daily   Continuous Infusions: PRN Meds:.acetaminophen, lactulose, methocarbamol, metoprolol tartrate  Assessment/Plan: Paroxysmal Atrial  fibrillation HTN HLD OSA IBS BPH H/O liver cirrhosis Fibromyalgia CKD, IIIb  Plan: Increase activity. May discharge home with f/u by Dr. Terrence Dupont in 1-2 weeks.   LOS: 4 days   Time spent including chart review, lab review, examination, discussion with patient :  min   Dixie Dials  MD  11/08/2022, 8:47 AM

## 2022-11-10 NOTE — Anesthesia Postprocedure Evaluation (Signed)
Anesthesia Post Note  Patient: Cody Hines.  Procedure(s) Performed: TRANSESOPHAGEAL ECHOCARDIOGRAM (TEE) CARDIOVERSION BUBBLE STUDY     Patient location during evaluation: Endoscopy Anesthesia Type: General Level of consciousness: awake and patient cooperative Pain management: pain level controlled Vital Signs Assessment: post-procedure vital signs reviewed and stable Respiratory status: spontaneous breathing, nonlabored ventilation, respiratory function stable and patient connected to nasal cannula oxygen Cardiovascular status: blood pressure returned to baseline and stable Postop Assessment: no apparent nausea or vomiting Anesthetic complications: no   No notable events documented.  Last Vitals:  Vitals:   11/08/22 0600 11/08/22 1219  BP:  (!) 111/59  Pulse: 70 66  Resp:  18  Temp:  36.6 C  SpO2:  93%    Last Pain:  Vitals:   11/08/22 1219  TempSrc: Oral  PainSc:                  Daniella Dewberry

## 2022-11-10 NOTE — Anesthesia Preprocedure Evaluation (Signed)
Anesthesia Evaluation  Patient identified by MRN, date of birth, ID band Patient awake    Reviewed: Allergy & Precautions, NPO status , Patient's Chart, lab work & pertinent test results  History of Anesthesia Complications Negative for: history of anesthetic complications  Airway Mallampati: III  TM Distance: >3 FB Neck ROM: Full    Dental  (+) Dental Advisory Given   Pulmonary shortness of breath, sleep apnea     + decreased breath sounds      Cardiovascular hypertension, Pt. on medications +CHF  + dysrhythmias Atrial Fibrillation  Rhythm:Irregular     Neuro/Psych  Neuromuscular disease    GI/Hepatic Neg liver ROS,GERD  ,,  Endo/Other    Renal/GU Renal InsufficiencyRenal diseaseLab Results      Component                Value               Date                      CREATININE               1.92 (H)            11/08/2022                Musculoskeletal  (+)  Fibromyalgia -  Abdominal   Peds  Hematology   Anesthesia Other Findings   Reproductive/Obstetrics                             Anesthesia Physical Anesthesia Plan  ASA: 3  Anesthesia Plan: MAC and General   Post-op Pain Management:    Induction: Intravenous  PONV Risk Score and Plan: 2 and Propofol infusion and Treatment may vary due to age or medical condition  Airway Management Planned: Nasal Cannula, Natural Airway and Simple Face Mask  Additional Equipment: None  Intra-op Plan:   Post-operative Plan:   Informed Consent: I have reviewed the patients History and Physical, chart, labs and discussed the procedure including the risks, benefits and alternatives for the proposed anesthesia with the patient or authorized representative who has indicated his/her understanding and acceptance.     Dental advisory given  Plan Discussed with: CRNA  Anesthesia Plan Comments:        Anesthesia Quick Evaluation

## 2022-11-12 ENCOUNTER — Encounter (HOSPITAL_COMMUNITY): Payer: Self-pay | Admitting: Cardiovascular Disease

## 2022-11-13 ENCOUNTER — Ambulatory Visit (INDEPENDENT_AMBULATORY_CARE_PROVIDER_SITE_OTHER): Payer: Medicare Other | Admitting: Internal Medicine

## 2022-11-13 ENCOUNTER — Encounter: Payer: Self-pay | Admitting: Internal Medicine

## 2022-11-13 VITALS — BP 128/70 | HR 65 | Temp 98.0°F | Resp 18 | Ht 73.0 in | Wt 210.0 lb

## 2022-11-13 DIAGNOSIS — I4891 Unspecified atrial fibrillation: Secondary | ICD-10-CM | POA: Diagnosis not present

## 2022-11-13 DIAGNOSIS — N1831 Chronic kidney disease, stage 3a: Secondary | ICD-10-CM | POA: Diagnosis not present

## 2022-11-13 DIAGNOSIS — G4733 Obstructive sleep apnea (adult) (pediatric): Secondary | ICD-10-CM

## 2022-11-13 DIAGNOSIS — Z79899 Other long term (current) drug therapy: Secondary | ICD-10-CM

## 2022-11-13 DIAGNOSIS — K7030 Alcoholic cirrhosis of liver without ascites: Secondary | ICD-10-CM

## 2022-11-13 DIAGNOSIS — I10 Essential (primary) hypertension: Secondary | ICD-10-CM

## 2022-11-13 NOTE — Progress Notes (Signed)
PATIENT: Cody Hines. DOB: 01/09/1946  REASON FOR VISIT: follow up HISTORY FROM: patient PRIMARY NEUROLOGIST: Dr. Brett Fairy  Chief Complaint  Patient presents with   Follow-up    Pt in 53 Pt here for CPAP f/u  Pt states he has not been using CPAP because he was in hospital with  AFIB Pt states hard to breathe with mask on      HISTORY OF PRESENT ILLNESS: Today 11/17/22:  Cody Philmore Eliyas Noor. is a 77 y.o. male with a history of obstructive sleep apnea on CPAP. Returns today for follow-up.  He reports that he has not been using his CPAP consistently because he was in the hospital with A-fib.  Reports that he finds it hard to breathe with the mask on.  He reports that he currently has the nasal pillows and this is the best mask that he has had.       11/14/21: Cody Hines is a 77 year old male with a history of mild cognitive disturbance and obstructive sleep apnea on CPAP.  He returns today for follow-up.  The patient states in September he flipped his lawn more on top of him.  Fractured several vertebrae in his spine.  Spent 6 weeks in rehab.  He states during this time he has not been using the CPAP consistently.  Although he reports that he is trying to improve.  Feels that his memory has remained fairly stable.  Continues to have trouble remembering where he places things.  He currently lives at home.  But he does have [employer] that help him.  His son in [place] is involved in his care.   03/28/21: Cody Hines is a 77 year old male with a history of obstructive sleep apnea on CPAP and memory disturbance.  He returns today for follow-up.  The patient had MRI of the brain that was relatively unremarkable.  Also had an MRI of the thoracic spine that showed an old compression fracture.  Overall the patient feels that his memory has remained relatively stable.  He states that he may forget where he leaves things.  Otherwise he lives at home alone.  He is able to  complete all ADLs independently.  He operates a Teacher, music without difficulty.  He does report that he had a hearing test and will need hearing aids.  He feels that his hearing affects his ability to understand questions.  He returns today for evaluation.      REVIEW OF SYSTEMS: Out of a complete 14 system review of symptoms, the patient complains only of the following symptoms, and all other reviewed systems are negative.   ESS 13  ALLERGIES: Allergies  Allergen Reactions   Asa [Aspirin] Diarrhea, Nausea Only and Other (See Comments)    History of ulcers, also   Atorvastatin Other (See Comments)    Muscle aches   Celebrex [Celecoxib] Other (See Comments)    Headaches    Codeine Nausea And Vomiting   Savella [Milnacipran Hcl] Other (See Comments)    Reaction not recalled   Viagra [Sildenafil Citrate] Other (See Comments)    Headaches   Penicillins Rash    HOME MEDICATIONS: Outpatient Medications Prior to Visit  Medication Sig Dispense Refill   acetaminophen (TYLENOL) 650 MG CR tablet Take 650 mg by mouth every 8 (eight) hours as needed for pain (or back pain or migraines).      apixaban (ELIQUIS) 5 MG TABS tablet Take 1 tablet (5 mg total) by mouth 2 (two)  times daily. 60 tablet 0   diltiazem (CARDIZEM CD) 180 MG 24 hr capsule Take 1 capsule (180 mg total) by mouth daily. 30 capsule 0   lactulose (CHRONULAC) 10 GM/15ML solution Take 15 mL up to 3 times daily, goal is to have 2-3 bowel movements daily. 1800 mL 1   LORazepam (ATIVAN) 1 MG tablet TAKE 1 TABLET BY MOUTH EACH NIGHT AT BEDTIME FOR SLEEP (Patient taking differently: Take 1 mg by mouth at bedtime.) 30 tablet 3   losartan (COZAAR) 25 MG tablet Take 1 tablet (25 mg total) by mouth daily. 30 tablet 0   methocarbamol (ROBAXIN) 500 MG tablet TAKE 1 TABLET BY MOUTH EVERY 6 HOURS AS NEEDED FOR MUSCLE SPASMS 20 tablet 0   metoprolol succinate (TOPROL XL) 50 MG 24 hr tablet Take 1 tablet (50 mg total) by mouth daily. Take  with or immediately following a meal. 30 tablet 0   Multiple Vitamin (MULTIVITAMIN) capsule Take 1 capsule by mouth daily.     pantoprazole (PROTONIX) 40 MG tablet Take 1 tablet (40 mg total) by mouth daily. 30 tablet 0   psyllium (METAMUCIL) 58.6 % packet Take 1 packet by mouth daily.     rosuvastatin (CRESTOR) 40 MG tablet Take  1 tablet  Daily  for Cholesterol (Patient taking differently: Take 40 mg by mouth daily. Take  1 tablet  Daily  for Cholesterol) 90 tablet 3   sertraline (ZOLOFT) 100 MG tablet Take 1 tablet (100 mg total) by mouth in the morning and at bedtime. (Patient taking differently: Take 100 mg by mouth at bedtime.) 180 tablet 3   No facility-administered medications prior to visit.    PAST MEDICAL HISTORY: Past Medical History:  Diagnosis Date   BPH (benign prostatic hyperplasia)    Fibromyalgia    GERD (gastroesophageal reflux disease)    Hyperlipidemia    Hypertension    Hypogonadism male    IBS (irritable bowel syndrome)    OSA (obstructive sleep apnea)     PAST SURGICAL HISTORY: Past Surgical History:  Procedure Laterality Date    nasal smr np3  1985   BUBBLE STUDY  11/07/2022   Procedure: BUBBLE STUDY;  Surgeon: Dixie Dials, MD;  Location: [facility];  Service: Cardiovascular;;   CARDIOVERSION N/A 11/07/2022   Procedure: CARDIOVERSION;  Surgeon: Dixie Dials, MD;  Location: [facility];  Service: Cardiovascular;  Laterality: N/A;   CATARACT EXTRACTION, BILATERAL Bilateral 2021   Dr. Katy Fitch, L 5/27, R 3/25   COLONOSCOPY     KNEE ARTHROSCOPY Left 1999   SKIN CANCER EXCISION  2020   nose   SPINE SURGERY  2007   L5 S 1 Disk   SPINE SURGERY  09/2019   Compression fracture stabilization by Dr. Arnoldo Morale   TEE WITHOUT CARDIOVERSION N/A 11/07/2022   Procedure: TRANSESOPHAGEAL ECHOCARDIOGRAM (TEE);  Surgeon: Dixie Dials, MD;  [facility] END[facility]COPY;  Service: Cardiovascular;  Laterality: N/A;   VASECTOMY  1983    FAMILY HISTORY: Family History  Problem  Relation Age of Onset   Cancer Mother        breast   Cancer Father        lung   Diabetes Father    Heart disease Sister    Arthritis Sister    Colon cancer Neg Hx    Colon polyps Neg Hx    Sleep apnea Neg Hx     SOCIAL HISTORY: Social History   Socioeconomic History   Marital status: Divorced    Spouse name:  Kennyth Lose   Number of children: Not on file   Years of education: Not on file   Highest education level: Not on file  Occupational History   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  Tobacco Use   Smoking status: Never   Smokeless tobacco: Never  Vaping Use   Vaping Use: Never used  Substance and Sexual Activity   Alcohol use: Not Currently    Comment: Not drinking x 1 month   Drug use: Never   Sexual activity: Not Currently  Other Topics Concern   Not on file  Social History Narrative   ** Merged History Encounter **       Social Determinants of Health   Financial Resource Strain: Not on file  Food Insecurity: No Food Insecurity (11/05/2022)   Hunger Vital Sign    Worried About Running Out of Food in the Last Year: Never true    Ran Out of Food in the Last Year: Never true  Transportation Needs: No Transportation Needs (11/05/2022)   PRAPARE - Hydrologist (Medical): No    Lack of Transportation (Non-Medical): No  Physical Activity: Not on file  Stress: Not on file  Social Connections: Not on file  Intimate Partner Violence: Not At Risk (11/05/2022)   Humiliation, Afraid, Rape, and Kick questionnaire    Fear of Current or Ex-Partner: No    Emotionally Abused: No    Physically Abused: No    Sexually Abused: No      PHYSICAL EXAM  Vitals:   11/17/22 0959  BP: (!) 115/58  Pulse: 62  Weight: 205 lb (93 kg)  Height: 6' 1"$  (1.854 m)   Body mass index is 27.05 kg/m.     11/14/2021    9:08 AM 03/28/2021    9:26 AM 11/22/2020    8:30 AM  Montreal Cognitive Assessment   Visuospatial/ Executive (0/5) 4 4 4  $ Naming (0/3) 3 3 3   $ Attention: Read list of digits (0/2) 2 2 2  $ Attention: Read list of letters (0/1) 1 1 1  $ Attention: Serial 7 subtraction starting at 100 (0/3) 3 3 3  $ Language: Repeat phrase (0/2) 0 1 2  Language : Fluency (0/1) 1 1 1  $ Abstraction (0/2) 2 2 2  $ Delayed Recall (0/5) 4 1 3  $ Orientation (0/6) 6 6 6  $ Total 26 24 27     $ Generalized: Well developed, in no acute distress  Chest: Lungs clear to auscultation bilaterally  Neurological examination  Mentation: Alert oriented to time, place, history taking. Follows all commands speech and language fluent Cranial nerve II-XII: Extraocular movements were full, visual field were full on confrontational test Head turning and shoulder shrug  were normal and symmetric. Motor: The motor testing reveals 5 over 5 strength of all 4 extremities. Good symmetric motor tone is noted throughout.     DIAGNOSTIC DATA (LABS, IMAGING, TESTING) - I reviewed patient records, labs, notes, testing and imaging myself where available.  Lab Results  Component Value Date   WBC 5.6 11/13/2022   HGB 14.0 11/13/2022   HCT 40.2 11/13/2022   MCV 90.1 11/13/2022   PLT CANCELED 11/13/2022      Component Value Date/Time   NA 141 11/13/2022 0000   K 3.7 11/13/2022 0000   CL 106 11/13/2022 0000   CO2 26 11/13/2022 0000   GLUCOSE 115 (H) 11/13/2022 0000   BUN 11 11/13/2022 0000   CREATININE 1.58 (H) 11/13/2022 0000   CALCIUM 9.2 11/13/2022 0000  PROT 6.3 11/13/2022 0000   ALBUMIN 3.7 03/17/2022 1140   AST 35 11/13/2022 0000   ALT 19 11/13/2022 0000   ALKPHOS 81 03/17/2022 1140   BILITOT 2.1 (H) 11/13/2022 0000   GFRNONAA 36 (L) 11/08/2022 0053   GFRNONAA 49 (L) 01/09/2021 0959   GFRAA 57 (L) 01/09/2021 0959   Lab Results  Component Value Date   CHOL 125 11/04/2022   HDL 51 11/04/2022   LDLCALC 60 11/04/2022   TRIG 60 11/04/2022   CHOLHDL 2.5 11/04/2022   Lab Results  Component Value Date   HGBA1C 5.5 11/04/2022   Lab Results  Component Value Date    VITAMINB12 370 07/19/2019   Lab Results  Component Value Date   TSH 2.068 11/04/2022      ASSESSMENT AND PLAN 77 y.o. year old male  has a past medical history of BPH (benign prostatic hyperplasia), Fibromyalgia, GERD (gastroesophageal reflux disease), Hyperlipidemia, Hypertension, Hypogonadism male, IBS (irritable bowel syndrome), and OSA (obstructive sleep apnea). here with:  OSA on CPAP  -Discussed risk associated with untreated sleep apnea. -Will increase pressure to 12 cmH2O -Encouraged patient to use CPAP nightly and greater than 4 hours each night Follow-up in 6 months or sooner if needed       Ward Givens, MSN, NP-C 11/17/2022, 10:13 AM [facility] [address] [place], [place] [address] [phone]

## 2022-11-13 NOTE — Patient Instructions (Signed)
Atrial Fibrillation  Atrial fibrillation is a type of irregular or rapid heartbeat (arrhythmia). In atrial fibrillation, the top part of the heart (atria) beats in an irregular pattern. This makes the heart unable to pump blood normally and effectively. The goal of treatment is to prevent blood clots from forming, control your heart rate, or restore your heartbeat to a normal rhythm. If this condition is not treated, it can cause serious problems, such as a weakened heart muscle (cardiomyopathy) or a stroke. What are the causes? This condition is often caused by medical conditions that damage the heart's electrical system. These include: High blood pressure (hypertension). This is the most common cause. Certain heart problems or conditions, such as heart failure, coronary artery disease, heart valve problems, or heart surgery. Diabetes. Overactive thyroid (hyperthyroidism). Obesity. Chronic kidney disease. In some cases, the cause of this condition is not known. What increases the risk? This condition is more likely to develop in: Older people. People who smoke. Athletes who do endurance exercise. People who have a family history of atrial fibrillation. Men. People who use drugs. People who drink a lot of alcohol. People who have lung conditions, such as emphysema, pneumonia, or COPD. People who have obstructive sleep apnea. What are the signs or symptoms? Symptoms of this condition include: A feeling that your heart is racing or beating irregularly. Discomfort or pain in your chest. Shortness of breath. Sudden light-headedness or weakness. Tiring easily during exercise or activity. Fatigue. Syncope (fainting). Sweating. In some cases, there are no symptoms. How is this diagnosed? Your health care provider may detect atrial fibrillation when taking your pulse. If detected, this condition may be diagnosed with: An electrocardiogram (ECG) to check electrical signals of the  heart. An ambulatory cardiac monitor to record your heart's activity for a few days. A transthoracic echocardiogram (TTE) to create pictures of your heart. A transesophageal echocardiogram (TEE) to create even closer pictures of your heart. A stress test to check your blood supply while you exercise. Imaging tests, such as a CT scan or chest X-ray. Blood tests. How is this treated? Treatment depends on underlying conditions and how you feel when you experience atrial fibrillation. This condition may be treated with: Medicines to prevent blood clots or to treat heart rate or heart rhythm problems. Electrical cardioversion to reset the heart's rhythm. A pacemaker to correct abnormal heart rhythm. Ablation to remove the heart tissue that sends abnormal signals. Left atrial appendage closure to seal the area where blood clots can form. In some cases, underlying conditions will be treated. Follow these instructions at home: Medicines Take over-the counter and prescription medicines only as told by your health care provider. Do not take any new medicines without talking to your health care provider. If you are taking blood thinners: Talk with your health care provider before you take any medicines that contain aspirin or NSAIDs, such as ibuprofen. These medicines increase your risk for dangerous bleeding. Take your medicine exactly as told, at the same time every day. Avoid activities that could cause injury or bruising, and follow instructions about how to prevent falls. Wear a medical alert bracelet or carry a card that lists what medicines you take. Lifestyle     Do not use any products that contain nicotine or tobacco, such as cigarettes, e-cigarettes, and chewing tobacco. If you need help quitting, ask your health care provider. Eat heart-healthy foods. Talk with a dietitian to make an eating plan that is right for you. Exercise regularly as told  by your health care provider. Do not  drink alcohol. Lose weight if you are overweight. Do not use drugs, including cannabis. General instructions If you have obstructive sleep apnea, manage your condition as told by your health care provider. Do not use diet pills unless your health care provider approves. Diet pills can make heart problems worse. Keep all follow-up visits as told by your health care provider. This is important. Contact a health care provider if you: Notice a change in the rate, rhythm, or strength of your heartbeat. Are taking a blood thinner and you notice more bruising. Tire more easily when you exercise or do heavy work. Have a sudden change in weight. Get help right away if you have:  Chest pain, abdominal pain, sweating, or weakness. Trouble breathing. Side effects of blood thinners, such as blood in your vomit, stool, or urine, or bleeding that cannot stop. Any symptoms of a stroke. "BE FAST" is an easy way to remember the main warning signs of a stroke: B - Balance. Signs are dizziness, sudden trouble walking, or loss of balance. E - Eyes. Signs are trouble seeing or a sudden change in vision. F - Face. Signs are sudden weakness or numbness of the face, or the face or eyelid drooping on one side. A - Arms. Signs are weakness or numbness in an arm. This happens suddenly and usually on one side of the body. S - Speech. Signs are sudden trouble speaking, slurred speech, or trouble understanding what people say. T - Time. Time to call emergency services. Write down what time symptoms started. Other signs of a stroke, such as: A sudden, severe headache with no known cause. Nausea or vomiting. Seizure. These symptoms may represent a serious problem that is an emergency. Do not wait to see if the symptoms will go away. Get medical help right away. Call your local emergency services (911 in the [place] Do not drive yourself to the hospital. Summary Atrial fibrillation is a type of irregular or rapid  heartbeat (arrhythmia). Symptoms include a feeling that your heart is beating fast or irregularly. You may be given medicines to prevent blood clots or to treat heart rate or heart rhythm problems. Get help right away if you have signs or symptoms of a stroke. Get help right away if you cannot catch your breath or have chest pain or pressure. This information is not intended to replace advice given to you by your health care provider. Make sure you discuss any questions you have with your health care provider. Document Revised: 03/16/2019 Document Reviewed: 03/16/2019 Elsevier Patient Education  [place].

## 2022-11-13 NOTE — Progress Notes (Signed)
Future Appointments  Date Time Provider Department  11/13/2022 11:30 AM [name], MD [facility]  11/17/2022 10:00 AM Ward Givens, NP [facility]  12/08/2022 10:00 AM Vladimir Crofts, PA-C [facility]  03/04/2023                        cpe 10:00 AM [name], MD [facility] Follow-Up     This very nice 77 y.o. DWM was admitted to the hospital on 11/04/2022   and patient was discharged from the hospital 5 days ago on 11/08/2022    . The patient now presents for follow up for transition from recent hospitalization .  The day after discharge  our clinical staff contacted the patient to assure stability and schedule a follow up appointment. The discharge summary, medications and diagnostic test results were reviewed before meeting with the patient. The patient was admitted for:    1. New onset atrial fibrillation ([place])  2. Essential hypertension  3. Stage 3a chronic kidney disease ([place])  4. Alcoholic cirrhosis ([place])  5. OSA (obstructive sleep apnea)      Patient was hospitalized on 01/30 /2024 from the office with rapid Afib thru the ER. On 11/07/2022 , patient underwent DC CV by Dr Doylene Canard. With restoration of NSR.  Meds were stabilized, patient was started on Eiquis and as stable, patient was d/c'd for out-patient f/u with Dr  Doylene Canard. Marland Kitchen        Hospitalization discharge instructions and medications are reconciled with the patient.        Patient is also followed with Hypertension, Hyperlipidemia, Pre-Diabetes and Vitamin D Deficiency.        Patient is treated for HTN & BP has been controlled at home. Today's BP is at goal -  128/70. Patient has had no complaints of any cardiac type chest pain, palpitations, dyspnea/orthopnea/PND, dizziness, claudication, or dependent edema.       Hyperlipidemia is controlled with diet & meds. Patient denies myalgias or other med SE's. Last Lipids were at goal :  Lab Results  Component Value Date   CHOL 125  11/04/2022   HDL 51 11/04/2022   LDLCALC 60 11/04/2022   TRIG 60 11/04/2022   CHOLHDL 2.5 11/04/2022        Also, the patient has history of T2_NIDDM PreDiabetes and has had no symptoms of reactive hypoglycemia, diabetic polys, paresthesias or visual blurring.  Last A1c was  at goal :  Lab Results  Component Value Date   HGBA1C 5.5 11/04/2022        Further, the patient also has history of Vitamin D Deficiency and supplements vitamin D without any suspected side-effects. Last vitamin D was  very low  (goal 70-100) :  Lab Results  Component Value Date   VD25OH 34 07/24/2022     Current Outpatient Medications on File Prior to Visit  Medication Sig   acetaminophen (TYLENOL) 650 MG CR tablet Take 650 mg  every 8  hours as needed for pain   apixaban (ELIQUIS) 5 MG TABS tablet Take 1 tablet  2  times daily.   diltiazem CD) 180 MG 24 hr capsule Take 1 capsule  daily.   lactulose (CHRONULAC) 10 GM/15ML solution Take 15 mL up to 3 times daily   LORazepam (ATIVAN) 1 MG tablet TAKE 1 TABLET  EACH NIGHT AT BEDTIME    losartan  25 MG tablet Take 1 tablet  daily.   methocarbamol (ROBAXIN)  500 MG tablet TAKE 1 TABLET  EVERY 6 HOURS AS NEEDED    metoprolol succinate  XL 50 MG  Take 1 tablet  daily   Multiple Vitamin (MULTIVITAMIN) capsule Take 1 capsule daily.   pantoprazole (PROTONIX) 40 MG tablet Take 1 tablet daily.   psyllium (METAMUCIL) 58.6 % packet Take 1 packet aily.   rosuvastatin (CRESTOR) 40 MG tablet Take  1 tablet  Daily  for Cholesterol    sertraline (ZOLOFT) 100 MG tablet Take 1 tablet  at bedtime     Allergies  Allergen Reactions   Asa [Aspirin] Diarrhea, Nausea Only and Other (See Comments)    History of ulcers, also   Atorvastatin Other (See Comments)    Muscle aches   Celebrex [Celecoxib] Other (See Comments)    Headaches    Codeine Nausea And Vomiting   Savella [Milnacipran Hcl] Other (See Comments)    Reaction not recalled   Viagra [Sildenafil Citrate] Other  (See Comments)    Headaches   Penicillins Rash     PMHx:   Past Medical History:  Diagnosis Date   BPH (benign prostatic hyperplasia)    Fibromyalgia    GERD (gastroesophageal reflux disease)    Hyperlipidemia    Hypertension    Hypogonadism male    IBS (irritable bowel syndrome)    OSA (obstructive sleep apnea)      Immunization History  Administered Date(s) Administered   PFIZER-SARS-COV-2 Vacc 11/19/2019, 12/11/2019   Pneumococcal  - 13 09/17/2018   Pneumococcal  - 23 09/26/2020   Pneumococcal - 23  07/16/2011   Tdap 01/13/2010   Zoster, Live 08/09/2013     Past Surgical History:  Procedure Laterality Date    nasal smr np3  1985   BUBBLE STUDY  11/07/2022   Procedure: BUBBLE STUDY;  Surgeon: Dixie Dials, MD;  Location: [place];  Service: Cardiovascular;;   CARDIOVERSION N/A 11/07/2022   Procedure: CARDIOVERSION;  Surgeon: Dixie Dials, MD;  Location: [place];  Service: Cardiovascular;  Laterality: N/A;   CATARACT EXTRACTION, BILATERAL Bilateral 2021   Dr. Katy Fitch, L 5/27, R 3/25   COLONOSCOPY     KNEE ARTHROSCOPY Left 1999   SKIN CANCER EXCISION  2020   nose   SPINE SURGERY  2007   L5 S 1 Disk   SPINE SURGERY  09/2019   Compression fracture stabilization by Dr. Arnoldo Morale   TEE WITHOUT CARDIOVERSION N/A 11/07/2022   Procedure: TRANSESOPHAGEAL ECHOCARDIOGRAM (TEE);  Surgeon: Dixie Dials, MD;  Location[facility] ENDOSCOPY;  Service: Cardiovascular;  Laterality: N/A;   VASECTOMY  1983     FHx:    Reviewed / unchanged  SHx:    Reviewed / unchanged  Systems Review:  Constitutional: Denies fever, chills, wt changes, headaches, insomnia, fatigue, night sweats, change in appetite. Eyes: Denies redness, blurred vision, diplopia, discharge, itchy, watery eyes.  ENT: Denies discharge, congestion, post nasal drip, epistaxis, sore throat, earache, hearing loss, dental pain, tinnitus, vertigo, sinus pain, snoring.  CV: Denies chest pain, palpitations, irregular  heartbeat, syncope, dyspnea, diaphoresis, orthopnea, PND, claudication or edema. Respiratory: denies cough, dyspnea, DOE, pleurisy, hoarseness, laryngitis, wheezing.  Gastrointestinal: Denies dysphagia, odynophagia, heartburn, reflux, water brash, abdominal pain or cramps, nausea, vomiting, bloating, diarrhea, constipation, hematemesis, melena, hematochezia  or hemorrhoids. Genitourinary: Denies dysuria, frequency, urgency, nocturia, hesitancy, discharge, hematuria or flank pain. Musculoskeletal: Denies arthralgias, myalgias, stiffness, jt. swelling, pain, limping or strain/sprain.  Skin: Denies pruritus, rash, hives, warts, acne, eczema or change in skin lesion(s). Neuro: No weakness, tremor, incoordination,  spasms, paresthesia or pain. Psychiatric: Denies confusion, memory loss or sensory loss. Endo: Denies change in weight, skin or hair change.  Heme/Lymph: No excessive bleeding, bruising or enlarged lymph nodes.  Physical Exam  BP 128/70   Pulse 65   Temp 98 F (36.7 C)   Resp 18   Ht 6' 1"$  (1.854 m)   Wt 210 lb (95.3 kg)   SpO2 98%   BMI 27.71 kg/m   Appears well nourished, well groomed  and in no distress.  Eyes: PERRLA, EOMs, conjunctiva no swelling or erythema. Sinuses: No frontal/maxillary tenderness ENT/Mouth: EAC's clear, TM's nl w/o erythema, bulging. Nares clear w/o erythema, swelling, exudates. Oropharynx clear without erythema or exudates. Oral hygiene is good. Tongue normal, non obstructing. Hearing intact.  Neck: Supple. Thyroid nl. Car 2+/2+ without bruits, nodes or JVD. Chest: Respirations nl with BS clear & equal w/o rales, rhonchi, wheezing or stridor.  Cor: Heart sounds normal w/ regular rate and rhythm without sig. murmurs, gallops, clicks or rubs. Peripheral pulses normal and equal  without edema.  Abdomen: Soft & bowel sounds normal. Non-tender w/o guarding, rebound, hernias, masses or organomegaly.  Lymphatics: Unremarkable.  Musculoskeletal: Full ROM all  peripheral extremities, joint stability, 5/5 strength and normal gait.  Skin: Warm, dry without exposed rashes, lesions or ecchymosis apparent.  Neuro: Cranial nerves intact, reflexes equal bilaterally. Sensory-motor testing grossly intact. Tendon reflexes grossly intact.  Pysch: Alert & oriented x 3.  Insight and judgement nl & appropriate. No ideations.  Assessment and Plan:   - Continue diet/meds, exercise,& lifestyle modifications.  - Continue monitor periodic cholesterol/liver & renal functions   1. New onset atrial fibrillation ([place])   2. Essential hypertension  - Continue medication, monitor blood pressure at home.  - Continue DASH diet.  Reminder to go to the ER if any CP,  SOB, nausea, dizziness, severe HA, changes vision/speech.   - CBC with Differential/Platelet - COMPLETE METABOLIC PANEL WITH GFR  3. Stage 3a chronic kidney disease (H[facility])  - COMPLETE METABOLIC PANEL WITH GFR  4. Alcoholic cirrhosis, hx ([facility])  - CBC with Differential/Platelet - COMPLETE METABOLIC PANEL WITH GFR  5. OSA (obstructive sleep apnea)   6. Medication management  - CBC with Differential/Platelet - COMPLETE METABOLIC PANEL WITH GFR        Discussed  regular exercise, BP monitoring, weight control to achieve/maintain BMI less than 25 and discussed meds and SE's. Recommended labs to assess and monitor clinical status with further disposition pending results of labs. Over 30 minutes of exam, counseling, chart review was performed.   Kirtland Bouchard, MD

## 2022-11-14 LAB — COMPLETE METABOLIC PANEL WITH GFR
AG Ratio: 1.3 (calc) (ref 1.0–2.5)
ALT: 19 U/L (ref 9–46)
AST: 35 U/L (ref 10–35)
Albumin: 3.5 g/dL — ABNORMAL LOW (ref 3.6–5.1)
Alkaline phosphatase (APISO): 91 U/L (ref 35–144)
BUN/Creatinine Ratio: 7 (calc) (ref 6–22)
BUN: 11 mg/dL (ref 7–25)
CO2: 26 mmol/L (ref 20–32)
Calcium: 9.2 mg/dL (ref 8.6–10.3)
Chloride: 106 mmol/L (ref 98–110)
Creat: 1.58 mg/dL — ABNORMAL HIGH (ref 0.70–1.28)
Globulin: 2.8 g/dL (calc) (ref 1.9–3.7)
Glucose, Bld: 115 mg/dL — ABNORMAL HIGH (ref 65–99)
Potassium: 3.7 mmol/L (ref 3.5–5.3)
Sodium: 141 mmol/L (ref 135–146)
Total Bilirubin: 2.1 mg/dL — ABNORMAL HIGH (ref 0.2–1.2)
Total Protein: 6.3 g/dL (ref 6.1–8.1)
eGFR: 46 mL/min/{1.73_m2} — ABNORMAL LOW (ref >=60)

## 2022-11-14 LAB — CBC WITH DIFFERENTIAL/PLATELET
Absolute Monocytes: 594 cells/uL (ref 200–950)
Basophils Absolute: 28 cells/uL (ref 0–200)
Basophils Relative: 0.5 %
Eosinophils Absolute: 274 cells/uL (ref 15–500)
Eosinophils Relative: 4.9 %
HCT: 40.2 % (ref 38.5–50.0)
Hemoglobin: 14 g/dL (ref 13.2–17.1)
Lymphs Abs: 2296 cells/uL (ref 850–3900)
MCH: 31.4 pg (ref 27.0–33.0)
MCHC: 34.8 g/dL (ref 32.0–36.0)
MCV: 90.1 fL (ref 80.0–100.0)
Monocytes Relative: 10.6 %
Neutro Abs: 2408 cells/uL (ref 1500–7800)
Neutrophils Relative %: 43 %
RBC: 4.46 10*6/uL (ref 4.20–5.80)
RDW: 13.7 % (ref 11.0–15.0)
Total Lymphocyte: 41 %
WBC: 5.6 10*3/uL (ref 3.8–10.8)

## 2022-11-15 NOTE — Progress Notes (Signed)
<><><><><><><><><><><><><><><><><><><><><><><><><><><><><><><><><> <><><><><><><><><><><><><><><><><><><><><><><><><><><><><><><><><> -   Test results slightly outside the reference range are not unusual. If there is anything important, I will review this with you,  otherwise it is considered normal test values.  If you have further questions,  please do not hesitate to contact me at the office or via My Chart.  <><><><><><><><><><><><><><><><><><><><><><><><><><><><><><><><><> <><><><><><><><><><><><><><><><><><><><><><><><><><><><><><><><><>  -   Kidney functions improved back to baseline  <><><><><><><><><><><><><><><><><><><><><><><><><><><><><><><><><>  -   CBC -  is Normal &  OK   <><><><><><><><><><><><><><><><><><><><><><><><><><><><><><><><><> <><><><><><><><><><><><><><><><><><><><><><><><><><><><><><><><><>

## 2022-11-16 ENCOUNTER — Encounter: Payer: Self-pay | Admitting: Internal Medicine

## 2022-11-17 ENCOUNTER — Encounter: Payer: Self-pay | Admitting: Adult Health

## 2022-11-17 ENCOUNTER — Ambulatory Visit (INDEPENDENT_AMBULATORY_CARE_PROVIDER_SITE_OTHER): Payer: Medicare Other | Admitting: Adult Health

## 2022-11-17 VITALS — BP 115/58 | HR 62 | Ht 73.0 in | Wt 205.0 lb

## 2022-11-17 DIAGNOSIS — G4733 Obstructive sleep apnea (adult) (pediatric): Secondary | ICD-10-CM

## 2022-11-17 NOTE — Patient Instructions (Signed)
Continue using CPAP nightly and greater than 4 hours each night Will increase pressure 12 If your symptoms worsen or you develop new symptoms please let us know.

## 2022-11-17 NOTE — Progress Notes (Addendum)
Order for pressure increase sent to [facility]. [facility] confirmed receipt of order.

## 2022-12-04 ENCOUNTER — Ambulatory Visit: Payer: Medicare Other | Admitting: Psychiatry

## 2022-12-05 ENCOUNTER — Ambulatory Visit (INDEPENDENT_AMBULATORY_CARE_PROVIDER_SITE_OTHER): Payer: Medicare Other | Admitting: Psychiatry

## 2022-12-05 DIAGNOSIS — F411 Generalized anxiety disorder: Secondary | ICD-10-CM

## 2022-12-05 NOTE — Progress Notes (Signed)
[facility] Counselor/Therapist Progress Note  Patient ID: Cody Philmore Suliman Brisco., MRN: [id number],    Date: 12/05/2022  Time Spent: 50 minutes   Treatment Type: Individual Therapy  Reported Symptoms: anxiety, depression but "anxiety is the stronger symptom" currently  Mental Status Exam:  Appearance:   Casual     Behavior:  Appropriate, Sharing, and Motivated  Motor:  Slower and uses cane to walk  Speech/Language:   Some times a bit slower  Affect:  Depressed and anxious  Mood:  anxious and depressed  Thought process:  goal directed  Thought content:    Rumination  Sensory/Perceptual disturbances:    WNL  Orientation:  oriented to person, place, time/date, situation, day of week, month of year, year, and stated date of 12/05/22  Attention:  Fair  Concentration:  Good and Fair  Memory:  Some short term memory issues and states Dr is aware  Fund of knowledge:   Good and Fair  Insight:    Good and Fair  Judgment:   Good  Impulse Control:  Good   Risk Assessment: Danger to Self:  No Self-injurious Behavior: No Danger to Others: No Duty to Warn:no Physical Aggression / Violence:No  Access to Firearms a concern: No  Gang Involvement:No   Subjective: Patient in today reporting anxiety as main symptom, and also depression.  Denies any SI. Son and other family (sister) are in touch with him as well as several friends. Working with some negative thought patterns as much as he is able. Had some aFib issues recently, had to be "in hospital a few days but doing better now." Has not been as alone since last appt, some related to his physical health issues. Been back home for 3 wks now and "doing ok". Saw daughter recently and she was more attentive with patient. Loves his dog which is very therapeutic for patient.Reports his sleep is interrupted some due to having to take a medication from his doctor that makes him have to get up and go to restroom, but can then "go back to sleep."  Has friends that will be staying in touch with him, along with his adult son and daughter. Does participate well in therapy  likes to be able to vent concerns. Hard at times not to focus on the negatives more than positives, which we continue to work on in sessions.  Interventions: Cognitive Behavioral Therapy, Solution-Oriented/Positive Psychology, and Ego-Supportive  Long term goal: Develop healthy cognitive patterns and beliefs about self and the world that lead to alleviation and help prevent relapse of depression. Short term goal: Identify and replace depressive thinking that leads to depressive feelings and actions.  He will replace the depressive thinking with more positive, hopeful thoughts that can lead to more positive and hopeful feelings. Strategy: Patient to more consistently monitor his thoughts and catch the depressive/negative thoughts, trying to change them to positive and more reality-based thought patterns. Look at his negative thoughts and weigh them against the evidence.  Diagnosis:   ICD-10-CM   1. Generalized anxiety disorder  F41.1      Plan:  Patient today participating well in session focusing on his anxiety and depression mostly related to personal and family/friend circumstances and situations.  Is making progress as noted above and needs to continue working with goal-directed behaviors in order to maintain that progress, continue to improve coping skills, and move further in a positive direction.Encouraged patient and practicing positive/self affirming behaviors including: Staying in contact with people who  care about him and are supportive, wearing his hearing aids more often as they definitely make his interactions with other people better, initiate interaction with adult children and grandchildren and his sister more often, using his cane when needed for stability and per doctor recommendations, encouraging self talk similar to examples discussed in session, getting  outside daily as he is able with Dr. Christie Beckers, staying focused in the present and what he can change versus cannot, focus more on enjoying situations in the present rather than going back and focusing on negative issues from the past, stay on his prescribed medication, take advantage of opportunities he has to be with other people including church/community/family, and recognize the strength he shows working with goal-directed behaviors to move in a direction that supports his improved emotional health and outlook.  Goal review of progress/challenges noted with patient.  Next appointment within 3 to 4 weeks.  This record has been created using [employer].  Chart creation errors have been sought, but may not always have been located and corrected.  Such creation errors do not reflect on the standard of medical care provided.   Shanon Ace, LCSW

## 2022-12-07 NOTE — Progress Notes (Signed)
12/08/2022 Cody Hines [id number] 1946-01-25  Referring provider: Lucky Cowboy, MD Primary GI doctor: Dr. Retia Passe (previously seen by Dr. 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 Recall placed this visit. -Hepatic encephalopathy Patient does have history of encephalopathy. Patient is not on lactulose, states he is having 2 Bm's a day. Due to hepatic encephalopathy and slowed reflexes, patient was advised not to drive. - No Ascites -Nutrition and low sodium diet discussed with patient and information given -Continue daily multivitamin -Recommended 30 minutes of aerobic and resistance exercise 3 days/week  History of adenomatous polyp of colon 04/22/2022 colonoscopy, no recall due to age  Patient Care Team: Lucky Cowboy, MD as PCP - General (Internal Medicine) Cherlyn Roberts, MD as Consulting Physician (Dermatology) Dorena Cookey, MD (Inactive) as Consulting Physician (Gastroenterology) Tressie Stalker, MD as Consulting Physician (Neurosurgery) Ernesto Rutherford, MD as Consulting Physician (Ophthalmology) Lucky Cowboy, MD (Internal Medicine)  HISTORY OF PRESENT ILLNESS: 77 y.o. male with a past medical history of hypertension, hyperlipidemia, prediabetes, vitamin D D deficiency, OSA on CPAP, mild cognitive impairment, depression, CKD stage IIIa, cirrhosis alcohol induced, thrombocytopenia, portal hypertension, mild encephalopathy and others listed below presents for follow-up of cirrhosis, colonoscopy/EGD.  04/22/2013 colonoscopy 6 mm polyp adenomatous  transverse colon recommended 5-year follow-up.  With Dr. Madilyn Fireman 06/22/2021 CT chest AB and pelvis with contrast that showed nodular liver contour compatible cirrhosis. No ductal dilatation. Had esophageal varices noted.  04/22/2022 EGD and colonoscopy with Dr. Adela Lank EGD for newly diagnosed cirrhosis, normal esophagus no varices, mild portal hypertensive gastritis, normal duodenum normal stomach.  Negative H. pylori Colonoscopy good prep multiple colonic angiodysplastic lesions, congested mucosa right colon likely portal hypertension, formed adenomatous polyp sigmoid colon, internal hemorrhoids. Due to age no recall screening colonoscopy.  03/21/22 labs unremarkable AFP tumor marker, no evidence of hemochromatosis, negative hepatitis B, immune to hepatitis a and B. 10/15/2022 RUQ [place]  cirrhotic liver morphology, biliary sludge without evidence of acute cholecystitis, no ascites. Ammonia 77 started on lactulose. 11/05/2022 INR 1.5  Since his last visit patient was admitted to the hospital 11/04/2022 for 5 days for new onset atrial fibrillation,  status post DCCV with restoration of NSR.  Started on Eliquis.  He denies leg swelling, AB swelling.  He states the lactulose was not helpful for Bm's, he increased to 30 ml and it did not help.  He is on miralax and metamucil with 2 Bm's a day.  He has had some falling which he has a history of, denies any confusion.  Will be 5 years in Oct that he has quit drinking. 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  BMI is 27.  Current Medications:    Current Outpatient Medications (Cardiovascular):    diltiazem (CARDIZEM CD) 180 MG 24 hr capsule, Take 1 capsule (180 mg total) by mouth daily.   losartan (COZAAR) 25 MG tablet, Take 1 tablet (25 mg total) by mouth daily.   metoprolol succinate (TOPROL XL) 50 MG 24 hr tablet, Take 1 tablet (50 mg total) by mouth daily. Take with or immediately following  a meal.   rosuvastatin (CRESTOR) 40 MG  tablet, Take  1 tablet  Daily  for Cholesterol   Current Outpatient Medications (Analgesics):    acetaminophen (TYLENOL) 650 MG CR tablet, Take 650 mg by mouth every 8 (eight) hours as needed for pain (or back pain or migraines).   Current Outpatient Medications (Hematological):    apixaban (ELIQUIS) 5 MG TABS tablet, Take 1 tablet (5 mg total) by mouth 2 (two) times daily.  Current Outpatient Medications (Other):    LORazepam (ATIVAN) 1 MG tablet, TAKE 1 TABLET BY MOUTH EACH NIGHT AT BEDTIME FOR SLEEP   methocarbamol (ROBAXIN) 500 MG tablet, TAKE 1 TABLET BY MOUTH EVERY 6 HOURS AS NEEDED FOR MUSCLE SPASMS   Multiple Vitamin (MULTIVITAMIN) capsule, Take 1 capsule by mouth daily.   pantoprazole (PROTONIX) 40 MG tablet, Take 1 tablet (40 mg total) by mouth daily.   psyllium (METAMUCIL) 58.6 % packet, Take 1 packet by mouth daily.   sertraline (ZOLOFT) 100 MG tablet, Take 1 tablet (100 mg total) by mouth in the morning and at bedtime.  Medical History:  Past Medical History:  Diagnosis Date   BPH (benign prostatic hyperplasia)    Fibromyalgia    GERD (gastroesophageal reflux disease)    Hyperlipidemia    Hypertension    Hypogonadism male    IBS (irritable bowel syndrome)    OSA (obstructive sleep apnea)    Allergies:  Allergies  Allergen Reactions   Asa [Aspirin] Diarrhea, Nausea Only and Other (See Comments)    History of ulcers, also   Atorvastatin Other (See Comments)    Muscle aches   Celebrex [Celecoxib] Other (See Comments)    Headaches    Codeine Nausea And Vomiting   Savella [Milnacipran Hcl] Other (See Comments)    Reaction not recalled   Viagra [Sildenafil Citrate] Other (See Comments)    Headaches   Penicillins Rash     Surgical History:  He  has a past surgical history that includes Spine surgery (2007); Knee arthroscopy (Left, 1999);  nasal smr np3 (1985); Vasectomy (1983); Skin cancer excision (2020); Spine surgery (09/2019); Cataract extraction, bilateral  (Bilateral, 2021); Colonoscopy; TEE without cardioversion (N/A, 11/07/2022); Cardioversion (N/A, 11/07/2022); and Bubble study (11/07/2022). Family History:  His family history includes Arthritis in his sister; Cancer in his father and mother; Diabetes in his father; Heart disease in his sister. Social History:   reports that he has quit smoking. His smoking use included cigarettes. He has never used smokeless tobacco. He reports that he does not currently use alcohol. He reports that he does not use drugs.  REVIEW OF SYSTEMS  : All other systems reviewed and negative except where noted in the History of Present Illness.   PHYSICAL EXAM: BP (!) 104/48   Pulse 60   Ht 6\' 1"  (1.854 m)   Wt 197 lb 6 oz (89.5 kg)   SpO2 97%   BMI 26.04 kg/m  General :  Alert, well developed male in no acute distress Head:  Normocephalic and atraumatic. Eyes :  scleral icterus,conjunctive pink  Heart:  regular rate and rhythm Pulm:  Clear anteriorly; no wheezing Abdomen:   Distended, Obese with ventral hernia, AB, skin exam normal, Sluggish bowel sounds.  no  tenderness . Without guarding and Without rebound, without hepatomegaly.  no   fluid wave, no  shifting dullness.  Extremities:   Without edema. Msk:  Symmetrical without gross deformities. Peripheral pulses intact.  Neurologic: Alert and  oriented x4;  grossly normal  neurologically. with asterixis or clonus.  Skin:   without jaundice. no palmar erythema or spider angioma.   Psychiatric:  Demonstrates good judgement and reason without abnormal affect or behaviors.     Doree Albee, PA-C 10:44 AM

## 2022-12-08 ENCOUNTER — Other Ambulatory Visit (INDEPENDENT_AMBULATORY_CARE_PROVIDER_SITE_OTHER): Payer: Medicare Other

## 2022-12-08 ENCOUNTER — Ambulatory Visit: Payer: Medicare Other | Admitting: Physician Assistant

## 2022-12-08 ENCOUNTER — Encounter: Payer: Self-pay | Admitting: Physician Assistant

## 2022-12-08 VITALS — BP 104/48 | HR 60 | Ht 73.0 in | Wt 197.4 lb

## 2022-12-08 DIAGNOSIS — K7682 Hepatic encephalopathy: Secondary | ICD-10-CM

## 2022-12-08 DIAGNOSIS — D696 Thrombocytopenia, unspecified: Secondary | ICD-10-CM

## 2022-12-08 DIAGNOSIS — K7460 Unspecified cirrhosis of liver: Secondary | ICD-10-CM | POA: Diagnosis not present

## 2022-12-08 DIAGNOSIS — Z86010 Personal history of colonic polyps: Secondary | ICD-10-CM

## 2022-12-08 DIAGNOSIS — Z860101 Personal history of adenomatous and serrated colon polyps: Secondary | ICD-10-CM

## 2022-12-08 DIAGNOSIS — K766 Portal hypertension: Secondary | ICD-10-CM

## 2022-12-08 LAB — COMPREHENSIVE METABOLIC PANEL
ALT: 15 U/L (ref 0–53)
AST: 26 U/L (ref 0–37)
Albumin: 3.4 g/dL — ABNORMAL LOW (ref 3.5–5.2)
Alkaline Phosphatase: 114 U/L (ref 39–117)
BUN: 9 mg/dL (ref 6–23)
CO2: 25 mEq/L (ref 19–32)
Calcium: 9.3 mg/dL (ref 8.4–10.5)
Chloride: 107 mEq/L (ref 96–112)
Creatinine, Ser: 1.65 mg/dL — ABNORMAL HIGH (ref 0.40–1.50)
GFR: 40.71 mL/min — ABNORMAL LOW (ref >60.00)
Glucose, Bld: 129 mg/dL — ABNORMAL HIGH (ref 70–99)
Potassium: 3.5 mEq/L (ref 3.5–5.1)
Sodium: 140 mEq/L (ref 135–145)
Total Bilirubin: 2.3 mg/dL — ABNORMAL HIGH (ref 0.2–1.2)
Total Protein: 6.4 g/dL (ref 6.0–8.3)

## 2022-12-08 LAB — CBC WITH DIFFERENTIAL/PLATELET
Basophils Absolute: 0 10*3/uL (ref 0.0–0.1)
Basophils Relative: 0.4 % (ref 0.0–3.0)
Eosinophils Absolute: 0.3 10*3/uL (ref 0.0–0.7)
Eosinophils Relative: 4.8 % (ref 0.0–5.0)
HCT: 39.4 % (ref 39.0–52.0)
Hemoglobin: 13.3 g/dL (ref 13.0–17.0)
Lymphocytes Relative: 34.1 % (ref 12.0–46.0)
Lymphs Abs: 1.9 10*3/uL (ref 0.7–4.0)
MCHC: 33.6 g/dL (ref 30.0–36.0)
MCV: 91.7 fl (ref 78.0–100.0)
Monocytes Absolute: 0.4 10*3/uL (ref 0.1–1.0)
Monocytes Relative: 8.1 % (ref 3.0–12.0)
Neutro Abs: 2.9 10*3/uL (ref 1.4–7.7)
Neutrophils Relative %: 52.6 % (ref 43.0–77.0)
Platelets: 191 10*3/uL (ref 150.0–400.0)
RBC: 4.29 Mil/uL (ref 4.22–5.81)
RDW: 15.4 % (ref 11.5–15.5)
WBC: 5.5 10*3/uL (ref 4.0–10.5)

## 2022-12-08 LAB — PROTIME-INR
INR: 1.9 ratio — ABNORMAL HIGH (ref 0.8–1.0)
Prothrombin Time: 20 s — ABNORMAL HIGH (ref 9.6–13.1)

## 2022-12-08 LAB — AMMONIA: Ammonia: 64 umol/L — ABNORMAL HIGH (ref 11–35)

## 2022-12-08 NOTE — Patient Instructions (Addendum)
Your provider has requested that you go to the basement level for lab work before leaving today. Press "B" on the elevator. The lab is located at the first door on the left as you exit the elevator.   You should have an imaging study every 6 months to monitor for the development of hepatocellular carcinoma (liver cancer). The risk is low, but, if liver cancer is diagnosed early, there are better treatment options. Will get AFP, INR, CBC, and CMET.  Will follow up in 6 months to check labs and evaluate.   I recommend a high-protein, primarily plant-based diet. Avoid red meat. Work to maintain a health weight. Weigh yourself daily- call if you have weight gain of greater than 5 lbs in a 1-2 days, leg swelling, or new swelling in your abdomen. Minimize salt intake- VERY important. Please do not consume more than 2000 mg of sodium every day. Monitor your blood pressure at home.  Stay active. Weight-based exercise for 30 minutes at least 3 days a week is recommended. I recommend that you not drink any alcohol including beer, wine, liquor, and non-alcoholic beer.   You are at increased risk of osteopenia and osteoporosis. You should be screened for these metabolic bone diseases if you have not already had the testing performed.  Cirrhosis Cirrhosis is long-term (chronic) liver injury. The liver is the body's largest internal organ, and it performs many functions. It converts food into energy, removes toxic material from the blood, makes important proteins, and absorbs necessary vitamins from food. In cirrhosis, healthy liver cells are replaced by scar tissue. This prevents blood from flowing through the liver and makes it difficult for the liver to complete its functions. What are the causes? Common causes of this condition are hepatitis C and long-term alcohol abuse. Other causes include: Nonalcoholic fatty liver disease (NAFLD). This happens when fat is deposited in the liver by causes other than  alcohol. Hepatitis B infection. Autoimmune hepatitis. In this condition, the body's defense system (immune system) mistakenly attacks the liver cells, causing inflammation. Diseases that cause blockage of ducts inside the liver. Inherited liver diseases, such as hemochromatosis. This is one of the most common inherited liver diseases. In this disease, deposits of iron collect in the liver and other organs. Reactions to certain long-term medicines, such as amiodarone, a heart medicine. Parasitic infections. These include schistosomiasis, which is caused by a flatworm. Long-term contact to certain toxins. These toxins include certain organic solvents, such as toluene and chloroform. What increases the risk? You are more likely to develop this condition if: You have certain types of viral hepatitis. You abuse alcohol, especially if you are male. You are overweight. You use IV drugs and share needles. You have unprotected sex with someone who has viral hepatitis. What are the signs or symptoms? You may not have any signs and symptoms at first. Symptoms may not develop until the damage to your liver starts to get worse. Early symptoms may include: Weakness and tiredness (fatigue). Changes in sleep patterns or having trouble sleeping. Itchiness. Tenderness in the right-upper part of your abdomen. Weight loss and muscle loss. Nausea. Loss of appetite. Later symptoms may include: Fatigue or weakness that is getting worse. Yellow skin and eyes (jaundice). Buildup of fluid in the abdomen (ascites). You may notice that your clothes are tight around your waist. Weight gain and swelling of the feet and ankles (edema). Trouble breathing. Easy bruising and bleeding. Vomiting blood, or black or bloody stool. Mental confusion. How is  this diagnosed? Your health care provider may suspect cirrhosis based on your symptoms and medical history, especially if you have other medical conditions or a  history of alcohol abuse. Your health care provider will do a physical exam to feel your liver and to check for signs of cirrhosis. Tests may include: Blood tests to check: For hepatitis B or C. Kidney function. Liver function. Imaging tests such as: MRI or CT scan to look for changes seen in advanced cirrhosis. Ultrasound to see if normal liver tissue is being replaced by scar tissue. A procedure in which a long needle is used to take a sample of liver tissue to be checked in a lab (biopsy). Liver biopsy can confirm the diagnosis of cirrhosis. How is this treated? Treatment for this condition depends on how damaged your liver is and what caused the damage. It may include treating the symptoms of cirrhosis, or treating the underlying causes to slow the damage. Treatment may include: Making lifestyle changes, such as: Eating a healthy diet. You may need to work with your health care provider or a dietitian to develop an eating plan. Restricting salt intake. Maintaining a healthy weight. Not abusing drugs or alcohol. Taking medicines to: Treat liver infections or other infections. Control itching. Reduce fluid buildup. Reduce certain blood toxins. Reduce risk of bleeding from enlarged blood vessels in the stomach or esophagus (varices). Liver transplant. In this procedure, a liver from a donor is used to replace your diseased liver. This is done if cirrhosis has caused liver failure. Other treatments and procedures may be done depending on the problems that you get from cirrhosis. Common problems include liver-related kidney failure (hepatorenal syndrome). Follow these instructions at home:  Take medicines only as told by your health care provider. Do not use medicines that are toxic to your liver. Ask your health care provider before taking any new medicines, including over-the-counter medicines such as NSAIDs. Rest as needed. Eat a well-balanced diet. Limit your salt or water intake, if  your health care provider asks you to do this. Do not drink alcohol. This is especially important if you routinely take acetaminophen. Keep all follow-up visits. This is important. Contact a health care provider if you: Have fatigue or weakness that is getting worse. Develop swelling of the hands, feet, or legs, or a buildup of fluid in the abdomen (ascites). Have a fever or chills. Develop loss of appetite. Have nausea or vomiting. Develop jaundice. Develop easy bruising or bleeding. Get help right away if you: Vomit bright red blood or a material that looks like coffee grounds. Have blood in your stools. Notice that your stools appear black and tarry. Become confused. Have chest pain or trouble breathing. These symptoms may represent a serious problem that is an emergency. Do not wait to see if the symptoms will go away. Get medical help right away. Call your local emergency services (911 in the [place] Do not drive yourself to the hospital. Summary Cirrhosis is chronic liver injury. Common causes are hepatitis C and long-term alcohol abuse. Tests used to diagnose cirrhosis include blood tests, imaging tests, and liver biopsy. Treatment for this condition involves treating the underlying cause. Avoid alcohol, drugs, salt, and medicines that may damage your liver. Get help right away if you vomit bright red blood or a material that looks like coffee grounds. This information is not intended to replace advice given to you by your health care provider. Make sure you discuss any questions you have with your health  care provider. Document Revised: 07/05/2020 Document Reviewed: 07/05/2020 Elsevier Patient Education  [place].

## 2022-12-08 NOTE — Progress Notes (Signed)
Agree with assessment and plan with the following thoughts.  INR not accurate in the setting of Eliquis use. His MELD may be artificially elevated due to this, and his CKD is likely driving this as well.  If he has symptoms of encephalopathy he should be on lactulose at baseline otherwise.

## 2022-12-10 ENCOUNTER — Other Ambulatory Visit: Payer: Self-pay | Admitting: *Deleted

## 2022-12-10 DIAGNOSIS — D696 Thrombocytopenia, unspecified: Secondary | ICD-10-CM

## 2022-12-10 DIAGNOSIS — R7989 Other specified abnormal findings of blood chemistry: Secondary | ICD-10-CM

## 2022-12-10 DIAGNOSIS — K7460 Unspecified cirrhosis of liver: Secondary | ICD-10-CM

## 2022-12-10 DIAGNOSIS — K7682 Hepatic encephalopathy: Secondary | ICD-10-CM

## 2022-12-10 LAB — AFP TUMOR MARKER: AFP-Tumor Marker: 4 ng/mL (ref <6.1)

## 2023-01-15 ENCOUNTER — Encounter: Payer: Medicare Other | Admitting: Internal Medicine

## 2023-01-16 ENCOUNTER — Ambulatory Visit (INDEPENDENT_AMBULATORY_CARE_PROVIDER_SITE_OTHER): Payer: Medicare Other | Admitting: Psychiatry

## 2023-01-16 DIAGNOSIS — F411 Generalized anxiety disorder: Secondary | ICD-10-CM

## 2023-01-16 NOTE — Progress Notes (Signed)
[facility] Counselor/Therapist Progress Note  Patient ID: Cody Hines., MRN: [id number],    Date: 01/16/2023  Time Spent:  48 minutes  Treatment Type: Individual Therapy  Reported Symptoms: anxiety, depression  Mental Status Exam:  Appearance:   Casual     Behavior:  Appropriate, Sharing, and Motivated  Motor:  Walking some slower and trying to be more careful due to previous fall  Speech/Language:   Clear and Coherent  Affect:  Depressed and anxious  Mood:  anxious, some depression  Thought process:  normal  Thought content:    Rumination  Sensory/Perceptual disturbances:    WNL  Orientation:  oriented to person, place, time/date, situation, day of week, month of year, year, and stated date of (Patient stated date today as being April 22nd)  Attention:  Fair  Concentration:  Fair  Memory:  Some forgetting but states his Dr is aware  Fund of knowledge:   Good  Insight:    Good and Fair  Judgment:   Good and Fair  Impulse Control:  Good   Risk Assessment: Danger to Self:  No Self-injurious Behavior: No Danger to Others: No Duty to Warn:no Physical Aggression / Violence:No  Access to Firearms a concern: No  Gang Involvement:No   Subjective:  Patient in today reporting anxiety and some depression, mostly due to some aging and "not being as mobile or steady as I used to beMicrosoft how he takes extra precaution now in moving about so as to prevent further falls. 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 Is noticing some differences in "what I focus on or pay attention to for example some friends invited him over for [employer] and he didn't realize it was [place] until he got home and received a text message from a grandchild saying Happy Easter." Reports certain things can make his anxiety worse including rainy days, to see other's having family gatherings and patient's family  doesn't get together quite as often and live further apart. Still having some negative thoughts at times and is having understandable frustrations with some business situations within the family. Still has occasional aFib issues but states he hasn't had to go back to hospital. Very difficult for patient not to dwell on negatives but continuing to work on this in sessions. States he sleeps ok but sometimes too much. Appreciates being able to come for therapy  and share his concerns "and things that make me anxious and depressed." Trying to assist him in not overly dwelling on the negatives and encourage his involvement with part of the family and friends that show their love and concern for him.  Interventions: Cognitive Behavioral Therapy and Ego-Supportive  Long term goal: Develop healthy cognitive patterns and beliefs about self and the world that lead to alleviation and help prevent relapse of depression. Short term goal: Identify and replace depressive thinking that leads to depressive feelings and actions.  He will replace the depressive thinking with more positive, hopeful thoughts that can lead to more positive and hopeful feelings. Strategy: Patient to more consistently monitor his thoughts and catch the depressive/negative thoughts, trying to change them to positive and more reality-based thought patterns. Look at his negative thoughts and weigh them against the evidence.   Diagnosis:   ICD-10-CM   1. Generalized anxiety disorder  F41.1      Plan:  Patient today showing good participation in  session although difficult for him to make changes and hold onto changes.  Continue to work today on his anxiety and depression which has worsened some with his aging but also related to his difficulty and letting go of the negatives in his life especially related to his former wife and other family members at times.  Emphasize coping skills again with patient today.  Has made progress in certain areas  and needs to continue working with goal-directed behaviors in order to keep trying to move further in a forward direction. Encouraged patient in practicing more positive/self affirming behaviors including: Remain in contact with people who care about him and are supportive, wearing his hearing aids more often as they definitely make his interactions with other people better, initiate interaction with adult children and grandchildren and his sister regularly, using his cane when needed for stability per doctor recommendations, encouraging self talk similar to examples that we discussed in session, getting outside daily as he is able with Dr approval, stay focused in the present and what he can change versus cannot, focus more on enjoying situations in the present rather than going back and focusing on negative issues from the past, stay on his prescribed medication, take advantage of opportunities he has to be with other people including community/family/church, and realize the strength he shows working with goal-directed behaviors to move in a direction that supports his improved emotional health and outlook.  Goal review and progress/challenges noted with patient.  Next appt within 4 weeks.  This record has been created using [employer].  Chart creation errors have been sought, but may not always have been located and corrected.  Such creation errors do not reflect on the standard of medical care provided.   Mathis Fare, LCSW

## 2023-02-04 ENCOUNTER — Encounter: Payer: Self-pay | Admitting: Nurse Practitioner

## 2023-02-04 ENCOUNTER — Ambulatory Visit (INDEPENDENT_AMBULATORY_CARE_PROVIDER_SITE_OTHER): Payer: Medicare Other | Admitting: Nurse Practitioner

## 2023-02-04 VITALS — BP 108/52 | HR 71 | Temp 97.9°F | Ht 73.0 in | Wt 206.2 lb

## 2023-02-04 DIAGNOSIS — K7030 Alcoholic cirrhosis of liver without ascites: Secondary | ICD-10-CM | POA: Diagnosis not present

## 2023-02-04 DIAGNOSIS — I10 Essential (primary) hypertension: Secondary | ICD-10-CM | POA: Diagnosis not present

## 2023-02-04 DIAGNOSIS — I4891 Unspecified atrial fibrillation: Secondary | ICD-10-CM | POA: Diagnosis not present

## 2023-02-04 DIAGNOSIS — R609 Edema, unspecified: Secondary | ICD-10-CM | POA: Diagnosis not present

## 2023-02-04 DIAGNOSIS — I509 Heart failure, unspecified: Secondary | ICD-10-CM

## 2023-02-04 MED ORDER — FUROSEMIDE 20 MG PO TABS: ORAL_TABLET | ORAL | 2 refills | Status: DC

## 2023-02-04 NOTE — Progress Notes (Signed)
Assessment and Plan:  Cody Hines was seen today for foot swelling.  Diagnoses and all orders for this visit:  Essential hypertension - continue medications, DASH diet, exercise and monitor at home. Call if greater than 130/80.  Go to the ER if any chest pain, shortness of breath, nausea, dizziness, severe HA, changes vision/speech  Alcoholic cirrhosis, unspecified whether ascites present (HCC) Continue to follow with GI and use Lactulose  Atrial fibrillation, unspecified type (HCC) Was to follow with cardiology but never scheduled an appointment Continue medications and referred to cardiology -     Ambulatory referral to Cardiology  Dependent edema Wear compression socks Elevate legs above heart as much as possible throughout the day Weigh daily and if greater than 5 pound gain in 1 day notify the office or go to ER Continue medications and add Furosemide -     furosemide (LASIX) 20 MG tablet; Take 2 tabs x 2 days and then 1 tab daily first thing in the morning -     Ambulatory referral to Cardiology  Chronic heart failure, unspecified heart failure type (HCC) Was to follow with cardiology but never scheduled an appointment- referral placed Wear compression socks Elevate legs above heart as much as possible throughout the day Weigh daily and if greater than 5 pound gain in 1 day notify the office or go to ER Continue medications and add Furosemide -     furosemide (LASIX) 20 MG tablet; Take 2 tabs x 2 days and then 1 tab daily first thing in the morning -     Ambulatory referral to Cardiology      Further disposition pending results of labs. Discussed med's effects and SE's.   Over 30 minutes of exam, counseling, chart review, and critical decision making was performed.   Future Appointments  Date Time Provider Department Center  02/27/2023  9:00 AM Mathis Fare, LCSW [facility] None  03/04/2023 10:00 AM Lucky Cowboy, MD [facility] None  06/18/2023 10:00 AM Butch Penny, NP  [facility] None    ------------------------------------------------------------------------------------------------------------------   HPI BP (!) 108/52   Pulse 71   Temp 97.9 F (36.6 C)   Ht 6\' 1"  (1.854 m)   Wt 206 lb 3.2 oz (93.5 kg)   SpO2 96%   BMI 27.20 kg/m   77 y.o.male presents for complaints of swelling in feet.  6 days ago his left foot started swelling and 4 days ago they were both swollen.  His weight is up 9 pounds in the past 2 months. He does not follow with cardiology.  He was newly diagnosed with atrial fibrillation on 1/30//24- had cardioversion with restoration to NSR. He was to follow up with cardiology 2 weeks after getting out of hospital but never made an appointment.   He is currently on Losartan 25 mg QD, metoprolol 50 mg QD and diltiazem 180 QD.  Denies shortness of breath but has noticed increased swelling of feet.  BP Readings from Last 3 Encounters:  02/04/23 (!) 108/52  12/08/22 (!) 104/48  11/17/22 (!) 115/58   BMI is Body mass index is 27.2 kg/m., he has not been working on diet and exercise. He does weigh himself at home and has not had greater than 5 pound difference between days.  Wt Readings from Last 3 Encounters:  02/04/23 206 lb 3.2 oz (93.5 kg)  12/08/22 197 lb 6 oz (89.5 kg)  11/17/22 205 lb (93 kg)     Past Medical History:  Diagnosis Date   BPH (benign prostatic hyperplasia)  Fibromyalgia    GERD (gastroesophageal reflux disease)    Hyperlipidemia    Hypertension    Hypogonadism male    IBS (irritable bowel syndrome)    OSA (obstructive sleep apnea)      Allergies  Allergen Reactions   Asa [Aspirin] Diarrhea, Nausea Only and Other (See Comments)    History of ulcers, also   Atorvastatin Other (See Comments)    Muscle aches   Celebrex [Celecoxib] Other (See Comments)    Headaches    Codeine Nausea And Vomiting   Savella [Milnacipran Hcl] Other (See Comments)    Reaction not recalled   Viagra [Sildenafil Citrate]  Other (See Comments)    Headaches   Penicillins Rash    Current Outpatient Medications on File Prior to Visit  Medication Sig   acetaminophen (EQ 8HR ARTHRITIS PAIN RELIEF) 650 MG CR tablet 1 TABLET EVERY 6 HOURS (route: BY MOUTH)   acetaminophen (TYLENOL) 650 MG CR tablet Take 650 mg by mouth every 8 (eight) hours as needed for pain (or back pain or migraines).    apixaban (ELIQUIS) 5 MG TABS tablet Take 1 tablet (5 mg total) by mouth 2 (two) times daily.   Cholecalciferol (VITAMIN D3) 50 MCG (2000 UT) TABS 1 capsule DAILY (route: oral)   diltiazem (CARDIZEM CD) 180 MG 24 hr capsule Take 1 capsule (180 mg total) by mouth daily.   Flaxseed, Linseed, (FLAX SEED OIL PO) Take by mouth.   LORazepam (ATIVAN) 1 MG tablet TAKE 1 TABLET BY MOUTH EACH NIGHT AT BEDTIME FOR SLEEP   losartan (COZAAR) 25 MG tablet Take 1 tablet (25 mg total) by mouth daily.   Magnesium 400 MG TABS 1 capsule DAILY (route: oral)   methocarbamol (ROBAXIN) 500 MG tablet TAKE 1 TABLET BY MOUTH EVERY 6 HOURS AS NEEDED FOR MUSCLE SPASMS   Multiple Vitamin (MULTIVITAMIN) capsule Take 1 capsule by mouth daily.   psyllium (METAMUCIL) 58.6 % packet Take 1 packet by mouth daily.   rosuvastatin (CRESTOR) 40 MG tablet Take  1 tablet  Daily  for Cholesterol   sertraline (ZOLOFT) 100 MG tablet Take 1 tablet (100 mg total) by mouth in the morning and at bedtime.   metoprolol succinate (TOPROL XL) 50 MG 24 hr tablet Take 1 tablet (50 mg total) by mouth daily. Take with or immediately following a meal.   pantoprazole (PROTONIX) 40 MG tablet Take 1 tablet (40 mg total) by mouth daily. (Patient not taking: Reported on 02/04/2023)   No current facility-administered medications on file prior to visit.    ROS: all negative except above.   Physical Exam:  BP (!) 108/52   Pulse 71   Temp 97.9 F (36.6 C)   Ht 6\' 1"  (1.854 m)   Wt 206 lb 3.2 oz (93.5 kg)   SpO2 96%   BMI 27.20 kg/m   General Appearance: Well nourished, in no apparent  distress. Eyes: PERRLA, EOMs, conjunctiva no swelling or erythema Sinuses: No Frontal/maxillary tenderness Respiratory: Respiratory effort normal, BS equal bilaterally without rales, rhonchi, wheezing or stridor.  Cardio: RRR with no MRGs. Diminished pulses with 2 + pitting edema of lower legs Abdomen: Soft, + BS.  Non tender, no guarding, rebound, hernias, masses. Lymphatics: Non tender without lymphadenopathy.  Musculoskeletal: Full ROM, 5/5 strength, normal gait.  Skin: Warm, dry without rashes, lesions, ecchymosis.  Neuro: Cranial nerves intact. Normal muscle tone, no cerebellar symptoms. Sensation intact.  Psych: Awake and oriented X 3, normal affect, Insight and Judgment appropriate.  Raynelle Dick, NP 11:52 AM [facility]

## 2023-02-04 NOTE — Patient Instructions (Addendum)
Take furosemide 2 tabs first thing in the morning for 2 days and then take 1 tablet every morning.  Try to elevate legs above heart level as much as possible and wear compression socks to help continue to reduce swelling  Weigh daily and if weight increases more than 5 pounds in 1 day go to the ER  Go to the ER if any chest pain, shortness of breath, nausea, dizziness, severe HA, changes vision/speech   Edema  Edema is an abnormal buildup of fluids in the body tissues and under the skin. Swelling of the legs, feet, and ankles is a common symptom that becomes more likely as you get older. Swelling is also common in looser tissues, such as around the eyes. Pressing on the area may make a temporary dent in your skin (pitting edema). This fluid may also accumulate in your lungs (pulmonary edema). There are many possible causes of edema. Eating too much salt (sodium) and being on your feet or sitting for a long time can cause edema in your legs, feet, and ankles. Common causes of edema include: Certain medical conditions, such as heart failure, liver or kidney disease, and cancer. Weak leg blood vessels. An injury. Pregnancy. Medicines. Being obese. Low protein levels in the blood. Hot weather may make edema worse. Edema is usually painless. Your skin may look swollen or shiny. Follow these instructions at home: Medicines Take over-the-counter and prescription medicines only as told by your health care provider. Your health care provider may prescribe a medicine to help your body get rid of extra water (diuretic). Take this medicine if you are told to take it. Eating and drinking Eat a low-salt (low-sodium) diet to reduce fluid as told by your health care provider. Sometimes, eating less salt may reduce swelling. Depending on the cause of your swelling, you may need to limit how much fluid you drink (fluid restriction). General instructions Raise (elevate) the injured area above the level of  your heart while you are sitting or lying down. Do not sit still or stand for long periods of time. Do not wear tight clothing. Do not wear garters on your upper legs. Exercise your legs to get your circulation going. This helps to move the fluid back into your blood vessels, and it may help the swelling go down. Wear compression stockings as told by your health care provider. These stockings help to prevent blood clots and reduce swelling in your legs. It is important that these are the correct size. These stockings should be prescribed by your health care provider to prevent possible injuries. If elastic bandages or wraps are recommended, use them as told by your health care provider. Contact a health care provider if: Your edema does not get better with treatment. You have heart, liver, or kidney disease and have symptoms of edema. You have sudden and unexplained weight gain. Get help right away if: You develop shortness of breath or chest pain. You cannot breathe when you lie down. You develop pain, redness, or warmth in the swollen areas. You have heart, liver, or kidney disease and suddenly get edema. You have a fever and your symptoms suddenly get worse. These symptoms may be an emergency. Get help right away. Call 911. Do not wait to see if the symptoms will go away. Do not drive yourself to the hospital. Summary Edema is an abnormal buildup of fluids in the body tissues and under the skin. Eating too much salt (sodium)and being on your feet or sitting  for a long time can cause edema in your legs, feet, and ankles. Raise (elevate) the injured area above the level of your heart while you are sitting or lying down. Follow your health care provider's instructions about diet and how much fluid you can drink. This information is not intended to replace advice given to you by your health care provider. Make sure you discuss any questions you have with your health care provider. Document  Revised: 05/27/2021 Document Reviewed: 05/27/2021 Elsevier Patient Education  2023 [employer].

## 2023-02-10 NOTE — Progress Notes (Unsigned)
Assessment and Plan:  There are no diagnoses linked to this encounter.    Further disposition pending results of labs. Discussed med's effects and SE's.   Over 30 minutes of exam, counseling, chart review, and critical decision making was performed.   Future Appointments  Date Time Provider Department Center  02/11/2023 11:30 AM Raynelle Dick, NP [facility] None  02/27/2023  9:00 AM Mathis Fare, LCSW [facility] None  03/04/2023 10:00 AM Lucky Cowboy, MD [facility] None  03/31/2023  2:40 PM Meriam Sprague, MD [facility]  06/18/2023 10:00 AM Butch Penny, NP [facility] None    ------------------------------------------------------------------------------------------------------------------   HPI There were no vitals taken for this visit. 77 y.o.male presents for  Past Medical History:  Diagnosis Date   BPH (benign prostatic hyperplasia)    Fibromyalgia    GERD (gastroesophageal reflux disease)    Hyperlipidemia    Hypertension    Hypogonadism male    IBS (irritable bowel syndrome)    OSA (obstructive sleep apnea)      Allergies  Allergen Reactions   Asa [Aspirin] Diarrhea, Nausea Only and Other (See Comments)    History of ulcers, also   Atorvastatin Other (See Comments)    Muscle aches   Celebrex [Celecoxib] Other (See Comments)    Headaches    Codeine Nausea And Vomiting   Savella [Milnacipran Hcl] Other (See Comments)    Reaction not recalled   Viagra [Sildenafil Citrate] Other (See Comments)    Headaches   Penicillins Rash    Current Outpatient Medications on File Prior to Visit  Medication Sig   acetaminophen (EQ 8HR ARTHRITIS PAIN RELIEF) 650 MG CR tablet 1 TABLET EVERY 6 HOURS (route: BY MOUTH)   acetaminophen (TYLENOL) 650 MG CR tablet Take 650 mg by mouth every 8 (eight) hours as needed for pain (or back pain or migraines).    apixaban (ELIQUIS) 5 MG TABS tablet Take 1 tablet (5 mg total) by mouth 2 (two) times daily.    Cholecalciferol (VITAMIN D3) 50 MCG (2000 UT) TABS 1 capsule DAILY (route: oral)   diltiazem (CARDIZEM CD) 180 MG 24 hr capsule Take 1 capsule (180 mg total) by mouth daily.   Flaxseed, Linseed, (FLAX SEED OIL PO) Take by mouth.   furosemide (LASIX) 20 MG tablet Take 2 tabs x 2 days and then 1 tab daily first thing in the morning   LORazepam (ATIVAN) 1 MG tablet TAKE 1 TABLET BY MOUTH EACH NIGHT AT BEDTIME FOR SLEEP   losartan (COZAAR) 25 MG tablet Take 1 tablet (25 mg total) by mouth daily.   Magnesium 400 MG TABS 1 capsule DAILY (route: oral)   methocarbamol (ROBAXIN) 500 MG tablet TAKE 1 TABLET BY MOUTH EVERY 6 HOURS AS NEEDED FOR MUSCLE SPASMS   metoprolol succinate (TOPROL XL) 50 MG 24 hr tablet Take 1 tablet (50 mg total) by mouth daily. Take with or immediately following a meal.   Multiple Vitamin (MULTIVITAMIN) capsule Take 1 capsule by mouth daily.   pantoprazole (PROTONIX) 40 MG tablet Take 1 tablet (40 mg total) by mouth daily. (Patient not taking: Reported on 02/04/2023)   psyllium (METAMUCIL) 58.6 % packet Take 1 packet by mouth daily.   rosuvastatin (CRESTOR) 40 MG tablet Take  1 tablet  Daily  for Cholesterol   sertraline (ZOLOFT) 100 MG tablet Take 1 tablet (100 mg total) by mouth in the morning and at bedtime.   No current facility-administered medications on file prior to visit.    ROS: all negative  except above.   Physical Exam:  There were no vitals taken for this visit.  General Appearance: Well nourished, in no apparent distress. Eyes: PERRLA, EOMs, conjunctiva no swelling or erythema Sinuses: No Frontal/maxillary tenderness ENT/Mouth: Ext aud canals clear, TMs without erythema, bulging. No erythema, swelling, or exudate on post pharynx.  Tonsils not swollen or erythematous. Hearing normal.  Neck: Supple, thyroid normal.  Respiratory: Respiratory effort normal, BS equal bilaterally without rales, rhonchi, wheezing or stridor.  Cardio: RRR with no MRGs. Brisk  peripheral pulses without edema.  Abdomen: Soft, + BS.  Non tender, no guarding, rebound, hernias, masses. Lymphatics: Non tender without lymphadenopathy.  Musculoskeletal: Full ROM, 5/5 strength, normal gait.  Skin: Warm, dry without rashes, lesions, ecchymosis.  Neuro: Cranial nerves intact. Normal muscle tone, no cerebellar symptoms. Sensation intact.  Psych: Awake and oriented X 3, normal affect, Insight and Judgment appropriate.     Raynelle Dick, NP 10:37 AM [facility]

## 2023-02-11 ENCOUNTER — Ambulatory Visit (INDEPENDENT_AMBULATORY_CARE_PROVIDER_SITE_OTHER): Payer: Medicare Other | Admitting: Nurse Practitioner

## 2023-02-11 ENCOUNTER — Encounter: Payer: Self-pay | Admitting: Nurse Practitioner

## 2023-02-11 VITALS — BP 102/52 | HR 75 | Temp 98.1°F | Ht 73.0 in | Wt 204.4 lb

## 2023-02-11 DIAGNOSIS — Z79899 Other long term (current) drug therapy: Secondary | ICD-10-CM | POA: Diagnosis not present

## 2023-02-11 DIAGNOSIS — I509 Heart failure, unspecified: Secondary | ICD-10-CM | POA: Diagnosis not present

## 2023-02-11 DIAGNOSIS — I10 Essential (primary) hypertension: Secondary | ICD-10-CM

## 2023-02-11 DIAGNOSIS — R609 Edema, unspecified: Secondary | ICD-10-CM | POA: Diagnosis not present

## 2023-02-11 DIAGNOSIS — I4891 Unspecified atrial fibrillation: Secondary | ICD-10-CM

## 2023-02-11 NOTE — Patient Instructions (Addendum)
Stop Losartan 25 mg and monitor Blood Pressure at home. If blood pressure is consistently greater than 130/80 with change in medication please call the office  Continue Metoprolol 50 mg daily and Diltiazem 180 mg daily Continue Furosemide 20 mg daily  Monitor weight daily and if increase greater than 5 pounds in a day notify the office.  Appointment is scheduled with cardiology on 03/11/23

## 2023-02-12 LAB — BASIC METABOLIC PANEL WITH GFR
BUN/Creatinine Ratio: 6 (calc) (ref 6–22)
BUN: 10 mg/dL (ref 7–25)
CO2: 29 mmol/L (ref 20–32)
Calcium: 9.6 mg/dL (ref 8.6–10.3)
Chloride: 106 mmol/L (ref 98–110)
Creat: 1.57 mg/dL — ABNORMAL HIGH (ref 0.70–1.28)
Glucose, Bld: 125 mg/dL — ABNORMAL HIGH (ref 65–99)
Potassium: 4.2 mmol/L (ref 3.5–5.3)
Sodium: 143 mmol/L (ref 135–146)
eGFR: 46 mL/min/{1.73_m2} — ABNORMAL LOW (ref >=60)

## 2023-02-16 ENCOUNTER — Encounter: Payer: Self-pay | Admitting: Cardiology

## 2023-02-16 ENCOUNTER — Ambulatory Visit: Payer: Medicare Other | Attending: Cardiology | Admitting: Cardiology

## 2023-02-16 VITALS — BP 90/50 | HR 56 | Ht 73.0 in | Wt 206.2 lb

## 2023-02-16 DIAGNOSIS — I4819 Other persistent atrial fibrillation: Secondary | ICD-10-CM

## 2023-02-16 DIAGNOSIS — R600 Localized edema: Secondary | ICD-10-CM | POA: Diagnosis not present

## 2023-02-16 DIAGNOSIS — R9430 Abnormal result of cardiovascular function study, unspecified: Secondary | ICD-10-CM

## 2023-02-16 DIAGNOSIS — I10 Essential (primary) hypertension: Secondary | ICD-10-CM | POA: Diagnosis not present

## 2023-02-16 MED ORDER — AMIODARONE HCL 200 MG PO TABS: ORAL_TABLET | ORAL | 0 refills | Status: AC

## 2023-02-16 MED ORDER — FUROSEMIDE 20 MG PO TABS: 40.0000 mg | ORAL_TABLET | Freq: Every day | ORAL | 2 refills | Status: DC

## 2023-02-16 MED ORDER — FUROSEMIDE 20 MG PO TABS: 60.0000 mg | ORAL_TABLET | Freq: Every day | ORAL | 3 refills | Status: DC

## 2023-02-16 NOTE — Progress Notes (Signed)
Cardiology Office Note:    Date:  02/16/2023   ID:  Cody Hines., DOB December 17, 1945, MRN [id number]  PCP:  Lucky Cowboy, MD   [facility] Providers Cardiologist:  Debbe Odea, MD     Referring MD: Lucky Cowboy, MD   Chief Complaint  Patient presents with   New Patient (Initial Visit)    Afib/edema c/o low BP, sob and bilateral leg pain. Meds reviewed verbally with pt.    History of Present Illness:    Cody Hines. is a 77 y.o. male with a hx of persistent atrial fibrillation s/p DC cardioversion 11/2022, hypertension, hyperlipidemia who presents due to atrial fibrillation and leg edema.  Diagnosed with fibrillation 5 months ago, required TEE guided cardioversion in the hospital 11/2022.  Was placed on Toprol-XL, Cardizem.  Compliant with Eliquis 5 mg twice daily, no significant bleeding.  Endorses lower extremity swelling over the past month or so.  Takes Lasix 20 mg daily, no significant improvement.  Blood pressures have been running low at home in the low 100s systolic, diastolic 50.  Losartan previously stopped.  Echocardiogram 10/2022 EF 45 to 50%,  Past Medical History:  Diagnosis Date   BPH (benign prostatic hyperplasia)    Fibromyalgia    GERD (gastroesophageal reflux disease)    Hyperlipidemia    Hypertension    Hypogonadism male    IBS (irritable bowel syndrome)    OSA (obstructive sleep apnea)     Past Surgical History:  Procedure Laterality Date    nasal smr np3  1985   BUBBLE STUDY  11/07/2022   Procedure: BUBBLE STUDY;  Surgeon: Orpah Cobb, MD;  Location: [facility];  Service: Cardiovascular;;   CARDIOVERSION N/A 11/07/2022   Procedure: CARDIOVERSION;  Surgeon: Orpah Cobb, MD;  Location: [facility] ENDOSCOPY;  Service: Cardiovascular;  Laterality: N/A;   CATARACT EXTRACTION, BILATERAL Bilateral 2021   Dr. Dione Booze, L 5/27, R 3/25   COLONOSCOPY     KNEE ARTHROSCOPY Left 1999   SKIN CANCER EXCISION  2020   nose    SPINE SURGERY  2007   L5 S 1 Disk   SPINE SURGERY  09/2019   Compression fracture stabilization by Dr. Lovell Sheehan   TEE WITHOUT CARDIOVERSION N/A 11/07/2022   Procedure: TRANSESOPHAGEAL ECHOCARDIOGRAM (TEE);  Surgeon: Orpah Cobb, MD;  Location: [facility]COPY;  Service: Cardiovascular;  Laterality: N/A;   VASECTOMY  1983    Current Medications: Current Meds  Medication Sig   acetaminophen (EQ 8HR ARTHRITIS PAIN RELIEF) 650 MG CR tablet 1 TABLET EVERY 6 HOURS (route: BY MOUTH)   acetaminophen (TYLENOL) 650 MG CR tablet Take 650 mg by mouth every 8 (eight) hours as needed for pain (or back pain or migraines).   amiodarone (PACERONE) 200 MG tablet Take 1 tablet (200 mg total) by mouth 2 (two) times daily for 7 days, THEN 1 tablet (200 mg total) daily.   apixaban (ELIQUIS) 5 MG TABS tablet Take 1 tablet (5 mg total) by mouth 2 (two) times daily.   Cholecalciferol (VITAMIN D3) 50 MCG (2000 UT) TABS 1 capsule DAILY (route: oral)   Flaxseed, Linseed, (FLAX SEED OIL PO) Take by mouth.   LORazepam (ATIVAN) 1 MG tablet TAKE 1 TABLET BY MOUTH EACH NIGHT AT BEDTIME FOR SLEEP   losartan (COZAAR) 25 MG tablet Take 1 tablet (25 mg total) by mouth daily.   Magnesium 400 MG TABS 1 capsule DAILY (route: oral)   methocarbamol (ROBAXIN) 500 MG tablet TAKE 1 TABLET BY MOUTH EVERY  6 HOURS AS NEEDED FOR MUSCLE SPASMS   metoprolol succinate (TOPROL XL) 50 MG 24 hr tablet Take 1 tablet (50 mg total) by mouth daily. Take with or immediately following a meal.   Multiple Vitamin (MULTIVITAMIN) capsule Take 1 capsule by mouth daily.   pantoprazole (PROTONIX) 40 MG tablet Take 1 tablet (40 mg total) by mouth daily.   psyllium (METAMUCIL) 58.6 % packet Take 1 packet by mouth daily.   rosuvastatin (CRESTOR) 40 MG tablet Take  1 tablet  Daily  for Cholesterol   sertraline (ZOLOFT) 100 MG tablet Take 1 tablet (100 mg total) by mouth in the morning and at bedtime.   [DISCONTINUED] diltiazem (CARDIZEM CD) 180 MG 24 hr capsule  Take 1 capsule (180 mg total) by mouth daily.   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  Tobacco Use   Smoking status: Former    Types: Cigarettes   Smokeless tobacco: Never  Building services engineer Use: Never used  Substance and Sexual Activity   Alcohol use: Not Currently   Drug use: Never   Sexual activity: Not Currently  Other Topics Concern   Not on file  Social History Narrative   ** Merged History Encounter **       Social Determinants of Health   Financial Resource Strain: Not on file  Food Insecurity: No Food Insecurity (11/05/2022)   Hunger Vital Sign    Worried About Running Out of Food in the Last Year: Never true    Ran Out of Food in the Last Year: Never true  Transportation Needs: No Transportation Needs (11/05/2022)   PRAPARE - Administrator, Civil Service (Medical): No    Lack of Transportation (Non-Medical): No  Physical Activity: Not on file  Stress: Not on file  Social Connections: Not on file     Family History: The patient's family history includes Arthritis in his sister; Atrial fibrillation in his sister, sister, sister, and sister; Cancer in his father and mother; Diabetes in his father; Heart attack in his father; Heart disease in his sister. There is no history of Colon cancer, Colon polyps, Sleep apnea, Esophageal cancer, Pancreatic cancer, or Stomach cancer.  ROS:   Please see the history of present illness.     All other systems reviewed and are  negative.  EKGs/Labs/Other Studies Reviewed:    The following studies were reviewed today:   EKG:  EKG is  ordered today.  The ekg ordered today demonstrates sinus bradycardia heart rate 56.  Recent Labs: 11/04/2022: TSH 2.068 11/08/2022: Magnesium 2.0 12/08/2022: ALT 15; Hemoglobin 13.3; Platelets 191.0 02/11/2023: BUN 10; Creat 1.57; Potassium 4.2; Sodium 143  Recent Lipid Panel    Component Value Date/Time   CHOL 125 11/04/2022 0000   TRIG 60 11/04/2022 0000   HDL 51 11/04/2022 0000   CHOLHDL 2.5 11/04/2022 0000   VLDL 33 (H) 04/24/2017 1153   LDLCALC 60 11/04/2022 0000     Risk Assessment/Calculations:  Physical Exam:    VS:  BP (!) 90/50 (BP Location: Right Arm, Patient Position: Sitting, Cuff Size: Large)   Pulse (!) 56   Ht 6\' 1"  (1.854 m)   Wt 206 lb 4 oz (93.6 kg)   SpO2 98%   BMI 27.21 kg/m     Wt Readings from Last 3 Encounters:  02/16/23 206 lb 4 oz (93.6 kg)  02/11/23 204 lb 6.4 oz (92.7 kg)  02/04/23 206 lb 3.2 oz (93.5 kg)     GEN:  Well nourished, well developed in no acute distress HEENT: Normal NECK: No JVD; No carotid bruits CARDIAC: RRR, no murmurs, rubs, gallops RESPIRATORY:  Clear to auscultation without rales, wheezing or rhonchi  ABDOMEN: Soft, non-tender, non-distended MUSCULOSKELETAL:  1-2+ edema; No deformity  SKIN: Warm and dry NEUROLOGIC:  Alert and oriented x 3 PSYCHIATRIC:  Normal affect   ASSESSMENT:    1. Persistent atrial fibrillation (HCC)   2. Primary hypertension   3. Leg edema   4. Ejection fraction < 50%    PLAN:    In order of problems listed above:  Persistent atrial fibrillation, s/p DC cardioversion 11/2022.  Maintaining sinus rhythm.  BP low normal, leg edema, EF 45%.  Stop Cardizem, start amiodarone 200 twice daily x 7 days, 200 mg daily.  Continue Eliquis, Toprol-XL.  Refer to A-fib clinic Hypertension, BP low normal.  I agree with stopping losartan.  Stop Cardizem as above.  Continue Toprol-XL.   Monitor BP. Bilateral leg edema, increase Lasix to 40 mg daily, check BMP in 10 days. Mildly reduced EF, possibly tach induced from A-fib.  Repeat echo.  Continue Toprol-XL.   Follow-up after echo.       Medication Adjustments/Labs and Tests Ordered: Current medicines are reviewed at length with the patient today.  Concerns regarding medicines are outlined above.  Orders Placed This Encounter  Procedures   Basic Metabolic Panel (BMET)   Ambulatory referral to Cardiac Electrophysiology   EKG 12-Lead   ECHOCARDIOGRAM COMPLETE   Meds ordered this encounter  Medications   amiodarone (PACERONE) 200 MG tablet    Sig: Take 1 tablet (200 mg total) by mouth 2 (two) times daily for 7 days, THEN 1 tablet (200 mg total) daily.    Dispense:  104 tablet    Refill:  0   furosemide (LASIX) 20 MG tablet    Sig: Take 2 tablets (40 mg total) by mouth daily. Take 2 tabs x 2 days and then 1 tab daily first thing in the morning    Dispense:  60 tablet    Refill:  2    Patient Instructions  Medication Instructions:   START Amiodarone - Take 1 tablet (200 mg total) by mouth 2 (two) times daily for 7 days, THEN 1 tablet (200 mg total) daily. INCREASE lasix - take two tablets (40mg ) by mouth daily.  STOP Cardizem  *If you need a refill on your cardiac medications before your next appointment, please call your pharmacy*   Lab Work:  Your physician recommends that you return for lab work in: 10 days at the medical mall.  No appt is needed. Hours are M-F 7AM- 6 PM.  If you have labs (blood work) drawn today and your tests are completely normal, you will receive your results only by: MyChart Message (if you have MyChart) OR A paper copy in the mail If you have any lab test that is abnormal or we need to change your treatment, we will call you  to review the results.   Testing/Procedures:  Your physician has requested that you have an echocardiogram. Echocardiography is a painless test that  uses sound waves to create images of your heart. It provides your doctor with information about the size and shape of your heart and how well your heart's chambers and valves are working. This procedure takes approximately one hour. There are no restrictions for this procedure. Please do NOT wear cologne, perfume, aftershave, or lotions (deodorant is allowed). Please arrive 15 minutes prior to your appointment time.    Follow-Up: At [facility], you and your health needs are our priority.  As part of our continuing mission to provide you with exceptional heart care, we have created designated Provider Care Teams.  These Care Teams include your primary Cardiologist (physician) and Advanced Practice Providers (APPs -  Physician Assistants and Nurse Practitioners) who all work together to provide you with the care you need, when you need it.  We recommend signing up for the patient portal called "MyChart".  Sign up information is provided on this After Visit Summary.  MyChart is used to connect with patients for Virtual Visits (Telemedicine).  Patients are able to view lab/test results, encounter notes, upcoming appointments, etc.  Non-urgent messages can be sent to your provider as well.   To learn more about what you can do with MyChart, go to [email or web address].    Your next appointment:   3 month(s)  Provider:   You may see Debbe Odea, MD or one of the following Advanced Practice Providers on your designated Care Team:   Nicolasa Ducking, NP Eula Listen, PA-C Cadence Fransico Michael, PA-C Charlsie Quest, NP   Signed, Debbe Odea, MD  02/16/2023 12:08 PM    [facility]

## 2023-02-16 NOTE — Addendum Note (Signed)
Addended by: Margrett Rud on: 02/16/2023 03:27 PM   Modules accepted: Orders

## 2023-02-16 NOTE — Patient Instructions (Signed)
Medication Instructions:   START Amiodarone - Take 1 tablet (200 mg total) by mouth 2 (two) times daily for 7 days, THEN 1 tablet (200 mg total) daily. INCREASE lasix - take two tablets (40mg ) by mouth daily.  STOP Cardizem  *If you need a refill on your cardiac medications before your next appointment, please call your pharmacy*   Lab Work:  Your physician recommends that you return for lab work in: 10 days at the medical mall.  No appt is needed. Hours are M-F 7AM- 6 PM.  If you have labs (blood work) drawn today and your tests are completely normal, you will receive your results only by: MyChart Message (if you have MyChart) OR A paper copy in the mail If you have any lab test that is abnormal or we need to change your treatment, we will call you to review the results.   Testing/Procedures:  Your physician has requested that you have an echocardiogram. Echocardiography is a painless test that uses sound waves to create images of your heart. It provides your doctor with information about the size and shape of your heart and how well your heart's chambers and valves are working. This procedure takes approximately one hour. There are no restrictions for this procedure. Please do NOT wear cologne, perfume, aftershave, or lotions (deodorant is allowed). Please arrive 15 minutes prior to your appointment time.    Follow-Up: At [facility], you and your health needs are our priority.  As part of our continuing mission to provide you with exceptional heart care, we have created designated Provider Care Teams.  These Care Teams include your primary Cardiologist (physician) and Advanced Practice Providers (APPs -  Physician Assistants and Nurse Practitioners) who all work together to provide you with the care you need, when you need it.  We recommend signing up for the patient portal called "MyChart".  Sign up information is provided on this After Visit Summary.  MyChart is used to  connect with patients for Virtual Visits (Telemedicine).  Patients are able to view lab/test results, encounter notes, upcoming appointments, etc.  Non-urgent messages can be sent to your provider as well.   To learn more about what you can do with MyChart, go to [email or web address].    Your next appointment:   3 month(s)  Provider:   You may see Debbe Odea, MD or one of the following Advanced Practice Providers on your designated Care Team:   Nicolasa Ducking, NP Eula Listen, PA-C Cadence Fransico Michael, PA-C Charlsie Quest, NP

## 2023-02-23 ENCOUNTER — Other Ambulatory Visit: Payer: Self-pay

## 2023-02-27 ENCOUNTER — Ambulatory Visit (INDEPENDENT_AMBULATORY_CARE_PROVIDER_SITE_OTHER): Payer: Medicare Other | Admitting: Psychiatry

## 2023-02-27 DIAGNOSIS — F411 Generalized anxiety disorder: Secondary | ICD-10-CM

## 2023-02-27 NOTE — Progress Notes (Signed)
[facility] Counselor/Therapist Progress Note  Patient ID: Cody Philmore Tyonne Puertas., MRN: [id number],    Date: 02/27/2023  Time Spent: 45 minutes   Treatment Type: Individual Therapy  Reported Symptoms: anxiety, concerned also about "a possible heart issue that he is being checked out for"  Mental Status Exam:  Appearance:   Casual     Behavior:  Appropriate and Sharing  Motor:  Uses cane to walk, not quite as stable on his feet and the cane is very helpful  Speech/Language:   Clear and Coherent  Affect:  Appropriate  Mood:  Anxious, some depression  Thought process:  goal directed  Thought content:    Rumination  Sensory/Perceptual disturbances:    WNL  Orientation:  oriented to stated date of Feb 27, 2023  Attention:  Good, sometimes Fair  Concentration:  Good and Fair  Memory:  "Memory not quite as good" and Dr is aware  Fund of knowledge:   Good  Insight:    Good and Fair  Judgment:   Good and Fair  Impulse Control:  Good   Risk Assessment: Danger to Self:  No Self-injurious Behavior: No Danger to Others: No Duty to Warn:no Physical Aggression / Violence:No  Access to Firearms a concern: No  Gang Involvement:No   Subjective:   Patient in today reporting anxiety and depression, mostly related to some aging, not hearing as well, and being some less physically steady. Continues to be careful in walking and moving about so as to prevent falls. Is in frequent contact with his adult children who help him frequently with health-related issues. Reports being followed medically for his Afib and had 2 appts recently. Reports some blood pressure issue recently and in contact with his Dr for that and is staying on his meds regularly. Denies any SI. Encouraged his contact with family and friends who are concerned about patient and are good about including patient in activities. Discussed what helps him in better managing his anxiety and depression which includes being "with my  dog", family contacts, friend contacts, talking with son, watching TV especially sports. Encouraged his remaining on his meds as prescribed, eating healthy, walking and moving about as he can safely do, and reaching out to others especially when he notices feeling more "down. To continue working on not overly focusing on negatives and look for more positives and spending time with positive people that care for him.   Interventions: Cognitive Behavioral Therapy and Ego-Supportive  Long term goal: Develop healthy cognitive patterns and beliefs about self and the world that lead to alleviation and help prevent relapse of depression. Short term goal: Identify and replace depressive thinking that leads to depressive feelings and actions.  He will replace the depressive thinking with more positive, hopeful thoughts that can lead to more positive and hopeful feelings. Strategy: Patient to more consistently monitor his thoughts and catch the depressive/negative thoughts, trying to change them to positive and more reality-based thought patterns. Look at his negative thoughts and weigh them against the evidence.   Diagnosis:   ICD-10-CM   1. Generalized anxiety disorder  F41.1      Plan:  Patient today participating well in session although showing a little more challenges physically but he does use his cane and is careful moving about at least when we see him here.  Family and friends are good about checking in on him both by phone and in person.  Patient is making progress as he is able, and  needs to continue working with goal-directed behaviors to maintain his gains and move in a forward direction. Encouraged patient and his practice of more positive/self affirming behaviors as noted in session including: Staying in contact with people who care about him and are supportive, wearing his hearing aids more often as they definitely make a difference in his interactions with other people for the better, initiate  interaction with adult children and grandchildren and sister regularly, using his cane when needed for stability per doctor recommendations, encouraging self talk similar to examples that we discussed in session, getting outside daily as he is able with Dr. Martyn Ehrich, remain focused in the present and what he can change or control versus what he cannot, focus more on enjoying situations in the present rather than rehashing a lot of negative issues from the past and overly focusing on them, remain on his prescribed medication, take advantage of opportunities he has to be with other people that care about him including community/family/church, and recognize the strength he shows working with goal-directed behaviors to move in a direction that supports his improved emotional health and overall wellbeing.  Goal review and progress/challenges noted with patient.  Next appointment within 3 to 4 weeks.   Mathis Fare, LCSW

## 2023-03-03 ENCOUNTER — Encounter: Payer: Self-pay | Admitting: Internal Medicine

## 2023-03-03 NOTE — Patient Instructions (Signed)

## 2023-03-03 NOTE — Progress Notes (Unsigned)
Annual  Screening/Preventative Visit  & Comprehensive Evaluation & Examination   Future Appointments  Date Time Provider Department  03/04/2023 10:00 AM Lucky Cowboy, MD [facility]  03/25/2023  1:00 PM Mathis Fare, LCSW [facility]  04/01/2023  8:40 AM Lanier Prude, MD [facility]  05/25/2023  9:00 AM Debbe Odea, MD [facility]  06/18/2023 10:00 AM Butch Penny, NP [facility]             This very nice 77 y.o.  separated WM with HTN, ASHD/pAfib, Alcoholic Liver Dz,  HLD, Prediabetes and Vitamin D Deficiency presents for a Screening /Preventative Visit & comprehensive evaluation and management of multiple medical co-morbidities.  Patient is followed by Dr Dohmeier & Butch Penny, NP  for mild cognitive impairment and OSA on CPAP.  Patient is still in psych therapy for counseling for Major Depression consequent of his marital separation and Alcoholism.   CT Scan in 2020 showed Aortic atherosclerosis.        HTN predates since  64. Patient's BP has been controlled at home.  I Jan 2024, patient was hospitalized with Afib, had CV to NSR & was started on Eliquis.  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 Today's BP is at goal -   102/52 .   Patient denies any cardiac symptoms as chest pain, palpitations, shortness of breath, dizziness , but doesc/o persistent leg /ankle  swelling despite increased dose of lasix .       Patient's hyperlipidemia is controlled with diet and Rosuvastatin. Patient denies myalgias or other medication SE's. Last lipids were at goal :  Lab Results  Component Value Date   CHOL 125 11/04/2022   HDL 51 11/04/2022   LDLCALC 60 11/04/2022   TRIG 60 11/04/2022   CHOLHDL 2.5 11/04/2022         Patient has hx/o prediabetes (A1c 6.0% /2011) and patient denies reactive hypoglycemic symptoms, visual blurring, diabetic polys or paresthesias. Last A1c was normal & at goal :   Lab Results  Component Value Date   HGBA1C 5.5 11/04/2022          Finally, patient has history of Vitamin D Deficiency ("29" /2008) and last vitamin D was still very  low  :   Lab Results  Component Value Date   VD25OH 34 07/24/2022       Current Outpatient Medications on File Prior to Visit  Medication Sig   acetaminophen 650 MG CR tablet Take 650 mg every 8  hours as needed for pain   aspirin EC 81 MG tablet Take daily   enalapril  20 MG tablet TAKE 1 TABLET  DAILY    LORazepam 1 MG tablet Take 1 tablet  at bedtime as needed for sleep.   methocarbamol 500 MG tablet Take 1 tablet every 6  hours as needed   Multiple Vitamin  Take 1 capsule th daily.   rosuvastatin 40 MG tablet TAKE 1 TABLET DAILY   sertraline 100 MG tablet Take 1 tablet in the morning and at bedtime.    Allergies  Allergen Reactions   Asa [Aspirin] Diarrhea, Nausea  and Hx/o  ulcers   Atorvastatin Muscle aches   Celebrex [Celecoxib] Headaches   Codeine Nausea And Vomiting   Savella [Milnacipran Hcl] not recalled   Savella [Milnacipran] not recalled   Viagra [Sildenafil Citrate] Headaches   Viagra [Sildenafil] Headaches   Penicillins Rash and      Past Medical History:  Diagnosis Date   BPH (benign  prostatic hyperplasia)    Fibromyalgia    GERD (gastroesophageal reflux disease)    Hyperlipidemia    Hypertension    Hypogonadism male    IBS (irritable bowel syndrome)    OSA (obstructive sleep apnea)      Health Maintenance  Topic Date Due   Zoster Vaccines- Shingrix (1 of 2) Never done   COVID-19 Vaccine  01/08/2020   TETANUS/TDAP  01/14/2020   INFLUENZA VACCINE  05/06/2022   Pneumonia Vaccine 18+ Years old  Completed   Hepatitis C Screening  Completed   HPV VACCINES  Aged Out     Immunization History  Administered Date(s) Administered   PFIZER SARS-COV-2 Vacc 11/19/2019, 12/11/2019   Pneumococcal -13 09/17/2018   Pneumococcal -23 09/26/2020   Pneumococcal -23 07/16/2011   Tdap 01/13/2010   Zoster, Live 08/09/2013     Last EGD & Colon -   04/22/2022 -  Dr Adela Lank recommended No F/U due to age    Past Surgical History:  Procedure Laterality Date    nasal smr np3  1985   CATARACT EXTRACTION, BILATERAL Bilateral 2021   Dr. Dione Booze, L 5/27, R 3/25   KNEE ARTHROSCOPY Left 1999   SKIN CANCER EXCISION  2020   nose   SPINE SURGERY  2007   L5 S 1 Disk   SPINE SURGERY  09/2019   Compression fracture stabilization by Dr. Lovell Sheehan   VASECTOMY  1983     Family History  Problem Relation Age of Onset   Cancer Mother        breast   Cancer Father        lung   Diabetes Father    Heart disease Sister    Arthritis Sister      Social History   Socioeconomic History   Marital status: Separated or   ?  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 Does c/o fatigue. Eyes: Denies redness, blurred vision, diplopia, discharge, itchy or watery eyes.  ENT: Denies discharge, congestion, post nasal drip, epistaxis, sore throat, earache, hearing loss, dental pain, Tinnitus, Vertigo, Sinus pain or snoring.  Cardio: Denies chest pain, palpitations, irregular heartbeat, syncope, dyspnea, diaphoresis, orthopnea, PND, claudication or edema Respiratory: denies cough, dyspnea, DOE, pleurisy, hoarseness, laryngitis or wheezing.  Gastrointestinal: Denies dysphagia, heartburn, reflux, water brash, pain, cramps, nausea, vomiting, bloating, diarrhea, constipation, hematemesis, melena, hematochezia, jaundice or hemorrhoids Genitourinary: Denies dysuria, frequency, urgency, nocturia, hesitancy, discharge, hematuria or flank pain Musculoskeletal: Denies arthralgia, myalgia, stiffness, Jt. Swelling, pain, limp or  strain/sprain. Denies Falls. Skin: Denies puritis, rash, hives, warts, acne, eczema or change in skin lesion Neuro: No weakness, tremor, incoordination, spasms, paresthesia or pain Psychiatric: Denies confusion, memory loss or sensory loss. Denies Depression. Endocrine: Denies change in weight, skin, hair change, nocturia, and paresthesia, diabetic polys, visual blurring or hyper / hypo glycemic episodes.  Heme/Lymph: No excessive bleeding, bruising or enlarged lymph nodes.   Physical Exam  BP (!) 102/52   Pulse 69   Temp 97.7 F (36.5 C)   Ht 5\' 9"  (1.753 m)   Wt 205 lb 9.6 oz (93.3 kg)  SpO2 96%   BMI 30.36 kg/m   General Appearance: Well nourished and in no apparent distress.  Eyes: PERRLA, EOMs, conjunctiva no swelling or erythema, normal fundi and vessels. Sinuses: No frontal/maxillary tenderness ENT/Mouth: EACs patent / TMs  nl. Nares clear without erythema, swelling, mucoid exudates. Oral hygiene is good. No erythema, swelling, or exudate. Tongue normal, non-obstructing. Tonsils not swollen or erythematous. Hearing normal.  Neck: Supple, thyroid not palpable. No bruits, nodes or JVD. Respiratory: Respiratory effort normal.  BS equal and clear bilateral without rales, rhonci, wheezing or stridor. Cardio: Heart sounds are normal with regular rate and rhythm and no murmurs, rubs or gallops. Peripheral pulses are are obscured by  2 (+) pretibial & ankle edema edema. No aortic or femoral bruits. Chest: symmetric with normal excursions and percussion.  Abdomen: Soft, with Nl bowel sounds. Non-tender, no guarding, rebound, hernias, masses, or organomegaly.  Lymphatics: Non tender without lymphadenopathy.  Musculoskeletal: Full ROM all peripheral extremities, joint stability, 5/5 strength, and normal gait. Skin: Warm and dry without rashes, lesions, cyanosis, clubbing or  ecchymosis.  Neuro: Cranial nerves intact, reflexes equal bilaterally. High freq low amplitude fine tremor of  hands. Normal muscle tone, no cerebellar symptoms. Sensation intact.  Pysch: Alert and oriented x 3 with flat affect  & limited insight and judgment appropriate.   Assessment and Plan  1. Annual Preventative/Screening Exam    2. Essential hypertension  - EKG 12-Lead - [place], RETROPERITNL ABD,  LTD - Urinalysis, Routine w reflex microscopic - Microalbumin / creatinine urine ratio - CBC with Differential/Platelet - COMPLETE METABOLIC PANEL WITH GFR - Magnesium - TSH   3. Hyperlipidemia, mixed  - EKG 12-Lead - [place], RETROPERITNL ABD,  LTD - Lipid panel - TSH   4. Abnormal glucose  - EKG 12-Lead - [place], RETROPERITNL ABD,  LTD - Hemoglobin A1c - Insulin, random  5. Vitamin D deficiency  - VITAMIN D 25 Hydroxy     6. Paroxysmal atrial fibrillation (HCC)  - EKG 12-Lead - TSH   7. Aortic atherosclerosis (HCC)  - EKG 12-Lead - [place], RETROPERITNL ABD,  LTD - Lipid panel   8. Major depressive disorder, recurrent episode, moderate (HCC)  - TSH   9. OSA on CPAP   10. Stage 3a chronic kidney disease (HCC)  - Urinalysis, Routine w reflex microscopic - Microalbumin / creatinine urine ratio - PTH, intact and calcium - COMPLETE METABOLIC PANEL WITH GFR   11. Gastroesophageal reflux disease  - POC Hemoccult Bld/Stl (3-Cd Home Screen); Future - CBC with Differential/Platelet   12. Benign essential tremor   13. MCI (mild cognitive impairment)   14. Screening for colorectal cancer  - POC Hemoccult Bld/Stl   15. Screening for heart disease  - EKG 12-Lead   16. FHx: heart disease  - EKG 12-Lead - [place], RETROPERITNL ABD,  LTD   17. Former smoker  - EKG 12-Lead - [place], RETROPERITNL ABD,  LTD   18. Screening for AAA (aortic abdominal aneurysm)  - [place], RETROPERITNL ABD,  LTD   19. Medication management  - Urinalysis, Routine w reflex microscopic - Microalbumin / creatinine urine ratio - CBC with Differential/Platelet - COMPLETE METABOLIC PANEL  WITH GFR - Magnesium - Lipid panel - TSH - Hemoglobin A1c - Insulin, random - VITAMIN D 25 Hydroxy           Patient was counseled in prudent diet, weight control to achieve/maintain BMI less than 25, BP monitoring, regular exercise and medications as discussed.  Discussed med  effects and SE's. Routine screening labs and tests as requested with regular follow-up as recommended. Over 40 minutes of exam, counseling, chart review and high complex critical decision making was performed   Marinus Maw, MD

## 2023-03-04 ENCOUNTER — Ambulatory Visit (INDEPENDENT_AMBULATORY_CARE_PROVIDER_SITE_OTHER): Payer: Medicare Other | Admitting: Internal Medicine

## 2023-03-04 ENCOUNTER — Encounter: Payer: Self-pay | Admitting: Internal Medicine

## 2023-03-04 VITALS — BP 102/52 | HR 69 | Temp 97.7°F | Ht 69.0 in | Wt 205.6 lb

## 2023-03-04 DIAGNOSIS — K219 Gastro-esophageal reflux disease without esophagitis: Secondary | ICD-10-CM

## 2023-03-04 DIAGNOSIS — I480 Paroxysmal atrial fibrillation: Secondary | ICD-10-CM

## 2023-03-04 DIAGNOSIS — I10 Essential (primary) hypertension: Secondary | ICD-10-CM

## 2023-03-04 DIAGNOSIS — Z0000 Encounter for general adult medical examination without abnormal findings: Secondary | ICD-10-CM

## 2023-03-04 DIAGNOSIS — Z87891 Personal history of nicotine dependence: Secondary | ICD-10-CM

## 2023-03-04 DIAGNOSIS — Z0001 Encounter for general adult medical examination with abnormal findings: Secondary | ICD-10-CM

## 2023-03-04 DIAGNOSIS — E559 Vitamin D deficiency, unspecified: Secondary | ICD-10-CM

## 2023-03-04 DIAGNOSIS — N1831 Chronic kidney disease, stage 3a: Secondary | ICD-10-CM

## 2023-03-04 DIAGNOSIS — I700 Atherosclerosis of aorta: Secondary | ICD-10-CM | POA: Diagnosis not present

## 2023-03-04 DIAGNOSIS — F331 Major depressive disorder, recurrent, moderate: Secondary | ICD-10-CM

## 2023-03-04 DIAGNOSIS — R7309 Other abnormal glucose: Secondary | ICD-10-CM

## 2023-03-04 DIAGNOSIS — Z8249 Family history of ischemic heart disease and other diseases of the circulatory system: Secondary | ICD-10-CM

## 2023-03-04 DIAGNOSIS — Z136 Encounter for screening for cardiovascular disorders: Secondary | ICD-10-CM

## 2023-03-04 DIAGNOSIS — Z79899 Other long term (current) drug therapy: Secondary | ICD-10-CM

## 2023-03-04 DIAGNOSIS — Z1211 Encounter for screening for malignant neoplasm of colon: Secondary | ICD-10-CM

## 2023-03-04 DIAGNOSIS — E782 Mixed hyperlipidemia: Secondary | ICD-10-CM

## 2023-03-04 DIAGNOSIS — G250 Essential tremor: Secondary | ICD-10-CM

## 2023-03-04 DIAGNOSIS — G3184 Mild cognitive impairment, so stated: Secondary | ICD-10-CM

## 2023-03-04 DIAGNOSIS — G4733 Obstructive sleep apnea (adult) (pediatric): Secondary | ICD-10-CM

## 2023-03-04 MED ORDER — TORSEMIDE 20 MG PO TABS: ORAL_TABLET | ORAL | 1 refills | Status: DC

## 2023-03-05 ENCOUNTER — Other Ambulatory Visit: Payer: Self-pay | Admitting: Internal Medicine

## 2023-03-05 DIAGNOSIS — E876 Hypokalemia: Secondary | ICD-10-CM

## 2023-03-05 DIAGNOSIS — D649 Anemia, unspecified: Secondary | ICD-10-CM

## 2023-03-05 LAB — CBC WITH DIFFERENTIAL/PLATELET
Absolute Monocytes: 282 cells/uL (ref 200–950)
Basophils Absolute: 28 cells/uL (ref 0–200)
Basophils Relative: 0.6 %
Eosinophils Absolute: 193 cells/uL (ref 15–500)
Eosinophils Relative: 4.1 %
HCT: 33.8 % — ABNORMAL LOW (ref 38.5–50.0)
Hemoglobin: 11.2 g/dL — ABNORMAL LOW (ref 13.2–17.1)
Lymphs Abs: 1866 cells/uL (ref 850–3900)
MCH: 30.2 pg (ref 27.0–33.0)
MCHC: 33.1 g/dL (ref 32.0–36.0)
MCV: 91.1 fL (ref 80.0–100.0)
MPV: 10.5 fL (ref 7.5–12.5)
Monocytes Relative: 6 %
Neutro Abs: 2331 cells/uL (ref 1500–7800)
Neutrophils Relative %: 49.6 %
Platelets: 124 10*3/uL — ABNORMAL LOW (ref 140–400)
RBC: 3.71 10*6/uL — ABNORMAL LOW (ref 4.20–5.80)
RDW: 13.2 % (ref 11.0–15.0)
Total Lymphocyte: 39.7 %
WBC: 4.7 10*3/uL (ref 3.8–10.8)

## 2023-03-05 LAB — URINALYSIS, ROUTINE W REFLEX MICROSCOPIC
Bilirubin Urine: NEGATIVE
Glucose, UA: NEGATIVE
Hgb urine dipstick: NEGATIVE
Ketones, ur: NEGATIVE
Leukocytes,Ua: NEGATIVE
Nitrite: NEGATIVE
Protein, ur: NEGATIVE
Specific Gravity, Urine: 1.012 (ref 1.001–1.035)
pH: 6 (ref 5.0–8.0)

## 2023-03-05 LAB — COMPLETE METABOLIC PANEL WITH GFR
AG Ratio: 1.4 (calc) (ref 1.0–2.5)
ALT: 15 U/L (ref 9–46)
AST: 31 U/L (ref 10–35)
Albumin: 3.3 g/dL — ABNORMAL LOW (ref 3.6–5.1)
Alkaline phosphatase (APISO): 100 U/L (ref 35–144)
BUN/Creatinine Ratio: 5 (calc) — ABNORMAL LOW (ref 6–22)
BUN: 8 mg/dL (ref 7–25)
CO2: 29 mmol/L (ref 20–32)
Calcium: 8.9 mg/dL (ref 8.6–10.3)
Chloride: 105 mmol/L (ref 98–110)
Creat: 1.62 mg/dL — ABNORMAL HIGH (ref 0.70–1.28)
Globulin: 2.4 g/dL (calc) (ref 1.9–3.7)
Glucose, Bld: 229 mg/dL — ABNORMAL HIGH (ref 65–99)
Potassium: 3.4 mmol/L — ABNORMAL LOW (ref 3.5–5.3)
Sodium: 141 mmol/L (ref 135–146)
Total Bilirubin: 2.1 mg/dL — ABNORMAL HIGH (ref 0.2–1.2)
Total Protein: 5.7 g/dL — ABNORMAL LOW (ref 6.1–8.1)
eGFR: 44 mL/min/{1.73_m2} — ABNORMAL LOW (ref >=60)

## 2023-03-05 LAB — INSULIN, RANDOM: Insulin: 148.2 u[IU]/mL — ABNORMAL HIGH

## 2023-03-05 LAB — MICROALBUMIN / CREATININE URINE RATIO
Creatinine, Urine: 141 mg/dL (ref 20–320)
Microalb Creat Ratio: 4 mg/g creat (ref <30)
Microalb, Ur: 0.6 mg/dL

## 2023-03-05 LAB — LIPID PANEL
Cholesterol: 121 mg/dL (ref <200)
HDL: 43 mg/dL (ref >=40)
LDL Cholesterol (Calc): 62 mg/dL (calc)
Non-HDL Cholesterol (Calc): 78 mg/dL (calc) (ref <130)
Total CHOL/HDL Ratio: 2.8 (calc) (ref <5.0)
Triglycerides: 79 mg/dL (ref <150)

## 2023-03-05 LAB — MAGNESIUM: Magnesium: 2 mg/dL (ref 1.5–2.5)

## 2023-03-05 LAB — HEMOGLOBIN A1C
Hgb A1c MFr Bld: 5.2 % of total Hgb (ref <5.7)
Mean Plasma Glucose: 103 mg/dL
eAG (mmol/L): 5.7 mmol/L

## 2023-03-05 LAB — PTH, INTACT AND CALCIUM
Calcium: 8.9 mg/dL (ref 8.6–10.3)
PTH: 37 pg/mL (ref 16–77)

## 2023-03-05 LAB — VITAMIN D 25 HYDROXY (VIT D DEFICIENCY, FRACTURES): Vit D, 25-Hydroxy: 49 ng/mL (ref 30–100)

## 2023-03-05 LAB — TSH: TSH: 4.02 mIU/L (ref 0.40–4.50)

## 2023-03-05 NOTE — Progress Notes (Signed)
^<^<^<^<^<^<^<^<^<^<^<^<^<^<^<^<^<^<^<^<^<^<^<^<^<^<^<^<^<^<^<^<^<^<^<^<^ ^>^>^>^>^>^>^>^>^>^>^>>^>^>^>^>^>^>^>^>^>^>^>^>^>^>^>^>^>^>^>^>^>^>^>^>^>  -  Test results slightly outside the reference range are not unusual. If there is anything important, I will review this with you,  otherwise it is considered normal test values.  If you have further questions,  please do not hesitate to contact me at the office or via My Chart.   ^<^<^<^<^<^<^<^<^<^<^<^<^<^<^<^<^<^<^<^<^<^<^<^<^<^<^<^<^<^<^<^<^<^<^<^<^ ^>^>^>^>^>^>^>^>^>^>^>^>^>^>^>^>^>^>^>^>^>^>^>^>^>^>^>^>^>^>^>^>^>^>^>^>^  -  CBC shows mild anemia, suggest schedule a NV in 1 month to recheck.   ^<^<^<^<^<^<^<^<^<^<^<^<^<^<^<^<^<^<^<^<^<^<^<^<^<^<^<^<^<^<^<^<^<^<^<^<^ ^>^>^>^>^>^>^>^>^>^>^>^>^>^>^>^>^>^>^>^>^>^>^>^>^>^>^>^>^>^>^>^>^>^>^>^>^  -  Insulin-= 148  is elevated  and shows insulin resistance                             - which is  a sign of early diabetes and associated with   a 300 % greater risk for heart attacks, strokes, cancer &                                                                         Alzheimer type vascular dementia   - All this can be cured  and prevented with losing weight  ^<^<^<^<^<^<^<^<^<^<^<^<^<^<^<^<^<^<^<^<^<^<^<^<^<^<^<^<^<^<^<^<^<^<^<^<^ ^>^>^>^>^>^>^>^>^>^>^>^>^>^>^>^>^>^>^>^>^>^>^>^>^>^>^>^>^>^>^>^>^>^>^>^>^  -  Chol = 121 - Great !   ^<^<^<^<^<^<^<^<^<^<^<^<^<^<^<^<^<^<^<^<^<^<^<^<^<^<^<^<^<^<^<^<^<^<^<^<^ ^>^>^>^>^>^>^>^>^>^>^>^>^>^>^>^>^>^>^>^>^>^>^>^>^>^>^>^>^>^>^>^>^>^>^>^>^  - A1c = 5.2% - Great ! - No Diabetes  !   ^<^<^<^<^<^<^<^<^<^<^<^<^<^<^<^<^<^<^<^<^<^<^<^<^<^<^<^<^<^<^<^<^<^<^<^<^ ^>^>^>^>^>^>^>^>^>^>^>^>^>^>^>^>^>^>^>^>^>^>^>^>^>^>^>^>^>^>^>^>^>^>^>^>^  -  Vitamin D = 49 - slightly low   ^<^<^<^<^<^<^<^<^<^<^<^<^<^<^<^<^<^<^<^<^<^<^<^<^<^<^<^<^<^<^<^<^<^<^<^<^ ^>^>^>^>^>^>^>^>^>^>^>^>^>^>^>^>^>^>^>^>^>^>^>^>^>^>^>^>^>^>^>^>^>^>^>^>^  -  All Else - CBC - Kidneys -  Electrolytes - Liver - Magnesium & Thyroid    - all  Normal / OK ^<^<^<^<^<^<^<^<^<^<^<^<^<^<^<^<^<^<^<^<^<^<^<^<^<^<^<^<^<^<^<^<^<^<^<^<^ ^>^>^>^>^>^>^>^>^>^>^>^>^>^>^>^>^>^>^>^>^>^>^>^>^>^>^>^>^>^>^>^>^>^>^>^>^  -

## 2023-03-25 ENCOUNTER — Ambulatory Visit (INDEPENDENT_AMBULATORY_CARE_PROVIDER_SITE_OTHER): Payer: Medicare Other | Admitting: Psychiatry

## 2023-03-25 DIAGNOSIS — F411 Generalized anxiety disorder: Secondary | ICD-10-CM | POA: Diagnosis not present

## 2023-03-25 MED ORDER — ROSUVASTATIN CALCIUM 40 MG PO TABS
ORAL_TABLET | ORAL | 3 refills | Status: DC
Start: 2023-03-25 — End: 2023-08-25

## 2023-03-25 NOTE — Progress Notes (Signed)
[facility] Counselor/Therapist Progress Note  Patient ID: Cody Philmore Soul Companion., MRN: [id number],    Date: 03/25/2023  Time Spent: 50 minutes  Treatment Type: Individual Therapy  Reported Symptoms:  anxiety, depression  Mental Status Exam:  Appearance:   Casual     Behavior:  Appropriate, Sharing, and Motivated  Motor:  Uses cane in walking  Speech/Language:   Clear and Coherent  Affect:  Anxious, some depression  Mood:  anxious and depressed  Thought process:  goal directed  Thought content:    Rumination  Sensory/Perceptual disturbances:    WNL  Orientation:  oriented to person, place, time/date, situation, day of week, month of year, year, and stated date of March 25, 2023  Attention:  Fair  Concentration:  Fair  Memory:  Some forgetfulness and short term memory issues and states Dr is aware  Fund of knowledge:   Fair  Insight:    Fair  Judgment:   Good  Impulse Control:  Good   Risk Assessment: Danger to Self:  No Self-injurious Behavior: No Danger to Others: No Duty to Warn:no Physical Aggression / Violence:No  Access to Firearms a concern: No  Gang Involvement:No   Subjective:   Patient in session today and reporting anxiety symptoms, along with some depression mostly related to aging that is affecting his hearing and "moving about".  He does report that he is being very careful when he moves about as he has fallen previously but not recently.  Discussed several issues involved in trying to prevent falls and ways he can help with that.  Also encouraged his more regular use of his hearing aids as "they do work and they do help".  Reports that he continues to be in frequent contact with his adult children who live within 1 to 2 hours of patient.  Son especially has been helpful to him and navigating services for health needs. Recent visit with son and grandson that went well as it always helps patient to have family visit. Patient reports recent appts re: his  Afib and states he "got new medicine". Processes some concerns and fears about the future, being alone but "doesn't have any interest in a retirement home, although his adult kids have encouraged him to consider such options rather that live alone. Patient denies any SI. Encouraged him to stay in touch with friends and family, and make healthy decisions for himself. Enjoys seeing grandkids, visiting other family and friends, and being with his dog.  Encouraged patient also as discussed earlier in session to focus more on the positives he experiences in situations and in relationships versus negatives as that tends to lead him in a downward spiral.  Interventions: Cognitive Behavioral Therapy and Ego-Supportive  Long term goal: Develop healthy cognitive patterns and beliefs about self and the world that lead to alleviation and help prevent relapse of depression. Short term goal: Identify and replace depressive thinking that leads to depressive feelings and actions.  He will replace the depressive thinking with more positive, hopeful thoughts that can lead to more positive and hopeful feelings. Strategy: Patient to more consistently monitor his thoughts and catch the depressive/negative thoughts, trying to change them to positive and more reality-based thought patterns. Look at his negative thoughts and weigh them against the evidence.   Diagnosis:   ICD-10-CM   1. Generalized anxiety disorder  F41.1      Plan:  Patient in today and show good participation in session as he worked further on  his anxiety and depression especially as it relates to his aloneness at times.  Encouraging his frequent contact with family, friends, neighbors, sister, and church friends.  Is having some more challenges physically but still but still living on his own with family and others being in contact with him.  Patient has made progress and needs to continue working with goal-directed behaviors to move in a forward  direction. Encouraged patient to be practicing more positive/self affirming behaviors as noted in session including: Wearing his hearing aids more often as they definitely make a difference in his interactions with other people for the better, initiate interaction with adult children and grandchildren and sister regularly, staying in contact with people who care about him and are supportive, using his cane when needed for stability per doctor recommendations, encouraging self talk similar to examples discussed in session, getting outside daily as he is able with doctors approval, remain focused in the present and what he can change or control versus what he cannot, focus more on enjoying situations in the present rather than rehashing a lot of negative issues from the past repeatedly and overly focusing on them, remain on his prescribed medication, take advantage of opportunities he has to be with family and other people that care about him including within the community and his church, and realize the strength he shows working with goal-directed behaviors to move in a direction that supports his improved emotional health and overall wellbeing.  Self rating scales: 1-10 depression scale-5 1-10 anxiety scale-5 1-10 self-esteem scale-6 1-10 motivation scale-5 1-10 hopefulness scale-5  Goal review and progress/challenges noted with patient.  Next appointment within 3 to 4 weeks.   Mathis Fare, LCSW

## 2023-03-25 NOTE — Progress Notes (Signed)
Future Appointments  Date Time Provider Department  03/26/2023 11:00 AM Lucky Cowboy, MD [facility]  04/02/2023  9:40 AM Lanier Prude, MD [facility]  04/27/2023 10:00 AM Mathis Fare, LCSW [facility]  05/25/2023  9:00 AM Debbe Odea, MD [facility]  06/12/2023                                   10:30 AM Adela Glimpse, NP [facility]  06/18/2023 10:00 AM Butch Penny, NP [facility]  09/15/2023                              6 mo  10:30 AM Lucky Cowboy, MD [facility]  12/16/2023                             wellness 10:30 AM Adela Glimpse, NP [facility]  03/23/2024                               cpe 10:00 AM Lucky Cowboy, MD [facility]    History of Present Illness:     The patient is a very  nice 77 y.o.  separated WM with HTN, ASHD/pAfib, Alcoholic Liver Dz,  HLD, Prediabetes, hx/o Major Depression  and Vitamin D Deficiency who returns for 3 week f/u after adding Demadex for worsening dependent edema, hypokalemia ( K 3.4)  and for f/u of "new" anemia    Current Outpatient Medications on File Prior to Visit  Medication Sig   amiodarone 200 MG tablet Take 1 tablet  daily.   ELIQUIS 5 MG TABS  Take 1 tablet 2 times daily.   VITAMIN D 2000 u 1 capsule DAILY    FLAX SEED OIL Take by mouth.   LORazepam  1 MG tablet TAKE 1 TABLET  AT BEDTIME    losartan  25 MG tablet Take 1 tablet  daily.   Magnesium 400 MG TABS 1 capsule DAILY    methocarbamol  500 MG tablet TAKE 1 TAB EVERY 6 HRS AS NEEDED   metoprolol succ  XL 50 MG  Take 1 tablet daily   Multiple Vitamin  Take 1 capsule daily.   rosuvastatin  40 MG tablet Take  1 tablet  Daily    sertraline  100 MG tablet Take 1 tablet morning and  bedtime.   torsemide  20 MG tablet Take 2 tablets every Morning      Allergies  Allergen Reactions   Asa [Aspirin] Diarrhea, Nausea Only and Other (See Comments)    History of ulcers, also   Atorvastatin Other (See Comments)    Muscle aches   Celebrex [Celecoxib] Other (See Comments)     Headaches    Codeine Nausea And Vomiting   Savella [Milnacipran Hcl] Other (See Comments)    Reaction not recalled   Viagra [Sildenafil Citrate] Other (See Comments)    Headaches   Penicillins Rash     Problem list He has Essential hypertension; GERD (gastroesophageal reflux disease); IBS (irritable bowel syndrome); Fibromyalgia; BPH (benign prostatic hyperplasia); Testosterone deficiency; Medication management; Vitamin D deficiency; Benign essential tremor; Chronic lumbar pain; Major depressive disorder, recurrent episode, moderate (HCC); Hyperlipidemia, mixed; Personal history of skin cancer; At moderate risk for fall; Compression fracture of T12 vertebra (HCC); Aortic atherosclerosis (HCC) by CT Scan on 06/22/2019;  Small right kidney; Closed L2 vertebral fracture (HCC); Closed L3 vertebral fracture (HCC); Contact with powered lawnmower as cause of accidental injury at home as place of occurrence; CKD (chronic kidney disease) stage 3, GFR 30-59 ml/min (HCC); OSA on CPAP; MCI (mild cognitive impairment); Pulmonary nodule; Malnutrition of moderate degree; A-fib (HCC); and Cirrhosis (HCC) on their problem list.   Observations/Objective:  BP 138/60   Pulse 100   Temp 98.5 F (36.9 C)   Resp 16   Ht 5\' 9"  (1.753 m)   Wt 204 lb (92.5 kg)   SpO2 97%   BMI 30.13 kg/m   HEENT - WNL. Neck - supple.  Chest - Clear equal BS. Cor - Nl HS. RRR w/o sig MGR. PP 1(+). No edema. MS- FROM w/o deformities.  Gait Nl. Neuro -  Nl w/o focal abnormalities.   Assessment and Plan:   1. Essential hypertension  - CBC with Differential/Platelet - Iron, TIBC and Ferritin Panel  2. Dependent edema  - CBC with Differential/Platelet - Iron, TIBC and Ferritin Panel  3. Hyperlipidemia, mixed   4. Anemia  - CBC with Differential/Platelet - Iron, TIBC and Ferritin Panel  5. Hypokalemia   6. Medication management  - CBC with Differential/Platelet - Iron, TIBC and Ferritin  Panel    Follow Up Instructions:        I discussed the assessment and treatment plan with the patient. The patient was provided an opportunity to ask questions and all were answered. The patient agreed with the plan and demonstrated an understanding of the instructions.       The patient was advised to call back or seek an in-person evaluation if the symptoms worsen or if the condition fails to improve as anticipated.    Marinus Maw, MD

## 2023-03-26 ENCOUNTER — Encounter: Payer: Self-pay | Admitting: Internal Medicine

## 2023-03-26 ENCOUNTER — Ambulatory Visit: Payer: Medicare Other | Admitting: Internal Medicine

## 2023-03-26 VITALS — BP 138/60 | HR 100 | Temp 98.5°F | Resp 16 | Ht 69.0 in | Wt 204.0 lb

## 2023-03-26 DIAGNOSIS — D649 Anemia, unspecified: Secondary | ICD-10-CM | POA: Diagnosis not present

## 2023-03-26 DIAGNOSIS — E876 Hypokalemia: Secondary | ICD-10-CM

## 2023-03-26 DIAGNOSIS — E782 Mixed hyperlipidemia: Secondary | ICD-10-CM

## 2023-03-26 DIAGNOSIS — R609 Edema, unspecified: Secondary | ICD-10-CM

## 2023-03-26 DIAGNOSIS — Z79899 Other long term (current) drug therapy: Secondary | ICD-10-CM

## 2023-03-26 DIAGNOSIS — I10 Essential (primary) hypertension: Secondary | ICD-10-CM

## 2023-03-26 NOTE — Patient Instructions (Addendum)
Due to recent changes in healthcare laws, you may see the results of your imaging and laboratory studies on MyChart before your provider has had a chance to review them.  We understand that in some cases there may be results that are confusing or concerning to you. Not all laboratory results come back in the same time frame and the provider may be waiting for multiple results in order to interpret others.  Please give [place] 48 hours in order for your provider to thoroughly review all the results before contacting the office for clarification of your results.  ++++++++++++++++++++++++++     Future Appointments  Date Time Provider Department  03/26/2023 11:00 AM Lucky Cowboy, MD [facility]  04/02/2023  9:40 AM Lanier Prude, MD [facility]  04/27/2023 10:00 AM Mathis Fare, LCSW [facility]  05/25/2023  9:00 AM Debbe Odea, MD [facility]  06/12/2023                                   10:30 AM Adela Glimpse, NP [facility]  06/18/2023 10:00 AM Butch Penny, NP [facility]  09/15/2023                              6 mo  10:30 AM Lucky Cowboy, MD [facility]  12/16/2023                             wellness 10:30 AM Adela Glimpse, NP [facility]  03/23/2024                               cpe 10:00 AM Lucky Cowboy, MD [facility]   ++++++++++++++++++++++++++  Vit D  & Vit C 1,000 mg   are recommended to help protect  against the Covid-19 and other Corona viruses.    Also it's recommended  to take  Zinc 50 mg  to help  protect against the Covid-19   and best place to get  is also on [email or web address].[email or web address]  and don't pay more than 6-8 cents /pill !   +++++++++++++++++++++++++++++++++++++++ Recommend Adult Low Dose Aspirin or  coated  Aspirin 81 mg daily  To reduce risk of Colon Cancer 40 %,  Skin Cancer 26 % ,  Melanoma 46%  and  Pancreatic cancer 60% +++++++++++++++++++++++++++++++++++++++++ Vitamin D goal  is between 70-100.  Please make sure that you are taking your Vitamin D as directed.  It  is very important as a natural anti-inflammatory  helping hair, skin, and nails, as well as reducing stroke and heart attack risk.  It helps your bones and helps with mood. It also decreases numerous cancer risks so please take it as directed.  Low Vit D is associated with a 200-300% higher risk for CANCER  and 200-300% higher risk for HEART   ATTACK  &  STROKE.   .....................................Marland Kitchen It is also associated with higher death rate at younger ages,  autoimmune diseases like Rheumatoid arthritis, Lupus, Multiple Sclerosis.    Also many other serious conditions, like depression, Alzheimer's Dementia, infertility, muscle aches, fatigue, fibromyalgia - just to name a few. +++++++++++++++++++++++++++++++++++++++++ Recommend the book "The END of DIETING" by Dr Monico Hoar  & the book "The END of DIABETES " by Dr Monico Hoar At [email or web address] - get book & Audio CD's    Being  diabetic has a  300% increased risk for heart attack, stroke, cancer, and alzheimer- type vascular dementia. It is very important that you work harder with diet by avoiding all foods that are white. Avoid white rice (brown & wild rice is OK), white potatoes (sweetpotatoes in moderation is OK), White bread or wheat bread or anything made out of white flour like bagels, donuts, rolls, buns, biscuits, cakes, pastries, cookies, pizza crust, and pasta (made from white flour & egg whites) - vegetarian pasta or spinach or wheat pasta is OK. Multigrain breads like Arnold's or Pepperidge [employer], or multigrain sandwich thins or flatbreads.  Diet, exercise and weight loss can reverse and cure diabetes in the early stages.  Diet, exercise and weight loss is very important in the control and prevention of complications of diabetes which affects every system in your body, ie. Brain - dementia/stroke, eyes - glaucoma/blindness, heart - heart attack/heart failure, kidneys - dialysis, stomach - gastric paralysis, intestines - malabsorption,  nerves - severe painful neuritis, circulation - gangrene & loss of a leg(s), and finally cancer and Alzheimers.    I recommend avoid fried & greasy foods,  sweets/candy, white rice (brown or wild rice or Quinoa is OK), white potatoes (sweet potatoes are OK) - anything made from white flour - bagels, doughnuts, rolls, buns, biscuits,white and wheat breads, pizza crust and traditional pasta made of white flour & egg white(vegetarian pasta or spinach or wheat pasta is OK).  Multi-grain bread is OK - like multi-grain flat bread or sandwich thins. Avoid alcohol in excess. Exercise is also important.    Eat all the vegetables you want - avoid meat, especially red meat and dairy - especially cheese.  Cheese is the most concentrated form of trans-fats which is the worst thing to clog up our arteries. Veggie cheese is OK which can be found in the fresh produce section at [employer] or [employer] or [employer]  +++++++++++++++++++++++++++++++++++++++ DASH Eating Plan  DASH stands for "Dietary Approaches to Stop Hypertension."   The DASH eating plan is a healthy eating plan that has been shown to reduce high blood pressure (hypertension). Additional health benefits may include reducing the risk of type 2 diabetes mellitus, heart disease, and stroke. The DASH eating plan may also help with weight loss. WHAT DO I NEED TO KNOW ABOUT THE DASH EATING PLAN? For the DASH eating plan, you will follow these general guidelines: Choose foods with a percent daily value for sodium of less than 5% (as listed on the food label). Use salt-free seasonings or herbs instead of table salt or sea salt. Check with your health care provider or pharmacist before using salt substitutes. Eat lower-sodium products, often labeled as "lower sodium" or "no salt added." Eat fresh foods. Eat more vegetables, fruits, and low-fat dairy products. Choose whole grains. Look for the word "whole" as the first word in the ingredient  list. Choose fish  Limit sweets, desserts, sugars, and sugary drinks. Choose heart-healthy fats. Eat veggie cheese  Eat more home-cooked food and less restaurant, buffet, and fast food. Limit fried foods. Cook foods using methods other than frying. Limit canned vegetables. If you do use them, rinse them well to decrease the sodium. When eating at a restaurant, ask that your food be prepared with less salt, or no salt if possible.                      WHAT FOODS CAN I EAT? Read Dr Andris Baumann books on  The End of Dieting & The End of Diabetes  Grains Whole grain or whole wheat bread. Brown rice. Whole grain or whole wheat pasta. Quinoa, bulgur, and whole grain cereals. Low-sodium cereals. Corn or whole wheat flour tortillas. Whole grain cornbread. Whole grain crackers. Low-sodium crackers.  Vegetables Fresh or frozen vegetables (raw, steamed, roasted, or grilled). Low-sodium or reduced-sodium tomato and vegetable juices. Low-sodium or reduced-sodium tomato sauce and paste. Low-sodium or reduced-sodium canned vegetables.   Fruits All fresh, canned (in natural juice), or frozen fruits.  Protein Products  All fish and seafood.  Dried beans, peas, or lentils. Unsalted nuts and seeds. Unsalted canned beans.  Dairy Low-fat dairy products, such as skim or 1% milk, 2% or reduced-fat cheeses, low-fat ricotta or cottage cheese, or plain low-fat yogurt. Low-sodium or reduced-sodium cheeses.  Fats and Oils Tub margarines without trans fats. Light or reduced-fat mayonnaise and salad dressings (reduced sodium). Avocado. Safflower, olive, or canola oils. Natural peanut or almond butter.  Other Unsalted popcorn and pretzels. The items listed above may not be a complete list of recommended foods or beverages. Contact your dietitian for more options.  +++++++++++++++  WHAT FOODS ARE NOT RECOMMENDED? Grains/ White flour or wheat flour White bread. White pasta. White rice. Refined cornbread.  Bagels and croissants. Crackers that contain trans fat.  Vegetables  Creamed or fried vegetables. Vegetables in a . Regular canned vegetables. Regular canned tomato sauce and paste. Regular tomato and vegetable juices.  Fruits Dried fruits. Canned fruit in light or heavy syrup. Fruit juice.  Meat and Other Protein Products Meat in general - RED meat & White meat.  Fatty cuts of meat. Ribs, chicken wings, all processed meats as bacon, sausage, bologna, salami, fatback, hot dogs, bratwurst and packaged luncheon meats.  Dairy Whole or 2% milk, cream, half-and-half, and cream cheese. Whole-fat or sweetened yogurt. Full-fat cheeses or blue cheese. Non-dairy creamers and whipped toppings. Processed cheese, cheese spreads, or cheese curds.  Condiments Onion and garlic salt, seasoned salt, table salt, and sea salt. Canned and packaged gravies. Worcestershire sauce. Tartar sauce. Barbecue sauce. Teriyaki sauce. Soy sauce, including reduced sodium. Steak sauce. Fish sauce. Oyster sauce. Cocktail sauce. Horseradish. Ketchup and mustard. Meat flavorings and tenderizers. Bouillon cubes. Hot sauce. Tabasco sauce. Marinades. Taco seasonings. Relishes.  Fats and Oils Butter, stick margarine, lard, shortening and bacon fat. Coconut, palm kernel, or palm oils. Regular salad dressings.  Pickles and olives. Salted popcorn and pretzels.  The items listed above may not be a complete list of foods and beverages to avoid.

## 2023-03-28 ENCOUNTER — Encounter: Payer: Self-pay | Admitting: Internal Medicine

## 2023-03-31 ENCOUNTER — Ambulatory Visit: Payer: Medicare Other | Admitting: Cardiology

## 2023-04-01 ENCOUNTER — Institutional Professional Consult (permissible substitution): Payer: Medicare Other | Admitting: Cardiology

## 2023-04-01 NOTE — Progress Notes (Unsigned)
Electrophysiology Office Note:    Date:  04/01/2023   ID:  Cody Philmore Kareem Cathey., DOB 04-05-1946, MRN [id number]  [facility] Cardiologist:  Debbe Odea, MD  [facility] Electrophysiologist:  Lanier Prude, MD   Referring MD: Lucky Cowboy, MD   Chief Complaint: Atrial fibrillation  History of Present Illness:    Cody Philmore Braxon Suder. is a 77 y.o. malewho I am seeing today for an evaluation of atrial fibrillation at the request of Dr. Azucena Cecil.  The patient was last seen by Dr. Azucena Cecil on Feb 16, 2023.  The patient has a medical history that includes hypertension, hyperlipidemia, chronic systolic heart failure.  The patient has a history of prior cardioversion in February 2024.  At the last appointment, the patient was maintaining normal rhythm.  He reports continued shortness of breath despite being normal rhythm.  He is not volume overloaded on exam today.  Uses a walker to assist with ambulation.        Their past medical, social and family history was reveiwed.   ROS:   Please see the history of present illness.    All other systems reviewed and are negative.  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    Wt Readings from Last 3 Encounters:  03/26/23 204 lb (92.5 kg)  03/04/23 205 lb 9.6 oz (93.3 kg)  02/16/23 206 lb 4 oz (93.6 kg)     GEN:  Well  nourished, well developed in no acute distress.  Elderly. CARDIAC: Regular rate and rhythm, no murmurs, rubs, gallops RESPIRATORY:  Clear to auscultation without rales, wheezing or rhonchi       ASSESSMENT AND PLAN:    1. Persistent atrial fibrillation (HCC)   2. Encounter for long-term (current) use of high-risk medication   3. Chronic systolic heart failure (HCC)   4. Primary hypertension     #Persistent atrial fibrillation Previously cardioverted.  Associated with a reduced left ventricular ejection fraction.  Rhythm control indicated.  Currently on amiodarone.  On Eliquis for stroke prophylaxis.  Discussed treatment options today for his atrial fibrillation including amiodarone, alternative antiarrhythmic drugs and catheter ablation.  I do not think he is a good candidate for catheter ablation at this time given his persistent shortness of breath despite being in normal rhythm and frailty.  #High risk med monitoring-amiodarone AST ALT within normal limits on Mar 04, 2023.  TSH within normal limits on Mar 04, 2023.  #Chronic systolic heart failure NYHA class III.  Warm and dry on exam today.  Continue GDMT per Dr. Azucena Cecil.  Rhythm control indicated as above.  #Hypertension At goal today.  Recommend checking blood pressures 1-2 times per week at home and recording the values.  Recommend bringing these recordings to the primary care physician.   Follow-up 6 months with EP APP.  Blood work at that visit for amiodarone monitoring.     Signed, Sheria Lang  Chalmers Cater, MD, [facility] 04/01/2023 9:10 PM    Electrophysiology [facility]

## 2023-04-02 ENCOUNTER — Encounter: Payer: Self-pay | Admitting: Cardiology

## 2023-04-02 ENCOUNTER — Ambulatory Visit: Payer: Medicare Other | Attending: Cardiology | Admitting: Cardiology

## 2023-04-02 VITALS — BP 102/60 | HR 75 | Ht 69.0 in | Wt 208.6 lb

## 2023-04-02 DIAGNOSIS — I4819 Other persistent atrial fibrillation: Secondary | ICD-10-CM | POA: Diagnosis not present

## 2023-04-02 DIAGNOSIS — I5022 Chronic systolic (congestive) heart failure: Secondary | ICD-10-CM

## 2023-04-02 DIAGNOSIS — I10 Essential (primary) hypertension: Secondary | ICD-10-CM | POA: Diagnosis not present

## 2023-04-02 DIAGNOSIS — Z79899 Other long term (current) drug therapy: Secondary | ICD-10-CM | POA: Diagnosis not present

## 2023-04-02 NOTE — Patient Instructions (Signed)
Medication Instructions:  Your physician recommends that you continue on your current medications as directed. Please refer to the Current Medication list given to you today.  *If you need a refill on your cardiac medications before your next appointment, please call your pharmacy*  Follow-Up: At [facility], you and your health needs are our priority.  As part of our continuing mission to provide you with exceptional heart care, we have created designated Provider Care Teams.  These Care Teams include your primary Cardiologist (physician) and Advanced Practice Providers (APPs -  Physician Assistants and Nurse Practitioners) who all work together to provide you with the care you need, when you need it.  Your next appointment:   6 month(s)  Provider:   You will see one of the following Advanced Practice Providers on your designated Care Team:   Renee Ursuy, PA-C Michael "Andy" Tillery, PA-C Suzann Riddle, NP   

## 2023-04-03 ENCOUNTER — Ambulatory Visit: Payer: Medicare Other | Attending: Cardiology

## 2023-04-03 DIAGNOSIS — I4819 Other persistent atrial fibrillation: Secondary | ICD-10-CM

## 2023-04-03 LAB — ECHOCARDIOGRAM COMPLETE
AR max vel: 2.22 cm2
AV Area VTI: 2.47 cm2
AV Area mean vel: 2.27 cm2
AV Mean grad: 14 mmHg
AV Peak grad: 26.4 mmHg
AV Vena cont: 0.4 cm
Ao pk vel: 2.57 m/s
Area-P 1/2: 3.65 cm2
Calc EF: 54.1 %
S' Lateral: 2.6 cm
Single Plane A2C EF: 54.6 %
Single Plane A4C EF: 53.2 %

## 2023-04-27 ENCOUNTER — Ambulatory Visit (INDEPENDENT_AMBULATORY_CARE_PROVIDER_SITE_OTHER): Payer: Medicare Other | Admitting: Psychiatry

## 2023-04-27 DIAGNOSIS — F331 Major depressive disorder, recurrent, moderate: Secondary | ICD-10-CM | POA: Diagnosis not present

## 2023-04-27 NOTE — Progress Notes (Signed)
[facility] Counselor/Therapist Progress Note  Patient ID: Cody Philmore Prynce Jacober., MRN: [id number],    Date: 04/27/2023  Time Spent: 48 minutes  Treatment Type: Individual Therapy  Reported Symptoms:  depression, anxiety  Mental Status Exam:  Appearance:   Casual     Behavior:  Appropriate, Sharing, and Motivated  Motor:  Uses cane to walk  Speech/Language:   Clear and Coherent  Affect:  Depressed  Mood:  anxious and depressed  Thought process:  goal directed  Thought content:    Rumination  Sensory/Perceptual disturbances:    WNL  Orientation:  oriented to person, place, time/date, situation, day of week, month of year, year, and stated date of April 27, 2023  Attention:  Fair  Concentration:  Fair  Memory:  Some short term memory issues and states Dr is aware  Fund of knowledge:   Good and Fair  Insight:    Good and Fair  Judgment:   Good  Impulse Control:  Good   Risk Assessment: Danger to Self:  No Self-injurious Behavior: No Danger to Others: No Duty to Warn:no Physical Aggression / Violence:No  Access to Firearms a concern: No  Gang Involvement:No   Subjective: Patient here for session today and reports symptoms of depression and anxiety, more depression more recently. Some increased contact with separated wife, stressful at times. Feels depression is more related to being more isolated but does see family and church friends some. Some contact with adult children living in [place] and surrounding area. Anxiety also present along with his depression. Denies any SI. Focused more on the depression and some behaviors he can increase that could help with his depression. More aware of his depression when he sees sad things on TV "especially with husbands and wives and I don't have that anymore." Encouraged patient in his getting out of the house more often and engaging people in ways that he has previous done more often including visiting family, attending his  church, and getting out places close to his home. Talked further re: leftover feelings from his separation from wife and adds that "he thinks about is sometimes like today" and then "can go for a while without it bothering me as much." Larey Seat recently at home but wasn't hurt; is more aware of how easy it is to fall and states he is being more careful and using his cane regularly, as we discussed several safety measures for him to be more aware of. States he has med appt coming up re: "my Afib and a check on my liver." Encouraged healthy habits of eating healthy, walking carefully, making good decisions, good family and friend contact. Reminded patient in his being able to look more for positives and not focusing as heavily on the negatives, and increase as he is able his time with other people.  Interventions: Cognitive Behavioral Therapy and Ego-Supportive  Long term goal: Develop healthy cognitive patterns and beliefs about self and the world that lead to alleviation and help prevent relapse of depression. Short term goal: Identify and replace depressive thinking that leads to depressive feelings and actions.  He will replace the depressive thinking with more positive, hopeful thoughts that can lead to more positive and hopeful feelings. Strategy: Patient to more consistently monitor his thoughts and catch the depressive/negative thoughts, trying to change them to positive and more reality-based thought patterns. Look at his negative thoughts and weigh them against the evidence.   Diagnosis:   ICD-10-CM   1. Major depressive  disorder, recurrent episode, moderate (HCC)  F33.1      Plan: Patient in today and participated well in session working more on his depression and anxiety.  I continue to encourage patient in having frequent contact with family, neighbors, sister, church friends and other friends that he is known over the years.  Having more difficulty at times being ambulatory but the cane helps  him.  Encouraged to be safe in his getting around and in his driving.  Has made progress and needs to continue working with goal-directed behaviors to move in a forward direction. Encouraged patient to be practicing more positive/self affirming behaviors as noted in session including: Initiate interaction with adult children and sister more regularly, wearing his hearing aids more regularly as they significantly impact his communication with others, stay in contact with people who care about him and are supportive, using his cane as needed for stability per doctor recommendations, encouraging self talk, getting outside some each day per doctor's approval, focus more on enjoying situations in the present rather than rehashing a lot of negative from the past repeatedly, stay on prescribed medication, take advantage of opportunities he has to be with family and other people that care about him, and recognize the strength he shows working with goal-directed behaviors to move in a direction that supports his improved emotional health   Self rating scales: 1-10 depression scale-5 1-10 anxiety scale-5 1-10 self-esteem scale-6 1-10 motivation scale-5 1-10 hopefulness scale-6  Goal review and progress/challenges noted with patient.  Next appt within 5 weeks.   Mathis Fare, LCSW

## 2023-04-28 ENCOUNTER — Ambulatory Visit (HOSPITAL_COMMUNITY)
Admission: RE | Admit: 2023-04-28 | Discharge: 2023-04-28 | Disposition: A | Discharge status: Home / Self Care | Payer: Medicare Other | Source: Ambulatory Visit | Attending: Physician Assistant | Admitting: Physician Assistant

## 2023-04-28 DIAGNOSIS — D696 Thrombocytopenia, unspecified: Secondary | ICD-10-CM | POA: Diagnosis present

## 2023-04-28 DIAGNOSIS — K7460 Unspecified cirrhosis of liver: Secondary | ICD-10-CM | POA: Insufficient documentation

## 2023-05-12 ENCOUNTER — Other Ambulatory Visit: Payer: Self-pay

## 2023-05-12 ENCOUNTER — Emergency Department (HOSPITAL_COMMUNITY): Payer: Medicare Other

## 2023-05-12 ENCOUNTER — Inpatient Hospital Stay (HOSPITAL_COMMUNITY): Payer: Medicare Other

## 2023-05-12 ENCOUNTER — Inpatient Hospital Stay (HOSPITAL_COMMUNITY)
Admission: EM | Admit: 2023-05-12 | Discharge: 2023-05-25 | DRG: 682 | Disposition: A | Discharge status: Skilled Nursing Facility | Payer: Medicare Other | Attending: Internal Medicine | Admitting: Internal Medicine

## 2023-05-12 DIAGNOSIS — Z8261 Family history of arthritis: Secondary | ICD-10-CM

## 2023-05-12 DIAGNOSIS — R159 Full incontinence of feces: Secondary | ICD-10-CM | POA: Diagnosis present

## 2023-05-12 DIAGNOSIS — E875 Hyperkalemia: Secondary | ICD-10-CM | POA: Diagnosis not present

## 2023-05-12 DIAGNOSIS — E876 Hypokalemia: Secondary | ICD-10-CM | POA: Insufficient documentation

## 2023-05-12 DIAGNOSIS — G3184 Mild cognitive impairment, so stated: Secondary | ICD-10-CM | POA: Diagnosis present

## 2023-05-12 DIAGNOSIS — N179 Acute kidney failure, unspecified: Principal | ICD-10-CM | POA: Diagnosis present

## 2023-05-12 DIAGNOSIS — E782 Mixed hyperlipidemia: Secondary | ICD-10-CM | POA: Diagnosis present

## 2023-05-12 DIAGNOSIS — K922 Gastrointestinal hemorrhage, unspecified: Secondary | ICD-10-CM | POA: Insufficient documentation

## 2023-05-12 DIAGNOSIS — M6282 Rhabdomyolysis: Secondary | ICD-10-CM | POA: Diagnosis not present

## 2023-05-12 DIAGNOSIS — G9340 Encephalopathy, unspecified: Secondary | ICD-10-CM | POA: Insufficient documentation

## 2023-05-12 DIAGNOSIS — Z635 Disruption of family by separation and divorce: Secondary | ICD-10-CM

## 2023-05-12 DIAGNOSIS — K7460 Unspecified cirrhosis of liver: Secondary | ICD-10-CM | POA: Diagnosis not present

## 2023-05-12 DIAGNOSIS — K652 Spontaneous bacterial peritonitis: Secondary | ICD-10-CM | POA: Diagnosis not present

## 2023-05-12 DIAGNOSIS — I480 Paroxysmal atrial fibrillation: Secondary | ICD-10-CM | POA: Diagnosis present

## 2023-05-12 DIAGNOSIS — Z85828 Personal history of other malignant neoplasm of skin: Secondary | ICD-10-CM

## 2023-05-12 DIAGNOSIS — N400 Enlarged prostate without lower urinary tract symptoms: Secondary | ICD-10-CM | POA: Diagnosis present

## 2023-05-12 DIAGNOSIS — M797 Fibromyalgia: Secondary | ICD-10-CM | POA: Diagnosis present

## 2023-05-12 DIAGNOSIS — I10 Essential (primary) hypertension: Secondary | ICD-10-CM | POA: Diagnosis not present

## 2023-05-12 DIAGNOSIS — Z79899 Other long term (current) drug therapy: Secondary | ICD-10-CM

## 2023-05-12 DIAGNOSIS — D6489 Other specified anemias: Secondary | ICD-10-CM | POA: Diagnosis present

## 2023-05-12 DIAGNOSIS — D649 Anemia, unspecified: Secondary | ICD-10-CM

## 2023-05-12 DIAGNOSIS — F331 Major depressive disorder, recurrent, moderate: Secondary | ICD-10-CM | POA: Diagnosis present

## 2023-05-12 DIAGNOSIS — G9341 Metabolic encephalopathy: Secondary | ICD-10-CM | POA: Diagnosis not present

## 2023-05-12 DIAGNOSIS — R5381 Other malaise: Secondary | ICD-10-CM | POA: Diagnosis present

## 2023-05-12 DIAGNOSIS — Z7901 Long term (current) use of anticoagulants: Secondary | ICD-10-CM

## 2023-05-12 DIAGNOSIS — N183 Chronic kidney disease, stage 3 unspecified: Secondary | ICD-10-CM | POA: Diagnosis present

## 2023-05-12 DIAGNOSIS — K2971 Gastritis, unspecified, with bleeding: Secondary | ICD-10-CM | POA: Diagnosis not present

## 2023-05-12 DIAGNOSIS — N19 Unspecified kidney failure: Secondary | ICD-10-CM | POA: Insufficient documentation

## 2023-05-12 DIAGNOSIS — K219 Gastro-esophageal reflux disease without esophagitis: Secondary | ICD-10-CM | POA: Diagnosis present

## 2023-05-12 DIAGNOSIS — Z888 Allergy status to other drugs, medicaments and biological substances status: Secondary | ICD-10-CM

## 2023-05-12 DIAGNOSIS — Z801 Family history of malignant neoplasm of trachea, bronchus and lung: Secondary | ICD-10-CM

## 2023-05-12 DIAGNOSIS — R195 Other fecal abnormalities: Secondary | ICD-10-CM | POA: Diagnosis not present

## 2023-05-12 DIAGNOSIS — Z880 Allergy status to penicillin: Secondary | ICD-10-CM

## 2023-05-12 DIAGNOSIS — K7030 Alcoholic cirrhosis of liver without ascites: Secondary | ICD-10-CM

## 2023-05-12 DIAGNOSIS — N1832 Chronic kidney disease, stage 3b: Secondary | ICD-10-CM | POA: Diagnosis present

## 2023-05-12 DIAGNOSIS — R531 Weakness: Principal | ICD-10-CM

## 2023-05-12 DIAGNOSIS — I4891 Unspecified atrial fibrillation: Secondary | ICD-10-CM | POA: Diagnosis present

## 2023-05-12 DIAGNOSIS — R710 Precipitous drop in hematocrit: Secondary | ICD-10-CM | POA: Diagnosis not present

## 2023-05-12 DIAGNOSIS — R4182 Altered mental status, unspecified: Secondary | ICD-10-CM | POA: Diagnosis not present

## 2023-05-12 DIAGNOSIS — E860 Dehydration: Secondary | ICD-10-CM | POA: Diagnosis present

## 2023-05-12 DIAGNOSIS — I130 Hypertensive heart and chronic kidney disease with heart failure and stage 1 through stage 4 chronic kidney disease, or unspecified chronic kidney disease: Secondary | ICD-10-CM | POA: Diagnosis not present

## 2023-05-12 DIAGNOSIS — Z833 Family history of diabetes mellitus: Secondary | ICD-10-CM

## 2023-05-12 DIAGNOSIS — M545 Low back pain, unspecified: Secondary | ICD-10-CM | POA: Diagnosis not present

## 2023-05-12 DIAGNOSIS — K2980 Duodenitis without bleeding: Secondary | ICD-10-CM | POA: Diagnosis present

## 2023-05-12 DIAGNOSIS — I5022 Chronic systolic (congestive) heart failure: Secondary | ICD-10-CM | POA: Diagnosis not present

## 2023-05-12 DIAGNOSIS — G4733 Obstructive sleep apnea (adult) (pediatric): Secondary | ICD-10-CM

## 2023-05-12 DIAGNOSIS — K766 Portal hypertension: Secondary | ICD-10-CM | POA: Diagnosis present

## 2023-05-12 DIAGNOSIS — Z885 Allergy status to narcotic agent status: Secondary | ICD-10-CM

## 2023-05-12 DIAGNOSIS — K3189 Other diseases of stomach and duodenum: Secondary | ICD-10-CM | POA: Diagnosis present

## 2023-05-12 DIAGNOSIS — N3289 Other specified disorders of bladder: Secondary | ICD-10-CM | POA: Diagnosis present

## 2023-05-12 DIAGNOSIS — G8929 Other chronic pain: Secondary | ICD-10-CM | POA: Diagnosis present

## 2023-05-12 DIAGNOSIS — Z803 Family history of malignant neoplasm of breast: Secondary | ICD-10-CM

## 2023-05-12 DIAGNOSIS — K2970 Gastritis, unspecified, without bleeding: Secondary | ICD-10-CM

## 2023-05-12 DIAGNOSIS — K7031 Alcoholic cirrhosis of liver with ascites: Secondary | ICD-10-CM | POA: Diagnosis present

## 2023-05-12 DIAGNOSIS — I129 Hypertensive chronic kidney disease with stage 1 through stage 4 chronic kidney disease, or unspecified chronic kidney disease: Secondary | ICD-10-CM | POA: Diagnosis not present

## 2023-05-12 DIAGNOSIS — Z87891 Personal history of nicotine dependence: Secondary | ICD-10-CM

## 2023-05-12 DIAGNOSIS — K2990 Gastroduodenitis, unspecified, without bleeding: Secondary | ICD-10-CM | POA: Diagnosis not present

## 2023-05-12 DIAGNOSIS — T796XXA Traumatic ischemia of muscle, initial encounter: Secondary | ICD-10-CM

## 2023-05-12 DIAGNOSIS — Z8249 Family history of ischemic heart disease and other diseases of the circulatory system: Secondary | ICD-10-CM

## 2023-05-12 DIAGNOSIS — I959 Hypotension, unspecified: Secondary | ICD-10-CM | POA: Diagnosis not present

## 2023-05-12 DIAGNOSIS — Z6830 Body mass index (BMI) 30.0-30.9, adult: Secondary | ICD-10-CM

## 2023-05-12 DIAGNOSIS — Z886 Allergy status to analgesic agent status: Secondary | ICD-10-CM

## 2023-05-12 DIAGNOSIS — N320 Bladder-neck obstruction: Secondary | ICD-10-CM | POA: Diagnosis present

## 2023-05-12 DIAGNOSIS — E871 Hypo-osmolality and hyponatremia: Secondary | ICD-10-CM | POA: Diagnosis not present

## 2023-05-12 LAB — BASIC METABOLIC PANEL
Anion gap: 15 (ref 5–15)
Anion gap: 15 (ref 5–15)
BUN: 70 mg/dL — ABNORMAL HIGH (ref 8–23)
BUN: 69 mg/dL — ABNORMAL HIGH (ref 8–23)
CO2: 22 mmol/L (ref 22–32)
CO2: 24 mmol/L (ref 22–32)
Calcium: 7 mg/dL — ABNORMAL LOW (ref 8.9–10.3)
Calcium: 6.9 mg/dL — ABNORMAL LOW (ref 8.9–10.3)
Chloride: 97 mmol/L — ABNORMAL LOW (ref 98–111)
Chloride: 95 mmol/L — ABNORMAL LOW (ref 98–111)
Creatinine, Ser: 9.16 mg/dL — ABNORMAL HIGH (ref 0.61–1.24)
Creatinine, Ser: 9.27 mg/dL — ABNORMAL HIGH (ref 0.61–1.24)
GFR, Estimated: 6 mL/min — ABNORMAL LOW (ref >60)
GFR, Estimated: 5 mL/min — ABNORMAL LOW (ref >60)
Glucose, Bld: 120 mg/dL — ABNORMAL HIGH (ref 70–99)
Glucose, Bld: 124 mg/dL — ABNORMAL HIGH (ref 70–99)
Potassium: 2.1 mmol/L — CL (ref 3.5–5.1)
Potassium: <2 mmol/L — CL (ref 3.5–5.1)
Sodium: 134 mmol/L — ABNORMAL LOW (ref 135–145)
Sodium: 134 mmol/L — ABNORMAL LOW (ref 135–145)

## 2023-05-12 LAB — TYPE AND SCREEN
ABO/RH(D): A POS
Antibody Screen: NEGATIVE
Unit division: 0
Unit division: 0

## 2023-05-12 LAB — COMPREHENSIVE METABOLIC PANEL
ALT: 38 U/L (ref 0–44)
AST: 123 U/L — ABNORMAL HIGH (ref 15–41)
Albumin: 2.7 g/dL — ABNORMAL LOW (ref 3.5–5.0)
Alkaline Phosphatase: 50 U/L (ref 38–126)
Anion gap: 22 — ABNORMAL HIGH (ref 5–15)
BUN: 73 mg/dL — ABNORMAL HIGH (ref 8–23)
CO2: 22 mmol/L (ref 22–32)
Calcium: 7.4 mg/dL — ABNORMAL LOW (ref 8.9–10.3)
Chloride: 92 mmol/L — ABNORMAL LOW (ref 98–111)
Creatinine, Ser: 9.84 mg/dL — ABNORMAL HIGH (ref 0.61–1.24)
GFR, Estimated: 5 mL/min — ABNORMAL LOW (ref >60)
Glucose, Bld: 100 mg/dL — ABNORMAL HIGH (ref 70–99)
Potassium: 2 mmol/L — CL (ref 3.5–5.1)
Sodium: 136 mmol/L (ref 135–145)
Total Bilirubin: 2.1 mg/dL — ABNORMAL HIGH (ref 0.3–1.2)
Total Protein: 5.4 g/dL — ABNORMAL LOW (ref 6.5–8.1)

## 2023-05-12 LAB — ABO/RH: ABO/RH(D): A POS

## 2023-05-12 LAB — ACETAMINOPHEN LEVEL: Acetaminophen (Tylenol), Serum: <10 ug/mL — ABNORMAL LOW (ref 10–30)

## 2023-05-12 LAB — CBC WITH DIFFERENTIAL/PLATELET
Abs Immature Granulocytes: 0.03 10*3/uL (ref 0.00–0.07)
Basophils Absolute: 0 10*3/uL (ref 0.0–0.1)
Basophils Relative: 0 %
Eosinophils Absolute: 0.2 10*3/uL (ref 0.0–0.5)
Eosinophils Relative: 3 %
HCT: 17.8 % — ABNORMAL LOW (ref 39.0–52.0)
Hemoglobin: 5.8 g/dL — CL (ref 13.0–17.0)
Immature Granulocytes: 0 %
Lymphocytes Relative: 16 %
Lymphs Abs: 1.2 10*3/uL (ref 0.7–4.0)
MCH: 27.8 pg (ref 26.0–34.0)
MCHC: 32.6 g/dL (ref 30.0–36.0)
MCV: 85.2 fL (ref 80.0–100.0)
Monocytes Absolute: 0.6 10*3/uL (ref 0.1–1.0)
Monocytes Relative: 8 %
Neutro Abs: 5.3 10*3/uL (ref 1.7–7.7)
Neutrophils Relative %: 73 %
Platelets: 104 10*3/uL — ABNORMAL LOW (ref 150–400)
RBC: 2.09 MIL/uL — ABNORMAL LOW (ref 4.22–5.81)
RDW: 14.4 % (ref 11.5–15.5)
WBC: 7.3 10*3/uL (ref 4.0–10.5)
nRBC: 0 % (ref 0.0–0.2)

## 2023-05-12 LAB — PHOSPHORUS: Phosphorus: 5.2 mg/dL — ABNORMAL HIGH (ref 2.5–4.6)

## 2023-05-12 LAB — SALICYLATE LEVEL: Salicylate Lvl: <7 mg/dL — ABNORMAL LOW (ref 7.0–30.0)

## 2023-05-12 LAB — BPAM RBC
Blood Product Expiration Date: 202408272359
Blood Product Expiration Date: 202408272359
ISSUE DATE / TIME: 202408061751
ISSUE DATE / TIME: 202408062132
Unit Type and Rh: 6200
Unit Type and Rh: 6200

## 2023-05-12 LAB — ETHANOL: Alcohol, Ethyl (B): <10 mg/dL (ref <10)

## 2023-05-12 LAB — AMMONIA: Ammonia: 25 umol/L (ref 9–35)

## 2023-05-12 LAB — CK: Total CK: 3606 U/L — ABNORMAL HIGH (ref 49–397)

## 2023-05-12 LAB — POC OCCULT BLOOD, ED: Fecal Occult Bld: POSITIVE — AB

## 2023-05-12 LAB — PREPARE RBC (CROSSMATCH)

## 2023-05-12 LAB — MAGNESIUM: Magnesium: 2.2 mg/dL (ref 1.7–2.4)

## 2023-05-12 MED ORDER — SODIUM CHLORIDE 0.9% FLUSH
3.0000 mL | Freq: Two times a day (BID) | INTRAVENOUS | Status: DC
  Administered 2023-05-12 – 2023-05-25 (×24): 3 mL via INTRAVENOUS

## 2023-05-12 MED ORDER — LACTATED RINGERS IV BOLUS
1000.0000 mL | Freq: Once | INTRAVENOUS | Status: AC
  Administered 2023-05-12: 1000 mL via INTRAVENOUS

## 2023-05-12 MED ORDER — SODIUM CHLORIDE 0.9 % IV SOLN
50.0000 ug/h | INTRAVENOUS | Status: DC
  Administered 2023-05-13 (×2): 50 ug/h via INTRAVENOUS
  Filled 2023-05-12 (×3): qty 1

## 2023-05-12 MED ORDER — POTASSIUM CHLORIDE 10 MEQ/100ML IV SOLN
10.0000 meq | INTRAVENOUS | Status: AC
  Administered 2023-05-12 (×3): 10 meq via INTRAVENOUS
  Filled 2023-05-12 (×3): qty 100

## 2023-05-12 MED ORDER — PANTOPRAZOLE 80MG IVPB - SIMPLE MED
80.0000 mg | Freq: Once | INTRAVENOUS | Status: AC
  Administered 2023-05-12: 80 mg via INTRAVENOUS
  Filled 2023-05-12: qty 100

## 2023-05-12 MED ORDER — LACTATED RINGERS IV BOLUS
500.0000 mL | Freq: Once | INTRAVENOUS | Status: AC
  Administered 2023-05-12: 500 mL via INTRAVENOUS

## 2023-05-12 MED ORDER — SODIUM BICARBONATE 8.4 % IV SOLN
INTRAVENOUS | Status: DC
  Filled 2023-05-12 (×2): qty 1000

## 2023-05-12 MED ORDER — POTASSIUM CHLORIDE 20 MEQ PO PACK
80.0000 meq | PACK | ORAL | Status: AC
  Administered 2023-05-12: 80 meq via ORAL
  Filled 2023-05-12: qty 4

## 2023-05-12 MED ORDER — SODIUM BICARBONATE 8.4 % IV SOLN
INTRAVENOUS | Status: DC
  Filled 2023-05-12: qty 1000

## 2023-05-12 MED ORDER — ACETAMINOPHEN 325 MG PO TABS
650.0000 mg | ORAL_TABLET | Freq: Four times a day (QID) | ORAL | Status: DC | PRN
  Administered 2023-05-13 – 2023-05-22 (×7): 650 mg via ORAL
  Filled 2023-05-12 (×7): qty 2

## 2023-05-12 MED ORDER — PANTOPRAZOLE INFUSION (NEW) - SIMPLE MED
8.0000 mg/h | INTRAVENOUS | Status: DC
  Administered 2023-05-12 – 2023-05-13 (×4): 8 mg/h via INTRAVENOUS
  Filled 2023-05-12 (×6): qty 100

## 2023-05-12 MED ORDER — ACETAMINOPHEN 650 MG RE SUPP: 650.0000 mg | Freq: Four times a day (QID) | RECTAL | Status: DC | PRN

## 2023-05-12 MED ORDER — AMIODARONE HCL 200 MG PO TABS
200.0000 mg | ORAL_TABLET | Freq: Every day | ORAL | Status: DC
  Administered 2023-05-12 – 2023-05-25 (×14): 200 mg via ORAL
  Filled 2023-05-12 (×14): qty 1

## 2023-05-12 MED ORDER — SODIUM CHLORIDE 0.9% IV SOLUTION: Freq: Once | INTRAVENOUS | Status: AC

## 2023-05-12 NOTE — Consult Note (Signed)
[facility] Renal Consultation Note  Requesting MD: Alinda Money Indication for Consultation: A on CRF  HPI:  Cody Hines. is a 77 y.o. male with cirrhosis, HTN, hyperlipidemia, OSA and relatively recent diagnosis of Afib with HFrEF- EF 45-50 % ( Feb of 24)  sent out of hosp on diltiazem, losartan , metoprolol and torsemide.  Looks like crt has been 1.4-1.6 really since 2015-  last check on 03/04/23 was 1.62.  He was brought into the ER today with dec MS-  found laying in his bed confused-  had not been physically seen for over a week.  Labs showing BUN and crt of 73 and 9.8, K of 2.0, CK of 3600, hgb of 5.8-  initial BP 113/58-  has received many boluses of volume-  now on bicarb drip -  also getting potassium and blood-  no UOP -  renal US being done.  Urines in the recent past have been benign-  no blood or protein.  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  [place] from today is pending.  He does not think he has made any urine-  is wearing depends   Creat  Date/Time Value Ref Range Status  03/04/2023 10:22 AM 1.62 (H) 0.70 - 1.28 mg/dL Final  16/07/9603 54:09 PM 1.57 (H) 0.70 - 1.28 mg/dL Final  81/19/1478 29:56 AM 1.58 (H) 0.70 - 1.28 mg/dL Final  21/30/8657 84:69 AM 1.54 (H) 0.70 - 1.28 mg/dL Final  62/95/2841 32:44 AM 1.49 (H) 0.70 - 1.28 mg/dL Final  10/08/7251 66:44 AM 1.49 (H) 0.70 - 1.28 mg/dL Final  03/47/4259 56:38 PM 1.48 (H) 0.70 - 1.28 mg/dL Final  75/64/3329 51:88 AM 1.42 (H) 0.70 - 1.18 mg/dL Final    Comment:    For patients >34 years of age, the reference limit for Creatinine is approximately 13% higher for people identified as African-American. .   07/12/2020 11:01 AM 1.46 (H) 0.70 - 1.18 mg/dL Final    Comment:    For patients >61 years of age, the reference limit for Creatinine is approximately 13% higher for people identified as African-American. .   04/05/2020 10:15 AM  1.41 (H) 0.70 - 1.18 mg/dL Final    Comment:    For patients >67 years of age, the reference limit for Creatinine is approximately 13% higher for people identified as African-American. Marland Kitchen   01/03/2020 09:13 AM 1.31 (H) 0.70 - 1.18 mg/dL Final    Comment:    For patients >17 years of age, the reference limit for Creatinine is approximately 13% higher for people identified as African-American. .   09/27/2019 09:31 AM 1.41 (H) 0.70 - 1.18 mg/dL Final    Comment:    For patients >74 years of age, the reference limit for Creatinine is approximately 13% higher for people identified as African-American. .   08/10/2019 10:00 AM 1.43 (H) 0.70 - 1.18 mg/dL Final    Comment:    For patients >103 years of age, the reference limit for Creatinine is approximately 13% higher for people identified as African-American. .   07/19/2019 12:13 PM 1.51 (H) 0.70 - 1.18 mg/dL Final    Comment:    For patients >83 years of age, the reference limit for Creatinine is approximately 13% higher for people identified as African-American. .   12/17/2018 09:26 AM 1.42 (H) 0.70 - 1.18 mg/dL Final    Comment:  For patients >64 years of age, the reference limit for Creatinine is approximately 13% higher for people identified as African-American. .   09/17/2018 11:05 AM 1.31 (H) 0.70 - 1.18 mg/dL Final    Comment:    For patients >70 years of age, the reference limit for Creatinine is approximately 13% higher for people identified as African-American. .   06/18/2018 09:27 AM 1.39 (H) 0.70 - 1.25 mg/dL Final    Comment:    For patients >43 years of age, the reference limit for Creatinine is approximately 13% higher for people identified as African-American. Marland Kitchen   03/12/2018 10:26 AM 1.46 (H) 0.70 - 1.25 mg/dL Final    Comment:    For patients >75 years of age, the reference limit for Creatinine is approximately 13% higher for people identified as African-American. Marland Kitchen   11/20/2017 11:01 AM 1.34  (H) 0.70 - 1.25 mg/dL Final    Comment:    For patients >69 years of age, the reference limit for Creatinine is approximately 13% higher for people identified as African-American. .   08/14/2017 11:15 AM 1.58 (H) 0.70 - 1.25 mg/dL Final    Comment:    For patients >59 years of age, the reference limit for Creatinine is approximately 13% higher for people identified as African-American. Marland Kitchen   04/24/2017 11:53 AM 1.53 (H) 0.70 - 1.25 mg/dL Final    Comment:      For patients > or = 75 years of age: The upper reference limit for Creatinine is approximately 13% higher for people identified as African-American.     09/15/2016 01:34 PM 1.58 (H) 0.70 - 1.25 mg/dL Final    Comment:      For patients > or = 75 years of age: The upper reference limit for Creatinine is approximately 13% higher for people identified as African-American.     06/06/2016 12:03 PM 1.76 (H) 0.70 - 1.25 mg/dL Final    Comment:      For patients > or = 75 years of age: The upper reference limit for Creatinine is approximately 13% higher for people identified as African-American.     02/18/2016 05:38 PM 1.48 (H) 0.70 - 1.25 mg/dL Final  91/47/8295 62:13 AM 1.48 (H) 0.70 - 1.25 mg/dL Final  08/65/7846 96:29 AM 1.39 (H) 0.50 - 1.35 mg/dL Final  52/84/1324 40:10 PM 1.35 0.50 - 1.35 mg/dL Final  27/25/3664 40:34 AM 1.43 (H) 0.50 - 1.35 mg/dL Final  74/25/9563 87:56 AM 1.50 (H) 0.50 - 1.35 mg/dL Final  43/32/9518 84:16 AM 0.72 0.50 - 1.35 mg/dL Final  60/63/0160 10:93 PM 1.32 0.50 - 1.35 mg/dL Final   Creatinine, Ser  Date/Time Value Ref Range Status  05/12/2023 11:59 AM 9.84 (H) 0.61 - 1.24 mg/dL Final  23/55/7322 02:54 AM 1.65 (H) 0.40 - 1.50 mg/dL Final  27/03/2375 28:31 AM 1.92 (H) 0.61 - 1.24 mg/dL Final  51/76/1607 37:10 AM 2.22 (H) 0.61 - 1.24 mg/dL Final  62/69/4854 62:70 AM 1.90 (H) 0.61 - 1.24 mg/dL Final  35/00/9381 82:99 AM 1.85 (H) 0.61 - 1.24 mg/dL Final  37/16/9678 93:81 PM 1.50 (H) 0.61 -  1.24 mg/dL Final  01/75/1025 85:27 AM 1.60 (H) 0.40 - 1.50 mg/dL Final  78/24/2353 61:44 AM 1.44 (H) 0.61 - 1.24 mg/dL Final  31/54/0086 76:19 AM 1.55 (H) 0.61 - 1.24 mg/dL Final  50/93/2671 24:58 PM 1.60 (H) 0.61 - 1.24 mg/dL Final  09/98/3382 50:53 PM 1.69 (H) 0.61 - 1.24 mg/dL Final  97/67/3419 37:90 PM 1.40 (H) 0.61 -  1.24 mg/dL Final  46/96/2952 84:13 AM 1.6 (H) 0.4 - 1.5 mg/dL Final     PMHx:   Past Medical History:  Diagnosis Date   BPH (benign prostatic hyperplasia)    Fibromyalgia    GERD (gastroesophageal reflux disease)    Hyperlipidemia    Hypertension    Hypogonadism male    IBS (irritable bowel syndrome)    OSA (obstructive sleep apnea)     Past Surgical History:  Procedure Laterality Date    nasal smr np3  1985   BUBBLE STUDY  11/07/2022   Procedure: BUBBLE STUDY;  Surgeon: Orpah Cobb, MD;  Location: [facility] END[facility];  Service: Cardiovascular;;   CARDIOVERSION N/A 11/07/2022   Procedure: CARDIOVERSION;  Surgeon: Orpah Cobb, MD;  Location: [facility] ENDOSCOPY;  Service: Cardiovascular;  Laterality: N/A;   CATARACT EXTRACTION, BILATERAL Bilateral 2021   Dr. Dione Booze, L 5/27, R 3/25   COLONOSCOPY     KNEE ARTHROSCOPY Left 1999   SKIN CANCER EXCISION  2020   nose   SPINE SURGERY  2007   L5 S 1 Disk   SPINE SURGERY  09/2019   Compression fracture stabilization by Dr. Lovell Sheehan   TEE WITHOUT CARDIOVERSION N/A 11/07/2022   Procedure: TRANSESOPHAGEAL ECHOCARDIOGRAM (TEE);  Surgeon: Orpah Cobb, MD;  Location: [facility] ENDOSCOPY;  Service: Cardiovascular;  Laterality: N/A;   VASECTOMY  1983    Family Hx:  Family History  Problem Relation Age of Onset   Cancer Mother        breast   Heart attack Father    Cancer Father        lung   Diabetes Father    Heart disease Sister    Arthritis Sister    Atrial fibrillation Sister    Atrial fibrillation Sister    Atrial fibrillation Sister    Atrial fibrillation Sister    Colon cancer Neg Hx    Colon polyps Neg Hx    Sleep  apnea Neg Hx    Esophageal cancer Neg Hx    Pancreatic cancer Neg Hx    Stomach cancer Neg Hx     Social History:  reports that he has quit smoking. His smoking use included cigarettes. He has never used smokeless tobacco. He reports that he does not currently use alcohol. He reports that he does not use drugs.  Allergies:  Allergies  Allergen Reactions   Asa [Aspirin] Diarrhea, Nausea Only and Other (See Comments)    History of ulcers, also   Atorvastatin Other (See Comments)    Muscle aches   Celebrex [Celecoxib] Other (See Comments)    Headaches    Codeine Nausea And Vomiting   Savella [Milnacipran Hcl] Other (See Comments)    Reaction not recalled   Viagra [Sildenafil Citrate] Other (See Comments)    Headaches   Penicillins Rash    Medications: Prior to Admission medications   Medication Sig Start Date End Date Taking? Authorizing Provider  acetaminophen (TYLENOL) 650 MG CR tablet Take 650 mg by mouth 2 (two) times daily. Take 1 tablet by mouth in the Morning, and at Bedtime.   Yes [provider]  amiodarone (PACERONE) 200 MG tablet Take 1 tablet (200 mg total) by mouth 2 (two) times daily for 7 days, THEN 1 tablet (200 mg total) daily. 02/16/23 05/24/23 Yes Agbor-Etang, Arlys John, MD  apixaban (ELIQUIS) 5 MG TABS tablet Take 1 tablet (5 mg total) by mouth 2 (two) times daily. 11/07/22  Yes Tyrone Nine, MD  Cholecalciferol (VITAMIN D3) 50  MCG (2000 UT) TABS 1 capsule DAILY (route: oral) 11/08/22  Yes [provider]  Flaxseed, Linseed, (FLAX SEED OIL PO) Take by mouth.   Yes [provider]  LORazepam (ATIVAN) 1 MG tablet TAKE 1 TABLET BY MOUTH EACH NIGHT AT BEDTIME FOR SLEEP 05/12/22  Yes Cottle, Steva Ready., MD  losartan (COZAAR) 25 MG tablet Take 1 tablet (25 mg total) by mouth daily. 11/07/22  Yes Tyrone Nine, MD  Magnesium 400 MG TABS 1 capsule DAILY (route: oral) 11/14/22  Yes [provider]  methocarbamol (ROBAXIN) 500 MG tablet TAKE 1 TABLET  BY MOUTH EVERY 6 HOURS AS NEEDED FOR MUSCLE SPASMS 05/13/22  Yes Raynelle Dick, NP  metoprolol succinate (TOPROL XL) 50 MG 24 hr tablet Take 1 tablet (50 mg total) by mouth daily. Take with or immediately following a meal. 11/07/22  Yes Tyrone Nine, MD  Multiple Vitamin (MULTIVITAMIN) capsule Take 1 capsule by mouth daily.   Yes [provider]  rosuvastatin (CRESTOR) 40 MG tablet Take  1 tablet  Daily  for Cholesterol 03/25/23  Yes Lucky Cowboy, MD  sertraline (ZOLOFT) 100 MG tablet Take 1 tablet (100 mg total) by mouth in the morning and at bedtime. 10/09/22  Yes Cottle, Steva Ready., MD  torsemide (DEMADEX) 20 MG tablet Take 2 tablets (40 mg) every Morning for Fluid  Retention & Leg Swelling 03/04/23  Yes Lucky Cowboy, MD  CARTIA XT 180 MG 24 hr capsule Take 180 mg by mouth daily. Patient not taking: Reported on 05/12/2023 02/18/23   [provider]    I have reviewed the patient's current medications.  Labs:  Results for orders placed or performed during the hospital encounter of 05/12/23 (from the past 48 hour(s))  Ammonia     Status: None   Collection Time: 05/12/23 11:59 AM  Result Value Ref Range   Ammonia 25 9 - 35 umol/L    Comment: Performed at [facility], [address] [place], [place] [address]  Comprehensive metabolic panel     Status: Abnormal   Collection Time: 05/12/23 11:59 AM  Result Value Ref Range   Sodium 136 135 - 145 mmol/L   Potassium 2.0 (LL) 3.5 - 5.1 mmol/L    Comment: CRITICAL RESULT CALLED TO, READ BACK BY AND VERIFIED WITH L CARSON RN 1305 05/12/2023 BY G GANADEN   Chloride 92 (L) 98 - 111 mmol/L   CO2 22 22 - 32 mmol/L   Glucose, Bld 100 (H) 70 - 99 mg/dL    Comment: Glucose reference range applies only to samples taken after fasting for at least 8 hours.   BUN 73 (H) 8 - 23 mg/dL   Creatinine, Ser 4.40 (H) 0.61 - 1.24 mg/dL   Calcium 7.4 (L) 8.9 - 10.3 mg/dL   Total Protein 5.4 (L) 6.5 - 8.1 g/dL   Albumin 2.7 (L) 3.5 -  5.0 g/dL   AST 102 (H) 15 - 41 U/L   ALT 38 0 - 44 U/L   Alkaline Phosphatase 50 38 - 126 U/L   Total Bilirubin 2.1 (H) 0.3 - 1.2 mg/dL   GFR, Estimated 5 (L) >60 mL/min    Comment: (NOTE) Calculated using the CKD-EPI Creatinine Equation (2021)    Anion gap 22 (H) 5 - 15    Comment: ELECTROLYTES REPEATED TO VERIFY Performed at [facility], [address] [place], [place] [address]   Ethanol     Status: None  Collection Time: 05/12/23 11:59 AM  Result Value Ref Range   Alcohol, Ethyl (B) <10 <10 mg/dL    Comment: (NOTE) Lowest detectable limit for serum alcohol is 10 mg/dL.  For medical purposes only. Performed at [facility], [address] [place], [place] [address]   CK     Status: Abnormal   Collection Time: 05/12/23 11:59 AM  Result Value Ref Range   Total CK 3,606 (H) 49 - 397 U/L    Comment: Performed at [facility], [address] [place], [place] [address]  Salicylate level     Status: Abnormal   Collection Time: 05/12/23 11:59 AM  Result Value Ref Range   Salicylate Lvl <7.0 (L) 7.0 - 30.0 mg/dL    Comment: Performed at [facility], [address], [place], [place] [address]  Acetaminophen level     Status: Abnormal   Collection Time: 05/12/23 11:59 AM  Result Value Ref Range   Acetaminophen (Tylenol), Serum <10 (L) 10 - 30 ug/mL    Comment: (NOTE) Therapeutic concentrations vary significantly. A range of 10-30 ug/mL  may be an effective concentration for many patients. However, some  are best treated at concentrations outside of this range. Acetaminophen concentrations >150 ug/mL at 4 hours after ingestion  and >50 ug/mL at 12 hours after ingestion are often associated with  toxic reactions.  Performed at [facility], [address], [place], [place] [address]   CBC with Differential     Status: Abnormal   Collection Time: 05/12/23 12:57 PM  Result Value Ref Range   WBC 7.3 4.0 - 10.5 K/uL   RBC 2.09 (L) 4.22 - 5.81  MIL/uL   Hemoglobin 5.8 (LL) 13.0 - 17.0 g/dL    Comment: REPEATED TO VERIFY VERIFIED WITH NEW SAMPLE THIS CRITICAL RESULT HAS VERIFIED AND BEEN CALLED TO LAURA CARSON,RN BY ZELDA BEECH ON 08 06 2024 AT 1325, AND HAS BEEN READ BACK.     HCT 17.8 (L) 39.0 - 52.0 %   MCV 85.2 80.0 - 100.0 fL   MCH 27.8 26.0 - 34.0 pg   MCHC 32.6 30.0 - 36.0 g/dL   RDW 35.0 09.3 - 81.8 %   Platelets 104 (L) 150 - 400 K/uL   nRBC 0.0 0.0 - 0.2 %   Neutrophils Relative % 73 %   Neutro Abs 5.3 1.7 - 7.7 K/uL   Lymphocytes Relative 16 %   Lymphs Abs 1.2 0.7 - 4.0 K/uL   Monocytes Relative 8 %   Monocytes Absolute 0.6 0.1 - 1.0 K/uL   Eosinophils Relative 3 %   Eosinophils Absolute 0.2 0.0 - 0.5 K/uL   Basophils Relative 0 %   Basophils Absolute 0.0 0.0 - 0.1 K/uL   Immature Granulocytes 0 %   Abs Immature Granulocytes 0.03 0.00 - 0.07 K/uL    Comment: Performed at [facility], [address], [place], [place] [address]  Type and screen [facility]     Status: None   Collection Time: 05/12/23 12:57 PM  Result Value Ref Range   ABO/RH(D) A POS    Antibody Screen NEG    Sample Expiration      05/15/2023,2359 Performed at [facility], [address] [place], [place] [address]   POC occult blood, ED     Status: Abnormal   Collection Time: 05/12/23  2:21 PM  Result Value Ref Range   Fecal Occult Bld POSITIVE (A)  NEGATIVE     ROS:  Review of systems not obtained due to patient factors.  Physical Exam: Vitals:   05/12/23 1400 05/12/23 1415  BP: (!) 125/50 (!) 128/42  Pulse: (!) 136 70  Resp: (!) 27 (!) 24  Temp:    SpO2: 100% 100%     General: pale, confused -  but saying he has one kidney smaller than the other-  he is accurate HEENT: PERLA, mucous membranes dry and pale  Neck: no JVD Heart: irreg-  initially fast but now rate controlled  Lungs: mostly clear-  good sat lying flat Abdomen: soft, non tender Extremities: shiny skin but minimal edema -  in  pain but not really with palpation of muscles  Skin: pale and shiny Neuro: globally confused-  non focal   Assessment/Plan: 77 year old WM with cirrhosis , HFrEF, afib on appropriate meds-  also stage 3 CKD- and solitary functioning kidney  now presents found down with AKI 1.Renal- baseline CKD-  crt 1.4-1.6-  last in May of 24.  Now with A on CRF in the setting of being found down, rhabdo, hypotension on ARB.  Renal US pending- last [place] in Feb of 2024 showed solitary functioning kidney.  Agree with hydration-  aggressive hydration now with bicarb based fluids for rhabdo-  hold ARB- no absolute indications for RRT-  will follow up renal US results and continue hydration, review urine when able-  follow closely for dialysis needs-  none at present 2. Hypertension/volume  - dry and hypotensive- aggressive hydration-  biacrb based fluids at 200 per hour 3. Hypokalemia-  aggressive repletion of this as well -  got 80 PO and 30 IV-  check level later  4. Anemia  - is pretty low to be CKD related-  suspecting a GIB-  transfused today    Cecille Aver 05/12/2023, 2:53 PM

## 2023-05-12 NOTE — Progress Notes (Signed)
Admission Note:  Pt arrived from ED to [address] at approximately 1539. Pt was slid over from stretcher to bed. Pt is alert and oriented x4, on room air, and denied pain. Pt's hemoglobin is currently 5.8, and blood bank notified this writer that 2u PRBC are ready, with 1st unit started at 1801 today. Pt is in NAD at this time.

## 2023-05-12 NOTE — Progress Notes (Signed)
Received call from ED nurse at 1525 per call log who reports 40 minutes has passed and a patient will be coming up, receiving nurse at lunch.  No verbal report was given and SBAR is not completed at this time.

## 2023-05-12 NOTE — ED Notes (Signed)
ED TO INPATIENT HANDOFF REPORT  ED Nurse Name and Phone #: [phone]   S Name/Age/Gender Cody Hines. 77 y.o. male Room/Bed: 5W03C/5W03C-01  Code Status   Code Status: Full Code  Home/SNF/Other Unsure due to living situation Patient oriented to: self, place, time, and situation Is this baseline? Yes   Triage Complete: Triage complete  Chief Complaint AKI (acute kidney injury) (HCC) [N17.9]  Triage Note No notes on file   Allergies Allergies  Allergen Reactions   Asa [Aspirin] Diarrhea, Nausea Only and Other (See Comments)    History of ulcers, also   Atorvastatin Other (See Comments)    Muscle aches   Celebrex [Celecoxib] Other (See Comments)    Headaches    Codeine Nausea And Vomiting   Savella [Milnacipran Hcl] Other (See Comments)    Reaction not recalled   Viagra [Sildenafil Citrate] Other (See Comments)    Headaches   Penicillins Rash    Level of Care/Admitting Diagnosis ED Disposition     ED Disposition  Admit   Condition  --   Comment  Hospital Area: [facility] [[id number]]  Level of Care: Progressive [102]  Admit to Progressive based on following criteria: GI, ENDOCRINE disease patients with GI bleeding, acute liver failure or pancreatitis, stable with diabetic ketoacidosis or thyrotoxicosis (hypothyroid) state.  Admit to Progressive based on following criteria: NEPHROLOGY stable condition requiring close monitoring for AKI, requiring Hemodialysis or Peritoneal Dialysis either from expected electrolyte imbalance, acidosis, or fluid overload that can be managed by NIPPV or high flow oxygen.  May admit patient to [facility] or [facility] if equivalent level of care is available:: No  Covid Evaluation: Asymptomatic - no recent exposure (last 10 days) testing not required  Diagnosis: AKI (acute kidney injury) [facility]) [[id number]]  Admitting Physician: Synetta Fail [[id number]]  Attending Physician: Synetta Fail  [phone]  Certification:: I certify this patient will need inpatient services for at least 2 midnights  Estimated Length of Stay: 3          B Medical/Surgery History Past Medical History:  Diagnosis Date   BPH (benign prostatic hyperplasia)    Fibromyalgia    GERD (gastroesophageal reflux disease)    Hyperlipidemia    Hypertension    Hypogonadism male    IBS (irritable bowel syndrome)    OSA (obstructive sleep apnea)    Past Surgical History:  Procedure Laterality Date    nasal smr np3  1985   BUBBLE STUDY  11/07/2022   Procedure: BUBBLE STUDY;  Surgeon: Orpah Cobb, MD;  Location: [facility] ENDOSCOPY;  Service: Cardiovascular;;   CARDIOVERSION N/A 11/07/2022   Procedure: CARDIOVERSION;  Surgeon: Orpah Cobb, MD;  Location: [facility] ENDOSCOPY;  Service: Cardiovascular;  Laterality: N/A;   CATARACT EXTRACTION, BILATERAL Bilateral 2021   Dr. Dione Booze, L 5/27, R 3/25   COLONOSCOPY     KNEE ARTHROSCOPY Left 1999   SKIN CANCER EXCISION  2020   nose   SPINE SURGERY  2007   L5 S 1 Disk   SPINE SURGERY  09/2019   Compression fracture stabilization by Dr. Lovell Sheehan   TEE WITHOUT CARDIOVERSION N/A 11/07/2022   Procedure: TRANSESOPHAGEAL ECHOCARDIOGRAM (TEE);  Surgeon: Orpah Cobb, MD;  Location: [facility] ENDOSCOPY;  Service: Cardiovascular;  Laterality: N/A;   VASECTOMY  1983     A IV Location/Drains/Wounds Patient Lines/Drains/Airways Status     Active Line/Drains/Airways     Name Placement date Placement time Site Days   Peripheral IV 05/12/23 18 G Anterior;Right;Upper  Arm 05/12/23  --  Arm  less than 1   Peripheral IV 05/12/23 20 G Left;Posterior Hand 05/12/23  1400  Hand  less than 1            Intake/Output Last 24 hours No intake or output data in the 24 hours ending 05/12/23 1627  Labs/Imaging Results for orders placed or performed during the hospital encounter of 05/12/23 (from the past 48 hour(s))  Ammonia     Status: None   Collection Time: 05/12/23 11:59 AM  Result Value  Ref Range   Ammonia 25 9 - 35 umol/L    Comment: Performed at [facility], [address] [place], [place] [address]  Comprehensive metabolic panel     Status: Abnormal   Collection Time: 05/12/23 11:59 AM  Result Value Ref Range   Sodium 136 135 - 145 mmol/L   Potassium 2.0 (LL) 3.5 - 5.1 mmol/L    Comment: CRITICAL RESULT CALLED TO, READ BACK BY AND VERIFIED WITH L CARSON RN 1305 05/12/2023 BY G GANADEN   Chloride 92 (L) 98 - 111 mmol/L   CO2 22 22 - 32 mmol/L   Glucose, Bld 100 (H) 70 - 99 mg/dL    Comment: Glucose reference range applies only to samples taken after fasting for at least 8 hours.   BUN 73 (H) 8 - 23 mg/dL   Creatinine, Ser 6.04 (H) 0.61 - 1.24 mg/dL   Calcium 7.4 (L) 8.9 - 10.3 mg/dL   Total Protein 5.4 (L) 6.5 - 8.1 g/dL   Albumin 2.7 (L) 3.5 - 5.0 g/dL   AST 540 (H) 15 - 41 U/L   ALT 38 0 - 44 U/L   Alkaline Phosphatase 50 38 - 126 U/L   Total Bilirubin 2.1 (H) 0.3 - 1.2 mg/dL   GFR, Estimated 5 (L) >60 mL/min    Comment: (NOTE) Calculated using the CKD-EPI Creatinine Equation (2021)    Anion gap 22 (H) 5 - 15    Comment: ELECTROLYTES REPEATED TO VERIFY Performed at [facility], [address] [place], [place] [address]   Ethanol     Status: None   Collection Time: 05/12/23 11:59 AM  Result Value Ref Range   Alcohol, Ethyl (B) <10 <10 mg/dL    Comment: (NOTE) Lowest detectable limit for serum alcohol is 10 mg/dL.  For medical purposes only. Performed at [facility], [address], [place], [place] [address]   CK     Status: Abnormal   Collection Time: 05/12/23 11:59 AM  Result Value Ref Range   Total CK 3,606 (H) 49 - 397 U/L    Comment: Performed at [facility], [address], [place], [place] [address]  Salicylate level     Status: Abnormal   Collection Time: 05/12/23 11:59 AM  Result Value Ref Range   Salicylate Lvl <7.0 (L) 7.0 - 30.0 mg/dL    Comment: Performed at [facility], [address],  [place], [place] [address]  Acetaminophen level     Status: Abnormal   Collection Time: 05/12/23 11:59 AM  Result Value Ref Range   Acetaminophen (Tylenol), Serum <10 (L) 10 - 30 ug/mL    Comment: (NOTE) Therapeutic concentrations vary significantly. A range of 10-30 ug/mL  may be an effective concentration for many patients. However, some  are best treated at concentrations outside of this range. Acetaminophen concentrations >150 ug/mL at 4 hours after ingestion  and >50 ug/mL at  12 hours after ingestion are often associated with  toxic reactions.  Performed at [facility], [address] [place], [place] [address]   CBC with Differential     Status: Abnormal   Collection Time: 05/12/23 12:57 PM  Result Value Ref Range   WBC 7.3 4.0 - 10.5 K/uL   RBC 2.09 (L) 4.22 - 5.81 MIL/uL   Hemoglobin 5.8 (LL) 13.0 - 17.0 g/dL    Comment: REPEATED TO VERIFY VERIFIED WITH NEW SAMPLE THIS CRITICAL RESULT HAS VERIFIED AND BEEN CALLED TO LAURA CARSON,RN BY ZELDA BEECH ON 08 06 2024 AT 1325, AND HAS BEEN READ BACK.     HCT 17.8 (L) 39.0 - 52.0 %   MCV 85.2 80.0 - 100.0 fL   MCH 27.8 26.0 - 34.0 pg   MCHC 32.6 30.0 - 36.0 g/dL   RDW 96.2 95.2 - 84.1 %   Platelets 104 (L) 150 - 400 K/uL   nRBC 0.0 0.0 - 0.2 %   Neutrophils Relative % 73 %   Neutro Abs 5.3 1.7 - 7.7 K/uL   Lymphocytes Relative 16 %   Lymphs Abs 1.2 0.7 - 4.0 K/uL   Monocytes Relative 8 %   Monocytes Absolute 0.6 0.1 - 1.0 K/uL   Eosinophils Relative 3 %   Eosinophils Absolute 0.2 0.0 - 0.5 K/uL   Basophils Relative 0 %   Basophils Absolute 0.0 0.0 - 0.1 K/uL   Immature Granulocytes 0 %   Abs Immature Granulocytes 0.03 0.00 - 0.07 K/uL    Comment: Performed at [facility], [address] [place], [place] [address]  Type and screen [facility]     Status: None   Collection Time: 05/12/23 12:57 PM  Result Value Ref Range   ABO/RH(D) A POS    Antibody Screen NEG    Sample Expiration       05/15/2023,2359 Performed at [facility], [address] [place], [place] [address]   POC occult blood, ED     Status: Abnormal   Collection Time: 05/12/23  2:21 PM  Result Value Ref Range   Fecal Occult Bld POSITIVE (A) NEGATIVE  ABO/Rh     Status: None (Preliminary result)   Collection Time: 05/12/23  3:59 PM  Result Value Ref Range   ABO/RH(D) PENDING    DG Elbow Complete Left  Result Date: 05/12/2023 CLINICAL DATA:  Left elbow pain.  Fall. EXAM: LEFT ELBOW - COMPLETE 3+ VIEW COMPARISON:  None Available. FINDINGS: No definite elevation of the distal anterior humeral fat pad indicating an elbow joint effusion. The posterior fat pad also is not visualized. Moderate peripheral medial elbow degenerative spurring at the coronoid process greater than trochlea. Mild proximal radial head-radial notch of the ulna degenerative spurring. No acute fracture is seen. No dislocation. Mild soft tissue swelling posterior to the distal humerus and posterior to the olecranon. IMPRESSION: 1. No acute fracture is seen. 2. Mild soft tissue swelling posterior to the distal humerus and posterior to the olecranon. 3. Moderate medial elbow degenerative spurring. Electronically Signed   By: Neita Garnet M.D.   On: 05/12/2023 13:18   DG Hip Unilat W or Wo Pelvis 2-3 Views Right  Result Date: 05/12/2023 CLINICAL DATA:  Right hip pain.  Fall. EXAM: DG HIP (WITH OR WITHOUT PELVIS) 2-3V RIGHT COMPARISON:  CT abdomen and pelvis 09/28/2008 FINDINGS: There is severe superomedial right femoroacetabular joint space narrowing. Moderate right femoral head-neck junction circumferential degenerative osteophytes. Mild  right bilateral superolateral acetabular degenerative osteophytosis. Mild left femoroacetabular joint space narrowing and inferior left femoral head-neck junction degenerative spurring. The pubic symphysis joint space is maintained. Minimal bilateral inferior sacroiliac joint space narrowing. No acute fracture or  dislocation. 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 Electronically Signed   By: Neita Garnet M.D.   On: 05/12/2023 13:16   CT Head Wo Contrast  Result Date: 05/12/2023 CLINICAL DATA:  Head trauma, minor (Age >= 65y); Neck trauma (Age >= 65y) EXAM: CT HEAD WITHOUT CONTRAST CT CERVICAL SPINE WITHOUT CONTRAST TECHNIQUE: Multidetector CT imaging of the head and cervical spine was performed following the standard protocol without intravenous contrast. Multiplanar CT image reconstructions of the cervical spine were also generated. RADIATION DOSE REDUCTION: This exam was performed according to the departmental dose-optimization program which includes automated exposure control, adjustment of the mA and/or kV according to patient size and/or use of iterative reconstruction technique. COMPARISON:  CT Head 06/22/21, CT C Spine 06/22/21 FINDINGS: CT HEAD FINDINGS Brain: No evidence of acute infarction, hemorrhage, hydrocephalus, extra-axial collection or mass lesion/mass effect. There is sequela of mild chronic microvascular ischemic change. Vascular: No hyperdense vessel or unexpected calcification. Skull: Normal. Negative for fracture or focal lesion. Sinuses/Orbits: No middle ear or mastoid effusion. Paranasal sinuses are clear. Bilateral lens replacement. Orbits are otherwise unremarkable. Other: None. CT CERVICAL SPINE FINDINGS Alignment: Normal. Skull base and vertebrae: No acute fracture. No primary bone lesion or focal pathologic process. Soft tissues and spinal canal: No prevertebral fluid or swelling. No visible canal hematoma. Disc levels:  No evidence of high-grade spinal canal stenosis. Upper chest: Negative. Other: Known IMPRESSION: 1. No acute intracranial abnormality. 2. No acute fracture or traumatic malalignment of the cervical spine. Electronically Signed   By: Lorenza Cambridge M.D.   On: 05/12/2023 12:12   CT Cervical Spine Wo Contrast  Result Date:  05/12/2023 CLINICAL DATA:  Head trauma, minor (Age >= 65y); Neck trauma (Age >= 65y) EXAM: CT HEAD WITHOUT CONTRAST CT CERVICAL SPINE WITHOUT CONTRAST TECHNIQUE: Multidetector CT imaging of the head and cervical spine was performed following the standard protocol without intravenous contrast. Multiplanar CT image reconstructions of the cervical spine were also generated. RADIATION DOSE REDUCTION: This exam was performed according to the departmental dose-optimization program which includes automated exposure control, adjustment of the mA and/or kV according to patient size and/or use of iterative reconstruction technique. COMPARISON:  CT Head 06/22/21, CT C Spine 06/22/21 FINDINGS: CT HEAD FINDINGS Brain: No evidence of acute infarction, hemorrhage, hydrocephalus, extra-axial collection or mass lesion/mass effect. There is sequela of mild chronic microvascular ischemic change. Vascular: No hyperdense vessel or unexpected calcification. Skull: Normal. Negative for fracture or focal lesion. Sinuses/Orbits: No middle ear or mastoid effusion. Paranasal sinuses are clear. Bilateral lens replacement. Orbits are otherwise unremarkable. Other: None. CT CERVICAL SPINE FINDINGS Alignment: Normal. Skull base and vertebrae: No acute fracture. No primary bone lesion or focal pathologic process. Soft tissues and spinal canal: No prevertebral fluid or swelling. No visible canal hematoma. Disc levels:  No evidence of high-grade spinal canal stenosis. Upper chest: Negative. Other: Known IMPRESSION: 1. No acute intracranial abnormality. 2. No acute fracture or traumatic malalignment of the cervical spine. Electronically Signed   By: Lorenza Cambridge M.D.   On: 05/12/2023 12:12    Pending Labs Unresulted Labs (From admission, onward)     Start     Ordered   05/13/23 0500  Comprehensive metabolic panel  Tomorrow morning,   R  05/12/23 1438   05/13/23 0500  CBC  Tomorrow morning,   R        05/12/23 1438   05/12/23 1429   Prepare RBC (crossmatch)  (Blood Administration Adult)  Once,   R       Question Answer Comment  # of Units 2 units   Transfusion Indications Hemoglobin < 7 gm/dL and symptomatic   Number of Units to Keep Ahead NO units ahead   If emergent release call blood bank Not emergent release      05/12/23 1428   05/12/23 1429  Phosphorus  Add-on,   AD        05/12/23 1438   05/12/23 1314  Magnesium  ONCE - STAT,   STAT        05/12/23 1313   05/12/23 1150  Rapid urine drug screen (hospital performed)  Once,   STAT        05/12/23 1150   05/12/23 1150  CBC with Differential/Platelet  Once,   STAT        05/12/23 1150   05/12/23 1150  Urinalysis, Routine w reflex microscopic -Urine, Clean Catch  Once,   URGENT       Question:  Specimen Source  Answer:  Urine, Clean Catch   05/12/23 1150            Vitals/Pain Today's Vitals   05/12/23 1400 05/12/23 1415 05/12/23 1513 05/12/23 1513  BP: (!) 125/50 (!) 128/42 (!) 120/54   Pulse: (!) 136 70 74   Resp: (!) 27 (!) 24 19   Temp:    (!) 97.2 F (36.2 C)  TempSrc:    Oral  SpO2: 100% 100% 100%   Weight:      Height:      PainSc:        Isolation Precautions No active isolations  Medications Medications  potassium chloride 10 mEq in 100 mL IVPB (10 mEq Intravenous New Bag/Given 05/12/23 1529)  pantoprozole (PROTONIX) 80 mg /NS 100 mL infusion (has no administration in time range)  0.9 %  sodium chloride infusion (Manually program via Guardrails IV Fluids) (has no administration in time range)  lactated ringers bolus 1,000 mL (has no administration in time range)  sodium bicarbonate 150 mEq in dextrose 5 % 1,150 mL infusion ( Intravenous New Bag/Given 05/12/23 1449)  sodium chloride flush (NS) 0.9 % injection 3 mL (3 mLs Intravenous Not Given 05/12/23 1450)  acetaminophen (TYLENOL) tablet 650 mg (has no administration in time range)    Or  acetaminophen (TYLENOL) suppository 650 mg (has no administration in time range)  amiodarone  (PACERONE) tablet 200 mg (has no administration in time range)  lactated ringers bolus 500 mL (0 mLs Intravenous Stopped 05/12/23 1446)  lactated ringers bolus 1,000 mL (1,000 mLs Intravenous New Bag/Given 05/12/23 1417)  potassium chloride (KLOR-CON) packet 80 mEq (80 mEq Oral Given 05/12/23 1411)  pantoprazole (PROTONIX) 80 mg /NS 100 mL IVPB (80 mg Intravenous New Bag/Given 05/12/23 1532)    Mobility walks with person assist     Focused Assessments Renal Assessment Handoff:  Hemodialysis Schedule: none Last Hemodialysis date and time: none   Restricted appendage:  none   R Recommendations: See Admitting Provider Note  Report given to:   Additional Notes: [id number]

## 2023-05-12 NOTE — Progress Notes (Signed)
Repeat K low, IV runs reordered.  Renal US with atrophic right kidney, no hydronephrosis on the left. Recheck K levels.
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
## 2023-05-12 NOTE — ED Notes (Signed)
Pt was incontinent of BM. Pt was cleaned and sheets were changed and brief placed on pt

## 2023-05-12 NOTE — Plan of Care (Signed)
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
## 2023-05-12 NOTE — ED Notes (Signed)
Called floor and verified they were ready for the patient.

## 2023-05-13 ENCOUNTER — Encounter (HOSPITAL_COMMUNITY): Payer: Self-pay | Admitting: Internal Medicine

## 2023-05-13 DIAGNOSIS — G9340 Encephalopathy, unspecified: Secondary | ICD-10-CM | POA: Diagnosis not present

## 2023-05-13 DIAGNOSIS — K766 Portal hypertension: Secondary | ICD-10-CM | POA: Diagnosis not present

## 2023-05-13 DIAGNOSIS — K7460 Unspecified cirrhosis of liver: Secondary | ICD-10-CM | POA: Diagnosis not present

## 2023-05-13 DIAGNOSIS — D649 Anemia, unspecified: Secondary | ICD-10-CM

## 2023-05-13 DIAGNOSIS — E871 Hypo-osmolality and hyponatremia: Secondary | ICD-10-CM

## 2023-05-13 DIAGNOSIS — I4891 Unspecified atrial fibrillation: Secondary | ICD-10-CM

## 2023-05-13 DIAGNOSIS — K7030 Alcoholic cirrhosis of liver without ascites: Secondary | ICD-10-CM | POA: Diagnosis not present

## 2023-05-13 DIAGNOSIS — N179 Acute kidney failure, unspecified: Secondary | ICD-10-CM | POA: Diagnosis not present

## 2023-05-13 DIAGNOSIS — I480 Paroxysmal atrial fibrillation: Secondary | ICD-10-CM | POA: Diagnosis not present

## 2023-05-13 LAB — BASIC METABOLIC PANEL
Anion gap: 18 — ABNORMAL HIGH (ref 5–15)
Anion gap: 18 — ABNORMAL HIGH (ref 5–15)
Anion gap: 17 — ABNORMAL HIGH (ref 5–15)
Anion gap: 16 — ABNORMAL HIGH (ref 5–15)
BUN: 68 mg/dL — ABNORMAL HIGH (ref 8–23)
BUN: 70 mg/dL — ABNORMAL HIGH (ref 8–23)
BUN: 67 mg/dL — ABNORMAL HIGH (ref 8–23)
BUN: 71 mg/dL — ABNORMAL HIGH (ref 8–23)
CO2: 24 mmol/L (ref 22–32)
CO2: 25 mmol/L (ref 22–32)
CO2: 21 mmol/L — ABNORMAL LOW (ref 22–32)
CO2: 23 mmol/L (ref 22–32)
Calcium: 6.8 mg/dL — ABNORMAL LOW (ref 8.9–10.3)
Calcium: 6.9 mg/dL — ABNORMAL LOW (ref 8.9–10.3)
Calcium: 6.8 mg/dL — ABNORMAL LOW (ref 8.9–10.3)
Calcium: 6.7 mg/dL — ABNORMAL LOW (ref 8.9–10.3)
Chloride: 92 mmol/L — ABNORMAL LOW (ref 98–111)
Chloride: 91 mmol/L — ABNORMAL LOW (ref 98–111)
Chloride: 96 mmol/L — ABNORMAL LOW (ref 98–111)
Chloride: 96 mmol/L — ABNORMAL LOW (ref 98–111)
Creatinine, Ser: 9.15 mg/dL — ABNORMAL HIGH (ref 0.61–1.24)
Creatinine, Ser: 9.28 mg/dL — ABNORMAL HIGH (ref 0.61–1.24)
Creatinine, Ser: 9.48 mg/dL — ABNORMAL HIGH (ref 0.61–1.24)
Creatinine, Ser: 9.46 mg/dL — ABNORMAL HIGH (ref 0.61–1.24)
GFR, Estimated: 6 mL/min — ABNORMAL LOW (ref >60)
GFR, Estimated: 5 mL/min — ABNORMAL LOW (ref >60)
GFR, Estimated: 5 mL/min — ABNORMAL LOW (ref >60)
GFR, Estimated: 5 mL/min — ABNORMAL LOW (ref >60)
Glucose, Bld: 120 mg/dL — ABNORMAL HIGH (ref 70–99)
Glucose, Bld: 124 mg/dL — ABNORMAL HIGH (ref 70–99)
Glucose, Bld: 139 mg/dL — ABNORMAL HIGH (ref 70–99)
Glucose, Bld: 150 mg/dL — ABNORMAL HIGH (ref 70–99)
Potassium: <2 mmol/L — CL (ref 3.5–5.1)
Potassium: 2.9 mmol/L — ABNORMAL LOW (ref 3.5–5.1)
Potassium: 2.6 mmol/L — CL (ref 3.5–5.1)
Potassium: 3 mmol/L — ABNORMAL LOW (ref 3.5–5.1)
Sodium: 134 mmol/L — ABNORMAL LOW (ref 135–145)
Sodium: 134 mmol/L — ABNORMAL LOW (ref 135–145)
Sodium: 134 mmol/L — ABNORMAL LOW (ref 135–145)
Sodium: 135 mmol/L (ref 135–145)

## 2023-05-13 LAB — CBC
HCT: 20.8 % — ABNORMAL LOW (ref 39.0–52.0)
HCT: 21.8 % — ABNORMAL LOW (ref 39.0–52.0)
Hemoglobin: 7 g/dL — ABNORMAL LOW (ref 13.0–17.0)
Hemoglobin: 7.3 g/dL — ABNORMAL LOW (ref 13.0–17.0)
MCH: 27.8 pg (ref 26.0–34.0)
MCH: 28.1 pg (ref 26.0–34.0)
MCHC: 33.7 g/dL (ref 30.0–36.0)
MCHC: 33.5 g/dL (ref 30.0–36.0)
MCV: 82.5 fL (ref 80.0–100.0)
MCV: 83.8 fL (ref 80.0–100.0)
Platelets: 98 10*3/uL — ABNORMAL LOW (ref 150–400)
Platelets: 94 10*3/uL — ABNORMAL LOW (ref 150–400)
RBC: 2.52 MIL/uL — ABNORMAL LOW (ref 4.22–5.81)
RBC: 2.6 MIL/uL — ABNORMAL LOW (ref 4.22–5.81)
RDW: 14.6 % (ref 11.5–15.5)
RDW: 15 % (ref 11.5–15.5)
WBC: 7.1 10*3/uL (ref 4.0–10.5)
WBC: 7.2 10*3/uL (ref 4.0–10.5)
nRBC: 0 % (ref 0.0–0.2)
nRBC: 0 % (ref 0.0–0.2)

## 2023-05-13 LAB — URINALYSIS, ROUTINE W REFLEX MICROSCOPIC
Bilirubin Urine: NEGATIVE
Glucose, UA: NEGATIVE mg/dL
Ketones, ur: NEGATIVE mg/dL
Leukocytes,Ua: NEGATIVE
Nitrite: NEGATIVE
Protein, ur: 30 mg/dL — AB
Specific Gravity, Urine: 1.011 (ref 1.005–1.030)
pH: 5 (ref 5.0–8.0)

## 2023-05-13 LAB — PHOSPHORUS: Phosphorus: 5.9 mg/dL — ABNORMAL HIGH (ref 2.5–4.6)

## 2023-05-13 LAB — MAGNESIUM: Magnesium: 2 mg/dL (ref 1.7–2.4)

## 2023-05-13 LAB — PROTIME-INR
INR: 1.6 — ABNORMAL HIGH (ref 0.8–1.2)
Prothrombin Time: 19.6 seconds — ABNORMAL HIGH (ref 11.4–15.2)

## 2023-05-13 MED ORDER — POTASSIUM CHLORIDE CRYS ER 20 MEQ PO TBCR: 40.0000 meq | EXTENDED_RELEASE_TABLET | Freq: Once | ORAL | Status: DC

## 2023-05-13 MED ORDER — SODIUM CHLORIDE 0.9 % IV SOLN: INTRAVENOUS | Status: AC

## 2023-05-13 MED ORDER — SODIUM CHLORIDE 0.9 % IV SOLN
1.0000 g | INTRAVENOUS | Status: DC
  Administered 2023-05-13 – 2023-05-14 (×2): 1 g via INTRAVENOUS
  Filled 2023-05-13 (×2): qty 10

## 2023-05-13 MED ORDER — POTASSIUM CHLORIDE 10 MEQ/100ML IV SOLN: 10.0000 meq | INTRAVENOUS | Status: DC

## 2023-05-13 MED ORDER — POTASSIUM CHLORIDE 10 MEQ/100ML IV SOLN
10.0000 meq | INTRAVENOUS | Status: AC
  Administered 2023-05-13 (×4): 10 meq via INTRAVENOUS
  Filled 2023-05-13 (×4): qty 100

## 2023-05-13 MED ORDER — LACTULOSE 10 GM/15ML PO SOLN
10.0000 g | Freq: Every day | ORAL | Status: DC
  Administered 2023-05-13 – 2023-05-15 (×3): 10 g via ORAL
  Filled 2023-05-13 (×3): qty 30

## 2023-05-13 MED ORDER — POTASSIUM CHLORIDE CRYS ER 20 MEQ PO TBCR
80.0000 meq | EXTENDED_RELEASE_TABLET | Freq: Once | ORAL | Status: AC
  Administered 2023-05-13: 80 meq via ORAL
  Filled 2023-05-13: qty 4

## 2023-05-13 NOTE — Consult Note (Addendum)
Consultation Note   Referring Provider:  [facility] PCP: Lucky Cowboy, MD Primary Gastroenterologist: Ileene Patrick, MD        Reason for Consultation: anemia, cirrhosis  DOA: 05/12/2023         Hospital Day: 2   ASSESSMENT & PLAN   Patient is a 77 y.o. 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 No history of ascites or varices. Admitted with altered mental status , AKI / rhabdomyolysis and severe anemia / FOBT+. Denies overt GI bleeding. Occult GI bleeding for portal HTN gastropathy?  MELD 3.0: insufficient labs to calculate -Getting Sandostatin ( is this for renal purposes? )  -Starting Rocephin Q 24 hours.  -Check INR -Negative HCC screening within last 6 months -Only small amount of perihepatic ascites seen on renal US but will get abdominal US for further evaluation. Diagnostic tap is possible. -Continue IV PPI. BID should suffice but already started on infusion so will leave for now.  -Lactulose 10 grams daily . Can increase later if needed but he reports already having loose stool. Has mild asterixis on exam but also uremic) -2 gram sodium diet when diet is started. NPO for now.  -Will likely need EGD this admission     AKI  / Rhabomyolysis -Getting IV fluids. Cr hasn't yet improved.  -CK improving  Severe hypokalemia.  -Repletion in progress  A-Fib.  Home Eliquis is on hold. He isn't sure of when he last took a dose.   See PMH for additional medical history  ------------------------------------------------------------------------------------------------------    [facility] Attending   I have taken an interval history, reviewed the chart and examined the patient. I agree with the Advanced Practitioner's note, impression and recommendations +  following additions:  Octreotide was empiric due to "bleeding" in cirrhosis - I have stopped it.  Continue aggressive supportive care.  Timing of possible EGD not clear to me yet - it is not urgent.  I suspect decreased Hgb is multifactorial and part blood loss and part AKI  Iva Boop, MD, [facility] See Loretha Stapler on call - gastroenterology for best contact person 05/13/2023 3:52 PM      HISTORY OF PRESENT ILLNESS   Followed by Dr. Adela Lank, last office visit 12/08/22.   Patient brought to ED yesterday for altered mental status. Found confused in bed. Admitted with electrolyte imbalances / Rhabdomyolysis and uremia. Also found to have had a major drop in hgb with Heme + stool. Denies NSAID use. He hasn't had any Etoh in several years. He says he started having diarrhea in the hospital. At home he wasn't having any diarrhea. He hasn't had any abdominal pain. He hasn't seen any blood in stools or dark stools.   ED / Admission workup notable for :   WBC 7.3 Hgb 5.8 ( 11 end of May) Platelets 104 K+ <2 BUN 70, cr 9.84 ( up form 1.6 in May) Ammonia 25 Etoh < 10 Salicylate < 7 AST 123 / ALT 38 Albumin 2.7 Tbili 2.1 CK 3600 No acute findings on head CT Mild perihepatic ascites on renal US  Previous GI Evaluations   **Most recent endoscopic studies:    July 2023 EGD for newly diagnosed cirrhosis -- Esophagogastric landmarks identified. - Z-line regular. - Normal esophagus otherwise - no varices - Erythematous mucosa in the gastric fundus and gastric body, suspect mild portal hypertensive gastritis - Normal stomach otherwise - no varices. Biopsies taken to rule out H pylori - Normal examined duodenum.  July 2023 cirrhosis - polyp surveillance - Multiple colonic angiodysplastic lesions. - Congested mucosa in the right colon likely from portal hypertension. - One 4 mm polyp in the sigmoid colon, removed with a cold snare. Resected and retrieved.. Internal  hemorrhoids. Exam o/w normal.   Diagnosis 1. Surgical [P], gastric antrum and gastric body - ANTRAL AND OXYNTIC MUCOSA WITH HYPEREMIA. - NO HELICOBACTER PYLORI IDENTIFIED. 2. Surgical [P], colon, sigmoid, polyp (1) - TUBULAR ADENOMA WITHOUT HIGH GRADE DYSPLASIA  Labs and Imaging: Recent Labs    05/12/23 1257 05/13/23 0055 05/13/23 0626  WBC 7.3 7.1 7.2  HGB 5.8* 7.0* 7.3*  HCT 17.8* 20.8* 21.8*  PLT 104* 98* 94*   Recent Labs    05/12/23 2157 05/13/23 0057 05/13/23 0626  NA 134* 134* 134*  K <2.0* <2.0* 2.9*  CL 95* 92* 91*  CO2 24 24 25   GLUCOSE 124* 120* 124*  BUN 69* 68* 70*  CREATININE 9.27* 9.15* 9.28*  CALCIUM 6.9* 6.8* 6.9*   Recent Labs    05/12/23 1159  PROT 5.4*  ALBUMIN 2.7*  AST 123*  ALT 38  ALKPHOS 50  BILITOT 2.1*    INR 1.6  Past Medical History:  Diagnosis Date   BPH (benign prostatic hyperplasia)    Fibromyalgia    GERD (gastroesophageal reflux disease)    Hyperlipidemia    Hypertension    Hypogonadism male    IBS (irritable bowel syndrome)    OSA (obstructive sleep apnea)     Past Surgical History:  Procedure Laterality Date    nasal smr np3  1985   BUBBLE STUDY  11/07/2022   Procedure: BUBBLE STUDY;  Surgeon: Orpah Cobb, MD;  Location: [facility] ENDOSCOPY;  Service: Cardiovascular;;   CARDIOVERSION N/A 11/07/2022   Procedure: CARDIOVERSION;  Surgeon: Orpah Cobb, MD;  Location: [facility] ENDOSCOPY;  Service: Cardiovascular;  Laterality: N/A;   CATARACT EXTRACTION, BILATERAL Bilateral 2021   Dr. Dione Booze, L 5/27, R 3/25   COLONOSCOPY     KNEE ARTHROSCOPY Left 1999   SKIN CANCER EXCISION  2020   nose   SPINE SURGERY  2007   L5 S 1 Disk   SPINE SURGERY  09/2019   Compression fracture stabilization by Dr. Lovell Sheehan   TEE WITHOUT CARDIOVERSION N/A 11/07/2022   Procedure: TRANSESOPHAGEAL ECHOCARDIOGRAM (TEE);  Surgeon: Orpah Cobb, MD;  Location: [facility] ENDOSCOPY;  Service: Cardiovascular;  Laterality: N/A;   VASECTOMY  1983    Family History   Problem Relation Age of Onset   Cancer Mother        breast   Heart attack Father    Cancer Father        lung   Diabetes Father    Heart disease Sister    Arthritis Sister    Atrial fibrillation Sister    Atrial fibrillation Sister    Atrial fibrillation Sister    Atrial fibrillation Sister    Colon cancer Neg Hx    Colon polyps Neg Hx    Sleep apnea Neg Hx    Esophageal cancer Neg Hx    Pancreatic cancer Neg Hx  Stomach cancer Neg Hx     Prior to Admission medications   Medication Sig Start Date End Date Taking? Authorizing Provider  acetaminophen (TYLENOL) 650 MG CR tablet Take 650 mg by mouth 2 (two) times daily. Take 1 tablet by mouth in the Morning, and at Bedtime.   Yes [provider]  amiodarone (PACERONE) 200 MG tablet Take 1 tablet (200 mg total) by mouth 2 (two) times daily for 7 days, THEN 1 tablet (200 mg total) daily. 02/16/23 05/24/23 Yes Agbor-Etang, Arlys John, MD  apixaban (ELIQUIS) 5 MG TABS tablet Take 1 tablet (5 mg total) by mouth 2 (two) times daily. 11/07/22  Yes Tyrone Nine, MD  Cholecalciferol (VITAMIN D3) 50 MCG (2000 UT) TABS 1 capsule DAILY (route: oral) 11/08/22  Yes [provider]  Flaxseed, Linseed, (FLAX SEED OIL PO) Take by mouth.   Yes [provider]  LORazepam (ATIVAN) 1 MG tablet TAKE 1 TABLET BY MOUTH EACH NIGHT AT BEDTIME FOR SLEEP 05/12/22  Yes Cottle, Steva Ready., MD  losartan (COZAAR) 25 MG tablet Take 1 tablet (25 mg total) by mouth daily. 11/07/22  Yes Tyrone Nine, MD  Magnesium 400 MG TABS 1 capsule DAILY (route: oral) 11/14/22  Yes [provider]  methocarbamol (ROBAXIN) 500 MG tablet TAKE 1 TABLET BY MOUTH EVERY 6 HOURS AS NEEDED FOR MUSCLE SPASMS 05/13/22  Yes Raynelle Dick, NP  metoprolol succinate (TOPROL XL) 50 MG 24 hr tablet Take 1 tablet (50 mg total) by mouth daily. Take with or immediately following a meal. 11/07/22  Yes Tyrone Nine, MD  Multiple Vitamin (MULTIVITAMIN) capsule Take 1 capsule by  mouth daily.   Yes [provider]  rosuvastatin (CRESTOR) 40 MG tablet Take  1 tablet  Daily  for Cholesterol 03/25/23  Yes Lucky Cowboy, MD  sertraline (ZOLOFT) 100 MG tablet Take 1 tablet (100 mg total) by mouth in the morning and at bedtime. 10/09/22  Yes Cottle, Steva Ready., MD  torsemide (DEMADEX) 20 MG tablet Take 2 tablets (40 mg) every Morning for Fluid  Retention & Leg Swelling 03/04/23  Yes Lucky Cowboy, MD  CARTIA XT 180 MG 24 hr capsule Take 180 mg by mouth daily. Patient not taking: Reported on 05/12/2023 02/18/23   [provider]    Current Facility-Administered Medications  Medication Dose Route Frequency Provider Last Rate Last Admin   0.9 %  sodium chloride infusion   Intravenous Continuous Terrial Rhodes, MD 100 mL/hr at 05/13/23 0652 Infusion Verify at 05/13/23 5284   acetaminophen (TYLENOL) tablet 650 mg  650 mg Oral Q6H PRN Synetta Fail, MD   650 mg at 05/13/23 1324   Or   acetaminophen (TYLENOL) suppository 650 mg  650 mg Rectal Q6H PRN Synetta Fail, MD       amiodarone (PACERONE) tablet 200 mg  200 mg Oral Daily Synetta Fail, MD   200 mg at 05/13/23 0913   octreotide (SANDOSTATIN) 500 mcg in sodium chloride 0.9 % 250 mL (2 mcg/mL) infusion  50 mcg/hr Intravenous Continuous Synetta Fail, MD 25 mL/hr at 05/13/23 0652 50 mcg/hr at 05/13/23 0652   pantoprozole (PROTONIX) 80 mg /NS 100 mL infusion  8 mg/hr Intravenous Continuous Rondel Baton, MD 10 mL/hr at 05/13/23 0652 8 mg/hr at 05/13/23 0652   sodium chloride flush (NS) 0.9 % injection 3 mL  3 mL Intravenous Q12H Synetta Fail, MD   3 mL at 05/12/23 2200    Allergies  as of 05/12/2023 - Review Complete 05/12/2023  Allergen Reaction Noted   Asa [aspirin] Diarrhea, Nausea Only, and Other (See Comments) 08/09/2013   Atorvastatin Other (See Comments) 12/12/2014   Celebrex [celecoxib] Other (See Comments) 11/08/2013   Codeine Nausea And Vomiting 08/09/2013    Savella [milnacipran hcl] Other (See Comments) 07/15/2013   Viagra [sildenafil citrate] Other (See Comments) 07/15/2013   Penicillins Rash 06/22/2021    Social History   Socioeconomic History   Marital status: Divorced    Spouse name: Annice Pih   Number of children: Not on file   Years of education: Not on file   Highest education level: Not on file  Occupational History   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  Tobacco Use   Smoking status: Former    Types: Cigarettes   Smokeless tobacco: Never  Vaping Use   Vaping status: Never Used  Substance and Sexual Activity   Alcohol use: Not Currently   Drug use: Never   Sexual activity: Not Currently  Other Topics Concern   Not on file  Social History Narrative   ** Merged History Encounter **       Social Determinants of Health   Financial Resource Strain: Not on file  Food Insecurity: No Food Insecurity (05/12/2023)   Hunger Vital Sign    Worried About Running Out of Food in the Last Year: Never true    Ran Out of Food in the Last Year: Never true  Transportation Needs: No Transportation Needs (05/12/2023)   PRAPARE - Administrator, Civil Service (Medical): No    Lack of Transportation (Non-Medical): No  Physical Activity: Not on file  Stress: Not on file  Social Connections: Not on file  Intimate Partner Violence: Not At Risk (05/12/2023)   Humiliation, Afraid, Rape, and Kick questionnaire    Fear of Current or Ex-Partner: No    Emotionally Abused: No    Physically Abused: No    Sexually Abused: No     Code Status   Code Status: Full Code  Review of Systems: All systems reviewed and negative except where noted in HPI.  Physical Exam: Vital signs in last 24 hours: Temp:  [97.2 F (36.2 C)-99.1 F (37.3 C)] 97.9 F (36.6 C) (08/07 0811) Pulse Rate:  [65-136] 70 (08/07 0811) Resp:  [18-30] 19 (08/07 0811) BP: (93-144)/(41-97) 124/60 (08/07 0811) SpO2:  [90 %-100 %] 97 % (08/07 0811) Weight:  [90.7 kg] 90.7 kg (08/06  1112) Last BM Date : 05/12/23  General:  Pleasant male in NAD Psych:  Cooperative. Normal mood and affect Eyes: Pupils equal Ears:  Normal auditory acuity Nose: No deformity, discharge or lesions Neck:  Supple, no masses felt Lungs:  Clear to auscultation.  Heart:  Regular rate.  Abdomen:  Soft, nondistended, nontender, active bowel sounds, no masses felt Msk: Symmetrical without gross deformities.  Neurologic:  Alert, oriented, grossly normal neurologically, sluggish verbal responses, mild asterixis Skin:  Intact without significant lesions.        Willette Cluster, NP-C @  05/13/2023, 9:34 AM

## 2023-05-13 NOTE — Progress Notes (Signed)
   05/13/23 0921  TOC Brief Assessment  Insurance and Status Reviewed [facility] Medicare)  Patient has primary care physician Yes (Busy - Default status  Lucky Cowboy, MD)  Home environment has been reviewed From Home Alone ( He and Wife are separated)  Prior level of function: independent  Prior/Current Home Services No current home services  Social Determinants of Health Reivew SDOH reviewed no interventions necessary  Readmission risk has been reviewed Yes (20%)  Transition of care needs no transition of care needs at this time    No TOC needs identified at this time but will follow. Please place [facility] consult if needed

## 2023-05-13 NOTE — Progress Notes (Signed)
Subjective:  possibly ?? a little more oriented-  only 100 of uop recorded but he says he has made lots -  crt not down much at all-  potassium better  Objective Vital signs in last 24 hours: Vitals:   05/13/23 0347 05/13/23 0811 05/13/23 1042 05/13/23 1045  BP:  124/60    Pulse:  70    Resp: 20 19    Temp:  97.9 F (36.6 C)    TempSrc: Oral Oral    SpO2:  97% (!) 88% 94%  Weight:      Height:       Weight change:   Intake/Output Summary (Last 24 hours) at 05/13/2023 1100 Last data filed at 05/13/2023 4098 Gross per 24 hour  Intake 1833.43 ml  Output 100 ml  Net 1733.43 ml    Assessment/Plan: 77 year old WM with cirrhosis , HFrEF, afib on appropriate meds-  also stage 3 CKD- and solitary functioning kidney  now presents found down with AKI 1.Renal- baseline CKD-  crt 1.4-1.6-  last in May of 24.  Now with A on CRF in the setting of being found down, rhabdo, hypotension on ARB.  Renal US showed solitary functioning kidney without hydro-  have not had urine for review -  will be sent now.   aggressive hydration ,   hold ARB- no absolute indications for RRT-  not great that crt has not improved but making urine now so that is better.   now edematous -  IVF has been lowered to 100 per hour- will put a stop time on the ivf-  CK has trended down- will cont to follow  2. Hypertension/volume  - dry and hypotensive- aggressive hydration- tank now appears full-  put stop time on the ivf 3. Hypokalemia-  aggressive repletion of this as well -  got another 80 PO and 40 IV today   check level later  4. Anemia  - is pretty low to be CKD related-  suspecting a GIB-  transfused 2 units on 8/6-  did have appropriate bump-  GI has started octreotide and lactulose      A     Labs: Basic Metabolic Panel: Recent Labs  Lab 05/12/23 1602 05/12/23 1745 05/13/23 0057 05/13/23 0626 05/13/23 0957  NA  --    < > 134* 134* 134*  K  --    < > <2.0* 2.9* 2.6*  CL  --    < > 92* 91* 96*   CO2  --    < > 24 25 21*  GLUCOSE  --    < > 120* 124* 139*  BUN  --    < > 68* 70* 67*  CREATININE  --    < > 9.15* 9.28* 9.48*  CALCIUM  --    < > 6.8* 6.9* 6.8*  PHOS 5.2*  --   --  5.9*  --    < > = values in this interval not displayed.   Liver Function Tests: Recent Labs  Lab 05/12/23 1159  AST 123*  ALT 38  ALKPHOS 50  BILITOT 2.1*  PROT 5.4*  ALBUMIN 2.7*   No results for input(s): "LIPASE", "AMYLASE" in the last 168 hours. Recent Labs  Lab 05/12/23 1159  AMMONIA 25   CBC: Recent Labs  Lab 05/12/23 1257 05/13/23 0055 05/13/23 0626  WBC 7.3 7.1 7.2  NEUTROABS 5.3  --   --   HGB 5.8* 7.0* 7.3*  HCT 17.8* 20.8* 21.8*  MCV 85.2 82.5 83.8  PLT 104* 98* 94*   Cardiac Enzymes: Recent Labs  Lab 05/12/23 1159 05/13/23 0057  CKTOTAL 3,606* 2,587*   CBG: No results for input(s): "GLUCAP" in the last 168 hours.  Iron Studies: No results for input(s): "IRON", "TIBC", "TRANSFERRIN", "FERRITIN" in the last 72 hours. Studies/Results: US RENAL  Result Date: 05/12/2023 CLINICAL DATA:  Acute kidney injury EXAM: RENAL / URINARY TRACT ULTRASOUND COMPLETE COMPARISON:  11/07/2022, CT 06/22/2019 FINDINGS: Right Kidney: Atrophic and not well visualized, similar to prior examination. Left Kidney: Renal measurements: 14.4 x 7.0 x 6.7 cm = volume: 353 mL. Echogenicity within normal limits. No mass or hydronephrosis visualized. Bladder: Appears normal for degree of bladder distention. Other: The liver contour is nodular in keeping with changes of cirrhosis. Mild perihepatic ascites is present. IMPRESSION: 1. Atrophic, nonvisualized right kidney. Normal sonographic appearance of the left kidney. 2. Cirrhosis with mild perihepatic ascites. Electronically Signed   By: Helyn Numbers M.D.   On: 05/12/2023 20:38   DG Elbow Complete Left  Result Date: 05/12/2023 CLINICAL DATA:  Left elbow pain.  Fall. EXAM: LEFT ELBOW - COMPLETE 3+ VIEW COMPARISON:  None Available. FINDINGS: No definite  elevation of the distal anterior humeral fat pad indicating an elbow joint effusion. The posterior fat pad also is not visualized. Moderate peripheral medial elbow degenerative spurring at the coronoid process greater than trochlea. Mild proximal radial head-radial notch of the ulna degenerative spurring. No acute fracture is seen. No dislocation. Mild soft tissue swelling posterior to the distal humerus and posterior to the olecranon. IMPRESSION: 1. No acute fracture is seen. 2. Mild soft tissue swelling posterior to the distal humerus and posterior to the olecranon. 3. Moderate medial elbow degenerative spurring. Electronically Signed   By: Neita Garnet M.D.   On: 05/12/2023 13:18   DG Hip Unilat W or Wo Pelvis 2-3 Views Right  Result Date: 05/12/2023 CLINICAL DATA:  Right hip pain.  Fall. EXAM: DG HIP (WITH OR WITHOUT PELVIS) 2-3V RIGHT COMPARISON:  CT abdomen and pelvis 09/28/2008 FINDINGS: There is severe superomedial right femoroacetabular joint space narrowing. Moderate right femoral head-neck junction circumferential degenerative osteophytes. Mild right bilateral superolateral acetabular degenerative osteophytosis. Mild left femoroacetabular joint space narrowing and inferior left femoral head-neck junction degenerative spurring. The pubic symphysis joint space is maintained. Minimal bilateral inferior sacroiliac joint space narrowing. No acute fracture or dislocation. 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 Electronically Signed   By: Neita Garnet M.D.   On: 05/12/2023 13:16   CT Head Wo Contrast  Result Date: 05/12/2023 CLINICAL DATA:  Head trauma, minor (Age >= 65y); Neck trauma (Age >= 65y) EXAM: CT HEAD WITHOUT CONTRAST CT CERVICAL SPINE WITHOUT CONTRAST TECHNIQUE: Multidetector CT imaging of the head and cervical spine was performed following the standard protocol without intravenous contrast. Multiplanar CT image reconstructions of the cervical spine  were also generated. RADIATION DOSE REDUCTION: This exam was performed according to the departmental dose-optimization program which includes automated exposure control, adjustment of the mA and/or kV according to patient size and/or use of iterative reconstruction technique. COMPARISON:  CT Head 06/22/21, CT C Spine 06/22/21 FINDINGS: CT HEAD FINDINGS Brain: No evidence of acute infarction, hemorrhage, hydrocephalus, extra-axial collection or mass lesion/mass effect. There is sequela of mild chronic microvascular ischemic change. Vascular: No hyperdense vessel or unexpected calcification. Skull: Normal. Negative for fracture or focal lesion. Sinuses/Orbits: No middle ear or mastoid effusion. Paranasal sinuses are clear. Bilateral lens replacement. Orbits are  otherwise unremarkable. Other: None. CT CERVICAL SPINE FINDINGS Alignment: Normal. Skull base and vertebrae: No acute fracture. No primary bone lesion or focal pathologic process. Soft tissues and spinal canal: No prevertebral fluid or swelling. No visible canal hematoma. Disc levels:  No evidence of high-grade spinal canal stenosis. Upper chest: Negative. Other: Known IMPRESSION: 1. No acute intracranial abnormality. 2. No acute fracture or traumatic malalignment of the cervical spine. Electronically Signed   By: Lorenza Cambridge M.D.   On: 05/12/2023 12:12   CT Cervical Spine Wo Contrast  Result Date: 05/12/2023 CLINICAL DATA:  Head trauma, minor (Age >= 65y); Neck trauma (Age >= 65y) EXAM: CT HEAD WITHOUT CONTRAST CT CERVICAL SPINE WITHOUT CONTRAST TECHNIQUE: Multidetector CT imaging of the head and cervical spine was performed following the standard protocol without intravenous contrast. Multiplanar CT image reconstructions of the cervical spine were also generated. RADIATION DOSE REDUCTION: This exam was performed according to the departmental dose-optimization program which includes automated exposure control, adjustment of the mA and/or kV according to  patient size and/or use of iterative reconstruction technique. COMPARISON:  CT Head 06/22/21, CT C Spine 06/22/21 FINDINGS: CT HEAD FINDINGS Brain: No evidence of acute infarction, hemorrhage, hydrocephalus, extra-axial collection or mass lesion/mass effect. There is sequela of mild chronic microvascular ischemic change. Vascular: No hyperdense vessel or unexpected calcification. Skull: Normal. Negative for fracture or focal lesion. Sinuses/Orbits: No middle ear or mastoid effusion. Paranasal sinuses are clear. Bilateral lens replacement. Orbits are otherwise unremarkable. Other: None. CT CERVICAL SPINE FINDINGS Alignment: Normal. Skull base and vertebrae: No acute fracture. No primary bone lesion or focal pathologic process. Soft tissues and spinal canal: No prevertebral fluid or swelling. No visible canal hematoma. Disc levels:  No evidence of high-grade spinal canal stenosis. Upper chest: Negative. Other: Known IMPRESSION: 1. No acute intracranial abnormality. 2. No acute fracture or traumatic malalignment of the cervical spine. Electronically Signed   By: Lorenza Cambridge M.D.   On: 05/12/2023 12:12   Medications: Infusions:  sodium chloride 100 mL/hr at 05/13/23 0652   cefTRIAXone (ROCEPHIN)  IV     octreotide (SANDOSTATIN) 500 mcg in sodium chloride 0.9 % 250 mL (2 mcg/mL) infusion 50 mcg/hr (05/13/23 4540)   pantoprazole 8 mg/hr (05/13/23 9811)    Scheduled Medications:  amiodarone  200 mg Oral Daily   lactulose  10 g Oral Daily   sodium chloride flush  3 mL Intravenous Q12H    have reviewed scheduled and prn medications.  Physical Exam: General: maybe a little more oriented this AM-  says has made lots of urine Heart: irreg Lungs: dec BS at bases Abdomen: soft, non tender  Extremities: pitting edema     05/13/2023,11:00 AM  LOS: 1 day

## 2023-05-13 NOTE — Progress Notes (Signed)
Pt. Refuses cpap  ? ?

## 2023-05-13 NOTE — Progress Notes (Signed)
PROGRESS NOTE        PATIENT DETAILS Name: Cody Hines. Age: 77 y.o. Sex: male Date of Birth: 1946/01/18 Admit Date: 05/12/2023 Admitting Physician Synetta Fail, MD WUJ:[id number], Cody Noa, MD  Brief Summary: Patient is a 77 y.o.  male with history of HTN, HLD, PAF on Eliquis, cirrhosis, EtOH use, depression, mild cognitive impairment who was found by significant other confused (had not heard from him for 10 days-separated from wife).  He was found to have severe anemia, AKI, rhabdomyolysis, encephalopathy and subsequently admitted to the hospitalist service.  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  Significant microbiology data: None  Procedures: None  Consults: Nephrology  Subjective: Lying comfortably in bed-denies any chest pain-or shortness of breath.  No nausea or vomiting.  Answering simple questions appropriately-no family at bedside.  Appears to have had a bowel movement in bed-stools are brown in color.    Objective: Vitals: Blood pressure 124/60, pulse 70, temperature 97.9 F (36.6 C), temperature source Oral, resp. rate 19, height 6\' 1"  (1.854 m), weight 90.7 kg, SpO2 97%.   Exam: Gen Exam:not in any distress HEENT:atraumatic, normocephalic Chest: B/L clear to auscultation anteriorly CVS:S1S2 regular Abdomen:soft non tender, non distended Extremities:no edema Neurology: Difficult exam but moves all 4 extremities. 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  Hypokalemia Slowly improving Recheck electrolytes in a few hours Mg stable this morning  Rhabdomyolysis Likely to be being down for the past several days at home CK downtrending Continue with IV fluids  Normocytic anemia Etiology unclear-probably some element of AKI/acute illness contributing-could have had a transient GI bleed at home (history of cirrhosis)-however had had brown stools this morning. Hb better after 2 units of PRBC transfusion On PPI/octreotide infusion Rocephin for SBP prophylaxis Await GI input  Acute metabolic encephalopathy Likely due to AKI Seems to be improving Neuroimaging negative Ammonia stable on 8/6  History of alcoholic liver cirrhosis Supportive care Unclear if he still consumes alcohol Hold diuretics  HTN BP stable Losartan/Cardizem/metoprolol/diuretics remain on hold  PAF Sinus rhythm Amiodarone Eliquis on hold due to severity anemia-concern for GI bleed Telemetry monitoring  HLD Hold statin given rhabdo  OSA CPAP nightly when  mentation better  Depression Hold Zoloft/lorazepam-until mental status improves further  Debility/deconditioning PT/OT/SLP eval May require SNF  BMI: Estimated body mass index is 26.39 kg/m as calculated from the following:   Height as of this encounter: 6\' 1"  (1.854 m).   Weight  as of this encounter: 90.7 kg.   Code status:   Code Status: Full Code   DVT Prophylaxis: SCDs Start: 05/12/23 1427    Family Communication: None at bedside   Disposition Plan: Status is: Inpatient Remains inpatient appropriate because: Severity of illness   Planned Discharge Destination:Skilled nursing facility-when medically stable-currently not ready for discharge.   Diet: Diet Order             Diet NPO time specified Except for: Sips with Meds, Ice Chips  Diet effective now                     Antimicrobial agents: Anti-infectives (From admission, onward)    None        MEDICATIONS: Scheduled Meds:  amiodarone  200 mg Oral Daily   sodium chloride flush  3 mL Intravenous Q12H   Continuous Infusions:  sodium chloride 100 mL/hr at 05/13/23 0652   octreotide (SANDOSTATIN) 500 mcg in sodium chloride 0.9 % 250 mL (2 mcg/mL) infusion 50 mcg/hr (05/13/23 7425)   pantoprazole 8 mg/hr (05/13/23 0652)   PRN Meds:.acetaminophen **OR** acetaminophen   I have personally reviewed following labs and imaging studies  LABORATORY DATA: CBC: Recent Labs  Lab 05/12/23 1257 05/13/23 0055 05/13/23 0626  WBC 7.3 7.1 7.2  NEUTROABS 5.3  --   --   HGB 5.8* 7.0* 7.3*  HCT 17.8* 20.8* 21.8*  MCV 85.2 82.5 83.8  PLT 104* 98* 94*    Basic Metabolic Panel: Recent Labs  Lab 05/12/23 1159 05/12/23 1602 05/12/23 1745 05/12/23 2157 05/13/23 0057 05/13/23 0626  NA 136  --  134* 134* 134* 134*  K 2.0*  --  2.1* <2.0* <2.0* 2.9*  CL 92*  --  97* 95* 92* 91*  CO2 22  --  22 24 24 25   GLUCOSE 100*  --  120* 124* 120* 124*  BUN 73*  --  70* 69* 68* 70*  CREATININE 9.84*  --  9.16* 9.27* 9.15* 9.28*  CALCIUM 7.4*  --  7.0* 6.9* 6.8* 6.9*  MG  --  2.2  --   --   --  2.0  PHOS  --  5.2*  --   --   --  5.9*    GFR: Estimated Creatinine Clearance: 7.9 mL/min (A) (by C-G formula based on SCr of 9.28 mg/dL (H)).  Liver Function Tests: Recent Labs  Lab  05/12/23 1159  AST 123*  ALT 38  ALKPHOS 50  BILITOT 2.1*  PROT 5.4*  ALBUMIN 2.7*   No results for input(s): "LIPASE", "AMYLASE" in the last 168 hours. Recent Labs  Lab 05/12/23 1159  AMMONIA 25    Coagulation Profile: No results for input(s): "INR", "PROTIME" in the last 168 hours.  Cardiac Enzymes: Recent Labs  Lab 05/12/23 1159 05/13/23 0057  CKTOTAL 3,606* 2,587*    BNP (last 3 results) No results for input(s): "PROBNP" in the last 8760 hours.  Lipid Profile: No results for input(s): "CHOL", "HDL", "LDLCALC", "TRIG", "CHOLHDL", "LDLDIRECT" in the last 72 hours.  Thyroid Function Tests: No results for input(s): "TSH", "T4TOTAL", "FREET4", "T3FREE", "THYROIDAB" in the last 72 hours.  Anemia Panel: No results for input(s): "VITAMINB12", "FOLATE", "FERRITIN", "TIBC", "IRON", "RETICCTPCT"  in the last 72 hours.  Urine analysis:    Component Value Date/Time   COLORURINE DARK YELLOW 03/04/2023 1022   APPEARANCEUR CLEAR 03/04/2023 1022   LABSPEC 1.012 03/04/2023 1022   PHURINE 6.0 03/04/2023 1022   GLUCOSEU NEGATIVE 03/04/2023 1022   HGBUR NEGATIVE 03/04/2023 1022   BILIRUBINUR NEGATIVE 11/07/2022 0533   KETONESUR NEGATIVE 03/04/2023 1022   PROTEINUR NEGATIVE 03/04/2023 1022   UROBILINOGEN 0.2 05/05/2014 1114   NITRITE NEGATIVE 03/04/2023 1022   LEUKOCYTESUR NEGATIVE 03/04/2023 1022    Sepsis Labs: Lactic Acid, Venous    Component Value Date/Time   LATICACIDVEN 2.4 (HH) 06/22/2021 1519    MICROBIOLOGY: No results found for this or any previous visit (from the past 240 hour(s)).  RADIOLOGY STUDIES/RESULTS: US RENAL  Result Date: 05/12/2023 CLINICAL DATA:  Acute kidney injury EXAM: RENAL / URINARY TRACT ULTRASOUND COMPLETE COMPARISON:  11/07/2022, CT 06/22/2019 FINDINGS: Right Kidney: Atrophic and not well visualized, similar to prior examination. Left Kidney: Renal measurements: 14.4 x 7.0 x 6.7 cm = volume: 353 mL. Echogenicity within normal limits. No  mass or hydronephrosis visualized. Bladder: Appears normal for degree of bladder distention. Other: The liver contour is nodular in keeping with changes of cirrhosis. Mild perihepatic ascites is present. IMPRESSION: 1. Atrophic, nonvisualized right kidney. Normal sonographic appearance of the left kidney. 2. Cirrhosis with mild perihepatic ascites. Electronically Signed   By: Helyn Numbers M.D.   On: 05/12/2023 20:38   DG Elbow Complete Left  Result Date: 05/12/2023 CLINICAL DATA:  Left elbow pain.  Fall. EXAM: LEFT ELBOW - COMPLETE 3+ VIEW COMPARISON:  None Available. FINDINGS: No definite elevation of the distal anterior humeral fat pad indicating an elbow joint effusion. The posterior fat pad also is not visualized. Moderate peripheral medial elbow degenerative spurring at the coronoid process greater than trochlea. Mild proximal radial head-radial notch of the ulna degenerative spurring. No acute fracture is seen. No dislocation. Mild soft tissue swelling posterior to the distal humerus and posterior to the olecranon. IMPRESSION: 1. No acute fracture is seen. 2. Mild soft tissue swelling posterior to the distal humerus and posterior to the olecranon. 3. Moderate medial elbow degenerative spurring. Electronically Signed   By: Neita Garnet M.D.   On: 05/12/2023 13:18   DG Hip Unilat W or Wo Pelvis 2-3 Views Right  Result Date: 05/12/2023 CLINICAL DATA:  Right hip pain.  Fall. EXAM: DG HIP (WITH OR WITHOUT PELVIS) 2-3V RIGHT COMPARISON:  CT abdomen and pelvis 09/28/2008 FINDINGS: There is severe superomedial right femoroacetabular joint space narrowing. Moderate right femoral head-neck junction circumferential degenerative osteophytes. Mild right bilateral superolateral acetabular degenerative osteophytosis. Mild left femoroacetabular joint space narrowing and inferior left femoral head-neck junction degenerative spurring. The pubic symphysis joint space is maintained. Minimal bilateral inferior sacroiliac  joint space narrowing. No acute fracture or dislocation. 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 Electronically Signed   By: Neita Garnet M.D.   On: 05/12/2023 13:16   CT Head Wo Contrast  Result Date: 05/12/2023 CLINICAL DATA:  Head trauma, minor (Age >= 65y); Neck trauma (Age >= 65y) EXAM: CT HEAD WITHOUT CONTRAST CT CERVICAL SPINE WITHOUT CONTRAST TECHNIQUE: Multidetector CT imaging of the head and cervical spine was performed following the standard protocol without intravenous contrast. Multiplanar CT image reconstructions of the cervical spine were also generated. RADIATION DOSE REDUCTION: This exam was performed according to the departmental dose-optimization program which includes automated exposure control, adjustment of the mA and/or kV according to patient  size and/or use of iterative reconstruction technique. COMPARISON:  CT Head 06/22/21, CT C Spine 06/22/21 FINDINGS: CT HEAD FINDINGS Brain: No evidence of acute infarction, hemorrhage, hydrocephalus, extra-axial collection or mass lesion/mass effect. There is sequela of mild chronic microvascular ischemic change. Vascular: No hyperdense vessel or unexpected calcification. Skull: Normal. Negative for fracture or focal lesion. Sinuses/Orbits: No middle ear or mastoid effusion. Paranasal sinuses are clear. Bilateral lens replacement. Orbits are otherwise unremarkable. Other: None. CT CERVICAL SPINE FINDINGS Alignment: Normal. Skull base and vertebrae: No acute fracture. No primary bone lesion or focal pathologic process. Soft tissues and spinal canal: No prevertebral fluid or swelling. No visible canal hematoma. Disc levels:  No evidence of high-grade spinal canal stenosis. Upper chest: Negative. Other: Known IMPRESSION: 1. No acute intracranial abnormality. 2. No acute fracture or traumatic malalignment of the cervical spine. Electronically Signed   By: Lorenza Cambridge M.D.   On: 05/12/2023 12:12   CT Cervical  Spine Wo Contrast  Result Date: 05/12/2023 CLINICAL DATA:  Head trauma, minor (Age >= 65y); Neck trauma (Age >= 65y) EXAM: CT HEAD WITHOUT CONTRAST CT CERVICAL SPINE WITHOUT CONTRAST TECHNIQUE: Multidetector CT imaging of the head and cervical spine was performed following the standard protocol without intravenous contrast. Multiplanar CT image reconstructions of the cervical spine were also generated. RADIATION DOSE REDUCTION: This exam was performed according to the departmental dose-optimization program which includes automated exposure control, adjustment of the mA and/or kV according to patient size and/or use of iterative reconstruction technique. COMPARISON:  CT Head 06/22/21, CT C Spine 06/22/21 FINDINGS: CT HEAD FINDINGS Brain: No evidence of acute infarction, hemorrhage, hydrocephalus, extra-axial collection or mass lesion/mass effect. There is sequela of mild chronic microvascular ischemic change. Vascular: No hyperdense vessel or unexpected calcification. Skull: Normal. Negative for fracture or focal lesion. Sinuses/Orbits: No middle ear or mastoid effusion. Paranasal sinuses are clear. Bilateral lens replacement. Orbits are otherwise unremarkable. Other: None. CT CERVICAL SPINE FINDINGS Alignment: Normal. Skull base and vertebrae: No acute fracture. No primary bone lesion or focal pathologic process. Soft tissues and spinal canal: No prevertebral fluid or swelling. No visible canal hematoma. Disc levels:  No evidence of high-grade spinal canal stenosis. Upper chest: Negative. Other: Known IMPRESSION: 1. No acute intracranial abnormality. 2. No acute fracture or traumatic malalignment of the cervical spine. Electronically Signed   By: Lorenza Cambridge M.D.   On: 05/12/2023 12:12     LOS: 1 day   Jeoffrey Massed, MD  [facility]    To contact the attending provider between 7A-7P or the covering provider during after hours 7P-7A, please log into the web site [email or web address] and access using  universal [employer] password for that web site. If you do not have the password, please call the hospital operator.  05/13/2023, 9:40 AM

## 2023-05-13 NOTE — Plan of Care (Signed)

## 2023-05-13 NOTE — Evaluation (Signed)
Clinical/Bedside Swallow Evaluation Patient Details  Name: Cody Hines. MRN: [id number] Date of Birth: 06-Oct-1946  Today's Date: 05/13/2023 Time: SLP Start Time (ACUTE ONLY): 1321 SLP Stop Time (ACUTE ONLY): 1341 SLP Time Calculation (min) (ACUTE ONLY): 20 min  Past Medical History:  Past Medical History:  Diagnosis Date   BPH (benign prostatic hyperplasia)    Fibromyalgia    GERD (gastroesophageal reflux disease)    Hyperlipidemia    Hypertension    Hypogonadism male    IBS (irritable bowel syndrome)    OSA (obstructive sleep apnea)    Past Surgical History:  Past Surgical History:  Procedure Laterality Date    nasal smr np3  1985   BUBBLE STUDY  11/07/2022   Procedure: BUBBLE STUDY;  Surgeon: Orpah Cobb, MD;  Location: [facility] ENDOSCOPY;  Service: Cardiovascular;;   CARDIOVERSION N/A 11/07/2022   Procedure: CARDIOVERSION;  Surgeon: Orpah Cobb, MD;  Location: [facility] ENDOSCOPY;  Service: Cardiovascular;  Laterality: N/A;   CATARACT EXTRACTION, BILATERAL Bilateral 2021   Dr. Dione Booze, L 5/27, R 3/25   COLONOSCOPY     KNEE ARTHROSCOPY Left 1999   SKIN CANCER EXCISION  2020   nose   SPINE SURGERY  2007   L5 S 1 Disk   SPINE SURGERY  09/2019   Compression fracture stabilization by Dr. Lovell Sheehan   TEE WITHOUT CARDIOVERSION N/A 11/07/2022   Procedure: TRANSESOPHAGEAL ECHOCARDIOGRAM (TEE);  Surgeon: Orpah Cobb, MD;  Location: [facility] ENDOSCOPY;  Service: Cardiovascular;  Laterality: N/A;   VASECTOMY  1983   HPI:  Cody Hines is a 77 yo male presenting to ED 8/6 with AMS. CTH negative. Pt undergoing workup with GI, who cleared him for a clear liquid diet. PMH includes HTN, HLD, GERD, CKD 3, A-fib, OSA, cirrhosis, alcohol use, fibromyalgia, chronic pain, depression, mild cognitive impairment, compression fractures    Assessment / Plan / Recommendation  Clinical Impression  Pt drowsy but easily roused and maintained alert state with Min cueing. He reports no prior swallowing  difficulty and no history of reflux of PNA. Oral motor exam WFL. SLP observed pt with trials of water and Jello in adherance with recommended clear liquid diet per GI without s/s of dysphagia or aspiration. Pt attempted to initiate self-feeding, although was somewhat disoriented and required some assistance. Recommend initiating clear liquid diet with ability to upgrade per GI discretion. Will f/u as diet advances to ensure safety with more solid diet given lethargy and AMS.  SLP Visit Diagnosis: Dysphagia, unspecified (R13.10)    Aspiration Risk  Mild aspiration risk    Diet Recommendation Other (Comment) (clear liquid diet)    Liquid Administration via: Cup;Straw;Spoon Medication Administration: Whole meds with liquid Supervision: Staff to assist with self feeding;Full supervision/cueing for compensatory strategies Compensations: Slow rate;Small sips/bites Postural Changes: Seated upright at 90 degrees;Remain upright for at least 30 minutes after po intake    Other  Recommendations Oral Care Recommendations: Oral care BID    Recommendations for follow up therapy are one component of a multi-disciplinary discharge planning process, led by the attending physician.  Recommendations may be updated based on patient status, additional functional criteria and insurance authorization.  Follow up Recommendations No SLP follow up      Assistance Recommended at Discharge    Functional Status Assessment Patient has had a recent decline in their functional status and demonstrates the ability to make significant improvements in function in a reasonable and predictable amount of time.  Frequency and Duration min 2x/week  1  week       Prognosis Prognosis for improved oropharyngeal function: Good Barriers to Reach Goals: Cognitive deficits      Swallow Study   General HPI: Cody Hines is a 77 yo male presenting to ED 8/6 with AMS. CTH negative. Pt undergoing workup with GI, who cleared him  for a clear liquid diet. PMH includes HTN, HLD, GERD, CKD 3, A-fib, OSA, cirrhosis, alcohol use, fibromyalgia, chronic pain, depression, mild cognitive impairment, compression fractures Type of Study: Bedside Swallow Evaluation Previous Swallow Assessment: none in chart Diet Prior to this Study: NPO Temperature Spikes Noted: No Respiratory Status: Nasal cannula History of Recent Intubation: No Behavior/Cognition: Alert;Cooperative;Pleasant mood Oral Cavity Assessment: Within Functional Limits Oral Care Completed by SLP: No Oral Cavity - Dentition: Adequate natural dentition Vision: Functional for self-feeding Self-Feeding Abilities: Needs assist Patient Positioning: Upright in bed Baseline Vocal Quality: Normal Volitional Cough: Strong Volitional Swallow: Able to elicit    Oral/Motor/Sensory Function Overall Oral Motor/Sensory Function: Within functional limits   Ice Chips Ice chips: Not tested   Thin Liquid Thin Liquid: Within functional limits Presentation: Straw;Spoon    Nectar Thick Nectar Thick Liquid: Not tested   Honey Thick Honey Thick Liquid: Not tested   Puree Puree: Not tested   Solid     Solid: Not tested     Gwynneth Aliment, M.A., CF-SLP Speech Language Pathology, Acute Rehabilitation Services  Secure Chat preferred [phone]  05/13/2023,2:12 PM

## 2023-05-13 NOTE — Progress Notes (Signed)
Pt REFUSED CPAP

## 2023-05-13 NOTE — Progress Notes (Addendum)
Night cross-coverage  Patient is on sodium bicarb drip at 200 mL/h. Repeat labs showing improvement of CK but creatinine remains elevated at 9.1 and potassium remaining severely low <2.0.  Magnesium level normal.  Discussed with nephrology, bicarb drip has been stopped and switched to normal saline at 100 mL/hr.  IV potassium 10 mEq x 4 and oral potassium 80 mEq ordered.  Continue to monitor BMP.    Also, hemoglobin improved to 7.0 on repeat labs after 2 units PRBCs.  No signs of overt GI bleed and patient is hemodynamically stable.  Continue to H&H and transfuse PRBCs if hemoglobin less than 7.

## 2023-05-14 ENCOUNTER — Inpatient Hospital Stay (HOSPITAL_COMMUNITY): Payer: Medicare Other

## 2023-05-14 DIAGNOSIS — K652 Spontaneous bacterial peritonitis: Secondary | ICD-10-CM

## 2023-05-14 DIAGNOSIS — D649 Anemia, unspecified: Secondary | ICD-10-CM | POA: Diagnosis not present

## 2023-05-14 DIAGNOSIS — K7030 Alcoholic cirrhosis of liver without ascites: Secondary | ICD-10-CM | POA: Diagnosis not present

## 2023-05-14 DIAGNOSIS — I480 Paroxysmal atrial fibrillation: Secondary | ICD-10-CM | POA: Diagnosis not present

## 2023-05-14 DIAGNOSIS — R710 Precipitous drop in hematocrit: Secondary | ICD-10-CM

## 2023-05-14 DIAGNOSIS — K7460 Unspecified cirrhosis of liver: Secondary | ICD-10-CM | POA: Diagnosis not present

## 2023-05-14 DIAGNOSIS — K766 Portal hypertension: Secondary | ICD-10-CM | POA: Diagnosis not present

## 2023-05-14 DIAGNOSIS — G9340 Encephalopathy, unspecified: Secondary | ICD-10-CM | POA: Diagnosis not present

## 2023-05-14 DIAGNOSIS — N179 Acute kidney failure, unspecified: Secondary | ICD-10-CM | POA: Diagnosis not present

## 2023-05-14 HISTORY — PX: IR PARACENTESIS: IMG2679

## 2023-05-14 LAB — BODY FLUID CELL COUNT WITH DIFFERENTIAL
Eos, Fluid: 0 %
Lymphs, Fluid: 12 %
Monocyte-Macrophage-Serous Fluid: 9 % — ABNORMAL LOW (ref 50–90)
Neutrophil Count, Fluid: 79 % — ABNORMAL HIGH (ref 0–25)
Total Nucleated Cell Count, Fluid: 1339 cu mm — ABNORMAL HIGH (ref 0–1000)

## 2023-05-14 LAB — GRAM STAIN

## 2023-05-14 LAB — BASIC METABOLIC PANEL
Anion gap: 13 (ref 5–15)
Anion gap: 14 (ref 5–15)
BUN: 72 mg/dL — ABNORMAL HIGH (ref 8–23)
BUN: 71 mg/dL — ABNORMAL HIGH (ref 8–23)
CO2: 22 mmol/L (ref 22–32)
CO2: 21 mmol/L — ABNORMAL LOW (ref 22–32)
Calcium: 7 mg/dL — ABNORMAL LOW (ref 8.9–10.3)
Calcium: 7.2 mg/dL — ABNORMAL LOW (ref 8.9–10.3)
Chloride: 99 mmol/L (ref 98–111)
Chloride: 100 mmol/L (ref 98–111)
Creatinine, Ser: 9.85 mg/dL — ABNORMAL HIGH (ref 0.61–1.24)
Creatinine, Ser: 9.99 mg/dL — ABNORMAL HIGH (ref 0.61–1.24)
GFR, Estimated: 5 mL/min — ABNORMAL LOW (ref >60)
GFR, Estimated: 5 mL/min — ABNORMAL LOW (ref >60)
Glucose, Bld: 175 mg/dL — ABNORMAL HIGH (ref 70–99)
Glucose, Bld: 132 mg/dL — ABNORMAL HIGH (ref 70–99)
Potassium: 2.8 mmol/L — ABNORMAL LOW (ref 3.5–5.1)
Potassium: 2.9 mmol/L — ABNORMAL LOW (ref 3.5–5.1)
Sodium: 134 mmol/L — ABNORMAL LOW (ref 135–145)
Sodium: 135 mmol/L (ref 135–145)

## 2023-05-14 LAB — ALBUMIN, PLEURAL OR PERITONEAL FLUID: Albumin, Fluid: <1.5 g/dL

## 2023-05-14 MED ORDER — MIDODRINE HCL 5 MG PO TABS
10.0000 mg | ORAL_TABLET | Freq: Two times a day (BID) | ORAL | Status: DC
  Administered 2023-05-14 – 2023-05-25 (×21): 10 mg via ORAL
  Filled 2023-05-14 (×22): qty 2

## 2023-05-14 MED ORDER — ALBUMIN HUMAN 25 % IV SOLN
25.0000 g | Freq: Every day | INTRAVENOUS | Status: DC
  Administered 2023-05-14: 25 g via INTRAVENOUS
  Filled 2023-05-14: qty 100

## 2023-05-14 MED ORDER — POTASSIUM CHLORIDE CRYS ER 20 MEQ PO TBCR
40.0000 meq | EXTENDED_RELEASE_TABLET | ORAL | Status: AC
  Administered 2023-05-14 – 2023-05-15 (×2): 40 meq via ORAL
  Filled 2023-05-14 (×2): qty 2

## 2023-05-14 MED ORDER — POTASSIUM CHLORIDE CRYS ER 20 MEQ PO TBCR
40.0000 meq | EXTENDED_RELEASE_TABLET | ORAL | Status: AC
  Administered 2023-05-14 (×2): 40 meq via ORAL
  Filled 2023-05-14 (×2): qty 2

## 2023-05-14 MED ORDER — OCTREOTIDE ACETATE 100 MCG/ML IJ SOLN
200.0000 ug | Freq: Three times a day (TID) | INTRAMUSCULAR | Status: DC
  Administered 2023-05-14 – 2023-05-21 (×20): 200 ug via SUBCUTANEOUS
  Filled 2023-05-14 (×23): qty 2

## 2023-05-14 MED ORDER — POTASSIUM CHLORIDE 10 MEQ/100ML IV SOLN
10.0000 meq | INTRAVENOUS | Status: AC
  Administered 2023-05-14 (×2): 10 meq via INTRAVENOUS
  Filled 2023-05-14 (×2): qty 100

## 2023-05-14 MED ORDER — LIDOCAINE HCL 1 % IJ SOLN
INTRAMUSCULAR | Status: AC
  Filled 2023-05-14: qty 20

## 2023-05-14 MED ORDER — SODIUM CHLORIDE 0.9 % IV SOLN
2.0000 g | INTRAVENOUS | Status: DC
  Administered 2023-05-15 – 2023-05-25 (×11): 2 g via INTRAVENOUS
  Filled 2023-05-14 (×12): qty 20

## 2023-05-14 MED ORDER — LIDOCAINE HCL 1 % IJ SOLN
20.0000 mL | Freq: Once | INTRAMUSCULAR | Status: AC
  Administered 2023-05-14: 10 mL via INTRADERMAL
  Filled 2023-05-14: qty 20

## 2023-05-14 MED ORDER — ALBUMIN HUMAN 25 % IV SOLN
25.0000 g | Freq: Four times a day (QID) | INTRAVENOUS | Status: AC
  Administered 2023-05-14 – 2023-05-16 (×6): 25 g via INTRAVENOUS
  Filled 2023-05-14 (×6): qty 100

## 2023-05-14 MED ORDER — POTASSIUM CHLORIDE CRYS ER 20 MEQ PO TBCR
40.0000 meq | EXTENDED_RELEASE_TABLET | Freq: Once | ORAL | Status: AC
  Administered 2023-05-14: 40 meq via ORAL
  Filled 2023-05-14: qty 2

## 2023-05-14 MED ORDER — PANTOPRAZOLE SODIUM 40 MG PO TBEC
40.0000 mg | DELAYED_RELEASE_TABLET | Freq: Two times a day (BID) | ORAL | Status: DC
  Administered 2023-05-14 – 2023-05-25 (×23): 40 mg via ORAL
  Filled 2023-05-14 (×23): qty 1

## 2023-05-14 MED ORDER — POTASSIUM CHLORIDE 10 MEQ/100ML IV SOLN
10.0000 meq | INTRAVENOUS | Status: AC
  Administered 2023-05-14 (×4): 10 meq via INTRAVENOUS
  Filled 2023-05-14 (×4): qty 100

## 2023-05-14 NOTE — Plan of Care (Signed)
  Problem: Clinical Measurements: Goal: Will remain free from infection Outcome: Progressing Goal: Respiratory complications will improve Outcome: Progressing   Problem: Elimination: Goal: Will not experience complications related to bowel motility Outcome: Progressing   Problem: Pain Managment: Goal: General experience of comfort will improve Outcome: Progressing   Problem: Fluid Volume: Goal: Will show no signs and symptoms of excessive bleeding Outcome: Progressing

## 2023-05-14 NOTE — Procedures (Signed)
PROCEDURE SUMMARY:  Successful image-guided paracentesis from the righ lower abdomen.  Yielded 100 mL of clear yellow fluid.  No immediate complications.  EBL = trace. Patient tolerated well.   Specimen was sent for labs.  Please see imaging section of Epic for full dictation.   Kennieth Francois PA-C 05/14/2023 11:53 AM

## 2023-05-14 NOTE — Progress Notes (Signed)
[facility] night cross cover note:   I was notified by RN that this morning's labs reflect serum potassium level 2.5, down from previous value of 3.0, will noting that he received.  Total of 80 mill equivalents of oral potassium chloride as well as 40 mill equivalents of IV potassium on 05/13/2023.  Serum magnesium level noted to be 2.1 this morning.  Per brief chart review, nephrology recommends aggressive repletion of serum potassium level.   Subsequently, I have ordered potassium chloride 40 meq p.o. x 1 dose now as well as potassium chloride 40 meq IV over 4 hours.  Additionally, I have ordered a repeat BMP to be checked around 8 AM this morning.     Newton Pigg, DO Hospitalist

## 2023-05-14 NOTE — Evaluation (Signed)
Occupational Therapy Evaluation Patient Details Name: Cody Hines. MRN: [id number] DOB: [birth year] Today's Date: 05/14/2023   History of Present Illness 77 yo male presenting to ED 8/6 with AMS. Found by ex-wife confused in his bed, incontinent of stool. CTH negative. Rt hip and Lt elbow xrays negative. +AKI, dehydration, hypotension, hypokalemia, Hgb 5.8 (transfused 2 units)  PMH  significant of HTN, OSA on CPAP, MCI, multiple spinal compression fxs, CKD, afib, cirrhosis, fibromyalgia, mild cognitive impairment   Clinical Impression   At baseline, pt is Independent to Mod I with ADLs and light meal prep and drives. At baseline pt uses a cane or RW for functional mobility and reports approx. 6 falls in past 6 months. Pt reports he receives assistance for some regular assistance with meal prep, home management tasks, and errands. Pt now presents with decreased activity tolerance, impaired cognition (per chart pt with mild cognitive deficits at baseline, uncertain if pt is at baseline), decreased B UE strength and coordination, and decreased safety and independence with ADLs and bed mobility. OT unable to assess functional transfers, mobility, and balance this day secondary to pt presenting with lethargy, decreased activity tolerance, and episode of bowel incontinence requiring +2 assist for peri-care, bathing, and linen change this session. Pt was unaware he had had episode of bowel incontinence. Pt currently demonstrates ability to complete UB ADLs Set up to Max assist from bed level and LB ADLs with Total assist +2 from bed level. Pt will benefit from acute skilled OT services to address deficits outlined below, decrease caregiver burden, and increase safety and independence with ADLs, bed mobility during/in preparation for functional tasks, and functional transfers/mobility. Post acute discharge, OT currently recommends intensive inpatient skilled rehab services < 3 hours per day to  maximize rehab potential. Recommendation will be updated as needed once acute OT is able to fully assess pt transfers, mobility, and balance.       If plan is discharge home, recommend the following: Two people to help with walking and/or transfers;Two people to help with bathing/dressing/bathroom;Assistance with cooking/housework;Assistance with feeding;Direct supervision/assist for medications management;Direct supervision/assist for financial management;Assist for transportation;Help with stairs or ramp for entrance;Supervision due to cognitive status (Set up for self feeding)    Functional Status Assessment  Patient has had a recent decline in their functional status and demonstrates the ability to make significant improvements in function in a reasonable and predictable amount of time.  Equipment Recommendations  Other (comment) (defer to next level of care)    Recommendations for Other Services       Precautions / Restrictions Precautions Precautions: Fall Precaution Comments: pt reports he doesn't remember falling, but he was told he did Restrictions Weight Bearing Restrictions: No      Mobility Bed Mobility Overal bed mobility: Needs Assistance Bed Mobility: Rolling Rolling: Min assist, Used rails         General bed mobility comments: Pt able to roll with Min assist but requiring Mod assist to hold position during pericare/bathing/linen change requiring +2 assist from bed level this session.    Transfers                   General transfer comment: deferred this session for pt/therapist safety; OT to further assess during future skilled OT sessions      Balance Overall balance assessment:  (OT to further assess in furture skilled OT sessions)  ADL either performed or assessed with clinical judgement   ADL Overall ADL's : Needs assistance/impaired Eating/Feeding: Set up;Bed level   Grooming:  Contact guard assist;Cueing for sequencing;Bed level   Upper Body Bathing: Maximal assistance;Cueing for sequencing;Cueing for compensatory techniques;Bed level   Lower Body Bathing: Total assistance;+2 for physical assistance;Bed level   Upper Body Dressing : Moderate assistance;Cueing for sequencing;Bed level   Lower Body Dressing: Total assistance;+2 for physical assistance;Bed level       Toileting- Clothing Manipulation and Hygiene: Total assistance;+2 for physical assistance;Bed level         General ADL Comments: Functional transfers/mobility deferred this session for pt/therapist safety secondary to pt presenting with lethergy, pain, decreased activity tolernace, and requiring +2 assist for peri-care/bathing/linen change from bed level after episode of bowel incontinece this session. Pt reported he can tell when he needs to urinate or have a bowel movement but pt was unaware he had had an episode of bowel incontience and had signifcantly wet and soiled bed linens.     Vision Baseline Vision/History: 4 Cataracts;0 No visual deficits (Hx of cataracts with subsequent surgery) Ability to See in Adequate Light: 0 Adequate Patient Visual Report: No change from baseline       Perception         Praxis         Pertinent Vitals/Pain Pain Assessment Pain Assessment: Faces Faces Pain Scale: Hurts even more Pain Location: back; buttocks and penis during peri-care and LB bathing Pain Descriptors / Indicators: Aching, Discomfort, Grimacing, Guarding Pain Intervention(s): Limited activity within patient's tolerance, Monitored during session, Repositioned     Extremity/Trunk Assessment Upper Extremity Assessment Upper Extremity Assessment: Right hand dominant;Generalized weakness;RUE deficits/detail;LUE deficits/detail RUE Deficits / Details: generalized weakness, mildly edematous hand, impaired shoulder ROM at baseline secondary to prior injury; AROM shoulder flexion to approx.  100 degrees, decreased sensation, impaired fine and gross motor coordination RUE Sensation: decreased proprioception RUE Coordination: decreased fine motor;decreased gross motor LUE Deficits / Details: generalized weakness, mildly edematous hand, impaired shoulder ROM at baseline secondary to prior injury; AROM shoulder flexion to approx. 70 degrees, AAROM to approx 90 degrees, decreased sensation, impaired fine and gross motor coordination LUE Sensation: decreased proprioception LUE Coordination: decreased fine motor;decreased gross motor   Lower Extremity Assessment Lower Extremity Assessment: Defer to PT evaluation   Cervical / Trunk Assessment Cervical / Trunk Assessment: Other exceptions Cervical / Trunk Exceptions: h/o compression fractures with persistent pain   Communication Communication Communication: Difficulty communicating thoughts/reduced clarity of speech;Other (comment) (Appears to have mild hearing impairment) Cueing Techniques: Verbal cues;Tactile cues;Gestural cues   Cognition Arousal: Lethargic Behavior During Therapy: Flat affect Overall Cognitive Status: No family/caregiver present to determine baseline cognitive functioning (Per chart review, pt with hisotry of mild cognitive impairments. OT uncertain if pt is currently at his baseline or if he is presenting with a decline in cognition at this time.)                                 General Comments: Pt AAOx4 and pleasant throughout session. Pt presents with poor insight into deficits, impaired short term memory, impaired safety awareness, Sustained attention, Emergent awareness, inconsistent ability to follow 1 step commands with cues, need for increased time to follow 1 step commands, slow processing, and impaired ability to sequence tasks.     General Comments  Pt with noted episode of bowel incontinence upon OT arrival with pt  unaware. Pt noted to have reddened and raw areas on buttocks and penis  during peri-care/bathing in bed with RN present and also observing areas of concern. VSS on 2L continuous O2 through nasal cannula throughout session.    Exercises     Shoulder Instructions      Home Living Family/patient expects to be discharged to:: Private residence Living Arrangements: Alone Available Help at Discharge: Family;Available PRN/intermittently;Other (Comment) (Pt has weekly paid assistance for 3 hours laundry, cleaning, yard work, and errands) Type of Home: House Home Access: Ramped entrance     Home Layout: Multi-level Alternate Level Stairs-Number of Steps: 7 (from main level up to bedroom; more steps down to lower level where laundry is--he does not go down there) Alternate Level Stairs-Rails: Left;Right Bathroom Shower/Tub: Producer, television/film/video: Standard     Home Equipment: Cane - single Information systems manager (2 wheels);Shower seat - built in;Hand Dispensing optician (4 wheels);Adaptive equipment Adaptive Equipment: Long-handled shoe horn        Prior Functioning/Environment Prior Level of Function : Independent/Modified Independent;Driving             Mobility Comments: uses cane inside (sometimes RW), uses RW outside, drives, reports approx. 6 falls in past 6 months. Pt reports he has a 100lbs dog that sometimes gets under his feet. ADLs Comments: Independent to Mod I with ADLs and light meal prep        OT Problem List: Decreased strength;Decreased activity tolerance;Decreased coordination;Decreased cognition;Decreased safety awareness;Decreased knowledge of use of DME or AE      OT Treatment/Interventions: Self-care/ADL training;Therapeutic exercise;DME and/or AE instruction;Therapeutic activities;Cognitive remediation/compensation;Patient/family education;Balance training    OT Goals(Current goals can be found in the care plan section) Acute Rehab OT Goals Patient Stated Goal: Pt reports he would like to return  home OT Goal Formulation: With patient Time For Goal Achievement: 05/28/23 Potential to Achieve Goals: Good ADL Goals Pt Will Perform Grooming: with supervision;sitting (sitting EOB for 5 minutes or more with Good balance) Pt Will Perform Upper Body Bathing: with contact guard assist;sitting Pt Will Perform Upper Body Dressing: sitting;with set-up Pt Will Transfer to Toilet: with supervision;ambulating;bedside commode (with least restrictive AD) Pt Will Perform Toileting - Clothing Manipulation and hygiene: with min assist;sitting/lateral leans;sit to/from stand Additional ADL Goal #1: Pt will demonstrate ability to appropriately sequence a 3-step functional or therapeutic task with Mod I while demonstrating Alertnating attention and Anticipatory awareness.  OT Frequency: Min 1X/week    Co-evaluation              AM-PAC OT "6 Clicks" Daily Activity     Outcome Measure Help from another person eating meals?: A Little Help from another person taking care of personal grooming?: A Little Help from another person toileting, which includes using toliet, bedpan, or urinal?: Total Help from another person bathing (including washing, rinsing, drying)?: A Lot Help from another person to put on and taking off regular upper body clothing?: A Little Help from another person to put on and taking off regular lower body clothing?: Total 6 Click Score: 13   End of Session Equipment Utilized During Treatment: Oxygen Nurse Communication: Mobility status;Other (comment) (Reddened and raw areas noted on buttocks and penis during peri-care/bathing. Pt with episode of bowel incontinence with pt unaware of episode.)  Activity Tolerance: Patient limited by lethargy;Patient limited by pain;Other (comment) (Patient limited by impaired cognition) Patient left: in bed;with call bell/phone within reach;with bed alarm set  OT Visit Diagnosis: Muscle weakness (generalized) (  M62.81);Ataxia, unspecified  (R27.0);Other symptoms and signs involving cognitive function                Time: 9323-5573 OT Time Calculation (min): 62 min Charges:  OT General Charges $OT Visit: 1 Visit OT Evaluation $OT Eval Moderate Complexity: 1 Mod OT Treatments $Self Care/Home Management : 38-52 mins   "Orson Eva., OTR/L, MA Acute Rehab [phone]   Lendon Colonel 05/14/2023, 6:35 PM

## 2023-05-14 NOTE — Progress Notes (Addendum)
PROGRESS NOTE        PATIENT DETAILS Name: Cody Hines. Age: 77 y.o. Sex: male Date of Birth: 27-May-1946 Admit Date: 05/12/2023 Admitting Physician Synetta Fail, MD ZOX:[id number], [name], MD  Brief Summary: Patient is a 77 y.o.  male with history of HTN, HLD, PAF on Eliquis, cirrhosis, EtOH use, depression, mild cognitive impairment who was found by significant other confused (had not heard from him for 10 days-separated from wife).  He was found to have severe anemia, AKI, rhabdomyolysis, encephalopathy and subsequently admitted to the hospitalist service.  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  Significant microbiology data: None  Procedures: None  Consults: Nephrology GI  Subjective: Awake alert-no complaints.  No diarrhea.  No abdominal pain.  Objective: Vitals: Blood pressure 116/60, pulse 67, temperature 97.8 F (36.6 C), temperature source Oral, resp. rate 16, height 6\' 1"  (1.854 m), weight 90.7 kg, SpO2 95%.   Exam: Gen Exam:Alert awake-not in any distress HEENT:atraumatic, normocephalic Chest: B/L clear to auscultation anteriorly CVS:S1S2 regular Abdomen:soft non tender, non distended Extremities:no edema Neurology: Non focal Skin: no rash  Pertinent Labs/Radiology:    Latest Ref Rng & Units 05/14/2023   12:01 AM 05/13/2023    6:26 AM 05/13/2023   12:55 AM  CBC  WBC 4.0 - 10.5 K/uL 7.6  7.2  7.1   Hemoglobin 13.0 - 17.0 g/dL 7.7  7.3  7.0   Hematocrit 39.0 - 52.0 % 23.1  21.8  20.8   Platelets 150 - 400 K/uL 93  94  98     Lab Results  Component Value Date   NA 135 05/14/2023   K 2.5 (LL) 05/14/2023   CL 97  (L) 05/14/2023   CO2 23 05/14/2023     Assessment/Plan: 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  Rhabdomyolysis Likely to be being down for the past several days at home CK downtrending Continue with IV fluids  Normocytic anemia Etiology unclear-probably some element of AKI/acute illness contributing-could have had a transient GI bleed at home (history of cirrhosis)-however had had brown stools on 8/8-do not think any active ongoing bleeding at this point Hb better after 2 units of PRBC transfusion PPI Rocephin for SBP prophylaxis GI following with potential endoscopic evaluation at some point during this hospitalization.  Acute metabolic encephalopathy Likely due to AKI Seems to have improved significantly. Neuroimaging negative Ammonia stable on 8/6  History of alcoholic liver cirrhosis Supportive care Unclear if he still consumes alcohol Diuretics on hold Continue lactulose Rocephin for SBP prophylaxis Addendum: Ascitic fluid concerning for SBP-Rocephin changed to therapeutic dosing.  HTN BP stable Losartan/Cardizem/metoprolol/diuretics remain on hold  PAF Sinus rhythm Amiodarone Eliquis on hold due to severity anemia-concern for GI bleed Telemetry monitoring  HLD Hold statin given rhabdo  OSA CPAP nightly when mentation better  Depression Hold Zoloft/lorazepam-until mental status improves further  Debility/deconditioning PT/OT/SLP eval May require  SNF  BMI: Estimated body mass index is 26.39 kg/m as calculated from the following:   Height as of this encounter: 6\' 1"  (1.854 m).   Weight as of this encounter: 90.7 kg.   Code status:   Code Status: Full Code   DVT Prophylaxis: SCDs Start: 05/12/23 1427    Family Communication: Son-[phone]-updated 8/8   Disposition Plan: Status is: Inpatient Remains inpatient appropriate because: Severity of illness   Planned Discharge Destination:Skilled nursing facility-when medically stable-currently not ready for discharge.   Diet: Diet Order             Diet clear liquid Room service appropriate? Yes with Assist; Fluid consistency: Thin  Diet effective now                     Antimicrobial agents: Anti-infectives (From admission, onward)    Start     Dose/Rate Route Frequency Ordered Stop   05/13/23 1100  cefTRIAXone (ROCEPHIN) 1 g in sodium chloride 0.9 % 100 mL IVPB        1 g 200 mL/hr over 30 Minutes Intravenous Every 24 hours 05/13/23 0949          MEDICATIONS: Scheduled Meds:  amiodarone  200 mg Oral Daily   lactulose  10 g Oral Daily   sodium chloride flush  3 mL Intravenous Q12H   Continuous Infusions:  cefTRIAXone (ROCEPHIN)  IV 1 g (05/14/23 0942)   pantoprazole 8 mg/hr (05/13/23 2345)   PRN Meds:.acetaminophen **OR** acetaminophen   I have personally reviewed following labs and imaging studies  LABORATORY DATA: CBC: Recent Labs  Lab 05/12/23 1257 05/13/23 0055 05/13/23 0626 05/14/23 0001  WBC 7.3 7.1 7.2 7.6  NEUTROABS 5.3  --   --   --   HGB 5.8* 7.0* 7.3* 7.7*  HCT 17.8* 20.8* 21.8* 23.1*  MCV 85.2 82.5 83.8 84.9  PLT 104* 98* 94* 93*    Basic Metabolic Panel: Recent Labs  Lab 05/12/23 1602 05/12/23 1745 05/13/23 0057 05/13/23 0626 05/13/23 0957 05/13/23 1546 05/14/23 0001  NA  --    < > 134* 134* 134* 135 135  K  --    < > <2.0* 2.9* 2.6* 3.0* 2.5*  CL  --    < > 92* 91* 96* 96* 97*  CO2  --    < > 24 25  21* 23 23  GLUCOSE  --    < > 120* 124* 139* 150* 143*  BUN  --    < > 68* 70* 67* 71* 70*  CREATININE  --    < > 9.15* 9.28* 9.48* 9.46* 9.57*  CALCIUM  --    < > 6.8* 6.9* 6.8* 6.7* 7.1*  MG 2.2  --   --  2.0  --   --  2.1  PHOS 5.2*  --   --  5.9*  --   --  5.5*   < > = values in this interval not displayed.    GFR: Estimated Creatinine Clearance: 7.7 mL/min (A) (by C-G formula based on SCr of 9.57 mg/dL (H)).  Liver Function Tests: Recent Labs  Lab 05/12/23 1159 05/14/23 0001  AST 123* 76*  ALT 38 38  ALKPHOS 50 52  BILITOT 2.1* 1.9*  PROT 5.4* 5.6*  ALBUMIN 2.7* 2.7*   No results for input(s): "LIPASE", "  AMYLASE" in the last 168 hours. Recent Labs  Lab 05/12/23 1159  AMMONIA 25    Coagulation Profile: Recent Labs  Lab 05/13/23 1002  INR 1.6*    Cardiac Enzymes: Recent Labs  Lab 05/12/23 1159 05/13/23 0057 05/14/23 0001  CKTOTAL 3,606* 2,587* 1,504*    BNP (last 3 results) No results for input(s): "PROBNP" in the last 8760 hours.  Lipid Profile: No results for input(s): "CHOL", "HDL", "LDLCALC", "TRIG", "CHOLHDL", "LDLDIRECT" in the last 72 hours.  Thyroid Function Tests: No results for input(s): "TSH", "T4TOTAL", "FREET4", "T3FREE", "THYROIDAB" in the last 72 hours.  Anemia Panel: No results for input(s): "VITAMINB12", "FOLATE", "FERRITIN", "TIBC", "IRON", "RETICCTPCT" in the last 72 hours.  Urine analysis:    Component Value Date/Time   COLORURINE YELLOW 05/13/2023 1522   APPEARANCEUR HAZY (A) 05/13/2023 1522   LABSPEC 1.011 05/13/2023 1522   PHURINE 5.0 05/13/2023 1522   GLUCOSEU NEGATIVE 05/13/2023 1522   HGBUR LARGE (A) 05/13/2023 1522   BILIRUBINUR NEGATIVE 05/13/2023 1522   KETONESUR NEGATIVE 05/13/2023 1522   PROTEINUR 30 (A) 05/13/2023 1522   UROBILINOGEN 0.2 05/05/2014 1114   NITRITE NEGATIVE 05/13/2023 1522   LEUKOCYTESUR NEGATIVE 05/13/2023 1522    Sepsis Labs: Lactic Acid, Venous    Component Value Date/Time    LATICACIDVEN 2.4 (HH) 06/22/2021 1519    MICROBIOLOGY: No results found for this or any previous visit (from the past 240 hour(s)).  RADIOLOGY STUDIES/RESULTS: US RENAL  Result Date: 05/12/2023 CLINICAL DATA:  Acute kidney injury EXAM: RENAL / URINARY TRACT ULTRASOUND COMPLETE COMPARISON:  11/07/2022, CT 06/22/2019 FINDINGS: Right Kidney: Atrophic and not well visualized, similar to prior examination. Left Kidney: Renal measurements: 14.4 x 7.0 x 6.7 cm = volume: 353 mL. Echogenicity within normal limits. No mass or hydronephrosis visualized. Bladder: Appears normal for degree of bladder distention. Other: The liver contour is nodular in keeping with changes of cirrhosis. Mild perihepatic ascites is present. IMPRESSION: 1. Atrophic, nonvisualized right kidney. Normal sonographic appearance of the left kidney. 2. Cirrhosis with mild perihepatic ascites. Electronically Signed   By: Helyn Numbers M.D.   On: 05/12/2023 20:38   DG Elbow Complete Left  Result Date: 05/12/2023 CLINICAL DATA:  Left elbow pain.  Fall. EXAM: LEFT ELBOW - COMPLETE 3+ VIEW COMPARISON:  None Available. FINDINGS: No definite elevation of the distal anterior humeral fat pad indicating an elbow joint effusion. The posterior fat pad also is not visualized. Moderate peripheral medial elbow degenerative spurring at the coronoid process greater than trochlea. Mild proximal radial head-radial notch of the ulna degenerative spurring. No acute fracture is seen. No dislocation. Mild soft tissue swelling posterior to the distal humerus and posterior to the olecranon. IMPRESSION: 1. No acute fracture is seen. 2. Mild soft tissue swelling posterior to the distal humerus and posterior to the olecranon. 3. Moderate medial elbow degenerative spurring. Electronically Signed   By: Neita Garnet M.D.   On: 05/12/2023 13:18   DG Hip Unilat W or Wo Pelvis 2-3 Views Right  Result Date: 05/12/2023 CLINICAL DATA:  Right hip pain.  Fall. EXAM: DG HIP (WITH  OR WITHOUT PELVIS) 2-3V RIGHT COMPARISON:  CT abdomen and pelvis 09/28/2008 FINDINGS: There is severe superomedial right femoroacetabular joint space narrowing. Moderate right femoral head-neck junction circumferential degenerative osteophytes. Mild right bilateral superolateral acetabular degenerative osteophytosis. Mild left femoroacetabular joint space narrowing and inferior left femoral head-neck junction degenerative spurring. The pubic symphysis joint space is maintained. Minimal bilateral inferior sacroiliac joint space narrowing. No acute fracture  or dislocation. 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 Electronically Signed   By: Neita Garnet M.D.   On: 05/12/2023 13:16   CT Head Wo Contrast  Result Date: 05/12/2023 CLINICAL DATA:  Head trauma, minor (Age >= 65y); Neck trauma (Age >= 65y) EXAM: CT HEAD WITHOUT CONTRAST CT CERVICAL SPINE WITHOUT CONTRAST TECHNIQUE: Multidetector CT imaging of the head and cervical spine was performed following the standard protocol without intravenous contrast. Multiplanar CT image reconstructions of the cervical spine were also generated. RADIATION DOSE REDUCTION: This exam was performed according to the departmental dose-optimization program which includes automated exposure control, adjustment of the mA and/or kV according to patient size and/or use of iterative reconstruction technique. COMPARISON:  CT Head 06/22/21, CT C Spine 06/22/21 FINDINGS: CT HEAD FINDINGS Brain: No evidence of acute infarction, hemorrhage, hydrocephalus, extra-axial collection or mass lesion/mass effect. There is sequela of mild chronic microvascular ischemic change. Vascular: No hyperdense vessel or unexpected calcification. Skull: Normal. Negative for fracture or focal lesion. Sinuses/Orbits: No middle ear or mastoid effusion. Paranasal sinuses are clear. Bilateral lens replacement. Orbits are otherwise unremarkable. Other: None. CT CERVICAL SPINE  FINDINGS Alignment: Normal. Skull base and vertebrae: No acute fracture. No primary bone lesion or focal pathologic process. Soft tissues and spinal canal: No prevertebral fluid or swelling. No visible canal hematoma. Disc levels:  No evidence of high-grade spinal canal stenosis. Upper chest: Negative. Other: Known IMPRESSION: 1. No acute intracranial abnormality. 2. No acute fracture or traumatic malalignment of the cervical spine. Electronically Signed   By: Lorenza Cambridge M.D.   On: 05/12/2023 12:12   CT Cervical Spine Wo Contrast  Result Date: 05/12/2023 CLINICAL DATA:  Head trauma, minor (Age >= 65y); Neck trauma (Age >= 65y) EXAM: CT HEAD WITHOUT CONTRAST CT CERVICAL SPINE WITHOUT CONTRAST TECHNIQUE: Multidetector CT imaging of the head and cervical spine was performed following the standard protocol without intravenous contrast. Multiplanar CT image reconstructions of the cervical spine were also generated. RADIATION DOSE REDUCTION: This exam was performed according to the departmental dose-optimization program which includes automated exposure control, adjustment of the mA and/or kV according to patient size and/or use of iterative reconstruction technique. COMPARISON:  CT Head 06/22/21, CT C Spine 06/22/21 FINDINGS: CT HEAD FINDINGS Brain: No evidence of acute infarction, hemorrhage, hydrocephalus, extra-axial collection or mass lesion/mass effect. There is sequela of mild chronic microvascular ischemic change. Vascular: No hyperdense vessel or unexpected calcification. Skull: Normal. Negative for fracture or focal lesion. Sinuses/Orbits: No middle ear or mastoid effusion. Paranasal sinuses are clear. Bilateral lens replacement. Orbits are otherwise unremarkable. Other: None. CT CERVICAL SPINE FINDINGS Alignment: Normal. Skull base and vertebrae: No acute fracture. No primary bone lesion or focal pathologic process. Soft tissues and spinal canal: No prevertebral fluid or swelling. No visible canal hematoma.  Disc levels:  No evidence of high-grade spinal canal stenosis. Upper chest: Negative. Other: Known IMPRESSION: 1. No acute intracranial abnormality. 2. No acute fracture or traumatic malalignment of the cervical spine. Electronically Signed   By: Lorenza Cambridge M.D.   On: 05/12/2023 12:12     LOS: 2 days   Jeoffrey Massed, MD  [facility]    To contact the attending provider between 7A-7P or the covering provider during after hours 7P-7A, please log into the web site [email or web address] and access using universal [place] password for that web site. If you do not have the password, please call the hospital operator.  05/14/2023, 10:00 AM

## 2023-05-14 NOTE — Progress Notes (Signed)
Subjective:  seems definitely  more oriented-  more uop recorded  -  BUN and crt stalled so far-  K cont to be down -  c/o diarrhea from the lactulose   Objective Vital signs in last 24 hours: Vitals:   05/13/23 1947 05/13/23 2300 05/14/23 0100 05/14/23 0400  BP: 122/65 (!) 111/59 (!) 142/83 116/60  Pulse: 70 70 70 67  Resp: (!) 28 17 (!) 21 16  Temp: 97.8 F (36.6 C) 98.1 F (36.7 C) 98 F (36.7 C) 97.8 F (36.6 C)  TempSrc: Oral Oral Oral Oral  SpO2: 95% 98% 94% 95%  Weight:      Height:       Weight change:   Intake/Output Summary (Last 24 hours) at 05/14/2023 1093 Last data filed at 05/14/2023 0615 Gross per 24 hour  Intake 742.69 ml  Output 1075 ml  Net -332.31 ml    Assessment/Plan: 77 year old WM with cirrhosis , HFrEF, afib on appropriate meds-  also stage 3 CKD- and solitary functioning kidney  now presents found down with AKI 1.Renal- baseline CKD-  crt 1.4-1.6-  last checked in May of 24.  Now with A on CRF in the setting of being found down, rhabdo, hypotension on ARB.  Renal US showed solitary functioning kidney without hydro-  urine pretty bland -  s/p  aggressive hydration- CK down ,   hold ARB- no absolute indications for RRT-  not great that crt has not improved-  possibly established ATN but making urine now so that is better.  edematous -  IVF stopped.  Continue to follow for improvement 2. Hypertension/volume  - dry and hypotensive- aggressive hydration- tank now appears full-  stop  ivf 3. Hypokalemia-  aggressive repletion of this as well -  got another 80 PO and 20 IV today   check level later.  Mag is OK  4. Anemia  - is pretty low to be CKD related-  suspecting a GIB-  transfused 2 units on 8/6-  did have appropriate bump-  GI has started octreotide and lactulose      A     Labs: Basic Metabolic Panel: Recent Labs  Lab 05/12/23 1602 05/12/23 1745 05/13/23 0626 05/13/23 0957 05/13/23 1546 05/14/23 0001  NA  --    < > 134* 134*  135 135  K  --    < > 2.9* 2.6* 3.0* 2.5*  CL  --    < > 91* 96* 96* 97*  CO2  --    < > 25 21* 23 23  GLUCOSE  --    < > 124* 139* 150* 143*  BUN  --    < > 70* 67* 71* 70*  CREATININE  --    < > 9.28* 9.48* 9.46* 9.57*  CALCIUM  --    < > 6.9* 6.8* 6.7* 7.1*  PHOS 5.2*  --  5.9*  --   --  5.5*   < > = values in this interval not displayed.   Liver Function Tests: Recent Labs  Lab 05/12/23 1159 05/14/23 0001  AST 123* 76*  ALT 38 38  ALKPHOS 50 52  BILITOT 2.1* 1.9*  PROT 5.4* 5.6*  ALBUMIN 2.7* 2.7*   No results for input(s): "LIPASE", "AMYLASE" in the last 168 hours. Recent Labs  Lab 05/12/23 1159  AMMONIA 25   CBC: Recent Labs  Lab 05/12/23 1257 05/13/23 0055 05/13/23 0626 05/14/23 0001  WBC 7.3 7.1 7.2 7.6  NEUTROABS 5.3  --   --   --  HGB 5.8* 7.0* 7.3* 7.7*  HCT 17.8* 20.8* 21.8* 23.1*  MCV 85.2 82.5 83.8 84.9  PLT 104* 98* 94* 93*   Cardiac Enzymes: Recent Labs  Lab 05/12/23 1159 05/13/23 0057 05/14/23 0001  CKTOTAL 3,606* 2,587* 1,504*   CBG: No results for input(s): "GLUCAP" in the last 168 hours.  Iron Studies: No results for input(s): "IRON", "TIBC", "TRANSFERRIN", "FERRITIN" in the last 72 hours. Studies/Results: US RENAL  Result Date: 05/12/2023 CLINICAL DATA:  Acute kidney injury EXAM: RENAL / URINARY TRACT ULTRASOUND COMPLETE COMPARISON:  11/07/2022, CT 06/22/2019 FINDINGS: Right Kidney: Atrophic and not well visualized, similar to prior examination. Left Kidney: Renal measurements: 14.4 x 7.0 x 6.7 cm = volume: 353 mL. Echogenicity within normal limits. No mass or hydronephrosis visualized. Bladder: Appears normal for degree of bladder distention. Other: The liver contour is nodular in keeping with changes of cirrhosis. Mild perihepatic ascites is present. IMPRESSION: 1. Atrophic, nonvisualized right kidney. Normal sonographic appearance of the left kidney. 2. Cirrhosis with mild perihepatic ascites. Electronically Signed   By: Helyn Numbers  M.D.   On: 05/12/2023 20:38   DG Elbow Complete Left  Result Date: 05/12/2023 CLINICAL DATA:  Left elbow pain.  Fall. EXAM: LEFT ELBOW - COMPLETE 3+ VIEW COMPARISON:  None Available. FINDINGS: No definite elevation of the distal anterior humeral fat pad indicating an elbow joint effusion. The posterior fat pad also is not visualized. Moderate peripheral medial elbow degenerative spurring at the coronoid process greater than trochlea. Mild proximal radial head-radial notch of the ulna degenerative spurring. No acute fracture is seen. No dislocation. Mild soft tissue swelling posterior to the distal humerus and posterior to the olecranon. IMPRESSION: 1. No acute fracture is seen. 2. Mild soft tissue swelling posterior to the distal humerus and posterior to the olecranon. 3. Moderate medial elbow degenerative spurring. Electronically Signed   By: Neita Garnet M.D.   On: 05/12/2023 13:18   DG Hip Unilat W or Wo Pelvis 2-3 Views Right  Result Date: 05/12/2023 CLINICAL DATA:  Right hip pain.  Fall. EXAM: DG HIP (WITH OR WITHOUT PELVIS) 2-3V RIGHT COMPARISON:  CT abdomen and pelvis 09/28/2008 FINDINGS: There is severe superomedial right femoroacetabular joint space narrowing. Moderate right femoral head-neck junction circumferential degenerative osteophytes. Mild right bilateral superolateral acetabular degenerative osteophytosis. Mild left femoroacetabular joint space narrowing and inferior left femoral head-neck junction degenerative spurring. The pubic symphysis joint space is maintained. Minimal bilateral inferior sacroiliac joint space narrowing. No acute fracture or dislocation. 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 Electronically Signed   By: Neita Garnet M.D.   On: 05/12/2023 13:16   CT Head Wo Contrast  Result Date: 05/12/2023 CLINICAL DATA:  Head trauma, minor (Age >= 65y); Neck trauma (Age >= 65y) EXAM: CT HEAD WITHOUT CONTRAST CT CERVICAL SPINE WITHOUT  CONTRAST TECHNIQUE: Multidetector CT imaging of the head and cervical spine was performed following the standard protocol without intravenous contrast. Multiplanar CT image reconstructions of the cervical spine were also generated. RADIATION DOSE REDUCTION: This exam was performed according to the departmental dose-optimization program which includes automated exposure control, adjustment of the mA and/or kV according to patient size and/or use of iterative reconstruction technique. COMPARISON:  CT Head 06/22/21, CT C Spine 06/22/21 FINDINGS: CT HEAD FINDINGS Brain: No evidence of acute infarction, hemorrhage, hydrocephalus, extra-axial collection or mass lesion/mass effect. There is sequela of mild chronic microvascular ischemic change. Vascular: No hyperdense vessel or unexpected calcification. Skull: Normal. Negative for fracture or focal  lesion. Sinuses/Orbits: No middle ear or mastoid effusion. Paranasal sinuses are clear. Bilateral lens replacement. Orbits are otherwise unremarkable. Other: None. CT CERVICAL SPINE FINDINGS Alignment: Normal. Skull base and vertebrae: No acute fracture. No primary bone lesion or focal pathologic process. Soft tissues and spinal canal: No prevertebral fluid or swelling. No visible canal hematoma. Disc levels:  No evidence of high-grade spinal canal stenosis. Upper chest: Negative. Other: Known IMPRESSION: 1. No acute intracranial abnormality. 2. No acute fracture or traumatic malalignment of the cervical spine. Electronically Signed   By: Lorenza Cambridge M.D.   On: 05/12/2023 12:12   CT Cervical Spine Wo Contrast  Result Date: 05/12/2023 CLINICAL DATA:  Head trauma, minor (Age >= 65y); Neck trauma (Age >= 65y) EXAM: CT HEAD WITHOUT CONTRAST CT CERVICAL SPINE WITHOUT CONTRAST TECHNIQUE: Multidetector CT imaging of the head and cervical spine was performed following the standard protocol without intravenous contrast. Multiplanar CT image reconstructions of the cervical spine were  also generated. RADIATION DOSE REDUCTION: This exam was performed according to the departmental dose-optimization program which includes automated exposure control, adjustment of the mA and/or kV according to patient size and/or use of iterative reconstruction technique. COMPARISON:  CT Head 06/22/21, CT C Spine 06/22/21 FINDINGS: CT HEAD FINDINGS Brain: No evidence of acute infarction, hemorrhage, hydrocephalus, extra-axial collection or mass lesion/mass effect. There is sequela of mild chronic microvascular ischemic change. Vascular: No hyperdense vessel or unexpected calcification. Skull: Normal. Negative for fracture or focal lesion. Sinuses/Orbits: No middle ear or mastoid effusion. Paranasal sinuses are clear. Bilateral lens replacement. Orbits are otherwise unremarkable. Other: None. CT CERVICAL SPINE FINDINGS Alignment: Normal. Skull base and vertebrae: No acute fracture. No primary bone lesion or focal pathologic process. Soft tissues and spinal canal: No prevertebral fluid or swelling. No visible canal hematoma. Disc levels:  No evidence of high-grade spinal canal stenosis. Upper chest: Negative. Other: Known IMPRESSION: 1. No acute intracranial abnormality. 2. No acute fracture or traumatic malalignment of the cervical spine. Electronically Signed   By: Lorenza Cambridge M.D.   On: 05/12/2023 12:12   Medications: Infusions:  cefTRIAXone (ROCEPHIN)  IV 1 g (05/13/23 1121)   pantoprazole 8 mg/hr (05/13/23 2345)    Scheduled Medications:  amiodarone  200 mg Oral Daily   lactulose  10 g Oral Daily   sodium chloride flush  3 mL Intravenous Q12H    have reviewed scheduled and prn medications.  Physical Exam: General: maybe a little more oriented this AM-  says has made lots of urine Heart: irreg Lungs: dec BS at bases Abdomen: soft, non tender  Extremities: pitting edema     05/14/2023,9:23 AM  LOS: 2 days

## 2023-05-14 NOTE — Progress Notes (Addendum)
Daily Progress Note  DOA: 05/12/2023 Hospital Day: 3 Chief Complaint:  anemia, cirrhosis  ASSESSMENT & PLAN   Brief Narrative:  Cody Hines. is a 77 y.o. year old male with a history of  cirrhosis, colon polyps, IBS, GERD, colon AVMs, OSA, BPH, HTN, CKD, Afib s/p DCCV   Cirrhosis with portal hypertension with history of hepatic encephalopathy. No previous history of ascites or varices. Admitted with altered mental status , AKI / rhabdomyolysis and severe anemia / FOBT+. Denies overt GI bleeding. Occult GI bleeding for portal HTN gastropathy?  Negative HCC screening within last 6 months MELD 3.0: 27 at 05/14/2023 12:01 AM. * High MELD driven by AKI INR 1.6 --Dx paracentesis ordered --Continue empiric Rocephin Q 24 hours, on day # 2.  --No overt GI bleeding. Changing PPI from IV to PO BID.  --He says that he is incontinent of liquid stool multiple times a day but only one BM documented yesterday so I will confirm with RN. I will d/c  Lactulose if having excessive BMs. He is alert and oriented. No asterixis on exam today.  --From GI standpoint can have a 2 gram sodium diet but Nephrology may have some dietary restrictions from their standpoint.    --Hgb improved from 5.8 to 7.7 post 2 u PRBC. Anemia likely multifactorial (part GI blood loss and part AKI). Iron studies pending. He is up to date on colonoscopy ( July 2023) --EGD at some point this admission     AKI  / Rhabomyolysis -Worsening Cr which is 9.57 today. Nephrology evaluating.    Severe hypokalemia.  -K+ still low at 2.5   A-Fib.  Home Eliquis is on hold.   ------------------------------------------------------------------------------------------------------    [facility] Attending   I have taken an interval history, reviewed the chart and examined the patient. I agree with the Advanced Practitioner's note, impression and recommendations with the following additions:  Paracentesis c/w SBP - stay on CTX  - increase to 2 g daily and add daily albumin given concomitant renal failure (thgese are poor prognostoc signs)  Will defer octreotide and midodrine to TRH/renal  Hope kidney fx improves - not a good HD candidate - and if he does not have sufficient renal recovery goals of care discussion needed  We will f/u and determine timing of EGD which is not urgent and not absolutely indicated but would provide useful info   Iva Boop, MD, [facility] See Loretha Stapler on call - gastroenterology for best contact person 05/14/2023 3:15 PM   Subjective   Complains of bowel incontinence ( loose stool). No other complaints   Objective    Recent Labs    05/13/23 0055 05/13/23 0626 05/14/23 0001  WBC 7.1 7.2 7.6  HGB 7.0* 7.3* 7.7*  HCT 20.8* 21.8* 23.1*  PLT 98* 94* 93*   BMET Recent Labs    05/13/23 0957 05/13/23 1546 05/14/23 0001  NA 134* 135 135  K 2.6* 3.0* 2.5*  CL 96* 96* 97*  CO2 21* 23 23  GLUCOSE 139* 150* 143*  BUN 67* 71* 70*  CREATININE 9.48* 9.46* 9.57*  CALCIUM 6.8* 6.7* 7.1*   LFT Recent Labs    05/14/23 0001  PROT 5.6*  ALBUMIN 2.7*  AST 76*  ALT 38  ALKPHOS 52  BILITOT 1.9*   PT/INR Recent Labs    05/13/23 1002  LABPROT 19.6*  INR 1.6*     Imaging:  US RENAL CLINICAL DATA:  Acute kidney injury  EXAM: RENAL / URINARY TRACT ULTRASOUND COMPLETE  COMPARISON:  11/07/2022, CT 06/22/2019  FINDINGS: Right Kidney:  Atrophic and not well visualized, similar to prior examination.  Left Kidney:  Renal measurements: 14.4 x 7.0 x 6.7 cm = volume: 353 mL. Echogenicity within normal limits. No mass or hydronephrosis visualized.  Bladder:  Appears normal for degree of bladder distention.  Other:  The liver contour is nodular in keeping with changes of cirrhosis. Mild perihepatic ascites is present.  IMPRESSION: 1. Atrophic, nonvisualized right kidney. Normal sonographic appearance of the left kidney. 2. Cirrhosis with  mild perihepatic ascites.  Electronically Signed   By: Helyn Numbers M.D.   On: 05/12/2023 20:38 DG Elbow Complete Left CLINICAL DATA:  Left elbow pain.  Fall.  EXAM: LEFT ELBOW - COMPLETE 3+ VIEW  COMPARISON:  None Available.  FINDINGS: No definite elevation of the distal anterior humeral fat pad indicating an elbow joint effusion. The posterior fat pad also is not visualized.  Moderate peripheral medial elbow degenerative spurring at the coronoid process greater than trochlea. Mild proximal radial head-radial notch of the ulna degenerative spurring.  No acute fracture is seen. No dislocation. Mild soft tissue swelling posterior to the distal humerus and posterior to the olecranon.  IMPRESSION: 1. No acute fracture is seen. 2. Mild soft tissue swelling posterior to the distal humerus and posterior to the olecranon. 3. Moderate medial elbow degenerative spurring.  Electronically Signed   By: Neita Garnet M.D.   On: 05/12/2023 13:18 DG Hip Unilat W or Wo Pelvis 2-3 Views Right CLINICAL DATA:  Right hip pain.  Fall.  EXAM: DG HIP (WITH OR WITHOUT PELVIS) 2-3V RIGHT  COMPARISON:  CT abdomen and pelvis 09/28/2008  FINDINGS: There is severe superomedial right femoroacetabular joint space narrowing. Moderate right femoral head-neck junction circumferential degenerative osteophytes. Mild right bilateral superolateral acetabular degenerative osteophytosis. Mild left femoroacetabular joint space narrowing and inferior left femoral head-neck junction degenerative spurring.  The pubic symphysis joint space is maintained. Minimal bilateral inferior sacroiliac joint space narrowing.  No acute fracture or dislocation.  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  Electronically Signed   By: Neita Garnet M.D.   On: 05/12/2023 13:16 CT Cervical Spine Wo Contrast CLINICAL DATA:  Head trauma, minor (Age >= 65y); Neck trauma (Age  >= 65y)  EXAM: CT HEAD WITHOUT CONTRAST  CT CERVICAL SPINE WITHOUT CONTRAST  TECHNIQUE: Multidetector CT imaging of the head and cervical spine was performed following the standard protocol without intravenous contrast. Multiplanar CT image reconstructions of the cervical spine were also generated.  RADIATION DOSE REDUCTION: This exam was performed according to the departmental dose-optimization program which includes automated exposure control, adjustment of the mA and/or kV according to patient size and/or use of iterative reconstruction technique.  COMPARISON:  CT Head 06/22/21, CT C Spine 06/22/21  FINDINGS: CT HEAD FINDINGS  Brain: No evidence of acute infarction, hemorrhage, hydrocephalus, extra-axial collection or mass lesion/mass effect. There is sequela of mild chronic microvascular ischemic change.  Vascular: No hyperdense vessel or unexpected calcification.  Skull: Normal. Negative for fracture or focal lesion.  Sinuses/Orbits: No middle ear or mastoid effusion. Paranasal sinuses are clear. Bilateral lens replacement. Orbits are otherwise unremarkable.  Other: None.  CT CERVICAL SPINE FINDINGS  Alignment: Normal.  Skull base and vertebrae: No acute fracture. No primary bone lesion or focal pathologic process.  Soft tissues and spinal canal: No prevertebral fluid or swelling. No visible canal hematoma.  Disc levels:  No  evidence of high-grade spinal canal stenosis.  Upper chest: Negative.  Other: Known  IMPRESSION: 1. No acute intracranial abnormality. 2. No acute fracture or traumatic malalignment of the cervical spine.  Electronically Signed   By: Lorenza Cambridge M.D.   On: 05/12/2023 12:12 CT Head Wo Contrast CLINICAL DATA:  Head trauma, minor (Age >= 65y); Neck trauma (Age >= 65y)  EXAM: CT HEAD WITHOUT CONTRAST  CT CERVICAL SPINE WITHOUT CONTRAST  TECHNIQUE: Multidetector CT imaging of the head and cervical spine was performed  following the standard protocol without intravenous contrast. Multiplanar CT image reconstructions of the cervical spine were also generated.  RADIATION DOSE REDUCTION: This exam was performed according to the departmental dose-optimization program which includes automated exposure control, adjustment of the mA and/or kV according to patient size and/or use of iterative reconstruction technique.  COMPARISON:  CT Head 06/22/21, CT C Spine 06/22/21  FINDINGS: CT HEAD FINDINGS  Brain: No evidence of acute infarction, hemorrhage, hydrocephalus, extra-axial collection or mass lesion/mass effect. There is sequela of mild chronic microvascular ischemic change.  Vascular: No hyperdense vessel or unexpected calcification.  Skull: Normal. Negative for fracture or focal lesion.  Sinuses/Orbits: No middle ear or mastoid effusion. Paranasal sinuses are clear. Bilateral lens replacement. Orbits are otherwise unremarkable.  Other: None.  CT CERVICAL SPINE FINDINGS  Alignment: Normal.  Skull base and vertebrae: No acute fracture. No primary bone lesion or focal pathologic process.  Soft tissues and spinal canal: No prevertebral fluid or swelling. No visible canal hematoma.  Disc levels:  No evidence of high-grade spinal canal stenosis.  Upper chest: Negative.  Other: Known  IMPRESSION: 1. No acute intracranial abnormality. 2. No acute fracture or traumatic malalignment of the cervical spine.  Electronically Signed   By: Lorenza Cambridge M.D.   On: 05/12/2023 12:12     Scheduled inpatient medications:   amiodarone  200 mg Oral Daily   lactulose  10 g Oral Daily   sodium chloride flush  3 mL Intravenous Q12H   Continuous inpatient infusions:   cefTRIAXone (ROCEPHIN)  IV 1 g (05/13/23 1121)   pantoprazole 8 mg/hr (05/13/23 2345)   PRN inpatient medications: acetaminophen **OR** acetaminophen  Vital signs in last 24 hours: Temp:  [97.8 F (36.6 C)-98.4 F (36.9 C)] 97.8 F  (36.6 C) (08/08 0400) Pulse Rate:  [66-70] 67 (08/08 0400) Resp:  [12-28] 16 (08/08 0400) BP: (100-142)/(55-83) 116/60 (08/08 0400) SpO2:  [88 %-99 %] 95 % (08/08 0400) Last BM Date : 05/13/23  Intake/Output Summary (Last 24 hours) at 05/14/2023 0913 Last data filed at 05/14/2023 0615 Gross per 24 hour  Intake 742.69 ml  Output 1075 ml  Net -332.31 ml    Intake/Output from previous day: 08/07 0701 - 08/08 0700 In: 742.7 [P.O.:120; I.V.:222.7; IV Piggyback:400] Out: 1075 [Urine:1075] Intake/Output this shift: No intake/output data recorded.   Physical Exam:  General: Alert male in NAD Heart:  Regular rate .  Pulmonary: Normal respiratory effort Abdomen: Soft, nondistended, nontender. Normal bowel sounds. Neurologic: Alert and oriented. No asterixis appreciated Psych: Pleasant. Cooperative. Insight appears normal.    Principal Problem:   AKI (acute kidney injury) (HCC) Active Problems:   Essential hypertension   GERD (gastroesophageal reflux disease)   Fibromyalgia   Chronic lumbar pain   Major depressive disorder, recurrent episode, moderate (HCC)   Hyperlipidemia, mixed   CKD (chronic kidney disease) stage 3, GFR 30-59 ml/min (HCC)   OSA on CPAP   MCI (mild cognitive impairment)   A-fib (HCC)  Cirrhosis (HCC)   GI bleed   Hypokalemia   Uremia   Acute encephalopathy     LOS: 2 days   Willette Cluster ,NP 05/14/2023, 9:13 AM

## 2023-05-14 NOTE — Evaluation (Signed)
Physical Therapy Evaluation Patient Details Name: Cody Hines. MRN: [id number] DOB: Feb 25, 1946 Today's Date: 05/14/2023  History of Present Illness  77 yo male presenting to ED 8/6 with AMS. Found by ex-wife confused in his bed, incontinent of stool. CTH negative. Rt hip and Lt elbow xrays negative. +AKI, dehydration, hypotension, hypokalemia, Hgb 5.8 (transfused 2 units)  PMH  significant of HTN, OSA on CPAP, MCI, multiple spinal compression fxs, CKD, afib, cirrhosis, fibromyalgia, mild cognitive impairment  Clinical Impression   Pt admitted secondary to problem above with deficits below. PTA patient was living alone and using a cane vs RW for ambulation. He has 3 steps to enter multi-level home with 7 steps up to his bedroom. He is not sure, but he thinks he fell down the steps. Pt currently requires min assist for bed level mobility and further mobility deferred due to continuous diarrhea. Will need further assessment of mobility to allow for recommendation of post-acute care.  Anticipate patient will benefit from PT to address problems listed below.Will continue to follow acutely to maximize functional mobility independence and safety.           If plan is discharge home, recommend the following: Other (comment) (currently not appropriate)   Can travel by private vehicle        Equipment Recommendations None recommended by PT  Recommendations for Other Services       Functional Status Assessment Patient has had a recent decline in their functional status and demonstrates the ability to make significant improvements in function in a reasonable and predictable amount of time.     Precautions / Restrictions Precautions Precautions: Fall Precaution Comments: pt reports he doesn't remember falling, but he was told he did Restrictions Weight Bearing Restrictions: No      Mobility  Bed Mobility Overal bed mobility: Needs Assistance Bed Mobility: Rolling Rolling: Min  assist, Used rails         General bed mobility comments: rolling and noted pt incontinent of diarrhea; nurse tech in to assist with cleaning pt and he continued to have diarrhea--not appropriate for further mobility at this time    Transfers                   General transfer comment: unable to assess due to diarrhea    Ambulation/Gait               General Gait Details: unable to assess due to diarrhea  Stairs            Wheelchair Mobility     Tilt Bed    Modified Rankin (Stroke Patients Only)       Balance                                             Pertinent Vitals/Pain Pain Assessment Pain Assessment: Faces Faces Pain Scale: Hurts even more Pain Location: back Pain Descriptors / Indicators: Aching Pain Intervention(s): Limited activity within patient's tolerance, Monitored during session    Home Living Family/patient expects to be discharged to:: Private residence Living Arrangements: Alone   Type of Home: House Home Access: Ramped entrance     Alternate Level Stairs-Number of Steps: 7 (from main level up to bedroom; more steps down to lower level where laundry is--he does not go down there) Home Layout: Multi-level Home Equipment: Cane - single point;BSC/3in1;Shower seat;Rolling  Walker (2 wheels)      Prior Function Prior Level of Function : Independent/Modified Independent;Driving             Mobility Comments: uses cane inside (sometimes RW), uses RW outside       Extremity/Trunk Assessment   Upper Extremity Assessment Upper Extremity Assessment: Defer to OT evaluation    Lower Extremity Assessment Lower Extremity Assessment: Generalized weakness;RLE deficits/detail;LLE deficits/detail RLE Deficits / Details: difficulty raising RLE off bed more than 2" due to rt hip pain (xray negative) LLE Deficits / Details: difficulty raising leg >2" off bed due to back pain    Cervical / Trunk  Assessment Cervical / Trunk Assessment: Other exceptions Cervical / Trunk Exceptions: h/o compression fractures with persistent pain  Communication   Communication Communication: Difficulty communicating thoughts/reduced clarity of speech Cueing Techniques: Verbal cues;Tactile cues;Gestural cues  Cognition Arousal: Alert Behavior During Therapy: Flat affect Overall Cognitive Status: No family/caregiver present to determine baseline cognitive functioning                                 General Comments: a&ox4; following simple commands        General Comments      Exercises     Assessment/Plan    PT Assessment Patient needs continued PT services  PT Problem List Decreased strength;Decreased activity tolerance;Decreased balance;Decreased mobility;Decreased knowledge of use of DME;Cardiopulmonary status limiting activity;Pain       PT Treatment Interventions DME instruction;Gait training;Stair training;Functional mobility training;Therapeutic activities;Therapeutic exercise;Balance training;Patient/family education    PT Goals (Current goals can be found in the Care Plan section)  Acute Rehab PT Goals Patient Stated Goal: to be able to return home PT Goal Formulation: With patient Time For Goal Achievement: 05/28/23 Potential to Achieve Goals: Good    Frequency Min 1X/week     Co-evaluation               AM-PAC PT "6 Clicks" Mobility  Outcome Measure Help needed turning from your back to your side while in a flat bed without using bedrails?: A Little Help needed moving from lying on your back to sitting on the side of a flat bed without using bedrails?: Total Help needed moving to and from a bed to a chair (including a wheelchair)?: Total Help needed standing up from a chair using your arms (e.g., wheelchair or bedside chair)?: Total Help needed to walk in hospital room?: Total Help needed climbing 3-5 steps with a railing? : Total 6 Click Score:  8    End of Session Equipment Utilized During Treatment: Oxygen Activity Tolerance: Treatment limited secondary to medical complications (Comment) (diarrhea) Patient left: in bed;with call bell/phone within reach;with bed alarm set;with nursing/sitter in room Nurse Communication: Mobility status;Other (comment) (limited by diarrhea; NT ?d if rectal pouch appropriate) PT Visit Diagnosis: History of falling (Z91.81);Muscle weakness (generalized) (M62.81)    Time: 8295-6213 PT Time Calculation (min) (ACUTE ONLY): 24 min   Charges:   PT Evaluation $PT Eval Low Complexity: 1 Low   PT General Charges $$ ACUTE PT VISIT: 1 Visit          Jerolyn Center, PT Acute Rehabilitation Services  Office [phone]   Zena Amos 05/14/2023, 9:13 AM

## 2023-05-15 DIAGNOSIS — K652 Spontaneous bacterial peritonitis: Secondary | ICD-10-CM

## 2023-05-15 DIAGNOSIS — R710 Precipitous drop in hematocrit: Secondary | ICD-10-CM

## 2023-05-15 DIAGNOSIS — K7031 Alcoholic cirrhosis of liver with ascites: Secondary | ICD-10-CM | POA: Diagnosis not present

## 2023-05-15 DIAGNOSIS — N179 Acute kidney failure, unspecified: Secondary | ICD-10-CM | POA: Diagnosis not present

## 2023-05-15 DIAGNOSIS — D649 Anemia, unspecified: Secondary | ICD-10-CM | POA: Diagnosis not present

## 2023-05-15 DIAGNOSIS — K7460 Unspecified cirrhosis of liver: Secondary | ICD-10-CM | POA: Diagnosis not present

## 2023-05-15 DIAGNOSIS — E875 Hyperkalemia: Secondary | ICD-10-CM

## 2023-05-15 DIAGNOSIS — R4182 Altered mental status, unspecified: Secondary | ICD-10-CM

## 2023-05-15 MED ORDER — POTASSIUM CHLORIDE CRYS ER 20 MEQ PO TBCR
40.0000 meq | EXTENDED_RELEASE_TABLET | Freq: Once | ORAL | Status: AC
  Administered 2023-05-15: 40 meq via ORAL
  Filled 2023-05-15: qty 2

## 2023-05-15 MED ORDER — HEPARIN SODIUM (PORCINE) 5000 UNIT/ML IJ SOLN
5000.0000 [IU] | Freq: Three times a day (TID) | INTRAMUSCULAR | Status: DC
  Administered 2023-05-15 – 2023-05-16 (×3): 5000 [IU] via SUBCUTANEOUS
  Filled 2023-05-15 (×3): qty 1

## 2023-05-15 NOTE — Progress Notes (Signed)
Speech Language Pathology Treatment: Dysphagia  Patient Details Name: Cody Hines. MRN: [id number] DOB: 1946/08/31 Today's Date: 05/15/2023 Time: 4010-2725 SLP Time Calculation (min) (ACUTE ONLY): 10 min  Assessment / Plan / Recommendation Clinical Impression  Per GI, pt able to upgrade to more solid diet. Pt increasingly alert today. SLP observed pt with trials of thin liquids, purees, and solids with no overt s/s of dysphagia or aspiration. Recommend regular diet with thin liquids. Discussed with RN and pt who agree. No further SLP f/u is needed.   HPI HPI: Cody Hines is a 77 yo male presenting to ED 8/6 with AMS. CTH negative. Pt undergoing workup with GI, who cleared him for a clear liquid diet. PMH includes HTN, HLD, GERD, CKD 3, A-fib, OSA, cirrhosis, alcohol use, fibromyalgia, chronic pain, depression, mild cognitive impairment, compression fractures      SLP Plan  All goals met      Recommendations for follow up therapy are one component of a multi-disciplinary discharge planning process, led by the attending physician.  Recommendations may be updated based on patient status, additional functional criteria and insurance authorization.    Recommendations  Diet recommendations: Regular;Thin liquid Liquids provided via: Cup;Straw Medication Administration: Whole meds with liquid Supervision: Patient able to self feed Compensations: Slow rate;Small sips/bites Postural Changes and/or Swallow Maneuvers: Seated upright 90 degrees;Upright 30-60 min after meal                  Oral care BID   Frequent or constant Supervision/Assistance Dysphagia, unspecified (R13.10)     All goals met     Gwynneth Aliment, M.A., CF-SLP Speech Language Pathology, Acute Rehabilitation Services  Secure Chat preferred [phone]   05/15/2023, 1:49 PM

## 2023-05-15 NOTE — Progress Notes (Signed)
Vitals rechecked patient turned to green

## 2023-05-15 NOTE — Plan of Care (Signed)

## 2023-05-15 NOTE — Care Management Important Message (Signed)
Important Message  Patient Details  Name: Cody Hines. MRN: [id number] Date of Birth: 1946/06/30   Medicare Important Message Given:  Yes       05/15/2023, 4:16 PM

## 2023-05-15 NOTE — Progress Notes (Signed)
Occupational Therapy Treatment Patient Details Name: Cody Hines [name]. MRN: [id number] DOB: 09-10-1946 Today's Date: 05/15/2023   History of present illness 77 yo male presenting to ED 8/6 with AMS. Found by ex-wife confused in his bed, incontinent of stool. CTH negative. Rt hip and Lt elbow xrays negative. +AKI, dehydration, hypotension, hypokalemia, Hgb 5.8 (transfused 2 units)  PMH  significant of HTN, OSA on CPAP, MCI, multiple spinal compression fxs, CKD, afib, cirrhosis, fibromyalgia, mild cognitive impairment   OT comments  Pt with improved alertness and improved cognition this day with pt demonstrating Selective attention, Emergent awareness, and ability to follow 2-step commands in 6/10 opportunities. Pt currently demonstrates ability to complete UB ADLs Set up to Min assist, LB ADLs with Max to Total assist +2, and sit/stand functional transfers with Mod assist. Pt progressing to sitting and standing at EOB this day with Mod to Max assist +2 for bed mobility and Poor balance in sitting and standing. Pt with episode of bowel incontinence this session with pt requiring Total assist +2 for peri-care in standing. Pt participated well in session and is making progress toward OT goals. Pt will benefit from continued acute skilled OT services to address deficits outlined below, decrease caregiver burden, and improve safety and independence with ADLs and functional transfers/mobility. Post acute discharge, pt will benefit from intensive inpatient skilled rehab services < 3 hours per day to maximize rehab potential.       If plan is discharge home, recommend the following:  Two people to help with walking and/or transfers;Two people to help with bathing/dressing/bathroom;Assistance with cooking/housework;Assistance with feeding;Direct supervision/assist for medications management;Direct supervision/assist for financial management;Assist for transportation;Help with stairs or ramp for  entrance;Supervision due to cognitive status (Set up for self feeding)   Equipment Recommendations  Other (comment) (defer to next level of care)    Recommendations for Other Services      Precautions / Restrictions Precautions Precautions: Fall Precaution Comments: pt reports he doesn't remember falling, but he was told he did Restrictions Weight Bearing Restrictions: No       Mobility Bed Mobility Overal bed mobility: Needs Assistance Bed Mobility: Rolling, Supine to Sit, Sit to Supine Rolling: Min assist, Used rails   Supine to sit: Mod assist Sit to supine: Max assist, +2 for physical assistance, Total assist   General bed mobility comments: Pt was Min A for rolling with use of bedrail and verbal cues for sequencing. Pt was Mod a for trunk to get to mid line and for progressing to EOB and Max A +2 or (total) assist for sitting to supine with assist at trunk and bil LE due to back pain and difficulty staying on task and  following directions.    Transfers Overall transfer level: Needs assistance Equipment used: Rolling walker (2 wheels) Transfers: Sit to/from Stand Sit to Stand: Mod assist           General transfer comment: Pt requires verbal cues for safe hand placement. Performed 3x with good recall on third attempt when cued for correct hand placement. Mod A initial momentum to get to standing.     Balance Overall balance assessment: Needs assistance Sitting-balance support: Bilateral upper extremity supported, Feet supported, Feet unsupported Sitting balance-Leahy Scale: Poor Sitting balance - Comments: initially hard posterior lean with assistance from OT to remain sitting upright. Improved to SBA with cueing   Standing balance support: Bilateral upper extremity supported, During functional activity, Reliant on assistive device for balance Standing balance-Leahy Scale: Poor Standing  balance comment: Pt has poor standing balance with heavy posterior lean  initially for each sit to stand requiring verbal cues to bring COM over BOS.                           ADL either performed or assessed with clinical judgement   ADL Overall ADL's : Needs assistance/impaired                 Upper Body Dressing : Minimal assistance;Cueing for sequencing;Sitting   Lower Body Dressing: Total assistance;Sit to/from stand       Toileting- Architect and Hygiene: Total assistance;+2 for safety/equipment;Sit to/from stand              Extremity/Trunk Assessment Upper Extremity Assessment Upper Extremity Assessment: Generalized weakness;Right hand dominant;RUE deficits/detail;LUE deficits/detail RUE Deficits / Details: generalized weakness, mildly edematous hand, impaired shoulder ROM at baseline secondary to prior injury; AROM shoulder flexion to approx. 100 degrees, decreased sensation, impaired fine and gross motor coordination RUE Sensation: decreased proprioception RUE Coordination: decreased fine motor;decreased gross motor LUE Deficits / Details: generalized weakness, mildly edematous hand, impaired shoulder ROM at baseline secondary to prior injury; AROM shoulder flexion to approx. 70 degrees, AAROM to approx 90 degrees, decreased sensation, impaired fine and gross motor coordination LUE Sensation: decreased proprioception LUE Coordination: decreased fine motor;decreased gross motor   Lower Extremity Assessment Lower Extremity Assessment: Defer to PT evaluation        Vision       Perception     Praxis      Cognition Arousal: Alert Behavior During Therapy: WFL for tasks assessed/performed Overall Cognitive Status: No family/caregiver present to determine baseline cognitive functioning                                 General Comments: Pt conitnues to present with cognitive impairements outlined in OT eval on 8/8. However, pt demonstrates improved alertness, mildly improved attention to task,  and ability to consistantly follow 1 step commands this day. Pt currently completes 2-step commands appropriately in 6/10 opportunities throughout session.        Exercises      Shoulder Instructions       General Comments Pt VSS on RA throughout session. Pt on 2L continuous O2 through nasal cannula upon arrival and placed back on O2 at end of session. Pt with episode of bowel incontience requiring Total assist +2 for peri-care in standing. NT present at end of session.    Pertinent Vitals/ Pain       Pain Assessment Pain Assessment: Faces Faces Pain Scale: Hurts even more Pain Location: back and stomach Pain Descriptors / Indicators: Aching, Discomfort, Grimacing, Guarding Pain Intervention(s): Limited activity within patient's tolerance, Monitored during session, Repositioned  Home Living                                          Prior Functioning/Environment              Frequency  Min 1X/week        Progress Toward Goals  OT Goals(current goals can now be found in the care plan section)  Progress towards OT goals: Progressing toward goals  Acute Rehab OT Goals Patient Stated Goal: To stop having bowel incontinence, talk with his  son, and return home  Plan      Co-evaluation    PT/OT/SLP Co-Evaluation/Treatment: Yes Reason for Co-Treatment: Complexity of the patient's impairments (multi-system involvement);Necessary to address cognition/behavior during functional activity;For patient/therapist safety;To address functional/ADL transfers PT goals addressed during session: Mobility/safety with mobility;Proper use of DME OT goals addressed during session: ADL's and self-care;Other (comment) (Cognition)      AM-PAC OT "6 Clicks" Daily Activity     Outcome Measure   Help from another person eating meals?: A Little Help from another person taking care of personal grooming?: A Little Help from another person toileting, which includes using  toliet, bedpan, or urinal?: Total Help from another person bathing (including washing, rinsing, drying)?: A Lot Help from another person to put on and taking off regular upper body clothing?: A Little Help from another person to put on and taking off regular lower body clothing?: Total 6 Click Score: 13    End of Session Equipment Utilized During Treatment: Gait belt;Rolling walker (2 wheels);Oxygen  OT Visit Diagnosis: Muscle weakness (generalized) (M62.81);Ataxia, unspecified (R27.0);Other symptoms and signs involving cognitive function;History of falling (Z91.81)   Activity Tolerance Patient tolerated treatment well   Patient Left in bed;with call bell/phone within reach;with bed alarm set;with nursing/sitter in room   Nurse Communication Mobility status;Other (comment) (Pt with episode of bowel incontinence.)        Time: 3244-0102 OT Time Calculation (min): 42 min  Charges: OT General Charges $OT Visit: 1 Visit OT Treatments $Self Care/Home Management : 8-22 mins   "Orson Eva., OTR/L, MA Acute Rehab [phone]   Lendon Colonel 05/15/2023, 4:28 PM

## 2023-05-15 NOTE — Progress Notes (Signed)
Physical Therapy Treatment Patient Details Name: Cody Hines. MRN: [id number] DOB: [birth year] Today's Date: 05/15/2023   History of Present Illness 77 yo male presenting to ED 8/6 with AMS. Found by ex-wife confused in his bed, incontinent of stool. CTH negative. Rt hip and Lt elbow xrays negative. +AKI, dehydration, hypotension, hypokalemia, Hgb 5.8 (transfused 2 units)  PMH  significant of HTN, OSA on CPAP, MCI, multiple spinal compression fxs, CKD, afib, cirrhosis, fibromyalgia, mild cognitive impairment    PT Comments  Pt improved from initial evaluation working towards goals. Currently pt is Mod a for bed mobility and sit to stand. He was unable to progress gait this session due to poor balance and LE weakness with very low foot clearance of the LLE with AD. Pt is currently at a very high risk for falls. Due to pt current functional status, home set up and available assistance at home recommending skilled physical therapy services at a higher level of care and frequency (5x/weekly) on discharge from acute care hospital setting in order to decrease risk for falls, immobility, skin breakdown, injury and re-hospitalization.     If plan is discharge home, recommend the following: A little help with walking and/or transfers;Help with stairs or ramp for entrance;Assistance with cooking/housework;Assist for transportation   Can travel by private vehicle     No  Equipment Recommendations  None recommended by PT       Precautions / Restrictions Precautions Precautions: Fall Restrictions Weight Bearing Restrictions: No     Mobility  Bed Mobility Overal bed mobility: Needs Assistance Bed Mobility: Rolling, Supine to Sit, Sit to Supine Rolling: Min assist, Used rails   Supine to sit: Mod assist Sit to supine: Max assist, +2 for physical assistance, Total assist   General bed mobility comments: Pt was Min A for rolling with use of bedrail and verbal cues for sequencing. Pt  was Mod a for trunk to get to mid line and for progressing to EOB and Max A +2 or (total) assist for sitting to supine with assist at trunk and bil LE due to back pain and difficulty staying on task and  following directions. Patient Response: Cooperative  Transfers Overall transfer level: Needs assistance Equipment used: Rolling walker (2 wheels) Transfers: Sit to/from Stand Sit to Stand: Mod assist           General transfer comment: Pt requires verbal cues for safe hand placement. Performed 3x with good recall on third attempt when cued for correct hand placement. Mod A initial momentum to get to standing.    Ambulation/Gait   Pre-gait activities: Side steps taken at EOB with posterior lean and assist required for wgt shifting. Pt had very low foot clearance on the L.     Stairs Stairs:  (not safe to attempt at this time.)           Wheelchair Mobility     Tilt Bed Tilt Bed Patient Response: Cooperative  Modified Rankin (Stroke Patients Only)       Balance Overall balance assessment: Needs assistance Sitting-balance support: Bilateral upper extremity supported, Feet supported, Feet unsupported Sitting balance-Leahy Scale: Poor Sitting balance - Comments: initially hard posterior lean with assistance from OT to remain sitting upright. Improved to SBA with cueing   Standing balance support: Bilateral upper extremity supported Standing balance-Leahy Scale: Poor Standing balance comment: Pt has poor standing balance with heavy posterior lean initially for each sit to stand requiring verbal cues to bring COM over BOS.  Cognition Arousal: Alert Behavior During Therapy: WFL for tasks assessed/performed Overall Cognitive Status: No family/caregiver present to determine baseline cognitive functioning         General Comments: per chart pt has history of cognitive impairments. Pt requires verbal cues for safety and occasoinally for sequencing.            General Comments General comments (skin integrity, edema, etc.): Pt was on Room air with O2 sats and HR remaining WNL. Pt continues with bowel incontenence and was assisted with personal care.      Pertinent Vitals/Pain Pain Assessment Pain Assessment: Faces Faces Pain Scale: Hurts even more Pain Location: back and stomach Pain Descriptors / Indicators: Aching, Discomfort, Grimacing, Guarding Pain Intervention(s): Monitored during session, Limited activity within patient's tolerance     PT Goals (current goals can now be found in the care plan section) Acute Rehab PT Goals Patient Stated Goal: to be able to return home PT Goal Formulation: With patient Time For Goal Achievement: 05/28/23 Potential to Achieve Goals: Good Progress towards PT goals: Progressing toward goals    Frequency    Min 1X/week      PT Plan  Continue with current POC    Co-evaluation PT/OT/SLP Co-Evaluation/Treatment: Yes Reason for Co-Treatment: Complexity of the patient's impairments (multi-system involvement);Necessary to address cognition/behavior during functional activity;For patient/therapist safety;To address functional/ADL transfers PT goals addressed during session: Mobility/safety with mobility;Proper use of DME        AM-PAC PT "6 Clicks" Mobility   Outcome Measure  Help needed turning from your back to your side while in a flat bed without using bedrails?: A Little Help needed moving from lying on your back to sitting on the side of a flat bed without using bedrails?: A Lot Help needed moving to and from a bed to a chair (including a wheelchair)?: A Lot Help needed standing up from a chair using your arms (e.g., wheelchair or bedside chair)?: A Lot Help needed to walk in hospital room?: Total Help needed climbing 3-5 steps with a railing? : Total 6 Click Score: 11    End of Session   Activity Tolerance: Patient tolerated treatment well Patient left: in bed;with call bell/phone  within reach;with bed alarm set Nurse Communication: Mobility status PT Visit Diagnosis: History of falling (Z91.81);Muscle weakness (generalized) (M62.81)     Time: 7846-9629 PT Time Calculation (min) (ACUTE ONLY): 44 min  Charges:    $Therapeutic Activity: 8-22 mins PT General Charges $$ ACUTE PT VISIT: 1 Visit                     Harrel Carina, DPT, CLT  Acute Rehabilitation Services Office: [phone] (Secure chat preferred)    Claudia Desanctis 05/15/2023, 10:41 AM

## 2023-05-15 NOTE — Progress Notes (Signed)
PROGRESS NOTE        PATIENT DETAILS Name: Cody Hines. Age: 77 y.o. Sex: male Date of Birth: July 26, 1946 Admit Date: 05/12/2023 Admitting Physician Synetta Fail, MD GEX:[id number], [name], MD  Brief Summary: Patient is a 77 y.o.  male with history of HTN, HLD, PAF on Eliquis, cirrhosis, EtOH use, depression, mild cognitive impairment who was found by significant other confused (had not heard from him for 10 days-separated from wife).  He was found to have severe anemia, AKI, rhabdomyolysis, encephalopathy and subsequently admitted to the hospitalist service.  He was provided with supportive care-including IV fluids/blood transfusion/Rocephin nephrology and gastroenterology followed closely-paracentesis on 8/8 was consistent with SBP.    Significant events: 8/6>> admit to [facility]  Significant studies: 8/6>> CT head: No acute intracranial abnormality 8/6>> CT C-spine: No fracture/dislocation 8/6>> x-ray right hip: Moderate to severe OA 8/6>> x-ray left elbow: No fracture 8/6>> renal ultrasound: Atrophic nonvisualized right kidney-normal sonographic appearance of left kidney.  Significant microbiology data: 8/8>> peritoneal fluid culture: No growth  Procedures: 8/8>> paracentesis (WBC 1339 with 79% neutrophils)  Consults: Nephrology GI  Subjective: Awake/alert-no major issues overnight.  Objective: Vitals: Blood pressure (!) 138/58, pulse 71, temperature 98.2 F (36.8 C), temperature source Oral, resp. rate (!) 29, height 6\' 1"  (1.854 m), weight 102.3 kg, SpO2 93%.   Exam: Gen Exam:Alert awake-not in any distress HEENT:atraumatic, normocephalic Chest: B/L clear to auscultation anteriorly CVS:S1S2 regular Abdomen:soft non tender, non distended Extremities:no edema Neurology: Non focal Skin: no rash  Pertinent Labs/Radiology:    Latest Ref Rng & Units 05/15/2023   12:39 AM 05/14/2023   12:01 AM 05/13/2023    6:26 AM  CBC  WBC 4.0  - 10.5 K/uL 6.9  7.6  7.2   Hemoglobin 13.0 - 17.0 g/dL 7.2  7.7  7.3   Hematocrit 39.0 - 52.0 % 22.7  23.1  21.8   Platelets 150 - 400 K/uL 92  93  94     Lab Results  Component Value Date   NA 133 (L) 05/15/2023   K 3.0 (L) 05/15/2023   CL 100 05/15/2023   CO2 22 05/15/2023     Assessment/Plan: 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 Renal function continues to worsen  Nephrology continues to follow closely-no emergent indications for HD-but suspect may be progressing towards needing HD.  Monitor closely Continue to follow renal function-avoid nephrotoxic agents.  Hypokalemia Had severe hypokalemia when he first presented-has been aggressively repleted over the past several days Mildly hypokalemic this time-continue to replete.   Mg stable.  Rhabdomyolysis Likely to be being down for the past several days at home CK downtrending Continue to trend CK  Normocytic anemia Etiology unclear-probably some element of AKI/acute illness contributing-could have had a transient GI bleed at home (history of cirrhosis)-however had had brown stools on 8/8-do not think any active ongoing bleeding at this point Hb better after 2 units of PRBC transfusion PPI Rocephin for SBP prophylaxis GI following with potential endoscopic evaluation at some point during this hospitalization.  Acute metabolic encephalopathy Likely due to AKI Seems to have improved significantly. Neuroimaging negative Ammonia stable on 8/6  Spontaneous bacterial peritonitis Had a benign  abdominal exam-minimal ascites by physical exam S/p paracentesis on 8/8-consistent with SBP On IV Rocephin Follow cultures-but negative so far  History of alcoholic liver cirrhosis Supportive  care Unclear if he still consumes alcohol Diuretics on hold Continue lactulose  HTN BP stable Losartan/Cardizem/metoprolol/diuretics remain on hold  PAF Sinus rhythm Amiodarone Eliquis on hold due to severity anemia-concern for GI bleed Telemetry monitoring  HLD Hold statin given rhabdo  OSA CPAP nightly when mentation better  Depression Hold Zoloft/lorazepam for now  Debility/deconditioning PT/OT/SLP eval May require SNF  BMI: Estimated body mass index is 29.76 kg/m as calculated from the following:   Height as of this encounter: 6\' 1"  (1.854 m).   Weight as of this encounter: 102.3 kg.   Code status:   Code Status: Full Code   DVT Prophylaxis: SCDs Start: 05/12/23 1427    Family Communication: Son-[phone]-updated 8/9   Disposition Plan: Status is: Inpatient Remains inpatient appropriate because: Severity of illness   Planned Discharge Destination:Skilled nursing facility-when medically stable-currently not ready for discharge.   Diet: Diet Order             Diet clear liquid Room service appropriate? Yes with Assist; Fluid consistency: Thin  Diet effective now                     Antimicrobial agents: Anti-infectives (From admission, onward)    Start     Dose/Rate Route Frequency Ordered Stop   05/15/23 1100  cefTRIAXone (ROCEPHIN) 2 g in sodium chloride 0.9 % 100 mL IVPB        2 g 200 mL/hr over 30 Minutes Intravenous Every 24 hours 05/14/23 1513     05/13/23 1100  cefTRIAXone (ROCEPHIN) 1 g in sodium chloride 0.9 % 100 mL IVPB  Status:  Discontinued        1 g 200 mL/hr over 30 Minutes Intravenous Every 24 hours 05/13/23 0949 05/14/23 1513        MEDICATIONS: Scheduled Meds:  amiodarone  200 mg Oral Daily   midodrine  10 mg Oral BID WC   octreotide  200 mcg Subcutaneous Q8H   pantoprazole  40 mg Oral BID   sodium chloride flush  3 mL Intravenous Q12H   Continuous Infusions:  albumin human 25 g (05/15/23 0812)    cefTRIAXone (ROCEPHIN)  IV 2 g (05/15/23 1123)   PRN Meds:.acetaminophen **OR** acetaminophen   I have personally reviewed following labs and imaging studies  LABORATORY DATA: CBC: Recent Labs  Lab 05/12/23 1257 05/13/23 0055 05/13/23 0626 05/14/23 0001 05/15/23 0039  WBC 7.3 7.1 7.2 7.6 6.9  NEUTROABS 5.3  --   --   --   --   HGB 5.8* 7.0* 7.3* 7.7* 7.2*  HCT 17.8* 20.8* 21.8* 23.1* 22.7*  MCV 85.2 82.5 83.8 84.9 85.7  PLT 104* 98* 94* 93* 92*    Basic Metabolic Panel: Recent Labs  Lab 05/12/23 1602 05/12/23 1745 05/13/23 0626 05/13/23 0957 05/13/23 1546 05/14/23 0001 05/14/23 0847 05/14/23 1637 05/15/23 0039  NA  --    < > 134*   < > 135 135 134* 135 133*  K  --    < > 2.9*   < > 3.0* 2.5* 2.8* 2.9* 3.0*  CL  --    < > 91*   < > 96* 97* 99 100 100  CO2  --    < > 25   < > 23 23 22  21* 22  GLUCOSE  --    < >  124*   < > 150* 143* 175* 132* 114*  BUN  --    < > 70*   < > 71* 70* 72* 71* 71*  CREATININE  --    < > 9.28*   < > 9.46* 9.57* 9.85* 9.99* 10.02*  CALCIUM  --    < > 6.9*   < > 6.7* 7.1* 7.0* 7.2* 7.5*  MG 2.2  --  2.0  --   --  2.1  --   --  2.1  PHOS 5.2*  --  5.9*  --   --  5.5*  --   --   --    < > = values in this interval not displayed.    GFR: Estimated Creatinine Clearance: 8.1 mL/min (A) (by C-G formula based on SCr of 10.02 mg/dL (H)).  Liver Function Tests: Recent Labs  Lab 05/12/23 1159 05/14/23 0001 05/15/23 0039  AST 123* 76* 46*  ALT 38 38 30  ALKPHOS 50 52 48  BILITOT 2.1* 1.9* 1.9*  PROT 5.4* 5.6* 5.5*  ALBUMIN 2.7* 2.7* 3.0*   No results for input(s): "LIPASE", "AMYLASE" in the last 168 hours. Recent Labs  Lab 05/12/23 1159  AMMONIA 25    Coagulation Profile: Recent Labs  Lab 05/13/23 1002  INR 1.6*    Cardiac Enzymes: Recent Labs  Lab 05/12/23 1159 05/13/23 0057 05/14/23 0001 05/15/23 0039  CKTOTAL 3,606* 2,587* 1,504* 601*    BNP (last 3 results) No results for input(s): "PROBNP" in the last 8760  hours.  Lipid Profile: No results for input(s): "CHOL", "HDL", "LDLCALC", "TRIG", "CHOLHDL", "LDLDIRECT" in the last 72 hours.  Thyroid Function Tests: No results for input(s): "TSH", "T4TOTAL", "FREET4", "T3FREE", "THYROIDAB" in the last 72 hours.  Anemia Panel: No results for input(s): "VITAMINB12", "FOLATE", "FERRITIN", "TIBC", "IRON", "RETICCTPCT" in the last 72 hours.  Urine analysis:    Component Value Date/Time   COLORURINE YELLOW 05/13/2023 1522   APPEARANCEUR HAZY (A) 05/13/2023 1522   LABSPEC 1.011 05/13/2023 1522   PHURINE 5.0 05/13/2023 1522   GLUCOSEU NEGATIVE 05/13/2023 1522   HGBUR LARGE (A) 05/13/2023 1522   BILIRUBINUR NEGATIVE 05/13/2023 1522   KETONESUR NEGATIVE 05/13/2023 1522   PROTEINUR 30 (A) 05/13/2023 1522   UROBILINOGEN 0.2 05/05/2014 1114   NITRITE NEGATIVE 05/13/2023 1522   LEUKOCYTESUR NEGATIVE 05/13/2023 1522    Sepsis Labs: Lactic Acid, Venous    Component Value Date/Time   LATICACIDVEN 2.4 (HH) 06/22/2021 1519    MICROBIOLOGY: Recent Results (from the past 240 hour(s))  Gram stain     Status: None   Collection Time: 05/14/23 11:52 AM   Specimen: Abdomen; Peritoneal Fluid  Result Value Ref Range Status   Specimen Description PERITONEAL  Final   Special Requests NONE  Final   Gram Stain   Final    FEW WBC PRESENT, PREDOMINANTLY PMN NO ORGANISMS SEEN Performed at [facility], [address], [place], [place] [address]    Report Status 05/14/2023 FINAL  Final  Culture, body fluid w Gram Stain-bottle     Status: None (Preliminary result)   Collection Time: 05/14/23 11:52 AM   Specimen: Peritoneal Washings  Result Value Ref Range Status   Specimen Description PERITONEAL  Final   Special Requests NONE  Final   Culture   Final    NO GROWTH < 24 HOURS Performed at [facility], [address], [place], [place] [address]    Report Status PENDING  Incomplete  RADIOLOGY STUDIES/RESULTS: IR Paracentesis  Result Date:  05/14/2023 INDICATION: Patient with cirrhosis with portal hypertension with history of hepatic encephalopathy. Request received for diagnostic paracentesis only with 100 mL maximum. EXAM: ULTRASOUND GUIDED right lower quadrant PARACENTESIS MEDICATIONS: 5 cc 1% lidocaine COMPLICATIONS: None immediate. PROCEDURE: Informed written consent was obtained from the patient after a discussion of the risks, benefits and alternatives to treatment. A timeout was performed prior to the initiation of the procedure. Initial ultrasound scanning demonstrates a small amount of ascites within the right lower abdominal quadrant. The right lower abdomen was prepped and draped in the usual sterile fashion. 1% lidocaine was used for local anesthesia. Following this, a 19 gauge, 7-cm, Yueh catheter was introduced. An ultrasound image was saved for documentation purposes. The paracentesis was performed. The catheter was removed and a dressing was applied. The patient tolerated the procedure well without immediate post procedural complication. FINDINGS: A total of approximately 100 mL of clear yellow fluid was removed. Samples were sent to the laboratory as requested by the clinical team. IMPRESSION: Successful ultrasound-guided paracentesis yielding 100 mL of peritoneal fluid. PLAN: If the patient eventually requires >/=2 paracenteses in a 30 day period, candidacy for formal evaluation by the [facility] Interventional Radiology Portal Hypertension Clinic will be assessed. Procedure performed by Mina Marble, PA-C Electronically Signed   By: Acquanetta Belling M.D.   On: 05/14/2023 12:26     LOS: 3 days   Jeoffrey Massed, MD  [facility]    To contact the attending provider between 7A-7P or the covering provider during after hours 7P-7A, please log into the web site [email or web address] and access using universal [place] password for that web site. If you do not have the password, please call the hospital operator.  05/15/2023, 12:43  PM

## 2023-05-15 NOTE — Progress Notes (Signed)
Subjective:  seems definitely  more oriented-  working with PT and OT and they note improvement as well -  UOP at least 500 but BUN and crt not impoving.  Diagnosed with SBP yesterday-  started on higher dose abx-  question was raised by GI for HRS ?  So albumin, midodrine and octreotide started   Objective Vital signs in last 24 hours: Vitals:   05/15/23 0421 05/15/23 0600 05/15/23 0800 05/15/23 0855  BP: 123/64  133/62 121/62  Pulse: 73  74 72  Resp: 16  (!) 27 (!) 24  Temp: 98.1 F (36.7 C) 98.2 F (36.8 C) 98.2 F (36.8 C) 98.2 F (36.8 C)  TempSrc: Oral Oral Oral Oral  SpO2: 96%  95% 94%  Weight: 102.3 kg     Height:       Weight change:   Intake/Output Summary (Last 24 hours) at 05/15/2023 0942 Last data filed at 05/15/2023 0500 Gross per 24 hour  Intake 725 ml  Output 500 ml  Net 225 ml    Assessment/Plan: 77 year old WM with cirrhosis , HFrEF, afib on appropriate meds-  also stage 3 CKD- and solitary functioning kidney  now presents found down with AKI 1.Renal- baseline CKD-  crt 1.4-1.6-  last checked in May of 24.  Now with A on CRF in the setting of being found down, rhabdo, hypotension on ARB.  Renal US showed solitary functioning kidney without hydro-  urine pretty bland -  s/p  aggressive hydration- CK down ,   holding ARB- no absolute indications for RRT-  not great that crt has not improved-  possibly established ATN but making urine now so that is better.  edematous -  IVF stopped.  Continue to follow for improvement.  Now in the differential is HRS so has been started on appropriate treatment for that.  Still not absolute need for HD and clinically has improved-  will just continue to observe 2. Hypertension/volume  - dry and hypotensive- aggressive hydration- tank now appears full-  stop  ivf 3. Hypokalemia-  aggressive repletion of this as well -  got another 40 PO today for 3.0    Mag is OK  4. Anemia  - is pretty low to be CKD related-  suspecting a GIB-  transfused  2 units on 8/6-  did have appropriate bump-  GI had started octreotide and lactulose      A     Labs: Basic Metabolic Panel: Recent Labs  Lab 05/12/23 1602 05/12/23 1745 05/13/23 0626 05/13/23 0957 05/14/23 0001 05/14/23 0847 05/14/23 1637 05/15/23 0039  NA  --    < > 134*   < > 135 134* 135 133*  K  --    < > 2.9*   < > 2.5* 2.8* 2.9* 3.0*  CL  --    < > 91*   < > 97* 99 100 100  CO2  --    < > 25   < > 23 22 21* 22  GLUCOSE  --    < > 124*   < > 143* 175* 132* 114*  BUN  --    < > 70*   < > 70* 72* 71* 71*  CREATININE  --    < > 9.28*   < > 9.57* 9.85* 9.99* 10.02*  CALCIUM  --    < > 6.9*   < > 7.1* 7.0* 7.2* 7.5*  PHOS 5.2*  --  5.9*  --  5.5*  --   --   --    < > =  values in this interval not displayed.   Liver Function Tests: Recent Labs  Lab 05/12/23 1159 05/14/23 0001 05/15/23 0039  AST 123* 76* 46*  ALT 38 38 30  ALKPHOS 50 52 48  BILITOT 2.1* 1.9* 1.9*  PROT 5.4* 5.6* 5.5*  ALBUMIN 2.7* 2.7* 3.0*   No results for input(s): "LIPASE", "AMYLASE" in the last 168 hours. Recent Labs  Lab 05/12/23 1159  AMMONIA 25   CBC: Recent Labs  Lab 05/12/23 1257 05/13/23 0055 05/13/23 0626 05/14/23 0001 05/15/23 0039  WBC 7.3 7.1 7.2 7.6 6.9  NEUTROABS 5.3  --   --   --   --   HGB 5.8* 7.0* 7.3* 7.7* 7.2*  HCT 17.8* 20.8* 21.8* 23.1* 22.7*  MCV 85.2 82.5 83.8 84.9 85.7  PLT 104* 98* 94* 93* 92*   Cardiac Enzymes: Recent Labs  Lab 05/12/23 1159 05/13/23 0057 05/14/23 0001 05/15/23 0039  CKTOTAL 3,606* 2,587* 1,504* 601*   CBG: No results for input(s): "GLUCAP" in the last 168 hours.  Iron Studies: No results for input(s): "IRON", "TIBC", "TRANSFERRIN", "FERRITIN" in the last 72 hours. Studies/Results: IR Paracentesis  Result Date: 05/14/2023 INDICATION: Patient with cirrhosis with portal hypertension with history of hepatic encephalopathy. Request received for diagnostic paracentesis only with 100 mL maximum. EXAM: ULTRASOUND  GUIDED right lower quadrant PARACENTESIS MEDICATIONS: 5 cc 1% lidocaine COMPLICATIONS: None immediate. PROCEDURE: Informed written consent was obtained from the patient after a discussion of the risks, benefits and alternatives to treatment. A timeout was performed prior to the initiation of the procedure. Initial ultrasound scanning demonstrates a small amount of ascites within the right lower abdominal quadrant. The right lower abdomen was prepped and draped in the usual sterile fashion. 1% lidocaine was used for local anesthesia. Following this, a 19 gauge, 7-cm, Yueh catheter was introduced. An ultrasound image was saved for documentation purposes. The paracentesis was performed. The catheter was removed and a dressing was applied. The patient tolerated the procedure well without immediate post procedural complication. FINDINGS: A total of approximately 100 mL of clear yellow fluid was removed. Samples were sent to the laboratory as requested by the clinical team. IMPRESSION: Successful ultrasound-guided paracentesis yielding 100 mL of peritoneal fluid. PLAN: If the patient eventually requires >/=2 paracenteses in a 30 day period, candidacy for formal evaluation by the [facility] Interventional Radiology Portal Hypertension Clinic will be assessed. Procedure performed by Mina Marble, PA-C Electronically Signed   By: Acquanetta Belling M.D.   On: 05/14/2023 12:26   Medications: Infusions:  albumin human 25 g (05/15/23 0812)   cefTRIAXone (ROCEPHIN)  IV      Scheduled Medications:  amiodarone  200 mg Oral Daily   lactulose  10 g Oral Daily   midodrine  10 mg Oral BID WC   octreotide  200 mcg Subcutaneous Q8H   pantoprazole  40 mg Oral BID   sodium chloride flush  3 mL Intravenous Q12H    have reviewed scheduled and prn medications.  Physical Exam: General: maybe a little more oriented this AM-  says has made lots of urine Heart: irreg Lungs: dec BS at bases Abdomen: soft, non tender  Extremities:  pitting edema     05/15/2023,9:42 AM  LOS: 3 days

## 2023-05-15 NOTE — Plan of Care (Signed)

## 2023-05-15 NOTE — Progress Notes (Addendum)
Daily Progress Note  DOA: 05/12/2023 Hospital Day: 4 Chief Complaint: anemia, cirrhosis  ASSESSMENT & PLAN   Brief Narrative:  Cody Philmore Josaiah Sheils. is a 77 y.o. year old male with a history of   cirrhosis, colon polyps, IBS, GERD, colon AVMs, OSA, BPH, HTN, CKD, Afib s/p DCCV   Cirrhosis with history of hepatic encephalopathy.  Admitted with altered mental status , AKI / rhabdomyolysis SBP- Dx paracentesis 8/8 -No history of esophageal varices.  -Negative HCC screening within last 6 months -MELD 3.0: 27 at 05/14/2023 12:01 AM. * High MELD driven by AKI --Continue Rocephin Q 24 hours, on day # 3.  Dose changed to 2 gm daily yesterday. --Getting IV albumin --Continue PO BID PPI  --Continue lactulose 10 grams daily. No bms documented for yesterday but patient says he is having " plenty". Will ask Nursing to document bms for Laculose titration.  --From GI standpoint can have a 2 gram sodium diet but Nephrology may have some dietary restrictions from their standpoint.     ADDENDUM: spoke with RN. He has already had 4 bms this am so will stop Lactulose.   Severe anemia / FOBT+ possibly 2/2 to portal hypertensive gastropathy  Anemia likely multifactorial (part GI blood loss and part AKI). He is up to date on colonoscopy ( July 2023) --Hgb fluctuating  but overall stable 7.2, up from 5.8 post 2 u PRBC.  --Probably an EGD at some point this admission     AKI  / Rhabomyolysis. -SBP may be contributing -Worsening Cr  -  10 today. Nephrology following.    Severe hypokalemia.  -K+ improving   A-Fib.  Home Eliquis is on hold.   -----------------------------------------------------------------------------------------------------   [facility] Attending   I have taken an interval history, reviewed the chart and examined the patient. I agree with the Advanced Practitioner's note, impression and recommendations with the following additions:  He remains seriously ill but  stable.  Hgb stable and I do not think EGD is a pressing issue unless we think we need to do therapy, so holding off given other issues right now.  I do think it will be appropriate to repeat paracentesis 5 d after the last one to check progress of SBP Tx - and check cell count  Iva Boop, MD, [facility] See Loretha Stapler on call - gastroenterology for best contact person 05/15/2023 3:52 PM   Subjective   Sleepy, easily awoken. No abdominal pain. Says he is having " plenty " of bms  Objective   New Events:  Paracentesis 8/8 >> + SBP    Recent Labs    05/13/23 0626 05/14/23 0001 05/15/23 0039  WBC 7.2 7.6 6.9  HGB 7.3* 7.7* 7.2*  HCT 21.8* 23.1* 22.7*  PLT 94* 93* 92*   BMET Recent Labs    05/14/23 0847 05/14/23 1637 05/15/23 0039  NA 134* 135 133*  K 2.8* 2.9* 3.0*  CL 99 100 100  CO2 22 21* 22  GLUCOSE 175* 132* 114*  BUN 72* 71* 71*  CREATININE 9.85* 9.99* 10.02*  CALCIUM 7.0* 7.2* 7.5*   LFT Recent Labs    05/15/23 0039  PROT 5.5*  ALBUMIN 3.0*  AST 46*  ALT 30  ALKPHOS 48  BILITOT 1.9*   PT/INR Recent Labs    05/13/23 1002  LABPROT 19.6*  INR 1.6*     Imaging:  IR Paracentesis INDICATION: Patient with cirrhosis with portal hypertension with history of hepatic encephalopathy. Request received  for diagnostic paracentesis only with 100 mL maximum.  EXAM: ULTRASOUND GUIDED right lower quadrant PARACENTESIS  MEDICATIONS: 5 cc 1% lidocaine  COMPLICATIONS: None immediate.  PROCEDURE: Informed written consent was obtained from the patient after a discussion of the risks, benefits and alternatives to treatment. A timeout was performed prior to the initiation of the procedure.  Initial ultrasound scanning demonstrates a small amount of ascites within the right lower abdominal quadrant. The right lower abdomen was prepped and draped in the usual sterile fashion. 1% lidocaine was used for local  anesthesia.  Following this, a 19 gauge, 7-cm, Yueh catheter was introduced. An ultrasound image was saved for documentation purposes. The paracentesis was performed. The catheter was removed and a dressing was applied. The patient tolerated the procedure well without immediate post procedural complication.  FINDINGS: A total of approximately 100 mL of clear yellow fluid was removed. Samples were sent to the laboratory as requested by the clinical team.  IMPRESSION: Successful ultrasound-guided paracentesis yielding 100 mL of peritoneal fluid.  PLAN: If the patient eventually requires >/=2 paracenteses in a 30 day period, candidacy for formal evaluation by the [facility] Interventional Radiology Portal Hypertension Clinic will be assessed.  Procedure performed by Mina Marble, PA-C  Electronically Signed   By: Acquanetta Belling M.D.   On: 05/14/2023 12:26     Scheduled inpatient medications:   amiodarone  200 mg Oral Daily   lactulose  10 g Oral Daily   midodrine  10 mg Oral BID WC   octreotide  200 mcg Subcutaneous Q8H   pantoprazole  40 mg Oral BID   sodium chloride flush  3 mL Intravenous Q12H   Continuous inpatient infusions:   albumin human 25 g (05/15/23 0812)   cefTRIAXone (ROCEPHIN)  IV 2 g (05/15/23 1123)   PRN inpatient medications: acetaminophen **OR** acetaminophen  Vital signs in last 24 hours: Temp:  [97.9 F (36.6 C)-98.2 F (36.8 C)] 98.2 F (36.8 C) (08/09 1120) Pulse Rate:  [65-74] 71 (08/09 1120) Resp:  [16-29] 29 (08/09 1120) BP: (113-138)/(58-64) 138/58 (08/09 1120) SpO2:  [93 %-96 %] 93 % (08/09 1120) Weight:  [102.3 kg] 102.3 kg (08/09 0421) Last BM Date : 05/14/23  Intake/Output Summary (Last 24 hours) at 05/15/2023 1153 Last data filed at 05/15/2023 0500 Gross per 24 hour  Intake 725 ml  Output 500 ml  Net 225 ml    Intake/Output from previous day: 08/08 0701 - 08/09 0700 In: 968 [P.O.:960; I.V.:8] Out: 500  [Urine:500] Intake/Output this shift: No intake/output data recorded.   Physical Exam:  General: sleepy male in NAD Heart:  Regular rate and rhythm.  Pulmonary: Normal respiratory effort Abdomen: Soft, nondistended, nontender. Normal bowel sounds. Extremities: No lower extremity edema  Neurologic: No asterixis Psych: Cooperative.  Principal Problem:   AKI (acute kidney injury) (HCC) Active Problems:   Essential hypertension   GERD (gastroesophageal reflux disease)   Fibromyalgia   Chronic lumbar pain   Major depressive disorder, recurrent episode, moderate (HCC)   Hyperlipidemia, mixed   CKD (chronic kidney disease) stage 3, GFR 30-59 ml/min (HCC)   OSA on CPAP   MCI (mild cognitive impairment)   A-fib (HCC)   Cirrhosis (HCC)   GI bleed   Hypokalemia   Uremia   Acute encephalopathy   SBP (spontaneous bacterial peritonitis) (HCC)   Decreased hemoglobin     LOS: 3 days   Willette Cluster ,NP 05/15/2023, 11:53 AM

## 2023-05-16 DIAGNOSIS — N179 Acute kidney failure, unspecified: Secondary | ICD-10-CM | POA: Diagnosis not present

## 2023-05-16 DIAGNOSIS — K7460 Unspecified cirrhosis of liver: Secondary | ICD-10-CM | POA: Diagnosis not present

## 2023-05-16 DIAGNOSIS — D649 Anemia, unspecified: Secondary | ICD-10-CM | POA: Diagnosis not present

## 2023-05-16 DIAGNOSIS — R4182 Altered mental status, unspecified: Secondary | ICD-10-CM | POA: Diagnosis not present

## 2023-05-16 LAB — RETICULOCYTES
Immature Retic Fract: 20.7 % — ABNORMAL HIGH (ref 2.3–15.9)
RBC.: 2.51 MIL/uL — ABNORMAL LOW (ref 4.22–5.81)
Retic Count, Absolute: 32.6 10*3/uL (ref 19.0–186.0)
Retic Ct Pct: 1.3 % (ref 0.4–3.1)

## 2023-05-16 LAB — FOLATE: Folate: 11.7 ng/mL (ref >5.9)

## 2023-05-16 LAB — PREPARE RBC (CROSSMATCH)

## 2023-05-16 LAB — IRON AND TIBC
Iron: 16 ug/dL — ABNORMAL LOW (ref 45–182)
Saturation Ratios: 6 % — ABNORMAL LOW (ref 17.9–39.5)
TIBC: 290 ug/dL (ref 250–450)
UIBC: 274 ug/dL

## 2023-05-16 LAB — VITAMIN B12: Vitamin B-12: 560 pg/mL (ref 180–914)

## 2023-05-16 LAB — HEMOGLOBIN AND HEMATOCRIT, BLOOD
HCT: 28.6 % — ABNORMAL LOW (ref 39.0–52.0)
Hemoglobin: 9.2 g/dL — ABNORMAL LOW (ref 13.0–17.0)

## 2023-05-16 LAB — FERRITIN: Ferritin: 28 ng/mL (ref 24–336)

## 2023-05-16 MED ORDER — SODIUM CHLORIDE 0.9% IV SOLUTION: Freq: Once | INTRAVENOUS | Status: AC

## 2023-05-16 MED ORDER — SODIUM CHLORIDE 0.9% IV SOLUTION: Freq: Once | INTRAVENOUS | Status: DC

## 2023-05-16 MED ORDER — FERROUS SULFATE 325 (65 FE) MG PO TABS
325.0000 mg | ORAL_TABLET | Freq: Two times a day (BID) | ORAL | Status: DC
  Administered 2023-05-17 – 2023-05-25 (×17): 325 mg via ORAL
  Filled 2023-05-16 (×17): qty 1

## 2023-05-16 MED ORDER — POTASSIUM CHLORIDE CRYS ER 20 MEQ PO TBCR
40.0000 meq | EXTENDED_RELEASE_TABLET | Freq: Once | ORAL | Status: AC
  Administered 2023-05-16: 40 meq via ORAL
  Filled 2023-05-16: qty 2

## 2023-05-16 MED ORDER — SODIUM CHLORIDE 0.9 % IV SOLN
250.0000 mg | Freq: Once | INTRAVENOUS | Status: AC
  Administered 2023-05-16: 250 mg via INTRAVENOUS
  Filled 2023-05-16: qty 20

## 2023-05-16 MED ORDER — FOLIC ACID 1 MG PO TABS
1.0000 mg | ORAL_TABLET | Freq: Every day | ORAL | Status: DC
  Administered 2023-05-16 – 2023-05-25 (×10): 1 mg via ORAL
  Filled 2023-05-16 (×10): qty 1

## 2023-05-16 NOTE — Plan of Care (Signed)

## 2023-05-16 NOTE — Progress Notes (Signed)
Subjective:  seems definitely  more oriented still-  tells me his son has covid so he told him to stay away -  BUN /crt trended for the better for the first time today , UOP Not completely recorded -  getting blood  Objective Vital signs in last 24 hours: Vitals:   05/16/23 0824 05/16/23 0825 05/16/23 0900 05/16/23 0915  BP:  (!) 111/59 125/61 133/66  Pulse:  69  67  Resp:   18 20  Temp: 97.7 F (36.5 C)  97.7 F (36.5 C) 97.8 F (36.6 C)  TempSrc: Oral  Oral Oral  SpO2:  94%  96%  Weight:      Height:       Weight change:   Intake/Output Summary (Last 24 hours) at 05/16/2023 1011 Last data filed at 05/15/2023 2206 Gross per 24 hour  Intake 363 ml  Output 350 ml  Net 13 ml    Assessment/Plan: 77 year old WM with cirrhosis , HFrEF, afib on appropriate meds-  also stage 3 CKD- and solitary functioning kidney  now presents found down with AKI 1.Renal- baseline CKD-  crt 1.4-1.6-  last checked in May of 24.  Now with A on CRF in the setting of being found down, rhabdo, hypotension on ARB.  Renal US showed solitary functioning kidney without hydro-  urine pretty bland -  s/p  aggressive hydration- CK down ,   holding ARB- no absolute indications for RRT-  not great that crt has not improved-  possibly established ATN but making urine now so that is better.  edematous -  IVF stopped.  Continue to follow for improvement.  Now in the differential is HRS so has been started on appropriate treatment for that with umbers trending better for the first time today.   Still not absolute need for HD and clinically has improved-  continue to observe 2. Hypertension/volume  - dry and hypotensive- aggressive hydration- tank now appears full-  stop  ivf 3. Hypokalemia-  aggressive repletion of this as well -  got another 40 PO today for 3.3    Mag is OK  4. Anemia  - is pretty low to be CKD related-  suspecting a GIB-  transfused 2 units on 8/6-  did have appropriate bump-  getting another unit today  5.  GI-  GI on board-  treating him for SBP and also giving lactulose      Cecille Aver    Labs: Basic Metabolic Panel: Recent Labs  Lab 05/13/23 0626 05/13/23 0957 05/14/23 0001 05/14/23 0847 05/14/23 1637 05/15/23 0039 05/16/23 0205  NA 134*   < > 135   < > 135 133* 136  K 2.9*   < > 2.5*   < > 2.9* 3.0* 3.3*  CL 91*   < > 97*   < > 100 100 102  CO2 25   < > 23   < > 21* 22 20*  GLUCOSE 124*   < > 143*   < > 132* 114* 125*  BUN 70*   < > 70*   < > 71* 71* 68*  CREATININE 9.28*   < > 9.57*   < > 9.99* 10.02* 9.77*  CALCIUM 6.9*   < > 7.1*   < > 7.2* 7.5* 8.1*  PHOS 5.9*  --  5.5*  --   --   --  5.5*   < > = values in this interval not displayed.   Liver Function Tests: Recent  Labs  Lab 05/12/23 1159 05/14/23 0001 05/15/23 0039 05/16/23 0205  AST 123* 76* 46*  --   ALT 38 38 30  --   ALKPHOS 50 52 48  --   BILITOT 2.1* 1.9* 1.9*  --   PROT 5.4* 5.6* 5.5*  --   ALBUMIN 2.7* 2.7* 3.0* 3.5   No results for input(s): "LIPASE", "AMYLASE" in the last 168 hours. Recent Labs  Lab 05/12/23 1159  AMMONIA 25   CBC: Recent Labs  Lab 05/12/23 1257 05/13/23 0055 05/13/23 0626 05/14/23 0001 05/15/23 0039 05/16/23 0205  WBC 7.3 7.1 7.2 7.6 6.9 6.7  NEUTROABS 5.3  --   --   --   --  4.7  HGB 5.8* 7.0* 7.3* 7.7* 7.2* 6.9*  HCT 17.8* 20.8* 21.8* 23.1* 22.7* 22.0*  MCV 85.2 82.5 83.8 84.9 85.7 86.3  PLT 104* 98* 94* 93* 92* 92*   Cardiac Enzymes: Recent Labs  Lab 05/12/23 1159 05/13/23 0057 05/14/23 0001 05/15/23 0039  CKTOTAL 3,606* 2,587* 1,504* 601*   CBG: No results for input(s): "GLUCAP" in the last 168 hours.  Iron Studies:  Recent Labs    05/16/23 0559  IRON 16*  TIBC 290  FERRITIN 28   Studies/Results: IR Paracentesis  Result Date: 05/14/2023 INDICATION: Patient with cirrhosis with portal hypertension with history of hepatic encephalopathy. Request received for diagnostic paracentesis only with 100 mL maximum. EXAM: ULTRASOUND GUIDED  right lower quadrant PARACENTESIS MEDICATIONS: 5 cc 1% lidocaine COMPLICATIONS: None immediate. PROCEDURE: Informed written consent was obtained from the patient after a discussion of the risks, benefits and alternatives to treatment. A timeout was performed prior to the initiation of the procedure. Initial ultrasound scanning demonstrates a small amount of ascites within the right lower abdominal quadrant. The right lower abdomen was prepped and draped in the usual sterile fashion. 1% lidocaine was used for local anesthesia. Following this, a 19 gauge, 7-cm, Yueh catheter was introduced. An ultrasound image was saved for documentation purposes. The paracentesis was performed. The catheter was removed and a dressing was applied. The patient tolerated the procedure well without immediate post procedural complication. FINDINGS: A total of approximately 100 mL of clear yellow fluid was removed. Samples were sent to the laboratory as requested by the clinical team. IMPRESSION: Successful ultrasound-guided paracentesis yielding 100 mL of peritoneal fluid. PLAN: If the patient eventually requires >/=2 paracenteses in a 30 day period, candidacy for formal evaluation by the [facility] Interventional Radiology Portal Hypertension Clinic will be assessed. Procedure performed by Mina Marble, PA-C Electronically Signed   By: Acquanetta Belling M.D.   On: 05/14/2023 12:26   Medications: Infusions:  cefTRIAXone (ROCEPHIN)  IV 2 g (05/15/23 1123)    Scheduled Medications:  sodium chloride   Intravenous Once   amiodarone  200 mg Oral Daily   [START ON 05/17/2023] ferrous sulfate  325 mg Oral BID WC   folic acid  1 mg Oral Daily   midodrine  10 mg Oral BID WC   octreotide  200 mcg Subcutaneous Q8H   pantoprazole  40 mg Oral BID   sodium chloride flush  3 mL Intravenous Q12H    have reviewed scheduled and prn medications.  Physical Exam: General: maybe a little more oriented still this AM-   Heart: irreg Lungs: dec BS  at bases Abdomen: soft, non tender  Extremities: pitting edema     05/16/2023,10:11 AM  LOS: 4 days

## 2023-05-16 NOTE — Progress Notes (Signed)
Daily Progress Note  DOA: 05/12/2023 Hospital Day: 5 Chief Complaint: anemia, cirrhosis  ASSESSMENT & PLAN   Brief Narrative:  Cody Hines. is a 77 y.o. year old male with a history of   cirrhosis, colon polyps, IBS, GERD, colon AVMs, OSA, BPH, HTN, CKD, Afib s/p DCCV   Cirrhosis with history of hepatic encephalopathy.  Admitted with altered mental status , AKI / rhabdomyolysis SBP- Dx paracentesis 8/8 -No history of esophageal varices.  -Negative HCC screening within last 6 months -MELD 3.0: 27 at 05/14/2023 12:01 AM. * High MELD driven by AKI --Continue Rocephin Q 24 hours, on day # 3.  Dose changed to 2 gm daily yesterday. --Getting IV albumin --Continue PO BID PPI  - mental status much better, lactulose stopped due to diarrhea and lack of HE signs - would repeat paracentesis and check cell count 8/13  Severe anemia / FOBT+ possibly 2/2 to portal hypertensive gastropathy  Anemia likely multifactorial (part GI blood loss and part AKI). He is up to date on colonoscopy ( July 2023) --Hgb fluctuating  but overall stable 7.2, up from 5.8 post 2 u PRBC.  Drifted to 6.9 1 U RBC infusing - still think not having major GI hemorrhage and await overall improvement for possible EGD, unless there are signs of hemorrhage and could need endotherapy    AKI  / Rhabomyolysis. -SBP may be contributing Sl better GFR  Nephrology following. Hoping to avoid dialysis which I do not think would go well   Severe hypokalemia.  -K+ improving   A-Fib.  Home Eliquis is on hold.   Iva Boop, MD, [facility] See Loretha Stapler on call - gastroenterology for best contact person 05/16/2023 10:19 AM     Uncertain GI Attending     Subjective   Sleepy, easily awoken. No abdominal pain. Knew he was getting blood  Objective   New Events:  Paracentesis 8/8 >> + SBP    Recent Labs    05/14/23 0001 05/15/23 0039 05/16/23 0205  WBC 7.6 6.9 6.7  HGB 7.7* 7.2*  6.9*  HCT 23.1* 22.7* 22.0*  PLT 93* 92* 92*   BMET Recent Labs    05/14/23 1637 05/15/23 0039 05/16/23 0205  NA 135 133* 136  K 2.9* 3.0* 3.3*  CL 100 100 102  CO2 21* 22 20*  GLUCOSE 132* 114* 125*  BUN 71* 71* 68*  CREATININE 9.99* 10.02* 9.77*  CALCIUM 7.2* 7.5* 8.1*   LFT Recent Labs    05/15/23 0039 05/16/23 0205  PROT 5.5*  --   ALBUMIN 3.0* 3.5  AST 46*  --   ALT 30  --   ALKPHOS 48  --   BILITOT 1.9*  --     Imaging:     Scheduled inpatient medications:   sodium chloride   Intravenous Once   amiodarone  200 mg Oral Daily   [START ON 05/17/2023] ferrous sulfate  325 mg Oral BID WC   folic acid  1 mg Oral Daily   midodrine  10 mg Oral BID WC   octreotide  200 mcg Subcutaneous Q8H   pantoprazole  40 mg Oral BID   sodium chloride flush  3 mL Intravenous Q12H   Continuous inpatient infusions:   cefTRIAXone (ROCEPHIN)  IV 2 g (05/15/23 1123)   PRN inpatient medications: acetaminophen **OR** acetaminophen  Vital signs in last 24 hours: Temp:  [97.6 F (36.4 C)-98.8 F (37.1 C)] 97.8 F (36.6 C) (08/10 0915)  Pulse Rate:  [67-81] 67 (08/10 0915) Resp:  [18-29] 20 (08/10 0915) BP: (111-148)/(58-66) 133/66 (08/10 0915) SpO2:  [93 %-97 %] 96 % (08/10 0915) Last BM Date : 05/15/23  Intake/Output Summary (Last 24 hours) at 05/16/2023 1016 Last data filed at 05/15/2023 2206 Gross per 24 hour  Intake 363 ml  Output 350 ml  Net 13 ml    Intake/Output from previous day: 08/09 0701 - 08/10 0700 In: 363 [P.O.:360; I.V.:3] Out: 350 [Urine:350] Intake/Output this shift: No intake/output data recorded.   Physical Exam:  General: sleepy male in NAD Heart:  Regular rate and rhythm.  Pulmonary: Normal respiratory effort Abdomen: Soft, nondistended, nontender. Normal bowel sounds. Extremities: No lower extremity edema  Neurologic: No asterixis Psych: Cooperative.

## 2023-05-16 NOTE — Progress Notes (Signed)
PROGRESS NOTE        PATIENT DETAILS Name: Cody Hines. Age: 77 y.o. Sex: male Date of Birth: October 24, 1945 Admit Date: 05/12/2023 Admitting Physician Synetta Fail, MD [id number], [name], MD  Brief Summary: Patient is a 77 y.o.  male with history of HTN, HLD, PAF on Eliquis, cirrhosis, EtOH use, depression, mild cognitive impairment who was found by significant other confused (had not heard from him for 10 days-separated from wife).  He was found to have severe anemia, AKI, rhabdomyolysis, encephalopathy and subsequently admitted to the hospitalist service.  He was provided with supportive care-including IV fluids/blood transfusion/Rocephin nephrology and gastroenterology followed closely-paracentesis on 8/8 was consistent with SBP.    Significant events: 8/6>> admit to [facility]  Significant studies: 8/6>> CT head: No acute intracranial abnormality 8/6>> CT C-spine: No fracture/dislocation 8/6>> x-ray right hip: Moderate to severe OA 8/6>> x-ray left elbow: No fracture 8/6>> renal ultrasound: Atrophic nonvisualized right kidney-normal sonographic appearance of left kidney.  Significant microbiology data: 8/8>> peritoneal fluid culture: No growth  Procedures: 8/8>> paracentesis (WBC 1339 with 79% neutrophils)  Consults: Nephrology GI  Subjective:  Patient in bed, appears comfortable, denies any headache, no fever, no chest pain or pressure, no shortness of breath , no abdominal pain. No new focal weakness.   Objective: Vitals: Blood pressure 133/66, pulse 67, temperature 97.8 F (36.6 C), temperature source Oral, resp. rate 20, height 6\' 1"  (1.854 m), weight 102.3 kg, SpO2 96%.   Exam:  Awake Alert, No new F.N deficits, Normal affect [place].AT,PERRAL Supple Neck, No JVD,   Symmetrical Chest wall movement, Good air movement bilaterally, CTAB RRR,No Gallops, Rubs or new Murmurs,  +ve B.Sounds, Abd Soft, No tenderness,   No Cyanosis,  Clubbing or edema    Assessment/Plan:  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 Renal function continues to worsen  Nephrology allowing, has 1-2+ edema now, have discussed this with nephrology they will evaluate the patient and decide on diuretics on 05/16/2023.  Hypokalemia Had severe hypokalemia when he first presented-has been aggressively repleted over the past several days Mildly hypokalemic this time-continue to replete.   Mg stable.  Rhabdomyolysis Likely to be being down for the past several days at home CK downtrending Continue to trend CK  Normocytic anemia Etiology unclear-probably some element of AKI/acute illness contributing-could have had a transient GI bleed at home (history of cirrhosis)-does have heme positive stools and GI following, contemplating EGD later this admission has received 2 units of packed RBC earlier this admission getting another 2 units on 05/16/2023.  Continue to monitor.  Acute metabolic encephalopathy Likely due to AKI Seems to have improved significantly. Neuroimaging negative Ammonia stable on 8/6  Spontaneous bacterial peritonitis Had a benign abdominal exam-minimal ascites by physical exam S/p paracentesis on 8/8-consistent with SBP On IV Rocephin Follow cultures-but negative so far, GI following  History of alcoholic liver cirrhosis Supportive care Unclear if he still consumes alcohol Diuretics on hold Continue lactulose  HTN BP stable Losartan/Cardizem/metoprolol/diuretics remain on hold  PAF Sinus rhythm Amiodarone Eliquis on hold due to severity anemia-concern for GI bleed Telemetry monitoring  HLD Hold statin given rhabdo  OSA CPAP nightly when mentation better  Depression  Hold  Zoloft/lorazepam for now  Debility/deconditioning PT/OT/SLP eval May require SNF  BMI: Estimated body mass index is 29.76 kg/m as calculated from the following:   Height as of this encounter: 6\' 1"  (1.854 m).   Weight as of this encounter: 102.3 kg.   Code status:   Code Status: Full Code   DVT Prophylaxis: Place and maintain sequential compression device Start: 05/16/23 0800 SCDs Start: 05/12/23 1427    Family Communication: Son-[phone]-updated 8/9   Disposition Plan: Status is: Inpatient Remains inpatient appropriate because: Severity of illness   Planned Discharge Destination:Skilled nursing facility-when medically stable-currently not ready for discharge.   Diet: Diet Order             Diet regular Room service appropriate? Yes with Assist; Fluid consistency: Thin  Diet effective now                     Antimicrobial agents: Anti-infectives (From admission, onward)    Start     Dose/Rate Route Frequency Ordered Stop   05/15/23 1100  cefTRIAXone (ROCEPHIN) 2 g in sodium chloride 0.9 % 100 mL IVPB        2 g 200 mL/hr over 30 Minutes Intravenous Every 24 hours 05/14/23 1513     05/13/23 1100  cefTRIAXone (ROCEPHIN) 1 g in sodium chloride 0.9 % 100 mL IVPB  Status:  Discontinued        1 g 200 mL/hr over 30 Minutes Intravenous Every 24 hours 05/13/23 0949 05/14/23 1513        MEDICATIONS: Scheduled Meds:  sodium chloride   Intravenous Once   amiodarone  200 mg Oral Daily   [START ON 05/17/2023] ferrous sulfate  325 mg Oral BID WC   folic acid  1 mg Oral Daily   midodrine  10 mg Oral BID WC   octreotide  200 mcg Subcutaneous Q8H   pantoprazole  40 mg Oral BID   sodium chloride flush  3 mL Intravenous Q12H   Continuous Infusions:  cefTRIAXone (ROCEPHIN)  IV 2 g (05/15/23 1123)   PRN Meds:.acetaminophen **OR** acetaminophen   I have personally reviewed following labs and imaging studies  LABORATORY DATA: CBC: Recent Labs  Lab  05/12/23 1257 05/13/23 0055 05/13/23 0626 05/14/23 0001 05/15/23 0039 05/16/23 0205  WBC 7.3 7.1 7.2 7.6 6.9 6.7  NEUTROABS 5.3  --   --   --   --  4.7  HGB 5.8* 7.0* 7.3* 7.7* 7.2* 6.9*  HCT 17.8* 20.8* 21.8* 23.1* 22.7* 22.0*  MCV 85.2 82.5 83.8 84.9 85.7 86.3  PLT 104* 98* 94* 93* 92* 92*    Basic Metabolic Panel: Recent Labs  Lab 05/12/23 1602 05/12/23 1745 05/13/23 0626 05/13/23 0957 05/14/23 0001 05/14/23 0847 05/14/23 1637 05/15/23 0039 05/16/23 0205  NA  --    < > 134*   < > 135 134* 135 133* 136  K  --    < > 2.9*   < > 2.5* 2.8* 2.9* 3.0* 3.3*  CL  --    < > 91*   < > 97* 99 100 100 102  CO2  --    < > 25   < > 23 22 21* 22 20*  GLUCOSE  --    < > 124*   < > 143* 175* 132* 114* 125*  BUN  --    < > 70*   < > 70* 72* 71* 71* 68*  CREATININE  --    < >  9.28*   < > 9.57* 9.85* 9.99* 10.02* 9.77*  CALCIUM  --    < > 6.9*   < > 7.1* 7.0* 7.2* 7.5* 8.1*  MG 2.2  --  2.0  --  2.1  --   --  2.1  --   PHOS 5.2*  --  5.9*  --  5.5*  --   --   --  5.5*   < > = values in this interval not displayed.    GFR: Estimated Creatinine Clearance: 8.3 mL/min (A) (by C-G formula based on SCr of 9.77 mg/dL (H)).  Liver Function Tests: Recent Labs  Lab 05/12/23 1159 05/14/23 0001 05/15/23 0039 05/16/23 0205  AST 123* 76* 46*  --   ALT 38 38 30  --   ALKPHOS 50 52 48  --   BILITOT 2.1* 1.9* 1.9*  --   PROT 5.4* 5.6* 5.5*  --   ALBUMIN 2.7* 2.7* 3.0* 3.5   No results for input(s): "LIPASE", "AMYLASE" in the last 168 hours. Recent Labs  Lab 05/12/23 1159  AMMONIA 25    Coagulation Profile: Recent Labs  Lab 05/13/23 1002  INR 1.6*    Cardiac Enzymes: Recent Labs  Lab 05/12/23 1159 05/13/23 0057 05/14/23 0001 05/15/23 0039  CKTOTAL 3,606* 2,587* 1,504* 601*   Anemia Panel: Recent Labs    05/16/23 0559  VITAMINB12 560  FOLATE 11.7  FERRITIN 28  TIBC 290  IRON 16*  RETICCTPCT 1.3    Urine analysis:    Component Value Date/Time   COLORURINE  YELLOW 05/13/2023 1522   APPEARANCEUR HAZY (A) 05/13/2023 1522   LABSPEC 1.011 05/13/2023 1522   PHURINE 5.0 05/13/2023 1522   GLUCOSEU NEGATIVE 05/13/2023 1522   HGBUR LARGE (A) 05/13/2023 1522   BILIRUBINUR NEGATIVE 05/13/2023 1522   KETONESUR NEGATIVE 05/13/2023 1522   PROTEINUR 30 (A) 05/13/2023 1522   UROBILINOGEN 0.2 05/05/2014 1114   NITRITE NEGATIVE 05/13/2023 1522   LEUKOCYTESUR NEGATIVE 05/13/2023 1522    Sepsis Labs: Lactic Acid, Venous    Component Value Date/Time   LATICACIDVEN 2.4 (HH) 06/22/2021 1519    MICROBIOLOGY: Recent Results (from the past 240 hour(s))  Gram stain     Status: None   Collection Time: 05/14/23 11:52 AM   Specimen: Abdomen; Peritoneal Fluid  Result Value Ref Range Status   Specimen Description PERITONEAL  Final   Special Requests NONE  Final   Gram Stain   Final    FEW WBC PRESENT, PREDOMINANTLY PMN NO ORGANISMS SEEN Performed at [facility], [address], [place], [place] [address]    Report Status 05/14/2023 FINAL  Final  Culture, body fluid w Gram Stain-bottle     Status: None (Preliminary result)   Collection Time: 05/14/23 11:52 AM   Specimen: Peritoneal Washings  Result Value Ref Range Status   Specimen Description PERITONEAL  Final   Special Requests NONE  Final   Culture   Final    NO GROWTH < 24 HOURS Performed at [facility], [address] [place], [place] [address]    Report Status PENDING  Incomplete    RADIOLOGY STUDIES/RESULTS: IR Paracentesis  Result Date: 05/14/2023 INDICATION: Patient with cirrhosis with portal hypertension with history of hepatic encephalopathy. Request received for diagnostic paracentesis only with 100 mL maximum. EXAM: ULTRASOUND GUIDED right lower quadrant PARACENTESIS MEDICATIONS: 5 cc 1% lidocaine COMPLICATIONS: None immediate. PROCEDURE: Informed written consent was obtained from the patient after a discussion of the risks, benefits and  alternatives to treatment. A timeout  was performed prior to the initiation of the procedure. Initial ultrasound scanning demonstrates a small amount of ascites within the right lower abdominal quadrant. The right lower abdomen was prepped and draped in the usual sterile fashion. 1% lidocaine was used for local anesthesia. Following this, a 19 gauge, 7-cm, Yueh catheter was introduced. An ultrasound image was saved for documentation purposes. The paracentesis was performed. The catheter was removed and a dressing was applied. The patient tolerated the procedure well without immediate post procedural complication. FINDINGS: A total of approximately 100 mL of clear yellow fluid was removed. Samples were sent to the laboratory as requested by the clinical team. IMPRESSION: Successful ultrasound-guided paracentesis yielding 100 mL of peritoneal fluid. PLAN: If the patient eventually requires >/=2 paracenteses in a 30 day period, candidacy for formal evaluation by the [facility] Interventional Radiology Portal Hypertension Clinic will be assessed. Procedure performed by Mina Marble, PA-C Electronically Signed   By: Acquanetta Belling M.D.   On: 05/14/2023 12:26     LOS: 4 days   Signature  -    Susa Raring M.D on 05/16/2023 at 9:41 AM   -  To page go to [email or web address]

## 2023-05-16 NOTE — Plan of Care (Signed)
  Problem: Education: Goal: Knowledge of General Education information will improve Description Including pain rating scale, medication(s)/side effects and non-pharmacologic comfort measures Outcome: Progressing   Problem: Health Behavior/Discharge Planning: Goal: Ability to manage health-related needs will improve Outcome: Progressing   

## 2023-05-16 NOTE — Progress Notes (Signed)
TR[facility] night cross cover note:   I was notified by RN of the patient's hemoglobin level of 6.9 this morning, relative to yesterday morning's hemoglobin level of 7.2.  He did require transfusion of 2 units PRBC on 05/12/2023.  Current vital signs appear stable with most recent blood pressure 131/62 and heart rates in the 60s to 70s.  I have subsequently ordered an updated type and screen followed by transfusion of 1 unit prbc over 3 hours, as well as a post-transfusion repeat H&H.     Newton Pigg, DO Hospitalist

## 2023-05-17 ENCOUNTER — Inpatient Hospital Stay (HOSPITAL_COMMUNITY): Payer: Medicare Other

## 2023-05-17 DIAGNOSIS — K7460 Unspecified cirrhosis of liver: Secondary | ICD-10-CM | POA: Diagnosis not present

## 2023-05-17 DIAGNOSIS — D649 Anemia, unspecified: Secondary | ICD-10-CM | POA: Diagnosis not present

## 2023-05-17 DIAGNOSIS — R4182 Altered mental status, unspecified: Secondary | ICD-10-CM | POA: Diagnosis not present

## 2023-05-17 DIAGNOSIS — N179 Acute kidney failure, unspecified: Secondary | ICD-10-CM | POA: Diagnosis not present

## 2023-05-17 DIAGNOSIS — R195 Other fecal abnormalities: Secondary | ICD-10-CM

## 2023-05-17 LAB — TYPE AND SCREEN
ABO/RH(D): A POS
Antibody Screen: NEGATIVE
Unit division: 0
Unit division: 0

## 2023-05-17 LAB — CBC WITH DIFFERENTIAL/PLATELET
Abs Immature Granulocytes: 0.03 10*3/uL (ref 0.00–0.07)
Basophils Absolute: 0 10*3/uL (ref 0.0–0.1)
Basophils Relative: 0 %
Eosinophils Absolute: 0.5 10*3/uL (ref 0.0–0.5)
Eosinophils Relative: 7 %
HCT: 28.8 % — ABNORMAL LOW (ref 39.0–52.0)
Hemoglobin: 9.1 g/dL — ABNORMAL LOW (ref 13.0–17.0)
Immature Granulocytes: 0 %
Lymphocytes Relative: 17 %
Lymphs Abs: 1.2 10*3/uL (ref 0.7–4.0)
MCH: 27.4 pg (ref 26.0–34.0)
MCHC: 31.6 g/dL (ref 30.0–36.0)
MCV: 86.7 fL (ref 80.0–100.0)
Monocytes Absolute: 0.7 10*3/uL (ref 0.1–1.0)
Monocytes Relative: 10 %
Neutro Abs: 4.5 10*3/uL (ref 1.7–7.7)
Neutrophils Relative %: 66 %
Platelets: 95 10*3/uL — ABNORMAL LOW (ref 150–400)
RBC: 3.32 MIL/uL — ABNORMAL LOW (ref 4.22–5.81)
RDW: 15.2 % (ref 11.5–15.5)
WBC: 6.9 10*3/uL (ref 4.0–10.5)
nRBC: 0 % (ref 0.0–0.2)

## 2023-05-17 LAB — BPAM RBC
Blood Product Expiration Date: 202408302359
Blood Product Expiration Date: 202408312359
ISSUE DATE / TIME: 202408100857
ISSUE DATE / TIME: 202408101104
Unit Type and Rh: 6200
Unit Type and Rh: 6200

## 2023-05-17 LAB — RENAL FUNCTION PANEL
Albumin: 3.3 g/dL — ABNORMAL LOW (ref 3.5–5.0)
Anion gap: 15 (ref 5–15)
BUN: 66 mg/dL — ABNORMAL HIGH (ref 8–23)
CO2: 21 mmol/L — ABNORMAL LOW (ref 22–32)
Calcium: 8.5 mg/dL — ABNORMAL LOW (ref 8.9–10.3)
Chloride: 100 mmol/L (ref 98–111)
Creatinine, Ser: 9.3 mg/dL — ABNORMAL HIGH (ref 0.61–1.24)
GFR, Estimated: 5 mL/min — ABNORMAL LOW (ref >60)
Glucose, Bld: 123 mg/dL — ABNORMAL HIGH (ref 70–99)
Phosphorus: 5.5 mg/dL — ABNORMAL HIGH (ref 2.5–4.6)
Potassium: 3.3 mmol/L — ABNORMAL LOW (ref 3.5–5.1)
Sodium: 136 mmol/L (ref 135–145)

## 2023-05-17 LAB — C-REACTIVE PROTEIN: CRP: 5.5 mg/dL — ABNORMAL HIGH (ref <1.0)

## 2023-05-17 LAB — BRAIN NATRIURETIC PEPTIDE: B Natriuretic Peptide: 1734.1 pg/mL — ABNORMAL HIGH (ref 0.0–100.0)

## 2023-05-17 LAB — PROCALCITONIN: Procalcitonin: 0.24 ng/mL

## 2023-05-17 MED ORDER — FUROSEMIDE 10 MG/ML IJ SOLN
40.0000 mg | Freq: Two times a day (BID) | INTRAMUSCULAR | Status: DC
  Administered 2023-05-17 – 2023-05-25 (×17): 40 mg via INTRAVENOUS
  Filled 2023-05-17 (×17): qty 4

## 2023-05-17 MED ORDER — POTASSIUM CHLORIDE CRYS ER 20 MEQ PO TBCR
40.0000 meq | EXTENDED_RELEASE_TABLET | Freq: Once | ORAL | Status: AC
  Administered 2023-05-17: 40 meq via ORAL
  Filled 2023-05-17: qty 2

## 2023-05-17 NOTE — Progress Notes (Addendum)
Daily Progress Note  DOA: 05/12/2023 Hospital Day: 6  Chief Complaint:  anemia, cirrhosis   ASSESSMENT & PLAN   Brief Narrative:  Cody Philmore Donnie Ghilardi. is a 77 y.o. year old male with a history of    cirrhosis, colon polyps, IBS, GERD, colon AVMs, OSA, BPH, HTN, CKD, Afib s/p DCCV   Cirrhosis with history of hepatic encephalopathy.  Admitted with altered mental status , AKI / rhabdomyolysis SBP- Dx paracentesis 8/8 --MELD 3.0: 27 at 05/14/2023 12:01 AM. * High MELD driven by AKI - No history of esophageal varices.  - Negative HCC screening within last 6 months --Continue Rocephin Q 24 hours, on day # 5 Initially on prophylaxis dose but changed from 1 gram to 2 grams on 8/8 --Repeat dx para on 8/13 to check for response to antibiotics --Getting IV albumin --Continue PO BID PPI  --Lactulose stopped 8/9 for excessive BMs and no HE - Looks better today. More alert  AKI  / Rhabomyolysis (on admission). Now with new dx of SBP so concern for ? HRS.  -Treated with IVF,  midodrine, Sandostatin and albumin.  -Creatinine improving slowly 10 >> 9.7 > 9.3.  -Nephrology following.   Severe anemia / FOBT+ possibly 2/2 to portal hypertensive gastropathy  Anemia likely multifactorial (part GI blood loss and part AKI). He is up to date on colonoscopy ( July 2023) --Hgb declined again yesterday to 6.9. Got 2 units of blood with improvement in hgb to 9.1.   --Received IV Fe --EGD at some point this admission when medically optimized   Severe hypokalemia.  -K+ improving >> 3.3   A-Fib.  Home Eliquis is on hold.   ---------------------------------------------------------------------------------------------------    [facility] Attending   I have taken an interval history, reviewed the chart and examined the patient. I agree with the Advanced Practitioner's note, impression and recommendations.  Iva Boop, MD, [facility] See Loretha Stapler on call -  gastroenterology for best contact person 05/17/2023 1:34 PM    Subjective   No complaints. Had solids for breakfast. Says diarrhea is better. Only one documented BM thus far this am   Objective   Recent Labs    05/15/23 0039 05/16/23 0205 05/16/23 1436 05/17/23 0244  WBC 6.9 6.7  --  6.9  HGB 7.2* 6.9* 9.2* 9.1*  HCT 22.7* 22.0* 28.6* 28.8*  PLT 92* 92*  --  95*   BMET Recent Labs    05/15/23 0039 05/16/23 0205 05/17/23 0244  NA 133* 136 136  K 3.0* 3.3* 3.3*  CL 100 102 100  CO2 22 20* 21*  GLUCOSE 114* 125* 123*  BUN 71* 68* 66*  CREATININE 10.02* 9.77* 9.30*  CALCIUM 7.5* 8.1* 8.5*   LFT Recent Labs    05/15/23 0039 05/16/23 0205 05/17/23 0244  PROT 5.5*  --   --   ALBUMIN 3.0*   < > 3.3*  AST 46*  --   --   ALT 30  --   --   ALKPHOS 48  --   --   BILITOT 1.9*  --   --    < > = values in this interval not displayed.   PT/INR No results for input(s): "LABPROT", "INR" in the last 72 hours.   Imaging:  DG Chest Port 1 View CLINICAL DATA:  77 year old male with shortness of breath. Cirrhosis, hepatic encephalopathy.  EXAM: PORTABLE CHEST 1 VIEW  COMPARISON:  Portable chest 11/04/2022.  FINDINGS: Portable AP semi upright view  at 0628 hours. Lower lung volumes. Partially obscured mediastinal contours, those visible appears stable since January. Visualized tracheal air column is within normal limits. Multifocal bilateral Patchy and confluent lung opacity, with bibasilar veiling opacity which is more pronounced on the left. The left upper lobe is relatively spared. No pneumothorax. No air bronchograms. Paucity of bowel gas in the visible abdomen. No acute osseous abnormality identified.  IMPRESSION: 1. Positive for extensive bilateral pulmonary opacity, sparing only the left upper lobe. Bilateral pleural effusions suspected, larger on the left. 2. Differential considerations include bilateral pneumonia with effusions (favored). Aspiration  felt less likely. Asymmetric pulmonary edema with atelectasis and effusions also possible.  Electronically Signed   By: Odessa Fleming M.D.   On: 05/17/2023 06:58     Scheduled inpatient medications:   sodium chloride   Intravenous Once   amiodarone  200 mg Oral Daily   ferrous sulfate  325 mg Oral BID WC   folic acid  1 mg Oral Daily   furosemide  40 mg Intravenous Q12H   midodrine  10 mg Oral BID WC   octreotide  200 mcg Subcutaneous Q8H   pantoprazole  40 mg Oral BID   sodium chloride flush  3 mL Intravenous Q12H   Continuous inpatient infusions:   cefTRIAXone (ROCEPHIN)  IV 2 g (05/17/23 1002)   PRN inpatient medications: acetaminophen **OR** acetaminophen  Vital signs in last 24 hours: Temp:  [97.6 F (36.4 C)-98.3 F (36.8 C)] 98.2 F (36.8 C) (08/11 0400) Pulse Rate:  [62-76] 64 (08/11 0400) Resp:  [19-25] 21 (08/11 0400) BP: (119-141)/(57-72) 135/58 (08/11 0400) SpO2:  [92 %-99 %] 97 % (08/11 0400) Last BM Date : 05/15/23  Intake/Output Summary (Last 24 hours) at 05/17/2023 1005 Last data filed at 05/17/2023 8416 Gross per 24 hour  Intake 853 ml  Output 950 ml  Net -97 ml    Intake/Output from previous day: 08/10 0701 - 08/11 0700 In: 853 [I.V.:3; Blood:850] Out: 500 [Urine:500] Intake/Output this shift: Total I/O In: -  Out: 450 [Urine:450]   Physical Exam:  General: Alert male in NAD Heart:  Regular rate, +  murmur Pulmonary: Normal respiratory effort Abdomen: Soft, nondistended, nontender. Normal bowel sounds. Extremities: No lower extremity edema  Neurologic: Alert and oriented Psych: Pleasant. Cooperative. Insight appears normal.    Principal Problem:   AKI (acute kidney injury) (HCC) Active Problems:   Essential hypertension   GERD (gastroesophageal reflux disease)   Fibromyalgia   Chronic lumbar pain   Major depressive disorder, recurrent episode, moderate (HCC)   Hyperlipidemia, mixed   CKD (chronic kidney disease) stage 3, GFR 30-59  ml/min (HCC)   OSA on CPAP   MCI (mild cognitive impairment)   A-fib (HCC)   Cirrhosis (HCC)   GI bleed   Hypokalemia   Uremia   Acute encephalopathy   SBP (spontaneous bacterial peritonitis) (HCC)   Decreased hemoglobin     LOS: 5 days   Willette Cluster ,NP 05/17/2023, 10:05 AM

## 2023-05-17 NOTE — Plan of Care (Signed)

## 2023-05-17 NOTE — Progress Notes (Signed)
PROGRESS NOTE        PATIENT DETAILS Name: Cody Hines. Age: 77 y.o. Sex: male Date of Birth: Jun 16, 1946 Admit Date: 05/12/2023 Admitting Physician Synetta Fail, MD [id number], [name], MD  Brief Summary: Patient is a 77 y.o.  male with history of HTN, HLD, PAF on Eliquis, cirrhosis, EtOH use, depression, mild cognitive impairment who was found by significant other confused (had not heard from him for 10 days-separated from wife).  He was found to have severe anemia, AKI, rhabdomyolysis, encephalopathy and subsequently admitted to the hospitalist service.  He was provided with supportive care-including IV fluids/blood transfusion/Rocephin nephrology and gastroenterology followed closely-paracentesis on 8/8 was consistent with SBP.    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  Significant microbiology data: 8/8>> peritoneal fluid culture: No growth  Procedures: 8/8>> paracentesis (WBC 1339 with 79% neutrophils)  Consults: Nephrology GI  Subjective:  Patient in bed, appears comfortable, denies any headache, no fever, no chest pain or pressure, no shortness of breath , no abdominal pain. No new focal weakness.   Objective: Vitals: Blood pressure (!) 135/58, pulse 64, temperature 98.2 F (36.8 C), temperature source Oral, resp. rate (!) 21, height 6\' 1"  (1.854 m), weight 102.3 kg, SpO2 97%.   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 Renal function continues to worsen  Nephrology allowing, has 1-2+ edema now, have discussed this with nephrology they will evaluate the patient and decide on diuretics on 05/16/2023, discussed again on 05/17/2023 Lasix today.  Hypokalemia Replaced  Rhabdomyolysis Likely to be being down for the past several days at home CK downtrending Continue to trend CK  Normocytic anemia Etiology unclear-probably some element of AKI/acute illness contributing-could have had a transient GI bleed at home (history of cirrhosis)-does have heme positive stools and GI following, contemplating EGD later this admission has received 2 units of packed RBC earlier this admission getting another 2 units on 05/16/2023.  Continue to monitor.  Acute metabolic encephalopathy Likely due to AKI Seems to have improved significantly. Neuroimaging negative Ammonia stable on 8/6  Spontaneous bacterial peritonitis Had a benign abdominal exam-minimal ascites by physical exam S/p paracentesis on 8/8-consistent with SBP On IV Rocephin Follow cultures-but negative so far, cytology stable, GI following  History of alcoholic liver cirrhosis Supportive care Unclear if he still consumes alcohol Diuretics on hold Continue lactulose  HTN BP stable Losartan/Cardizem/metoprolol/diuretics remain on hold  PAF Sinus rhythm Amiodarone Eliquis on hold due to severity anemia-concern for GI bleed Telemetry monitoring  HLD Hold statin given rhabdo  OSA CPAP nightly when mentation better  Depression Hold Zoloft/lorazepam for now  Debility/deconditioning PT/OT/SLP eval May require  SNF  BMI:  Estimated body mass index is 29.76 kg/m as calculated from the following:   Height as of this encounter: 6\' 1"  (1.854 m).   Weight as of this encounter: 102.3 kg.   Code status:   Code Status: Full Code   DVT Prophylaxis: Place and maintain sequential compression device Start: 05/16/23 0800 SCDs Start: 05/12/23 1427    Family Communication: Son-Mitchell-[phone]-updated 8/9   Disposition Plan: Status is: Inpatient Remains inpatient appropriate because: Severity of illness   Planned Discharge Destination:Skilled nursing facility-when medically stable-currently not ready for discharge.   Diet: Diet Order             Diet regular Room service appropriate? Yes with Assist; Fluid consistency: Thin  Diet effective now                     Antimicrobial agents: Anti-infectives (From admission, onward)    Start     Dose/Rate Route Frequency Ordered Stop   05/15/23 1100  cefTRIAXone (ROCEPHIN) 2 g in sodium chloride 0.9 % 100 mL IVPB        2 g 200 mL/hr over 30 Minutes Intravenous Every 24 hours 05/14/23 1513     05/13/23 1100  cefTRIAXone (ROCEPHIN) 1 g in sodium chloride 0.9 % 100 mL IVPB  Status:  Discontinued        1 g 200 mL/hr over 30 Minutes Intravenous Every 24 hours 05/13/23 0949 05/14/23 1513        MEDICATIONS: Scheduled Meds:  sodium chloride   Intravenous Once   amiodarone  200 mg Oral Daily   ferrous sulfate  325 mg Oral BID WC   folic acid  1 mg Oral Daily   furosemide  40 mg Intravenous Q12H   midodrine  10 mg Oral BID WC   octreotide  200 mcg Subcutaneous Q8H   pantoprazole  40 mg Oral BID   sodium chloride flush  3 mL Intravenous Q12H   Continuous Infusions:  cefTRIAXone (ROCEPHIN)  IV 2 g (05/17/23 1002)   PRN Meds:.acetaminophen **OR** acetaminophen   I have personally reviewed following labs and imaging studies  LABORATORY DATA: CBC: Recent Labs  Lab 05/12/23 1257 05/13/23 0055 05/13/23 0626 05/14/23 0001  05/15/23 0039 05/16/23 0205 05/16/23 1436 05/17/23 0244  WBC 7.3   < > 7.2 7.6 6.9 6.7  --  6.9  NEUTROABS 5.3  --   --   --   --  4.7  --  4.5  HGB 5.8*   < > 7.3* 7.7* 7.2* 6.9* 9.2* 9.1*  HCT 17.8*   < > 21.8* 23.1* 22.7* 22.0* 28.6* 28.8*  MCV 85.2   < > 83.8 84.9 85.7 86.3  --  86.7  PLT 104*   < > 94* 93* 92* 92*  --  95*   < > = values in this interval not displayed.    Basic Metabolic Panel: Recent Labs  Lab 05/12/23 1602 05/12/23 1745 05/13/23 0626 05/13/23 0957 05/14/23 0001 05/14/23 0847 05/14/23 1637 05/15/23 0039 05/16/23 0205 05/17/23 0244  NA  --    < > 134*   < > 135 134* 135 133* 136 136  K  --    < > 2.9*   < > 2.5* 2.8* 2.9* 3.0* 3.3* 3.3*  CL  --    < > 91*   < > 97* 99 100 100 102 100  CO2  --    < > 25   < > 23 22 21* 22 20* 21*  GLUCOSE  --    < > 124*   < > 143* 175* 132* 114* 125* 123*  BUN  --    < > 70*   < > 70* 72* 71* 71* 68* 66*  CREATININE  --    < > 9.28*   < > 9.57* 9.85* 9.99* 10.02* 9.77* 9.30*  CALCIUM  --    < > 6.9*   < > 7.1* 7.0* 7.2* 7.5* 8.1* 8.5*  MG 2.2  --  2.0  --  2.1  --   --  2.1  --   --   PHOS 5.2*  --  5.9*  --  5.5*  --   --   --  5.5* 5.5*   < > = values in this interval not displayed.    GFR: Estimated Creatinine Clearance: 8.8 mL/min (A) (by C-G formula based on SCr of 9.3 mg/dL (H)).  Liver Function Tests: Recent Labs  Lab 05/12/23 1159 05/14/23 0001 05/15/23 0039 05/16/23 0205 05/17/23 0244  AST 123* 76* 46*  --   --   ALT 38 38 30  --   --   ALKPHOS 50 52 48  --   --   BILITOT 2.1* 1.9* 1.9*  --   --   PROT 5.4* 5.6* 5.5*  --   --   ALBUMIN 2.7* 2.7* 3.0* 3.5 3.3*   No results for input(s): "LIPASE", "AMYLASE" in the last 168 hours. Recent Labs  Lab 05/12/23 1159  AMMONIA 25    Coagulation Profile: Recent Labs  Lab 05/13/23 1002  INR 1.6*    Cardiac Enzymes: Recent Labs  Lab 05/12/23 1159 05/13/23 0057 05/14/23 0001 05/15/23 0039  CKTOTAL 3,606* 2,587* 1,504* 601*   Anemia  Panel: Recent Labs    05/16/23 0559  VITAMINB12 560  FOLATE 11.7  FERRITIN 28  TIBC 290  IRON 16*  RETICCTPCT 1.3    Urine analysis:    Component Value Date/Time   COLORURINE YELLOW 05/13/2023 1522   APPEARANCEUR HAZY (A) 05/13/2023 1522   LABSPEC 1.011 05/13/2023 1522   PHURINE 5.0 05/13/2023 1522   GLUCOSEU NEGATIVE 05/13/2023 1522   HGBUR LARGE (A) 05/13/2023 1522   BILIRUBINUR NEGATIVE 05/13/2023 1522   KETONESUR NEGATIVE 05/13/2023 1522   PROTEINUR 30 (A) 05/13/2023 1522   UROBILINOGEN 0.2 05/05/2014 1114   NITRITE NEGATIVE 05/13/2023 1522   LEUKOCYTESUR NEGATIVE 05/13/2023 1522    Sepsis Labs: Lactic Acid, Venous    Component Value Date/Time   LATICACIDVEN 2.4 (HH) 06/22/2021 1519    MICROBIOLOGY: Recent Results (from the past 240 hour(s))  Gram stain     Status: None   Collection Time: 05/14/23 11:52 AM   Specimen: Abdomen; Peritoneal Fluid  Result Value Ref Range Status   Specimen Description PERITONEAL  Final   Special Requests NONE  Final   Gram Stain   Final    FEW WBC PRESENT, PREDOMINANTLY PMN NO ORGANISMS SEEN Performed at [facility], [address] [place], [place] [address]    Report Status 05/14/2023 FINAL  Final  Culture, body fluid w Gram Stain-bottle     Status: None (Preliminary result)   Collection Time: 05/14/23 11:52 AM   Specimen: Peritoneal Washings  Result Value Ref Range Status   Specimen Description PERITONEAL  Final   Special Requests NONE  Final   Culture   Final    NO GROWTH 2 DAYS Performed at [facility], [address], [place], [place] [address]  Report Status PENDING  Incomplete    RADIOLOGY STUDIES/RESULTS: DG Chest Port 1 View  Result Date: 05/17/2023 CLINICAL DATA:  77 year old male with shortness of breath. Cirrhosis, hepatic encephalopathy. EXAM: PORTABLE CHEST 1 VIEW COMPARISON:  Portable chest 11/04/2022. FINDINGS: Portable AP semi upright view at 0628 hours. Lower lung volumes. Partially  obscured mediastinal contours, those visible appears stable since January. Visualized tracheal air column is within normal limits. Multifocal bilateral Patchy and confluent lung opacity, with bibasilar veiling opacity which is more pronounced on the left. The left upper lobe is relatively spared. No pneumothorax. No air bronchograms. Paucity of bowel gas in the visible abdomen. No acute osseous abnormality identified. IMPRESSION: 1. Positive for extensive bilateral pulmonary opacity, sparing only the left upper lobe. Bilateral pleural effusions suspected, larger on the left. 2. Differential considerations include bilateral pneumonia with effusions (favored). Aspiration felt less likely. Asymmetric pulmonary edema with atelectasis and effusions also possible. Electronically Signed   By: Odessa Fleming M.D.   On: 05/17/2023 06:58     LOS: 5 days   Signature  -    Susa Raring M.D on 05/17/2023 at 10:46 AM   -  To page go to [email or web address]

## 2023-05-17 NOTE — Progress Notes (Signed)
Subjective:  seems definitely  more oriented still-   UOP recording again not complete-  but at least 500 and has 400 in his cannister this AM  Objective Vital signs in last 24 hours: Vitals:   05/16/23 2200 05/17/23 0000 05/17/23 0240 05/17/23 0400  BP:  138/72 (!) 121/57 (!) 135/58  Pulse: 68 62 66 64  Resp: 20 (!) 22 (!) 24 (!) 21  Temp:  97.9 F (36.6 C)  98.2 F (36.8 C)  TempSrc:  Oral  Oral  SpO2: 98% 92% 93% 97%  Weight:      Height:       Weight change:   Intake/Output Summary (Last 24 hours) at 05/17/2023 0831 Last data filed at 05/17/2023 0400 Gross per 24 hour  Intake 853 ml  Output 500 ml  Net 353 ml    Assessment/Plan: 77 year old WM with cirrhosis , HFrEF, afib on appropriate meds-  also stage 3 CKD- and solitary functioning kidney  now presents found down with AKI 1.Renal- baseline CKD-  crt 1.4-1.6-  last checked in May of 24.  Now with A on CRF in the setting of being found down, rhabdo, hypotension on ARB.  Renal US showed solitary functioning kidney without hydro-  urine pretty bland -  s/p  aggressive hydration- CK down ,   holding ARB- no absolute indications for RRT-  not great that crt has not improved-  possibly established ATN but making urine now so that is better.  edematous -  IVF stopped.  Continue to follow for improvement.  Now in the differential is HRS so has been started on appropriate treatment for that with umbers trending better for the second time today.   Still not absolute need for HD and clinically has improved-  continue to observe 2. Hypertension/volume  - dry and hypotensive- aggressive hydration- tank now appears full-  stopped  ivf-  now crt turing the corner and possibly having some volume related issues so will give some lasix 3. Hypokalemia-  aggressive repletion of this as well -  getting another 40 PO today for 3.3    Mag is OK  4. Anemia  - is pretty low to be CKD related-  suspecting a GIB-  transfused 2 units on 8/6-   another unit  8/10  5. GI-  GI on board-  treating him for SBP and also giving lactulose      Cody Hines    Labs: Basic Metabolic Panel: Recent Labs  Lab 05/14/23 0001 05/14/23 0847 05/15/23 0039 05/16/23 0205 05/17/23 0244  NA 135   < > 133* 136 136  K 2.5*   < > 3.0* 3.3* 3.3*  CL 97*   < > 100 102 100  CO2 23   < > 22 20* 21*  GLUCOSE 143*   < > 114* 125* 123*  BUN 70*   < > 71* 68* 66*  CREATININE 9.57*   < > 10.02* 9.77* 9.30*  CALCIUM 7.1*   < > 7.5* 8.1* 8.5*  PHOS 5.5*  --   --  5.5* 5.5*   < > = values in this interval not displayed.   Liver Function Tests: Recent Labs  Lab 05/12/23 1159 05/14/23 0001 05/15/23 0039 05/16/23 0205 05/17/23 0244  AST 123* 76* 46*  --   --   ALT 38 38 30  --   --   ALKPHOS 50 52 48  --   --   BILITOT 2.1* 1.9* 1.9*  --   --  PROT 5.4* 5.6* 5.5*  --   --   ALBUMIN 2.7* 2.7* 3.0* 3.5 3.3*   No results for input(s): "LIPASE", "AMYLASE" in the last 168 hours. Recent Labs  Lab 05/12/23 1159  AMMONIA 25   CBC: Recent Labs  Lab 05/12/23 1257 05/13/23 0055 05/13/23 0626 05/14/23 0001 05/15/23 0039 05/16/23 0205 05/16/23 1436 05/17/23 0244  WBC 7.3   < > 7.2 7.6 6.9 6.7  --  6.9  NEUTROABS 5.3  --   --   --   --  4.7  --  4.5  HGB 5.8*   < > 7.3* 7.7* 7.2* 6.9* 9.2* 9.1*  HCT 17.8*   < > 21.8* 23.1* 22.7* 22.0* 28.6* 28.8*  MCV 85.2   < > 83.8 84.9 85.7 86.3  --  86.7  PLT 104*   < > 94* 93* 92* 92*  --  95*   < > = values in this interval not displayed.   Cardiac Enzymes: Recent Labs  Lab 05/12/23 1159 05/13/23 0057 05/14/23 0001 05/15/23 0039  CKTOTAL 3,606* 2,587* 1,504* 601*   CBG: No results for input(s): "GLUCAP" in the last 168 hours.  Iron Studies:  Recent Labs    05/16/23 0559  IRON 16*  TIBC 290  FERRITIN 28   Studies/Results: DG Chest Port 1 View  Result Date: 05/17/2023 CLINICAL DATA:  77 year old male with shortness of breath. Cirrhosis, hepatic encephalopathy. EXAM: PORTABLE CHEST 1  VIEW COMPARISON:  Portable chest 11/04/2022. FINDINGS: Portable AP semi upright view at 0628 hours. Lower lung volumes. Partially obscured mediastinal contours, those visible appears stable since January. Visualized tracheal air column is within normal limits. Multifocal bilateral Patchy and confluent lung opacity, with bibasilar veiling opacity which is more pronounced on the left. The left upper lobe is relatively spared. No pneumothorax. No air bronchograms. Paucity of bowel gas in the visible abdomen. No acute osseous abnormality identified. IMPRESSION: 1. Positive for extensive bilateral pulmonary opacity, sparing only the left upper lobe. Bilateral pleural effusions suspected, larger on the left. 2. Differential considerations include bilateral pneumonia with effusions (favored). Aspiration felt less likely. Asymmetric pulmonary edema with atelectasis and effusions also possible. Electronically Signed   By: Odessa Fleming M.D.   On: 05/17/2023 06:58   Medications: Infusions:  cefTRIAXone (ROCEPHIN)  IV 2 g (05/16/23 1047)    Scheduled Medications:  sodium chloride   Intravenous Once   amiodarone  200 mg Oral Daily   ferrous sulfate  325 mg Oral BID WC   folic acid  1 mg Oral Daily   midodrine  10 mg Oral BID WC   octreotide  200 mcg Subcutaneous Q8H   pantoprazole  40 mg Oral BID   sodium chloride flush  3 mL Intravenous Q12H    have reviewed scheduled and prn medications.  Physical Exam: General: maybe a little more oriented still this AM-   Heart: irreg Lungs: dec BS at bases Abdomen: soft, non tender  Extremities: pitting edema     05/17/2023,8:31 AM  LOS: 5 days

## 2023-05-17 NOTE — Plan of Care (Signed)

## 2023-05-18 DIAGNOSIS — K7460 Unspecified cirrhosis of liver: Secondary | ICD-10-CM | POA: Diagnosis not present

## 2023-05-18 DIAGNOSIS — N179 Acute kidney failure, unspecified: Secondary | ICD-10-CM | POA: Diagnosis not present

## 2023-05-18 DIAGNOSIS — K652 Spontaneous bacterial peritonitis: Secondary | ICD-10-CM | POA: Diagnosis not present

## 2023-05-18 DIAGNOSIS — D649 Anemia, unspecified: Secondary | ICD-10-CM | POA: Diagnosis not present

## 2023-05-18 MED ORDER — POTASSIUM CHLORIDE CRYS ER 20 MEQ PO TBCR
40.0000 meq | EXTENDED_RELEASE_TABLET | Freq: Once | ORAL | Status: AC
  Administered 2023-05-18: 40 meq via ORAL
  Filled 2023-05-18: qty 2

## 2023-05-18 MED ORDER — NALOXONE HCL 0.4 MG/ML IJ SOLN: 0.4000 mg | INTRAMUSCULAR | Status: DC | PRN

## 2023-05-18 MED ORDER — HYDROMORPHONE HCL 1 MG/ML IJ SOLN
0.5000 mg | Freq: Once | INTRAMUSCULAR | Status: AC | PRN
  Administered 2023-05-18: 0.5 mg via INTRAVENOUS
  Filled 2023-05-18: qty 0.5

## 2023-05-18 NOTE — Progress Notes (Signed)
[facility] night cross cover note:   I was notified by RN that the patient is complaining of crampy abdominal discomfort refractory to as needed Tylenol.  He also received a dose of Protonix tonight, and is requesting stronger analgesic intervention for his abdominal pain.  With his renal function, will refrain from nsaids. I subsequently ordered a one time prn dose of 0.5 mg IV dilaudid for abd pain.     Cody Pigg, DO Hospitalist

## 2023-05-18 NOTE — Plan of Care (Signed)
  Problem: Education: Goal: Knowledge of General Education information will improve Description: Including pain rating scale, medication(s)/side effects and non-pharmacologic comfort measures 05/18/2023 0208 by Bonnita Levan, RN Outcome: Progressing 05/18/2023 0208 by Bonnita Levan, RN Outcome: Progressing   Problem: Health Behavior/Discharge Planning: Goal: Ability to manage health-related needs will improve 05/18/2023 0208 by Bonnita Levan, RN Outcome: Progressing 05/18/2023 0208 by Bonnita Levan, RN Outcome: Progressing   Problem: Clinical Measurements: Goal: Ability to maintain clinical measurements within normal limits will improve Outcome: Progressing Goal: Will remain free from infection Outcome: Progressing Goal: Diagnostic test results will improve Outcome: Progressing Goal: Respiratory complications will improve Outcome: Progressing Goal: Cardiovascular complication will be avoided Outcome: Progressing   Problem: Activity: Goal: Risk for activity intolerance will decrease Outcome: Progressing   Problem: Nutrition: Goal: Adequate nutrition will be maintained Outcome: Progressing   Problem: Coping: Goal: Level of anxiety will decrease Outcome: Progressing   Problem: Elimination: Goal: Will not experience complications related to bowel motility Outcome: Progressing Goal: Will not experience complications related to urinary retention Outcome: Progressing   Problem: Pain Managment: Goal: General experience of comfort will improve Outcome: Progressing   Problem: Safety: Goal: Ability to remain free from injury will improve Outcome: Progressing   Problem: Skin Integrity: Goal: Risk for impaired skin integrity will decrease Outcome: Progressing   Problem: Education: Goal: Ability to identify signs and symptoms of gastrointestinal bleeding will improve Outcome: Progressing   Problem: Bowel/Gastric: Goal: Will show no signs and symptoms of gastrointestinal  bleeding Outcome: Progressing   Problem: Fluid Volume: Goal: Will show no signs and symptoms of excessive bleeding Outcome: Progressing   Problem: Clinical Measurements: Goal: Complications related to the disease process, condition or treatment will be avoided or minimized Outcome: Progressing

## 2023-05-18 NOTE — Progress Notes (Signed)
Physical Therapy Treatment Patient Details Name: Cody Hines. MRN: [id number] DOB: 02/11/1946 Today's Date: 05/18/2023   History of Present Illness 77 yo male presenting to ED 8/6 with AMS. Found by ex-wife confused in his bed, incontinent of stool. CTH negative. Rt hip and Lt elbow xrays negative. +AKI, dehydration, hypotension, hypokalemia, Hgb 5.8 (transfused 2 units)  PMH  significant of HTN, OSA on CPAP, MCI, multiple spinal compression fxs, CKD, afib, cirrhosis, fibromyalgia, mild cognitive impairment    PT Comments  Currently pt is Mod A for bed mobility and sit to stand. He was able to progress gait this session with very low foot clearance of the LLE with AD, locking the knees bil and very short steps requiring wgt shifting and multi modal cues from physical therapist. Pt is currently at a very high risk for falls. Due to pt current functional status, home set up and available assistance at home recommending skilled physical therapy services at a higher level of care and frequency (5x/weekly) on discharge from acute care hospital setting in order to decrease risk for falls, immobility, skin breakdown, injury and re-hospitalization.     If plan is discharge home, recommend the following: A little help with walking and/or transfers;Help with stairs or ramp for entrance;Assistance with cooking/housework;Assist for transportation   Can travel by private vehicle     No  Equipment Recommendations  None recommended by PT    Recommendations for Other Services       Precautions / Restrictions Precautions Precautions: Fall Precaution Comments: pt reports he doesn't remember falling, but he was told he did Restrictions Weight Bearing Restrictions: No     Mobility  Bed Mobility Overal bed mobility: Needs Assistance Bed Mobility: Rolling, Supine to Sit Rolling: Min assist, Used rails   Supine to sit: Mod assist     General bed mobility comments: Pt left sitting in  recliner at end of session. Pt is Mod A with cues for sequencing for supine to sitting EOB Patient Response: Cooperative  Transfers Overall transfer level: Needs assistance Equipment used: Rolling walker (2 wheels) Transfers: Sit to/from Stand Sit to Stand: Mod assist, Min assist           General transfer comment: Pt requires verbal cues for safe hand placement. Performed 3x during session. Mod A initial momentum to get to standing from lower surface and Min A from elevated surface. Pt was able to stand for ~5 min prior to tremors from weakness in UE and LE    Ambulation/Gait Ambulation/Gait assistance: Mod assist Gait Distance (Feet): 4 Feet (3x) Assistive device: Rolling walker (2 wheels) Gait Pattern/deviations: Step-to pattern, Step-through pattern, Decreased step length - right, Decreased step length - left, Decreased dorsiflexion - left, Decreased dorsiflexion - right Gait velocity: Decreased cadence. Gait velocity interpretation: <1.31 ft/sec, indicative of household ambulator   General Gait Details: Locked knees with step-to to partial step through gait pattern. Stepping fwd/back with rest breaks between. Very low foot clearance on the L     Tilt Bed Tilt Bed Patient Response: Cooperative       Balance Overall balance assessment: Needs assistance Sitting-balance support: Bilateral upper extremity supported, Feet supported, Feet unsupported Sitting balance-Leahy Scale: Fair     Standing balance support: Bilateral upper extremity supported, During functional activity, Reliant on assistive device for balance Standing balance-Leahy Scale: Poor Standing balance comment: Pt has poor standing balance with heavy posterior lean initially with Mod A to maintain upright posture.  Cognition Arousal: Alert Behavior During Therapy: WFL for tasks assessed/performed Overall Cognitive Status: No family/caregiver present to determine baseline cognitive functioning              General Comments General comments (skin integrity, edema, etc.): Pt provided with pericare for BM upon arrival      Pertinent Vitals/Pain Pain Assessment Pain Assessment: Faces Faces Pain Scale: Hurts even more Pain Location: back and stomach Pain Descriptors / Indicators: Aching, Discomfort, Grimacing, Guarding Pain Intervention(s): Monitored during session, Limited activity within patient's tolerance     PT Goals (current goals can now be found in the care plan section) Acute Rehab PT Goals Patient Stated Goal: to be able to return home PT Goal Formulation: With patient Time For Goal Achievement: 05/28/23 Potential to Achieve Goals: Good Progress towards PT goals: Progressing toward goals    Frequency    Min 1X/week      PT Plan  Continue with current POC       AM-PAC PT "6 Clicks" Mobility   Outcome Measure  Help needed turning from your back to your side while in a flat bed without using bedrails?: A Little Help needed moving from lying on your back to sitting on the side of a flat bed without using bedrails?: A Lot Help needed moving to and from a bed to a chair (including a wheelchair)?: A Lot Help needed standing up from a chair using your arms (e.g., wheelchair or bedside chair)?: A Lot Help needed to walk in hospital room?: Total Help needed climbing 3-5 steps with a railing? : Total 6 Click Score: 11    End of Session Equipment Utilized During Treatment: Oxygen Activity Tolerance: Patient tolerated treatment well Patient left: in bed;with call bell/phone within reach;with bed alarm set Nurse Communication: Mobility status PT Visit Diagnosis: History of falling (Z91.81);Muscle weakness (generalized) (M62.81)     Time: 4696-2952 PT Time Calculation (min) (ACUTE ONLY): 39 min  Charges:    $Gait Training: 8-22 mins $Therapeutic Activity: 23-37 mins PT General Charges $$ ACUTE PT VISIT: 1 Visit                     Harrel Carina, DPT,  CLT  Acute Rehabilitation Services Office: [phone] (Secure chat preferred)    Claudia Desanctis 05/18/2023, 4:20 PM

## 2023-05-18 NOTE — Plan of Care (Signed)
  Problem: Clinical Measurements: Goal: Ability to maintain clinical measurements within normal limits will improve Outcome: Progressing Goal: Diagnostic test results will improve Outcome: Progressing   Problem: Nutrition: Goal: Adequate nutrition will be maintained Outcome: Progressing   

## 2023-05-18 NOTE — TOC Initial Note (Signed)
Transition of Care (TOC) - Initial/Assessment Note    Patient Details  Name: Cody Hines. MRN: [id number] Date of Birth: [birth year]  Transition of Care [facility]) CM/SW Contact:    Mearl Latin, LCSW Phone Number: 05/18/2023, 2:34 PM  Clinical Narrative:                 CSW received consult for possible SNF placement at time of discharge. CSW spoke with patient. Patient reported that he lives alone and his son and daughter live near [place]. Patient expressed understanding of PT recommendation and is agreeable to SNF placement at time of discharge. Patient reports preference for a facility in [place] but gave CSW permission to contact his son to discuss. CSW discussed insurance authorization process and will provide Medicare SNF ratings list. CSW spoke with Clovis Riley and confirmed their request for [facility] in [place]. CSW contacted them and per Addy they no longer accept outside community referrals for their rehab as of June 1st. CSW made [facility] aware and emailed him SNF list and ratings ([email or web address]). He will discuss with his sister and call CSW back. CSW sent out referrals for review and will provide bed offers as available.   Skilled Nursing Rehab Facilities-   [email or web address]   Ratings out of 5 stars (5 the highest)   Name Address  Phone # Quality Care Staffing Health Inspection Overall  [facility] [address] [phone] 2 1 5 4   [facility] [address] [place] [phone] 4 1 3 2   [facility] [address], [place] [phone] 2 1 1 1   [facility] [address], [place] [phone] 4 1 3 2   [facility]  [address], [place] [phone] 3 2 5 5   [facility] [address] [facility] [phone] 2 1 2 1   [facility] [address] [place] [phone] 4 1 2 1   [facility] [address] [place]  [phone] 2 4 3 3   [address] ([facility]) [address], [place] [phone] 3 2 2 2   [facility] [address] [place], [place] [phone] 1 2 1 1   [facility]) [address], [place] [phone] 3 1 1 1   [facility] [address] [place] [phone] 4 3 4 4   [facility] [address] [place] [phone] 3 4 3 3           [facility] [address] [place] [phone]      [facility], [place] [address], [place] [phone] 1 1 2 1   [facility] [address], [place] [phone] 2 2 4 4   [facility]  [address], [place] [phone] 2 1 4 3   [facility] [address], [place] [phone] 3 3 3 3           [address] (no [facility]) [address], [place] [phone] 4 4 5 5   [facility] (No [employer]) [address] [place], [place] [phone] 2 2 4 4   [facility] [address] [place] [phone] 2 1 2 1   [facility] (No [facility]) [address], [place] [phone] 5 5 5 5   [facility] [address], [place] [phone] 2 3 5 5   [facility] [address] [place] [phone] 2 1 1 1   [facility] [address] [place] [phone] 5 2 5 5   [facility]  [address], [place] [phone] 2 2 2  2  [facility] [address], [place] [phone] 4 2 1 1   [facility] [address] [place], [place] [phone] 2 2 3 3           [facility] [address], [place] [phone] 1 1 1 1   [facility] [address], [place]lyn Clines  [phone] 2 3 3 3   [facility] (No Humana) [address] [place] [phone] 2 1 3 2   [facility]) [address], [place]an [phone] 1 1 1 1   [facility] [address] [place] Levan [phone] 3 2 5 5   [facility] [address] [place], [place] [phone] 2 1 1 1           [facility] [address] [place], [place] [phone] 4 4 5 5   [facility])  [address] [place] [phone] 2 1 2 1   [facility]) [address] [place], [place] [phone]  1 4 3   [facility] [address] [place] [phone] 3 5 4 5   [address] [place], [place] [phone] 3 2 2 2   [facility]) [address] [place] [phone] 2 1 3 2      Expected Discharge Plan: Skilled Nursing Facility Barriers to Discharge: Continued Medical Work up, English as a second language teacher, SNF Pending bed offer   Patient Goals and CMS Choice Patient states their goals for this hospitalization and ongoing recovery are:: Rehab CMS Medicare.gov Compare Post Acute Care list provided to:: Patient Choice offered to / list presented to : Patient, Adult Children [place] ownership interest in [facility].provided to:: Patient    Expected Discharge Plan and Services In-house Referral: Clinical Social Work   Post Acute Care Choice: Skilled Nursing Facility Living arrangements for the past 2 months: Single Family Home                                      Prior Living Arrangements/Services Living arrangements for the past 2 months: Single Family Home Lives with:: Self Patient language and need for interpreter reviewed:: Yes Do you feel safe going back to the place where you live?: Yes      Need for Family Participation in Patient Care: Yes (Comment) Care giver support system in place?: Yes (comment)   Criminal Activity/Legal Involvement Pertinent to Current Situation/Hospitalization: No - Comment as needed  Activities of Daily Living Home Assistive Devices/Equipment: None ADL Screening (condition at time of admission) Patient's cognitive ability adequate to safely complete daily activities?: Yes Is the patient deaf or have difficulty hearing?: No Does the patient have difficulty seeing, even when  wearing glasses/contacts?: No Does the patient have difficulty concentrating, remembering, or making decisions?: No Patient able to express need for assistance with ADLs?: Yes Does the patient have difficulty dressing or bathing?: No Independently performs ADLs?: Yes (appropriate for developmental age) Does the patient have difficulty walking or climbing stairs?: No Weakness of Legs: Both Weakness of Arms/Hands: Both  Permission Sought/Granted Permission sought to share information with : Facility Medical sales representative, Family Supports Permission granted to share information with : Yes, Verbal Permission Granted  Share Information with NAME: Clovis Riley  Permission granted to share info w AGENCY: SNFs  Permission granted to share info w Relationship: Son  Permission granted to share info w Contact Information: [phone]  Emotional Assessment Appearance:: Appears stated age Attitude/Demeanor/Rapport: Engaged Affect (typically observed): Accepting, Appropriate  Orientation: : Oriented to Self, Oriented to Place, Oriented to  Time, Oriented to Situation Alcohol / Substance Use: Not Applicable Psych Involvement: No (comment)  Admission diagnosis:  AKI (acute kidney injury) (HCC) [N17.9] Patient Active Problem List   Diagnosis Date Noted   Heme + stool 05/17/2023   SBP (spontaneous bacterial peritonitis) (HCC) 05/14/2023   Decreased hemoglobin 05/14/2023   GI bleed 05/12/2023   Hypokalemia 05/12/2023   Uremia 05/12/2023   Acute encephalopathy 05/12/2023   AKI (acute kidney injury) (HCC) 05/12/2023   A-fib (HCC) 11/04/2022   Cirrhosis (HCC) 11/04/2022   Malnutrition of moderate degree 06/26/2021   Closed L2 vertebral fracture (HCC) 06/22/2021   Closed L3 vertebral fracture (HCC) 06/22/2021   Contact with powered lawnmower as cause of accidental injury at home as place of occurrence 06/22/2021   CKD (chronic kidney disease) stage 3, GFR 30-59 ml/min (HCC) 06/22/2021   OSA on CPAP  06/22/2021   MCI (mild cognitive impairment) 06/22/2021   Pulmonary nodule 06/22/2021   Small right kidney 04/05/2020   Aortic atherosclerosis (HCC) by CT Scan on 06/22/2019 04/04/2020   At moderate risk for fall 09/27/2019   Compression fracture of T12 vertebra (HCC) 09/27/2019   Hyperlipidemia, mixed 09/26/2019   Personal history of skin cancer 09/26/2019   Major depressive disorder, recurrent episode, moderate (HCC) 07/19/2018   Chronic lumbar pain 03/12/2018   Benign essential tremor 12/19/2016   Vitamin D deficiency 05/03/2014   Essential hypertension    GERD (gastroesophageal reflux disease)    IBS (irritable bowel syndrome)    Fibromyalgia    BPH (benign prostatic hyperplasia)    Testosterone deficiency    PCP:  Lucky Cowboy, MD Pharmacy:   [facility]) - [place], [place] - [address] [facility] [address] [place] [address] [place] [address] Phone: [phone] Fax: [phone]  [facility] [place], [place] - [address] [place] [address] Phone: [phone] Fax: [phone]     Social Determinants of Health (SDOH) Social History: SDOH Screenings   Food Insecurity: No Food Insecurity (05/12/2023)  Housing: Low Risk  (05/12/2023)  Transportation Needs: No Transportation Needs (05/12/2023)  Utilities: Not At Risk (05/12/2023)  Depression (PHQ2-9): Low Risk  (03/28/2023)  Tobacco Use: Medium Risk (05/13/2023)   SDOH Interventions:     Readmission Risk Interventions     No data to display

## 2023-05-18 NOTE — Progress Notes (Signed)
Patient ID: Britian Philmore Jonethan Veltre., male   DOB: [birth year], 77 y.o.   MRN: [id number]    Progress Note   Subjective   Day # 7 CC: Cirrhosis, altered mental status, acute kidney injury, anemia, history of colonic AVMs Diagnosed with SBP on 05/14/2023, and AKA felt secondary to rhabdomyolysis  Rocephin 2 g daily( day 5)-plan for repeat paracentesis tomorrow IV albumin Midodrine, octreotide, albumin  M ELD 3.0+=27 on 05/14/2023   Labs today-WBC 8.0/hemoglobin 9.7/hematocrit 30.8 BNP 1734 CRP 5.6 stable Procalcitonin within normal limits Potassium 3.3/BUN 60/creatinine 8.78 improved  Patient sitting up in chair, has been able to eat but not much appetite, says his abdomen is still sore and uncomfortable but definitely improved since admission.  He says he is having to strain to try to urinate and sometimes passing incontinent loose stool.  At most having 2-3 loose stools per day Conversing and mentating well    Objective   Vital signs in last 24 hours: Temp:  [98.1 F (36.7 C)-98.5 F (36.9 C)] 98.2 F (36.8 C) (08/12 0400) Pulse Rate:  [63-79] 79 (08/12 0800) Resp:  [19-22] 20 (08/12 1100) BP: (112-139)/(50-69) 139/67 (08/12 1100) SpO2:  [93 %-96 %] 93 % (08/12 0400) Last BM Date : 05/18/23 General:    Elderly white male in NAD, chronically ill-appearing Heart:  Regular rate and rhythm; no murmurs Lungs: Respirations even and unlabored, lungs CTA bilaterally Abdomen:  Soft, no significant fluid wave, soft, mild diffuse tenderness normal bowel sounds. Extremities: Trace edema bilateral Neurologic:  Alert and oriented,  grossly normal neurologically. Psych:  Cooperative. Normal mood and affect.  Intake/Output from previous day: 08/11 0701 - 08/12 0700 In: 300 [P.O.:300] Out: 2350 [Urine:2350] Intake/Output this shift: Total I/O In: 3 [I.V.:3] Out: -   Lab Results: Recent Labs    05/16/23 0205 05/16/23 1436 05/17/23 0244 05/18/23 0204  WBC 6.7  --  6.9 8.0   HGB 6.9* 9.2* 9.1* 9.7*  HCT 22.0* 28.6* 28.8* 30.8*  PLT 92*  --  95* 102*   BMET Recent Labs    05/16/23 0205 05/17/23 0244 05/18/23 0204  NA 136 136 137  K 3.3* 3.3* 3.3*  CL 102 100 100  CO2 20* 21* 21*  GLUCOSE 125* 123* 119*  BUN 68* 66* 64*  CREATININE 9.77* 9.30* 8.78*  CALCIUM 8.1* 8.5* 8.4*   LFT Recent Labs    05/18/23 0204  ALBUMIN 3.2*   PT/INR No results for input(s): "LABPROT", "INR" in the last 72 hours.  Studies/Results: DG Chest Port 1 View  Result Date: 05/17/2023 CLINICAL DATA:  77 year old male with shortness of breath. Cirrhosis, hepatic encephalopathy. EXAM: PORTABLE CHEST 1 VIEW COMPARISON:  Portable chest 11/04/2022. FINDINGS: Portable AP semi upright view at 0628 hours. Lower lung volumes. Partially obscured mediastinal contours, those visible appears stable since January. Visualized tracheal air column is within normal limits. Multifocal bilateral Patchy and confluent lung opacity, with bibasilar veiling opacity which is more pronounced on the left. The left upper lobe is relatively spared. No pneumothorax. No air bronchograms. Paucity of bowel gas in the visible abdomen. No acute osseous abnormality identified. IMPRESSION: 1. Positive for extensive bilateral pulmonary opacity, sparing only the left upper lobe. Bilateral pleural effusions suspected, larger on the left. 2. Differential considerations include bilateral pneumonia with effusions (favored). Aspiration felt less likely. Asymmetric pulmonary edema with atelectasis and effusions also possible. Electronically Signed   By: Odessa Fleming M.D.   On: 05/17/2023 06:58       Assessment /  Plan:    #25 77 year old white male admitted 1 week ago after being found down at home, patient believes he was down for 3 days.  Diagnosed with acute kidney injury on chronic kidney disease with rhabdomyolysis  Acute kidney injury is gradually improving Following for component of HRS with midodrine, albumin and  octreotide  #2 SBP-diagnosed on 05/14/2023 with paracentesis-now on high-dose Rocephin. Plan for repeat paracentesis tomorrow, remove no more than 2 L, send for cell counts Gram stain culture to assess for response to IV Rocephin.  #3 cirrhosis, decompensated, #4 severe anemia, heme positive-entheses 2 units earlier this admission   EGD and colonoscopy July 2023-no varices, portal gastropathy  Multiple small AVMs were found in the sigmoid colon, congested mucosa in the right colon secondary to portal hypertension one 4 mm sigmoid colon polyp and internal hemorrhoids  Will need EGD, and possibly colonoscopy prior to discharge  #5 A-fib-Eliquis currently on hold     Principal Problem:   AKI (acute kidney injury) (HCC) Active Problems:   Essential hypertension   GERD (gastroesophageal reflux disease)   Fibromyalgia   Chronic lumbar pain   Major depressive disorder, recurrent episode, moderate (HCC)   Hyperlipidemia, mixed   CKD (chronic kidney disease) stage 3, GFR 30-59 ml/min (HCC)   OSA on CPAP   MCI (mild cognitive impairment)   A-fib (HCC)   Cirrhosis (HCC)   GI bleed   Hypokalemia   Uremia   Acute encephalopathy   SBP (spontaneous bacterial peritonitis) (HCC)   Decreased hemoglobin   Heme + stool     LOS: 6 days     PA-C 05/18/2023, 2:36 PM

## 2023-05-18 NOTE — Progress Notes (Signed)
PROGRESS NOTE        PATIENT DETAILS Name: Cody Hines. Age: 77 y.o. Sex: male Date of Birth: Dec 14, 1945 Admit Date: 05/12/2023 Admitting Physician Synetta Fail, MD UEA:[id number], [name], MD  Brief Summary: Patient is a 77 y.o.  male with history of HTN, HLD, PAF on Eliquis, cirrhosis, EtOH use, depression, mild cognitive impairment who was found by significant other confused (had not heard from him for 10 days-separated from wife).  He was found to have severe anemia, AKI, rhabdomyolysis, encephalopathy and subsequently admitted to the hospitalist service.  He was provided with supportive care-including IV fluids/blood transfusion/Rocephin nephrology and gastroenterology followed closely-paracentesis on 8/8 was consistent with SBP.    Significant events: 8/6>> admit to [facility]  Significant studies: 8/6>> CT head: No acute intracranial abnormality 8/6>> CT C-spine: No fracture/dislocation 8/6>> x-ray right hip: Moderate to severe OA 8/6>> x-ray left elbow: No fracture 8/6>> renal ultrasound: Atrophic nonvisualized right kidney-normal sonographic appearance of left kidney.  Significant microbiology data: 8/8>> peritoneal fluid culture: No growth  Procedures: 8/8>> paracentesis (WBC 1339 with 79% neutrophils)  Consults: Nephrology GI  Subjective:  Patient in bed, appears comfortable, denies any headache, no fever, no chest pain or pressure, no shortness of breath , no abdominal pain. No new focal weakness.   Objective: Vitals: Blood pressure (!) 118/59, pulse 76, temperature 98.2 F (36.8 C), temperature source Oral, resp. rate 20, height 6\' 1"  (1.854 m), weight 102.3 kg, SpO2 93%.   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 Renal function continues to worsen  Nephrology following, diuresis and management of renal function per nephrology.  Hypokalemia Replaced  Rhabdomyolysis Likely to be being down for the past several days at home CK downtrending Continue to trend CK and stabilizing  Normocytic anemia Etiology unclear-probably some element of AKI/acute illness contributing-could have had a transient GI bleed at home (history of cirrhosis)-does have heme positive stools and GI following, contemplating EGD later this admission has received 2 units of packed RBC earlier this admission getting another 2 units on 05/16/2023.  Continue to monitor.  Acute metabolic encephalopathy Likely due to AKI Seems to have improved significantly. Neuroimaging negative Ammonia stable on 8/6  Spontaneous bacterial peritonitis Had a benign abdominal exam-minimal ascites by physical exam S/p paracentesis on 8/8-consistent with SBP On IV Rocephin Follow cultures-but negative so far on 812 2024-day 4, cytology stable, GI following  History of alcoholic liver cirrhosis Supportive care Unclear if he still consumes alcohol Diuretics on hold Continue lactulose  HTN BP stable Losartan/Cardizem/metoprolol/diuretics remain on hold  PAF Sinus rhythm Amiodarone Eliquis on hold due to severity anemia-concern for GI bleed Telemetry monitoring  HLD Hold statin given rhabdo  OSA CPAP nightly when mentation better  Depression Hold Zoloft/lorazepam for now  Debility/deconditioning PT/OT/SLP eval May require SNF  BMI: Estimated body mass index is 29.76 kg/m as calculated from the  following:  Height as of this encounter: 6\' 1"  (1.854 m).   Weight as of this encounter: 102.3 kg.   Code status:   Code Status: Full Code   DVT Prophylaxis: Place and maintain sequential compression device Start: 05/16/23 0800 SCDs Start: 05/12/23 1427    Family Communication: Son-Mitchell-[phone]-updated 8/9   Disposition Plan: Status is: Inpatient Remains inpatient appropriate because: Severity of illness   Planned Discharge Destination:Skilled nursing facility-when medically stable-currently not ready for discharge.   Diet: Diet Order             Diet regular Room service appropriate? Yes with Assist; Fluid consistency: Thin  Diet effective now                     Antimicrobial agents: Anti-infectives (From admission, onward)    Start     Dose/Rate Route Frequency Ordered Stop   05/15/23 1100  cefTRIAXone (ROCEPHIN) 2 g in sodium chloride 0.9 % 100 mL IVPB        2 g 200 mL/hr over 30 Minutes Intravenous Every 24 hours 05/14/23 1513     05/13/23 1100  cefTRIAXone (ROCEPHIN) 1 g in sodium chloride 0.9 % 100 mL IVPB  Status:  Discontinued        1 g 200 mL/hr over 30 Minutes Intravenous Every 24 hours 05/13/23 0949 05/14/23 1513        MEDICATIONS: Scheduled Meds:  sodium chloride   Intravenous Once   amiodarone  200 mg Oral Daily   ferrous sulfate  325 mg Oral BID WC   folic acid  1 mg Oral Daily   furosemide  40 mg Intravenous Q12H   midodrine  10 mg Oral BID WC   octreotide  200 mcg Subcutaneous Q8H   pantoprazole  40 mg Oral BID   potassium chloride  40 mEq Oral Once   sodium chloride flush  3 mL Intravenous Q12H   Continuous Infusions:  cefTRIAXone (ROCEPHIN)  IV 2 g (05/17/23 1002)   PRN Meds:.acetaminophen **OR** acetaminophen   I have personally reviewed following labs and imaging studies  LABORATORY DATA: CBC: Recent Labs  Lab 05/12/23 1257 05/13/23 0055 05/14/23 0001 05/15/23 0039 05/16/23 0205 05/16/23 1436  05/17/23 0244 05/18/23 0204  WBC 7.3   < > 7.6 6.9 6.7  --  6.9 8.0  NEUTROABS 5.3  --   --   --  4.7  --  4.5 5.3  HGB 5.8*   < > 7.7* 7.2* 6.9* 9.2* 9.1* 9.7*  HCT 17.8*   < > 23.1* 22.7* 22.0* 28.6* 28.8* 30.8*  MCV 85.2   < > 84.9 85.7 86.3  --  86.7 87.5  PLT 104*   < > 93* 92* 92*  --  95* 102*   < > = values in this interval not displayed.    Basic Metabolic Panel: Recent Labs  Lab 05/12/23 1602 05/12/23 1745 05/13/23 0626 05/13/23 0957 05/14/23 0001 05/14/23 0847 05/14/23 1637 05/15/23 0039 05/16/23 0205 05/17/23 0244 05/18/23 0204  NA  --    < > 134*   < > 135   < > 135 133* 136 136 137  K  --    < > 2.9*   < > 2.5*   < > 2.9* 3.0* 3.3* 3.3* 3.3*  CL  --    < > 91*   < > 97*   < > 100 100 102 100 100  CO2  --    < > 25   < >  23   < > 21* 22 20* 21* 21*  GLUCOSE  --    < > 124*   < > 143*   < > 132* 114* 125* 123* 119*  BUN  --    < > 70*   < > 70*   < > 71* 71* 68* 66* 64*  CREATININE  --    < > 9.28*   < > 9.57*   < > 9.99* 10.02* 9.77* 9.30* 8.78*  CALCIUM  --    < > 6.9*   < > 7.1*   < > 7.2* 7.5* 8.1* 8.5* 8.4*  MG 2.2  --  2.0  --  2.1  --   --  2.1  --   --   --   PHOS 5.2*  --  5.9*  --  5.5*  --   --   --  5.5* 5.5* 4.8*   < > = values in this interval not displayed.    GFR: Estimated Creatinine Clearance: 9.3 mL/min (A) (by C-G formula based on SCr of 8.78 mg/dL (H)).  Liver Function Tests: Recent Labs  Lab 05/12/23 1159 05/14/23 0001 05/15/23 0039 05/16/23 0205 05/17/23 0244 05/18/23 0204  AST 123* 76* 46*  --   --   --   ALT 38 38 30  --   --   --   ALKPHOS 50 52 48  --   --   --   BILITOT 2.1* 1.9* 1.9*  --   --   --   PROT 5.4* 5.6* 5.5*  --   --   --   ALBUMIN 2.7* 2.7* 3.0* 3.5 3.3* 3.2*   No results for input(s): "LIPASE", "AMYLASE" in the last 168 hours. Recent Labs  Lab 05/12/23 1159  AMMONIA 25    Coagulation Profile: Recent Labs  Lab 05/13/23 1002  INR 1.6*    Cardiac Enzymes: Recent Labs  Lab 05/12/23 1159  05/13/23 0057 05/14/23 0001 05/15/23 0039  CKTOTAL 3,606* 2,587* 1,504* 601*   Anemia Panel: Recent Labs    05/16/23 0559  VITAMINB12 560  FOLATE 11.7  FERRITIN 28  TIBC 290  IRON 16*  RETICCTPCT 1.3    Urine analysis:    Component Value Date/Time   COLORURINE YELLOW 05/13/2023 1522   APPEARANCEUR HAZY (A) 05/13/2023 1522   LABSPEC 1.011 05/13/2023 1522   PHURINE 5.0 05/13/2023 1522   GLUCOSEU NEGATIVE 05/13/2023 1522   HGBUR LARGE (A) 05/13/2023 1522   BILIRUBINUR NEGATIVE 05/13/2023 1522   KETONESUR NEGATIVE 05/13/2023 1522   PROTEINUR 30 (A) 05/13/2023 1522   UROBILINOGEN 0.2 05/05/2014 1114   NITRITE NEGATIVE 05/13/2023 1522   LEUKOCYTESUR NEGATIVE 05/13/2023 1522    Sepsis Labs: Lactic Acid, Venous    Component Value Date/Time   LATICACIDVEN 2.4 (HH) 06/22/2021 1519    MICROBIOLOGY: Recent Results (from the past 240 hour(s))  Gram stain     Status: None   Collection Time: 05/14/23 11:52 AM   Specimen: Abdomen; Peritoneal Fluid  Result Value Ref Range Status   Specimen Description PERITONEAL  Final   Special Requests NONE  Final   Gram Stain   Final    FEW WBC PRESENT, PREDOMINANTLY PMN NO ORGANISMS SEEN Performed at [facility], [address], [place], [place] [address]    Report Status 05/14/2023 FINAL  Final  Culture, body fluid w Gram Stain-bottle     Status: None (Preliminary result)   Collection Time: 05/14/23 11:52 AM   Specimen: Peritoneal  [place]  Result Value Ref Range Status   Specimen Description PERITONEAL  Final   Special Requests NONE  Final   Culture   Final    NO GROWTH 4 DAYS Performed at [facility], [address] [place], [place] [address]    Report Status PENDING  Incomplete    RADIOLOGY STUDIES/RESULTS: DG Chest Port 1 View  Result Date: 05/17/2023 CLINICAL DATA:  77 year old male with shortness of breath. Cirrhosis, hepatic encephalopathy. EXAM: PORTABLE CHEST 1 VIEW COMPARISON:  Portable chest  11/04/2022. FINDINGS: Portable AP semi upright view at 0628 hours. Lower lung volumes. Partially obscured mediastinal contours, those visible appears stable since January. Visualized tracheal air column is within normal limits. Multifocal bilateral Patchy and confluent lung opacity, with bibasilar veiling opacity which is more pronounced on the left. The left upper lobe is relatively spared. No pneumothorax. No air bronchograms. Paucity of bowel gas in the visible abdomen. No acute osseous abnormality identified. IMPRESSION: 1. Positive for extensive bilateral pulmonary opacity, sparing only the left upper lobe. Bilateral pleural effusions suspected, larger on the left. 2. Differential considerations include bilateral pneumonia with effusions (favored). Aspiration felt less likely. Asymmetric pulmonary edema with atelectasis and effusions also possible. Electronically Signed   By: Odessa Fleming M.D.   On: 05/17/2023 06:58     LOS: 6 days   Signature  -    Susa Raring M.D on 05/18/2023 at 8:12 AM   -  To page go to [email or web address]

## 2023-05-18 NOTE — Progress Notes (Signed)
Assessment/Plan: 77 year old WM with cirrhosis , HFrEF, afib on appropriate meds-  also stage 3 CKD- and solitary functioning kidney  now presents found down with AKI  1.Renal- baseline CKD-  crt 1.4-1.6-  last checked in May of 24.  Now with A on CRF in the setting of being found down, rhabdo, hypotension on ARB.  Renal US showed solitary functioning kidney without hydro-  urine pretty bland -  s/p  aggressive hydration- CK down ,   holding ARB- no absolute indications for RRT-  not great that crt has not improved-  possibly established ATN but making urine now so that is better.  edematous -  IVF stopped.  Continue to follow for improvement.  Now in the differential is HRS so has been started on appropriate treatment for that  Renal function slowly improving and UOP improving which is promising.  Continue Lasix 40mg  BID.  No indication for HD and clinically has improved-  continue to observe  2. Hypertension/volume  - dry and hypotensive- initially treated with aggressive hydration and then stopped  ivf as she was becoming more edematous.    3. Hypokalemia-  repletion -  will give another 40 PO today for 3.3    Mag is OK  4. Anemia  - is pretty low to be CKD related-  suspecting a GIB-  transfused 2 units on 8/6-   another unit 8/10  5. GI-  GI on board-  treating him for SBP and also giving lactulose     Subjective:  oriented to place and time; states he's starting to feel better but poor appetite still-   UOP improving (~1L /24hrs)  Objective Vital signs in last 24 hours: Vitals:   05/17/23 1540 05/17/23 2000 05/18/23 0000 05/18/23 0400  BP: 130/62 126/69 138/68 (!) 118/59  Pulse: 66 63 67 76  Resp: (!) 22 20 19 20   Temp: 98.2 F (36.8 C) 98.1 F (36.7 C) 98.5 F (36.9 C) 98.2 F (36.8 C)  TempSrc: Oral Oral Oral Oral  SpO2: 93% 94% 96% 93%  Weight:      Height:       Weight change:   Intake/Output Summary (Last 24 hours) at 05/18/2023 0653 Last data filed at 05/17/2023  2000 Gross per 24 hour  Intake 300 ml  Output 1050 ml  Net -750 ml       ,  W    Labs: Basic Metabolic Panel: Recent Labs  Lab 05/16/23 0205 05/17/23 0244 05/18/23 0204  NA 136 136 137  K 3.3* 3.3* 3.3*  CL 102 100 100  CO2 20* 21* 21*  GLUCOSE 125* 123* 119*  BUN 68* 66* 64*  CREATININE 9.77* 9.30* 8.78*  CALCIUM 8.1* 8.5* 8.4*  PHOS 5.5* 5.5* 4.8*   Liver Function Tests: Recent Labs  Lab 05/12/23 1159 05/14/23 0001 05/15/23 0039 05/16/23 0205 05/17/23 0244 05/18/23 0204  AST 123* 76* 46*  --   --   --   ALT 38 38 30  --   --   --   ALKPHOS 50 52 48  --   --   --   BILITOT 2.1* 1.9* 1.9*  --   --   --   PROT 5.4* 5.6* 5.5*  --   --   --   ALBUMIN 2.7* 2.7* 3.0* 3.5 3.3* 3.2*   No results for input(s): "LIPASE", "AMYLASE" in the last 168 hours. Recent Labs  Lab 05/12/23 1159  AMMONIA 25   CBC: Recent Labs  Lab 05/14/23 0001 05/15/23 0039 05/16/23 0205 05/16/23 1436 05/17/23 0244 05/18/23 0204  WBC 7.6 6.9 6.7  --  6.9 8.0  NEUTROABS  --   --  4.7  --  4.5 5.3  HGB 7.7* 7.2* 6.9* 9.2* 9.1* 9.7*  HCT 23.1* 22.7* 22.0* 28.6* 28.8* 30.8*  MCV 84.9 85.7 86.3  --  86.7 87.5  PLT 93* 92* 92*  --  95* 102*   Cardiac Enzymes: Recent Labs  Lab 05/12/23 1159 05/13/23 0057 05/14/23 0001 05/15/23 0039  CKTOTAL 3,606* 2,587* 1,504* 601*   CBG: No results for input(s): "GLUCAP" in the last 168 hours.  Iron Studies:  Recent Labs    05/16/23 0559  IRON 16*  TIBC 290  FERRITIN 28   Studies/Results: DG Chest Port 1 View  Result Date: 05/17/2023 CLINICAL DATA:  77 year old male with shortness of breath. Cirrhosis, hepatic encephalopathy. EXAM: PORTABLE CHEST 1 VIEW COMPARISON:  Portable chest 11/04/2022. FINDINGS: Portable AP semi upright view at 0628 hours. Lower lung volumes. Partially obscured mediastinal contours, those visible appears stable since January. Visualized tracheal air column is within normal limits. Multifocal bilateral  Patchy and confluent lung opacity, with bibasilar veiling opacity which is more pronounced on the left. The left upper lobe is relatively spared. No pneumothorax. No air bronchograms. Paucity of bowel gas in the visible abdomen. No acute osseous abnormality identified. IMPRESSION: 1. Positive for extensive bilateral pulmonary opacity, sparing only the left upper lobe. Bilateral pleural effusions suspected, larger on the left. 2. Differential considerations include bilateral pneumonia with effusions (favored). Aspiration felt less likely. Asymmetric pulmonary edema with atelectasis and effusions also possible. Electronically Signed   By: Odessa Fleming M.D.   On: 05/17/2023 06:58   Medications: Infusions:  cefTRIAXone (ROCEPHIN)  IV 2 g (05/17/23 1002)    Scheduled Medications:  sodium chloride   Intravenous Once   amiodarone  200 mg Oral Daily   ferrous sulfate  325 mg Oral BID WC   folic acid  1 mg Oral Daily   furosemide  40 mg Intravenous Q12H   midodrine  10 mg Oral BID WC   octreotide  200 mcg Subcutaneous Q8H   pantoprazole  40 mg Oral BID   sodium chloride flush  3 mL Intravenous Q12H    have reviewed scheduled and prn medications.  Physical Exam: General: oriented to time and place Heart: irreg Lungs: dec BS at bases Abdomen: soft, non tender  Extremities: pitting edema  GU: male purewick    05/18/2023,6:53 AM  LOS: 6 days

## 2023-05-18 NOTE — NC FL2 (Signed)
[place] MEDICAID FL2 LEVEL OF CARE FORM     IDENTIFICATION  Patient Name: Cody Hines. Birthdate: 1946-08-05 Sex: male Admission Date (Current Location): 05/12/2023  [facility] and [place] Number:  Producer, television/film/video and Address:  [facility], [address] [place], [place] [address]      Provider Number: [id number]  Attending Physician Name and Address:  Leroy Sea, MD  Relative Name and Phone Number:       Current Level of Care: Hospital Recommended Level of Care: Skilled Nursing Facility Prior Approval Number:    Date Approved/Denied:   PASRR Number: [id number]  Discharge Plan: SNF    Current Diagnoses: Patient Active Problem List   Diagnosis Date Noted   Heme + stool 05/17/2023   SBP (spontaneous bacterial peritonitis) (HCC) 05/14/2023   Decreased hemoglobin 05/14/2023   GI bleed 05/12/2023   Hypokalemia 05/12/2023   Uremia 05/12/2023   Acute encephalopathy 05/12/2023   AKI (acute kidney injury) (HCC) 05/12/2023   A-fib (HCC) 11/04/2022   Cirrhosis (HCC) 11/04/2022   Malnutrition of moderate degree 06/26/2021   Closed L2 vertebral fracture (HCC) 06/22/2021   Closed L3 vertebral fracture (HCC) 06/22/2021   Contact with powered lawnmower as cause of accidental injury at home as place of occurrence 06/22/2021   CKD (chronic kidney disease) stage 3, GFR 30-59 ml/min (HCC) 06/22/2021   OSA on CPAP 06/22/2021   MCI (mild cognitive impairment) 06/22/2021   Pulmonary nodule 06/22/2021   Small right kidney 04/05/2020   Aortic atherosclerosis (HCC) by CT Scan on 06/22/2019 04/04/2020   At moderate risk for fall 09/27/2019   Compression fracture of T12 vertebra (HCC) 09/27/2019   Hyperlipidemia, mixed 09/26/2019   Personal history of skin cancer 09/26/2019   Major depressive disorder, recurrent episode, moderate (HCC) 07/19/2018   Chronic lumbar pain 03/12/2018   Benign essential tremor 12/19/2016   Vitamin D  deficiency 05/03/2014   Essential hypertension    GERD (gastroesophageal reflux disease)    IBS (irritable bowel syndrome)    Fibromyalgia    BPH (benign prostatic hyperplasia)    Testosterone deficiency     Orientation RESPIRATION BLADDER Height & Weight     Self, Time, Situation, Place  Normal Continent, External catheter Weight: 225 lb 8.5 oz (102.3 kg) Height:  6\' 1"  (185.4 cm)  BEHAVIORAL SYMPTOMS/MOOD NEUROLOGICAL BOWEL NUTRITION STATUS      Incontinent Diet (See dc summary)  AMBULATORY STATUS COMMUNICATION OF NEEDS Skin   Extensive Assist Verbally Normal                       Personal Care Assistance Level of Assistance  Bathing, Feeding, Dressing Bathing Assistance: Maximum assistance Feeding assistance: Limited assistance Dressing Assistance: Limited assistance     Functional Limitations Info             SPECIAL CARE FACTORS FREQUENCY  PT (By licensed PT), OT (By licensed OT)     PT Frequency: 5x/week OT Frequency: 5x/week            Contractures Contractures Info: Not present    Additional Factors Info  Code Status, Allergies Code Status Info: Full Allergies Info: Asa (Aspirin), Atorvastatin, Celebrex (Celecoxib), Codeine, Savella (Milnacipran Hcl), Viagra (Sildenafil Citrate), Penicillins           Current Medications (05/18/2023):  This is the current hospital active medication list Current Facility-Administered Medications  Medication Dose Route Frequency Provider Last Rate Last Admin  0.9 %  sodium chloride infusion (Manually program via Guardrails IV Fluids)   Intravenous Once Howerter, Justin B, DO       acetaminophen (TYLENOL) tablet 650 mg  650 mg Oral Q6H PRN Synetta Fail, MD   650 mg at 05/14/23 1050   Or   acetaminophen (TYLENOL) suppository 650 mg  650 mg Rectal Q6H PRN Synetta Fail, MD       amiodarone (PACERONE) tablet 200 mg  200 mg Oral Daily Synetta Fail, MD   200 mg at 05/18/23 0840   cefTRIAXone  (ROCEPHIN) 2 g in sodium chloride 0.9 % 100 mL IVPB  2 g Intravenous Q24H Iva Boop, MD 200 mL/hr at 05/18/23 1015 2 g at 05/18/23 1015   ferrous sulfate tablet 325 mg  325 mg Oral BID WC Leroy Sea, MD   325 mg at 05/18/23 0840   folic acid (FOLVITE) tablet 1 mg  1 mg Oral Daily Leroy Sea, MD   1 mg at 05/18/23 0840   furosemide (LASIX) injection 40 mg  40 mg Intravenous Q12H Annie Sable, MD   40 mg at 05/18/23 0844   midodrine (PROAMATINE) tablet 10 mg  10 mg Oral BID WC Annie Sable, MD   10 mg at 05/18/23 0840   octreotide (SANDOSTATIN) injection 200 mcg  200 mcg Subcutaneous Q8H Annie Sable, MD   200 mcg at 05/18/23 0641   pantoprazole (PROTONIX) EC tablet 40 mg  40 mg Oral BID Meredith Pel, NP   40 mg at 05/18/23 0840   sodium chloride flush (NS) 0.9 % injection 3 mL  3 mL Intravenous Q12H Synetta Fail, MD   3 mL at 05/18/23 1610     Discharge Medications: Please see discharge summary for a list of discharge medications.  Relevant Imaging Results:  Relevant Lab Results:   Additional Information SSN: [id number]  Mearl Latin, LCSW

## 2023-05-19 ENCOUNTER — Inpatient Hospital Stay (HOSPITAL_COMMUNITY): Payer: Medicare Other

## 2023-05-19 ENCOUNTER — Encounter (HOSPITAL_COMMUNITY): Payer: Self-pay | Admitting: Internal Medicine

## 2023-05-19 DIAGNOSIS — M6282 Rhabdomyolysis: Secondary | ICD-10-CM | POA: Diagnosis not present

## 2023-05-19 DIAGNOSIS — N179 Acute kidney failure, unspecified: Secondary | ICD-10-CM | POA: Diagnosis not present

## 2023-05-19 DIAGNOSIS — R4182 Altered mental status, unspecified: Secondary | ICD-10-CM | POA: Diagnosis not present

## 2023-05-19 DIAGNOSIS — K7460 Unspecified cirrhosis of liver: Secondary | ICD-10-CM | POA: Diagnosis not present

## 2023-05-19 HISTORY — PX: IR PARACENTESIS: IMG2679

## 2023-05-19 LAB — BODY FLUID CELL COUNT WITH DIFFERENTIAL
Eos, Fluid: 2 %
Lymphs, Fluid: 47 %
Monocyte-Macrophage-Serous Fluid: 28 % — ABNORMAL LOW (ref 50–90)
Neutrophil Count, Fluid: 23 % (ref 0–25)
Total Nucleated Cell Count, Fluid: 710 cu mm (ref 0–1000)

## 2023-05-19 LAB — GRAM STAIN: Gram Stain: NONE SEEN

## 2023-05-19 MED ORDER — LIDOCAINE HCL 1 % IJ SOLN
INTRAMUSCULAR | Status: AC
  Filled 2023-05-19: qty 20

## 2023-05-19 MED ORDER — POTASSIUM CHLORIDE CRYS ER 20 MEQ PO TBCR
40.0000 meq | EXTENDED_RELEASE_TABLET | Freq: Once | ORAL | Status: AC
  Administered 2023-05-19: 40 meq via ORAL
  Filled 2023-05-19: qty 2

## 2023-05-19 MED ORDER — TAMSULOSIN HCL 0.4 MG PO CAPS
0.4000 mg | ORAL_CAPSULE | Freq: Every day | ORAL | Status: DC
  Administered 2023-05-19 – 2023-05-25 (×7): 0.4 mg via ORAL
  Filled 2023-05-19 (×7): qty 1

## 2023-05-19 NOTE — TOC Progression Note (Addendum)
Transition of Care (TOC) - Progression Note    Patient Details  Name: Cody Hines Ahn. MRN: [id number] Date of Birth: 12/03/1945  Transition of Care [facility]) CM/SW Contact  Mearl Latin, LCSW Phone Number: 05/19/2023, 8:39 AM  Clinical Narrative:    8:39am-CSW emailed [facility] current list of SNF bed offers so far. Awaiting responses from [facility] and [facility].   2:37 PM-Updated son with bed offers. He requested CSW contact [facility] in [place] (CSW left voicemail requesting fax number) and [facility] [place] (CSW spoke with Joni Reining and faxed referral to [phone]). Son stated he will transport patient by car.   Expected Discharge Plan: Skilled Nursing Facility Barriers to Discharge: Continued Medical Work up, English as a second language teacher, SNF Pending bed offer  Expected Discharge Plan and Services In-house Referral: Clinical Social Work   Post Acute Care Choice: Skilled Nursing Facility Living arrangements for the past 2 months: Single Family Home                                       Social Determinants of Health (SDOH) Interventions SDOH Screenings   Food Insecurity: No Food Insecurity (05/12/2023)  Housing: Low Risk  (05/12/2023)  Transportation Needs: No Transportation Needs (05/12/2023)  Utilities: Not At Risk (05/12/2023)  Depression (PHQ2-9): Low Risk  (03/28/2023)  Tobacco Use: Medium Risk (05/13/2023)    Readmission Risk Interventions     No data to display

## 2023-05-19 NOTE — Progress Notes (Signed)
PROGRESS NOTE        PATIENT DETAILS Name: Cody Hines. Age: 77 y.o. Sex: male Date of Birth: 04-Jul-1946 Admit Date: 05/12/2023 Admitting Physician Synetta Fail, MD ZOX:[id number], [name], MD  Brief Summary: Patient is a 77 y.o.  male with history of HTN, HLD, PAF on Eliquis, cirrhosis, EtOH use, depression, mild cognitive impairment who was found by significant other confused (had not heard from him for 10 days-separated from wife).  He was found to have severe anemia, AKI, rhabdomyolysis, encephalopathy and subsequently admitted to the hospitalist service.  He was provided with supportive care-including IV fluids/blood transfusion/Rocephin nephrology and gastroenterology followed closely-paracentesis on 8/8 was consistent with SBP.    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  Significant microbiology data: 8/8>> peritoneal fluid culture: No growth  Procedures: 8/8>> paracentesis (WBC 1339 with 79% neutrophils)  Consults: Nephrology GI  Subjective:  Patient in bed, appears comfortable, denies any headache, no fever, no chest pain or pressure, no shortness of breath , mild abdominal pain. No new focal weakness.   Objective: Vitals: Blood pressure (!) 147/66, pulse 73, temperature 98.1 F (36.7 C), temperature source Oral, resp. rate 19, height 6\' 1"  (1.854 m), weight 102.3 kg, SpO2 96%.   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 Renal function continues to worsen  Nephrology following, diuresis and management of renal function per nephrology.  Hypokalemia Replaced  Rhabdomyolysis Likely to be being down for the past several days at home CK downtrending Continue to trend CK and stabilizing  Normocytic anemia Etiology unclear-probably some element of AKI/acute illness contributing-could have had a transient GI bleed at home (history of cirrhosis)-does have heme positive stools and GI following, contemplating EGD later this admission has received 2 units of packed RBC earlier this admission getting another 2 units on 05/16/2023.  Continue to monitor.  Acute metabolic encephalopathy Likely due to AKI Seems to have improved significantly. Neuroimaging negative Ammonia stable on 8/6  Spontaneous bacterial peritonitis Had a benign abdominal exam-minimal ascites by physical exam S/p paracentesis on 8/8-consistent with SBP On IV Rocephin Follow cultures-but negative 05/19/23-day 5, cytology stable, GI following  History of alcoholic liver cirrhosis Supportive care Unclear if he still consumes alcohol Diuretics on hold Continue lactulose  HTN BP stable Losartan/Cardizem/metoprolol/diuretics remain on hold  PAF Sinus rhythm Amiodarone Eliquis on hold due to severity anemia-concern for GI bleed Telemetry monitoring  HLD Hold statin given rhabdo  OSA CPAP nightly when mentation better  Depression Hold Zoloft/lorazepam for now  Debility/deconditioning PT/OT/SLP eval May require SNF  BMI: Estimated body mass index is 29.76 kg/m as calculated from the following:   Height  as of  this encounter: 6\' 1"  (1.854 m).   Weight as of this encounter: 102.3 kg.   Code status:   Code Status: Full Code   DVT Prophylaxis: Place and maintain sequential compression device Start: 05/16/23 0800 SCDs Start: 05/12/23 1427    Family Communication: Son-Mitchell-[phone]-updated 8/9   Disposition Plan: Status is: Inpatient Remains inpatient appropriate because: Severity of illness   Planned Discharge Destination:Skilled nursing facility-when medically stable-currently not ready for discharge.   Diet: Diet Order             Diet regular Room service appropriate? Yes with Assist; Fluid consistency: Thin  Diet effective now                     Antimicrobial agents: Anti-infectives (From admission, onward)    Start     Dose/Rate Route Frequency Ordered Stop   05/15/23 1100  cefTRIAXone (ROCEPHIN) 2 g in sodium chloride 0.9 % 100 mL IVPB        2 g 200 mL/hr over 30 Minutes Intravenous Every 24 hours 05/14/23 1513     05/13/23 1100  cefTRIAXone (ROCEPHIN) 1 g in sodium chloride 0.9 % 100 mL IVPB  Status:  Discontinued        1 g 200 mL/hr over 30 Minutes Intravenous Every 24 hours 05/13/23 0949 05/14/23 1513        MEDICATIONS: Scheduled Meds:  sodium chloride   Intravenous Once   amiodarone  200 mg Oral Daily   ferrous sulfate  325 mg Oral BID WC   folic acid  1 mg Oral Daily   furosemide  40 mg Intravenous Q12H   midodrine  10 mg Oral BID WC   octreotide  200 mcg Subcutaneous Q8H   pantoprazole  40 mg Oral BID   potassium chloride  40 mEq Oral Once   sodium chloride flush  3 mL Intravenous Q12H   Continuous Infusions:  cefTRIAXone (ROCEPHIN)  IV Stopped (05/18/23 1045)   PRN Meds:.acetaminophen **OR** acetaminophen, naLOXone (NARCAN)  injection   I have personally reviewed following labs and imaging studies  LABORATORY DATA: CBC: Recent Labs  Lab 05/12/23 1257 05/13/23 0055 05/15/23 0039 05/16/23 0205 05/16/23 1436  05/17/23 0244 05/18/23 0204 05/19/23 0639  WBC 7.3   < > 6.9 6.7  --  6.9 8.0 8.2  NEUTROABS 5.3  --   --  4.7  --  4.5 5.3 5.4  HGB 5.8*   < > 7.2* 6.9* 9.2* 9.1* 9.7* 10.4*  HCT 17.8*   < > 22.7* 22.0* 28.6* 28.8* 30.8* 32.6*  MCV 85.2   < > 85.7 86.3  --  86.7 87.5 88.6  PLT 104*   < > 92* 92*  --  95* 102* 113*   < > = values in this interval not displayed.    Basic Metabolic Panel: Recent Labs  Lab 05/12/23 1602 05/12/23 1745 05/13/23 0626 05/13/23 0957 05/14/23 0001 05/14/23 0847 05/15/23 0039 05/16/23 0205 05/17/23 0244 05/18/23 0204 05/19/23 0639  NA  --    < > 134*   < > 135   < > 133* 136 136 137 139  K  --    < > 2.9*   < > 2.5*   < > 3.0* 3.3* 3.3* 3.3* 3.2*  CL  --    < > 91*   < > 97*   < > 100 102 100 100 104  CO2  --    < > 25   < > 23   < >  22 20* 21* 21* 22  GLUCOSE  --    < > 124*   < > 143*   < > 114* 125* 123* 119* 116*  BUN  --    < > 70*   < > 70*   < > 71* 68* 66* 64* 60*  CREATININE  --    < > 9.28*   < > 9.57*   < > 10.02* 9.77* 9.30* 8.78* 8.36*  CALCIUM  --    < > 6.9*   < > 7.1*   < > 7.5* 8.1* 8.5* 8.4* 8.6*  MG 2.2  --  2.0  --  2.1  --  2.1  --   --   --   --   PHOS 5.2*  --  5.9*  --  5.5*  --   --  5.5* 5.5* 4.8* 5.3*   < > = values in this interval not displayed.    GFR: Estimated Creatinine Clearance: 9.7 mL/min (A) (by C-G formula based on SCr of 8.36 mg/dL (H)).  Liver Function Tests: Recent Labs  Lab 05/12/23 1159 05/14/23 0001 05/15/23 0039 05/16/23 0205 05/17/23 0244 05/18/23 0204 05/19/23 0639  AST 123* 76* 46*  --   --   --   --   ALT 38 38 30  --   --   --   --   ALKPHOS 50 52 48  --   --   --   --   BILITOT 2.1* 1.9* 1.9*  --   --   --   --   PROT 5.4* 5.6* 5.5*  --   --   --   --   ALBUMIN 2.7* 2.7* 3.0* 3.5 3.3* 3.2* 3.2*   No results for input(s): "LIPASE", "AMYLASE" in the last 168 hours. Recent Labs  Lab 05/12/23 1159  AMMONIA 25    Coagulation Profile: Recent Labs  Lab 05/13/23 1002  INR 1.6*     Cardiac Enzymes: Recent Labs  Lab 05/12/23 1159 05/13/23 0057 05/14/23 0001 05/15/23 0039  CKTOTAL 3,606* 2,587* 1,504* 601*   Anemia Panel: No results for input(s): "VITAMINB12", "FOLATE", "FERRITIN", "TIBC", "IRON", "RETICCTPCT" in the last 72 hours.   Urine analysis:    Component Value Date/Time   COLORURINE YELLOW 05/13/2023 1522   APPEARANCEUR HAZY (A) 05/13/2023 1522   LABSPEC 1.011 05/13/2023 1522   PHURINE 5.0 05/13/2023 1522   GLUCOSEU NEGATIVE 05/13/2023 1522   HGBUR LARGE (A) 05/13/2023 1522   BILIRUBINUR NEGATIVE 05/13/2023 1522   KETONESUR NEGATIVE 05/13/2023 1522   PROTEINUR 30 (A) 05/13/2023 1522   UROBILINOGEN 0.2 05/05/2014 1114   NITRITE NEGATIVE 05/13/2023 1522   LEUKOCYTESUR NEGATIVE 05/13/2023 1522    Sepsis Labs: Lactic Acid, Venous    Component Value Date/Time   LATICACIDVEN 2.4 (HH) 06/22/2021 1519    MICROBIOLOGY: Recent Results (from the past 240 hour(s))  Gram stain     Status: None   Collection Time: 05/14/23 11:52 AM   Specimen: Abdomen; Peritoneal Fluid  Result Value Ref Range Status   Specimen Description PERITONEAL  Final   Special Requests NONE  Final   Gram Stain   Final    FEW WBC PRESENT, PREDOMINANTLY PMN NO ORGANISMS SEEN Performed at [facility], [address], [place], [place] [address]    Report Status 05/14/2023 FINAL  Final  Culture, body fluid w Gram Stain-bottle     Status: None   Collection Time: 05/14/23 11:52 AM   Specimen: Peritoneal Washings  Result Value Ref Range Status   Specimen Description PERITONEAL  Final   Special Requests NONE  Final   Culture   Final    NO GROWTH 5 DAYS Performed at [facility], [address], [place], [place] [address]    Report Status 05/19/2023 FINAL  Final    RADIOLOGY STUDIES/RESULTS: No results found.   LOS: 7 days   Signature  -    Susa Raring M.D on 05/19/2023 at 8:30 AM   -  To page go to [email or web address]

## 2023-05-19 NOTE — Progress Notes (Signed)
Patient ID: Cody Hines., male   DOB: 1946/09/15, 77 y.o.   MRN: [id number]    Progress Note   Subjective   Day # 8 CC; cirrhosis, altered mental status, acute kidney injury, rhabdomyolysis, anemia  Diagnosed with SBP on 05/14/2023 Kidney injury felt secondary to rhabdomyolysis  Rocephin 2 g day #6 Patient had diagnostic paracentesis earlier today cell count significantly improved.  Continuing IV albumin, midodrine, octreotide  M ELD 3.0= 27  Labs today-CRP 6.5 stable Sodium 139/potassium 3.2 BUN 60/creatinine 8.36-gradual improvement WBC 8.2/hemoglobin 10.4/hematocrit 32.6/weightless 113 Peritoneal fluid cell count-total nucleated cells 710/neutrophils 23-Gram stain pending  Patient still having some diarrhea of the chest once or twice daily, says he had some lower abdominal pain/cramping in the middle of the night had to have IV Dilaudid x 1 still having some lower abdominal discomfort.,  Son at bedside updated as to status   Objective   Vital signs in last 24 hours: Temp:  [98.1 F (36.7 C)-98.6 F (37 C)] 98.6 F (37 C) (08/13 0800) Pulse Rate:  [70-73] 73 (08/13 0000) Resp:  [17-20] 17 (08/13 0800) BP: (125-154)/(66-79) 141/70 (08/13 0950) SpO2:  [95 %-97 %] 97 % (08/13 0800) Last BM Date : 05/18/23 General:    Well-developed elderly white male in NAD Heart:  Regular rate and rhythm; no murmurs Lungs: Respirations even and unlabored, lungs CTA bilaterally Abdomen:  Soft, nontense ascites, he has a distended firm bladder normal bowel sounds. Extremities:  Without edema. Neurologic:  Alert and oriented,  grossly normal neurologically. Psych:  Cooperative. Normal mood and affect.  Intake/Output from previous day: 08/12 0701 - 08/13 0700 In: 1110.9 [P.O.:500; I.V.:310.9; IV Piggyback:300] Out: 1050 [Urine:1050] Intake/Output this shift: No intake/output data recorded.  Lab Results: Recent Labs    05/17/23 0244 05/18/23 0204 05/19/23 0639  WBC  6.9 8.0 8.2  HGB 9.1* 9.7* 10.4*  HCT 28.8* 30.8* 32.6*  PLT 95* 102* 113*   BMET Recent Labs    05/17/23 0244 05/18/23 0204 05/19/23 0639  NA 136 137 139  K 3.3* 3.3* 3.2*  CL 100 100 104  CO2 21* 21* 22  GLUCOSE 123* 119* 116*  BUN 66* 64* 60*  CREATININE 9.30* 8.78* 8.36*  CALCIUM 8.5* 8.4* 8.6*   LFT Recent Labs    05/19/23 0639  ALBUMIN 3.2*   PT/INR No results for input(s): "LABPROT", "INR" in the last 72 hours.  Studies/Results: IR Paracentesis  Result Date: 05/19/2023 INDICATION: 77 year old male. 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 Request for diagnostic paracentesis. EXAM: ULTRASOUND GUIDED DIAGNOSTIC PARACENTESIS MEDICATIONS: Lidocaine 1% 10 mL COMPLICATIONS: None immediate. PROCEDURE: Informed written consent was obtained from the patient after a discussion of the risks, benefits and alternatives to treatment. A timeout was performed prior to the initiation of the procedure. Initial ultrasound scanning demonstrates a small amount of ascites within the right lower abdominal quadrant. The right lower abdomen was prepped and draped in the usual sterile fashion. 1% lidocaine was used for local anesthesia. Following this, a 19 gauge, 7-cm, Yueh catheter was introduced. An ultrasound image was saved for documentation purposes. The paracentesis was performed. The catheter was removed and a dressing was applied. The patient tolerated the procedure well without immediate post procedural complication. FINDINGS: A total of approximately 300 mL of amber colored fluid was removed. Samples were sent to the laboratory as requested by the clinical team. IMPRESSION: Successful ultrasound-guided therapeutic paracentesis yielding 300 mL of peritoneal fluid. Performed by: Anders Grant, NP PLAN:  If the patient eventually requires >/=2 paracenteses in a 30 day period, candidacy for formal evaluation by the [facility] Interventional Radiology Portal  Hypertension [facility] will be assessed. Electronically Signed   By: Judie Petit.  Shick M.D.   On: 05/19/2023 10:33       Assessment / Plan:    #46 77 year old white male with decompensated EtOH related cirrhosis, admitted after found down at home, diagnosed with acute kidney injury on chronic kidney disease with rhabdomyolysis, then also diagnosed with SBP on 05/14/2023. Currently on day 6 Rocephin Repeat paracentesis today with continued improvement in cell counts  Plan  to complete 7-day course of Rocephin grams daily Will then need to be transaddition to Cipro 500 mg daily long-term for SBP prophylaxis  #2 acute kidney injury, as per nephrology, gradually improving, managing as possible component of midodrine/albumin and octreotide  #3 bladder distention today on exam will rule outlet obstruction #4 anemia, heme positive on admission, 2 units transfused earlier this admission, no evidence of overt bleeding Had  EGD and colonoscopy July 2023 with finding of portal gastropathy, 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 Rhea Belton on Thursday  #5, history of atrial fibrillation-Eliquis currently on hold   Principal Problem:   AKI (acute kidney injury) (HCC) Active Problems:   Essential hypertension   GERD (gastroesophageal reflux disease)   Fibromyalgia   Chronic lumbar pain   Major depressive disorder, recurrent episode, moderate (HCC)   Hyperlipidemia, mixed   CKD (chronic kidney disease) stage 3, GFR 30-59 ml/min (HCC)   OSA on CPAP   MCI (mild cognitive impairment)   A-fib (HCC)   Cirrhosis (HCC)   GI bleed   Hypokalemia   Uremia   Acute encephalopathy   SBP (spontaneous bacterial peritonitis) (HCC)   Decreased hemoglobin   Heme + stool     LOS: 7 days   Zariya Minner PA-C 05/19/2023, 1:49 PM

## 2023-05-19 NOTE — Plan of Care (Signed)

## 2023-05-19 NOTE — Progress Notes (Signed)
Occupational Therapy Treatment Patient Details Name: Cody Hines. MRN: [id number] DOB: Dec 19, 1945 Today's Date: 05/19/2023   History of present illness 77 yo male presenting to ED 8/6 with AMS. Found by ex-wife confused in his bed, incontinent of stool. CTH negative. Rt hip and Lt elbow xrays negative. +AKI, dehydration, hypotension, hypokalemia, Hgb 5.8 (transfused 2 units)  PMH  significant of HTN, OSA on CPAP, MCI, multiple spinal compression fxs, CKD, afib, cirrhosis, fibromyalgia, mild cognitive impairment   OT comments  Pt making slow progress with functional goals. Pt more alert this session and agreeable to participate with OT. Pt sat EOB mod A. Pt sat EOB 6-8 minutes for grooming, simulated bathing tasks and UB dressing. Pt stood from EOB to RW mod A but immediatley sat back onto bed due to back pain. Pt returned to supine in bed mod A with LEs back onto bed. Pt able to scot self to [facility] using rails. OT to continue to follow acutely to maximize level of function and safety      If plan is discharge home, recommend the following:  Two people to help with walking and/or transfers;Two people to help with bathing/dressing/bathroom;Assistance with cooking/housework;Assistance with feeding;Direct supervision/assist for medications management;Direct supervision/assist for financial management;Assist for transportation;Help with stairs or ramp for entrance;Supervision due to cognitive status   Equipment Recommendations  None recommended by OT (defer to next venue of care)    Recommendations for Other Services      Precautions / Restrictions Precautions Precautions: Fall Restrictions Weight Bearing Restrictions: No       Mobility Bed Mobility Overal bed mobility: Needs Assistance Bed Mobility: Rolling, Supine to Sit, Sit to Supine Rolling: Min assist, Used rails   Supine to sit: Mod assist Sit to supine: Mod assist        Transfers Overall transfer level: Needs  assistance Equipment used: Rolling walker (2 wheels) Transfers: Sit to/from Stand Sit to Stand: Mod assist           General transfer comment: pt stood from EOB and immediately sat back ont bed due to back pain     Balance Overall balance assessment: Needs assistance Sitting-balance support: Bilateral upper extremity supported, Feet supported, Feet unsupported Sitting balance-Leahy Scale: Fair     Standing balance support: Bilateral upper extremity supported, During functional activity, Reliant on assistive device for balance Standing balance-Leahy Scale: Poor                             ADL either performed or assessed with clinical judgement   ADL Overall ADL's : Needs assistance/impaired     Grooming: Wash/dry hands;Wash/dry face;Sitting;Contact guard assist   Upper Body Bathing: Moderate assistance;Sitting Upper Body Bathing Details (indicate cue type and reason): simulated Lower Body Bathing: Maximal assistance;Sitting/lateral leans Lower Body Bathing Details (indicate cue type and reason): simulated Upper Body Dressing : Contact guard assist;Sitting                     General ADL Comments: Pt sat EOB 6-8 minutes. Pt stood from EOB to RW mod A but immediatley sat back onto bed due to back pain    Extremity/Trunk Assessment Upper Extremity Assessment Upper Extremity Assessment: Generalized weakness   Lower Extremity Assessment Lower Extremity Assessment: Defer to PT evaluation   Cervical / Trunk Assessment Cervical / Trunk Assessment: Other exceptions Cervical / Trunk Exceptions: h/o compression fractures with persistent pain    Vision Baseline  Vision/History: 4 Cataracts;0 No visual deficits Ability to See in Adequate Light: 0 Adequate Patient Visual Report: No change from baseline     Perception     Praxis      Cognition Arousal: Alert Behavior During Therapy: WFL for tasks assessed/performed Overall Cognitive Status: No  family/caregiver present to determine baseline cognitive functioning                                          Exercises      Shoulder Instructions       General Comments      Pertinent Vitals/ Pain       Pain Assessment Pain Assessment: No/denies pain Faces Pain Scale: Hurts little more Pain Location: back and stomach Pain Descriptors / Indicators: Aching, Discomfort, Grimacing Pain Intervention(s): Monitored during session, Limited activity within patient's tolerance, Repositioned  Home Living                                          Prior Functioning/Environment              Frequency  Min 1X/week        Progress Toward Goals  OT Goals(current goals can now be found in the care plan section)  Progress towards OT goals: Progressing toward goals     Plan      Co-evaluation                 AM-PAC OT "6 Clicks" Daily Activity     Outcome Measure   Help from another person eating meals?: None Help from another person taking care of personal grooming?: A Little Help from another person toileting, which includes using toliet, bedpan, or urinal?: Total Help from another person bathing (including washing, rinsing, drying)?: A Lot Help from another person to put on and taking off regular upper body clothing?: A Little Help from another person to put on and taking off regular lower body clothing?: Total 6 Click Score: 14    End of Session Equipment Utilized During Treatment: Gait belt;Rolling walker (2 wheels)  OT Visit Diagnosis: Muscle weakness (generalized) (M62.81);Ataxia, unspecified (R27.0);Other symptoms and signs involving cognitive function;History of falling (Z91.81)   Activity Tolerance Patient limited by pain   Patient Left in bed;with call bell/phone within reach;with bed alarm set;with nursing/sitter in room   Nurse Communication          Time: 0981-1914 OT Time Calculation (min): 23  min  Charges: OT General Charges $OT Visit: 1 Visit OT Treatments $Self Care/Home Management : 8-22 mins $Therapeutic Activity: 8-22 mins    Galen Manila 05/19/2023, 2:23 PM

## 2023-05-19 NOTE — H&P (View-Only) (Signed)
Patient ID: Cody Hines., male   DOB: 1946/09/15, 77 y.o.   MRN: [id number]    Progress Note   Subjective   Day # 8 CC; cirrhosis, altered mental status, acute kidney injury, rhabdomyolysis, anemia  Diagnosed with SBP on 05/14/2023 Kidney injury felt secondary to rhabdomyolysis  Rocephin 2 g day #6 Patient had diagnostic paracentesis earlier today cell count significantly improved.  Continuing IV albumin, midodrine, octreotide  M ELD 3.0= 27  Labs today-CRP 6.5 stable Sodium 139/potassium 3.2 BUN 60/creatinine 8.36-gradual improvement WBC 8.2/hemoglobin 10.4/hematocrit 32.6/weightless 113 Peritoneal fluid cell count-total nucleated cells 710/neutrophils 23-Gram stain pending  Patient still having some diarrhea of the chest once or twice daily, says he had some lower abdominal pain/cramping in the middle of the night had to have IV Dilaudid x 1 still having some lower abdominal discomfort.,  Son at bedside updated as to status   Objective   Vital signs in last 24 hours: Temp:  [98.1 F (36.7 C)-98.6 F (37 C)] 98.6 F (37 C) (08/13 0800) Pulse Rate:  [70-73] 73 (08/13 0000) Resp:  [17-20] 17 (08/13 0800) BP: (125-154)/(66-79) 141/70 (08/13 0950) SpO2:  [95 %-97 %] 97 % (08/13 0800) Last BM Date : 05/18/23 General:    Well-developed elderly white male in NAD Heart:  Regular rate and rhythm; no murmurs Lungs: Respirations even and unlabored, lungs CTA bilaterally Abdomen:  Soft, nontense ascites, he has a distended firm bladder normal bowel sounds. Extremities:  Without edema. Neurologic:  Alert and oriented,  grossly normal neurologically. Psych:  Cooperative. Normal mood and affect.  Intake/Output from previous day: 08/12 0701 - 08/13 0700 In: 1110.9 [P.O.:500; I.V.:310.9; IV Piggyback:300] Out: 1050 [Urine:1050] Intake/Output this shift: No intake/output data recorded.  Lab Results: Recent Labs    05/17/23 0244 05/18/23 0204 05/19/23 0639  WBC  6.9 8.0 8.2  HGB 9.1* 9.7* 10.4*  HCT 28.8* 30.8* 32.6*  PLT 95* 102* 113*   BMET Recent Labs    05/17/23 0244 05/18/23 0204 05/19/23 0639  NA 136 137 139  K 3.3* 3.3* 3.2*  CL 100 100 104  CO2 21* 21* 22  GLUCOSE 123* 119* 116*  BUN 66* 64* 60*  CREATININE 9.30* 8.78* 8.36*  CALCIUM 8.5* 8.4* 8.6*   LFT Recent Labs    05/19/23 0639  ALBUMIN 3.2*   PT/INR No results for input(s): "LABPROT", "INR" in the last 72 hours.  Studies/Results: IR Paracentesis  Result Date: 05/19/2023 INDICATION: 77 year old male. 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 Request for diagnostic paracentesis. EXAM: ULTRASOUND GUIDED DIAGNOSTIC PARACENTESIS MEDICATIONS: Lidocaine 1% 10 mL COMPLICATIONS: None immediate. PROCEDURE: Informed written consent was obtained from the patient after a discussion of the risks, benefits and alternatives to treatment. A timeout was performed prior to the initiation of the procedure. Initial ultrasound scanning demonstrates a small amount of ascites within the right lower abdominal quadrant. The right lower abdomen was prepped and draped in the usual sterile fashion. 1% lidocaine was used for local anesthesia. Following this, a 19 gauge, 7-cm, Yueh catheter was introduced. An ultrasound image was saved for documentation purposes. The paracentesis was performed. The catheter was removed and a dressing was applied. The patient tolerated the procedure well without immediate post procedural complication. FINDINGS: A total of approximately 300 mL of amber colored fluid was removed. Samples were sent to the laboratory as requested by the clinical team. IMPRESSION: Successful ultrasound-guided therapeutic paracentesis yielding 300 mL of peritoneal fluid. Performed by: Anders Grant, NP PLAN:  If the patient eventually requires >/=2 paracenteses in a 30 day period, candidacy for formal evaluation by the [facility] Interventional Radiology Portal  Hypertension Clinic will be assessed. Electronically Signed   By: Judie Petit.  Shick M.D.   On: 05/19/2023 10:33       Assessment / Plan:    #46 77 year old white male with decompensated EtOH related cirrhosis, admitted after found down at home, diagnosed with acute kidney injury on chronic kidney disease with rhabdomyolysis, then also diagnosed with SBP on 05/14/2023. Currently on day 6 Rocephin Repeat paracentesis today with continued improvement in cell counts  Plan  to complete 7-day course of Rocephin grams daily Will then need to be transaddition to Cipro 500 mg daily long-term for SBP prophylaxis  #2 acute kidney injury, as per nephrology, gradually improving, managing as possible component of midodrine/albumin and octreotide  #3 bladder distention today on exam will rule outlet obstruction #4 anemia, heme positive on admission, 2 units transfused earlier this admission, no evidence of overt bleeding Had  EGD and colonoscopy July 2023 with finding of portal gastropathy, 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 Rhea Belton on Thursday  #5, history of atrial fibrillation-Eliquis currently on hold   Principal Problem:   AKI (acute kidney injury) (HCC) Active Problems:   Essential hypertension   GERD (gastroesophageal reflux disease)   Fibromyalgia   Chronic lumbar pain   Major depressive disorder, recurrent episode, moderate (HCC)   Hyperlipidemia, mixed   CKD (chronic kidney disease) stage 3, GFR 30-59 ml/min (HCC)   OSA on CPAP   MCI (mild cognitive impairment)   A-fib (HCC)   Cirrhosis (HCC)   GI bleed   Hypokalemia   Uremia   Acute encephalopathy   SBP (spontaneous bacterial peritonitis) (HCC)   Decreased hemoglobin   Heme + stool     LOS: 7 days     PA-C 05/19/2023, 1:49 PM

## 2023-05-19 NOTE — Plan of Care (Signed)
  Problem: Clinical Measurements: Goal: Respiratory complications will improve Outcome: Progressing Goal: Cardiovascular complication will be avoided Outcome: Progressing   Problem: Coping: Goal: Level of anxiety will decrease Outcome: Progressing   

## 2023-05-19 NOTE — Procedures (Signed)
PROCEDURE SUMMARY:  Successful ultrasound guided paracentesis from the right lower quadrant.  Yielded 300 ml of amber colored fluid.  No immediate complications.  The patient tolerated the procedure well.   Specimen was sent for labs.  EBL < 2mL  If the patient eventually requires >/=2 paracenteses in a 30 day period, screening evaluation by the [facility] Interventional Radiology Portal Hypertension Clinic will be assessed.

## 2023-05-19 NOTE — Progress Notes (Signed)
Assessment/Plan: 77 year old WM with cirrhosis , HFrEF, afib on appropriate meds-  also stage 3 CKD- and solitary functioning kidney  now presents found down with AKI  1.Renal- baseline CKD-  crt 1.4-1.6-  last checked in May of 24.  Now with A on CRF in the setting of being found down, rhabdo, hypotension on ARB.  Renal US showed solitary functioning kidney without hydro-  urine pretty bland -  s/p  aggressive hydration- CK down ,   holding ARB- no absolute indications for RRT-  not great that crt has not improved-  possibly established ATN but making urine now so that is better.  edematous -  IVF stopped.  Continue to follow for improvement.  Now in the differential is HRS so has been started on appropriate treatment for that  Renal function was slowly improving, no BMET results yest today.  and UOP is still good which is promising.  Continue Lasix 40mg  BID. Do not see a PVR recently and pt has a male purewick. Placed order for nursing to check and record a PVR + also instructed the patient.  No indication for HD and clinically has improved-  continue to observe  2. Hypertension/volume  - dry and hypotensive- initially treated with aggressive hydration and then stopped  ivf as he was becoming more edematous.    3. Hypokalemia-  repletion -  still waiting for Bmet result but had to give Klorcon 8/12. 4. Anemia  - is pretty low to be CKD related-  suspecting a GIB-  transfused 2 units on 8/6-   another unit 8/10  5. GI-  GI on board-  treating him for SBP and also giving lactulose     Subjective:  oriented to place and time; states he's starting to feel better but poor appetite still (no nausea)-   UOP improving (~1L /24hrs)  Objective Vital signs in last 24 hours: Vitals:   05/18/23 1100 05/18/23 1611 05/18/23 2000 05/19/23 0000  BP: 139/67 125/68 (!) 149/67 (!) 147/66  Pulse:  70 71 73  Resp: 20  20 19   Temp:   98.4 F (36.9 C) 98.1 F (36.7 C)  TempSrc: Oral Oral Oral Oral  SpO2:    95% 96%  Weight:      Height:       Weight change:   Intake/Output Summary (Last 24 hours) at 05/19/2023 0732 Last data filed at 05/19/2023 0630 Gross per 24 hour  Intake 1110.91 ml  Output 1050 ml  Net 60.91 ml       ,  W    Labs: Basic Metabolic Panel: Recent Labs  Lab 05/16/23 0205 05/17/23 0244 05/18/23 0204  NA 136 136 137  K 3.3* 3.3* 3.3*  CL 102 100 100  CO2 20* 21* 21*  GLUCOSE 125* 123* 119*  BUN 68* 66* 64*  CREATININE 9.77* 9.30* 8.78*  CALCIUM 8.1* 8.5* 8.4*  PHOS 5.5* 5.5* 4.8*   Liver Function Tests: Recent Labs  Lab 05/12/23 1159 05/14/23 0001 05/15/23 0039 05/16/23 0205 05/17/23 0244 05/18/23 0204  AST 123* 76* 46*  --   --   --   ALT 38 38 30  --   --   --   ALKPHOS 50 52 48  --   --   --   BILITOT 2.1* 1.9* 1.9*  --   --   --   PROT 5.4* 5.6* 5.5*  --   --   --   ALBUMIN 2.7* 2.7* 3.0* 3.5 3.3*  3.2*   No results for input(s): "LIPASE", "AMYLASE" in the last 168 hours. Recent Labs  Lab 05/12/23 1159  AMMONIA 25   CBC: Recent Labs  Lab 05/15/23 0039 05/16/23 0205 05/16/23 1436 05/17/23 0244 05/18/23 0204 05/19/23 0639  WBC 6.9 6.7  --  6.9 8.0 8.2  NEUTROABS  --  4.7  --  4.5 5.3 5.4  HGB 7.2* 6.9*   < > 9.1* 9.7* 10.4*  HCT 22.7* 22.0*   < > 28.8* 30.8* 32.6*  MCV 85.7 86.3  --  86.7 87.5 88.6  PLT 92* 92*  --  95* 102* 113*   < > = values in this interval not displayed.   Cardiac Enzymes: Recent Labs  Lab 05/12/23 1159 05/13/23 0057 05/14/23 0001 05/15/23 0039  CKTOTAL 3,606* 2,587* 1,504* 601*   CBG: No results for input(s): "GLUCAP" in the last 168 hours.  Iron Studies:  No results for input(s): "IRON", "TIBC", "TRANSFERRIN", "FERRITIN" in the last 72 hours.  Studies/Results: No results found. Medications: Infusions:  cefTRIAXone (ROCEPHIN)  IV Stopped (05/18/23 1045)    Scheduled Medications:  sodium chloride   Intravenous Once   amiodarone  200 mg Oral Daily   ferrous sulfate  325 mg Oral  BID WC   folic acid  1 mg Oral Daily   furosemide  40 mg Intravenous Q12H   midodrine  10 mg Oral BID WC   octreotide  200 mcg Subcutaneous Q8H   pantoprazole  40 mg Oral BID   sodium chloride flush  3 mL Intravenous Q12H    have reviewed scheduled and prn medications.  Physical Exam: General: oriented to time and place Heart: irreg Lungs: dec BS at bases Abdomen: soft, non tender  Extremities: pitting edema  GU: male purewick    05/19/2023,7:32 AM  LOS: 7 days

## 2023-05-20 DIAGNOSIS — N179 Acute kidney failure, unspecified: Secondary | ICD-10-CM | POA: Diagnosis not present

## 2023-05-20 LAB — RENAL FUNCTION PANEL
Albumin: 2.9 g/dL — ABNORMAL LOW (ref 3.5–5.0)
Anion gap: 14 (ref 5–15)
BUN: 60 mg/dL — ABNORMAL HIGH (ref 8–23)
CO2: 22 mmol/L (ref 22–32)
Calcium: 8.4 mg/dL — ABNORMAL LOW (ref 8.9–10.3)
Chloride: 102 mmol/L (ref 98–111)
Creatinine, Ser: 8.11 mg/dL — ABNORMAL HIGH (ref 0.61–1.24)
GFR, Estimated: 6 mL/min — ABNORMAL LOW (ref >60)
Glucose, Bld: 147 mg/dL — ABNORMAL HIGH (ref 70–99)
Phosphorus: 4.4 mg/dL (ref 2.5–4.6)
Potassium: 3.3 mmol/L — ABNORMAL LOW (ref 3.5–5.1)
Sodium: 138 mmol/L (ref 135–145)

## 2023-05-20 MED ORDER — POTASSIUM CHLORIDE CRYS ER 20 MEQ PO TBCR
40.0000 meq | EXTENDED_RELEASE_TABLET | Freq: Two times a day (BID) | ORAL | Status: DC
  Administered 2023-05-20 – 2023-05-25 (×11): 40 meq via ORAL
  Filled 2023-05-20 (×11): qty 2

## 2023-05-20 MED ORDER — POTASSIUM CHLORIDE CRYS ER 20 MEQ PO TBCR
40.0000 meq | EXTENDED_RELEASE_TABLET | Freq: Once | ORAL | Status: DC
  Filled 2023-05-20: qty 2

## 2023-05-20 MED ORDER — CHLORHEXIDINE GLUCONATE CLOTH 2 % EX PADS
6.0000 | MEDICATED_PAD | Freq: Every day | CUTANEOUS | Status: DC
  Administered 2023-05-20 – 2023-05-25 (×6): 6 via TOPICAL

## 2023-05-20 NOTE — Progress Notes (Signed)
Physical Therapy Treatment Patient Details Name: Cody Hines. MRN: [id number] DOB: March 16, 1946 Today's Date: 05/20/2023   History of Present Illness 77 yo male presenting to ED 8/6 with AMS. Found by ex-wife confused in his bed, incontinent of stool. CTH negative. Rt hip and Lt elbow xrays negative. +AKI, dehydration, hypotension, hypokalemia, Hgb 5.8 (transfused 2 units)  PMH  significant of HTN, OSA on CPAP, MCI, multiple spinal compression fxs, CKD, afib, cirrhosis, fibromyalgia, mild cognitive impairment    PT Comments  Pt progressing slowly toward goals, Emphasis on transitions, sit to stands, progression of gait with the RW, limited today by fecal incontinence.    If plan is discharge home, recommend the following: A little help with walking and/or transfers;A little help with bathing/dressing/bathroom;Assistance with cooking/housework   Can travel by private vehicle     No  Equipment Recommendations  None recommended by PT    Recommendations for Other Services       Precautions / Restrictions Precautions Precautions: Fall Precaution Comments: pt reports he doesn't remember falling, but he was told he did     Mobility  Bed Mobility Overal bed mobility: Needs Assistance Bed Mobility: Supine to Sit Rolling: Min assist   Supine to sit: Mod assist     General bed mobility comments: Pt left sitting in recliner at end of session. Pt is Mod A with cues for sequencing for supine to sitting EOB    Transfers Overall transfer level: Needs assistance Equipment used: Rolling walker (2 wheels) Transfers: Sit to/from Stand Sit to Stand: Mod assist                Ambulation/Gait Ambulation/Gait assistance: Mod assist Gait Distance (Feet): 10 Feet (8 feet after time on the toilet) Assistive device: Rolling walker (2 wheels) Gait Pattern/deviations: Step-through pattern, Decreased step length - right, Decreased step length - left, Decreased stride length    Gait velocity interpretation: <1.8 ft/sec, indicate of risk for recurrent falls   General Gait Details: stiff, low amplitude steps with need to help maneuver the RW overall.   Stairs             Wheelchair Mobility     Tilt Bed    Modified Rankin (Stroke Patients Only)       Balance Overall balance assessment: Needs assistance Sitting-balance support: Bilateral upper extremity supported, Feet supported, Feet unsupported Sitting balance-Leahy Scale: Poor Sitting balance - Comments: need for external support at Eob`   Standing balance support: Bilateral upper extremity supported, During functional activity, Reliant on assistive device for balance Standing balance-Leahy Scale: Poor Standing balance comment: external support needed for stability during pericare.                            Cognition Arousal: Alert Behavior During Therapy: WFL for tasks assessed/performed Overall Cognitive Status: No family/caregiver present to determine baseline cognitive functioning                                 General Comments: NT formally, following simple commands.        Exercises Other Exercises Other Exercises: warm up hip/knee flexion/extension ROM x10 reps bil    General Comments        Pertinent Vitals/Pain Pain Assessment Pain Assessment: Faces Faces Pain Scale: Hurts little more Pain Location: back Pain Descriptors / Indicators: Aching, Discomfort Pain Intervention(s): Monitored during session  Home Living                          Prior Function            PT Goals (current goals can now be found in the care plan section) Acute Rehab PT Goals PT Goal Formulation: With patient Time For Goal Achievement: 05/28/23 Potential to Achieve Goals: Good Progress towards PT goals: Progressing toward goals    Frequency    Min 1X/week      PT Plan      Co-evaluation              AM-PAC PT "6 Clicks"  Mobility   Outcome Measure  Help needed turning from your back to your side while in a flat bed without using bedrails?: A Little Help needed moving from lying on your back to sitting on the side of a flat bed without using bedrails?: A Lot Help needed moving to and from a bed to a chair (including a wheelchair)?: A Lot Help needed standing up from a chair using your arms (e.g., wheelchair or bedside chair)?: A Lot Help needed to walk in hospital room?: A Lot Help needed climbing 3-5 steps with a railing? : A Lot 6 Click Score: 13    End of Session   Activity Tolerance: Patient tolerated treatment well Patient left: in bed;with call bell/phone within reach;with bed alarm set Nurse Communication: Mobility status PT Visit Diagnosis: History of falling (Z91.81);Muscle weakness (generalized) (M62.81)     Time: 1506 (1610)-9604 PT Time Calculation (min) (ACUTE ONLY): 26 min  Charges:    $Gait Training: 8-22 mins $Therapeutic Activity: 8-22 mins PT General Charges $$ ACUTE PT VISIT: 1 Visit                     05/20/2023  Jacinto Halim., PT Acute Rehabilitation Services [phone]  (office)   Eliseo Gum  05/20/2023, 6:10 PM

## 2023-05-20 NOTE — Progress Notes (Signed)
Assessment/Plan: 77 year old WM with cirrhosis , HFrEF, afib on appropriate meds-  also stage 3 CKD- and solitary functioning kidney  now presents found down with AKI  1.Renal- baseline CKD-  crt 1.4-1.6-  last checked in May of 24.  Now with A on CRF in the setting of being found down, rhabdo, hypotension on ARB.  Renal US showed solitary functioning kidney without hydro-  urine pretty bland -  s/p  aggressive hydration- CK down ,   holding ARB- no absolute indications for RRT-  not great that crt has not improved-  possibly established ATN but making urine now so that is better.  edematous -  IVF stopped.  Continue to follow for improvement.  Now in the differential is HRS so has been started on appropriate treatment for that  Renal function slowly improving and UOP is still good which is promising.  Continue Lasix 40mg  BID. Do not see a PVR recently (see a volume of but not recorded as a PVR) and pt has a male purewick.   PVR almost a L -> foley on 8/13 with no 3.9L UOP/24hrs. On Tamsulosin.  No indication for HD and clinically has improved-  continue to observe  2. Hypertension/volume  - dry and hypotensive- initially treated with aggressive hydration and then stopped  ivf as he was becoming more edematous.    3. Hypokalemia-  replete as needed -> will place on standing PO BID especially with the high UOP with foley. 4. Anemia  - is pretty low to be CKD related-  suspecting a GIB-  transfused 2 units on 8/6-   another unit 8/10  5. GI-  GI on board-  treating him for SBP and also giving lactulose     Subjective:  oriented to place and time; states he's starting to feel better but poor appetite still (no nausea)-   UOP improving (~3.9L /24hrs) with the foley.  Objective Vital signs in last 24 hours: Vitals:   05/19/23 2000 05/19/23 2200 05/20/23 0000 05/20/23 0400  BP: 134/66  126/60 (!) 116/57  Pulse:   75 73  Resp: (!) 23 (!) 21 (!) 23 20  Temp:   98.3 F (36.8 C) 98.1 F  (36.7 C)  TempSrc:   Oral Oral  SpO2:   97% 98%  Weight:      Height:       Weight change:   Intake/Output Summary (Last 24 hours) at 05/20/2023 0727 Last data filed at 05/20/2023 0528 Gross per 24 hour  Intake --  Output 3925 ml  Net -3925 ml       ,  W    Labs: Basic Metabolic Panel: Recent Labs  Lab 05/18/23 0204 05/19/23 0639 05/20/23 0020  NA 137 139 138  K 3.3* 3.2* 3.3*  CL 100 104 102  CO2 21* 22 22  GLUCOSE 119* 116* 147*  BUN 64* 60* 60*  CREATININE 8.78* 8.36* 8.11*  CALCIUM 8.4* 8.6* 8.4*  PHOS 4.8* 5.3* 4.4   Liver Function Tests: Recent Labs  Lab 05/14/23 0001 05/15/23 0039 05/16/23 0205 05/18/23 0204 05/19/23 0639 05/20/23 0020  AST 76* 46*  --   --   --   --   ALT 38 30  --   --   --   --   ALKPHOS 52 48  --   --   --   --   BILITOT 1.9* 1.9*  --   --   --   --  PROT 5.6* 5.5*  --   --   --   --   ALBUMIN 2.7* 3.0*   < > 3.2* 3.2* 2.9*   < > = values in this interval not displayed.   No results for input(s): "LIPASE", "AMYLASE" in the last 168 hours. No results for input(s): "AMMONIA" in the last 168 hours.  CBC: Recent Labs  Lab 05/16/23 0205 05/16/23 1436 05/17/23 0244 05/18/23 0204 05/19/23 0639 05/20/23 0020  WBC 6.7  --  6.9 8.0 8.2 8.0  NEUTROABS 4.7  --  4.5 5.3 5.4 5.6  HGB 6.9*   < > 9.1* 9.7* 10.4* 9.9*  HCT 22.0*   < > 28.8* 30.8* 32.6* 30.8*  MCV 86.3  --  86.7 87.5 88.6 88.3  PLT 92*  --  95* 102* 113* 105*   < > = values in this interval not displayed.   Cardiac Enzymes: Recent Labs  Lab 05/14/23 0001 05/15/23 0039  CKTOTAL 1,504* 601*   CBG: No results for input(s): "GLUCAP" in the last 168 hours.  Iron Studies:  No results for input(s): "IRON", "TIBC", "TRANSFERRIN", "FERRITIN" in the last 72 hours.  Studies/Results: IR Paracentesis  Result Date: 05/19/2023 INDICATION: 77 year old male. 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 Request for diagnostic  paracentesis. EXAM: ULTRASOUND GUIDED DIAGNOSTIC PARACENTESIS MEDICATIONS: Lidocaine 1% 10 mL COMPLICATIONS: None immediate. PROCEDURE: Informed written consent was obtained from the patient after a discussion of the risks, benefits and alternatives to treatment. A timeout was performed prior to the initiation of the procedure. Initial ultrasound scanning demonstrates a small amount of ascites within the right lower abdominal quadrant. The right lower abdomen was prepped and draped in the usual sterile fashion. 1% lidocaine was used for local anesthesia. Following this, a 19 gauge, 7-cm, Yueh catheter was introduced. An ultrasound image was saved for documentation purposes. The paracentesis was performed. The catheter was removed and a dressing was applied. The patient tolerated the procedure well without immediate post procedural complication. FINDINGS: A total of approximately 300 mL of amber colored fluid was removed. Samples were sent to the laboratory as requested by the clinical team. IMPRESSION: Successful ultrasound-guided therapeutic paracentesis yielding 300 mL of peritoneal fluid. Performed by: Anders Grant, NP PLAN: If the patient eventually requires >/=2 paracenteses in a 30 day period, candidacy for formal evaluation by the [facility] Interventional Radiology Portal Hypertension Clinic will be assessed. Electronically Signed   By: Judie Petit.  Shick M.D.   On: 05/19/2023 10:33   Medications: Infusions:  cefTRIAXone (ROCEPHIN)  IV 2 g (05/19/23 1144)    Scheduled Medications:  sodium chloride   Intravenous Once   amiodarone  200 mg Oral Daily   ferrous sulfate  325 mg Oral BID WC   folic acid  1 mg Oral Daily   furosemide  40 mg Intravenous Q12H   midodrine  10 mg Oral BID WC   octreotide  200 mcg Subcutaneous Q8H   pantoprazole  40 mg Oral BID   potassium chloride  40 mEq Oral Once   sodium chloride flush  3 mL Intravenous Q12H   tamsulosin  0.4 mg Oral Daily    have reviewed scheduled  and prn medications.  Physical Exam: General: oriented to time and place Heart: irreg Lungs: dec BS at bases Abdomen: soft, non tender  Extremities: pitting edema  GU: male purewick -> now foley    05/20/2023,7:27 AM  LOS: 8 days

## 2023-05-20 NOTE — TOC Progression Note (Addendum)
Transition of Care (TOC) - Progression Note    Patient Details  Name: Cody Hines. MRN: [id number] Date of Birth: [birth year]  Transition of Care [facility]) CM/SW Contact  Mearl Latin, LCSW Phone Number: 05/20/2023, 11:56 AM  Clinical Narrative:    11:56 AM-CSW left another voicemail for [facility] in [place]. CSW spoke with Alvino Chapel in Admissions and she stated she has been given direction today that they are unable to accept patients from hospitals outside of the [facility]. CSW left voicemail for patient's son to make him aware.   2pm-CSW received return call from [facility]; they received referral but are unable to accept patient.   -CSW contacted [facility] but they do not accept short term rehab patients.  -CSW contacted [facility] in [place] but they cannot accept patient.  -CSW left voicemail for [facility] [employer] [facility] [employer]. -[facility] [place] does not accept outside referrals.  -[facility] Tacey Heap can fax referral to f. [phone] per Rosanne Sack.   Expected Discharge Plan: Skilled Nursing Facility Barriers to Discharge: Continued Medical Work up, English as a second language teacher, SNF Pending bed offer  Expected Discharge Plan and Services In-house Referral: Clinical Social Work   Post Acute Care Choice: Skilled Nursing Facility Living arrangements for the past 2 months: Single Family Home                                       Social Determinants of Health (SDOH) Interventions SDOH Screenings   Food Insecurity: No Food Insecurity (05/12/2023)  Housing: Low Risk  (05/12/2023)  Transportation Needs: No Transportation Needs (05/12/2023)  Utilities: Not At Risk (05/12/2023)  Depression (PHQ2-9): Low Risk  (03/28/2023)  Tobacco Use: Medium Risk (05/13/2023)    Readmission Risk Interventions     No data to display

## 2023-05-20 NOTE — Progress Notes (Signed)
PROGRESS NOTE        PATIENT DETAILS Name: Cody Hines. Age: 77 y.o. Sex: male Date of Birth: 02-28-1946 Admit Date: 05/12/2023 Admitting Physician Synetta Fail, MD EAV:[id number], [name], MD  Brief Summary: Patient is a 77 y.o.  male with history of HTN, HLD, PAF on Eliquis, cirrhosis, EtOH use, depression, mild cognitive impairment who was found by significant other confused (had not heard from him for 10 days-separated from wife).  He was found to have severe anemia, AKI, rhabdomyolysis, encephalopathy and subsequently admitted to the hospitalist service.  He was provided with supportive care-including IV fluids/blood transfusion/Rocephin nephrology and gastroenterology followed closely-paracentesis on 8/8 was consistent with SBP.    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  Significant microbiology data: 8/8>> peritoneal fluid culture: No growth  Procedures: 8/8>> paracentesis (WBC 1339 with 79% neutrophils) 05/19/2023.  Repeat ultrasound-guided paracentesis.  Improved and SBP chemistries.  Consults: Nephrology GI  Subjective:  Patient in bed, appears comfortable, denies any headache, no fever, no chest pain or pressure, no shortness of breath , no abdominal pain. No new focal weakness.    Objective: Vitals: Blood pressure (!) 116/57, pulse 73, temperature 98.1 F (36.7 C), temperature source Oral, resp. rate 20, height 6\' 1"  (1.854 m), weight 102.3 kg, SpO2 98%.   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 AKI improving nephrology on board, also had developed some bladder outlet obstruction.  Improved with Foley Flomax as below.    Bladder outlet Obstruction - 1600c on [place] 05/19/23 - foley + flomax.  Hypokalemia  Replaced  Rhabdomyolysis  Likely to be being down for the past several days at home, improved.   Normocytic anemia with some subacute GI blood loss with heme positive stool.  Etiology unclear-probably some element of AKI/acute illness contributing-could have had a transient GI bleed at home (history of cirrhosis)-does have heme positive stools and GI following, 4 units of packed RBC transfusion this admission, GI on board EGD on 05/21/2023.  Acute metabolic encephalopathy Likely due to AKI Seems to have improved significantly. Neuroimaging negative Ammonia stable on 8/6  Spontaneous bacterial peritonitis Had a benign abdominal exam-minimal ascites by physical exam S/p paracentesis on 8/8-consistent with SBP On IV Rocephin Follow cultures-but negative 05/19/23-day 5, repeat cultures from 05/19/2023 being monitored, cytology stable, GI following  History of alcoholic liver cirrhosis Supportive care Unclear if he still consumes alcohol Diuretics on hold Continue lactulose  HTN BP stable Losartan/Cardizem/metoprolol/diuretics remain on hold  PAF Sinus rhythm Amiodarone Eliquis on hold due to severity anemia-concern for GI bleed Telemetry monitoring  HLD Hold statin given rhabdo  OSA CPAP nightly when mentation better  Depression Hold Zoloft/lorazepam for  now  Debility/deconditioning PT/OT/SLP eval May require SNF  BMI: Estimated body mass index is 29.76 kg/m as calculated from the following:   Height as of this encounter: 6\' 1"  (1.854 m).   Weight as of this encounter: 102.3 kg.   Code status:   Code Status: Full Code   DVT Prophylaxis: Place and maintain sequential compression device Start: 05/16/23 0800 SCDs Start: 05/12/23 1427    Family Communication: Son-[phone]-updated 8/9   Disposition Plan: Status is: Inpatient Remains inpatient appropriate because: Severity of illness   Planned Discharge Destination:Skilled nursing facility-when medically stable-currently not ready for discharge.   Diet: Diet Order             Diet 2 gram sodium Room service appropriate? Yes; Fluid consistency: Thin  Diet effective now                     Antimicrobial agents: Anti-infectives (From admission, onward)    Start     Dose/Rate Route Frequency Ordered Stop   05/15/23 1100  cefTRIAXone (ROCEPHIN) 2 g in sodium chloride 0.9 % 100 mL IVPB        2 g 200 mL/hr over 30 Minutes Intravenous Every 24 hours 05/14/23 1513     05/13/23 1100  cefTRIAXone (ROCEPHIN) 1 g in sodium chloride 0.9 % 100 mL IVPB  Status:  Discontinued        1 g 200 mL/hr over 30 Minutes Intravenous Every 24 hours 05/13/23 0949 05/14/23 1513        MEDICATIONS: Scheduled Meds:  sodium chloride   Intravenous Once   amiodarone  200 mg Oral Daily   ferrous sulfate  325 mg Oral BID WC   folic acid  1 mg Oral Daily   furosemide  40 mg Intravenous Q12H   midodrine  10 mg Oral BID WC   octreotide  200 mcg Subcutaneous Q8H   pantoprazole  40 mg Oral BID   potassium chloride  40 mEq Oral BID   sodium chloride flush  3 mL Intravenous Q12H   tamsulosin  0.4 mg Oral Daily   Continuous Infusions:  cefTRIAXone (ROCEPHIN)  IV 2 g (05/19/23 1144)   PRN Meds:.acetaminophen **OR** acetaminophen, naLOXone (NARCAN)  injection   I have  personally reviewed following labs and imaging studies  LABORATORY DATA: CBC: Recent Labs  Lab 05/16/23 0205 05/16/23 1436 05/17/23 0244 05/18/23 0204 05/19/23 0639 05/20/23 0020  WBC 6.7  --  6.9 8.0 8.2 8.0  NEUTROABS 4.7  --  4.5 5.3 5.4 5.6  HGB 6.9* 9.2* 9.1* 9.7* 10.4* 9.9*  HCT 22.0* 28.6* 28.8* 30.8* 32.6* 30.8*  MCV 86.3  --  86.7 87.5 88.6 88.3  PLT 92*  --  95* 102* 113* 105*    Basic Metabolic Panel: Recent Labs  Lab 05/14/23 0001 05/14/23 0847 05/15/23 0039 05/16/23 0205 05/17/23 0244 05/18/23 0204 05/19/23 0639 05/20/23 0020  NA 135   < > 133* 136 136 137 139 138  K 2.5*   < > 3.0* 3.3* 3.3* 3.3* 3.2* 3.3*  CL 97*   < > 100 102 100 100 104 102  CO2 23   < > 22 20* 21* 21* 22 22  GLUCOSE 143*   < > 114* 125* 123* 119* 116* 147*  BUN 70*   < > 71* 68* 66* 64* 60* 60*  CREATININE 9.57*   < > 10.02* 9.77* 9.30* 8.78* 8.36* 8.11*  CALCIUM 7.1*   < > 7.5* 8.1* 8.5* 8.4* 8.6*  8.4*  MG 2.1  --  2.1  --   --   --   --   --   PHOS 5.5*  --   --  5.5* 5.5* 4.8* 5.3* 4.4   < > = values in this interval not displayed.    GFR: Estimated Creatinine Clearance: 10 mL/min (A) (by C-G formula based on SCr of 8.11 mg/dL (H)).  Liver Function Tests: Recent Labs  Lab 05/14/23 0001 05/15/23 0039 05/16/23 0205 05/17/23 0244 05/18/23 0204 05/19/23 0639 05/20/23 0020  AST 76* 46*  --   --   --   --   --   ALT 38 30  --   --   --   --   --   ALKPHOS 52 48  --   --   --   --   --   BILITOT 1.9* 1.9*  --   --   --   --   --   PROT 5.6* 5.5*  --   --   --   --   --   ALBUMIN 2.7* 3.0* 3.5 3.3* 3.2* 3.2* 2.9*   No results for input(s): "LIPASE", "AMYLASE" in the last 168 hours. No results for input(s): "AMMONIA" in the last 168 hours.   Coagulation Profile: Recent Labs  Lab 05/13/23 1002  INR 1.6*    Cardiac Enzymes: Recent Labs  Lab 05/14/23 0001 05/15/23 0039  CKTOTAL 1,504* 601*   Anemia Panel: No results for input(s): "VITAMINB12", "FOLATE",  "FERRITIN", "TIBC", "IRON", "RETICCTPCT" in the last 72 hours.   Urine analysis:    Component Value Date/Time   COLORURINE YELLOW 05/13/2023 1522   APPEARANCEUR HAZY (A) 05/13/2023 1522   LABSPEC 1.011 05/13/2023 1522   PHURINE 5.0 05/13/2023 1522   GLUCOSEU NEGATIVE 05/13/2023 1522   HGBUR LARGE (A) 05/13/2023 1522   BILIRUBINUR NEGATIVE 05/13/2023 1522   KETONESUR NEGATIVE 05/13/2023 1522   PROTEINUR 30 (A) 05/13/2023 1522   UROBILINOGEN 0.2 05/05/2014 1114   NITRITE NEGATIVE 05/13/2023 1522   LEUKOCYTESUR NEGATIVE 05/13/2023 1522    Sepsis Labs: Lactic Acid, Venous    Component Value Date/Time   LATICACIDVEN 2.4 (HH) 06/22/2021 1519    MICROBIOLOGY: Recent Results (from the past 240 hour(s))  Gram stain     Status: None   Collection Time: 05/14/23 11:52 AM   Specimen: Abdomen; Peritoneal Fluid  Result Value Ref Range Status   Specimen Description PERITONEAL  Final   Special Requests NONE  Final   Gram Stain   Final    FEW WBC PRESENT, PREDOMINANTLY PMN NO ORGANISMS SEEN Performed at [facility], [address], [place], [place] [address]    Report Status 05/14/2023 FINAL  Final  Culture, body fluid w Gram Stain-bottle     Status: None   Collection Time: 05/14/23 11:52 AM   Specimen: Peritoneal Washings  Result Value Ref Range Status   Specimen Description PERITONEAL  Final   Special Requests NONE  Final   Culture   Final    NO GROWTH 5 DAYS Performed at [facility], [address], [place], [place] [address]    Report Status 05/19/2023 FINAL  Final  Culture, body fluid w Gram Stain-bottle     Status: None (Preliminary result)   Collection Time: 05/19/23  9:46 AM   Specimen: Peritoneal Washings  Result Value Ref Range Status   Specimen Description PERITONEAL  Final   Special Requests NONE  Final   Culture   Final  NO GROWTH < 24 HOURS Performed at [facility], [address] [place], [place] [address]    Report Status PENDING   Incomplete  Gram stain     Status: None   Collection Time: 05/19/23  9:46 AM   Specimen: Peritoneal Washings  Result Value Ref Range Status   Specimen Description PERITONEAL  Final   Special Requests NONE  Final   Gram Stain   Final    NO WBC SEEN NO ORGANISMS SEEN Performed at [facility], [address], [place], [place] [address]    Report Status 05/19/2023 FINAL  Final    RADIOLOGY STUDIES/RESULTS: IR Paracentesis  Result Date: 05/19/2023 INDICATION: 77 year old male. 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 Request for diagnostic paracentesis. EXAM: ULTRASOUND GUIDED DIAGNOSTIC PARACENTESIS MEDICATIONS: Lidocaine 1% 10 mL COMPLICATIONS: None immediate. PROCEDURE: Informed written consent was obtained from the patient after a discussion of the risks, benefits and alternatives to treatment. A timeout was performed prior to the initiation of the procedure. Initial ultrasound scanning demonstrates a small amount of ascites within the right lower abdominal quadrant. The right lower abdomen was prepped and draped in the usual sterile fashion. 1% lidocaine was used for local anesthesia. Following this, a 19 gauge, 7-cm, Yueh catheter was introduced. An ultrasound image was saved for documentation purposes. The paracentesis was performed. The catheter was removed and a dressing was applied. The patient tolerated the procedure well without immediate post procedural complication. FINDINGS: A total of approximately 300 mL of amber colored fluid was removed. Samples were sent to the laboratory as requested by the clinical team. IMPRESSION: Successful ultrasound-guided therapeutic paracentesis yielding 300 mL of peritoneal fluid. Performed by: Anders Grant, NP PLAN: If the patient eventually requires >/=2 paracenteses in a 30 day period, candidacy for formal evaluation by the [facility] Interventional Radiology Portal Hypertension Clinic will be assessed.  Electronically Signed   By: Judie Petit.  Shick M.D.   On: 05/19/2023 10:33     LOS: 8 days   Signature  -    Susa Raring M.D on 05/20/2023 at 8:38 AM   -  To page go to [email or web address]

## 2023-05-21 ENCOUNTER — Inpatient Hospital Stay (HOSPITAL_COMMUNITY): Payer: Medicare Other | Admitting: Certified Registered"

## 2023-05-21 ENCOUNTER — Encounter (HOSPITAL_COMMUNITY): Payer: Self-pay | Admitting: Internal Medicine

## 2023-05-21 ENCOUNTER — Encounter (HOSPITAL_COMMUNITY)
Admission: EM | Disposition: A | Discharge status: Skilled Nursing Facility | Payer: Self-pay | Source: Home / Self Care | Attending: Internal Medicine

## 2023-05-21 DIAGNOSIS — N179 Acute kidney failure, unspecified: Secondary | ICD-10-CM | POA: Diagnosis not present

## 2023-05-21 DIAGNOSIS — G4733 Obstructive sleep apnea (adult) (pediatric): Secondary | ICD-10-CM

## 2023-05-21 DIAGNOSIS — I129 Hypertensive chronic kidney disease with stage 1 through stage 4 chronic kidney disease, or unspecified chronic kidney disease: Secondary | ICD-10-CM | POA: Diagnosis not present

## 2023-05-21 DIAGNOSIS — K2970 Gastritis, unspecified, without bleeding: Secondary | ICD-10-CM

## 2023-05-21 DIAGNOSIS — R195 Other fecal abnormalities: Secondary | ICD-10-CM | POA: Diagnosis not present

## 2023-05-21 DIAGNOSIS — K7460 Unspecified cirrhosis of liver: Secondary | ICD-10-CM

## 2023-05-21 DIAGNOSIS — N183 Chronic kidney disease, stage 3 unspecified: Secondary | ICD-10-CM | POA: Diagnosis not present

## 2023-05-21 DIAGNOSIS — K2990 Gastroduodenitis, unspecified, without bleeding: Secondary | ICD-10-CM | POA: Diagnosis not present

## 2023-05-21 HISTORY — PX: ESOPHAGOGASTRODUODENOSCOPY (EGD) WITH PROPOFOL: SHX5813

## 2023-05-21 HISTORY — PX: BIOPSY: SHX5522

## 2023-05-21 LAB — RENAL FUNCTION PANEL
Albumin: 3 g/dL — ABNORMAL LOW (ref 3.5–5.0)
Anion gap: 13 (ref 5–15)
BUN: 49 mg/dL — ABNORMAL HIGH (ref 8–23)
CO2: 24 mmol/L (ref 22–32)
Calcium: 8.4 mg/dL — ABNORMAL LOW (ref 8.9–10.3)
Chloride: 102 mmol/L (ref 98–111)
Creatinine, Ser: 6.77 mg/dL — ABNORMAL HIGH (ref 0.61–1.24)
GFR, Estimated: 8 mL/min — ABNORMAL LOW (ref >60)
Glucose, Bld: 120 mg/dL — ABNORMAL HIGH (ref 70–99)
Phosphorus: 3.2 mg/dL (ref 2.5–4.6)
Potassium: 3.8 mmol/L (ref 3.5–5.1)
Sodium: 139 mmol/L (ref 135–145)

## 2023-05-21 LAB — CBC WITH DIFFERENTIAL/PLATELET
Abs Immature Granulocytes: 0.03 10*3/uL (ref 0.00–0.07)
Basophils Absolute: 0 10*3/uL (ref 0.0–0.1)
Basophils Relative: 0 %
Eosinophils Absolute: 0.2 10*3/uL (ref 0.0–0.5)
Eosinophils Relative: 3 %
HCT: 31.3 % — ABNORMAL LOW (ref 39.0–52.0)
Hemoglobin: 10.1 g/dL — ABNORMAL LOW (ref 13.0–17.0)
Immature Granulocytes: 0 %
Lymphocytes Relative: 18 %
Lymphs Abs: 1.3 10*3/uL (ref 0.7–4.0)
MCH: 27.9 pg (ref 26.0–34.0)
MCHC: 32.3 g/dL (ref 30.0–36.0)
MCV: 86.5 fL (ref 80.0–100.0)
Monocytes Absolute: 0.6 10*3/uL (ref 0.1–1.0)
Monocytes Relative: 8 %
Neutro Abs: 5.2 10*3/uL (ref 1.7–7.7)
Neutrophils Relative %: 71 %
Platelets: 104 10*3/uL — ABNORMAL LOW (ref 150–400)
RBC: 3.62 MIL/uL — ABNORMAL LOW (ref 4.22–5.81)
RDW: 15.6 % — ABNORMAL HIGH (ref 11.5–15.5)
WBC: 7.4 10*3/uL (ref 4.0–10.5)
nRBC: 0 % (ref 0.0–0.2)

## 2023-05-21 SURGERY — ESOPHAGOGASTRODUODENOSCOPY (EGD) WITH PROPOFOL
Anesthesia: Monitor Anesthesia Care

## 2023-05-21 MED ORDER — LIDOCAINE 2% (20 MG/ML) 5 ML SYRINGE
INTRAMUSCULAR | Status: DC | PRN
  Administered 2023-05-21: 30 mg via INTRAVENOUS

## 2023-05-21 MED ORDER — PROPOFOL 500 MG/50ML IV EMUL
INTRAVENOUS | Status: DC | PRN
  Administered 2023-05-21: 80 ug/kg/min via INTRAVENOUS

## 2023-05-21 MED ORDER — LACTATED RINGERS IV SOLN: INTRAVENOUS | Status: DC

## 2023-05-21 MED ORDER — PROPOFOL 10 MG/ML IV BOLUS
INTRAVENOUS | Status: DC | PRN
Start: 2023-05-21 — End: 2023-05-21
  Administered 2023-05-21: 50 mg via INTRAVENOUS
  Administered 2023-05-21: 20 mg via INTRAVENOUS
  Administered 2023-05-21: 40 mg via INTRAVENOUS

## 2023-05-21 SURGICAL SUPPLY — 15 items

## 2023-05-21 NOTE — Progress Notes (Signed)
PROGRESS NOTE        PATIENT DETAILS Name: Cody Hines. Age: 77 y.o. Sex: male Date of Birth: 11-Mar-1946 Admit Date: 05/12/2023 Admitting Physician Synetta Fail, MD [id number], [name], MD  Brief Summary: Patient is a 77 y.o.  male with history of HTN, HLD, PAF on Eliquis, cirrhosis, EtOH use, depression, mild cognitive impairment who was found by significant other confused (had not heard from him for 10 days-separated from wife).  He was found to have severe anemia, AKI, rhabdomyolysis, encephalopathy and subsequently admitted to the hospitalist service.  He was provided with supportive care-including IV fluids/blood transfusion/Rocephin nephrology and gastroenterology followed closely-paracentesis on 8/8 was consistent with SBP.    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  Significant microbiology data: 8/8>> peritoneal fluid culture: No growth  Procedures: 8/8>> paracentesis (WBC 1339 with 79% neutrophils) 05/19/2023.  Repeat ultrasound-guided paracentesis.  Improved and SBP chemistries.  Consults: Nephrology GI  Subjective:  Patient in bed, appears comfortable, denies any headache, no fever, no chest pain or pressure, no shortness of breath , no abdominal pain. No new focal weakness.   Objective: Vitals: Blood pressure 137/63, pulse 72, temperature 98.2 F (36.8 C), temperature source Temporal, resp. rate (!) 21, height 6\' 1"  (1.854 m), weight 103.2 kg, SpO2 94%.   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 AKI improving nephrology on board, also had developed some bladder outlet obstruction.  Improved with Foley Flomax as below.    Bladder outlet Obstruction - 1600c on [place] 05/19/23 - foley + flomax.  Hypokalemia  Replaced  Rhabdomyolysis  Likely to be being down for the past several days at home, improved.   Normocytic anemia with some subacute GI blood loss with heme positive stool.  Etiology unclear-probably some element of AKI/acute illness contributing-could have had a transient GI bleed at home (history of cirrhosis)-does have heme positive stools and GI following, 4 units of packed RBC transfusion this admission, GI on board EGD on 05/21/2023.  Acute metabolic encephalopathy - Likely due to AKI, resolved, Neuroimaging negative  Spontaneous bacterial peritonitis Had a benign abdominal exam-minimal ascites by physical exam S/p paracentesis on 8/8-consistent with SBP On IV Rocephin Follow cultures-but negative 05/19/23-day 5, repeat cultures from 05/19/2023 being monitored, cytology stable, GI following  History of alcoholic liver cirrhosis Supportive care Unclear if he still consumes alcohol Diuretics on hold Continue lactulose  HTN BP stable Losartan/Cardizem/metoprolol/diuretics remain on hold  PAF Sinus rhythm Amiodarone Eliquis on hold due to severity anemia-concern for GI bleed Telemetry monitoring  HLD Hold statin given rhabdo  OSA CPAP nightly when mentation better  Depression Hold Zoloft/lorazepam for now  Debility/deconditioning PT/OT/SLP eval May require  SNF  BMI: Estimated body mass index is 30.02 kg/m as calculated from the following:   Height as of this encounter: 6\' 1"  (1.854 m).   Weight as of this encounter: 103.2 kg.   Code status:   Code Status: Full Code   DVT Prophylaxis: Place and maintain sequential compression device Start: 05/16/23 0800 SCDs Start: 05/12/23 1427    Family Communication: Son-[phone]-updated 8/9   Disposition Plan: Status is: Inpatient Remains inpatient appropriate because: Severity of illness   Planned Discharge Destination:Skilled nursing facility-when medically stable-currently not ready for discharge.   Diet: Diet Order             Diet NPO time specified Except for: Sips with Meds  Diet effective midnight                     Antimicrobial agents: Anti-infectives (From admission, onward)    Start     Dose/Rate Route Frequency Ordered Stop   05/15/23 1100  [MAR Hold]  cefTRIAXone (ROCEPHIN) 2 g in sodium chloride 0.9 % 100 mL IVPB        (MAR Hold since Thu 05/21/2023 at 0901.Hold Reason: Transfer to a Procedural area)   2 g 200 mL/hr over 30 Minutes Intravenous Every 24 hours 05/14/23 1513     05/13/23 1100  cefTRIAXone (ROCEPHIN) 1 g in sodium chloride 0.9 % 100 mL IVPB  Status:  Discontinued        1 g 200 mL/hr over 30 Minutes Intravenous Every 24 hours 05/13/23 0949 05/14/23 1513        MEDICATIONS: Scheduled Meds:  [MAR Hold] sodium chloride   Intravenous Once   [MAR Hold] amiodarone  200 mg Oral Daily   [MAR Hold] Chlorhexidine Gluconate Cloth  6 each Topical Daily   [MAR Hold] ferrous sulfate  325 mg Oral BID WC   [MAR Hold] folic acid  1 mg Oral Daily   [MAR Hold] furosemide  40 mg Intravenous Q12H   [MAR Hold] midodrine  10 mg Oral BID WC   [MAR Hold] octreotide  200 mcg Subcutaneous Q8H   [MAR Hold] pantoprazole  40 mg Oral BID   [MAR Hold] potassium chloride  40 mEq Oral BID   [MAR Hold] sodium chloride flush  3 mL Intravenous Q12H   [MAR  Hold] tamsulosin  0.4 mg Oral Daily   Continuous Infusions:  [MAR Hold] cefTRIAXone (ROCEPHIN)  IV 2 g (05/20/23 0956)   lactated ringers     PRN Meds:.[MAR Hold] acetaminophen **OR** [MAR Hold] acetaminophen, [MAR Hold] naLOXone (NARCAN)  injection   I have personally reviewed following labs and imaging studies  LABORATORY DATA: CBC: Recent Labs  Lab 05/17/23 0244 05/18/23 0204 05/19/23 0639 05/20/23 0020 05/21/23 0657  WBC 6.9 8.0 8.2 8.0 7.4  NEUTROABS 4.5 5.3 5.4 5.6 5.2  HGB 9.1* 9.7* 10.4* 9.9* 10.1*  HCT 28.8* 30.8* 32.6* 30.8* 31.3*  MCV 86.7 87.5 88.6 88.3 86.5  PLT 95* 102* 113* 105* 104*    Basic Metabolic Panel: Recent Labs  Lab 05/15/23 0039 05/16/23 0205 05/17/23 0244 05/18/23 0204 05/19/23 0639 05/20/23 0020 05/21/23 0228  NA 133*   < > 136 137 139 138 139  K 3.0*   < > 3.3* 3.3* 3.2* 3.3* 3.8  CL 100   < > 100 100 104 102 102  CO2 22   < > 21* 21* 22 22 24   GLUCOSE 114*   < > 123* 119* 116* 147* 120*  BUN 71*   < >  66* 64* 60* 60* 49*  CREATININE 10.02*   < > 9.30* 8.78* 8.36* 8.11* 6.77*  CALCIUM 7.5*   < > 8.5* 8.4* 8.6* 8.4* 8.4*  MG 2.1  --   --   --   --   --   --   PHOS  --    < > 5.5* 4.8* 5.3* 4.4 3.2   < > = values in this interval not displayed.    GFR: Estimated Creatinine Clearance: 12.1 mL/min (A) (by C-G formula based on SCr of 6.77 mg/dL (H)).  Liver Function Tests: Recent Labs  Lab 05/15/23 0039 05/16/23 0205 05/17/23 0244 05/18/23 0204 05/19/23 0639 05/20/23 0020 05/21/23 0228  AST 46*  --   --   --   --   --   --   ALT 30  --   --   --   --   --   --   ALKPHOS 48  --   --   --   --   --   --   BILITOT 1.9*  --   --   --   --   --   --   PROT 5.5*  --   --   --   --   --   --   ALBUMIN 3.0*   < > 3.3* 3.2* 3.2* 2.9* 3.0*   < > = values in this interval not displayed.   No results for input(s): "LIPASE", "AMYLASE" in the last 168 hours. No results for input(s): "AMMONIA" in the last 168 hours.   Coagulation  Profile: No results for input(s): "INR", "PROTIME" in the last 168 hours.   Cardiac Enzymes: Recent Labs  Lab 05/15/23 0039  CKTOTAL 601*   Anemia Panel: No results for input(s): "VITAMINB12", "FOLATE", "FERRITIN", "TIBC", "IRON", "RETICCTPCT" in the last 72 hours.   Urine analysis:    Component Value Date/Time   COLORURINE YELLOW 05/13/2023 1522   APPEARANCEUR HAZY (A) 05/13/2023 1522   LABSPEC 1.011 05/13/2023 1522   PHURINE 5.0 05/13/2023 1522   GLUCOSEU NEGATIVE 05/13/2023 1522   HGBUR LARGE (A) 05/13/2023 1522   BILIRUBINUR NEGATIVE 05/13/2023 1522   KETONESUR NEGATIVE 05/13/2023 1522   PROTEINUR 30 (A) 05/13/2023 1522   UROBILINOGEN 0.2 05/05/2014 1114   NITRITE NEGATIVE 05/13/2023 1522   LEUKOCYTESUR NEGATIVE 05/13/2023 1522    Sepsis Labs: Lactic Acid, Venous    Component Value Date/Time   LATICACIDVEN 2.4 (HH) 06/22/2021 1519    MICROBIOLOGY: Recent Results (from the past 240 hour(s))  Gram stain     Status: None   Collection Time: 05/14/23 11:52 AM   Specimen: Abdomen; Peritoneal Fluid  Result Value Ref Range Status   Specimen Description PERITONEAL  Final   Special Requests NONE  Final   Gram Stain   Final    FEW WBC PRESENT, PREDOMINANTLY PMN NO ORGANISMS SEEN Performed at [facility], [address], [place], [place] [address]    Report Status 05/14/2023 FINAL  Final  Culture, body fluid w Gram Stain-bottle     Status: None   Collection Time: 05/14/23 11:52 AM   Specimen: Peritoneal Washings  Result Value Ref Range Status   Specimen Description PERITONEAL  Final   Special Requests NONE  Final   Culture   Final    NO GROWTH 5 DAYS Performed at [facility], [address] [place], [place] [address]    Report Status 05/19/2023 FINAL  Final  Culture, body fluid w  Gram Stain-bottle     Status: None (Preliminary result)   Collection Time: 05/19/23  9:46 AM   Specimen: Peritoneal Washings  Result Value Ref Range Status   Specimen  Description PERITONEAL  Final   Special Requests NONE  Final   Culture   Final    NO GROWTH 2 DAYS Performed at [facility], [address], [place], [place] [address]    Report Status PENDING  Incomplete  Gram stain     Status: None   Collection Time: 05/19/23  9:46 AM   Specimen: Peritoneal Washings  Result Value Ref Range Status   Specimen Description PERITONEAL  Final   Special Requests NONE  Final   Gram Stain   Final    NO WBC SEEN NO ORGANISMS SEEN Performed at [facility], [address], [place], [place] [address]    Report Status 05/19/2023 FINAL  Final    RADIOLOGY STUDIES/RESULTS: IR Paracentesis  Result Date: 05/19/2023 INDICATION: 77 year old male. 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 Request for diagnostic paracentesis. EXAM: ULTRASOUND GUIDED DIAGNOSTIC PARACENTESIS MEDICATIONS: Lidocaine 1% 10 mL COMPLICATIONS: None immediate. PROCEDURE: Informed written consent was obtained from the patient after a discussion of the risks, benefits and alternatives to treatment. A timeout was performed prior to the initiation of the procedure. Initial ultrasound scanning demonstrates a small amount of ascites within the right lower abdominal quadrant. The right lower abdomen was prepped and draped in the usual sterile fashion. 1% lidocaine was used for local anesthesia. Following this, a 19 gauge, 7-cm, Yueh catheter was introduced. An ultrasound image was saved for documentation purposes. The paracentesis was performed. The catheter was removed and a dressing was applied. The patient tolerated the procedure well without immediate post procedural complication. FINDINGS: A total of approximately 300 mL of amber colored fluid was removed. Samples were sent to the laboratory as requested by the clinical team. IMPRESSION: Successful ultrasound-guided therapeutic paracentesis yielding 300 mL of peritoneal fluid. Performed by: Anders Grant,  NP PLAN: If the patient eventually requires >/=2 paracenteses in a 30 day period, candidacy for formal evaluation by the [facility] Interventional Radiology Portal Hypertension Clinic will be assessed. Electronically Signed   By: Judie Petit.  Shick M.D.   On: 05/19/2023 10:33     LOS: 9 days   Signature  -    Susa Raring M.D on 05/21/2023 at 9:26 AM   -  To page go to [email or web address]

## 2023-05-21 NOTE — TOC Progression Note (Signed)
Transition of Care (TOC) - Progression Note    Patient Details  Name: Cody Hines. MRN: [id number] Date of Birth: 01/22/1946  Transition of Care [facility]) CM/SW Contact  Mearl Latin, LCSW Phone Number: 05/21/2023, 3:35 PM  Clinical Narrative:    12pm-Son requested CSW proceed with referral to [facility]. CSW can faxed referral to f. [phone] and spoke with Rosanne Sack who will review.    Expected Discharge Plan: Skilled Nursing Facility Barriers to Discharge: Continued Medical Work up, English as a second language teacher, SNF Pending bed offer  Expected Discharge Plan and Services In-house Referral: Clinical Social Work   Post Acute Care Choice: Skilled Nursing Facility Living arrangements for the past 2 months: Single Family Home                                       Social Determinants of Health (SDOH) Interventions SDOH Screenings   Food Insecurity: No Food Insecurity (05/12/2023)  Housing: Low Risk  (05/12/2023)  Transportation Needs: No Transportation Needs (05/12/2023)  Utilities: Not At Risk (05/12/2023)  Depression (PHQ2-9): Low Risk  (03/28/2023)  Tobacco Use: Medium Risk (05/21/2023)    Readmission Risk Interventions     No data to display

## 2023-05-21 NOTE — Plan of Care (Signed)
  Problem: Education: Goal: Knowledge of General Education information will improve Description: Including pain rating scale, medication(s)/side effects and non-pharmacologic comfort measures Outcome: Progressing   Problem: Clinical Measurements: Goal: Will remain free from infection Outcome: Progressing Goal: Respiratory complications will improve Outcome: Progressing Goal: Cardiovascular complication will be avoided Outcome: Progressing   Problem: Activity: Goal: Risk for activity intolerance will decrease Outcome: Progressing   

## 2023-05-21 NOTE — Progress Notes (Signed)
Assessment/Plan: 77 year old WM with cirrhosis , HFrEF, afib on appropriate meds-  also stage 3 CKD- and solitary functioning kidney  now presents found down with AKI  1.Renal- baseline CKD-  crt 1.4-1.6-  last checked in May of 24.  Now with A on CRF in the setting of being found down, rhabdo, hypotension on ARB.  Renal US showed solitary functioning kidney without hydro-  urine pretty bland -  s/p  aggressive hydration- CK down ,   holding ARB- no absolute indications for RRT-  not great that crt has not improved-  possibly established ATN but making urine now so that is better.  edematous -  IVF stopped.    Now in the differential is HRS so has been started on appropriate treatment for that  Renal function continues to improve w/ good UOP.  Continue Lasix 40mg  BID. Foley in place for high PVR on Tamsulosin.  Not much else to add from the renal standpoint. Signing off at this time; please reconsult as needed.  2. Hypertension/volume  - dry and hypotensive- initially treated with aggressive hydration and then stopped  ivf as he was becoming more edematous.    3. Hypokalemia-  replete as needed -> will place on standing PO BID especially with the high UOP with foley. 4. Anemia  - is pretty low to be CKD related-  suspecting a GIB-  transfused 2 units on 8/6-   another unit 8/10  5. GI-  GI on board-  treating him for SBP and also giving lactulose     Subjective:  oriented to place and time; states he's starting to feel better but poor appetite still (no nausea)-   UOP better w/ the foley.  Objective Vital signs in last 24 hours: Vitals:   05/20/23 1158 05/20/23 1640 05/20/23 2100 05/21/23 0000  BP: 114/62  (!) 118/56 (!) 117/53  Pulse: 74  69 71  Resp: 17  (!) 23 20  Temp: 98.6 F (37 C) 98.3 F (36.8 C) 98.2 F (36.8 C) (!) 97.3 F (36.3 C)  TempSrc: Oral Oral Oral Oral  SpO2: 95%  94% 98%  Weight:      Height:       Weight change:   Intake/Output Summary (Last 24 hours)  at 05/21/2023 0716 Last data filed at 05/21/2023 0500 Gross per 24 hour  Intake --  Output 2700 ml  Net -2700 ml       ,  W    Labs: Basic Metabolic Panel: Recent Labs  Lab 05/19/23 0639 05/20/23 0020 05/21/23 0228  NA 139 138 139  K 3.2* 3.3* 3.8  CL 104 102 102  CO2 22 22 24   GLUCOSE 116* 147* 120*  BUN 60* 60* 49*  CREATININE 8.36* 8.11* 6.77*  CALCIUM 8.6* 8.4* 8.4*  PHOS 5.3* 4.4 3.2   Liver Function Tests: Recent Labs  Lab 05/15/23 0039 05/16/23 0205 05/19/23 0639 05/20/23 0020 05/21/23 0228  AST 46*  --   --   --   --   ALT 30  --   --   --   --   ALKPHOS 48  --   --   --   --   BILITOT 1.9*  --   --   --   --   PROT 5.5*  --   --   --   --   ALBUMIN 3.0*   < > 3.2* 2.9* 3.0*   < > = values in this interval not  displayed.   No results for input(s): "LIPASE", "AMYLASE" in the last 168 hours. No results for input(s): "AMMONIA" in the last 168 hours.  CBC: Recent Labs  Lab 05/16/23 0205 05/16/23 1436 05/17/23 0244 05/18/23 0204 05/19/23 0639 05/20/23 0020  WBC 6.7  --  6.9 8.0 8.2 8.0  NEUTROABS 4.7  --  4.5 5.3 5.4 5.6  HGB 6.9*   < > 9.1* 9.7* 10.4* 9.9*  HCT 22.0*   < > 28.8* 30.8* 32.6* 30.8*  MCV 86.3  --  86.7 87.5 88.6 88.3  PLT 92*  --  95* 102* 113* 105*   < > = values in this interval not displayed.   Cardiac Enzymes: Recent Labs  Lab 05/15/23 0039  CKTOTAL 601*   CBG: No results for input(s): "GLUCAP" in the last 168 hours.  Iron Studies:  No results for input(s): "IRON", "TIBC", "TRANSFERRIN", "FERRITIN" in the last 72 hours.  Studies/Results: IR Paracentesis  Result Date: 05/19/2023 INDICATION: 77 year old male. 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 Request for diagnostic paracentesis. EXAM: ULTRASOUND GUIDED DIAGNOSTIC PARACENTESIS MEDICATIONS: Lidocaine 1% 10 mL COMPLICATIONS: None immediate. PROCEDURE: Informed written consent was obtained from the patient after a discussion  of the risks, benefits and alternatives to treatment. A timeout was performed prior to the initiation of the procedure. Initial ultrasound scanning demonstrates a small amount of ascites within the right lower abdominal quadrant. The right lower abdomen was prepped and draped in the usual sterile fashion. 1% lidocaine was used for local anesthesia. Following this, a 19 gauge, 7-cm, Yueh catheter was introduced. An ultrasound image was saved for documentation purposes. The paracentesis was performed. The catheter was removed and a dressing was applied. The patient tolerated the procedure well without immediate post procedural complication. FINDINGS: A total of approximately 300 mL of amber colored fluid was removed. Samples were sent to the laboratory as requested by the clinical team. IMPRESSION: Successful ultrasound-guided therapeutic paracentesis yielding 300 mL of peritoneal fluid. Performed by: Anders Grant, NP PLAN: If the patient eventually requires >/=2 paracenteses in a 30 day period, candidacy for formal evaluation by the [facility] will be assessed. Electronically Signed   By: Judie Petit.  Shick M.D.   On: 05/19/2023 10:33   Medications: Infusions:  cefTRIAXone (ROCEPHIN)  IV 2 g (05/20/23 0956)    Scheduled Medications:  sodium chloride   Intravenous Once   amiodarone  200 mg Oral Daily   Chlorhexidine Gluconate Cloth  6 each Topical Daily   ferrous sulfate  325 mg Oral BID WC   folic acid  1 mg Oral Daily   furosemide  40 mg Intravenous Q12H   midodrine  10 mg Oral BID WC   octreotide  200 mcg Subcutaneous Q8H   pantoprazole  40 mg Oral BID   potassium chloride  40 mEq Oral BID   sodium chloride flush  3 mL Intravenous Q12H   tamsulosin  0.4 mg Oral Daily    have reviewed scheduled and prn medications.  Physical Exam: General: oriented to time and place Heart: irreg Lungs: dec BS at bases Abdomen: soft, non tender  Extremities:  pitting edema  GU: male purewick -> now foley    05/21/2023,7:16 AM  LOS: 9 days

## 2023-05-21 NOTE — Transfer of Care (Signed)
Immediate Anesthesia Transfer of Care Note  Patient: Cody Hines.  Procedure(s) Performed: ESOPHAGOGASTRODUODENOSCOPY (EGD) WITH PROPOFOL BIOPSY  Patient Location: PACU and Endoscopy Unit  Anesthesia Type:MAC  Level of Consciousness: drowsy, patient cooperative, and responds to stimulation  Airway & Oxygen Therapy: Patient Spontanous Breathing and Patient connected to nasal cannula oxygen  Post-op Assessment: Report given to RN and Post -op Vital signs reviewed and stable  Post vital signs: Reviewed and stable  Last Vitals:  Vitals Value Taken Time  BP 109/48 05/21/23 0958  Temp 36.5 C 05/21/23 0958  Pulse 75 05/21/23 0959  Resp 29 05/21/23 0959  SpO2 95 % 05/21/23 0959  Vitals shown include unfiled device data.  Last Pain:  Vitals:   05/21/23 0958  TempSrc: Temporal  PainSc: Asleep      Patients Stated Pain Goal: 0 (05/13/23 2340)  Complications: No notable events documented.

## 2023-05-21 NOTE — Procedures (Addendum)
I spoke to the patient's son Clovis Riley by phone.  We discussed the findings and recommendations after upper endoscopy today  Time provided for questions and answers and he thanked me for the call

## 2023-05-21 NOTE — Op Note (Signed)
[facility] Patient Name: Cody Hines Procedure Date : 05/21/2023 MRN: [id number] Attending MD: Beverley Fiedler , MD, [id number] Date of Birth: 24-Feb-1946 CSN: [id number] Age: 77 Admit Type: Inpatient Procedure:                Upper GI endoscopy Indications:              Heme positive stool, Cirrhosis rule out esophageal                            varices Providers:                Carie Caddy. Rhea Belton, MD, Margaree Mackintosh, RN,                            Cephus Richer, RN, Leanne Lovely, Technician Referring MD:             [facility] Medicines:                Monitored Anesthesia Care Complications:            No immediate complications. Estimated Blood Loss:     Estimated blood loss was minimal. Procedure:                Pre-Anesthesia Assessment:                           - Prior to the procedure, a History and Physical                            was performed, and patient medications and                            allergies were reviewed. The patient's tolerance of                            previous anesthesia was also reviewed. The risks                            and benefits of the procedure and the sedation                            options and risks were discussed with the patient.                            All questions were answered, and informed consent                            was obtained. Prior Anticoagulants: The patient has                            taken Eliquis (apixaban), last dose was 8 days                            prior to procedure. ASA Grade Assessment: III - A  patient with severe systemic disease. After                            reviewing the risks and benefits, the patient was                            deemed in satisfactory condition to undergo the                            procedure.                           After obtaining informed consent, the endoscope was                             passed under direct vision. Throughout the                            procedure, the patient's blood pressure, pulse, and                            oxygen saturations were monitored continuously. The                            [id number]-H190 ([id number]) Olympus endoscope was introduced                            through the mouth, and advanced to the second part                            of duodenum. The upper GI endoscopy was                            accomplished without difficulty. The patient                            tolerated the procedure well. Scope In: Scope Out: Findings:      The examined esophagus was normal. No varices.      Diffuse moderate inflammation characterized by congestion (edema),       erosions, erythema, friability and granularity was found in the entire       examined stomach. Biopsies were taken with a cold forceps for histology       and Helicobacter pylori testing.      Scattered mild inflammation characterized by erythema and nodularity was       found in the duodenal bulb.      The second portion of the duodenum was normal. Impression:               - Normal esophagus.                           - Diffuse gastritis with contact oozing. Some                            component of portal hypertensive gastropathy.  Biopsied.                           - No esophagal or gastric varices.                           - Mild scattered peptic duodenitis without bleeding.                           - Normal second portion of the duodenum. Moderate Sedation:      N/A Recommendation:           - Return patient to hospital ward for ongoing care.                           - Resume previous diet.                           - Continue present medications. Daily BID PPI                            recommended now and at discharge.                           - Await pathology results.                           - Office follow-up with Dr. Adela Lank after                             discharge.                           - Daily SBP prophylaxis per my recommendations in                            yesterday's note.                           - Repeat EGD in 2 years for variceal screening.                           - GI will sign off, but remain available. Please                            call if questions. Procedure Code(s):        --- Professional ---                           [phone], Esophagogastroduodenoscopy, flexible,                            transoral; with biopsy, single or multiple Diagnosis Code(s):        --- Professional ---                           K29.70, Gastritis, unspecified, without bleeding  K29.80, Duodenitis without bleeding                           R19.5, Other fecal abnormalities                           K74.60, Unspecified cirrhosis of liver CPT copyright 2022 American [employer] Association. All rights reserved. The codes documented in this report are preliminary and upon coder review may  be revised to meet current compliance requirements. Beverley Fiedler, MD 05/21/2023 10:07:45 AM This report has been signed electronically. Number of Addenda: 0

## 2023-05-21 NOTE — Anesthesia Preprocedure Evaluation (Signed)
Anesthesia Evaluation  Patient identified by MRN, date of birth, ID band Patient awake    Reviewed: Allergy & Precautions, NPO status , Patient's Chart, lab work & pertinent test results  Airway Mallampati: II  TM Distance: >3 FB Neck ROM: Full    Dental no notable dental hx.    Pulmonary sleep apnea , former smoker   Pulmonary exam normal        Cardiovascular hypertension, Pt. on medications and Pt. on home beta blockers  Rhythm:Regular Rate:Normal  ECHO:   1. Left ventricular ejection fraction, by estimation, is 60 to 65%. The left ventricle has normal function. The left ventricle has no regional wall motion abnormalities. Left ventricular diastolic parameters are consistent with Grade II diastolic dysfunction (pseudonormalization).  2. Right ventricular systolic function is normal. The right ventricular size is normal.  3. The mitral valve is normal in structure. Mild mitral valve regurgitation.  4. The aortic valve is tricuspid. Aortic valve regurgitation is mild.    Neuro/Psych    Depression    negative neurological ROS     GI/Hepatic Neg liver ROS,GERD  ,,  Endo/Other  negative endocrine ROS    Renal/GU   negative genitourinary   Musculoskeletal  (+)  Fibromyalgia -  Abdominal Normal abdominal exam  (+)   Peds  Hematology Lab Results      Component                Value               Date                      WBC                      7.4                 05/21/2023                HGB                      10.1 (L)            05/21/2023                HCT                      31.3 (L)            05/21/2023                MCV                      86.5                05/21/2023                PLT                      104 (L)             05/21/2023             Lab Results      Component                Value               Date  NA                       139                 05/21/2023                 K                        3.8                 05/21/2023                CO2                      24                  05/21/2023                GLUCOSE                  120 (H)             05/21/2023                BUN                      49 (H)              05/21/2023                CREATININE               6.77 (H)            05/21/2023                CALCIUM                  8.4 (L)             05/21/2023                GFR                      40.71 (L)           12/08/2022                EGFR                     44 (L)              03/04/2023                GFRNONAA                 8 (L)               05/21/2023              Anesthesia Other Findings   Reproductive/Obstetrics                             Anesthesia Physical Anesthesia Plan  ASA: 3  Anesthesia Plan: MAC   Post-op Pain Management:    Induction: Intravenous  PONV Risk Score and Plan: 1 and Propofol infusion and Treatment may vary due to age or medical condition  Airway Management Planned: Simple Face Mask and Nasal Cannula  Additional Equipment:  None  Intra-op Plan:   Post-operative Plan:   Informed Consent: I have reviewed the patients History and Physical, chart, labs and discussed the procedure including the risks, benefits and alternatives for the proposed anesthesia with the patient or authorized representative who has indicated his/her understanding and acceptance.     Dental advisory given  Plan Discussed with: CRNA  Anesthesia Plan Comments:        Anesthesia Quick Evaluation

## 2023-05-21 NOTE — Progress Notes (Signed)
Physical Therapy Treatment Patient Details Name: Cody Hines. MRN: [id number] DOB: 1946-08-31 Today's Date: 05/21/2023   History of Present Illness 77 yo male presenting to ED 8/6 with AMS. Found by ex-wife confused in his bed, incontinent of stool. CTH negative. Rt hip and Lt elbow xrays negative. +AKI, dehydration, hypotension, hypokalemia, Hgb 5.8 (transfused 2 units)  PMH  significant of HTN, OSA on CPAP, MCI, multiple spinal compression fxs, CKD, afib, cirrhosis, fibromyalgia, mild cognitive impairment    PT Comments  Currently pt is Min A for bed mobility and sit to stand. He was able to progress gait this session with very low foot clearance of the LLE with AD, with crouched knees today and very short steps requiring wgt shifting and moderate verbal/tactile cues from physical therapist. Pt is currently at a very high risk for falls. Due to pt current functional status, home set up and available assistance at home recommending skilled physical therapy services at a higher level of care and frequency (5x/weekly) on discharge from acute care hospital setting in order to decrease risk for falls, immobility, skin breakdown, injury and re-hospitalization.     If plan is discharge home, recommend the following: A little help with walking and/or transfers;A little help with bathing/dressing/bathroom;Assistance with cooking/housework   Can travel by private vehicle     No  Equipment Recommendations  None recommended by PT       Precautions / Restrictions Precautions Precautions: Fall Precaution Comments: pt reports he doesn't remember falling, but he was told he did Restrictions Weight Bearing Restrictions: No     Mobility  Bed Mobility Overal bed mobility: Needs Assistance Bed Mobility: Supine to Sit     Supine to sit: Min assist     General bed mobility comments: Pt left sitting in recliner at end of session. Pt is Min A with cues for sequencing for supine to  sitting EOB Patient Response: Cooperative  Transfers Overall transfer level: Needs assistance Equipment used: Rolling walker (2 wheels) Transfers: Sit to/from Stand Sit to Stand: Min assist           General transfer comment: Pt continues to require cues for safety but is improving.    Ambulation/Gait Ambulation/Gait assistance: Min assist Gait Distance (Feet): 20 Feet Assistive device: Rolling walker (2 wheels) Gait Pattern/deviations: Step-through pattern, Decreased step length - right, Decreased step length - left, Decreased stride length, Knee flexed in stance - left, Knee flexed in stance - right Gait velocity: Decreased cadence. Gait velocity interpretation: <1.8 ft/sec, indicate of risk for recurrent falls   General Gait Details: stiff, low amplitude steps with need to help maneuver the RW overall.     Tilt Bed Tilt Bed Patient Response: Cooperative      Balance Overall balance assessment: Needs assistance Sitting-balance support: Bilateral upper extremity supported, Feet supported, Feet unsupported Sitting balance-Leahy Scale: Fair Sitting balance - Comments: No overt LOB sitting EOB   Standing balance support: Bilateral upper extremity supported, During functional activity, Reliant on assistive device for balance Standing balance-Leahy Scale: Poor Standing balance comment: external support needed for stability        Cognition Arousal: Alert Behavior During Therapy: WFL for tasks assessed/performed Overall Cognitive Status: No family/caregiver present to determine baseline cognitive functioning     General Comments: NT formally, following simple commands. Cognition seems to be improving.               Pertinent Vitals/Pain Pain Assessment Pain Assessment: No/denies pain  PT Goals (current goals can now be found in the care plan section) Acute Rehab PT Goals Patient Stated Goal: to be able to return home PT Goal Formulation: With  patient Time For Goal Achievement: 05/28/23 Potential to Achieve Goals: Good Progress towards PT goals: Progressing toward goals    Frequency    Min 1X/week      PT Plan  Continue with current POC       AM-PAC PT "6 Clicks" Mobility   Outcome Measure  Help needed turning from your back to your side while in a flat bed without using bedrails?: A Little Help needed moving from lying on your back to sitting on the side of a flat bed without using bedrails?: A Little Help needed moving to and from a bed to a chair (including a wheelchair)?: A Little Help needed standing up from a chair using your arms (e.g., wheelchair or bedside chair)?: A Little Help needed to walk in hospital room?: A Lot Help needed climbing 3-5 steps with a railing? : A Lot 6 Click Score: 16    End of Session Equipment Utilized During Treatment: Oxygen Activity Tolerance: Patient tolerated treatment well Patient left: in chair;with call bell/phone within reach;with chair alarm set Nurse Communication: Mobility status PT Visit Diagnosis: History of falling (Z91.81);Muscle weakness (generalized) (M62.81)     Time: 8657-8469 PT Time Calculation (min) (ACUTE ONLY): 23 min  Charges:    $Gait Training: 8-22 mins $Therapeutic Activity: 8-22 mins PT General Charges $$ ACUTE PT VISIT: 1 Visit                     Harrel Carina, DPT, CLT  Acute Rehabilitation Services Office: [phone] (Secure chat preferred)    Claudia Desanctis 05/21/2023, 2:09 PM

## 2023-05-21 NOTE — Interval H&P Note (Signed)
History and Physical Interval Note: For EGD today to evaluate heme positive stool, acute on chronic anemia in the setting of cirrhosis with known portal hypertension  The nature of the procedure, as well as the risks, benefits, and alternatives were carefully and thoroughly reviewed with the patient. Ample time for discussion and questions allowed. The patient understood, was satisfied, and agreed to proceed.      Latest Ref Rng & Units 05/21/2023    6:57 AM 05/20/2023   12:20 AM 05/19/2023    6:39 AM  CBC  WBC 4.0 - 10.5 K/uL 7.4  8.0  8.2   Hemoglobin 13.0 - 17.0 g/dL 16.1  9.9  09.6   Hematocrit 39.0 - 52.0 % 31.3  30.8  32.6   Platelets 150 - 400 K/uL 104  105  113    CMP     Component Value Date/Time   NA 139 05/21/2023 0228   K 3.8 05/21/2023 0228   CL 102 05/21/2023 0228   CO2 24 05/21/2023 0228   GLUCOSE 120 (H) 05/21/2023 0228   BUN 49 (H) 05/21/2023 0228   CREATININE 6.77 (H) 05/21/2023 0228   CREATININE 1.62 (H) 03/04/2023 1022   CALCIUM 8.4 (L) 05/21/2023 0228   PROT 5.5 (L) 05/15/2023 0039   ALBUMIN 3.0 (L) 05/21/2023 0228   AST 46 (H) 05/15/2023 0039   ALT 30 05/15/2023 0039   ALKPHOS 48 05/15/2023 0039   BILITOT 1.9 (H) 05/15/2023 0039   GFR 40.71 (L) 12/08/2022 1054   EGFR 44 (L) 03/04/2023 1022   GFRNONAA 8 (L) 05/21/2023 0228   GFRNONAA 49 (L) 01/09/2021 0959     05/21/2023 9:30 AM  Cody Hines.  has presented today for surgery, with the diagnosis of cirrhosis.  The various methods of treatment have been discussed with the patient and family. After consideration of risks, benefits and other options for treatment, the patient has consented to  Procedure(s): ESOPHAGOGASTRODUODENOSCOPY (EGD) WITH PROPOFOL (N/A) as a surgical intervention.  The patient's history has been reviewed, patient examined, no change in status, stable for surgery.  I have reviewed the patient's chart and labs.  Questions were answered to the patient's satisfaction.      Carie Caddy 

## 2023-05-22 DIAGNOSIS — N179 Acute kidney failure, unspecified: Secondary | ICD-10-CM | POA: Diagnosis not present

## 2023-05-22 LAB — MAGNESIUM: Magnesium: 1.5 mg/dL — ABNORMAL LOW (ref 1.7–2.4)

## 2023-05-22 LAB — CBC WITH DIFFERENTIAL/PLATELET
Abs Immature Granulocytes: 0.02 10*3/uL (ref 0.00–0.07)
Basophils Absolute: 0 10*3/uL (ref 0.0–0.1)
Basophils Relative: 0 %
Eosinophils Absolute: 0.2 10*3/uL (ref 0.0–0.5)
Eosinophils Relative: 3 %
HCT: 29.1 % — ABNORMAL LOW (ref 39.0–52.0)
Hemoglobin: 9.6 g/dL — ABNORMAL LOW (ref 13.0–17.0)
Immature Granulocytes: 0 %
Lymphocytes Relative: 22 %
Lymphs Abs: 1.5 10*3/uL (ref 0.7–4.0)
MCH: 29 pg (ref 26.0–34.0)
MCHC: 33 g/dL (ref 30.0–36.0)
MCV: 87.9 fL (ref 80.0–100.0)
Monocytes Absolute: 0.5 10*3/uL (ref 0.1–1.0)
Monocytes Relative: 8 %
Neutro Abs: 4.4 10*3/uL (ref 1.7–7.7)
Neutrophils Relative %: 67 %
Platelets: 102 10*3/uL — ABNORMAL LOW (ref 150–400)
RBC: 3.31 MIL/uL — ABNORMAL LOW (ref 4.22–5.81)
RDW: 15.8 % — ABNORMAL HIGH (ref 11.5–15.5)
WBC: 6.7 10*3/uL (ref 4.0–10.5)
nRBC: 0 % (ref 0.0–0.2)

## 2023-05-22 LAB — RENAL FUNCTION PANEL
Albumin: 3 g/dL — ABNORMAL LOW (ref 3.5–5.0)
Anion gap: 11 (ref 5–15)
BUN: 42 mg/dL — ABNORMAL HIGH (ref 8–23)
CO2: 25 mmol/L (ref 22–32)
Calcium: 8.4 mg/dL — ABNORMAL LOW (ref 8.9–10.3)
Chloride: 102 mmol/L (ref 98–111)
Creatinine, Ser: 5.49 mg/dL — ABNORMAL HIGH (ref 0.61–1.24)
GFR, Estimated: 10 mL/min — ABNORMAL LOW (ref >60)
Glucose, Bld: 107 mg/dL — ABNORMAL HIGH (ref 70–99)
Phosphorus: 3.7 mg/dL (ref 2.5–4.6)
Potassium: 3.4 mmol/L — ABNORMAL LOW (ref 3.5–5.1)
Sodium: 138 mmol/L (ref 135–145)

## 2023-05-22 LAB — BRAIN NATRIURETIC PEPTIDE: B Natriuretic Peptide: 289.4 pg/mL — ABNORMAL HIGH (ref 0.0–100.0)

## 2023-05-22 LAB — SURGICAL PATHOLOGY

## 2023-05-22 MED ORDER — MAGNESIUM SULFATE 4 GM/100ML IV SOLN
4.0000 g | Freq: Once | INTRAVENOUS | Status: AC
  Administered 2023-05-22: 4 g via INTRAVENOUS
  Filled 2023-05-22: qty 100

## 2023-05-22 NOTE — Progress Notes (Signed)
Physical Therapy Treatment Patient Details Name: Cody Hines. MRN: [id number] DOB: [birth year] Today's Date: 05/22/2023   History of Present Illness 77 yo male presenting to ED 8/6 with AMS. Found by ex-wife confused in his bed, incontinent of stool. CTH negative. Rt hip and Lt elbow xrays negative. +AKI, dehydration, hypotension, hypokalemia, Hgb 5.8 (transfused 2 units)  PMH  significant of HTN, OSA on CPAP, MCI, multiple spinal compression fxs, CKD, afib, cirrhosis, fibromyalgia, mild cognitive impairment    PT Comments  Pt received in supine, drowsy but awoken and with improved alertness once sitting EOB. Increased time to initiate and perform tasks, pt needing consistent minA for transfers and gait and very unsteady with RW, with difficulty managing AD on his own. Pt SpO2 weaned to RA and [facility] with exertion/sitting in chair. DBP low 112/47 sitting post-exertion, however pt did not report any orthostatic symptoms while standing. Plan to work on longer household distance gait task and stair training next session if pt agreeable. Pt continues to benefit from PT services to progress toward functional mobility goals.     If plan is discharge home, recommend the following: A little help with walking and/or transfers;A little help with bathing/dressing/bathroom;Assistance with cooking/housework   Can travel by private vehicle     No  Equipment Recommendations  None recommended by PT    Recommendations for Other Services       Precautions / Restrictions Precautions Precautions: Fall Precaution Comments: pt reports he doesn't remember falling, but he was told he did Restrictions Weight Bearing Restrictions: No     Mobility  Bed Mobility Overal bed mobility: Needs Assistance Bed Mobility: Rolling, Sidelying to Sit Rolling: Min assist Sidelying to sit: Min assist, HOB elevated       General bed mobility comments: HHA to raise trunk as pt unable to achieve upright pushing  on rails, pt needs verbal and tactile cues to initiate moving legs, pt frequently pauses due to internal distraction.    Transfers Overall transfer level: Needs assistance Equipment used: Rolling walker (2 wheels) Transfers: Sit to/from Stand Sit to Stand: Min assist           General transfer comment: From EOB>RW, pt needs mod cues for sequencing/body mechanics. RW>chair, needs cues to reach back    Ambulation/Gait Ambulation/Gait assistance: Min assist, +2 safety/equipment Gait Distance (Feet): 35 Feet Assistive device: Rolling walker (2 wheels) Gait Pattern/deviations: Step-through pattern, Decreased step length - right, Decreased step length - left, Decreased stride length, Knee flexed in stance - left, Knee flexed in stance - right, Narrow base of support Gait velocity: Decreased cadence.     General Gait Details: Stiff, low amplitude steps with need to help maneuver the RW, especially with turns and in narrow spaces of room; second person assisting with IV pole/lines. x1 episode LOB needing minA to recover with gait belt support and RW.   Stairs             Wheelchair Mobility     Tilt Bed    Modified Rankin (Stroke Patients Only)       Balance Overall balance assessment: Needs assistance Sitting-balance support: Bilateral upper extremity supported, Feet supported, Feet unsupported Sitting balance-Leahy Scale: Fair     Standing balance support: Bilateral upper extremity supported, During functional activity, Reliant on assistive device for balance Standing balance-Leahy Scale: Poor Standing balance comment: external support needed for stability, x1 significant LOB but also needs external help to move RW  Cognition Arousal: Alert Behavior During Therapy: WFL for tasks assessed/performed Overall Cognitive Status: No family/caregiver present to determine baseline cognitive functioning                                  General Comments: Drowsy upon staff arrival, awoken but slow to initiate, following simple commands well once sitting EOB/standing.        Exercises Other Exercises Other Exercises: cues for supine BLE AROM: ankle pumps, hip abduction x5-10 reps ea    General Comments General comments (skin integrity, edema, etc.): SpO2 96% on 2L O2 [place] when PTA arrived to room, once pt sitting EOB doffed [place] and SpO2 reading 93-97% on RA while seated and ambulating. BP 112/47 (65) sitting with feet down; BP 109/54 (68) reclined in chair, both post-gait trial. HR 70's bpm seated.      Pertinent Vitals/Pain Pain Assessment Pain Assessment: No/denies pain Pain Intervention(s): Monitored during session, Repositioned    Home Living                          Prior Function            PT Goals (current goals can now be found in the care plan section) Acute Rehab PT Goals Patient Stated Goal: to be able to return home PT Goal Formulation: With patient Time For Goal Achievement: 05/28/23 Progress towards PT goals: Progressing toward goals    Frequency    Min 1X/week      PT Plan      Co-evaluation              AM-PAC PT "6 Clicks" Mobility   Outcome Measure  Help needed turning from your back to your side while in a flat bed without using bedrails?: A Little Help needed moving from lying on your back to sitting on the side of a flat bed without using bedrails?: A Lot (flat bed/no rails) Help needed moving to and from a bed to a chair (including a wheelchair)?: A Little Help needed standing up from a chair using your arms (e.g., wheelchair or bedside chair)?: A Little Help needed to walk in hospital room?: A Lot Help needed climbing 3-5 steps with a railing? : Total 6 Click Score: 14    End of Session Equipment Utilized During Treatment: Gait belt;Oxygen (weaned to RA, OK per RN) Activity Tolerance: Patient tolerated treatment well;Patient limited by  fatigue Patient left: in chair;with call bell/phone within reach;with chair alarm set;with family/visitor present (nephew in room) Nurse Communication: Mobility status PT Visit Diagnosis: History of falling (Z91.81);Muscle weakness (generalized) (M62.81)     Time: 1341-1410 PT Time Calculation (min) (ACUTE ONLY): 29 min  Charges:    $Gait Training: 8-22 mins $Therapeutic Activity: 8-22 mins PT General Charges $$ ACUTE PT VISIT: 1 Visit                     Fayette Hamada P., PTA Acute Rehabilitation Services Secure Chat Preferred 9a-5:30pm Office: [phone]    Dorathy Kinsman [facility] 05/22/2023, 2:29 PM

## 2023-05-22 NOTE — TOC Progression Note (Signed)
Transition of Care (TOC) - Progression Note    Patient Details  Name: Cody Hines. MRN: [id number] Date of Birth: [birth year]  Transition of Care [facility]) CM/SW Contact  Mearl Latin, LCSW Phone Number: 05/22/2023, 3:00 PM  Clinical Narrative:    Per Rosanne Sack at [facility] Lanae Boast [phone]) they can accept patient and have received insurance approval, Ref# [id number], Plan Auth ID# [id number], effective 05/22/2023-05/26/2023 but now do not have a bed until Monday. CSW updated patient's son who will pick patient up on Monday and bring patient's CPAP to SNF.    Expected Discharge Plan: Skilled Nursing Facility Barriers to Discharge: Continued Medical Work up  Expected Discharge Plan and Services In-house Referral: Clinical Social Work   Post Acute Care Choice: Skilled Nursing Facility Living arrangements for the past 2 months: Single Family Home                                       Social Determinants of Health (SDOH) Interventions SDOH Screenings   Food Insecurity: No Food Insecurity (05/12/2023)  Housing: Low Risk  (05/12/2023)  Transportation Needs: No Transportation Needs (05/12/2023)  Utilities: Not At Risk (05/12/2023)  Depression (PHQ2-9): Low Risk  (03/28/2023)  Tobacco Use: Medium Risk (05/21/2023)    Readmission Risk Interventions     No data to display

## 2023-05-22 NOTE — Anesthesia Postprocedure Evaluation (Signed)
Anesthesia Post Note  Patient: Cody Hines.  Procedure(s) Performed: ESOPHAGOGASTRODUODENOSCOPY (EGD) WITH PROPOFOL BIOPSY     Patient location during evaluation: PACU Anesthesia Type: MAC Level of consciousness: awake and alert Pain management: pain level controlled Vital Signs Assessment: post-procedure vital signs reviewed and stable Respiratory status: spontaneous breathing, nonlabored ventilation, respiratory function stable and patient connected to nasal cannula oxygen Cardiovascular status: stable and blood pressure returned to baseline Postop Assessment: no apparent nausea or vomiting Anesthetic complications: no   No notable events documented.  Last Vitals:  Vitals:   05/22/23 0115 05/22/23 0125  BP:    Pulse: 74 73  Resp: (!) 22 17  Temp: 37.1 C   SpO2: 96% 97%    Last Pain:  Vitals:   05/21/23 2015  TempSrc:   PainSc: 0-No pain                 Earl Lites P Lorry Anastasi

## 2023-05-22 NOTE — Progress Notes (Signed)
Occupational Therapy Treatment Patient Details Name: Cody Hines Cody Hines. MRN: [id number] DOB: Apr 23, 1946 Today's Date: 05/22/2023   History of present illness 77 yo male presenting to ED 8/6 with AMS. Found by ex-wife confused in his bed, incontinent of stool. CTH negative. Rt hip and Lt elbow xrays negative. +AKI, dehydration, hypotension, hypokalemia, Hgb 5.8 (transfused 2 units)  PMH  significant of HTN, OSA on CPAP, MCI, multiple spinal compression fxs, CKD, afib, cirrhosis, fibromyalgia, mild cognitive impairment   OT comments  Pt making good progress with functional goals. Pt in recliner upon arrival with family present. Min A sit - stand form recliner to RW. Standing tolerance x 5 minutes with RW at chair. Pt on RA at 93% before activity. O2 SATs dropping to 82-83% during pt conversation. Pt instructed on eep, pursed lip breathing and recovered to >88% within seconds. Pt eager to d/c to post acute rehab when medically ready. OT will continue to follow acutely to maximize level of function and safety      If plan is discharge home, recommend the following:  Assistance with cooking/housework;Assistance with feeding;Direct supervision/assist for medications management;Direct supervision/assist for financial management;Assist for transportation;Help with stairs or ramp for entrance;Supervision due to cognitive status;A little help with walking and/or transfers;A lot of help with bathing/dressing/bathroom   Equipment Recommendations  None recommended by OT    Recommendations for Other Services      Precautions / Restrictions Precautions Precautions: Fall Precaution Comments: pt reports he doesn't remember falling, but he was told he did Restrictions Weight Bearing Restrictions: No       Mobility Bed Mobility               General bed mobility comments: pt in recliner upon arrival    Transfers Overall transfer level: Needs assistance Equipment used: Rolling walker  (2 wheels) Transfers: Sit to/from Stand Sit to Stand: Min assist                 Balance Overall balance assessment: Needs assistance Sitting-balance support: Bilateral upper extremity supported, Feet supported, Feet unsupported Sitting balance-Leahy Scale: Fair     Standing balance support: Bilateral upper extremity supported, During functional activity, Reliant on assistive device for balance Standing balance-Leahy Scale: Poor Standing balance comment: stading tolerance x 5 minutes with RW at chair. Pt on RA at 93% before activity. O2 SATs dropping to 82-83% during pt conversation. P tinstructed on eep, pursed lip breathing and recovered to >88% in seconds                           ADL either performed or assessed with clinical judgement   ADL Overall ADL's : Needs assistance/impaired     Grooming: Wash/dry hands;Wash/dry face;Contact guard assist;Standing           Upper Body Dressing : Supervision/safety;Set up       Toilet Transfer: Minimal assistance;Ambulation;Rolling walker (2 wheels);Grab bars;Cueing for safety   Toileting- Clothing Manipulation and Hygiene: Moderate assistance;Sit to/from stand       Functional mobility during ADLs: Minimal assistance;Rolling walker (2 wheels);Cueing for safety      Extremity/Trunk Assessment Upper Extremity Assessment Upper Extremity Assessment: Generalized weakness   Lower Extremity Assessment Lower Extremity Assessment: Defer to PT evaluation        Vision Baseline Vision/History: 4 Cataracts;0 No visual deficits Ability to See in Adequate Light: 0 Adequate Patient Visual Report: No change from baseline     Perception  Praxis      Cognition Arousal: Alert Behavior During Therapy: WFL for tasks assessed/performed Overall Cognitive Status: No family/caregiver present to determine baseline cognitive functioning                                 General Comments: very talkative         Exercises      Shoulder Instructions       General Comments SpO2 96% on 2L O2 [place] when PTA arrived to room, once pt sitting EOB doffed [place] and SpO2 reading 93-97% on RA while seated and ambulating. BP 112/47 (65) sitting with feet down; BP 109/54 (68) reclined in chair, both post-gait trial. HR 70's bpm seated.    Pertinent Vitals/ Pain       Pain Assessment Pain Assessment: No/denies pain Pain Score: 0-No pain Pain Intervention(s): Monitored during session, Repositioned  Home Living                                          Prior Functioning/Environment              Frequency  Min 1X/week        Progress Toward Goals  OT Goals(current goals can now be found in the care plan section)  Progress towards OT goals: Progressing toward goals     Plan      Co-evaluation                 AM-PAC OT "6 Clicks" Daily Activity     Outcome Measure   Help from another person eating meals?: None Help from another person taking care of personal grooming?: A Little Help from another person toileting, which includes using toliet, bedpan, or urinal?: A Lot Help from another person bathing (including washing, rinsing, drying)?: A Little Help from another person to put on and taking off regular upper body clothing?: A Little Help from another person to put on and taking off regular lower body clothing?: Total 6 Click Score: 16    End of Session Equipment Utilized During Treatment: Gait belt;Rolling walker (2 wheels)  OT Visit Diagnosis: Muscle weakness (generalized) (M62.81);Ataxia, unspecified (R27.0);Other symptoms and signs involving cognitive function;History of falling (Z91.81)   Activity Tolerance Patient tolerated treatment well   Patient Left with call bell/phone within reach;in chair;with chair alarm set;with family/visitor present   Nurse Communication          Time: 1434-1500 OT Time Calculation (min): 26 min  Charges: OT  General Charges $OT Visit: 1 Visit OT Treatments $Self Care/Home Management : 8-22 mins $Therapeutic Activity: 8-22 mins    Galen Manila 05/22/2023, 3:38 PM

## 2023-05-22 NOTE — Progress Notes (Signed)
PROGRESS NOTE        PATIENT DETAILS Name: Cody Hines. Age: 77 y.o. Sex: male Date of Birth: 1946-06-18 Admit Date: 05/12/2023 Admitting Physician Synetta Fail, MD UJW:JXBJYNW, [name], MD  Brief Summary: Patient is a 77 y.o.  male with history of HTN, HLD, PAF on Eliquis, cirrhosis, EtOH use, depression, mild cognitive impairment who was found by significant other confused (had not heard from him for 10 days-separated from wife).  He was found to have severe anemia, AKI, rhabdomyolysis, encephalopathy and subsequently admitted to the hospitalist service.  He was provided with supportive care-including IV fluids/blood transfusion/Rocephin nephrology and gastroenterology followed closely-paracentesis on 8/8 was consistent with SBP.    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  Significant microbiology data: 8/8>> peritoneal fluid culture: No growth  Procedures: 8/8>> paracentesis (WBC 1339 with 79% neutrophils) 05/19/2023.  Repeat ultrasound-guided paracentesis.  Improved and SBP chemistries. 05/21/23 EGD - Dr Rhea Belton  Impression:               - Normal esophagus.                           - Diffuse gastritis with contact oozing. Some                            component of portal hypertensive gastropathy.                            Biopsied.                           - No esophagal or gastric varices.                           - Mild scattered peptic duodenitis without bleeding.                           - Normal second portion of the duodenum. Moderate Sedation:      N/A Recommendation:           - Return patient to hospital ward for  ongoing care.                           - Resume previous diet.                           - Continue present medications. Daily BID PPI                            recommended now and at discharge.                           - Await pathology results.                           -  Office follow-up with Dr. Adela Lank after                            discharge.                           - Daily SBP prophylaxis per my recommendations in                            yesterday's note.                           - Repeat EGD in 2 years for variceal screening.                           - GI will sign off, but remain available. Please                            call if questions.   Consults: Nephrology GI  Subjective:  Patient in bed, appears comfortable, denies any headache, no fever, no chest pain or pressure, no shortness of breath , no abdominal pain. No new focal weakness.   Objective: Vitals: Blood pressure (!) 107/45, pulse 73, temperature 98.7 F (37.1 C), resp. rate 17, height 6\' 1"  (1.854 m), weight 103.2 kg, SpO2 97%.   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 AKI improving nephrology on board, also had developed some bladder outlet obstruction.  Improved with Foley Flomax as below.    Bladder outlet Obstruction - 1600c on [place] 05/19/23 - foley + flomax.  Rhabdomyolysis  Likely to be being down for the past several days at home, improved.   Normocytic anemia with some  subacute GI blood loss with heme positive stool.  Etiology unclear-probably some element of AKI/acute illness contributing-could have had a transient GI bleed at home (history of cirrhosis)-does have heme positive stools and GI following, 4 units of packed RBC transfusion this admission, GI on board, unremarkable EGD on 05/21/2023 except for gastritis for which she will be placed on twice daily PPI with outpatient follow-up with Dr. Adela Lank postdischarge.  Acute metabolic encephalopathy - Likely due to AKI, resolved, Neuroimaging negative  Spontaneous bacterial peritonitis Had a benign abdominal exam-minimal ascites by physical exam S/p paracentesis on 8/8-consistent with SBP On IV Rocephin x 7 days followed by Bactrim prophylactic indefinitely per GI Follow cultures-but negative 05/19/23-day 5, repeat cultures from 05/19/2023 being monitored, cytology stable, GI following  History of alcoholic liver cirrhosis Supportive care Unclear if he still consumes alcohol Diuretics on hold Continue lactulose  HTN BP stable Losartan/Cardizem/metoprolol/diuretics remain on hold  PAF Sinus rhythm Amiodarone Eliquis on hold due to severity anemia-concern for GI bleed Telemetry monitoring  HLD Hold statin given rhabdo  OSA CPAP nightly when mentation better  Depression Hold Zoloft/lorazepam for now  Hypomagnesemia and hypokalemia.  Replaced.    Debility/deconditioning PT/OT/SLP eval May require SNF  BMI: Estimated body mass index is 30.02 kg/m as calculated from the following:   Height as of this encounter: 6\' 1"  (1.854 m).   Weight as of this encounter: 103.2 kg.   Code status:   Code Status: Full Code   DVT Prophylaxis: Place and maintain sequential compression device Start: 05/16/23 0800 SCDs Start: 05/12/23 1427    Family Communication: Son-[phone]-updated 8/9   Disposition Plan: Status is: Inpatient Remains inpatient appropriate because: Severity of  illness   Planned Discharge Destination:Skilled nursing facility-when medically stable-currently not ready for discharge.   Diet: Diet Order             Diet 2 gram sodium Room service appropriate? Yes; Fluid consistency: Thin  Diet effective now                     Antimicrobial agents: Anti-infectives (From admission, onward)    Start     Dose/Rate Route Frequency Ordered Stop   05/15/23 1100  cefTRIAXone (ROCEPHIN) 2 g in sodium chloride 0.9 % 100 mL IVPB        2 g 200 mL/hr over 30 Minutes Intravenous Every 24 hours 05/14/23 1513     05/13/23 1100  cefTRIAXone (ROCEPHIN) 1 g in sodium chloride 0.9 % 100 mL IVPB  Status:  Discontinued        1 g 200 mL/hr over 30 Minutes Intravenous Every 24 hours 05/13/23 0949 05/14/23 1513        MEDICATIONS: Scheduled Meds:  sodium chloride   Intravenous Once   amiodarone  200 mg Oral Daily   Chlorhexidine Gluconate Cloth  6 each Topical Daily   ferrous sulfate  325 mg Oral BID WC   folic acid  1 mg Oral Daily   furosemide  40 mg Intravenous Q12H   midodrine  10 mg Oral BID WC   pantoprazole  40 mg Oral BID   potassium chloride  40 mEq Oral BID   sodium chloride flush  3 mL Intravenous Q12H   tamsulosin  0.4 mg Oral Daily   Continuous Infusions:  cefTRIAXone (ROCEPHIN)  IV Stopped (05/21/23 1146)   lactated ringers 10 mL/hr at 05/21/23 2332   magnesium sulfate bolus IVPB     PRN Meds:.acetaminophen **OR** acetaminophen, naLOXone (NARCAN)  injection   I have personally reviewed following labs and imaging studies  LABORATORY DATA:  Recent Labs  Lab 05/18/23 0204 05/19/23 0639 05/20/23 0020 05/21/23 0657 05/22/23 0452  WBC 8.0 8.2 8.0 7.4 6.7  HGB 9.7* 10.4* 9.9* 10.1* 9.6*  HCT 30.8* 32.6* 30.8* 31.3* 29.1*  PLT 102* 113* 105* 104* 102*  MCV 87.5 88.6 88.3 86.5 87.9  MCH 27.6 28.3 28.4 27.9 29.0  MCHC 31.5 31.9 32.1 32.3 33.0  RDW 15.4 15.2 15.5 15.6* 15.8*  LYMPHSABS 1.4 1.5 1.3 1.3 1.5  MONOABS 0.8  0.9 0.8 0.6 0.5  EOSABS 0.4 0.4 0.3 0.2 0.2  BASOSABS 0.0 0.0 0.0 0.0 0.0    Recent Labs  Lab 05/16/23 0205 05/17/23 0244 05/18/23 0204 05/19/23 0639 05/20/23 0020 05/21/23 0228 05/22/23 0452  NA 136 136 137 139 138 139 138  K 3.3* 3.3* 3.3* 3.2* 3.3* 3.8 3.4*  CL 102 100 100 104 102 102 102  CO2 20* 21* 21* 22 22 24 25   ANIONGAP 14 15 16* 13 14 13 11   GLUCOSE 125* 123* 119* 116* 147* 120* 107*  BUN 68* 66* 64* 60* 60*  49* 42*  CREATININE 9.77* 9.30* 8.78* 8.36* 8.11* 6.77* 5.49*  ALBUMIN 3.5 3.3* 3.2* 3.2* 2.9* 3.0* 3.0*  CRP 5.3* 5.5* 5.6* 6.5*  --   --   --   PROCALCITON 0.25 0.24 0.24 0.23 0.22  --   --   BNP  --  1,734.1*  --   --   --   --  289.4*  MG  --   --   --   --   --   --  1.5*  CALCIUM 8.1* 8.5* 8.4* 8.6* 8.4* 8.4* 8.4*   MICROBIOLOGY: Recent Results (from the past 240 hour(s))  Gram stain     Status: None   Collection Time: 05/14/23 11:52 AM   Specimen: Abdomen; Peritoneal Fluid  Result Value Ref Range Status   Specimen Description PERITONEAL  Final   Special Requests NONE  Final   Gram Stain   Final    FEW WBC PRESENT, PREDOMINANTLY PMN NO ORGANISMS SEEN Performed at [facility], [address], [place], [place] [address]    Report Status 05/14/2023 FINAL  Final  Culture, body fluid w Gram Stain-bottle     Status: None   Collection Time: 05/14/23 11:52 AM   Specimen: Peritoneal Washings  Result Value Ref Range Status   Specimen Description PERITONEAL  Final   Special Requests NONE  Final   Culture   Final    NO GROWTH 5 DAYS Performed at [facility], [address], [place], [place] [address]    Report Status 05/19/2023 FINAL  Final  Culture, body fluid w Gram Stain-bottle     Status: None (Preliminary result)   Collection Time: 05/19/23  9:46 AM   Specimen: Peritoneal Washings  Result Value Ref Range Status   Specimen Description PERITONEAL  Final   Special Requests NONE  Final   Culture   Final    NO GROWTH 3  DAYS Performed at [facility], [address] [place], [place] [address]    Report Status PENDING  Incomplete  Gram stain     Status: None   Collection Time: 05/19/23  9:46 AM   Specimen: Peritoneal Washings  Result Value Ref Range Status   Specimen Description PERITONEAL  Final   Special Requests NONE  Final   Gram Stain   Final    NO WBC SEEN NO ORGANISMS SEEN Performed at [facility], [address] [place], [place] [address]    Report Status 05/19/2023 FINAL  Final    RADIOLOGY STUDIES/RESULTS: No results found.   LOS: 10 days   Signature  -    Susa Raring M.D on 05/22/2023 at 8:49 AM   -  To page go to [email or web address]

## 2023-05-23 DIAGNOSIS — N179 Acute kidney failure, unspecified: Secondary | ICD-10-CM | POA: Diagnosis not present

## 2023-05-23 DIAGNOSIS — K652 Spontaneous bacterial peritonitis: Secondary | ICD-10-CM | POA: Diagnosis not present

## 2023-05-23 LAB — RENAL FUNCTION PANEL
Albumin: 3.1 g/dL — ABNORMAL LOW (ref 3.5–5.0)
Anion gap: 13 (ref 5–15)
BUN: 36 mg/dL — ABNORMAL HIGH (ref 8–23)
CO2: 25 mmol/L (ref 22–32)
Calcium: 8.6 mg/dL — ABNORMAL LOW (ref 8.9–10.3)
Chloride: 97 mmol/L — ABNORMAL LOW (ref 98–111)
Creatinine, Ser: 4.98 mg/dL — ABNORMAL HIGH (ref 0.61–1.24)
GFR, Estimated: 12 mL/min — ABNORMAL LOW
Glucose, Bld: 115 mg/dL — ABNORMAL HIGH (ref 70–99)
Phosphorus: 3.7 mg/dL (ref 2.5–4.6)
Potassium: 3.4 mmol/L — ABNORMAL LOW (ref 3.5–5.1)
Sodium: 135 mmol/L (ref 135–145)

## 2023-05-23 LAB — CBC WITH DIFFERENTIAL/PLATELET
Abs Immature Granulocytes: 0.04 K/uL (ref 0.00–0.07)
Basophils Absolute: 0 K/uL (ref 0.0–0.1)
Basophils Relative: 1 %
Eosinophils Absolute: 0.3 K/uL (ref 0.0–0.5)
Eosinophils Relative: 5 %
HCT: 32.1 % — ABNORMAL LOW (ref 39.0–52.0)
Hemoglobin: 10.4 g/dL — ABNORMAL LOW (ref 13.0–17.0)
Immature Granulocytes: 1 %
Lymphocytes Relative: 18 %
Lymphs Abs: 1.2 K/uL (ref 0.7–4.0)
MCH: 28.8 pg (ref 26.0–34.0)
MCHC: 32.4 g/dL (ref 30.0–36.0)
MCV: 88.9 fL (ref 80.0–100.0)
Monocytes Absolute: 0.5 K/uL (ref 0.1–1.0)
Monocytes Relative: 8 %
Neutro Abs: 4.3 K/uL (ref 1.7–7.7)
Neutrophils Relative %: 67 %
Platelets: 100 K/uL — ABNORMAL LOW (ref 150–400)
RBC: 3.61 MIL/uL — ABNORMAL LOW (ref 4.22–5.81)
RDW: 15.6 % — ABNORMAL HIGH (ref 11.5–15.5)
WBC: 6.3 K/uL (ref 4.0–10.5)
nRBC: 0 % (ref 0.0–0.2)

## 2023-05-23 LAB — BRAIN NATRIURETIC PEPTIDE: B Natriuretic Peptide: 373.7 pg/mL — ABNORMAL HIGH (ref 0.0–100.0)

## 2023-05-23 LAB — MAGNESIUM: Magnesium: 2.1 mg/dL (ref 1.7–2.4)

## 2023-05-23 MED ORDER — POTASSIUM CHLORIDE CRYS ER 20 MEQ PO TBCR: 40.0000 meq | EXTENDED_RELEASE_TABLET | Freq: Once | ORAL | Status: DC

## 2023-05-23 MED ORDER — POTASSIUM CHLORIDE 10 MEQ/100ML IV SOLN
10.0000 meq | INTRAVENOUS | Status: AC
  Administered 2023-05-23 (×2): 10 meq via INTRAVENOUS
  Filled 2023-05-23 (×2): qty 100

## 2023-05-23 MED ORDER — APIXABAN 5 MG PO TABS
5.0000 mg | ORAL_TABLET | Freq: Two times a day (BID) | ORAL | Status: DC
  Administered 2023-05-23 – 2023-05-25 (×5): 5 mg via ORAL
  Filled 2023-05-23 (×5): qty 1

## 2023-05-23 NOTE — Discharge Instructions (Signed)

## 2023-05-23 NOTE — Progress Notes (Addendum)
PROGRESS NOTE        PATIENT DETAILS Name: Cody Hines. Age: 77 y.o. Sex: male Date of Birth: 1946-09-26 Admit Date: 05/12/2023 Admitting Physician Synetta Fail, MD UJW:JXBJYNW, [name], MD  Brief Summary: Patient is a 77 y.o.  male with history of HTN, HLD, PAF on Eliquis, cirrhosis, EtOH use, depression, mild cognitive impairment who was found by significant other confused (had not heard from him for 10 days-separated from wife).  He was found to have severe anemia, AKI, rhabdomyolysis, encephalopathy and subsequently admitted to the hospitalist service.  He was provided with supportive care-including IV fluids/blood transfusion/Rocephin nephrology and gastroenterology followed closely-paracentesis on 8/8 was consistent with SBP.    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  Significant microbiology data: 8/8>> peritoneal fluid culture: No growth  Procedures: 8/8>> paracentesis (WBC 1339 with 79% neutrophils) 05/19/2023.  Repeat ultrasound-guided paracentesis.  Improved and SBP chemistries. 05/21/23 EGD - Dr Rhea Belton  Impression:               - Normal esophagus.                           - Diffuse gastritis with contact oozing. Some                            component of portal hypertensive gastropathy.                            Biopsied.                           - No esophagal or gastric varices.                           - Mild scattered peptic duodenitis without bleeding.                           - Normal second portion of the duodenum. Moderate Sedation:      N/A Recommendation:           - Return patient to hospital ward for  ongoing care.                           - Resume previous diet.                           - Continue present medications. Daily BID PPI                            recommended now and at discharge.                           - Await pathology results.                           -  Office follow-up with Dr. Adela Lank after                            discharge.                           - Daily SBP prophylaxis per my recommendations in                            yesterday's note.                           - Repeat EGD in 2 years for variceal screening.                           - GI will sign off, but remain available. Please                            call if questions.   Consults: Nephrology GI  Subjective: Patient in bed, appears comfortable, denies any headache, no fever, no chest pain or pressure, no shortness of breath , no abdominal pain. No focal weakness.   Objective: Vitals: Blood pressure (!) 96/48, pulse (!) 58, temperature 98 F (36.7 C), temperature source Oral, resp. rate 14, height 6\' 1"  (1.854 m), weight 103.2 kg, SpO2 99%.   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 AKI improving nephrology on board, also had developed some bladder outlet obstruction.  Improved with Foley Flomax as below.    Bladder outlet Obstruction - 1600c on [place] 05/19/23 - foley + flomax.  Rhabdomyolysis  Likely to be being down for the past several days at home, improved.   Normocytic  anemia with some subacute GI blood loss with heme positive stool.  Etiology unclear-probably some element of AKI/acute illness contributing-could have had a transient GI bleed at home (history of cirrhosis)-does have heme positive stools and GI following, 4 units of packed RBC transfusion this admission, GI on board, unremarkable EGD on 05/21/2023 except for gastritis for which she will be placed on twice daily PPI with outpatient follow-up with Dr. Adela Lank postdischarge.  Acute metabolic encephalopathy - Likely due to AKI, resolved, Neuroimaging negative  Spontaneous bacterial peritonitis Had a benign abdominal exam-minimal ascites by physical exam S/p paracentesis on 8/8-consistent with SBP On IV Rocephin x 7 days followed by Bactrim prophylactic indefinitely per GI, once renal function improves will transition him to Bactrim for indefinite SBP prophylaxis. Follow cultures-but negative 05/19/23-day 5, repeat cultures from 05/19/2023 being monitored, cytology stable, GI following  History of alcoholic liver cirrhosis Supportive care Unclear if he still consumes alcohol Diuretics on hold Continue lactulose  HTN BP stable Losartan/Cardizem/metoprolol/diuretics remain on hold  PAF Sinus rhythm Amiodarone Eliquis was on hold due to GI bleed, EGD unremarkable, no signs of ongoing bleeding, cautiously restart Eliquis on 05/23/2023 and  monitor.  HLD Hold statin given rhabdo  OSA CPAP nightly when mentation better  Depression Hold Zoloft/lorazepam for now  Hypomagnesemia and hypokalemia.  Replaced.    Debility/deconditioning PT/OT/SLP eval May require SNF  BMI: Estimated body mass index is 30.02 kg/m as calculated from the following:   Height as of this encounter: 6\' 1"  (1.854 m).   Weight as of this encounter: 103.2 kg.   Code status:   Code Status: Full Code   DVT Prophylaxis: Place and maintain sequential compression device Start: 05/16/23 0800 SCDs Start: 05/12/23  1427    Family Communication: Son-[phone]-updated 8/9   Disposition Plan: Status is: Inpatient Remains inpatient appropriate because: Severity of illness   Planned Discharge Destination:Skilled nursing facility-when medically stable-currently not ready for discharge.   Diet: Diet Order             Diet 2 gram sodium Room service appropriate? Yes; Fluid consistency: Thin  Diet effective now                   MEDICATIONS: Scheduled Meds:  amiodarone  200 mg Oral Daily   Chlorhexidine Gluconate Cloth  6 each Topical Daily   ferrous sulfate  325 mg Oral BID WC   folic acid  1 mg Oral Daily   furosemide  40 mg Intravenous Q12H   midodrine  10 mg Oral BID WC   pantoprazole  40 mg Oral BID   potassium chloride  40 mEq Oral BID   sodium chloride flush  3 mL Intravenous Q12H   tamsulosin  0.4 mg Oral Daily   Continuous Infusions:  cefTRIAXone (ROCEPHIN)  IV Stopped (05/22/23 1308)   lactated ringers 10 mL/hr at 05/22/23 0800   potassium chloride 10 mEq (05/23/23 0604)   PRN Meds:.acetaminophen **OR** acetaminophen, naLOXone (NARCAN)  injection   I have personally reviewed following labs and imaging studies  LABORATORY DATA:  Recent Labs  Lab 05/19/23 0639 05/20/23 0020 05/21/23 0657 05/22/23 0452 05/23/23 0045  WBC 8.2 8.0 7.4 6.7 6.3  HGB 10.4* 9.9* 10.1* 9.6* 10.4*  HCT 32.6* 30.8* 31.3* 29.1* 32.1*  PLT 113* 105* 104* 102* 100*  MCV 88.6 88.3 86.5 87.9 88.9  MCH 28.3 28.4 27.9 29.0 28.8  MCHC 31.9 32.1 32.3 33.0 32.4  RDW 15.2 15.5 15.6* 15.8* 15.6*  LYMPHSABS 1.5 1.3 1.3 1.5 1.2  MONOABS 0.9 0.8 0.6 0.5 0.5  EOSABS 0.4 0.3 0.2 0.2 0.3  BASOSABS 0.0 0.0 0.0 0.0 0.0    Recent Labs  Lab 05/17/23 0244 05/18/23 0204 05/19/23 0639 05/20/23 0020 05/21/23 0228 05/22/23 0452 05/23/23 0045  NA 136 137 139 138 139 138 135  K 3.3* 3.3* 3.2* 3.3* 3.8 3.4* 3.4*  CL 100 100 104 102 102 102 97*  CO2 21* 21* 22 22 24 25 25   ANIONGAP 15 16*  13 14 13 11 13   GLUCOSE 123* 119* 116* 147* 120* 107* 115*  BUN 66* 64* 60* 60* 49* 42* 36*  CREATININE 9.30* 8.78* 8.36* 8.11* 6.77* 5.49* 4.98*  ALBUMIN 3.3* 3.2* 3.2* 2.9* 3.0* 3.0* 3.1*  CRP 5.5* 5.6* 6.5*  --   --   --   --   PROCALCITON 0.24 0.24 0.23 0.22  --   --   --   BNP 1,734.1*  --   --   --   --  289.4* 373.7*  MG  --   --   --   --   --  1.5* 2.1  CALCIUM 8.5* 8.4* 8.6*  8.4* 8.4* 8.4* 8.6*   MICROBIOLOGY: Recent Results (from the past 240 hour(s))  Gram stain     Status: None   Collection Time: 05/14/23 11:52 AM   Specimen: Abdomen; Peritoneal Fluid  Result Value Ref Range Status   Specimen Description PERITONEAL  Final   Special Requests NONE  Final   Gram Stain   Final    FEW WBC PRESENT, PREDOMINANTLY PMN NO ORGANISMS SEEN Performed at [facility], [address], [place], [place] [address]    Report Status 05/14/2023 FINAL  Final  Culture, body fluid w Gram Stain-bottle     Status: None   Collection Time: 05/14/23 11:52 AM   Specimen: Peritoneal Washings  Result Value Ref Range Status   Specimen Description PERITONEAL  Final   Special Requests NONE  Final   Culture   Final    NO GROWTH 5 DAYS Performed at [facility], [address] [place], [place] [address]    Report Status 05/19/2023 FINAL  Final  Culture, body fluid w Gram Stain-bottle     Status: None (Preliminary result)   Collection Time: 05/19/23  9:46 AM   Specimen: Peritoneal Washings  Result Value Ref Range Status   Specimen Description PERITONEAL  Final   Special Requests NONE  Final   Culture   Final    NO GROWTH 4 DAYS Performed at [facility], [address] [place], [place] [address]    Report Status PENDING  Incomplete  Gram stain     Status: None   Collection Time: 05/19/23  9:46 AM   Specimen: Peritoneal Washings  Result Value Ref Range Status   Specimen Description PERITONEAL  Final   Special Requests NONE  Final   Gram Stain   Final    NO WBC SEEN NO  ORGANISMS SEEN Performed at [facility], [address], [place], [place] [address]    Report Status 05/19/2023 FINAL  Final    RADIOLOGY STUDIES/RESULTS: No results found.   LOS: 11 days   Signature  -    Susa Raring M.D on 05/23/2023 at 7:33 AM   -  To page go to [email or web address]

## 2023-05-23 NOTE — Plan of Care (Signed)

## 2023-05-24 DIAGNOSIS — N179 Acute kidney failure, unspecified: Secondary | ICD-10-CM | POA: Diagnosis not present

## 2023-05-24 DIAGNOSIS — K652 Spontaneous bacterial peritonitis: Secondary | ICD-10-CM | POA: Diagnosis not present

## 2023-05-24 LAB — RENAL FUNCTION PANEL
Albumin: 3 g/dL — ABNORMAL LOW (ref 3.5–5.0)
Anion gap: 11 (ref 5–15)
BUN: 33 mg/dL — ABNORMAL HIGH (ref 8–23)
CO2: 25 mmol/L (ref 22–32)
Calcium: 8.5 mg/dL — ABNORMAL LOW (ref 8.9–10.3)
Chloride: 100 mmol/L (ref 98–111)
Creatinine, Ser: 4.38 mg/dL — ABNORMAL HIGH (ref 0.61–1.24)
GFR, Estimated: 13 mL/min — ABNORMAL LOW (ref >60)
Glucose, Bld: 114 mg/dL — ABNORMAL HIGH (ref 70–99)
Phosphorus: 3.3 mg/dL (ref 2.5–4.6)
Potassium: 3.8 mmol/L (ref 3.5–5.1)
Sodium: 136 mmol/L (ref 135–145)

## 2023-05-24 LAB — GLUCOSE, CAPILLARY: Glucose-Capillary: 129 mg/dL — ABNORMAL HIGH (ref 70–99)

## 2023-05-24 LAB — CBC WITH DIFFERENTIAL/PLATELET
Abs Immature Granulocytes: 0.01 10*3/uL (ref 0.00–0.07)
Basophils Absolute: 0 10*3/uL (ref 0.0–0.1)
Basophils Relative: 1 %
Eosinophils Absolute: 0.3 10*3/uL (ref 0.0–0.5)
Eosinophils Relative: 5 %
HCT: 31.6 % — ABNORMAL LOW (ref 39.0–52.0)
Hemoglobin: 9.9 g/dL — ABNORMAL LOW (ref 13.0–17.0)
Immature Granulocytes: 0 %
Lymphocytes Relative: 19 %
Lymphs Abs: 1.2 10*3/uL (ref 0.7–4.0)
MCH: 27.4 pg (ref 26.0–34.0)
MCHC: 31.3 g/dL (ref 30.0–36.0)
MCV: 87.5 fL (ref 80.0–100.0)
Monocytes Absolute: 0.4 10*3/uL (ref 0.1–1.0)
Monocytes Relative: 7 %
Neutro Abs: 4.3 10*3/uL (ref 1.7–7.7)
Neutrophils Relative %: 68 %
Platelets: 108 10*3/uL — ABNORMAL LOW (ref 150–400)
RBC: 3.61 MIL/uL — ABNORMAL LOW (ref 4.22–5.81)
RDW: 15.8 % — ABNORMAL HIGH (ref 11.5–15.5)
WBC: 6.2 10*3/uL (ref 4.0–10.5)
nRBC: 0 % (ref 0.0–0.2)

## 2023-05-24 LAB — MAGNESIUM: Magnesium: 1.8 mg/dL (ref 1.7–2.4)

## 2023-05-24 LAB — BRAIN NATRIURETIC PEPTIDE: B Natriuretic Peptide: 239.8 pg/mL — ABNORMAL HIGH (ref 0.0–100.0)

## 2023-05-24 NOTE — Progress Notes (Signed)
PROGRESS NOTE        PATIENT DETAILS Name: Cody Hines. Age: 77 y.o. Sex: male Date of Birth: 10/18/1945 Admit Date: 05/12/2023 Admitting Physician Synetta Fail, MD UJW:[id number], [name], MD  Brief Summary: Patient is a 77 y.o.  male with history of HTN, HLD, PAF on Eliquis, cirrhosis, EtOH use, depression, mild cognitive impairment who was found by significant other confused (had not heard from him for 10 days-separated from wife).  He was found to have severe anemia, AKI, rhabdomyolysis, encephalopathy and subsequently admitted to the hospitalist service.  He was provided with supportive care-including IV fluids/blood transfusion/Rocephin nephrology and gastroenterology followed closely-paracentesis on 8/8 was consistent with SBP.    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  Significant microbiology data: 8/8>> peritoneal fluid culture: No growth  Procedures: 8/8>> paracentesis (WBC 1339 with 79% neutrophils) 05/19/2023.  Repeat ultrasound-guided paracentesis.  Improved and SBP chemistries. 05/21/23 EGD - Dr Rhea Belton  Impression:               - Normal esophagus.                           - Diffuse gastritis with contact oozing. Some                            component of portal hypertensive gastropathy.                            Biopsied.                           - No esophagal or gastric varices.                           - Mild scattered peptic duodenitis without bleeding.                           - Normal second portion of the duodenum. Moderate Sedation:      N/A Recommendation:           - Return patient to hospital ward for  ongoing care.                           - Resume previous diet.                           - Continue present medications. Daily BID PPI                            recommended now and at discharge.                           - Await pathology results.                           -  Office follow-up with Dr. Adela Lank after                            discharge.                           - Daily SBP prophylaxis per my recommendations in                            yesterday's note.                           - Repeat EGD in 2 years for variceal screening.                           - GI will sign off, but remain available. Please                            call if questions.   Consults: Nephrology GI  Subjective: Patient in bed, appears comfortable, denies any headache, no fever, no chest pain or pressure, no shortness of breath , no abdominal pain. No focal weakness.   Objective: Vitals: Blood pressure (!) 105/48, pulse (!) 58, temperature 98.2 F (36.8 C), temperature source Oral, resp. rate (!) 21, height 6\' 1"  (1.854 m), weight 103.2 kg, SpO2 92%.   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 AKI improving nephrology on board, also had developed some bladder outlet obstruction.  Improved with Foley Flomax as below.    Bladder outlet Obstruction - 1600c on [place] 05/19/23 - foley + flomax.  Rhabdomyolysis  Likely to be being down for the past several days at home, improved.    Normocytic anemia with some subacute GI blood loss with heme positive stool.  Etiology unclear-probably some element of AKI/acute illness contributing-could have had a transient GI bleed at home (history of cirrhosis)-does have heme positive stools and GI following, 4 units of packed RBC transfusion this admission, GI on board, unremarkable EGD on 05/21/2023 except for gastritis for which she will be placed on twice daily PPI with outpatient follow-up with Dr. Adela Lank postdischarge.  Acute metabolic encephalopathy - Likely due to AKI, resolved, Neuroimaging negative  Spontaneous bacterial peritonitis Had a benign abdominal exam-minimal ascites by physical exam S/p paracentesis on 8/8-consistent with SBP On IV Rocephin x 7 days followed by Bactrim prophylactic indefinitely per GI, once renal function improves will transition him to Bactrim for indefinite SBP prophylaxis. Follow cultures-but negative 05/19/23-day 5, repeat cultures from 05/19/2023 being monitored, cytology stable, GI following  History of alcoholic liver cirrhosis Supportive care Unclear if he still consumes alcohol Diuretics on hold Continue lactulose  HTN BP stable Losartan/Cardizem/metoprolol/diuretics remain on hold  PAF Sinus rhythm Amiodarone Eliquis was on hold due to GI bleed, EGD unremarkable, no signs of ongoing bleeding, cautiously restart Eliquis on 05/23/2023  and monitor.  HLD Hold statin given rhabdo  OSA CPAP nightly when mentation better  Depression Hold Zoloft/lorazepam for now  Hypomagnesemia and hypokalemia.  Replaced.    Debility/deconditioning PT/OT/SLP eval May require SNF  BMI: Estimated body mass index is 30.02 kg/m as calculated from the following:   Height as of this encounter: 6\' 1"  (1.854 m).   Weight as of this encounter: 103.2 kg.   Code status:   Code Status: Full Code   DVT Prophylaxis: Place and maintain sequential compression device Start: 05/16/23 0800 SCDs Start:  05/12/23 1427 apixaban (ELIQUIS) tablet 5 mg    Family Communication: Son-Mitchell-[phone]-updated 8/9   Disposition Plan: Status is: Inpatient Remains inpatient appropriate because: Severity of illness   Planned Discharge Destination:Skilled nursing facility-when medically stable-currently not ready for discharge.   Diet: Diet Order             Diet 2 gram sodium Room service appropriate? Yes; Fluid consistency: Thin  Diet effective now                   MEDICATIONS: Scheduled Meds:  amiodarone  200 mg Oral Daily   apixaban  5 mg Oral BID   Chlorhexidine Gluconate Cloth  6 each Topical Daily   ferrous sulfate  325 mg Oral BID WC   folic acid  1 mg Oral Daily   furosemide  40 mg Intravenous Q12H   midodrine  10 mg Oral BID WC   pantoprazole  40 mg Oral BID   potassium chloride  40 mEq Oral BID   sodium chloride flush  3 mL Intravenous Q12H   tamsulosin  0.4 mg Oral Daily   Continuous Infusions:  cefTRIAXone (ROCEPHIN)  IV Stopped (05/23/23 1317)   lactated ringers Stopped (05/22/23 1647)   PRN Meds:.acetaminophen **OR** acetaminophen, naLOXone (NARCAN)  injection   I have personally reviewed following labs and imaging studies  LABORATORY DATA:  Recent Labs  Lab 05/20/23 0020 05/21/23 0657 05/22/23 0452 05/23/23 0045 05/24/23 0130  WBC 8.0 7.4 6.7 6.3 6.2  HGB 9.9* 10.1* 9.6* 10.4* 9.9*  HCT 30.8* 31.3* 29.1* 32.1* 31.6*  PLT 105* 104* 102* 100* 108*  MCV 88.3 86.5 87.9 88.9 87.5  MCH 28.4 27.9 29.0 28.8 27.4  MCHC 32.1 32.3 33.0 32.4 31.3  RDW 15.5 15.6* 15.8* 15.6* 15.8*  LYMPHSABS 1.3 1.3 1.5 1.2 1.2  MONOABS 0.8 0.6 0.5 0.5 0.4  EOSABS 0.3 0.2 0.2 0.3 0.3  BASOSABS 0.0 0.0 0.0 0.0 0.0    Recent Labs  Lab 05/18/23 0204 05/19/23 0639 05/20/23 0020 05/21/23 0228 05/22/23 0452 05/23/23 0045 05/24/23 0130  NA 137 139 138 139 138 135 136  K 3.3* 3.2* 3.3* 3.8 3.4* 3.4* 3.8  CL 100 104 102 102 102 97* 100  CO2 21* 22 22 24 25 25 25    ANIONGAP 16* 13 14 13 11 13 11   GLUCOSE 119* 116* 147* 120* 107* 115* 114*  BUN 64* 60* 60* 49* 42* 36* 33*  CREATININE 8.78* 8.36* 8.11* 6.77* 5.49* 4.98* 4.38*  ALBUMIN 3.2* 3.2* 2.9* 3.0* 3.0* 3.1* 3.0*  CRP 5.6* 6.5*  --   --   --   --   --   PROCALCITON 0.24 0.23 0.22  --   --   --   --   BNP  --   --   --   --  289.4* 373.7* 239.8*  MG  --   --   --   --  1.5* 2.1  1.8  CALCIUM 8.4* 8.6* 8.4* 8.4* 8.4* 8.6* 8.5*   MICROBIOLOGY: Recent Results (from the past 240 hour(s))  Gram stain     Status: None   Collection Time: 05/14/23 11:52 AM   Specimen: Abdomen; Peritoneal Fluid  Result Value Ref Range Status   Specimen Description PERITONEAL  Final   Special Requests NONE  Final   Gram Stain   Final    FEW WBC PRESENT, PREDOMINANTLY PMN NO ORGANISMS SEEN Performed at [facility], [address] [place], [place] [address]    Report Status 05/14/2023 FINAL  Final  Culture, body fluid w Gram Stain-bottle     Status: None   Collection Time: 05/14/23 11:52 AM   Specimen: Peritoneal Washings  Result Value Ref Range Status   Specimen Description PERITONEAL  Final   Special Requests NONE  Final   Culture   Final    NO GROWTH 5 DAYS Performed at [facility], [address], [place], [place] [address]    Report Status 05/19/2023 FINAL  Final  Culture, body fluid w Gram Stain-bottle     Status: None   Collection Time: 05/19/23  9:46 AM   Specimen: Peritoneal Washings  Result Value Ref Range Status   Specimen Description PERITONEAL  Final   Special Requests NONE  Final   Culture   Final    NO GROWTH 5 DAYS Performed at [facility], [address], [place], [place] [address]    Report Status 05/24/2023 FINAL  Final  Gram stain     Status: None   Collection Time: 05/19/23  9:46 AM   Specimen: Peritoneal Washings  Result Value Ref Range Status   Specimen Description PERITONEAL  Final   Special Requests NONE  Final   Gram Stain   Final    NO WBC SEEN NO  ORGANISMS SEEN Performed at [facility], [address] [place], [place] [address]    Report Status 05/19/2023 FINAL  Final    RADIOLOGY STUDIES/RESULTS: No results found.   LOS: 12 days   Signature  -    Susa Raring M.D on 05/24/2023 at 9:58 AM   -  To page go to [email or web address]

## 2023-05-25 ENCOUNTER — Encounter (HOSPITAL_COMMUNITY): Payer: Self-pay | Admitting: Internal Medicine

## 2023-05-25 ENCOUNTER — Ambulatory Visit: Payer: Medicare Other | Admitting: Cardiology

## 2023-05-25 DIAGNOSIS — N179 Acute kidney failure, unspecified: Secondary | ICD-10-CM | POA: Diagnosis not present

## 2023-05-25 LAB — CBC WITH DIFFERENTIAL/PLATELET
Abs Immature Granulocytes: 0.01 10*3/uL (ref 0.00–0.07)
Basophils Absolute: 0 10*3/uL (ref 0.0–0.1)
Basophils Relative: 0 %
Eosinophils Absolute: 0.3 10*3/uL (ref 0.0–0.5)
Eosinophils Relative: 6 %
HCT: 32.6 % — ABNORMAL LOW (ref 39.0–52.0)
Hemoglobin: 10.4 g/dL — ABNORMAL LOW (ref 13.0–17.0)
Immature Granulocytes: 0 %
Lymphocytes Relative: 26 %
Lymphs Abs: 1.4 10*3/uL (ref 0.7–4.0)
MCH: 28.7 pg (ref 26.0–34.0)
MCHC: 31.9 g/dL (ref 30.0–36.0)
MCV: 90.1 fL (ref 80.0–100.0)
Monocytes Absolute: 0.4 10*3/uL (ref 0.1–1.0)
Monocytes Relative: 7 %
Neutro Abs: 3.2 10*3/uL (ref 1.7–7.7)
Neutrophils Relative %: 61 %
Platelets: 105 10*3/uL — ABNORMAL LOW (ref 150–400)
RBC: 3.62 MIL/uL — ABNORMAL LOW (ref 4.22–5.81)
RDW: 16 % — ABNORMAL HIGH (ref 11.5–15.5)
WBC: 5.3 10*3/uL (ref 4.0–10.5)
nRBC: 0 % (ref 0.0–0.2)

## 2023-05-25 LAB — RENAL FUNCTION PANEL
Albumin: 3 g/dL — ABNORMAL LOW (ref 3.5–5.0)
Anion gap: 10 (ref 5–15)
BUN: 31 mg/dL — ABNORMAL HIGH (ref 8–23)
CO2: 25 mmol/L (ref 22–32)
Calcium: 8.7 mg/dL — ABNORMAL LOW (ref 8.9–10.3)
Chloride: 102 mmol/L (ref 98–111)
Creatinine, Ser: 4.06 mg/dL — ABNORMAL HIGH (ref 0.61–1.24)
GFR, Estimated: 15 mL/min — ABNORMAL LOW (ref >60)
Glucose, Bld: 146 mg/dL — ABNORMAL HIGH (ref 70–99)
Phosphorus: 3.7 mg/dL (ref 2.5–4.6)
Potassium: 3.7 mmol/L (ref 3.5–5.1)
Sodium: 137 mmol/L (ref 135–145)

## 2023-05-25 LAB — GLUCOSE, CAPILLARY: Glucose-Capillary: 118 mg/dL — ABNORMAL HIGH (ref 70–99)

## 2023-05-25 LAB — BRAIN NATRIURETIC PEPTIDE: B Natriuretic Peptide: 154.8 pg/mL — ABNORMAL HIGH (ref 0.0–100.0)

## 2023-05-25 LAB — MAGNESIUM: Magnesium: 1.9 mg/dL (ref 1.7–2.4)

## 2023-05-25 MED ORDER — LACTULOSE 10 G PO PACK: 10.0000 g | PACK | Freq: Two times a day (BID) | ORAL | Status: DC

## 2023-05-25 MED ORDER — MIDODRINE HCL 10 MG PO TABS: 5.0000 mg | ORAL_TABLET | Freq: Two times a day (BID) | ORAL | Status: DC

## 2023-05-25 MED ORDER — FUROSEMIDE 40 MG PO TABS: 40.0000 mg | ORAL_TABLET | Freq: Every day | ORAL | Status: DC

## 2023-05-25 MED ORDER — FOLIC ACID 1 MG PO TABS: 1.0000 mg | ORAL_TABLET | Freq: Every day | ORAL | Status: DC

## 2023-05-25 MED ORDER — TAMSULOSIN HCL 0.4 MG PO CAPS: 0.4000 mg | ORAL_CAPSULE | Freq: Every day | ORAL | Status: DC

## 2023-05-25 MED ORDER — POTASSIUM CHLORIDE ER 10 MEQ PO TBCR: 10.0000 meq | EXTENDED_RELEASE_TABLET | Freq: Every day | ORAL | Status: DC

## 2023-05-25 MED ORDER — PANTOPRAZOLE SODIUM 40 MG PO TBEC: 40.0000 mg | DELAYED_RELEASE_TABLET | Freq: Two times a day (BID) | ORAL | Status: DC

## 2023-05-25 MED ORDER — SULFAMETHOXAZOLE-TRIMETHOPRIM 800-160 MG PO TABS: 1.0000 | ORAL_TABLET | ORAL | Status: DC

## 2023-05-25 MED ORDER — FERROUS SULFATE 325 (65 FE) MG PO TABS: 325.0000 mg | ORAL_TABLET | Freq: Two times a day (BID) | ORAL | Status: DC

## 2023-05-25 NOTE — TOC Transition Note (Addendum)
Transition of Care [name]) - CM/SW Discharge Note   Patient Details  Name: Cody Hines. MRN: [id number] Date of Birth: 1946-03-26  Transition of Care [name]) CM/SW Contact:  Erin Sons, LCSW Phone Number: 05/25/2023, 11:31 AM   Clinical Narrative:     Per MD patient ready for DC to [name](SNF). RN, patient, patient's family, and facility notified of DC. Discharge Summary and FL2 sent to facility. RN to call report prior to discharge [phone]).  Pt's son will transport to facility.   CSW will sign off for now as social work intervention is no longer needed. Please consult [place] again if new needs arise.   Final next level of care: Skilled Nursing Facility Barriers to Discharge: No Barriers Identified   Patient Goals and CMS Choice CMS Medicare.gov Compare Post Acute Care list provided to:: Patient Choice offered to / list presented to : Patient, Adult Children  Discharge Placement                Patient chooses bed at:  ([facility] (SNF)) Patient to be transferred to facility by: Son Cody Hines Name of family member notified: Son Cody Hines Patient and family notified of of transfer: 05/25/23  Discharge Plan and Services Additional resources added to the After Visit Summary for   In-house Referral: Clinical Social Work   Post Acute Care Choice: Skilled Nursing Facility                               Social Determinants of Health (SDOH) Interventions SDOH Screenings   Food Insecurity: No Food Insecurity (05/12/2023)  Housing: Low Risk  (05/12/2023)  Transportation Needs: No Transportation Needs (05/12/2023)  Utilities: Not At Risk (05/12/2023)  Depression (PHQ2-9): Low Risk  (03/28/2023)  Tobacco Use: Medium Risk (05/21/2023)     Readmission Risk Interventions     No data to display

## 2023-05-25 NOTE — Progress Notes (Signed)
PROGRESS NOTE        PATIENT DETAILS Name: Cody Hines. Age: 77 y.o. Sex: male Date of Birth: 1946-07-09 Admit Date: 05/12/2023 Admitting Physician Synetta Fail, MD [id number], [name], MD  Brief Summary: Patient is a 77 y.o.  male with history of HTN, HLD, PAF on Eliquis, cirrhosis, EtOH use, depression, mild cognitive impairment who was found by significant other confused (had not heard from him for 10 days-separated from wife).  He was found to have severe anemia, AKI, rhabdomyolysis, encephalopathy and subsequently admitted to the hospitalist service.  He was provided with supportive care-including IV fluids/blood transfusion/Rocephin nephrology and gastroenterology followed closely-paracentesis on 8/8 was consistent with SBP.    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  Significant microbiology data: 8/8>> peritoneal fluid culture: No growth  Procedures: 8/8>> paracentesis (WBC 1339 with 79% neutrophils) 05/19/2023.  Repeat ultrasound-guided paracentesis.  Improved and SBP chemistries. 05/21/23 EGD - Dr Rhea Belton  Impression:               - Normal esophagus.                           - Diffuse gastritis with contact oozing. Some                            component of portal hypertensive gastropathy.                            Biopsied.                           - No esophagal or gastric varices.                           - Mild scattered peptic duodenitis without bleeding.                           - Normal second portion of the duodenum. Moderate Sedation:      N/A Recommendation:           - Return patient to hospital ward for  ongoing care.                           - Resume previous diet.                           - Continue present medications. Daily BID PPI                            recommended now and at discharge.                           - Await pathology results.                           -  Office follow-up with Dr. Adela Lank after                            discharge.                           - Daily SBP prophylaxis per my recommendations in                            yesterday's note.                           - Repeat EGD in 2 years for variceal screening.                           - GI will sign off, but remain available. Please                            call if questions.   Consults: Nephrology GI  Subjective: Patient in bed, appears comfortable, denies any headache, no fever, no chest pain or pressure, no shortness of breath , no abdominal pain. No new focal weakness.  Objective: Vitals: Blood pressure (!) 116/55, pulse (!) 58, temperature 98.3 F (36.8 C), temperature source Oral, resp. rate 19, height 6\' 1"  (1.854 m), weight 103.2 kg, SpO2 95%.   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 AKI improving nephrology on board, also had developed some bladder outlet obstruction.  Improved with Foley Flomax as below.    Bladder outlet Obstruction - 1600c on [place] 05/19/23 - foley + flomax.  Rhabdomyolysis  Likely to be being down for the past several days at home, improved.    Normocytic anemia with some subacute GI blood loss with heme positive stool.  Etiology unclear-probably some element of AKI/acute illness contributing-could have had a transient GI bleed at home (history of cirrhosis)-does have heme positive stools and GI following, 4 units of packed RBC transfusion this admission, GI on board, unremarkable EGD on 05/21/2023 except for gastritis for which she will be placed on twice daily PPI with outpatient follow-up with Dr. Adela Lank postdischarge.  Acute metabolic encephalopathy - Likely due to AKI, resolved, Neuroimaging negative  Spontaneous bacterial peritonitis Had a benign abdominal exam-minimal ascites by physical exam S/p paracentesis on 8/8-consistent with SBP On IV Rocephin x 7 days followed by Bactrim prophylactic indefinitely per GI, once renal function improves will transition him to Bactrim for indefinite SBP prophylaxis. Follow cultures-but negative 05/19/23-day 5, repeat cultures from 05/19/2023 being monitored, cytology stable, GI following  History of alcoholic liver cirrhosis Supportive care Unclear if he still consumes alcohol Diuretics on hold Continue lactulose  HTN BP stable Losartan/Cardizem/metoprolol/diuretics remain on hold  PAF Sinus rhythm Amiodarone Eliquis was on hold due to GI bleed, EGD unremarkable, no signs of ongoing bleeding, cautiously restart Eliquis on 05/23/2023 and  monitor.  HLD Hold statin given rhabdo  OSA CPAP nightly when mentation better  Depression Hold Zoloft/lorazepam for now  Hypomagnesemia and hypokalemia.  Replaced.    Debility/deconditioning PT/OT/SLP eval May require SNF  BMI: Estimated body mass index is 30.02 kg/m as calculated from the following:   Height as of this encounter: 6\' 1"  (1.854 m).   Weight as of this encounter: 103.2 kg.   Code status:   Code Status: Full Code   DVT Prophylaxis: Place and maintain sequential compression device Start: 05/16/23 0800 SCDs Start:  05/12/23 1427 apixaban (ELIQUIS) tablet 5 mg    Family Communication: Son-Mitchell-[phone]-updated 8/9   Disposition Plan: Status is: Inpatient Remains inpatient appropriate because: Severity of illness   Planned Discharge Destination:Skilled nursing facility-when medically stable-currently not ready for discharge.   Diet: Diet Order             Diet 2 gram sodium Room service appropriate? Yes; Fluid consistency: Thin  Diet effective now                   MEDICATIONS: Scheduled Meds:  amiodarone  200 mg Oral Daily   apixaban  5 mg Oral BID   Chlorhexidine Gluconate Cloth  6 each Topical Daily   ferrous sulfate  325 mg Oral BID WC   folic acid  1 mg Oral Daily   furosemide  40 mg Intravenous Q12H   midodrine  10 mg Oral BID WC   pantoprazole  40 mg Oral BID   potassium chloride  40 mEq Oral BID   sodium chloride flush  3 mL Intravenous Q12H   tamsulosin  0.4 mg Oral Daily   Continuous Infusions:  cefTRIAXone (ROCEPHIN)  IV 2 g (05/24/23 1053)   lactated ringers Stopped (05/22/23 1647)   PRN Meds:.acetaminophen **OR** acetaminophen, naLOXone (NARCAN)  injection   I have personally reviewed following labs and imaging studies  LABORATORY DATA:  Recent Labs  Lab 05/21/23 0657 05/22/23 0452 05/23/23 0045 05/24/23 0130 05/25/23 0209  WBC 7.4 6.7 6.3 6.2 5.3  HGB 10.1* 9.6* 10.4* 9.9* 10.4*  HCT 31.3* 29.1* 32.1* 31.6* 32.6*  PLT 104* 102* 100* 108* 105*  MCV 86.5 87.9 88.9 87.5 90.1  MCH 27.9 29.0 28.8 27.4 28.7  MCHC 32.3 33.0 32.4 31.3 31.9  RDW 15.6* 15.8* 15.6* 15.8* 16.0*  LYMPHSABS 1.3 1.5 1.2 1.2 1.4  MONOABS 0.6 0.5 0.5 0.4 0.4  EOSABS 0.2 0.2 0.3 0.3 0.3  BASOSABS 0.0 0.0 0.0 0.0 0.0    Recent Labs  Lab 05/19/23 0639 05/20/23 0020 05/21/23 0228 05/22/23 0452 05/23/23 0045 05/24/23 0130 05/25/23 0209  NA 139 138 139 138 135 136 137  K 3.2* 3.3* 3.8 3.4* 3.4* 3.8 3.7  CL 104 102 102 102 97* 100 102  CO2 22 22 24 25 25 25 25    ANIONGAP 13 14 13 11 13 11 10   GLUCOSE 116* 147* 120* 107* 115* 114* 146*  BUN 60* 60* 49* 42* 36* 33* 31*  CREATININE 8.36* 8.11* 6.77* 5.49* 4.98* 4.38* 4.06*  ALBUMIN 3.2* 2.9* 3.0* 3.0* 3.1* 3.0* 3.0*  CRP 6.5*  --   --   --   --   --   --   PROCALCITON 0.23 0.22  --   --   --   --   --   BNP  --   --   --  289.4* 373.7* 239.8* 154.8*  MG  --   --   --  1.5* 2.1 1.8  1.9  CALCIUM 8.6* 8.4* 8.4* 8.4* 8.6* 8.5* 8.7*   MICROBIOLOGY: Recent Results (from the past 240 hour(s))  Culture, body fluid w Gram Stain-bottle     Status: None   Collection Time: 05/19/23  9:46 AM   Specimen: Peritoneal Washings  Result Value Ref Range Status   Specimen Description PERITONEAL  Final   Special Requests NONE  Final   Culture   Final    NO GROWTH 5 DAYS Performed at [facility], [address] [place], [place] [address]    Report Status 05/24/2023 FINAL  Final  Gram stain     Status: None   Collection Time: 05/19/23  9:46 AM   Specimen: Peritoneal Washings  Result Value Ref Range Status   Specimen Description PERITONEAL  Final   Special Requests NONE  Final   Gram Stain   Final    NO WBC SEEN NO ORGANISMS SEEN Performed at [facility], [address] [place], [place] [address]    Report Status 05/19/2023 FINAL  Final    RADIOLOGY STUDIES/RESULTS: No results found.   LOS: 13 days   Signature  -    Susa Raring M.D on 05/25/2023 at 8:29 AM   -  To page go to [email or web address]

## 2023-05-25 NOTE — Discharge Summary (Signed)
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   Recent Labs  Lab 05/21/23 0657 05/22/23 0452 05/23/23 0045 05/24/23 0130 05/25/23 0209  WBC 7.4 6.7 6.3 6.2 5.3  HGB 10.1* 9.6* 10.4* 9.9* 10.4*  HCT 31.3* 29.1* 32.1* 31.6* 32.6*  PLT 104* 102* 100* 108* 105*  MCV 86.5 87.9 88.9 87.5 90.1  MCH 27.9 29.0 28.8 27.4 28.7  MCHC 32.3 33.0 32.4 31.3 31.9  RDW 15.6* 15.8* 15.6* 15.8* 16.0*  LYMPHSABS 1.3 1.5 1.2 1.2 1.4  MONOABS 0.6 0.5 0.5 0.4 0.4  EOSABS 0.2 0.2 0.3 0.3 0.3  BASOSABS 0.0 0.0 0.0 0.0 0.0    Recent Labs  Lab 05/19/23 0639 05/20/23 0020 05/21/23 0228 05/22/23 0452 05/23/23 0045 05/24/23 0130 05/25/23 0209  NA 139 138 139 138 135 136 137  K 3.2* 3.3* 3.8 3.4* 3.4* 3.8 3.7  CL 104 102 102 102 97* 100  102  CO2 22 22 24 25 25 25 25   ANIONGAP 13 14 13 11 13 11 10   GLUCOSE 116* 147* 120* 107* 115* 114* 146*  BUN 60* 60* 49* 42* 36* 33* 31*  CREATININE 8.36* 8.11* 6.77* 5.49* 4.98* 4.38* 4.06*  ALBUMIN 3.2* 2.9* 3.0* 3.0* 3.1* 3.0* 3.0*  CRP 6.5*  --   --   --   --   --   --   PROCALCITON 0.23 0.22  --   --   --   --   --   BNP  --   --   --  289.4* 373.7* 239.8* 154.8*  MG  --   --   --  1.5* 2.1 1.8 1.9  CALCIUM 8.6* 8.4* 8.4* 8.4* 8.6* 8.5* 8.7*    Total Time in preparing paper work, data evaluation and todays exam -  35 minutes  Signature  -    Susa Raring M.D on 05/25/2023 at 9:24 AM   -  To page go to [email or web address]

## 2023-05-25 NOTE — Plan of Care (Signed)

## 2023-05-25 NOTE — Progress Notes (Signed)
Report given to RN at [facility].

## 2023-06-02 ENCOUNTER — Ambulatory Visit: Payer: Medicare Other | Admitting: Psychiatry

## 2023-06-12 ENCOUNTER — Ambulatory Visit: Payer: Medicare Other | Admitting: Nurse Practitioner

## 2023-06-15 ENCOUNTER — Encounter: Payer: Self-pay | Admitting: Internal Medicine

## 2023-06-18 ENCOUNTER — Ambulatory Visit: Payer: Medicare Other | Admitting: Adult Health

## 2023-07-06 ENCOUNTER — Ambulatory Visit: Payer: Medicare Other | Admitting: Psychiatry

## 2023-07-21 ENCOUNTER — Telehealth: Payer: Self-pay | Admitting: Nurse Practitioner

## 2023-07-21 NOTE — Telephone Encounter (Signed)
Wanted to document-- I tried to call PT to set up a hospital follow-up on the following dates, but no one responded: 9/30, 10/1, 10/2, 10/7. Today is 10/15. It has been over the 14-day period.

## 2023-08-07 ENCOUNTER — Encounter: Payer: Self-pay | Admitting: Internal Medicine

## 2023-08-19 ENCOUNTER — Encounter: Payer: Self-pay | Admitting: Internal Medicine

## 2023-08-26 ENCOUNTER — Encounter: Payer: Self-pay | Admitting: Internal Medicine

## 2023-09-06 DEATH — deceased

## 2023-09-15 ENCOUNTER — Ambulatory Visit: Payer: Medicare Other | Admitting: Internal Medicine

## 2023-12-16 ENCOUNTER — Ambulatory Visit: Payer: Medicare Other | Admitting: Nurse Practitioner

## 2024-03-23 ENCOUNTER — Encounter: Payer: Medicare Other | Admitting: Internal Medicine
